# Patient Record
Sex: Male | Born: 1940 | Race: Black or African American | Hispanic: No | Smoking: Never smoker
Health system: Southern US, Community
[De-identification: ages and names within clinical notes are randomized; demographics above are authoritative.]

## PROBLEM LIST (undated history)

## (undated) DIAGNOSIS — I2510 Atherosclerotic heart disease of native coronary artery without angina pectoris: Secondary | ICD-10-CM

## (undated) DIAGNOSIS — E119 Type 2 diabetes mellitus without complications: Secondary | ICD-10-CM

## (undated) DIAGNOSIS — I4510 Unspecified right bundle-branch block: Secondary | ICD-10-CM

## (undated) DIAGNOSIS — I10 Essential (primary) hypertension: Secondary | ICD-10-CM

## (undated) DIAGNOSIS — E785 Hyperlipidemia, unspecified: Secondary | ICD-10-CM

## (undated) DIAGNOSIS — K5790 Diverticulosis of intestine, part unspecified, without perforation or abscess without bleeding: Secondary | ICD-10-CM

## (undated) DIAGNOSIS — I509 Heart failure, unspecified: Secondary | ICD-10-CM

## (undated) DIAGNOSIS — K649 Unspecified hemorrhoids: Secondary | ICD-10-CM

## (undated) DIAGNOSIS — D471 Chronic myeloproliferative disease: Secondary | ICD-10-CM

## (undated) DIAGNOSIS — I739 Peripheral vascular disease, unspecified: Secondary | ICD-10-CM

## (undated) HISTORY — DX: Unspecified hemorrhoids: K64.9

## (undated) HISTORY — PX: VEIN SURGERY: SHX48

## (undated) HISTORY — DX: Heart failure, unspecified: I50.9

## (undated) HISTORY — DX: Peripheral vascular disease, unspecified: I73.9

## (undated) HISTORY — DX: Essential (primary) hypertension: I10

## (undated) HISTORY — DX: Diverticulosis of intestine, part unspecified, without perforation or abscess without bleeding: K57.90

## (undated) HISTORY — DX: Hyperlipidemia, unspecified: E78.5

## (undated) HISTORY — DX: Chronic myeloproliferative disease: D47.1

## (undated) HISTORY — DX: Type 2 diabetes mellitus without complications: E11.9

## (undated) HISTORY — DX: Atherosclerotic heart disease of native coronary artery without angina pectoris: I25.10

---

## 1998-10-08 HISTORY — PX: CORONARY ARTERY BYPASS GRAFT: SHX141

## 2010-10-27 ENCOUNTER — Emergency Department (HOSPITAL_COMMUNITY)
Admission: EM | Admit: 2010-10-27 | Discharge: 2010-10-27 | Payer: Self-pay | Source: Home / Self Care | Admitting: Emergency Medicine

## 2011-10-13 DIAGNOSIS — E1159 Type 2 diabetes mellitus with other circulatory complications: Secondary | ICD-10-CM | POA: Diagnosis not present

## 2011-10-13 DIAGNOSIS — L608 Other nail disorders: Secondary | ICD-10-CM | POA: Diagnosis not present

## 2011-10-13 DIAGNOSIS — B351 Tinea unguium: Secondary | ICD-10-CM | POA: Diagnosis not present

## 2011-10-22 DIAGNOSIS — G894 Chronic pain syndrome: Secondary | ICD-10-CM | POA: Diagnosis not present

## 2011-10-22 DIAGNOSIS — E119 Type 2 diabetes mellitus without complications: Secondary | ICD-10-CM | POA: Diagnosis not present

## 2011-10-22 DIAGNOSIS — E785 Hyperlipidemia, unspecified: Secondary | ICD-10-CM | POA: Diagnosis not present

## 2011-10-22 DIAGNOSIS — H40019 Open angle with borderline findings, low risk, unspecified eye: Secondary | ICD-10-CM | POA: Diagnosis not present

## 2011-10-22 DIAGNOSIS — K219 Gastro-esophageal reflux disease without esophagitis: Secondary | ICD-10-CM | POA: Diagnosis not present

## 2011-11-08 DIAGNOSIS — H1045 Other chronic allergic conjunctivitis: Secondary | ICD-10-CM | POA: Diagnosis not present

## 2011-11-19 DIAGNOSIS — G5790 Unspecified mononeuropathy of unspecified lower limb: Secondary | ICD-10-CM | POA: Diagnosis not present

## 2011-11-19 DIAGNOSIS — G609 Hereditary and idiopathic neuropathy, unspecified: Secondary | ICD-10-CM | POA: Diagnosis not present

## 2011-11-19 DIAGNOSIS — M79609 Pain in unspecified limb: Secondary | ICD-10-CM | POA: Diagnosis not present

## 2011-12-15 DIAGNOSIS — B351 Tinea unguium: Secondary | ICD-10-CM | POA: Diagnosis not present

## 2011-12-15 DIAGNOSIS — E1159 Type 2 diabetes mellitus with other circulatory complications: Secondary | ICD-10-CM | POA: Diagnosis not present

## 2011-12-15 DIAGNOSIS — L608 Other nail disorders: Secondary | ICD-10-CM | POA: Diagnosis not present

## 2011-12-28 DIAGNOSIS — J309 Allergic rhinitis, unspecified: Secondary | ICD-10-CM | POA: Diagnosis not present

## 2011-12-28 DIAGNOSIS — E559 Vitamin D deficiency, unspecified: Secondary | ICD-10-CM | POA: Diagnosis not present

## 2011-12-28 DIAGNOSIS — I10 Essential (primary) hypertension: Secondary | ICD-10-CM | POA: Diagnosis not present

## 2011-12-28 DIAGNOSIS — I2510 Atherosclerotic heart disease of native coronary artery without angina pectoris: Secondary | ICD-10-CM | POA: Diagnosis not present

## 2011-12-28 DIAGNOSIS — E119 Type 2 diabetes mellitus without complications: Secondary | ICD-10-CM | POA: Diagnosis not present

## 2012-02-16 DIAGNOSIS — L608 Other nail disorders: Secondary | ICD-10-CM | POA: Diagnosis not present

## 2012-02-16 DIAGNOSIS — B351 Tinea unguium: Secondary | ICD-10-CM | POA: Diagnosis not present

## 2012-02-16 DIAGNOSIS — E1159 Type 2 diabetes mellitus with other circulatory complications: Secondary | ICD-10-CM | POA: Diagnosis not present

## 2012-03-10 DIAGNOSIS — E119 Type 2 diabetes mellitus without complications: Secondary | ICD-10-CM | POA: Diagnosis not present

## 2012-03-10 DIAGNOSIS — E785 Hyperlipidemia, unspecified: Secondary | ICD-10-CM | POA: Diagnosis not present

## 2012-03-10 DIAGNOSIS — I10 Essential (primary) hypertension: Secondary | ICD-10-CM | POA: Diagnosis not present

## 2012-03-10 DIAGNOSIS — K219 Gastro-esophageal reflux disease without esophagitis: Secondary | ICD-10-CM | POA: Diagnosis not present

## 2012-04-09 DIAGNOSIS — I2510 Atherosclerotic heart disease of native coronary artery without angina pectoris: Secondary | ICD-10-CM | POA: Diagnosis not present

## 2012-04-09 DIAGNOSIS — I10 Essential (primary) hypertension: Secondary | ICD-10-CM | POA: Diagnosis not present

## 2012-04-09 DIAGNOSIS — E119 Type 2 diabetes mellitus without complications: Secondary | ICD-10-CM | POA: Diagnosis not present

## 2012-04-09 DIAGNOSIS — E785 Hyperlipidemia, unspecified: Secondary | ICD-10-CM | POA: Diagnosis not present

## 2012-04-19 DIAGNOSIS — B351 Tinea unguium: Secondary | ICD-10-CM | POA: Diagnosis not present

## 2012-04-19 DIAGNOSIS — E1159 Type 2 diabetes mellitus with other circulatory complications: Secondary | ICD-10-CM | POA: Diagnosis not present

## 2012-04-19 DIAGNOSIS — L608 Other nail disorders: Secondary | ICD-10-CM | POA: Diagnosis not present

## 2012-04-22 DIAGNOSIS — I8310 Varicose veins of unspecified lower extremity with inflammation: Secondary | ICD-10-CM | POA: Diagnosis not present

## 2012-04-22 DIAGNOSIS — I872 Venous insufficiency (chronic) (peripheral): Secondary | ICD-10-CM | POA: Diagnosis not present

## 2012-04-22 DIAGNOSIS — M79609 Pain in unspecified limb: Secondary | ICD-10-CM | POA: Diagnosis not present

## 2012-04-22 DIAGNOSIS — I87309 Chronic venous hypertension (idiopathic) without complications of unspecified lower extremity: Secondary | ICD-10-CM | POA: Diagnosis not present

## 2012-04-22 DIAGNOSIS — M7989 Other specified soft tissue disorders: Secondary | ICD-10-CM | POA: Diagnosis not present

## 2012-06-10 DIAGNOSIS — I10 Essential (primary) hypertension: Secondary | ICD-10-CM | POA: Diagnosis not present

## 2012-06-10 DIAGNOSIS — R109 Unspecified abdominal pain: Secondary | ICD-10-CM | POA: Diagnosis not present

## 2012-06-10 DIAGNOSIS — E119 Type 2 diabetes mellitus without complications: Secondary | ICD-10-CM | POA: Diagnosis not present

## 2012-06-10 DIAGNOSIS — Z79899 Other long term (current) drug therapy: Secondary | ICD-10-CM | POA: Diagnosis not present

## 2012-06-10 DIAGNOSIS — R112 Nausea with vomiting, unspecified: Secondary | ICD-10-CM | POA: Diagnosis not present

## 2012-06-10 DIAGNOSIS — K625 Hemorrhage of anus and rectum: Secondary | ICD-10-CM | POA: Diagnosis not present

## 2012-06-10 DIAGNOSIS — D45 Polycythemia vera: Secondary | ICD-10-CM | POA: Diagnosis not present

## 2012-06-10 DIAGNOSIS — Z8711 Personal history of peptic ulcer disease: Secondary | ICD-10-CM | POA: Diagnosis not present

## 2012-06-10 DIAGNOSIS — Z951 Presence of aortocoronary bypass graft: Secondary | ICD-10-CM | POA: Diagnosis not present

## 2012-06-10 DIAGNOSIS — K922 Gastrointestinal hemorrhage, unspecified: Secondary | ICD-10-CM | POA: Diagnosis not present

## 2012-06-13 DIAGNOSIS — E119 Type 2 diabetes mellitus without complications: Secondary | ICD-10-CM | POA: Diagnosis not present

## 2012-06-13 DIAGNOSIS — E785 Hyperlipidemia, unspecified: Secondary | ICD-10-CM | POA: Diagnosis not present

## 2012-06-13 DIAGNOSIS — D538 Other specified nutritional anemias: Secondary | ICD-10-CM | POA: Diagnosis not present

## 2012-06-13 DIAGNOSIS — I2510 Atherosclerotic heart disease of native coronary artery without angina pectoris: Secondary | ICD-10-CM | POA: Diagnosis not present

## 2012-06-17 DIAGNOSIS — D509 Iron deficiency anemia, unspecified: Secondary | ICD-10-CM | POA: Diagnosis not present

## 2012-06-17 DIAGNOSIS — K922 Gastrointestinal hemorrhage, unspecified: Secondary | ICD-10-CM | POA: Diagnosis not present

## 2012-06-17 DIAGNOSIS — Z1211 Encounter for screening for malignant neoplasm of colon: Secondary | ICD-10-CM | POA: Diagnosis not present

## 2012-06-17 DIAGNOSIS — D500 Iron deficiency anemia secondary to blood loss (chronic): Secondary | ICD-10-CM | POA: Diagnosis not present

## 2012-06-19 DIAGNOSIS — Z951 Presence of aortocoronary bypass graft: Secondary | ICD-10-CM | POA: Diagnosis not present

## 2012-06-19 DIAGNOSIS — K259 Gastric ulcer, unspecified as acute or chronic, without hemorrhage or perforation: Secondary | ICD-10-CM | POA: Diagnosis not present

## 2012-06-19 DIAGNOSIS — I10 Essential (primary) hypertension: Secondary | ICD-10-CM | POA: Diagnosis not present

## 2012-06-19 DIAGNOSIS — D649 Anemia, unspecified: Secondary | ICD-10-CM | POA: Diagnosis not present

## 2012-06-19 DIAGNOSIS — K649 Unspecified hemorrhoids: Secondary | ICD-10-CM | POA: Diagnosis not present

## 2012-06-19 DIAGNOSIS — I739 Peripheral vascular disease, unspecified: Secondary | ICD-10-CM | POA: Diagnosis not present

## 2012-06-19 DIAGNOSIS — A048 Other specified bacterial intestinal infections: Secondary | ICD-10-CM | POA: Diagnosis not present

## 2012-06-19 DIAGNOSIS — K921 Melena: Secondary | ICD-10-CM | POA: Diagnosis not present

## 2012-06-19 DIAGNOSIS — K5730 Diverticulosis of large intestine without perforation or abscess without bleeding: Secondary | ICD-10-CM | POA: Diagnosis not present

## 2012-06-19 DIAGNOSIS — Z794 Long term (current) use of insulin: Secondary | ICD-10-CM | POA: Diagnosis not present

## 2012-06-19 DIAGNOSIS — K260 Acute duodenal ulcer with hemorrhage: Secondary | ICD-10-CM | POA: Diagnosis not present

## 2012-06-19 DIAGNOSIS — K2940 Chronic atrophic gastritis without bleeding: Secondary | ICD-10-CM | POA: Diagnosis not present

## 2012-06-19 DIAGNOSIS — I2510 Atherosclerotic heart disease of native coronary artery without angina pectoris: Secondary | ICD-10-CM | POA: Diagnosis not present

## 2012-06-19 DIAGNOSIS — E119 Type 2 diabetes mellitus without complications: Secondary | ICD-10-CM | POA: Diagnosis not present

## 2012-06-21 DIAGNOSIS — E1159 Type 2 diabetes mellitus with other circulatory complications: Secondary | ICD-10-CM | POA: Diagnosis not present

## 2012-06-21 DIAGNOSIS — L608 Other nail disorders: Secondary | ICD-10-CM | POA: Diagnosis not present

## 2012-06-21 DIAGNOSIS — B351 Tinea unguium: Secondary | ICD-10-CM | POA: Diagnosis not present

## 2012-06-24 DIAGNOSIS — K922 Gastrointestinal hemorrhage, unspecified: Secondary | ICD-10-CM | POA: Diagnosis not present

## 2012-06-24 DIAGNOSIS — D500 Iron deficiency anemia secondary to blood loss (chronic): Secondary | ICD-10-CM | POA: Diagnosis not present

## 2012-06-24 DIAGNOSIS — Z1211 Encounter for screening for malignant neoplasm of colon: Secondary | ICD-10-CM | POA: Diagnosis not present

## 2012-07-10 DIAGNOSIS — I517 Cardiomegaly: Secondary | ICD-10-CM | POA: Diagnosis not present

## 2012-07-10 DIAGNOSIS — E119 Type 2 diabetes mellitus without complications: Secondary | ICD-10-CM | POA: Diagnosis not present

## 2012-07-10 DIAGNOSIS — E785 Hyperlipidemia, unspecified: Secondary | ICD-10-CM | POA: Diagnosis not present

## 2012-07-10 DIAGNOSIS — I10 Essential (primary) hypertension: Secondary | ICD-10-CM | POA: Diagnosis not present

## 2012-07-22 DIAGNOSIS — I872 Venous insufficiency (chronic) (peripheral): Secondary | ICD-10-CM | POA: Diagnosis not present

## 2012-07-22 DIAGNOSIS — I8310 Varicose veins of unspecified lower extremity with inflammation: Secondary | ICD-10-CM | POA: Diagnosis not present

## 2012-07-31 DIAGNOSIS — Z951 Presence of aortocoronary bypass graft: Secondary | ICD-10-CM | POA: Diagnosis not present

## 2012-07-31 DIAGNOSIS — I10 Essential (primary) hypertension: Secondary | ICD-10-CM | POA: Diagnosis not present

## 2012-07-31 DIAGNOSIS — K279 Peptic ulcer, site unspecified, unspecified as acute or chronic, without hemorrhage or perforation: Secondary | ICD-10-CM | POA: Diagnosis not present

## 2012-07-31 DIAGNOSIS — A048 Other specified bacterial intestinal infections: Secondary | ICD-10-CM | POA: Diagnosis not present

## 2012-07-31 DIAGNOSIS — E119 Type 2 diabetes mellitus without complications: Secondary | ICD-10-CM | POA: Diagnosis not present

## 2012-07-31 DIAGNOSIS — K2990 Gastroduodenitis, unspecified, without bleeding: Secondary | ICD-10-CM | POA: Diagnosis not present

## 2012-07-31 DIAGNOSIS — K277 Chronic peptic ulcer, site unspecified, without hemorrhage or perforation: Secondary | ICD-10-CM | POA: Diagnosis not present

## 2012-07-31 DIAGNOSIS — K2970 Gastritis, unspecified, without bleeding: Secondary | ICD-10-CM | POA: Diagnosis not present

## 2012-07-31 DIAGNOSIS — K2940 Chronic atrophic gastritis without bleeding: Secondary | ICD-10-CM | POA: Diagnosis not present

## 2012-08-07 DIAGNOSIS — E119 Type 2 diabetes mellitus without complications: Secondary | ICD-10-CM | POA: Diagnosis not present

## 2012-08-07 DIAGNOSIS — I739 Peripheral vascular disease, unspecified: Secondary | ICD-10-CM | POA: Diagnosis not present

## 2012-08-07 DIAGNOSIS — I10 Essential (primary) hypertension: Secondary | ICD-10-CM | POA: Diagnosis not present

## 2012-08-07 DIAGNOSIS — E785 Hyperlipidemia, unspecified: Secondary | ICD-10-CM | POA: Diagnosis not present

## 2012-08-23 DIAGNOSIS — E1159 Type 2 diabetes mellitus with other circulatory complications: Secondary | ICD-10-CM | POA: Diagnosis not present

## 2012-08-23 DIAGNOSIS — L608 Other nail disorders: Secondary | ICD-10-CM | POA: Diagnosis not present

## 2012-08-23 DIAGNOSIS — B351 Tinea unguium: Secondary | ICD-10-CM | POA: Diagnosis not present

## 2012-09-09 DIAGNOSIS — I83893 Varicose veins of bilateral lower extremities with other complications: Secondary | ICD-10-CM | POA: Diagnosis not present

## 2012-09-11 DIAGNOSIS — I83893 Varicose veins of bilateral lower extremities with other complications: Secondary | ICD-10-CM | POA: Diagnosis not present

## 2012-09-11 DIAGNOSIS — I872 Venous insufficiency (chronic) (peripheral): Secondary | ICD-10-CM | POA: Diagnosis not present

## 2012-09-11 DIAGNOSIS — I87309 Chronic venous hypertension (idiopathic) without complications of unspecified lower extremity: Secondary | ICD-10-CM | POA: Diagnosis not present

## 2012-09-16 DIAGNOSIS — I87309 Chronic venous hypertension (idiopathic) without complications of unspecified lower extremity: Secondary | ICD-10-CM | POA: Diagnosis not present

## 2012-09-16 DIAGNOSIS — I83893 Varicose veins of bilateral lower extremities with other complications: Secondary | ICD-10-CM | POA: Diagnosis not present

## 2012-09-23 DIAGNOSIS — R5381 Other malaise: Secondary | ICD-10-CM | POA: Diagnosis not present

## 2012-09-23 DIAGNOSIS — I10 Essential (primary) hypertension: Secondary | ICD-10-CM | POA: Diagnosis not present

## 2012-09-23 DIAGNOSIS — E785 Hyperlipidemia, unspecified: Secondary | ICD-10-CM | POA: Diagnosis not present

## 2012-09-23 DIAGNOSIS — E119 Type 2 diabetes mellitus without complications: Secondary | ICD-10-CM | POA: Diagnosis not present

## 2012-09-25 DIAGNOSIS — G4730 Sleep apnea, unspecified: Secondary | ICD-10-CM | POA: Diagnosis not present

## 2012-09-25 DIAGNOSIS — K2980 Duodenitis without bleeding: Secondary | ICD-10-CM | POA: Diagnosis not present

## 2012-09-25 DIAGNOSIS — I10 Essential (primary) hypertension: Secondary | ICD-10-CM | POA: Diagnosis not present

## 2012-09-25 DIAGNOSIS — K277 Chronic peptic ulcer, site unspecified, without hemorrhage or perforation: Secondary | ICD-10-CM | POA: Diagnosis not present

## 2012-09-25 DIAGNOSIS — I252 Old myocardial infarction: Secondary | ICD-10-CM | POA: Diagnosis not present

## 2012-09-25 DIAGNOSIS — A048 Other specified bacterial intestinal infections: Secondary | ICD-10-CM | POA: Diagnosis not present

## 2012-09-25 DIAGNOSIS — Z5181 Encounter for therapeutic drug level monitoring: Secondary | ICD-10-CM | POA: Diagnosis not present

## 2012-09-25 DIAGNOSIS — K279 Peptic ulcer, site unspecified, unspecified as acute or chronic, without hemorrhage or perforation: Secondary | ICD-10-CM | POA: Diagnosis not present

## 2012-09-25 DIAGNOSIS — K2940 Chronic atrophic gastritis without bleeding: Secondary | ICD-10-CM | POA: Diagnosis not present

## 2012-09-25 DIAGNOSIS — I872 Venous insufficiency (chronic) (peripheral): Secondary | ICD-10-CM | POA: Diagnosis not present

## 2012-09-25 DIAGNOSIS — I8310 Varicose veins of unspecified lower extremity with inflammation: Secondary | ICD-10-CM | POA: Diagnosis not present

## 2012-09-30 DIAGNOSIS — I87309 Chronic venous hypertension (idiopathic) without complications of unspecified lower extremity: Secondary | ICD-10-CM | POA: Diagnosis not present

## 2012-09-30 DIAGNOSIS — K277 Chronic peptic ulcer, site unspecified, without hemorrhage or perforation: Secondary | ICD-10-CM | POA: Diagnosis not present

## 2012-09-30 DIAGNOSIS — I83893 Varicose veins of bilateral lower extremities with other complications: Secondary | ICD-10-CM | POA: Diagnosis not present

## 2012-10-02 DIAGNOSIS — M79609 Pain in unspecified limb: Secondary | ICD-10-CM | POA: Diagnosis not present

## 2012-10-25 DIAGNOSIS — E1159 Type 2 diabetes mellitus with other circulatory complications: Secondary | ICD-10-CM | POA: Diagnosis not present

## 2012-10-25 DIAGNOSIS — B351 Tinea unguium: Secondary | ICD-10-CM | POA: Diagnosis not present

## 2012-10-25 DIAGNOSIS — L608 Other nail disorders: Secondary | ICD-10-CM | POA: Diagnosis not present

## 2012-10-26 DIAGNOSIS — E1149 Type 2 diabetes mellitus with other diabetic neurological complication: Secondary | ICD-10-CM | POA: Diagnosis not present

## 2012-10-26 DIAGNOSIS — G609 Hereditary and idiopathic neuropathy, unspecified: Secondary | ICD-10-CM | POA: Diagnosis not present

## 2012-10-26 DIAGNOSIS — Z951 Presence of aortocoronary bypass graft: Secondary | ICD-10-CM | POA: Diagnosis not present

## 2012-10-26 DIAGNOSIS — E119 Type 2 diabetes mellitus without complications: Secondary | ICD-10-CM | POA: Diagnosis not present

## 2012-10-26 DIAGNOSIS — E1142 Type 2 diabetes mellitus with diabetic polyneuropathy: Secondary | ICD-10-CM | POA: Diagnosis not present

## 2012-10-26 DIAGNOSIS — I10 Essential (primary) hypertension: Secondary | ICD-10-CM | POA: Diagnosis not present

## 2012-10-31 DIAGNOSIS — I739 Peripheral vascular disease, unspecified: Secondary | ICD-10-CM | POA: Diagnosis not present

## 2012-10-31 DIAGNOSIS — E119 Type 2 diabetes mellitus without complications: Secondary | ICD-10-CM | POA: Diagnosis not present

## 2012-10-31 DIAGNOSIS — E785 Hyperlipidemia, unspecified: Secondary | ICD-10-CM | POA: Diagnosis not present

## 2012-10-31 DIAGNOSIS — I10 Essential (primary) hypertension: Secondary | ICD-10-CM | POA: Diagnosis not present

## 2012-12-02 DIAGNOSIS — D649 Anemia, unspecified: Secondary | ICD-10-CM | POA: Diagnosis not present

## 2012-12-02 DIAGNOSIS — K277 Chronic peptic ulcer, site unspecified, without hemorrhage or perforation: Secondary | ICD-10-CM | POA: Diagnosis not present

## 2012-12-02 DIAGNOSIS — I872 Venous insufficiency (chronic) (peripheral): Secondary | ICD-10-CM | POA: Diagnosis not present

## 2012-12-02 DIAGNOSIS — M79609 Pain in unspecified limb: Secondary | ICD-10-CM | POA: Diagnosis not present

## 2012-12-02 DIAGNOSIS — I87309 Chronic venous hypertension (idiopathic) without complications of unspecified lower extremity: Secondary | ICD-10-CM | POA: Diagnosis not present

## 2012-12-02 DIAGNOSIS — I8310 Varicose veins of unspecified lower extremity with inflammation: Secondary | ICD-10-CM | POA: Diagnosis not present

## 2012-12-15 DIAGNOSIS — E119 Type 2 diabetes mellitus without complications: Secondary | ICD-10-CM | POA: Diagnosis not present

## 2012-12-15 DIAGNOSIS — I10 Essential (primary) hypertension: Secondary | ICD-10-CM | POA: Diagnosis not present

## 2012-12-15 DIAGNOSIS — E785 Hyperlipidemia, unspecified: Secondary | ICD-10-CM | POA: Diagnosis not present

## 2012-12-15 DIAGNOSIS — I509 Heart failure, unspecified: Secondary | ICD-10-CM | POA: Diagnosis not present

## 2012-12-18 DIAGNOSIS — K219 Gastro-esophageal reflux disease without esophagitis: Secondary | ICD-10-CM | POA: Diagnosis not present

## 2012-12-18 DIAGNOSIS — H04129 Dry eye syndrome of unspecified lacrimal gland: Secondary | ICD-10-CM | POA: Diagnosis not present

## 2012-12-18 DIAGNOSIS — K2970 Gastritis, unspecified, without bleeding: Secondary | ICD-10-CM | POA: Diagnosis not present

## 2012-12-18 DIAGNOSIS — Z951 Presence of aortocoronary bypass graft: Secondary | ICD-10-CM | POA: Diagnosis not present

## 2012-12-18 DIAGNOSIS — H40019 Open angle with borderline findings, low risk, unspecified eye: Secondary | ICD-10-CM | POA: Diagnosis not present

## 2012-12-18 DIAGNOSIS — I83893 Varicose veins of bilateral lower extremities with other complications: Secondary | ICD-10-CM | POA: Diagnosis not present

## 2012-12-18 DIAGNOSIS — I10 Essential (primary) hypertension: Secondary | ICD-10-CM | POA: Diagnosis not present

## 2012-12-18 DIAGNOSIS — I872 Venous insufficiency (chronic) (peripheral): Secondary | ICD-10-CM | POA: Diagnosis not present

## 2012-12-18 DIAGNOSIS — I252 Old myocardial infarction: Secondary | ICD-10-CM | POA: Diagnosis not present

## 2012-12-18 DIAGNOSIS — K277 Chronic peptic ulcer, site unspecified, without hemorrhage or perforation: Secondary | ICD-10-CM | POA: Diagnosis not present

## 2012-12-18 DIAGNOSIS — A048 Other specified bacterial intestinal infections: Secondary | ICD-10-CM | POA: Diagnosis not present

## 2012-12-18 DIAGNOSIS — Z5181 Encounter for therapeutic drug level monitoring: Secondary | ICD-10-CM | POA: Diagnosis not present

## 2012-12-18 DIAGNOSIS — K2900 Acute gastritis without bleeding: Secondary | ICD-10-CM | POA: Diagnosis not present

## 2012-12-18 DIAGNOSIS — G4730 Sleep apnea, unspecified: Secondary | ICD-10-CM | POA: Diagnosis not present

## 2012-12-18 DIAGNOSIS — E119 Type 2 diabetes mellitus without complications: Secondary | ICD-10-CM | POA: Diagnosis not present

## 2012-12-18 DIAGNOSIS — K2940 Chronic atrophic gastritis without bleeding: Secondary | ICD-10-CM | POA: Diagnosis not present

## 2012-12-18 DIAGNOSIS — Z794 Long term (current) use of insulin: Secondary | ICD-10-CM | POA: Diagnosis not present

## 2012-12-23 DIAGNOSIS — D649 Anemia, unspecified: Secondary | ICD-10-CM | POA: Diagnosis not present

## 2012-12-23 DIAGNOSIS — K277 Chronic peptic ulcer, site unspecified, without hemorrhage or perforation: Secondary | ICD-10-CM | POA: Diagnosis not present

## 2012-12-25 DIAGNOSIS — I87309 Chronic venous hypertension (idiopathic) without complications of unspecified lower extremity: Secondary | ICD-10-CM | POA: Diagnosis not present

## 2012-12-26 DIAGNOSIS — I10 Essential (primary) hypertension: Secondary | ICD-10-CM | POA: Diagnosis not present

## 2012-12-26 DIAGNOSIS — D750 Familial erythrocytosis: Secondary | ICD-10-CM | POA: Diagnosis not present

## 2012-12-26 DIAGNOSIS — D473 Essential (hemorrhagic) thrombocythemia: Secondary | ICD-10-CM | POA: Diagnosis not present

## 2012-12-26 DIAGNOSIS — D759 Disease of blood and blood-forming organs, unspecified: Secondary | ICD-10-CM | POA: Diagnosis not present

## 2012-12-26 DIAGNOSIS — D509 Iron deficiency anemia, unspecified: Secondary | ICD-10-CM | POA: Diagnosis not present

## 2012-12-26 DIAGNOSIS — D7581 Myelofibrosis: Secondary | ICD-10-CM | POA: Diagnosis not present

## 2012-12-26 DIAGNOSIS — D474 Osteomyelofibrosis: Secondary | ICD-10-CM | POA: Diagnosis not present

## 2012-12-26 DIAGNOSIS — D72829 Elevated white blood cell count, unspecified: Secondary | ICD-10-CM | POA: Diagnosis not present

## 2012-12-27 DIAGNOSIS — L608 Other nail disorders: Secondary | ICD-10-CM | POA: Diagnosis not present

## 2012-12-27 DIAGNOSIS — B351 Tinea unguium: Secondary | ICD-10-CM | POA: Diagnosis not present

## 2012-12-27 DIAGNOSIS — E1159 Type 2 diabetes mellitus with other circulatory complications: Secondary | ICD-10-CM | POA: Diagnosis not present

## 2013-01-02 DIAGNOSIS — I10 Essential (primary) hypertension: Secondary | ICD-10-CM | POA: Diagnosis not present

## 2013-01-02 DIAGNOSIS — D473 Essential (hemorrhagic) thrombocythemia: Secondary | ICD-10-CM | POA: Diagnosis not present

## 2013-01-02 DIAGNOSIS — D72829 Elevated white blood cell count, unspecified: Secondary | ICD-10-CM | POA: Diagnosis not present

## 2013-01-02 DIAGNOSIS — D750 Familial erythrocytosis: Secondary | ICD-10-CM | POA: Diagnosis not present

## 2013-01-02 DIAGNOSIS — R748 Abnormal levels of other serum enzymes: Secondary | ICD-10-CM | POA: Diagnosis not present

## 2013-02-02 DIAGNOSIS — D72829 Elevated white blood cell count, unspecified: Secondary | ICD-10-CM | POA: Diagnosis not present

## 2013-02-02 DIAGNOSIS — D473 Essential (hemorrhagic) thrombocythemia: Secondary | ICD-10-CM | POA: Diagnosis not present

## 2013-02-02 DIAGNOSIS — D750 Familial erythrocytosis: Secondary | ICD-10-CM | POA: Diagnosis not present

## 2013-02-03 DIAGNOSIS — D649 Anemia, unspecified: Secondary | ICD-10-CM | POA: Diagnosis not present

## 2013-02-05 HISTORY — PX: CARDIAC CATHETERIZATION: SHX172

## 2013-02-23 DIAGNOSIS — D750 Familial erythrocytosis: Secondary | ICD-10-CM | POA: Diagnosis not present

## 2013-02-23 DIAGNOSIS — D72829 Elevated white blood cell count, unspecified: Secondary | ICD-10-CM | POA: Diagnosis not present

## 2013-02-23 DIAGNOSIS — D473 Essential (hemorrhagic) thrombocythemia: Secondary | ICD-10-CM | POA: Diagnosis not present

## 2013-02-26 DIAGNOSIS — E119 Type 2 diabetes mellitus without complications: Secondary | ICD-10-CM | POA: Diagnosis not present

## 2013-02-26 DIAGNOSIS — I10 Essential (primary) hypertension: Secondary | ICD-10-CM | POA: Diagnosis not present

## 2013-02-26 DIAGNOSIS — I2510 Atherosclerotic heart disease of native coronary artery without angina pectoris: Secondary | ICD-10-CM | POA: Diagnosis not present

## 2013-02-26 DIAGNOSIS — K219 Gastro-esophageal reflux disease without esophagitis: Secondary | ICD-10-CM | POA: Diagnosis not present

## 2013-02-26 DIAGNOSIS — E785 Hyperlipidemia, unspecified: Secondary | ICD-10-CM | POA: Diagnosis not present

## 2013-03-01 DIAGNOSIS — R079 Chest pain, unspecified: Secondary | ICD-10-CM | POA: Diagnosis not present

## 2013-03-01 DIAGNOSIS — K2100 Gastro-esophageal reflux disease with esophagitis, without bleeding: Secondary | ICD-10-CM | POA: Diagnosis not present

## 2013-03-01 DIAGNOSIS — I10 Essential (primary) hypertension: Secondary | ICD-10-CM | POA: Diagnosis not present

## 2013-03-01 DIAGNOSIS — I509 Heart failure, unspecified: Secondary | ICD-10-CM | POA: Diagnosis not present

## 2013-03-01 DIAGNOSIS — E119 Type 2 diabetes mellitus without complications: Secondary | ICD-10-CM | POA: Diagnosis not present

## 2013-03-01 DIAGNOSIS — I428 Other cardiomyopathies: Secondary | ICD-10-CM | POA: Diagnosis not present

## 2013-03-01 DIAGNOSIS — I25810 Atherosclerosis of coronary artery bypass graft(s) without angina pectoris: Secondary | ICD-10-CM | POA: Diagnosis not present

## 2013-03-01 DIAGNOSIS — D696 Thrombocytopenia, unspecified: Secondary | ICD-10-CM | POA: Diagnosis not present

## 2013-03-01 DIAGNOSIS — I2582 Chronic total occlusion of coronary artery: Secondary | ICD-10-CM | POA: Diagnosis not present

## 2013-03-01 DIAGNOSIS — I2510 Atherosclerotic heart disease of native coronary artery without angina pectoris: Secondary | ICD-10-CM | POA: Diagnosis not present

## 2013-03-01 DIAGNOSIS — IMO0001 Reserved for inherently not codable concepts without codable children: Secondary | ICD-10-CM | POA: Diagnosis not present

## 2013-03-02 DIAGNOSIS — K2100 Gastro-esophageal reflux disease with esophagitis, without bleeding: Secondary | ICD-10-CM | POA: Diagnosis not present

## 2013-03-02 DIAGNOSIS — I509 Heart failure, unspecified: Secondary | ICD-10-CM | POA: Diagnosis not present

## 2013-03-02 DIAGNOSIS — I10 Essential (primary) hypertension: Secondary | ICD-10-CM | POA: Diagnosis not present

## 2013-03-02 DIAGNOSIS — E119 Type 2 diabetes mellitus without complications: Secondary | ICD-10-CM | POA: Diagnosis not present

## 2013-03-03 DIAGNOSIS — I10 Essential (primary) hypertension: Secondary | ICD-10-CM | POA: Diagnosis not present

## 2013-03-03 DIAGNOSIS — I119 Hypertensive heart disease without heart failure: Secondary | ICD-10-CM | POA: Diagnosis present

## 2013-03-03 DIAGNOSIS — I25810 Atherosclerosis of coronary artery bypass graft(s) without angina pectoris: Secondary | ICD-10-CM | POA: Diagnosis not present

## 2013-03-03 DIAGNOSIS — I200 Unstable angina: Secondary | ICD-10-CM | POA: Diagnosis not present

## 2013-03-03 DIAGNOSIS — D696 Thrombocytopenia, unspecified: Secondary | ICD-10-CM | POA: Diagnosis not present

## 2013-03-03 DIAGNOSIS — Z794 Long term (current) use of insulin: Secondary | ICD-10-CM | POA: Diagnosis not present

## 2013-03-03 DIAGNOSIS — E785 Hyperlipidemia, unspecified: Secondary | ICD-10-CM | POA: Diagnosis not present

## 2013-03-03 DIAGNOSIS — R079 Chest pain, unspecified: Secondary | ICD-10-CM | POA: Diagnosis not present

## 2013-03-03 DIAGNOSIS — I252 Old myocardial infarction: Secondary | ICD-10-CM | POA: Diagnosis not present

## 2013-03-03 DIAGNOSIS — I2582 Chronic total occlusion of coronary artery: Secondary | ICD-10-CM | POA: Diagnosis not present

## 2013-03-03 DIAGNOSIS — IMO0001 Reserved for inherently not codable concepts without codable children: Secondary | ICD-10-CM | POA: Diagnosis not present

## 2013-03-03 DIAGNOSIS — I2510 Atherosclerotic heart disease of native coronary artery without angina pectoris: Secondary | ICD-10-CM | POA: Diagnosis not present

## 2013-03-03 DIAGNOSIS — E119 Type 2 diabetes mellitus without complications: Secondary | ICD-10-CM | POA: Diagnosis not present

## 2013-03-03 DIAGNOSIS — I4510 Unspecified right bundle-branch block: Secondary | ICD-10-CM | POA: Diagnosis present

## 2013-03-03 DIAGNOSIS — K921 Melena: Secondary | ICD-10-CM | POA: Diagnosis not present

## 2013-03-03 DIAGNOSIS — R072 Precordial pain: Secondary | ICD-10-CM | POA: Diagnosis not present

## 2013-03-03 DIAGNOSIS — K2100 Gastro-esophageal reflux disease with esophagitis, without bleeding: Secondary | ICD-10-CM | POA: Diagnosis not present

## 2013-03-03 DIAGNOSIS — I509 Heart failure, unspecified: Secondary | ICD-10-CM | POA: Diagnosis not present

## 2013-03-07 DIAGNOSIS — L608 Other nail disorders: Secondary | ICD-10-CM | POA: Diagnosis not present

## 2013-03-07 DIAGNOSIS — E1159 Type 2 diabetes mellitus with other circulatory complications: Secondary | ICD-10-CM | POA: Diagnosis not present

## 2013-03-07 DIAGNOSIS — B351 Tinea unguium: Secondary | ICD-10-CM | POA: Diagnosis not present

## 2013-03-17 DIAGNOSIS — R0602 Shortness of breath: Secondary | ICD-10-CM | POA: Diagnosis not present

## 2013-03-19 DIAGNOSIS — D473 Essential (hemorrhagic) thrombocythemia: Secondary | ICD-10-CM | POA: Diagnosis not present

## 2013-03-19 DIAGNOSIS — D72829 Elevated white blood cell count, unspecified: Secondary | ICD-10-CM | POA: Diagnosis not present

## 2013-03-19 DIAGNOSIS — D750 Familial erythrocytosis: Secondary | ICD-10-CM | POA: Diagnosis not present

## 2013-03-20 DIAGNOSIS — I2510 Atherosclerotic heart disease of native coronary artery without angina pectoris: Secondary | ICD-10-CM | POA: Diagnosis not present

## 2013-03-20 DIAGNOSIS — E782 Mixed hyperlipidemia: Secondary | ICD-10-CM | POA: Diagnosis not present

## 2013-03-20 DIAGNOSIS — Z9861 Coronary angioplasty status: Secondary | ICD-10-CM | POA: Diagnosis not present

## 2013-03-20 DIAGNOSIS — I119 Hypertensive heart disease without heart failure: Secondary | ICD-10-CM | POA: Diagnosis not present

## 2013-04-09 DIAGNOSIS — I798 Other disorders of arteries, arterioles and capillaries in diseases classified elsewhere: Secondary | ICD-10-CM | POA: Diagnosis not present

## 2013-04-09 DIAGNOSIS — M79609 Pain in unspecified limb: Secondary | ICD-10-CM | POA: Diagnosis not present

## 2013-04-09 DIAGNOSIS — E1159 Type 2 diabetes mellitus with other circulatory complications: Secondary | ICD-10-CM | POA: Diagnosis not present

## 2013-04-09 DIAGNOSIS — Z01810 Encounter for preprocedural cardiovascular examination: Secondary | ICD-10-CM | POA: Diagnosis not present

## 2013-04-09 DIAGNOSIS — I739 Peripheral vascular disease, unspecified: Secondary | ICD-10-CM | POA: Diagnosis not present

## 2013-04-09 DIAGNOSIS — I2510 Atherosclerotic heart disease of native coronary artery without angina pectoris: Secondary | ICD-10-CM | POA: Diagnosis not present

## 2013-04-09 DIAGNOSIS — I70219 Atherosclerosis of native arteries of extremities with intermittent claudication, unspecified extremity: Secondary | ICD-10-CM | POA: Diagnosis not present

## 2013-04-14 DIAGNOSIS — D750 Familial erythrocytosis: Secondary | ICD-10-CM | POA: Diagnosis not present

## 2013-04-14 DIAGNOSIS — D473 Essential (hemorrhagic) thrombocythemia: Secondary | ICD-10-CM | POA: Diagnosis not present

## 2013-04-14 DIAGNOSIS — D72829 Elevated white blood cell count, unspecified: Secondary | ICD-10-CM | POA: Diagnosis not present

## 2013-04-15 DIAGNOSIS — I119 Hypertensive heart disease without heart failure: Secondary | ICD-10-CM | POA: Diagnosis not present

## 2013-04-15 DIAGNOSIS — R0989 Other specified symptoms and signs involving the circulatory and respiratory systems: Secondary | ICD-10-CM | POA: Diagnosis not present

## 2013-04-15 DIAGNOSIS — I2510 Atherosclerotic heart disease of native coronary artery without angina pectoris: Secondary | ICD-10-CM | POA: Diagnosis not present

## 2013-04-15 DIAGNOSIS — R0609 Other forms of dyspnea: Secondary | ICD-10-CM | POA: Diagnosis not present

## 2013-04-15 DIAGNOSIS — R072 Precordial pain: Secondary | ICD-10-CM | POA: Diagnosis not present

## 2013-04-15 DIAGNOSIS — Z9861 Coronary angioplasty status: Secondary | ICD-10-CM | POA: Diagnosis not present

## 2013-04-21 DIAGNOSIS — I209 Angina pectoris, unspecified: Secondary | ICD-10-CM | POA: Diagnosis not present

## 2013-04-22 DIAGNOSIS — Z01810 Encounter for preprocedural cardiovascular examination: Secondary | ICD-10-CM | POA: Diagnosis not present

## 2013-04-27 HISTORY — PX: PERCUTANEOUS CORONARY STENT INTERVENTION (PCI-S): SHX6016

## 2013-04-30 DIAGNOSIS — E119 Type 2 diabetes mellitus without complications: Secondary | ICD-10-CM | POA: Diagnosis not present

## 2013-04-30 DIAGNOSIS — I4510 Unspecified right bundle-branch block: Secondary | ICD-10-CM | POA: Diagnosis not present

## 2013-04-30 DIAGNOSIS — E669 Obesity, unspecified: Secondary | ICD-10-CM | POA: Diagnosis not present

## 2013-04-30 DIAGNOSIS — I2510 Atherosclerotic heart disease of native coronary artery without angina pectoris: Secondary | ICD-10-CM | POA: Diagnosis not present

## 2013-04-30 DIAGNOSIS — I25810 Atherosclerosis of coronary artery bypass graft(s) without angina pectoris: Secondary | ICD-10-CM | POA: Diagnosis not present

## 2013-04-30 DIAGNOSIS — I10 Essential (primary) hypertension: Secondary | ICD-10-CM | POA: Diagnosis not present

## 2013-05-01 DIAGNOSIS — I4510 Unspecified right bundle-branch block: Secondary | ICD-10-CM | POA: Diagnosis not present

## 2013-05-01 DIAGNOSIS — I25810 Atherosclerosis of coronary artery bypass graft(s) without angina pectoris: Secondary | ICD-10-CM | POA: Diagnosis not present

## 2013-05-01 DIAGNOSIS — E669 Obesity, unspecified: Secondary | ICD-10-CM | POA: Diagnosis not present

## 2013-05-01 DIAGNOSIS — E119 Type 2 diabetes mellitus without complications: Secondary | ICD-10-CM | POA: Diagnosis not present

## 2013-05-01 DIAGNOSIS — I10 Essential (primary) hypertension: Secondary | ICD-10-CM | POA: Diagnosis not present

## 2013-05-05 DIAGNOSIS — D473 Essential (hemorrhagic) thrombocythemia: Secondary | ICD-10-CM | POA: Diagnosis not present

## 2013-05-05 DIAGNOSIS — D72829 Elevated white blood cell count, unspecified: Secondary | ICD-10-CM | POA: Diagnosis not present

## 2013-05-05 DIAGNOSIS — D750 Familial erythrocytosis: Secondary | ICD-10-CM | POA: Diagnosis not present

## 2013-05-06 DIAGNOSIS — I6529 Occlusion and stenosis of unspecified carotid artery: Secondary | ICD-10-CM | POA: Diagnosis not present

## 2013-05-06 DIAGNOSIS — M79609 Pain in unspecified limb: Secondary | ICD-10-CM | POA: Diagnosis not present

## 2013-05-06 DIAGNOSIS — I70219 Atherosclerosis of native arteries of extremities with intermittent claudication, unspecified extremity: Secondary | ICD-10-CM | POA: Diagnosis not present

## 2013-05-06 DIAGNOSIS — I739 Peripheral vascular disease, unspecified: Secondary | ICD-10-CM | POA: Diagnosis not present

## 2013-05-08 DIAGNOSIS — I2510 Atherosclerotic heart disease of native coronary artery without angina pectoris: Secondary | ICD-10-CM | POA: Diagnosis not present

## 2013-05-08 DIAGNOSIS — Z9861 Coronary angioplasty status: Secondary | ICD-10-CM | POA: Diagnosis not present

## 2013-05-08 DIAGNOSIS — E782 Mixed hyperlipidemia: Secondary | ICD-10-CM | POA: Diagnosis not present

## 2013-05-09 DIAGNOSIS — L608 Other nail disorders: Secondary | ICD-10-CM | POA: Diagnosis not present

## 2013-05-09 DIAGNOSIS — B351 Tinea unguium: Secondary | ICD-10-CM | POA: Diagnosis not present

## 2013-05-09 DIAGNOSIS — E1159 Type 2 diabetes mellitus with other circulatory complications: Secondary | ICD-10-CM | POA: Diagnosis not present

## 2013-05-27 DIAGNOSIS — D72829 Elevated white blood cell count, unspecified: Secondary | ICD-10-CM | POA: Diagnosis not present

## 2013-05-27 DIAGNOSIS — D750 Familial erythrocytosis: Secondary | ICD-10-CM | POA: Diagnosis not present

## 2013-05-27 DIAGNOSIS — D759 Disease of blood and blood-forming organs, unspecified: Secondary | ICD-10-CM | POA: Diagnosis not present

## 2013-05-27 DIAGNOSIS — D473 Essential (hemorrhagic) thrombocythemia: Secondary | ICD-10-CM | POA: Diagnosis not present

## 2013-05-28 DIAGNOSIS — E119 Type 2 diabetes mellitus without complications: Secondary | ICD-10-CM | POA: Diagnosis not present

## 2013-05-28 DIAGNOSIS — I200 Unstable angina: Secondary | ICD-10-CM | POA: Diagnosis not present

## 2013-05-28 DIAGNOSIS — T82897A Other specified complication of cardiac prosthetic devices, implants and grafts, initial encounter: Secondary | ICD-10-CM | POA: Diagnosis not present

## 2013-05-28 DIAGNOSIS — I2510 Atherosclerotic heart disease of native coronary artery without angina pectoris: Secondary | ICD-10-CM | POA: Diagnosis not present

## 2013-05-28 DIAGNOSIS — I2582 Chronic total occlusion of coronary artery: Secondary | ICD-10-CM | POA: Diagnosis not present

## 2013-05-28 DIAGNOSIS — I119 Hypertensive heart disease without heart failure: Secondary | ICD-10-CM | POA: Diagnosis not present

## 2013-05-28 DIAGNOSIS — G909 Disorder of the autonomic nervous system, unspecified: Secondary | ICD-10-CM | POA: Diagnosis not present

## 2013-05-28 DIAGNOSIS — R0789 Other chest pain: Secondary | ICD-10-CM | POA: Diagnosis not present

## 2013-05-28 DIAGNOSIS — R079 Chest pain, unspecified: Secondary | ICD-10-CM | POA: Diagnosis not present

## 2013-05-28 DIAGNOSIS — I10 Essential (primary) hypertension: Secondary | ICD-10-CM | POA: Diagnosis not present

## 2013-05-29 DIAGNOSIS — I200 Unstable angina: Secondary | ICD-10-CM | POA: Diagnosis not present

## 2013-05-29 DIAGNOSIS — I739 Peripheral vascular disease, unspecified: Secondary | ICD-10-CM | POA: Diagnosis present

## 2013-05-29 DIAGNOSIS — I2582 Chronic total occlusion of coronary artery: Secondary | ICD-10-CM | POA: Diagnosis not present

## 2013-05-29 DIAGNOSIS — I2510 Atherosclerotic heart disease of native coronary artery without angina pectoris: Secondary | ICD-10-CM | POA: Diagnosis not present

## 2013-05-29 DIAGNOSIS — R079 Chest pain, unspecified: Secondary | ICD-10-CM | POA: Diagnosis not present

## 2013-05-29 DIAGNOSIS — Z9861 Coronary angioplasty status: Secondary | ICD-10-CM | POA: Diagnosis not present

## 2013-05-29 DIAGNOSIS — Z951 Presence of aortocoronary bypass graft: Secondary | ICD-10-CM | POA: Diagnosis not present

## 2013-05-29 DIAGNOSIS — T82897A Other specified complication of cardiac prosthetic devices, implants and grafts, initial encounter: Secondary | ICD-10-CM | POA: Diagnosis not present

## 2013-05-29 DIAGNOSIS — Z794 Long term (current) use of insulin: Secondary | ICD-10-CM | POA: Diagnosis not present

## 2013-05-29 DIAGNOSIS — I119 Hypertensive heart disease without heart failure: Secondary | ICD-10-CM | POA: Diagnosis not present

## 2013-05-29 DIAGNOSIS — R0789 Other chest pain: Secondary | ICD-10-CM | POA: Diagnosis not present

## 2013-05-29 DIAGNOSIS — E119 Type 2 diabetes mellitus without complications: Secondary | ICD-10-CM | POA: Diagnosis not present

## 2013-05-29 DIAGNOSIS — I10 Essential (primary) hypertension: Secondary | ICD-10-CM | POA: Diagnosis not present

## 2013-05-29 DIAGNOSIS — D638 Anemia in other chronic diseases classified elsewhere: Secondary | ICD-10-CM | POA: Diagnosis not present

## 2013-05-29 DIAGNOSIS — G909 Disorder of the autonomic nervous system, unspecified: Secondary | ICD-10-CM | POA: Diagnosis not present

## 2013-05-29 DIAGNOSIS — K5730 Diverticulosis of large intestine without perforation or abscess without bleeding: Secondary | ICD-10-CM | POA: Diagnosis present

## 2013-06-10 DIAGNOSIS — K648 Other hemorrhoids: Secondary | ICD-10-CM | POA: Diagnosis not present

## 2013-06-10 DIAGNOSIS — K922 Gastrointestinal hemorrhage, unspecified: Secondary | ICD-10-CM | POA: Diagnosis not present

## 2013-06-10 DIAGNOSIS — K5730 Diverticulosis of large intestine without perforation or abscess without bleeding: Secondary | ICD-10-CM | POA: Diagnosis not present

## 2013-06-10 DIAGNOSIS — K649 Unspecified hemorrhoids: Secondary | ICD-10-CM | POA: Diagnosis not present

## 2013-06-10 DIAGNOSIS — I10 Essential (primary) hypertension: Secondary | ICD-10-CM | POA: Diagnosis not present

## 2013-06-10 DIAGNOSIS — K219 Gastro-esophageal reflux disease without esophagitis: Secondary | ICD-10-CM | POA: Diagnosis not present

## 2013-06-12 DIAGNOSIS — E119 Type 2 diabetes mellitus without complications: Secondary | ICD-10-CM | POA: Diagnosis not present

## 2013-06-12 DIAGNOSIS — IMO0002 Reserved for concepts with insufficient information to code with codable children: Secondary | ICD-10-CM | POA: Diagnosis not present

## 2013-06-12 DIAGNOSIS — E1159 Type 2 diabetes mellitus with other circulatory complications: Secondary | ICD-10-CM | POA: Diagnosis not present

## 2013-06-12 DIAGNOSIS — D509 Iron deficiency anemia, unspecified: Secondary | ICD-10-CM | POA: Diagnosis not present

## 2013-06-12 DIAGNOSIS — E782 Mixed hyperlipidemia: Secondary | ICD-10-CM | POA: Diagnosis not present

## 2013-06-12 DIAGNOSIS — M79609 Pain in unspecified limb: Secondary | ICD-10-CM | POA: Diagnosis not present

## 2013-06-12 DIAGNOSIS — I2510 Atherosclerotic heart disease of native coronary artery without angina pectoris: Secondary | ICD-10-CM | POA: Diagnosis not present

## 2013-06-16 DIAGNOSIS — I70219 Atherosclerosis of native arteries of extremities with intermittent claudication, unspecified extremity: Secondary | ICD-10-CM | POA: Diagnosis not present

## 2013-06-16 DIAGNOSIS — I739 Peripheral vascular disease, unspecified: Secondary | ICD-10-CM | POA: Diagnosis not present

## 2013-06-16 DIAGNOSIS — M79609 Pain in unspecified limb: Secondary | ICD-10-CM | POA: Diagnosis not present

## 2013-06-17 DIAGNOSIS — D473 Essential (hemorrhagic) thrombocythemia: Secondary | ICD-10-CM | POA: Diagnosis not present

## 2013-06-17 DIAGNOSIS — D72829 Elevated white blood cell count, unspecified: Secondary | ICD-10-CM | POA: Diagnosis not present

## 2013-06-17 DIAGNOSIS — D750 Familial erythrocytosis: Secondary | ICD-10-CM | POA: Diagnosis not present

## 2013-07-01 DIAGNOSIS — Z01810 Encounter for preprocedural cardiovascular examination: Secondary | ICD-10-CM | POA: Diagnosis not present

## 2013-07-02 DIAGNOSIS — D72829 Elevated white blood cell count, unspecified: Secondary | ICD-10-CM | POA: Diagnosis not present

## 2013-07-02 DIAGNOSIS — D473 Essential (hemorrhagic) thrombocythemia: Secondary | ICD-10-CM | POA: Diagnosis not present

## 2013-07-02 DIAGNOSIS — D750 Familial erythrocytosis: Secondary | ICD-10-CM | POA: Diagnosis not present

## 2013-07-02 DIAGNOSIS — Z23 Encounter for immunization: Secondary | ICD-10-CM | POA: Diagnosis not present

## 2013-07-16 DIAGNOSIS — I2510 Atherosclerotic heart disease of native coronary artery without angina pectoris: Secondary | ICD-10-CM | POA: Diagnosis not present

## 2013-07-16 DIAGNOSIS — Z5181 Encounter for therapeutic drug level monitoring: Secondary | ICD-10-CM | POA: Diagnosis not present

## 2013-07-16 DIAGNOSIS — I70209 Unspecified atherosclerosis of native arteries of extremities, unspecified extremity: Secondary | ICD-10-CM | POA: Diagnosis not present

## 2013-07-16 DIAGNOSIS — I739 Peripheral vascular disease, unspecified: Secondary | ICD-10-CM | POA: Diagnosis not present

## 2013-07-16 DIAGNOSIS — E119 Type 2 diabetes mellitus without complications: Secondary | ICD-10-CM | POA: Diagnosis not present

## 2013-07-17 DIAGNOSIS — L608 Other nail disorders: Secondary | ICD-10-CM | POA: Diagnosis not present

## 2013-07-17 DIAGNOSIS — B351 Tinea unguium: Secondary | ICD-10-CM | POA: Diagnosis not present

## 2013-07-17 DIAGNOSIS — E1159 Type 2 diabetes mellitus with other circulatory complications: Secondary | ICD-10-CM | POA: Diagnosis not present

## 2013-07-20 DIAGNOSIS — D72829 Elevated white blood cell count, unspecified: Secondary | ICD-10-CM | POA: Diagnosis not present

## 2013-07-20 DIAGNOSIS — D473 Essential (hemorrhagic) thrombocythemia: Secondary | ICD-10-CM | POA: Diagnosis not present

## 2013-07-20 DIAGNOSIS — D750 Familial erythrocytosis: Secondary | ICD-10-CM | POA: Diagnosis not present

## 2013-07-21 DIAGNOSIS — I739 Peripheral vascular disease, unspecified: Secondary | ICD-10-CM | POA: Diagnosis not present

## 2013-07-21 DIAGNOSIS — I10 Essential (primary) hypertension: Secondary | ICD-10-CM | POA: Diagnosis not present

## 2013-07-21 DIAGNOSIS — E7800 Pure hypercholesterolemia, unspecified: Secondary | ICD-10-CM | POA: Diagnosis not present

## 2013-07-21 DIAGNOSIS — M79609 Pain in unspecified limb: Secondary | ICD-10-CM | POA: Diagnosis not present

## 2013-07-21 DIAGNOSIS — I70219 Atherosclerosis of native arteries of extremities with intermittent claudication, unspecified extremity: Secondary | ICD-10-CM | POA: Diagnosis not present

## 2013-07-29 DIAGNOSIS — I70219 Atherosclerosis of native arteries of extremities with intermittent claudication, unspecified extremity: Secondary | ICD-10-CM | POA: Diagnosis not present

## 2013-07-29 DIAGNOSIS — D509 Iron deficiency anemia, unspecified: Secondary | ICD-10-CM | POA: Diagnosis not present

## 2013-07-29 DIAGNOSIS — Z9861 Coronary angioplasty status: Secondary | ICD-10-CM | POA: Diagnosis not present

## 2013-07-29 DIAGNOSIS — I2510 Atherosclerotic heart disease of native coronary artery without angina pectoris: Secondary | ICD-10-CM | POA: Diagnosis not present

## 2013-08-12 DIAGNOSIS — D750 Familial erythrocytosis: Secondary | ICD-10-CM | POA: Diagnosis not present

## 2013-08-12 DIAGNOSIS — D473 Essential (hemorrhagic) thrombocythemia: Secondary | ICD-10-CM | POA: Diagnosis not present

## 2013-08-12 DIAGNOSIS — D72829 Elevated white blood cell count, unspecified: Secondary | ICD-10-CM | POA: Diagnosis not present

## 2013-08-26 DIAGNOSIS — I739 Peripheral vascular disease, unspecified: Secondary | ICD-10-CM | POA: Diagnosis not present

## 2013-08-26 DIAGNOSIS — I70219 Atherosclerosis of native arteries of extremities with intermittent claudication, unspecified extremity: Secondary | ICD-10-CM | POA: Diagnosis not present

## 2013-08-26 DIAGNOSIS — M79609 Pain in unspecified limb: Secondary | ICD-10-CM | POA: Diagnosis not present

## 2013-08-26 DIAGNOSIS — I6529 Occlusion and stenosis of unspecified carotid artery: Secondary | ICD-10-CM | POA: Diagnosis not present

## 2013-08-26 DIAGNOSIS — E7800 Pure hypercholesterolemia, unspecified: Secondary | ICD-10-CM | POA: Diagnosis not present

## 2013-09-14 DIAGNOSIS — D750 Familial erythrocytosis: Secondary | ICD-10-CM | POA: Diagnosis not present

## 2013-09-14 DIAGNOSIS — D473 Essential (hemorrhagic) thrombocythemia: Secondary | ICD-10-CM | POA: Diagnosis not present

## 2013-09-14 DIAGNOSIS — D72829 Elevated white blood cell count, unspecified: Secondary | ICD-10-CM | POA: Diagnosis not present

## 2013-09-18 DIAGNOSIS — L608 Other nail disorders: Secondary | ICD-10-CM | POA: Diagnosis not present

## 2013-09-18 DIAGNOSIS — B351 Tinea unguium: Secondary | ICD-10-CM | POA: Diagnosis not present

## 2013-09-18 DIAGNOSIS — E1159 Type 2 diabetes mellitus with other circulatory complications: Secondary | ICD-10-CM | POA: Diagnosis not present

## 2013-10-05 DIAGNOSIS — D750 Familial erythrocytosis: Secondary | ICD-10-CM | POA: Diagnosis not present

## 2013-10-05 DIAGNOSIS — D473 Essential (hemorrhagic) thrombocythemia: Secondary | ICD-10-CM | POA: Diagnosis not present

## 2013-10-05 DIAGNOSIS — D72829 Elevated white blood cell count, unspecified: Secondary | ICD-10-CM | POA: Diagnosis not present

## 2013-10-06 DIAGNOSIS — I2510 Atherosclerotic heart disease of native coronary artery without angina pectoris: Secondary | ICD-10-CM | POA: Diagnosis not present

## 2013-10-06 DIAGNOSIS — I119 Hypertensive heart disease without heart failure: Secondary | ICD-10-CM | POA: Diagnosis not present

## 2013-10-06 DIAGNOSIS — R072 Precordial pain: Secondary | ICD-10-CM | POA: Diagnosis not present

## 2013-10-06 DIAGNOSIS — E782 Mixed hyperlipidemia: Secondary | ICD-10-CM | POA: Diagnosis not present

## 2013-10-07 DIAGNOSIS — I798 Other disorders of arteries, arterioles and capillaries in diseases classified elsewhere: Secondary | ICD-10-CM | POA: Diagnosis not present

## 2013-10-07 DIAGNOSIS — M79609 Pain in unspecified limb: Secondary | ICD-10-CM | POA: Diagnosis not present

## 2013-10-07 DIAGNOSIS — I739 Peripheral vascular disease, unspecified: Secondary | ICD-10-CM | POA: Diagnosis not present

## 2013-10-07 DIAGNOSIS — E1159 Type 2 diabetes mellitus with other circulatory complications: Secondary | ICD-10-CM | POA: Diagnosis not present

## 2013-10-07 DIAGNOSIS — I70219 Atherosclerosis of native arteries of extremities with intermittent claudication, unspecified extremity: Secondary | ICD-10-CM | POA: Diagnosis not present

## 2013-10-27 DIAGNOSIS — D473 Essential (hemorrhagic) thrombocythemia: Secondary | ICD-10-CM | POA: Diagnosis not present

## 2013-10-27 DIAGNOSIS — D72829 Elevated white blood cell count, unspecified: Secondary | ICD-10-CM | POA: Diagnosis not present

## 2013-10-27 DIAGNOSIS — D750 Familial erythrocytosis: Secondary | ICD-10-CM | POA: Diagnosis not present

## 2013-10-28 DIAGNOSIS — I739 Peripheral vascular disease, unspecified: Secondary | ICD-10-CM | POA: Diagnosis not present

## 2013-11-11 ENCOUNTER — Ambulatory Visit: Payer: Self-pay | Admitting: Internal Medicine

## 2013-11-11 DIAGNOSIS — M79609 Pain in unspecified limb: Secondary | ICD-10-CM | POA: Diagnosis not present

## 2013-11-11 DIAGNOSIS — I798 Other disorders of arteries, arterioles and capillaries in diseases classified elsewhere: Secondary | ICD-10-CM | POA: Diagnosis not present

## 2013-11-11 DIAGNOSIS — I70219 Atherosclerosis of native arteries of extremities with intermittent claudication, unspecified extremity: Secondary | ICD-10-CM | POA: Diagnosis not present

## 2013-11-11 DIAGNOSIS — E1159 Type 2 diabetes mellitus with other circulatory complications: Secondary | ICD-10-CM | POA: Diagnosis not present

## 2013-11-12 ENCOUNTER — Telehealth: Payer: Self-pay | Admitting: Internal Medicine

## 2013-11-12 NOTE — Telephone Encounter (Signed)
NEW PATIENT SCHEDULED FOR 02/16 @ 1:30 W/DR. CHISM.  REFERRING [facility] DX- JAK2 POSITIVE MYELOFIBROSIS W/THROMBOSIS WELCOME PACKET MAILED.

## 2013-11-12 NOTE — Telephone Encounter (Signed)
C/D 11/12/13 for appt. 11/23/13

## 2013-11-21 DIAGNOSIS — B351 Tinea unguium: Secondary | ICD-10-CM | POA: Diagnosis not present

## 2013-11-21 DIAGNOSIS — E1159 Type 2 diabetes mellitus with other circulatory complications: Secondary | ICD-10-CM | POA: Diagnosis not present

## 2013-11-21 DIAGNOSIS — L608 Other nail disorders: Secondary | ICD-10-CM | POA: Diagnosis not present

## 2013-11-22 ENCOUNTER — Other Ambulatory Visit: Payer: Self-pay | Admitting: Internal Medicine

## 2013-11-22 ENCOUNTER — Encounter: Payer: Self-pay | Admitting: Internal Medicine

## 2013-11-22 DIAGNOSIS — R161 Splenomegaly, not elsewhere classified: Secondary | ICD-10-CM | POA: Insufficient documentation

## 2013-11-22 DIAGNOSIS — I739 Peripheral vascular disease, unspecified: Secondary | ICD-10-CM | POA: Insufficient documentation

## 2013-11-22 DIAGNOSIS — D7581 Myelofibrosis: Secondary | ICD-10-CM

## 2013-11-22 DIAGNOSIS — K5790 Diverticulosis of intestine, part unspecified, without perforation or abscess without bleeding: Secondary | ICD-10-CM | POA: Insufficient documentation

## 2013-11-22 DIAGNOSIS — E119 Type 2 diabetes mellitus without complications: Secondary | ICD-10-CM | POA: Insufficient documentation

## 2013-11-22 DIAGNOSIS — Z951 Presence of aortocoronary bypass graft: Secondary | ICD-10-CM | POA: Insufficient documentation

## 2013-11-22 DIAGNOSIS — K649 Unspecified hemorrhoids: Secondary | ICD-10-CM | POA: Insufficient documentation

## 2013-11-22 DIAGNOSIS — N281 Cyst of kidney, acquired: Secondary | ICD-10-CM | POA: Insufficient documentation

## 2013-11-22 DIAGNOSIS — I2510 Atherosclerotic heart disease of native coronary artery without angina pectoris: Secondary | ICD-10-CM | POA: Insufficient documentation

## 2013-11-23 ENCOUNTER — Encounter: Payer: Self-pay | Admitting: Internal Medicine

## 2013-11-23 ENCOUNTER — Other Ambulatory Visit (HOSPITAL_BASED_OUTPATIENT_CLINIC_OR_DEPARTMENT_OTHER): Payer: Medicare Other

## 2013-11-23 ENCOUNTER — Telehealth: Payer: Self-pay | Admitting: Internal Medicine

## 2013-11-23 ENCOUNTER — Ambulatory Visit: Payer: Self-pay

## 2013-11-23 ENCOUNTER — Ambulatory Visit (HOSPITAL_BASED_OUTPATIENT_CLINIC_OR_DEPARTMENT_OTHER): Payer: Medicare Other | Admitting: Internal Medicine

## 2013-11-23 ENCOUNTER — Other Ambulatory Visit: Payer: Self-pay

## 2013-11-23 ENCOUNTER — Ambulatory Visit (HOSPITAL_BASED_OUTPATIENT_CLINIC_OR_DEPARTMENT_OTHER): Payer: Medicare Other

## 2013-11-23 ENCOUNTER — Ambulatory Visit (INDEPENDENT_AMBULATORY_CARE_PROVIDER_SITE_OTHER): Payer: Medicare Other | Admitting: Internal Medicine

## 2013-11-23 VITALS — BP 120/76 | Ht 67.0 in | Wt 197.4 lb

## 2013-11-23 VITALS — BP 131/58 | HR 65 | Temp 96.7°F | Resp 18 | Ht 67.0 in | Wt 197.3 lb

## 2013-11-23 DIAGNOSIS — E785 Hyperlipidemia, unspecified: Secondary | ICD-10-CM | POA: Insufficient documentation

## 2013-11-23 DIAGNOSIS — R161 Splenomegaly, not elsewhere classified: Secondary | ICD-10-CM

## 2013-11-23 DIAGNOSIS — I10 Essential (primary) hypertension: Secondary | ICD-10-CM

## 2013-11-23 DIAGNOSIS — E119 Type 2 diabetes mellitus without complications: Secondary | ICD-10-CM

## 2013-11-23 DIAGNOSIS — D7581 Myelofibrosis: Secondary | ICD-10-CM

## 2013-11-23 DIAGNOSIS — D474 Osteomyelofibrosis: Secondary | ICD-10-CM

## 2013-11-23 DIAGNOSIS — I2510 Atherosclerotic heart disease of native coronary artery without angina pectoris: Secondary | ICD-10-CM

## 2013-11-23 DIAGNOSIS — Z951 Presence of aortocoronary bypass graft: Secondary | ICD-10-CM

## 2013-11-23 DIAGNOSIS — I739 Peripheral vascular disease, unspecified: Secondary | ICD-10-CM

## 2013-11-23 DIAGNOSIS — I25810 Atherosclerosis of coronary artery bypass graft(s) without angina pectoris: Secondary | ICD-10-CM

## 2013-11-23 LAB — CBC WITH DIFFERENTIAL/PLATELET
BASO%: 1.2 % (ref 0.0–2.0)
Basophils Absolute: 0.2 10*3/uL — ABNORMAL HIGH (ref 0.0–0.1)
EOS%: 4.4 % (ref 0.0–7.0)
Eosinophils Absolute: 0.6 10*3/uL — ABNORMAL HIGH (ref 0.0–0.5)
HCT: 48.3 % (ref 38.4–49.9)
HGB: 15.3 g/dL (ref 13.0–17.1)
LYMPH%: 9.6 % — ABNORMAL LOW (ref 14.0–49.0)
MCH: 27 pg — ABNORMAL LOW (ref 27.2–33.4)
MCHC: 31.7 g/dL — ABNORMAL LOW (ref 32.0–36.0)
MCV: 85.1 fL (ref 79.3–98.0)
MONO#: 0.5 10*3/uL (ref 0.1–0.9)
MONO%: 3.7 % (ref 0.0–14.0)
NEUT#: 11.6 10*3/uL — ABNORMAL HIGH (ref 1.5–6.5)
NEUT%: 81.1 % — ABNORMAL HIGH (ref 39.0–75.0)
Platelets: 477 10*3/uL — ABNORMAL HIGH (ref 140–400)
RBC: 5.67 10*6/uL (ref 4.20–5.82)
RDW: 23.9 % — ABNORMAL HIGH (ref 11.0–14.6)
WBC: 14.3 10*3/uL — ABNORMAL HIGH (ref 4.0–10.3)
lymph#: 1.4 10*3/uL (ref 0.9–3.3)

## 2013-11-23 LAB — COMPREHENSIVE METABOLIC PANEL (CC13)
ALT: 16 U/L (ref 0–55)
AST: 17 U/L (ref 5–34)
Albumin: 4.1 g/dL (ref 3.5–5.0)
Alkaline Phosphatase: 104 U/L (ref 40–150)
Anion Gap: 8 mEq/L (ref 3–11)
BUN: 11.6 mg/dL (ref 7.0–26.0)
CO2: 30 mEq/L — ABNORMAL HIGH (ref 22–29)
Calcium: 10.1 mg/dL (ref 8.4–10.4)
Chloride: 104 mEq/L (ref 98–109)
Creatinine: 0.9 mg/dL (ref 0.7–1.3)
Glucose: 123 mg/dl (ref 70–140)
Potassium: 4.5 mEq/L (ref 3.5–5.1)
Sodium: 142 mEq/L (ref 136–145)
Total Bilirubin: 0.67 mg/dL (ref 0.20–1.20)
Total Protein: 6.9 g/dL (ref 6.4–8.3)

## 2013-11-23 LAB — LACTATE DEHYDROGENASE (CC13): LDH: 420 U/L — ABNORMAL HIGH (ref 125–245)

## 2013-11-23 NOTE — Progress Notes (Signed)
[place] Telephone:([phone]) [phone]   Fax:(336) [phone]  NEW PATIENT EVALUATION   Name: Tyler Whitehead Date: 11/23/2013 MRN: [id number] DOB: Jul 08, 1941  PCP: [name], MD   REFERRING PHYSICIAN: Aura Dials, MD  REASON FOR REFERRAL: Primary Myelofibrosis (JAK2-positive) with thrombocytosis.   HISTORY OF PRESENT ILLNESS:Tyler Whitehead is a 73 y.o. male who is being referred for further evaluation by Dr. Sheryn Bison for his primary myelofibrosis.  Today, he reports feeling well.  Of note, he has peripheral vascular disease of his right extremity and he reports recently starting Imdur about 3 weeks ago with notable improvement in his chronic intermittent right lower extremity pain.  He ambulates with a cane as needed.  He follow w Dr. Caralee Ates of [facility]. In fact, he will be seeing him tomorrow. He follows him when he is in the [place] area.  He endorses early satiety and mild fatigue.  He denies any hematochezia or melana. His activities of daily living are performed without difficulty.   His history is as detailed below.    Myelofibrosis   12/26/2012 Bone Marrow Biopsy Showed hypercellularity with reticulum fibrosis, JAK-2 mutation positive, BCR/ABL negative, 21 XY in 20 metaphase, and Intermediate-1 ,DIPSS of 2. (WBC < 25; Hbg > 10;    01/02/2013 Imaging US abdomen complete revealed Mild splenogmealy (13.3 cm) and a 2.2 x 1.7 x 1.8 cm left renal cyst   01/15/2013 Pathology Integrated hematopathology report.  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    03/17/2013 Imaging CT Chest: Crowded peribronchial markings noted, associated with linear densities involving the left base, probably representing areas of platelike atelectasis/scarring.  No mass or infiltrate seen.    03/19/2013 - 07/20/2013 Chemotherapy Started  Jakafi 20 mg bid. Dose reduced due to anemia.  Stopped due to increasing fatigue while on jakafi.    04/28/2013 - 05/01/2013 Hospital Admission Admitted to [facility] by Dr. Francia Greaves, his cardiologist, due to the fact that he had midsternal chest pain.  He had a coronary stent placed this admission.    05/28/2013 - 05/29/2013 Hospital Admission Admitted to [facility] and received 3 units of blood (Dr. Lendon Colonel note).    06/10/2013 Surgery Cololonoscopy due to GIB.  C/w diverticular disease and hemorrhoids but no active bleeding.    10/05/2013 -  Chemotherapy Started Hydrea 500 mg bid.    PAST MEDICAL HISTORY:  has a past medical history of Primary myelofibrosis; Diverticular disease; Hemorrhoid; CAD (coronary artery disease); and Peripheral vascular disease.     PAST SURGICAL HISTORY: Past Surgical History  Procedure Laterality Date  . Coronary artery bypass graft    . Cardiac catheterization  May 2014  . Percutaneous coronary stent intervention (pci-s)  April 27 2013     CURRENT MEDICATIONS: has a current medication list which includes the following prescription(s): aspirin, carvedilol, clopidogrel, hydroxyurea, insulin glargine, isosorbide mononitrate, linagliptin-metformin hcl, lisinopril, nitrostat, pramipexole, and atorvastatin.   ALLERGIES: Review of patient's allergies indicates no known allergies.   SOCIAL HISTORY:  reports that he has never smoked. He has never used smokeless tobacco. He reports that he does not drink alcohol.   FAMILY HISTORY: family history includes Hyperlipidemia in his maternal grandmother.   LABORATORY DATA:  Results for orders placed in visit on 11/23/13 (from the past 48 hour(s))  CBC WITH DIFFERENTIAL     Status: Abnormal   Collection Time  11/23/13 10:29 AM      Result Value Ref Range   WBC 14.3 (*) 4.0 - 10.3 10e3/uL   NEUT# 11.6 (*) 1.5 - 6.5 10e3/uL   HGB 15.3  13.0 - 17.1 g/dL   HCT 48.3  38.4 - 49.9 %    Platelets 477 (*) 140 - 400 10e3/uL   MCV 85.1  79.3 - 98.0 fL   MCH 27.0 (*) 27.2 - 33.4 pg   MCHC 31.7 (*) 32.0 - 36.0 g/dL   RBC 5.67  4.20 - 5.82 10e6/uL   RDW 23.9 (*) 11.0 - 14.6 %   lymph# 1.4  0.9 - 3.3 10e3/uL   MONO# 0.5  0.1 - 0.9 10e3/uL   Eosinophils Absolute 0.6 (*) 0.0 - 0.5 10e3/uL   Basophils Absolute 0.2 (*) 0.0 - 0.1 10e3/uL   NEUT% 81.1 (*) 39.0 - 75.0 %   LYMPH% 9.6 (*) 14.0 - 49.0 %   MONO% 3.7  0.0 - 14.0 %   EOS% 4.4  0.0 - 7.0 %   BASO% 1.2  0.0 - 2.0 %  COMPREHENSIVE METABOLIC PANEL (HD62)     Status: Abnormal   Collection Time    11/23/13 10:30 AM      Result Value Ref Range   Sodium 142  136 - 145 mEq/L   Potassium 4.5  3.5 - 5.1 mEq/L   Chloride 104  98 - 109 mEq/L   CO2 30 (*) 22 - 29 mEq/L   Glucose 123  70 - 140 mg/dl   BUN 11.6  7.0 - 26.0 mg/dL   Creatinine 0.9  0.7 - 1.3 mg/dL   Total Bilirubin 0.67  0.20 - 1.20 mg/dL   Alkaline Phosphatase 104  40 - 150 U/L   AST 17  5 - 34 U/L   ALT 16  0 - 55 U/L   Total Protein 6.9  6.4 - 8.3 g/dL   Albumin 4.1  3.5 - 5.0 g/dL   Calcium 10.1  8.4 - 10.4 mg/dL   Anion Gap 8  3 - 11 mEq/L       RADIOGRAPHY: No results found.     REVIEW OF SYSTEMS:  Constitutional: Denies fevers, chills or abnormal weight loss Eyes: Denies blurriness of vision Ears, nose, mouth, throat, and face: Denies mucositis or sore throat Respiratory: Denies cough, dyspnea or wheezes Cardiovascular: Denies palpitation, chest discomfort or lower extremity swelling Gastrointestinal:  Denies nausea, heartburn or change in bowel habits Skin: Denies abnormal skin rashes Lymphatics: Denies new lymphadenopathy or easy bruising Neurological:Denies numbness, tingling or new weaknesses Behavioral/Psych: Mood is stable, no new changes  All other systems were reviewed with the patient and are negative.  PHYSICAL EXAM:  height is '5\' 7"'  (1.702 m) and weight is 197 lb 4.8 oz (89.495 kg). His oral temperature is 96.7 F (35.9 C). His  blood pressure is 131/58 and his pulse is 65. His respiration is 18.    GENERAL:alert, no distress and comfortable; elderly male who appears his stated age.  SKIN: skin color, texture, turgor are normal, no rashes or significant lesions; Surgical scar.  EYES: normal, Conjunctiva are pink and non-injected, sclera clear OROPHARYNX:no exudate, no erythema and lips, buccal mucosa, and tongue normal  NECK: supple, thyroid normal size, non-tender, without nodularity LYMPH:  no palpable lymphadenopathy in the cervical, axillary or inguinal LUNGS: clear to auscultation and percussion with normal breathing effort HEART: regular rate & rhythm and no murmurs and no lower extremity edema ABDOMEN:abdomen soft, non-tender and normal  bowel sounds; No organomegaly appreciated.  Musculoskeletal:no cyanosis of digits and no clubbing  NEURO: alert & oriented x 3 with fluent speech, no focal motor/sensory deficits   IMPRESSION: Tyler Whitehead is a 73 y.o. male with a history of    PLAN:  1.  Primary Myeloproliferative neoplasm (Myelofibrosis).  --Clinically, he is doing well on hydrea 500 mg bid. He was JAK2 positive but did not tolerate Jakafi due to worsening fatigue and anemia.  He was recently started on hydrea 500 mg bid.  His plts today are 477 down from 576 on 10/27/2013.  His hemoglobin is 15.3.  His  WBCs are 14.3 slightly increased from 10.9 on 10/27/2013.   He reports increased energy.  His creatinine is within normal limits.    --We reviewed his imaging and bone marrow biopsy consistent with primary myelofibrosis intermediate-I, DIPSS of  (Age greater than 80, WBC less than 25, hbg greater than 10). Indications to treat was to lower plts less than 400 to reduce chances of clots including CVA or DVT or MIs.  Hydroxyurea may result in a reduction in spleen size, control of thrombocytosis and leukocytosis, and/or control of constitutional symptoms. People with positive JAK2 respond more favorable to  hydrea. These benefits must be balanced with the risks associated with hydrea including ulcers,myelosuppression, etc.  Understanding these risks, he choose to continue with his hydrea 500 mg bid.  He will have his complete blood count monitored closely while on therapy for titration.   2. CADz s/p CABG x 3, hypertension --Continue lisinopril, coreg, aspirin  3. PvDz.  --Continue Imdur as it appearing to have improved some of his claudication symptoms.   4.  Dyslipidemia. --Continue statin therapy.   5. Follow-up.  --Patient will have labs drawn in 4 weeks and return for symptom follow visit in 2 months with chemistries and cbc.  All questions were answered. The patient knows to call the clinic with any problems, questions or concerns. We can certainly see the patient much sooner if necessary.  I spent 25 minutes counseling the patient face to face. The total time spent in the appointment was 45 minutes. Time was extensive his records provided by [facility].     Codi Folkerts, MD 11/23/2013 1:57 PM

## 2013-11-23 NOTE — Patient Instructions (Signed)
Anemia, Nonspecific Anemia is a condition in which the concentration of red blood cells or hemoglobin in the blood is below normal. Hemoglobin is a substance in red blood cells that carries oxygen to the tissues of the body. Anemia results in not enough oxygen reaching these tissues.  CAUSES  Common causes of anemia include:   Excessive bleeding. Bleeding may be internal or external. This includes excessive bleeding from periods (in women) or from the intestine.   Poor nutrition.   Chronic kidney, thyroid, and liver disease.  Bone marrow disorders that decrease red blood cell production.  Cancer and treatments for cancer.  HIV, AIDS, and their treatments.  Spleen problems that increase red blood cell destruction.  Blood disorders.  Excess destruction of red blood cells due to infection, medicines, and autoimmune disorders. SIGNS AND SYMPTOMS   Minor weakness.   Dizziness.   Headache.  Palpitations.   Shortness of breath, especially with exercise.   Paleness.  Cold sensitivity.  Indigestion.  Nausea.  Difficulty sleeping.  Difficulty concentrating. Symptoms may occur suddenly or they may develop slowly.  DIAGNOSIS  Additional blood tests are often needed. These help your health care provider determine the best treatment. Your health care provider will check your stool for blood and look for other causes of blood loss.  TREATMENT  Treatment varies depending on the cause of the anemia. Treatment can include:   Supplements of iron, vitamin 123456, or folic acid.   Hormone medicines.   A blood transfusion. This may be needed if blood loss is severe.   Hospitalization. This may be needed if there is significant continual blood loss.   Dietary changes.  Spleen removal. HOME CARE INSTRUCTIONS Keep all follow-up appointments. It often takes many weeks to correct anemia, and having your health care provider check on your condition and your response to  treatment is very important. SEEK IMMEDIATE MEDICAL CARE IF:   You develop extreme weakness, shortness of breath, or chest pain.   You become dizzy or have trouble concentrating.  You develop heavy vaginal bleeding.   You develop a rash.   You have bloody or black, tarry stools.   You faint.   You vomit up blood.   You vomit repeatedly.   You have abdominal pain.  You have a fever or persistent symptoms for more than 2 3 days.   You have a fever and your symptoms suddenly get worse.   You are dehydrated.  MAKE SURE YOU:  Understand these instructions.  Will watch your condition.  Will get help right away if you are not doing well or get worse. Document Released: 11/01/2004 Document Revised: 05/27/2013 Document Reviewed: 03/20/2013 [facility] Patient Information 2014 [place]. Hydroxyurea capsules What is this medicine? HYDROXYUREA (hye drox ee yoor EE a) is a chemotherapy drug. It slows the growth of cancer cells. This medicine is used to treat certain leukemias, skin cancer, head and neck cancer, and advanced ovarian cancer. It is also used to control the painful crises of sickle cell anemia. This medicine may be used for other purposes; ask your health care provider or pharmacist if you have questions. COMMON BRAND NAME(S): Droxia, Hydrea What should I tell my health care provider before I take this medicine? They need to know if you have any of these conditions: -immune system problems -infection (especially a virus infection such as chickenpox, cold sores, or herpes) -kidney disease -low blood counts, like low white cell, platelet, or red cell counts -previous or ongoing radiation therapy -  an unusual or allergic reaction to hydroxyurea, other chemotherapy, other medicines, foods, dyes, or preservatives -pregnant or trying to get pregnant -breast-feeding How should I use this medicine? Take this medicine by mouth with a glass of water. Follow the  directions on the prescription label. Take your medicine at regular intervals. Do not take it more often than directed. Do not stop taking except on your doctor's advice. People who are not taking this medicine should not be exposed to it. Wash your hands before and after handling your bottle or medicine. Caregivers should wear disposable gloves if they must touch the bottle or medicine. Clean up any medicine powder that spills with a damp disposable towel and throw the towel away in a closed container, such as a plastic bag. Talk to your pediatrician regarding the use of this medicine in children. Special care may be needed. Patients over 55 years old may have a stronger reaction and need a smaller dose. Overdosage: If you think you have taken too much of this medicine contact a poison control center or emergency room at once. NOTE: This medicine is only for you. Do not share this medicine with others. What if I miss a dose? If you miss a dose, take it as soon as you can. If it is almost time for your next dose, take only that dose. Do not take double or extra doses. What may interact with this medicine? -didanosine -other chemotherapy agents -stavudine -tenofovir -vaccines This list may not describe all possible interactions. Give your health care provider a list of all the medicines, herbs, non-prescription drugs, or dietary supplements you use. Also tell them if you smoke, drink alcohol, or use illegal drugs. Some items may interact with your medicine. What should I watch for while using this medicine? This drug may make you feel generally unwell. This is not uncommon, as chemotherapy can affect healthy cells as well as cancer cells. Report any side effects. Continue your course of treatment even though you feel ill unless your doctor tells you to stop. You will receive regular blood tests during your treatment. Call your doctor or health care professional for advice if you get a fever, chills  or sore throat, or other symptoms of a cold or flu. Do not treat yourself. This drug decreases your body's ability to fight infections. Try to avoid being around people who are sick. This medicine may increase your risk to bruise or bleed. Call your doctor or health care professional if you notice any unusual bleeding. Be careful brushing and flossing your teeth or using a toothpick because you may get an infection or bleed more easily. If you have any dental work done, tell your dentist you are receiving this medicine. Avoid taking products that contain aspirin, acetaminophen, ibuprofen, naproxen, or ketoprofen unless instructed by your doctor. These medicines may hide a fever. Do not become pregnant while taking this medicine. Women should inform their doctor if they wish to become pregnant or think they might be pregnant. There is a potential for serious side effects to an unborn child. Men should inform their doctors if they wish to father a child. This medicine may lower sperm counts. Talk to your health care professional or pharmacist for more information. Do not breast-feed an infant while taking this medicine. What side effects may I notice from receiving this medicine? Side effects that you should report to your doctor or health care professional as soon as possible: -allergic reactions like skin rash, itching or hives, swelling  of the face, lips, or tongue -low blood counts - this medicine may decrease the number of white blood cells, red blood cells and platelets. You may be at increased risk for infections and bleeding. -signs of infection - fever or chills, cough, sore throat, pain or difficulty passing urine -signs of decreased platelets or bleeding - bruising, pinpoint red spots on the skin, black, tarry stools, blood in the urine -signs of decreased red blood cells - unusually weak or tired, fainting spells, lightheadedness -breathing problems -burning, redness or pain at the site of any  radiation therapy -changes in skin color -confusion -mouth sores -pain, tingling, numbness in the hands or feet -seizures -skin ulcers -trouble passing urine or change in the amount of urine -vomiting Side effects that usually do not require medical attention (report to your doctor or health care professional if they continue or are bothersome): -headache -loss of appetite -red color to the face This list may not describe all possible side effects. Call your doctor for medical advice about side effects. You may report side effects to FDA at 1-800-FDA-1088. Where should I keep my medicine? Keep out of the reach of children. Store at room temperature between 15 and 30 degrees C (59 and 86 degrees F). Keep tightly closed. Throw away any unused medicine after the expiration date. NOTE: This sheet is a summary. It may not cover all possible information. If you have questions about this medicine, talk to your doctor, pharmacist, or health care provider.  2014, [employer]vier/Gold Standard. (2008-02-06 15:03:29)

## 2013-11-23 NOTE — Progress Notes (Signed)
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 After his retirement he is moved to [place] for most of the time but continues to go back to the [place] area.  Unfortunately in 2000 he developed chest pain symptoms and ultimately required three-vessel bypass. Although I do not have the operative report, a diagram from his cardiologist's office indicates a 3 vessel CABG with LIMA to LAD, left radial bypass graft to the PDA, and saphenous vein graft to the OM1.  He did well with his bypass for many years up until 2014, when he developed recurrent chest pain. He ultimately had a stent put in the bypass graft to the OM1 branch.  There was return of pain several weeks after this and was found to have obstruction distal to the LAD anastomosis of the LIMA and received a resolute drug-eluting stent to that area.  He has been maintained on Plavix and aspirin since that time. His ejection fraction is apparently normal. He was followed by Dr. Dalbert Batman, his cardiologist in [place]. His CABG was at [facility] in [facility], [place]. he is also noted to have PAD, reportedly with a blockage in his right thigh that cannot be bypassed.  He is being followed by vascular surgeon in [place] for this.  Currently is asymptomatic, denying any chest pain or worsening shortness of breath.  He does report a history of diverticular bleeding in the past but has not had any recently and has done well on aspirin and Plavix for DAPT.  PMHx:  Past Medical History  Diagnosis Date  . Primary myelofibrosis   . Diverticular disease   . Hemorrhoid   . CAD (coronary artery disease)   . Peripheral vascular disease     Past Surgical History  Procedure Laterality Date  . Coronary artery bypass graft    . Cardiac catheterization  May 2014  . Percutaneous coronary  stent intervention (pci-s)  April 27 2013    FAMHx:  Family History  Problem Relation Age of Onset  . Hyperlipidemia Maternal Grandmother     SOCHx:   reports that he has never smoked. He has never used smokeless tobacco. He reports that he does not drink alcohol. His drug history is not on file.  ALLERGIES:  No Known Allergies  ROS: A comprehensive review of systems was negative.  HOME MEDS: Current Outpatient Prescriptions  Medication Sig Dispense Refill  . aspirin 81 MG tablet Take 81 mg by mouth daily.      Marland Kitchen atorvastatin (LIPITOR) 40 MG tablet Take 40 mg by mouth daily.      . carvedilol (COREG) 3.125 MG tablet Take 3.125 mg by mouth 2 (two) times daily with a meal.      . clopidogrel (PLAVIX) 75 MG tablet Take 75 mg by mouth daily with breakfast.      . hydroxyurea (HYDREA) 500 MG capsule Take 500 mg by mouth 2 (two) times daily. 2 capsules      . Insulin Glargine (LANTUS SOLOSTAR) 100 UNIT/ML Solostar Pen Inject 10 Units into the skin 2 (two) times daily.      . isosorbide mononitrate (IMDUR) 30 MG 24 hr tablet Take 30 mg by mouth daily.      . Linagliptin-Metformin HCl 2.5-500 MG TABS Take 1 tablet by mouth 2 (two) times daily.      Marland Kitchen  lisinopril (PRINIVIL,ZESTRIL) 20 MG tablet Take 20 mg by mouth daily.      Marland Kitchen NITROSTAT 0.4 MG SL tablet       . pramipexole (MIRAPEX) 0.125 MG tablet Take 0.125 mg by mouth at bedtime.       No current facility-administered medications for this visit.    LABS/IMAGING: No results found for this or any previous visit (from the past 48 hour(s)). No results found.  VITALS: BP 120/76  Ht 5\' 7"  (1.702 m)  Wt 197 lb 6.4 oz (89.54 kg)  BMI 30.91 kg/m2  EXAM: General appearance: alert and no distress Neck: no carotid bruit and no JVD Lungs: clear to auscultation bilaterally Heart: regular rate and rhythm, S1, S2 normal, no murmur, click, rub or gallop Abdomen: soft, non-tender; bowel sounds normal; no masses,  no  organomegaly Extremities: extremities normal, atraumatic, no cyanosis or edema Pulses: 2+ and symmetric Skin: Skin color, texture, turgor normal. No rashes or lesions Neurologic: Grossly normal PSych: Mood, affect normal  EKG: I reviewed an EKG recently from his cardiologist in [place], which shows normal sinus rhythm and right bundle branch block at 68  ASSESSMENT: 1. CAD status post three-vessel CABG in 2000 (LIMA to LAD, left radial to PDA, SVG to OM1) 2. PCI to the SVG to [place] in 2014, additional PCI with a drug-eluting stent to the distal native LAD after the LIMA anastomosis in 2014 3. Dyslipidemia 4. Hypertension 5. PAD 6. Insulin dependent diabetes  PLAN: 1.   Tyler Whitehead has an extensive heart history with good records. I will go ahead and obtain additional records about his bypass surgery and recent office notes. He is on appropriate medications although for some reason is taking both Lipitor 20 and 40 mg tablets. It is unclear whether he is supposed to be on 60 mg or whether his prescription was changed and he continues to take an old prescription. Hopefully can clarify that from the office notes of his prior cardiologist. Otherwise I would continue his current medications. With respect to his PAD, he should continue to follow with a vascular surgeon. I would recommend staying on his aspirin and Plavix long term, perhaps lifetime if he did not have recurrent bleeding problems. Technically, B1 year. After drug-eluting stent placement would and in May of 2015, but I would be hesitant to discontinue dual antiplatelet therapy given his peripheral arterial disease and prior bypass.  Thank you for allowing me to participate in his care.  Pixie Casino, MD, [facility] Attending Cardiologist [facility]  HILTY,Kenneth C 11/23/2013, 1:43 PM

## 2013-11-23 NOTE — Progress Notes (Signed)
Checked in new patient with no financial issues. He has appt card. °

## 2013-11-23 NOTE — Telephone Encounter (Signed)
gv and printed appt sched and avs for pt for March and April °

## 2013-11-23 NOTE — Patient Instructions (Signed)
Your physician wants you to follow-up in: 6 months with Dr. Hilty. You will receive a reminder letter in the mail two months in advance. If you don't receive a letter, please call our office to schedule the follow-up appointment.    

## 2013-12-02 ENCOUNTER — Encounter: Payer: Self-pay | Admitting: Internal Medicine

## 2013-12-03 DIAGNOSIS — J019 Acute sinusitis, unspecified: Secondary | ICD-10-CM | POA: Diagnosis not present

## 2013-12-03 DIAGNOSIS — J209 Acute bronchitis, unspecified: Secondary | ICD-10-CM | POA: Diagnosis not present

## 2013-12-21 ENCOUNTER — Other Ambulatory Visit (HOSPITAL_BASED_OUTPATIENT_CLINIC_OR_DEPARTMENT_OTHER): Payer: Medicare Other

## 2013-12-21 DIAGNOSIS — D7581 Myelofibrosis: Secondary | ICD-10-CM

## 2013-12-21 LAB — CBC WITH DIFFERENTIAL/PLATELET
BASO%: 1 % (ref 0.0–2.0)
Basophils Absolute: 0.2 10*3/uL — ABNORMAL HIGH (ref 0.0–0.1)
EOS%: 3.2 % (ref 0.0–7.0)
Eosinophils Absolute: 0.6 10*3/uL — ABNORMAL HIGH (ref 0.0–0.5)
HCT: 47.1 % (ref 38.4–49.9)
HGB: 14.9 g/dL (ref 13.0–17.1)
LYMPH%: 9.1 % — ABNORMAL LOW (ref 14.0–49.0)
MCH: 27.2 pg (ref 27.2–33.4)
MCHC: 31.6 g/dL — ABNORMAL LOW (ref 32.0–36.0)
MCV: 86 fL (ref 79.3–98.0)
MONO#: 1.1 10*3/uL — ABNORMAL HIGH (ref 0.1–0.9)
MONO%: 6.4 % (ref 0.0–14.0)
NEUT#: 14.3 10*3/uL — ABNORMAL HIGH (ref 1.5–6.5)
NEUT%: 80.3 % — ABNORMAL HIGH (ref 39.0–75.0)
Platelets: 451 10*3/uL — ABNORMAL HIGH (ref 140–400)
RBC: 5.47 10*6/uL (ref 4.20–5.82)
RDW: 24.3 % — ABNORMAL HIGH (ref 11.0–14.6)
WBC: 17.8 10*3/uL — ABNORMAL HIGH (ref 4.0–10.3)
lymph#: 1.6 10*3/uL (ref 0.9–3.3)

## 2013-12-30 ENCOUNTER — Encounter: Payer: Self-pay | Admitting: *Deleted

## 2014-01-18 ENCOUNTER — Telehealth: Payer: Self-pay | Admitting: Internal Medicine

## 2014-01-18 ENCOUNTER — Ambulatory Visit (HOSPITAL_BASED_OUTPATIENT_CLINIC_OR_DEPARTMENT_OTHER): Payer: Medicare Other | Admitting: Internal Medicine

## 2014-01-18 ENCOUNTER — Other Ambulatory Visit (HOSPITAL_BASED_OUTPATIENT_CLINIC_OR_DEPARTMENT_OTHER): Payer: Medicare Other

## 2014-01-18 VITALS — BP 117/61 | HR 96 | Temp 97.9°F | Resp 18 | Ht 67.0 in | Wt 191.2 lb

## 2014-01-18 DIAGNOSIS — Z951 Presence of aortocoronary bypass graft: Secondary | ICD-10-CM

## 2014-01-18 DIAGNOSIS — R161 Splenomegaly, not elsewhere classified: Secondary | ICD-10-CM

## 2014-01-18 DIAGNOSIS — D7581 Myelofibrosis: Secondary | ICD-10-CM

## 2014-01-18 LAB — CBC WITH DIFFERENTIAL/PLATELET
BASO%: 0.7 % (ref 0.0–2.0)
Basophils Absolute: 0.2 10*3/uL — ABNORMAL HIGH (ref 0.0–0.1)
EOS%: 2.9 % (ref 0.0–7.0)
Eosinophils Absolute: 0.6 10*3/uL — ABNORMAL HIGH (ref 0.0–0.5)
HCT: 51.1 % — ABNORMAL HIGH (ref 38.4–49.9)
HGB: 16.1 g/dL (ref 13.0–17.1)
LYMPH%: 7 % — ABNORMAL LOW (ref 14.0–49.0)
MCH: 27.3 pg (ref 27.2–33.4)
MCHC: 31.5 g/dL — ABNORMAL LOW (ref 32.0–36.0)
MCV: 86.7 fL (ref 79.3–98.0)
MONO#: 1 10*3/uL — ABNORMAL HIGH (ref 0.1–0.9)
MONO%: 4.6 % (ref 0.0–14.0)
NEUT#: 19 10*3/uL — ABNORMAL HIGH (ref 1.5–6.5)
NEUT%: 84.8 % — ABNORMAL HIGH (ref 39.0–75.0)
Platelets: 553 10*3/uL — ABNORMAL HIGH (ref 140–400)
RBC: 5.9 10*6/uL — ABNORMAL HIGH (ref 4.20–5.82)
RDW: 22.4 % — ABNORMAL HIGH (ref 11.0–14.6)
WBC: 22.4 10*3/uL — ABNORMAL HIGH (ref 4.0–10.3)
lymph#: 1.6 10*3/uL (ref 0.9–3.3)

## 2014-01-18 LAB — BASIC METABOLIC PANEL (CC13)
Anion Gap: 9 mEq/L (ref 3–11)
BUN: 16.4 mg/dL (ref 7.0–26.0)
CO2: 27 mEq/L (ref 22–29)
Calcium: 9.6 mg/dL (ref 8.4–10.4)
Chloride: 105 mEq/L (ref 98–109)
Creatinine: 1 mg/dL (ref 0.7–1.3)
Glucose: 108 mg/dl (ref 70–140)
Potassium: 4.3 mEq/L (ref 3.5–5.1)
Sodium: 142 mEq/L (ref 136–145)

## 2014-01-18 NOTE — Progress Notes (Signed)
[facility] OFFICE PROGRESS NOTE  [name], MD [phone] [address] [place] [address]  DIAGNOSIS: Myelofibrosis  S/P CABG x 3  Splenomegaly  Chief Complaint  Patient presents with  . Myelofibrosis    CURRENT TREATMENT:  Hydrea 500 mg once daily.     Myelofibrosis   12/26/2012 Bone Marrow Biopsy Showed hypercellularity with reticulum fibrosis, JAK-2 mutation positive, BCR/ABL negative, 4 XY in 20 metaphase, and Intermediate-1 ,DIPSS of 2. (WBC < 25; Hbg > 10;    01/02/2013 Imaging US abdomen complete revealed Mild splenogmealy (13.3 cm) and a 2.2 x 1.7 x 1.8 cm left renal cyst   01/15/2013 Pathology Integrated hematopathology report.  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    03/17/2013 Imaging CT Chest: Crowded peribronchial markings noted, associated with linear densities involving the left base, probably representing areas of platelike atelectasis/scarring.  No mass or infiltrate seen.    03/19/2013 - 07/20/2013 Chemotherapy Started Jakafi 20 mg bid. Dose reduced due to anemia.  Stopped due to increasing fatigue while on jakafi.    04/28/2013 - 05/01/2013 Hospital Admission Admitted to [facility] by Dr. Francia Greaves, his cardiologist, due to the fact that he had midsternal chest pain.  He had a coronary stent placed this admission.    05/28/2013 - 05/29/2013 Hospital Admission Admitted to [facility] and received 3 units of blood (Dr. Lendon Colonel note).    06/10/2013 Surgery Cololonoscopy due to GIB.  C/w diverticular disease and hemorrhoids but no active bleeding.    10/05/2013 -  Chemotherapy Started Hydrea 500 mg daily.     INTERVAL HISTORY: Tyler Whitehead 73 y.o. male who was referred for further evaluation by Dr. Sheryn Bison for his primary myelofibrosis and was seen on 11/24/2011. He saw Dr.  Debara Pickett without changes in his medications.  Today, he reports feeling increased fatigue.  He ambulates with a cane as needed.He endorses early satiety and mild fatigue. He denies any hematochezia or melana. His activities of daily living are performed without difficulty. He denies any kidney problems or chest pain or discomfort.   MEDICAL HISTORY: Past Medical History  Diagnosis Date  . Primary myelofibrosis   . Diverticular disease   . Hemorrhoid   . CAD (coronary artery disease)   . Peripheral vascular disease     INTERIM HISTORY: has Myelofibrosis; Diverticular disease; Hemorrhoids; S/P CABG x 3; CAD (coronary artery disease); Peripheral vascular disease; DM2 (diabetes mellitus, type 2); Renal cyst, left; Splenomegaly; HTN (hypertension); and Dyslipidemia on his problem list.    ALLERGIES:  has No Known Allergies.  MEDICATIONS: has a current medication list which includes the following prescription(s): aspirin, atorvastatin, carvedilol, clopidogrel, hydroxyurea, insulin glargine, isosorbide mononitrate, linagliptin-metformin hcl, lisinopril, nitrostat, and pramipexole.  SURGICAL HISTORY:  Past Surgical History  Procedure Laterality Date  . Coronary artery bypass graft    . Cardiac catheterization  May 2014  . Percutaneous coronary stent intervention (pci-s)  April 27 2013    REVIEW OF SYSTEMS:   Constitutional: Denies fevers, chills or abnormal weight loss Eyes: Denies blurriness of vision Ears, nose, mouth, throat, and face: Denies mucositis or sore throat Respiratory: Denies cough, dyspnea or wheezes Cardiovascular: Denies palpitation, chest discomfort or lower extremity swelling Gastrointestinal:  Denies nausea, heartburn or change in bowel habits Skin: Denies abnormal skin rashes Lymphatics: Denies new lymphadenopathy or easy bruising Neurological:Denies numbness, tingling or new weaknesses Behavioral/Psych: Mood is stable,  no new changes  All other systems were reviewed  with the patient and are negative.  PHYSICAL EXAMINATION: ECOG PERFORMANCE STATUS: 1 - Symptomatic but completely ambulatory  Blood pressure 117/61, pulse 96, temperature 97.9 F (36.6 C), temperature source Oral, resp. rate 18, height _0  (1.702 m), weight 191 lb 3.2 oz (86.728 kg), SpO2 100.00%.  GENERAL:alert, no distress and comfortable; elderly male who appears his stated age.  SKIN: skin color, texture, turgor are normal, no rashes or significant lesions EYES: normal, Conjunctiva are pink and non-injected, sclera clear OROPHARYNX:no exudate, no erythema and lips, buccal mucosa, and tongue normal  NECK: supple, thyroid normal size, non-tender, without nodularity LYMPH:  no palpable lymphadenopathy in the cervical, axillary or supraclavicular LUNGS: clear to auscultation with normal breathing effort, no wheezes or rhonchi HEART: regular rate & rhythm and no murmurs and no lower extremity edema ABDOMEN:abdomen soft, non-tender and normal bowel sounds Musculoskeletal:no cyanosis of digits and no clubbing  NEURO: alert & oriented x 3 with fluent speech, no focal motor/sensory deficits  Labs:  Lab Results  Component Value Date   WBC 22.4* 01/18/2014   HGB 16.1 01/18/2014   HCT 51.1* 01/18/2014   MCV 86.7 01/18/2014   PLT 553* 01/18/2014   NEUTROABS 19.0* 01/18/2014      Chemistry      Component Value Date/Time   NA 142 01/18/2014 1447   K 4.3 01/18/2014 1447   CO2 27 01/18/2014 1447   BUN 16.4 01/18/2014 1447   CREATININE 1.0 01/18/2014 1447      Component Value Date/Time   CALCIUM 9.6 01/18/2014 1447   ALKPHOS 104 11/23/2013 1030   AST 17 11/23/2013 1030   ALT 16 11/23/2013 1030   BILITOT 0.67 11/23/2013 1030       Basic Metabolic Panel:  Recent Labs Lab 01/18/14 1447  NA 142  K 4.3  CO2 27  GLUCOSE 108  BUN 16.4  CREATININE 1.0  CALCIUM 9.6   CBC:  Recent Labs Lab 01/18/14 1447  WBC 22.4*  NEUTROABS 19.0*  HGB 16.1  HCT 51.1*  MCV 86.7  PLT 553*     Studies:  No results found.   RADIOGRAPHIC STUDIES: No results found.  ASSESSMENT: Tyler Whitehead 74 y.o. male with a history of Myelofibrosis  S/P CABG x 3  Splenomegaly   PLAN:   1. Primary Myeloproliferative neoplasm (Myelofibrosis).  --Clinically, he is doing ok but he reports taking hydrea once daily.  We have instructed him to increase this to  hydrea 500 mg bid. He was JAK2 positive but did not tolerate Jakafi due to worsening fatigue and anemia.  His plts today are 553 up from 477 down from 576 on 10/27/2013. His hemoglobin is 16.1 up from 15.3. His WBCs are 22.4 up from 14.3 slightly increased from 10.9 on 10/27/2013. He reports increased energy. His creatinine is within normal limits.  --We reviewed his imaging and bone marrow biopsy consistent with primary myelofibrosis intermediate-I, DIPSS of (Age greater than 67, WBC less than 25, hbg greater than 10). Indications to treat was to lower plts less than 400 to reduce chances of clots including CVA or DVT or MIs. Hydroxyurea may result in a reduction in spleen size, control of thrombocytosis and leukocytosis, and/or control of constitutional symptoms. People with positive JAK2 respond more favorable to hydrea. These benefits must be balanced with the risks associated with hydrea including ulcers,myelosuppression, etc. Understanding these risks, he choose to continue with his hydrea 500 mg bid. He will  have his complete blood count monitored closely while on therapy for titration.   2. CADz s/p CABG x 3, hypertension  --Continue lisinopril, coreg, aspirin   3. PvDz.  --Continue Imdur as it appearing to have improved some of his claudication symptoms.   4. Dyslipidemia.  --Continue statin therapy.   5. Follow-up.  --Patient will have labs drawn in 4 weeks and return for symptom follow visit in 2 months with chemistries and cbc.   All questions were answered. The patient knows to call the clinic with any problems,  questions or concerns. We can certainly see the patient much sooner if necessary.  I spent 15 minutes counseling the patient face to face. The total time spent in the appointment was 25 minutes.    Concha Norway, MD 01/18/2014 3:36 PM

## 2014-01-18 NOTE — Telephone Encounter (Signed)
gv pt appt schedule for may/june °

## 2014-01-19 ENCOUNTER — Ambulatory Visit (INDEPENDENT_AMBULATORY_CARE_PROVIDER_SITE_OTHER): Payer: Medicare Other | Admitting: Cardiology

## 2014-01-19 ENCOUNTER — Encounter: Payer: Self-pay | Admitting: Cardiology

## 2014-01-19 VITALS — BP 122/72 | HR 100 | Ht 67.0 in | Wt 190.0 lb

## 2014-01-19 DIAGNOSIS — I739 Peripheral vascular disease, unspecified: Secondary | ICD-10-CM

## 2014-01-19 DIAGNOSIS — I2510 Atherosclerotic heart disease of native coronary artery without angina pectoris: Secondary | ICD-10-CM

## 2014-01-19 NOTE — Patient Instructions (Signed)
The current medical regimen is effective;  continue present plan and medications.  Follow up with Dr Percival Spanish in 1 month.

## 2014-01-19 NOTE — Progress Notes (Signed)
HPI The patient presents as a new patient for evaluation of PVD.  He has a complicated past history of CAD with treatment in [place].  He has CABG and PVD.  He reports coronary stenting last year.  The patient denies any new symptoms such as chest discomfort, neck or arm discomfort. There has been no new shortness of breath, PND or orthopnea. There have been no reported palpitations, presyncope or syncope.  However, his biggest limitation is right calf pain with walking.  This happens after walking a few yards on level ground.  He does not report resting leg pain.    No Known Allergies  Current Outpatient Prescriptions  Medication Sig Dispense Refill  . aspirin 81 MG tablet Take 81 mg by mouth daily.      Marland Kitchen atorvastatin (LIPITOR) 40 MG tablet Take 40 mg by mouth daily.      . carvedilol (COREG) 3.125 MG tablet Take 3.125 mg by mouth 2 (two) times daily with a meal.      . clopidogrel (PLAVIX) 75 MG tablet Take 75 mg by mouth daily with breakfast.      . hydroxyurea (HYDREA) 500 MG capsule Take 500 mg by mouth 2 (two) times daily. 2 capsules      . Insulin Glargine (LANTUS SOLOSTAR) 100 UNIT/ML Solostar Pen Inject 10 Units into the skin 2 (two) times daily.      . isosorbide mononitrate (IMDUR) 30 MG 24 hr tablet Take 30 mg by mouth daily.      . Linagliptin-Metformin HCl 2.5-500 MG TABS Take 1 tablet by mouth 2 (two) times daily.      Marland Kitchen lisinopril (PRINIVIL,ZESTRIL) 20 MG tablet Take 20 mg by mouth daily.      Marland Kitchen NITROSTAT 0.4 MG SL tablet       . pramipexole (MIRAPEX) 0.125 MG tablet Take 0.125 mg by mouth at bedtime.      Marland Kitchen PROAIR HFA 108 (90 BASE) MCG/ACT inhaler        No current facility-administered medications for this visit.    Past Medical History  Diagnosis Date  . Primary myelofibrosis   . Diverticular disease   . Hemorrhoid   . CAD (coronary artery disease)   . Peripheral vascular disease     Past Surgical History  Procedure Laterality Date  . Coronary artery bypass graft     . Cardiac catheterization  May 2014  . Percutaneous coronary stent intervention (pci-s)  April 27 2013    Family History  Problem Relation Age of Onset  . Hyperlipidemia Maternal Grandmother     History   Social History  . Marital Status: Married    Spouse Name: N/A    Number of Children: N/A  . Years of Education: N/A   Occupational History  . Limo driver    Social History Main Topics  . Smoking status: Never Smoker   . Smokeless tobacco: Never Used  . Alcohol Use: No  . Drug Use: Not on file  . Sexual Activity: Not on file   Other Topics Concern  . Not on file   Social History Narrative  . No narrative on file    ROS:  As stated in the HPI and negative for all other systems.  PHYSICAL EXAM BP 122/72  Pulse 100  Ht 5\' 7"  (1.702 m)  Wt 190 lb (86.183 kg)  BMI 29.75 kg/m2 GENERAL:  Well appearing HEENT:  Pupils equal round and reactive, fundi not visualized, oral mucosa unremarkable NECK:  No  jugular venous distention, waveform within normal limits, carotid upstroke brisk and symmetric, no bruits, no thyromegaly LYMPHATICS:  No cervical, inguinal adenopathy LUNGS:  Clear to auscultation bilaterally BACK:  No CVA tenderness CHEST:  Well healed sternotomy scar. HEART:  PMI not displaced or sustained,S1 and S2 within normal limits, no S3, no S4, no clicks, no rubs, no murmurs ABD:  Flat, positive bowel sounds normal in frequency in pitch, no bruits, no rebound, no guarding, no midline pulsatile mass, no hepatomegaly, no splenomegaly EXT:  2 plus pulses upper, absent left radial, absent femoral, DP/PT, no edema, no cyanosis no clubbing SKIN:  No rashes no nodules NEURO:  Cranial nerves II through XII grossly intact, motor grossly intact throughout PSYCH:  Cognitively intact, oriented to person place and time  EKG:  Normal sinus rhythm, rate 90, premature atrial contractions, right bundle branch block, no acute ST-T wave changes. 01/19/2014   ASSESSMENT AND  PLAN  CAD:  He has no active chest pain.  I need to have him bring his previous records.  For now he will need aggressive risk reduction.  HTN:  The blood pressure is at target. No change in medications is indicated. We will continue with therapeutic lifestyle changes (TLC).  DM:   Per [name], MD  PVD:  This is the primary complaint.  He reports an angiogram in [place] last year. I will try to get report of this prior to considering any further imaging.

## 2014-01-27 ENCOUNTER — Encounter: Payer: Self-pay | Admitting: Cardiology

## 2014-01-29 ENCOUNTER — Telehealth: Payer: Self-pay | Admitting: Internal Medicine

## 2014-01-29 NOTE — Telephone Encounter (Signed)
returned pt call and lvm for pt to call back to rs °

## 2014-01-29 NOTE — Telephone Encounter (Signed)
no answer no vm..returned pt call

## 2014-02-17 ENCOUNTER — Other Ambulatory Visit (HOSPITAL_BASED_OUTPATIENT_CLINIC_OR_DEPARTMENT_OTHER): Payer: Medicare Other

## 2014-02-17 DIAGNOSIS — D7581 Myelofibrosis: Secondary | ICD-10-CM

## 2014-02-17 DIAGNOSIS — R161 Splenomegaly, not elsewhere classified: Secondary | ICD-10-CM

## 2014-02-17 LAB — CBC WITH DIFFERENTIAL/PLATELET
BASO%: 0.8 % (ref 0.0–2.0)
Basophils Absolute: 0.1 10*3/uL (ref 0.0–0.1)
EOS%: 3.3 % (ref 0.0–7.0)
Eosinophils Absolute: 0.5 10*3/uL (ref 0.0–0.5)
HCT: 49.1 % (ref 38.4–49.9)
HGB: 15.6 g/dL (ref 13.0–17.1)
LYMPH%: 8.9 % — ABNORMAL LOW (ref 14.0–49.0)
MCH: 27.6 pg (ref 27.2–33.4)
MCHC: 31.7 g/dL — ABNORMAL LOW (ref 32.0–36.0)
MCV: 86.9 fL (ref 79.3–98.0)
MONO#: 0.6 10*3/uL (ref 0.1–0.9)
MONO%: 4.4 % (ref 0.0–14.0)
NEUT#: 11.7 10*3/uL — ABNORMAL HIGH (ref 1.5–6.5)
NEUT%: 82.6 % — ABNORMAL HIGH (ref 39.0–75.0)
Platelets: 473 10*3/uL — ABNORMAL HIGH (ref 140–400)
RBC: 5.65 10*6/uL (ref 4.20–5.82)
RDW: 22 % — ABNORMAL HIGH (ref 11.0–14.6)
WBC: 14.2 10*3/uL — ABNORMAL HIGH (ref 4.0–10.3)
lymph#: 1.3 10*3/uL (ref 0.9–3.3)

## 2014-02-24 ENCOUNTER — Ambulatory Visit: Payer: Medicare Other | Admitting: Cardiology

## 2014-03-18 ENCOUNTER — Encounter: Payer: Self-pay | Admitting: Podiatry

## 2014-03-18 ENCOUNTER — Encounter: Payer: Self-pay | Admitting: Cardiology

## 2014-03-18 ENCOUNTER — Ambulatory Visit (INDEPENDENT_AMBULATORY_CARE_PROVIDER_SITE_OTHER): Payer: Medicare Other | Admitting: Podiatry

## 2014-03-18 ENCOUNTER — Ambulatory Visit (INDEPENDENT_AMBULATORY_CARE_PROVIDER_SITE_OTHER): Payer: Medicare Other | Admitting: Cardiology

## 2014-03-18 VITALS — BP 140/79 | HR 73 | Resp 16 | Ht 67.0 in | Wt 196.0 lb

## 2014-03-18 VITALS — BP 124/80 | HR 75 | Ht 67.0 in | Wt 196.3 lb

## 2014-03-18 DIAGNOSIS — I739 Peripheral vascular disease, unspecified: Secondary | ICD-10-CM

## 2014-03-18 DIAGNOSIS — E1149 Type 2 diabetes mellitus with other diabetic neurological complication: Secondary | ICD-10-CM

## 2014-03-18 DIAGNOSIS — I2510 Atherosclerotic heart disease of native coronary artery without angina pectoris: Secondary | ICD-10-CM

## 2014-03-18 DIAGNOSIS — B351 Tinea unguium: Secondary | ICD-10-CM

## 2014-03-18 DIAGNOSIS — M79609 Pain in unspecified limb: Secondary | ICD-10-CM

## 2014-03-18 MED ORDER — ATORVASTATIN CALCIUM 40 MG PO TABS: 60.0000 mg | ORAL_TABLET | Freq: Every day | ORAL | Status: DC

## 2014-03-18 NOTE — Patient Instructions (Signed)
Please increase your Atorvastatin to 60 mg a day (1 and 1/2 tablets). Continue all other medications as listed.  You have been referred to Dr Quay Burow for leg pain.

## 2014-03-18 NOTE — Patient Instructions (Signed)
Diabetes and Foot Care Diabetes may cause you to have problems because of poor blood supply (circulation) to your feet and legs. This may cause the skin on your feet to become thinner, break easier, and heal more slowly. Your skin may become dry, and the skin may peel and crack. You may also have nerve damage in your legs and feet causing decreased feeling in them. You may not notice minor injuries to your feet that could lead to infections or more serious problems. Taking care of your feet is one of the most important things you can do for yourself.  HOME CARE INSTRUCTIONS  Wear shoes at all times, even in the house. Do not go barefoot. Bare feet are easily injured.  Check your feet daily for blisters, cuts, and redness. If you cannot see the bottom of your feet, use a mirror or ask someone for help.  Wash your feet with warm water (do not use hot water) and mild soap. Then pat your feet and the areas between your toes until they are completely dry. Do not soak your feet as this can dry your skin.  Apply a moisturizing lotion or petroleum jelly (that does not contain alcohol and is unscented) to the skin on your feet and to dry, brittle toenails. Do not apply lotion between your toes.  Trim your toenails straight across. Do not dig under them or around the cuticle. File the edges of your nails with an emery board or nail file.  Do not cut corns or calluses or try to remove them with medicine.  Wear clean socks or stockings every day. Make sure they are not too tight. Do not wear knee-high stockings since they may decrease blood flow to your legs.  Wear shoes that fit properly and have enough cushioning. To break in new shoes, wear them for just a few hours a day. This prevents you from injuring your feet. Always look in your shoes before you put them on to be sure there are no objects inside.  Do not cross your legs. This may decrease the blood flow to your feet.  If you find a minor scrape,  cut, or break in the skin on your feet, keep it and the skin around it clean and dry. These areas may be cleansed with mild soap and water. Do not cleanse the area with peroxide, alcohol, or iodine.  When you remove an adhesive bandage, be sure not to damage the skin around it.  If you have a wound, look at it several times a day to make sure it is healing.  Do not use heating pads or hot water bottles. They may burn your skin. If you have lost feeling in your feet or legs, you may not know it is happening until it is too late.  Make sure your health care provider performs a complete foot exam at least annually or more often if you have foot problems. Report any cuts, sores, or bruises to your health care provider immediately. SEEK MEDICAL CARE IF:   You have an injury that is not healing.  You have cuts or breaks in the skin.  You have an ingrown nail.  You notice redness on your legs or feet.  You feel burning or tingling in your legs or feet.  You have pain or cramps in your legs and feet.  Your legs or feet are numb.  Your feet always feel cold. SEEK IMMEDIATE MEDICAL CARE IF:   There is increasing redness,   swelling, or pain in or around a wound.  There is a red line that goes up your leg.  Pus is coming from a wound.  You develop a fever or as directed by your health care provider.  You notice a bad smell coming from an ulcer or wound. Document Released: 09/21/2000 Document Revised: 05/27/2013 Document Reviewed: 03/03/2013 [employer] Patient Information 2014 [employer].  

## 2014-03-18 NOTE — Addendum Note (Signed)
Addended byLauralee Evener. on: 03/18/2014 10:12 AM   Modules accepted: Orders

## 2014-03-18 NOTE — Progress Notes (Signed)
   Subjective:    Patient ID: Tyler Whitehead, male    DOB: 11/24/1940, 73 y.o.   MRN: [id number]  HPI Comments: "This toe is feeling funny and hurts"  Patient c/o tingling and discomfort in the 1st toe right for several months. The toenail has a dark line through the nail. He says the skin looks real dark sometimes. He also needs the toenails trimmed.  Toe Pain       Review of Systems  Constitutional: Positive for appetite change, fatigue and unexpected weight change.  HENT: Positive for ear pain, sinus pressure, sneezing, sore throat and tinnitus.   Eyes: Positive for pain, redness and itching.  Respiratory: Positive for apnea, chest tightness, shortness of breath and wheezing.   Cardiovascular: Positive for leg swelling.       Calf pain with walking  Gastrointestinal: Positive for vomiting, constipation and abdominal distention.  Genitourinary: Positive for frequency and decreased urine volume.  Musculoskeletal: Positive for arthralgias, back pain, gait problem and myalgias.  Skin:       change in nail  Allergic/Immunologic: Positive for environmental allergies.  Neurological: Positive for weakness and light-headedness.  Hematological: Bruises/bleeds easily.  Psychiatric/Behavioral: The patient is nervous/anxious.   All other systems reviewed and are negative.      Objective:   Physical Exam        Assessment & Plan:

## 2014-03-18 NOTE — Progress Notes (Signed)
HPI The patient presents as a new patient for evaluation of PVD.  He has a complicated past history of CAD with treatment in [place].  He has CABG and PVD.   At the first visit I did not have his records.  I have received and reviewed these.  These are updated below.  He did have coronary stenting of the vein graft and also PCI of his native LAD most recently last year. The "in the catheterization note from the cardiologist was "medical therapy should be enhanced. If the patient has ongoing exertional chest discomfort despite mass normal medical management and if he has evidence of inferior ischemia then repeat PCI of the native right coronary artery could be considered. However, given the patient's track record the right coronary artery is not likely to remain open even at PCI could be accomplished."  His biggest complaint continues to be right lower extremity pain. He brings records today on his peripheral vascular disease. I did review these. The right tibial peroneal trunk has severe diffuse disease. Right anterior tibial is totally occluded. There is an 80% posterior tibial lesion. There was more distal disease in the foot. There is one-vessel runoff of the right foot in the infrapopliteal region. This was thought to be difficult to approach percutaneously. The patient was sent to a surgeon. This was all in October of last year. He thought that bypass grafting would be increased risk for failure. He recommended observation. Unfortunately the patient continues to have leg pain with minimal ambulation from his hip down to his knee. He also has some resting pain. There are no nonhealing wounds. He is not having any chest discomfort though he does have some dyspnea with exertion.   No Known Allergies  Current Outpatient Prescriptions  Medication Sig Dispense Refill  . aspirin 81 MG tablet Take 81 mg by mouth daily.      Marland Kitchen atorvastatin (LIPITOR) 40 MG tablet Take 40 mg by mouth daily.      . carvedilol  (COREG) 3.125 MG tablet Take 3.125 mg by mouth 2 (two) times daily with a meal.      . clopidogrel (PLAVIX) 75 MG tablet Take 75 mg by mouth daily with breakfast.      . hydroxyurea (HYDREA) 500 MG capsule Take 500 mg by mouth 2 (two) times daily.       . Insulin Glargine (LANTUS SOLOSTAR) 100 UNIT/ML Solostar Pen Inject 10 Units into the skin 2 (two) times daily.      . isosorbide mononitrate (IMDUR) 30 MG 24 hr tablet Take 30 mg by mouth daily.      . Linagliptin-Metformin HCl 2.5-500 MG TABS Take 1 tablet by mouth 2 (two) times daily.      Marland Kitchen lisinopril (PRINIVIL,ZESTRIL) 20 MG tablet Take 20 mg by mouth daily.      Marland Kitchen NITROSTAT 0.4 MG SL tablet       . PROAIR HFA 108 (90 BASE) MCG/ACT inhaler        No current facility-administered medications for this visit.    Past Medical History  Diagnosis Date  . Primary myelofibrosis   . Diverticular disease   . Hemorrhoid   . CAD (coronary artery disease)     Cath [place], right coronary artery occluded, but may occluded, patent radial artery to the PDA, patent LIMA to the LAD. The LAD had 75-85% stenosis which was treated with stenting. There was residual 75% apical disease. Vein graft to marginal branch patent with a stent.  Marland Kitchen  Peripheral vascular disease   . DM (diabetes mellitus)   . Hyperlipidemia   . HTN (hypertension)     Past Surgical History  Procedure Laterality Date  . Coronary artery bypass graft  2000    LM occluded, LIMA to LAD patent but distal native LAD disease, saphenous vein bypass graft to OM with 95% stenosis in the midportion, right coronary artery occluded, radial artery graft to right coronary artery patent but was diffusely small. Marginal graft stented. 7 2014. Of note he had multiple previous stents in the right coronary artery. The distal LAD was treated with angioplasty. I  . Cardiac catheterization  May 2014  . Percutaneous coronary stent intervention (pci-s)  April 27 2013  . Vein surgery      ROS:  As  stated in the HPI and negative for all other systems.  PHYSICAL EXAM BP 124/80  Pulse 75  Ht 5\' 7"  (1.702 m)  Wt 196 lb 4.8 oz (89.041 kg)  BMI 30.74 kg/m2 GENERAL:  Well appearing HEENT:  Pupils equal round and reactive, fundi not visualized, oral mucosa unremarkable NECK:  No jugular venous distention, waveform within normal limits, carotid upstroke brisk and symmetric, no bruits, no thyromegaly LYMPHATICS:  No cervical, inguinal adenopathy LUNGS:  Clear to auscultation bilaterally BACK:  No CVA tenderness CHEST:  Well healed sternotomy scar. HEART:  PMI not displaced or sustained,S1 and S2 within normal limits, no S3, no S4, no clicks, no rubs, no murmurs ABD:  Flat, positive bowel sounds normal in frequency in pitch, no bruits, no rebound, no guarding, no midline pulsatile mass, no hepatomegaly, no splenomegaly EXT:  2 plus pulses upper, absent left radial, absent femoral, DP/PT, no edema, no cyanosis no clubbing SKIN:  No rashes no nodules   EKG:  Normal sinus rhythm, rate 90, premature atrial contractions, right bundle branch block, no acute ST-T wave changes. 03/18/2014   ASSESSMENT AND PLAN  CAD:  He has no active chest pain.  He will continue with risk reduction  HTN:  The blood pressure is at target. No change in medications is indicated. We will continue with therapeutic lifestyle changes (TLC).  DM:   Per [name], MD  .  A1C was 8.1  PVD:  He will be referred to Dr. [name]  HYPERLIPIDEMIA:  His LDL was 86.  I will increase Lipitor to 60 mg.

## 2014-03-19 ENCOUNTER — Other Ambulatory Visit (HOSPITAL_BASED_OUTPATIENT_CLINIC_OR_DEPARTMENT_OTHER): Payer: Medicare Other

## 2014-03-19 ENCOUNTER — Telehealth: Payer: Self-pay | Admitting: Internal Medicine

## 2014-03-19 ENCOUNTER — Encounter: Payer: Self-pay | Admitting: Internal Medicine

## 2014-03-19 ENCOUNTER — Ambulatory Visit (HOSPITAL_BASED_OUTPATIENT_CLINIC_OR_DEPARTMENT_OTHER): Payer: Medicare Other | Admitting: Internal Medicine

## 2014-03-19 VITALS — BP 141/61 | HR 67 | Temp 97.2°F | Resp 18 | Ht 67.0 in | Wt 195.2 lb

## 2014-03-19 DIAGNOSIS — E785 Hyperlipidemia, unspecified: Secondary | ICD-10-CM

## 2014-03-19 DIAGNOSIS — I10 Essential (primary) hypertension: Secondary | ICD-10-CM

## 2014-03-19 DIAGNOSIS — D7581 Myelofibrosis: Secondary | ICD-10-CM

## 2014-03-19 DIAGNOSIS — I739 Peripheral vascular disease, unspecified: Secondary | ICD-10-CM

## 2014-03-19 DIAGNOSIS — I2510 Atherosclerotic heart disease of native coronary artery without angina pectoris: Secondary | ICD-10-CM

## 2014-03-19 LAB — CBC WITH DIFFERENTIAL/PLATELET
BASO%: 0.9 % (ref 0.0–2.0)
Basophils Absolute: 0.2 10*3/uL — ABNORMAL HIGH (ref 0.0–0.1)
EOS%: 3 % (ref 0.0–7.0)
Eosinophils Absolute: 0.5 10*3/uL (ref 0.0–0.5)
HCT: 54 % — ABNORMAL HIGH (ref 38.4–49.9)
HGB: 17.1 g/dL (ref 13.0–17.1)
LYMPH%: 7.2 % — ABNORMAL LOW (ref 14.0–49.0)
MCH: 27.5 pg (ref 27.2–33.4)
MCHC: 31.6 g/dL — ABNORMAL LOW (ref 32.0–36.0)
MCV: 87.2 fL (ref 79.3–98.0)
MONO#: 0.8 10*3/uL (ref 0.1–0.9)
MONO%: 4.5 % (ref 0.0–14.0)
NEUT#: 14.6 10*3/uL — ABNORMAL HIGH (ref 1.5–6.5)
NEUT%: 84.4 % — ABNORMAL HIGH (ref 39.0–75.0)
Platelets: 437 10*3/uL — ABNORMAL HIGH (ref 140–400)
RBC: 6.2 10*6/uL — ABNORMAL HIGH (ref 4.20–5.82)
RDW: 21.3 % — ABNORMAL HIGH (ref 11.0–14.6)
WBC: 17.3 10*3/uL — ABNORMAL HIGH (ref 4.0–10.3)
lymph#: 1.2 10*3/uL (ref 0.9–3.3)

## 2014-03-19 LAB — BASIC METABOLIC PANEL (CC13)
Anion Gap: 9 mEq/L (ref 3–11)
BUN: 15.1 mg/dL (ref 7.0–26.0)
CO2: 28 mEq/L (ref 22–29)
Calcium: 9.3 mg/dL (ref 8.4–10.4)
Chloride: 104 mEq/L (ref 98–109)
Creatinine: 0.9 mg/dL (ref 0.7–1.3)
Glucose: 105 mg/dl (ref 70–140)
Potassium: 4.5 mEq/L (ref 3.5–5.1)
Sodium: 141 mEq/L (ref 136–145)

## 2014-03-19 LAB — LACTATE DEHYDROGENASE (CC13): LDH: 349 U/L — ABNORMAL HIGH (ref 125–245)

## 2014-03-19 MED ORDER — HYDROXYUREA 500 MG PO CAPS: 500.0000 mg | ORAL_CAPSULE | Freq: Two times a day (BID) | ORAL | Status: DC

## 2014-03-19 NOTE — Progress Notes (Signed)
[facility] OFFICE PROGRESS NOTE  THACKER,ROBERT KELLER, MD [address] [place] [address]  DIAGNOSIS: Myelofibrosis  Chief Complaint  Patient presents with  . Myelofibrosis    CURRENT TREATMENT:  Hydrea 500 mg twice daily.     Myelofibrosis   12/26/2012 Bone Marrow Biopsy Showed hypercellularity with reticulum fibrosis, JAK-2 mutation positive, BCR/ABL negative, 46 XY in 20 metaphase, and Intermediate-1 ,DIPSS of 2. (WBC < 25; Hbg > 10;    01/02/2013 Imaging US abdomen complete revealed Mild splenogmealy (13.3 cm) and a 2.2 x 1.7 x 1.8 cm left renal cyst   01/15/2013 Pathology Integrated hematopathology report.  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    03/17/2013 Imaging CT Chest: Crowded peribronchial markings noted, associated with linear densities involving the left base, probably representing areas of platelike atelectasis/scarring.  No mass or infiltrate seen.    03/19/2013 - 07/20/2013 Chemotherapy Started Jakafi 20 mg bid. Dose reduced due to anemia.  Stopped due to increasing fatigue while on jakafi.    04/28/2013 - 05/01/2013 Hospital Admission Admitted to [facility] by Dr. Jamali, his cardiologist, due to the fact that he had midsternal chest pain.  He had a coronary stent placed this admission.    05/28/2013 - 05/29/2013 Hospital Admission Admitted to [facility] and received 3 units of blood (Dr. Mendoza's note).    06/10/2013 Surgery Cololonoscopy due to GIB.  C/w diverticular disease and hemorrhoids but no active bleeding.    10/05/2013 -  Chemotherapy Started Hydrea 500 mg daily.     INTERVAL HISTORY: Tyler Whitehead 73 y.o. male who was referred for further evaluation by Dr. Thacker for his primary myelofibrosis and was  seen on 11/24/2011. He was last seen by me on 01/18/2014. Today,  he reports feeling increased energy.  He ambulates with a cane as needed.He endorses early satiety and mild fatigue. He denies any hematochezia or melana. His activities of daily living are performed without difficulty. He denies any kidney problems or chest pain or discomfort. He complains of right leg pain following increased ambulation.    MEDICAL HISTORY: Past Medical History  Diagnosis Date  . Primary myelofibrosis   . Diverticular disease   . Hemorrhoid   . CAD (coronary artery disease)     [facility] 2014, right coronary artery occluded, but may occluded, patent radial artery to the PDA, patent LIMA to the LAD. The LAD had 75-85% stenosis which was treated with stenting. There was residual 75% apical disease. Vein graft to marginal branch patent with a stent.  . Peripheral vascular disease   . DM (diabetes mellitus)   . Hyperlipidemia   . HTN (hypertension)     INTERIM HISTORY: has Myelofibrosis; Diverticular disease; Hemorrhoids; S/P CABG x 3; CAD (coronary artery disease); Peripheral vascular disease; DM2 (diabetes mellitus, type 2); Renal cyst, left; Splenomegaly; HTN (hypertension); and Dyslipidemia on his problem list.    ALLERGIES:  has No Known Allergies.  MEDICATIONS: has a current medication list which includes the following prescription(s): aspirin, atorvastatin, carvedilol, clopidogrel, hydroxyurea, insulin glargine, isosorbide mononitrate, linagliptin-metformin hcl, lisinopril, nitrostat, and proair hfa.  SURGICAL HISTORY:  Past Surgical History  Procedure Laterality Date  . Coronary artery bypass graft  2000    LM occluded, LIMA to LAD patent but distal native LAD disease, saphenous vein bypass graft to OM with 95% stenosis in the midportion, right coronary artery occluded,   radial artery graft to right coronary artery patent but was diffusely small. Marginal graft stented. 7 2014. Of note he had multiple previous stents in the right coronary artery. The distal LAD was  treated with angioplasty. I  . Cardiac catheterization  May 2014  . Percutaneous coronary stent intervention (pci-s)  April 27 2013  . Vein surgery      REVIEW OF SYSTEMS:   Constitutional: Denies fevers, chills or abnormal weight loss Eyes: Denies blurriness of vision Ears, nose, mouth, throat, and face: Denies mucositis or sore throat Respiratory: Denies cough, dyspnea or wheezes Cardiovascular: Denies palpitation, chest discomfort or lower extremity swelling Gastrointestinal:  Denies nausea, heartburn or change in bowel habits Skin: Denies abnormal skin rashes Lymphatics: Denies new lymphadenopathy or easy bruising Neurological:Denies numbness, tingling or new weaknesses Behavioral/Psych: Mood is stable, no new changes  All other systems were reviewed with the patient and are negative.  PHYSICAL EXAMINATION: ECOG PERFORMANCE STATUS: 1 - Symptomatic but completely ambulatory  Blood pressure 141/61, pulse 67, temperature 97.2 F (36.2 C), temperature source Oral, resp. rate 18, height 5' 7" (1.702 m), weight 195 lb 3.2 oz (88.542 kg).  GENERAL:alert, no distress and comfortable; elderly male who appears his stated age.  SKIN: skin color, texture, turgor are normal, no rashes or significant lesions EYES: normal, Conjunctiva are pink and non-injected, sclera clear OROPHARYNX:no exudate, no erythema and lips, buccal mucosa, and tongue normal  NECK: supple, thyroid normal size, non-tender, without nodularity LYMPH:  no palpable lymphadenopathy in the cervical, axillary or supraclavicular LUNGS: clear to auscultation with normal breathing effort, no wheezes or rhonchi HEART: regular rate & rhythm and no murmurs and no lower extremity edema ABDOMEN:abdomen soft, non-tender and normal bowel sounds Musculoskeletal:no cyanosis of digits and no clubbing  NEURO: alert & oriented x 3 with fluent speech, no focal motor/sensory deficits  Labs:  Lab Results  Component Value Date   WBC 17.3*  03/19/2014   HGB 17.1 03/19/2014   HCT 54.0* 03/19/2014   MCV 87.2 03/19/2014   PLT 437* 03/19/2014   NEUTROABS 14.6* 03/19/2014      Chemistry      Component Value Date/Time   NA 141 03/19/2014 0821   K 4.5 03/19/2014 0821   CO2 28 03/19/2014 0821   BUN 15.1 03/19/2014 0821   CREATININE 0.9 03/19/2014 0821      Component Value Date/Time   CALCIUM 9.3 03/19/2014 0821   ALKPHOS 104 11/23/2013 1030   AST 17 11/23/2013 1030   ALT 16 11/23/2013 1030   BILITOT 0.67 11/23/2013 1030       Basic Metabolic Panel:  Recent Labs Lab 03/19/14 0821  NA 141  K 4.5  CO2 28  GLUCOSE 105  BUN 15.1  CREATININE 0.9  CALCIUM 9.3   CBC:  Recent Labs Lab 03/19/14 0821  WBC 17.3*  NEUTROABS 14.6*  HGB 17.1  HCT 54.0*  MCV 87.2  PLT 437*    Studies:  No results found.   RADIOGRAPHIC STUDIES: No results found.  ASSESSMENT: Tyler Whitehead 73 y.o. male with a history of Myelofibrosis   PLAN:   1. Primary Myeloproliferative neoplasm (Myelofibrosis).  --Clinically, he is doing ok but he reports taking hydrea twice daily.  We have instructed him to increase this to  hydrea 500 mg bid. He was JAK2 positive but did not tolerate Jakafi due to worsening fatigue and anemia.  His plts today are 437 down from 473 and 553 on 01/18/14. His hemoglobin is 17.1. His WBCs   are 17.3 down 22.4 . He reports increased energy. His creatinine is within normal limits.  --We reviewed his imaging and bone marrow biopsy consistent with primary myelofibrosis intermediate-I, DIPSS of (Age greater than 65, WBC less than 25, hbg greater than 10). Indications to treat was to lower plts less than 400 to reduce chances of clots including CVA or DVT or MIs. Hydroxyurea may result in a reduction in spleen size, control of thrombocytosis and leukocytosis, and/or control of constitutional symptoms. People with positive JAK2 respond more favorable to hydrea. These benefits must be balanced with the risks associated with hydrea  including ulcers,myelosuppression, etc. Understanding these risks, he choose to continue with his hydrea 500 mg bid. He will have his complete blood count monitored closely while on therapy for titration.   2. CADz s/p CABG x 3, hypertension  --Continue lisinopril, coreg, aspirin   3. PvDz.  --Continue Imdur as it appearing to have improved some of his claudication symptoms.   4. Dyslipidemia.  --Continue statin therapy.   5. Follow-up.  --Patient will have labs drawn in 2 monts and return for symptom follow visit in 4 months with chemistries and cbc.   All questions were answered. The patient knows to call the clinic with any problems, questions or concerns. We can certainly see the patient much sooner if necessary.  I spent 10 minutes counseling the patient face to face. The total time spent in the appointment was 15 minutes.    , , MD 03/19/2014 9:14 AM  

## 2014-03-19 NOTE — Telephone Encounter (Signed)
Gave pt appt for lab and MD fotr August and October 2015

## 2014-03-22 ENCOUNTER — Telehealth: Payer: Self-pay | Admitting: Cardiovascular Disease

## 2014-03-22 ENCOUNTER — Telehealth (HOSPITAL_COMMUNITY): Payer: Self-pay | Admitting: *Deleted

## 2014-03-22 NOTE — Progress Notes (Signed)
Subjective:     Patient ID: Tyler Whitehead, male   DOB: 1941-05-22, 73 y.o.   MRN: [id number]  Toe Pain    long-term diabetic who presents with thick nailbeds that he cannot cut and concerns about numbness in the big toe of the right foot with possibility for diabetic implications.   Review of Systems  All other systems reviewed and are negative.      Objective:   Physical Exam  Nursing note and vitals reviewed. Musculoskeletal: Normal range of motion.  Neurological: He is alert.  Skin: Skin is dry.   vascular status found to be moderately diminished both PT and DP pulses with hair growth noted and mild clonus to the distal toes but within normal limits. Patient's found have diminishment of DTR reflexes vibratory and sharp dull sensation. 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 Will reappoint in 3 months or earlier if any issues should occur

## 2014-03-23 NOTE — Telephone Encounter (Signed)
Closed encounter °

## 2014-03-30 ENCOUNTER — Telehealth: Payer: Self-pay | Admitting: *Deleted

## 2014-03-30 NOTE — Telephone Encounter (Signed)
PA to [facility] for atorvastatin

## 2014-04-01 ENCOUNTER — Telehealth: Payer: Self-pay | Admitting: Cardiovascular Disease

## 2014-04-01 NOTE — Telephone Encounter (Signed)
Pharmacy notified-[facility]

## 2014-04-01 NOTE — Telephone Encounter (Signed)
Atorvastatin 1.5 tablets per day approved through 10/07/2014 by [facility]

## 2014-04-07 NOTE — Telephone Encounter (Signed)
Closed encounter °

## 2014-04-22 ENCOUNTER — Ambulatory Visit (HOSPITAL_COMMUNITY)
Admission: RE | Admit: 2014-04-22 | Discharge: 2014-04-22 | Disposition: A | Discharge status: Home / Self Care | Payer: Medicare Other | Source: Ambulatory Visit | Attending: Cardiology | Admitting: Cardiology

## 2014-04-22 DIAGNOSIS — I739 Peripheral vascular disease, unspecified: Secondary | ICD-10-CM | POA: Insufficient documentation

## 2014-04-22 NOTE — Progress Notes (Signed)
Carotid Duplex Completed. °Brianna L Mazza,RVT °

## 2014-04-23 ENCOUNTER — Ambulatory Visit (INDEPENDENT_AMBULATORY_CARE_PROVIDER_SITE_OTHER): Payer: Medicare Other | Admitting: Cardiovascular Disease

## 2014-04-23 ENCOUNTER — Encounter: Payer: Self-pay | Admitting: Cardiovascular Disease

## 2014-04-23 VITALS — BP 122/70 | HR 66 | Ht 67.0 in | Wt 197.4 lb

## 2014-04-23 DIAGNOSIS — R5381 Other malaise: Secondary | ICD-10-CM

## 2014-04-23 DIAGNOSIS — Z79899 Other long term (current) drug therapy: Secondary | ICD-10-CM

## 2014-04-23 DIAGNOSIS — D689 Coagulation defect, unspecified: Secondary | ICD-10-CM

## 2014-04-23 DIAGNOSIS — I739 Peripheral vascular disease, unspecified: Secondary | ICD-10-CM

## 2014-04-23 DIAGNOSIS — R5383 Other fatigue: Secondary | ICD-10-CM

## 2014-04-23 NOTE — Assessment & Plan Note (Signed)
Patient has a history of right lower extremity PAD with lifestyle limiting claudication. He did have a right femoropopliteal bypass graft 3 years ago in [place] which is subsequently occluded and then get lower Tyler Whitehead Doppler studies and arrange for him to undergo angiography to evaluate his potential options for revascularization.

## 2014-04-23 NOTE — Patient Instructions (Signed)
Dr. [name] has ordered a peripheral angiogram to be done at [facility].  This procedure is going to look at the bloodflow in your lower extremities.  If Dr. [name] is able to open up the arteries, you will have to spend one night in the hospital.  If he is not able to open the arteries, you will be able to go home that same day.    After the procedure, you will not be allowed to drive for 3 days or push, pull, or lift anything greater than 10 lbs for one week.    You will be required to have the following tests prior to the procedure:  1. Blood work-the blood work can be done no more than 7 days prior to the procedure.  It can be done at any [facility] lab.  There is one downstairs on the first floor of this building and one in the [place] ([address])  2. Chest Xray-the chest xray order has already been placed at the [facility].     Left groin  lower extremity arterial doppler (to be done prior to the angiogram)- During this test, ultrasound is used to evaluate arterial blood flow in the legs. Allow approximately one hour for this exam.

## 2014-04-23 NOTE — Progress Notes (Signed)
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 After his retirement he is moved to [place] for most of the time but continues to go back to the [place] area. Unfortunately in 2000 he developed chest pain symptoms and ultimately required three-vessel bypass. Although I do not have the operative report, a diagram from his cardiologist's office indicates a 3 vessel CABG with LIMA to LAD, left radial bypass graft to the PDA, and saphenous vein graft to the OM1. He did well with his bypass for many years up until 2014, when he developed recurrent chest pain. He ultimately had a stent put in the bypass graft to the OM1 branch. There was return of pain several weeks after this and was found to have obstruction distal to the LAD anastomosis of the LIMA and received a resolute drug-eluting stent to that area. He has been maintained on Plavix and aspirin since that time. His ejection fraction is apparently normal. He was followed by Dr. Dalbert Batman, his cardiologist in [place]. His CABG was at [facility] in [facility], [place]. he is also noted to have PAD, reportedly with a blockage in his right thigh . He has had right femoropopliteal bypass grafting 2 years ago however this has been shown to be occluded. He has right greater than left extremity claudication.   Current Outpatient Prescriptions  Medication Sig Dispense Refill  . aspirin 81 MG tablet Take 81 mg by mouth daily.      Marland Kitchen atorvastatin (LIPITOR) 40 MG tablet Take 1.5 tablets (60 mg total) by mouth daily.  135 tablet  3  . carvedilol (COREG) 3.125 MG tablet Take 3.125 mg by mouth 2 (two) times daily with a meal.      . clopidogrel (PLAVIX) 75 MG tablet Take 75 mg by mouth daily with breakfast.      . hydroxyurea (HYDREA) 500 MG  capsule Take 1 capsule (500 mg total) by mouth 2 (two) times daily.  60 capsule  3  . Insulin Glargine (LANTUS SOLOSTAR) 100 UNIT/ML Solostar Pen Inject 10 Units into the skin 2 (two) times daily.      . isosorbide mononitrate (IMDUR) 30 MG 24 hr tablet Take 30 mg by mouth daily.      . Linagliptin-Metformin HCl 2.5-500 MG TABS Take 1 tablet by mouth 2 (two) times daily.      Marland Kitchen lisinopril (PRINIVIL,ZESTRIL) 20 MG tablet Take 20 mg by mouth daily.      Marland Kitchen NITROSTAT 0.4 MG SL tablet Place 0.4 mg under the tongue as needed.       Marland Kitchen PROAIR HFA 108 (90 BASE) MCG/ACT inhaler Inhale 1 puff into the lungs every 4 (four) hours as needed.        No current facility-administered medications for this visit.    No Known Allergies  History   Social History  . Marital Status: Married    Spouse Name: N/A    Number of Children: N/A  . Years of Education: N/A   Occupational History  . Limo driver    Social History Main Topics  . Smoking status: Never Smoker   . Smokeless tobacco: Never Used  . Alcohol Use: No  . Drug Use: Not on file  . Sexual Activity: Not on file   Other Topics  Concern  . Not on file   Social History Narrative   Lives with wife.      Review of Systems: General: negative for chills, fever, night sweats or weight changes.  Cardiovascular: negative for chest pain, dyspnea on exertion, edema, orthopnea, palpitations, paroxysmal nocturnal dyspnea or shortness of breath Dermatological: negative for rash Respiratory: negative for cough or wheezing Urologic: negative for hematuria Abdominal: negative for nausea, vomiting, diarrhea, bright red blood per rectum, melena, or hematemesis Neurologic: negative for visual changes, syncope, or dizziness All other systems reviewed and are otherwise negative except as noted above.    Blood pressure 122/70, pulse 66, height 5\' 7"  (1.702 m), weight 197 lb 6.4 oz (89.54 kg).  General appearance: alert and no distress Neck: no  adenopathy, no carotid bruit, no JVD, supple, symmetrical, trachea midline and thyroid not enlarged, symmetric, no tenderness/mass/nodules Lungs: clear to auscultation bilaterally Heart: regular rate and rhythm, S1, S2 normal, no murmur, click, rub or gallop Extremities: extremities normal, atraumatic, no cyanosis or edema and absent right pedal pulse, 2+ left pedal pulse  EKG not performed today  ASSESSMENT AND PLAN:   Peripheral vascular disease Patient has a history of right lower extremity PAD with lifestyle limiting claudication. He did have a right femoropopliteal bypass graft 3 years ago in [place] which is subsequently occluded and then get lower [place] Doppler studies and arrange for him to undergo angiography to evaluate his potential options for revascularization.      Lorretta Harp MD FACP,FACC,FAHA, [facility] 04/23/2014 4:11 PM

## 2014-04-26 ENCOUNTER — Encounter: Payer: Self-pay | Admitting: Cardiovascular Disease

## 2014-05-03 ENCOUNTER — Other Ambulatory Visit: Payer: Self-pay | Admitting: Family

## 2014-05-03 DIAGNOSIS — R42 Dizziness and giddiness: Secondary | ICD-10-CM

## 2014-05-19 ENCOUNTER — Other Ambulatory Visit: Payer: Medicare Other

## 2014-05-19 ENCOUNTER — Encounter (HOSPITAL_COMMUNITY): Payer: Medicare Other

## 2014-05-19 ENCOUNTER — Telehealth: Payer: Self-pay | Admitting: Internal Medicine

## 2014-05-19 ENCOUNTER — Ambulatory Visit (INDEPENDENT_AMBULATORY_CARE_PROVIDER_SITE_OTHER): Payer: Medicare Other | Admitting: General Surgery

## 2014-05-19 NOTE — Telephone Encounter (Signed)
Pt cld to r/s labs, looking for better ins...Marland KitchenMarland KitchenKJ

## 2014-05-20 ENCOUNTER — Other Ambulatory Visit: Payer: Medicare Other

## 2014-06-08 ENCOUNTER — Other Ambulatory Visit (HOSPITAL_BASED_OUTPATIENT_CLINIC_OR_DEPARTMENT_OTHER): Payer: Medicare Other

## 2014-06-08 DIAGNOSIS — D7581 Myelofibrosis: Secondary | ICD-10-CM

## 2014-06-08 LAB — CBC WITH DIFFERENTIAL/PLATELET
BASO%: 1 % (ref 0.0–2.0)
Basophils Absolute: 0.2 10*3/uL — ABNORMAL HIGH (ref 0.0–0.1)
EOS%: 3.3 % (ref 0.0–7.0)
Eosinophils Absolute: 0.6 10*3/uL — ABNORMAL HIGH (ref 0.0–0.5)
HCT: 58.9 % — ABNORMAL HIGH (ref 38.4–49.9)
HGB: 18.7 g/dL — ABNORMAL HIGH (ref 13.0–17.1)
LYMPH%: 8.4 % — ABNORMAL LOW (ref 14.0–49.0)
MCH: 26.3 pg — ABNORMAL LOW (ref 27.2–33.4)
MCHC: 31.7 g/dL — ABNORMAL LOW (ref 32.0–36.0)
MCV: 82.8 fL (ref 79.3–98.0)
MONO#: 1 10*3/uL — ABNORMAL HIGH (ref 0.1–0.9)
MONO%: 5.5 % (ref 0.0–14.0)
NEUT#: 15.4 10*3/uL — ABNORMAL HIGH (ref 1.5–6.5)
NEUT%: 81.8 % — ABNORMAL HIGH (ref 39.0–75.0)
Platelets: 456 10*3/uL — ABNORMAL HIGH (ref 140–400)
RBC: 7.11 10*6/uL — ABNORMAL HIGH (ref 4.20–5.82)
RDW: 18.5 % — ABNORMAL HIGH (ref 11.0–14.6)
WBC: 18.8 10*3/uL — ABNORMAL HIGH (ref 4.0–10.3)
lymph#: 1.6 10*3/uL (ref 0.9–3.3)
nRBC: 1 % — ABNORMAL HIGH (ref 0–0)

## 2014-06-24 ENCOUNTER — Other Ambulatory Visit: Payer: Medicare Other

## 2014-06-28 ENCOUNTER — Telehealth: Payer: Self-pay | Admitting: Internal Medicine

## 2014-06-28 NOTE — Telephone Encounter (Signed)
pt called to sched sooner appt...done.Marland KitchenMarland Kitchen

## 2014-06-29 ENCOUNTER — Telehealth: Payer: Self-pay | Admitting: Hematology

## 2014-06-29 ENCOUNTER — Ambulatory Visit (HOSPITAL_BASED_OUTPATIENT_CLINIC_OR_DEPARTMENT_OTHER): Payer: Medicare Other | Admitting: Hematology

## 2014-06-29 ENCOUNTER — Ambulatory Visit (HOSPITAL_BASED_OUTPATIENT_CLINIC_OR_DEPARTMENT_OTHER): Payer: Medicare Other

## 2014-06-29 ENCOUNTER — Telehealth: Payer: Self-pay | Admitting: Cardiovascular Disease

## 2014-06-29 ENCOUNTER — Encounter: Payer: Self-pay | Admitting: Hematology

## 2014-06-29 VITALS — BP 137/71 | HR 72 | Temp 97.7°F | Resp 18 | Ht 67.0 in | Wt 191.3 lb

## 2014-06-29 DIAGNOSIS — D45 Polycythemia vera: Secondary | ICD-10-CM

## 2014-06-29 DIAGNOSIS — E785 Hyperlipidemia, unspecified: Secondary | ICD-10-CM

## 2014-06-29 DIAGNOSIS — I2510 Atherosclerotic heart disease of native coronary artery without angina pectoris: Secondary | ICD-10-CM

## 2014-06-29 DIAGNOSIS — D47Z9 Other specified neoplasms of uncertain behavior of lymphoid, hematopoietic and related tissue: Secondary | ICD-10-CM | POA: Diagnosis not present

## 2014-06-29 DIAGNOSIS — Z7982 Long term (current) use of aspirin: Secondary | ICD-10-CM

## 2014-06-29 NOTE — Progress Notes (Signed)
Tyler Whitehead 06/29/2014  Tyler Copa, MD [phone] [address]. [address] [place] [address]  DIAGNOSIS: Myelofibrosis/Myeloproliferative disorder (Overlap). Today he is symptomatic with Polycythemia. JAK2+.  Chief Complaint  Patient presents with  . Follow-up    CURRENT TREATMENT:    1.Hydrea 500 mg twice daily. 2. Aspirin daily. 3. Therapeutic Phlebotomy weekly  x 4 starting today.     Myelofibrosis   12/26/2012 Bone Marrow Biopsy Showed hypercellularity with reticulum fibrosis, JAK-2 mutation positive, BCR/ABL negative, 22 XY in 20 metaphase, and Intermediate-1 ,DIPSS of 2. (WBC < 25; Hbg > 10;    01/02/2013 Imaging US abdomen complete revealed Mild splenogmealy (13.3 cm) and a 2.2 x 1.7 x 1.8 cm left renal cyst   01/15/2013 Pathology Results Integrated hematopathology report.  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    03/17/2013 Imaging CT Chest: Crowded peribronchial markings noted, associated with linear densities involving the left base, probably representing areas of platelike atelectasis/scarring.  No mass or infiltrate seen.    03/19/2013 - 07/20/2013 Chemotherapy Started Jakafi 20 mg bid. Dose reduced due to anemia.  Stopped due to increasing fatigue while on jakafi.    04/28/2013 - 05/01/2013 Hospital Admission Admitted to [facility] by Tyler Whitehead, his cardiologist, due to the fact that he had midsternal chest pain.  He had a coronary stent placed this admission.    05/28/2013 - 05/29/2013 Hospital Admission Admitted to [facility] and received 3 units of blood (Tyler Whitehead Whitehead).    06/10/2013 Surgery Cololonoscopy due to GIB.  C/w diverticular disease and hemorrhoids but no active bleeding.    10/05/2013 -  Chemotherapy Started Hydrea 500 mg daily.   03/19/2014 -   Chemotherapy Hydrea increased to 500 mg bid wbc 17, hb 17, hct 54, plt 437   06/29/2014 Procedure cbc shows wbc 18.8 hb 18.7 hct 58.9 plt 456. ?compliance w hydrea as MCV not elevated. symptomatic start TP weekly x 4     INTERVAL HISTORY: Tyler Whitehead 73 y.o. male who was referred for further evaluation by Tyler Whitehead for his primary myelofibrosis and was  seen on 11/24/2011 and again on 03/19/2014 by Tyler Whitehead. Today, he reports feeling tired.  He ambulates with a cane as needed.He endorses early satiety and mild fatigue. He denies any hematochezia or melana. His activities of daily living are performed without difficulty. He denies any kidney problems or chest pain or discomfort. He complains of right leg pain following increased ambulation.  He is having some blurring of vision and floaters in eyes, eye exam was normal like because of thick blood. Blood sugars also run 200-300 range. He complains of decreased appetite. CBC shows the following:     MEDICAL HISTORY: Past Medical History  Diagnosis Date  . Primary myelofibrosis   . Diverticular disease   . Hemorrhoid   . CAD (coronary artery disease)     Cath [place], right coronary artery occluded, but may occluded, patent radial artery to the PDA, patent LIMA to the LAD. The LAD had 75-85% stenosis which was treated with stenting. There was residual 75% apical disease. Vein graft to marginal branch patent with a stent.  . Peripheral vascular disease   . DM (diabetes mellitus)   . Hyperlipidemia   . HTN (hypertension)     INTERIM HISTORY: has Myelofibrosis; Diverticular disease; Hemorrhoids; S/P CABG x 3; CAD (coronary  artery disease); Peripheral vascular disease; DM2 (diabetes mellitus, type 2); Renal cyst, left; Splenomegaly; HTN (hypertension); and Dyslipidemia on his problem list.    ALLERGIES:  has No Known Allergies.  MEDICATIONS: has a current medication list which includes the following prescription(s): aspirin,  atorvastatin, carvedilol, clopidogrel, hydroxyurea, insulin glargine, isosorbide mononitrate, linagliptin-metformin hcl, lisinopril, nitrostat, and proair hfa.  SURGICAL HISTORY:  Past Surgical History  Procedure Laterality Date  . Coronary artery bypass graft  2000    LM occluded, LIMA to LAD patent but distal native LAD disease, saphenous vein bypass graft to OM with 95% stenosis in the midportion, right coronary artery occluded, radial artery graft to right coronary artery patent but was diffusely small. Marginal graft stented. 7 2014. Of Whitehead he had multiple previous stents in the right coronary artery. The distal LAD was treated with angioplasty. I  . Cardiac catheterization  May 2014  . Percutaneous coronary stent intervention (pci-s)  April 27 2013  . Vein surgery      REVIEW OF SYSTEMS:   Constitutional: Denies fevers, chills or abnormal weight loss Eyes: Admits to blurriness of vision and floaters Ears, nose, mouth, throat, and face: Denies mucositis or sore throat Respiratory: Denies cough, dyspnea or wheezes Cardiovascular: Denies palpitation, chest discomfort or lower extremity swelling Gastrointestinal:  Denies nausea, heartburn or change in bowel habits Skin: Denies abnormal skin rashes Lymphatics: Denies new lymphadenopathy or easy bruising Neurological:Denies numbness, tingling or new weaknesses Behavioral/Psych: Mood is stable, no new changes  All other systems were reviewed with the patient and are negative.  PHYSICAL EXAMINATION: ECOG PERFORMANCE STATUS: 0  Blood pressure 137/71, pulse 72, temperature 97.7 F (36.5 C), temperature source Oral, resp. rate 18, height '5\' 7"'  (1.702 m), weight 191 lb 4.8 oz (86.773 kg).  GENERAL:alert, no distress and comfortable; elderly male who appears his stated age.  SKIN: skin color, texture, turgor are normal, no rashes or significant lesions EYES: normal, Conjunctiva are pink and non-injected, sclera clear OROPHARYNX:no exudate,  no erythema and lips, buccal mucosa, and tongue normal  NECK: supple, thyroid normal size, non-tender, without nodularity LYMPH:  no palpable lymphadenopathy in the cervical, axillary or supraclavicular LUNGS: clear to auscultation with normal breathing effort, no wheezes or rhonchi HEART: regular rate & rhythm and no murmurs and no lower extremity edema ABDOMEN:abdomen soft, non-tender and normal bowel sounds Musculoskeletal:no cyanosis of digits and no clubbing  NEURO: alert & oriented x 3 with fluent speech, no focal motor/sensory deficits  Labs:  Lab Results  Component Value Date   WBC 18.8* 06/08/2014   HGB 18.7* 06/08/2014   HCT 58.9* 06/08/2014   MCV 82.8 06/08/2014   PLT 456* 06/08/2014   NEUTROABS 15.4* 06/08/2014      Chemistry      Component Value Date/Time   NA 141 03/19/2014 0821   K 4.5 03/19/2014 0821   CO2 28 03/19/2014 0821   BUN 15.1 03/19/2014 0821   CREATININE 0.9 03/19/2014 0821      Component Value Date/Time   CALCIUM 9.3 03/19/2014 0821   ALKPHOS 104 11/23/2013 1030   AST 17 11/23/2013 1030   ALT 16 11/23/2013 1030   BILITOT 0.67 11/23/2013 1030       RADIOGRAPHIC STUDIES: No results found.  ASSESSMENT: [name] 73 y.o. male with a history of Myelofibrosis, jak 2 mutation and symptomatic polycythemia. Wbc and platelets are high as well. He told nursing staff that not taking medications and want to cut down more medications, MCV normal a sign  not taking hydrea and when i confronted him he said instead of taking twice a day he takes all at one time. I suspect Non-compliance of hydrea.  PLAN:   1. Primary Myeloproliferative neoplasm (Myelofibrosis).  --Clinically, he is doing ok but he reports taking hydrea twice daily.  He has been  instructed him to increase this to  hydrea 500 mg bid. He was JAK2 positive but did not tolerate Jakafi due to worsening fatigue and anemia.  His plts today are 456 down from 473 and 553 on 01/18/14. His hemoglobin is 18.7. His WBCs are  18.8 down 22.4 . He reports increased energy. His creatinine is within normal limits.  --We reviewed his imaging and bone marrow biopsy consistent with primary myelofibrosis intermediate-I, DIPSS of (Age greater than 81, WBC less than 25, hbg greater than 10). Indications to treat was to lower plts less than 400 to reduce chances of clots including CVA or DVT or MIs. Hydroxyurea may result in a reduction in spleen size, control of thrombocytosis and leukocytosis, and/or control of constitutional symptoms. People with positive JAK2 respond more favorable to hydrea. These benefits must be balanced with the risks associated with hydrea including ulcers,myelosuppression, etc. Understanding these risks, he choose to continue with his hydrea 500 mg bid. He will have his complete blood count monitored closely while on therapy for titration.  --As he is symptomatic with polycythemia, I will institute weekly TP X 4 WEEKS and repeat his CBC and do a clinic visit in 4 weeks. He starts TP today. EMPHASIZED TO HIM ABOUT TAKING HYDREA AND ASPIRIN AS OTHERWISE HE PUTS HIMSELF TO RISK OF CARDIOVASCULAR EVENTS LIKE DVT, TIA,CVA, MI etc  2. CADz s/p CABG x 3, hypertension  --Continue lisinopril, coreg, aspirin   3. PvDz.  --Continue Imdur as it appearing to have improved some of his claudication symptoms.   4. Dyslipidemia.  --Continue statin therapy.   5. Follow-up.  --Patient will have labs drawn in 1 MONTH and return for symptom follow visit in 1 month with chemistries and cbc.   All questions were answered. The patient knows to call the clinic with any problems, questions or concerns. We can certainly see the patient much sooner if necessary.  I spent 30 minutes counseling the patient face to face. The total time spent in the appointment was 30 minutes.    Tyler Bell, MD Medical Hematologist/Oncologist [place] Pager: [phone] Office No: [phone]

## 2014-06-29 NOTE — Telephone Encounter (Signed)
New message           Anderson Malta PA would like pt to be seen by Dr [name] this week for: Severe cp for 2 days associated with dyspnea / history of CAD / Please give their office a call

## 2014-06-29 NOTE — Patient Instructions (Addendum)
Therapeutic Phlebotomy, Care After Refer to this sheet in the next few weeks. These instructions provide you with information on caring for yourself after your procedure. Your caregiver may also give you more specific instructions. Your treatment has been planned according to current medical practices, but problems sometimes occur. Call your caregiver if you have any problems or questions after your procedure. HOME CARE INSTRUCTIONS Most people can go back to their normal activities right away. Before you leave, be sure to ask if there is anything you should or should not do. In general, it would be wise to:  Keep the bandage dry. You can remove the bandage after about 5 hours.  Eat well-balanced meals for the next 24 hours.  Drink enough fluids to keep your urine clear or pale yellow.  Avoid drinking alcohol minimally until after eating.  Avoid smoking for at least 30 minutes after the procedure.  Avoid strenuous physical activity or heavy lifting or pulling for about 5 hours after the procedure.  Athletes should avoid strenuous exercise for 12 hours or more.  Change positions slowly for the remainder of the day to prevent light-headedness or fainting.  If you feel light-headed, lie down until the feeling subsides.  If you have bleeding from the needle insertion site, elevate your arm and press firmly on the site until the bleeding stops.  If bruising or bleeding appears under the skin, apply ice to the area for 15 to 20 minutes, 3 to 4 times per day. Put the ice in a plastic bag and place a towel between the bag of ice and your skin. Do this while you are awake for the first 24 hours. The ice packs can be stopped before 24 hours if the swelling goes away. If swelling persists after 24 hours, a warm, moist washcloth can be applied to the area for 15 to 20 minutes, 3 to 4 times per day. The warm, moist treatments can be stopped when the swelling goes away.  It is important to continue  further therapeutic phlebotomy as directed by your caregiver. SEEK MEDICAL CARE IF:  There is bleeding or fluid leaking from the needle insertion site.  The needle insertion site becomes swollen, red, or sore.  You feel light-headed, dizzy or nauseated, and the feeling does not go away.  You notice new bruising at the needle insertion site.  You feel more weak or tired than normal.  You develop a fever. SEEK IMMEDIATE MEDICAL CARE IF:   There is increased bleeding, pain, or swelling from the needle insertion site.  You have severe nausea or vomiting.  You have chest pain.  You have trouble breathing. MAKE SURE YOU:  Understand these instructions.  Will watch your condition.  Will get help right away if you are not doing well or get worse. Document Released: 02/26/2011 Document Revised: 02/08/2014 Document Reviewed: 02/26/2011 ExitCare Patient Information 2015 [employer]. This information is not intended to replace advice given to you by your health care provider. Make sure you discuss any questions you have with your health care provider.   Therapeutic Phlebotomy Therapeutic phlebotomy is the controlled removal of blood from your body for the purpose of treating a medical condition. It is similar to donating blood. Usually, about a pint (470 mL) of blood is removed. The average adult has 9 to 12 pints (4.3 to 5.7 L) of blood. Therapeutic phlebotomy may be used to treat the following medical conditions:  Hemochromatosis. This is a condition in which there is too   much iron in the blood.  Polycythemia vera. This is a condition in which there are too many red cells in the blood.  Porphyria cutanea tarda. This is a disease usually passed from one generation to the next (inherited). It is a condition in which an important part of hemoglobin is not made properly. This results in the build up of abnormal amounts of porphyrins in the body.  Sickle cell disease. This is an  inherited disease. It is a condition in which the red blood cells form an abnormal crescent shape rather than a round shape. LET YOUR CAREGIVER KNOW ABOUT:  Allergies.  Medicines taken including herbs, eyedrops, over-the-counter medicines, and creams.  Use of steroids (by mouth or creams).  Previous problems with anesthetics or numbing medicine.  History of blood clots.  History of bleeding or blood problems.  Previous surgery.  Possibility of pregnancy, if this applies. RISKS AND COMPLICATIONS This is a simple and safe procedure. Problems are unlikely. However, problems can occur and may include:  Nausea or lightheadedness.  Low blood pressure.  Soreness, bleeding, swelling, or bruising at the needle insertion site.  Infection. BEFORE THE PROCEDURE  This is a procedure that can be done as an outpatient. Confirm the time that you need to arrive for your procedure. Confirm whether there is a need to fast or withhold any medications. It is helpful to wear clothing with sleeves that can be raised above the elbow. A blood sample may be done to determine the amount of red blood cells or iron in your blood. Plan ahead of time to have someone drive you home after the procedure. PROCEDURE The entire procedure from preparation through recovery takes about 1 hour. The actual collection takes about 10 to 15 minutes.  A needle will be inserted into your vein.  Tubing and a collection bag will be attached to that needle.  Blood will flow through the needle and tubing into the collection bag.  You may be asked to open and close your hand slowly and continuously during the entire collection.  Once the specified amount of blood has been removed from your body, the collection bag and tubing will be clamped.  The needle will be removed.  Pressure will be held on the site of the needle insertion to stop the bleeding. Then a bandage will be placed over the needle insertion site. AFTER THE  PROCEDURE  Your recovery will be assessed and monitored. If there are no problems, as an outpatient, you should be able to go home shortly after the procedure.  Document Released: 02/26/2011 Document Revised: 12/17/2011 Document Reviewed: 02/26/2011 ExitCare Patient Information 2015 [employer]. This information is not intended to replace advice given to you by your health care provider. Make sure you discuss any questions you have with your health care provider.  

## 2014-06-29 NOTE — Patient Instructions (Signed)
INSTRUCTED HIM TO BE COMPLIANT WITH HYDREA AND OTHER MEDICATIONS

## 2014-06-29 NOTE — Telephone Encounter (Signed)
Lft msg for pt per 09/22 POF labs/ov added and sent msg to add TP, mailed sch to pt.......KJ

## 2014-06-29 NOTE — Telephone Encounter (Signed)
FORWARD TO DR Collie Siad RN

## 2014-06-30 ENCOUNTER — Ambulatory Visit (INDEPENDENT_AMBULATORY_CARE_PROVIDER_SITE_OTHER): Payer: Medicare Other | Admitting: Cardiology

## 2014-06-30 ENCOUNTER — Encounter (HOSPITAL_COMMUNITY): Payer: Self-pay | Admitting: Emergency Medicine

## 2014-06-30 ENCOUNTER — Telehealth: Payer: Self-pay | Admitting: *Deleted

## 2014-06-30 ENCOUNTER — Emergency Department (HOSPITAL_COMMUNITY)
Admission: EM | Admit: 2014-06-30 | Discharge: 2014-06-30 | Disposition: A | Discharge status: Home / Self Care | Payer: Medicare Other | Attending: Emergency Medicine | Admitting: Emergency Medicine

## 2014-06-30 ENCOUNTER — Emergency Department (HOSPITAL_COMMUNITY): Payer: Medicare Other

## 2014-06-30 ENCOUNTER — Encounter: Payer: Self-pay | Admitting: Cardiology

## 2014-06-30 VITALS — BP 132/60 | HR 81 | Ht 67.0 in | Wt 191.3 lb

## 2014-06-30 DIAGNOSIS — I739 Peripheral vascular disease, unspecified: Secondary | ICD-10-CM | POA: Diagnosis not present

## 2014-06-30 DIAGNOSIS — E785 Hyperlipidemia, unspecified: Secondary | ICD-10-CM | POA: Insufficient documentation

## 2014-06-30 DIAGNOSIS — Z8719 Personal history of other diseases of the digestive system: Secondary | ICD-10-CM | POA: Insufficient documentation

## 2014-06-30 DIAGNOSIS — I10 Essential (primary) hypertension: Secondary | ICD-10-CM | POA: Diagnosis not present

## 2014-06-30 DIAGNOSIS — Z7902 Long term (current) use of antithrombotics/antiplatelets: Secondary | ICD-10-CM | POA: Insufficient documentation

## 2014-06-30 DIAGNOSIS — Z794 Long term (current) use of insulin: Secondary | ICD-10-CM | POA: Insufficient documentation

## 2014-06-30 DIAGNOSIS — Z79899 Other long term (current) drug therapy: Secondary | ICD-10-CM | POA: Insufficient documentation

## 2014-06-30 DIAGNOSIS — I2510 Atherosclerotic heart disease of native coronary artery without angina pectoris: Secondary | ICD-10-CM | POA: Insufficient documentation

## 2014-06-30 DIAGNOSIS — E119 Type 2 diabetes mellitus without complications: Secondary | ICD-10-CM | POA: Insufficient documentation

## 2014-06-30 DIAGNOSIS — R51 Headache: Secondary | ICD-10-CM

## 2014-06-30 DIAGNOSIS — R519 Headache, unspecified: Secondary | ICD-10-CM | POA: Insufficient documentation

## 2014-06-30 DIAGNOSIS — Z862 Personal history of diseases of the blood and blood-forming organs and certain disorders involving the immune mechanism: Secondary | ICD-10-CM | POA: Insufficient documentation

## 2014-06-30 DIAGNOSIS — H538 Other visual disturbances: Secondary | ICD-10-CM | POA: Insufficient documentation

## 2014-06-30 DIAGNOSIS — Z7982 Long term (current) use of aspirin: Secondary | ICD-10-CM | POA: Insufficient documentation

## 2014-06-30 DIAGNOSIS — Z9889 Other specified postprocedural states: Secondary | ICD-10-CM | POA: Insufficient documentation

## 2014-06-30 DIAGNOSIS — Z951 Presence of aortocoronary bypass graft: Secondary | ICD-10-CM | POA: Insufficient documentation

## 2014-06-30 LAB — CBC WITH DIFFERENTIAL/PLATELET
Basophils Absolute: 0.1 10*3/uL (ref 0.0–0.1)
Basophils Relative: 1 % (ref 0–1)
Eosinophils Absolute: 0.3 10*3/uL (ref 0.0–0.7)
Eosinophils Relative: 2 % (ref 0–5)
HCT: 55.3 % — ABNORMAL HIGH (ref 39.0–52.0)
Hemoglobin: 18.1 g/dL — ABNORMAL HIGH (ref 13.0–17.0)
Lymphocytes Relative: 6 % — ABNORMAL LOW (ref 12–46)
Lymphs Abs: 1 10*3/uL (ref 0.7–4.0)
MCH: 25.9 pg — ABNORMAL LOW (ref 26.0–34.0)
MCHC: 32.7 g/dL (ref 30.0–36.0)
MCV: 79.1 fL (ref 78.0–100.0)
Monocytes Absolute: 0.8 10*3/uL (ref 0.1–1.0)
Monocytes Relative: 5 % (ref 3–12)
Neutro Abs: 14.5 10*3/uL — ABNORMAL HIGH (ref 1.7–7.7)
Neutrophils Relative %: 87 % — ABNORMAL HIGH (ref 43–77)
Platelets: 615 10*3/uL — ABNORMAL HIGH (ref 150–400)
RBC: 6.99 MIL/uL — ABNORMAL HIGH (ref 4.22–5.81)
RDW: 18.8 % — ABNORMAL HIGH (ref 11.5–15.5)
WBC: 16.6 10*3/uL — ABNORMAL HIGH (ref 4.0–10.5)

## 2014-06-30 LAB — BASIC METABOLIC PANEL
Anion gap: 10 (ref 5–15)
BUN: 13 mg/dL (ref 6–23)
CO2: 26 mEq/L (ref 19–32)
Calcium: 9.7 mg/dL (ref 8.4–10.5)
Chloride: 99 mEq/L (ref 96–112)
Creatinine, Ser: 0.94 mg/dL (ref 0.50–1.35)
GFR calc Af Amer: >90 mL/min (ref >90)
GFR calc non Af Amer: 82 mL/min — ABNORMAL LOW (ref >90)
Glucose, Bld: 430 mg/dL — ABNORMAL HIGH (ref 70–99)
Potassium: 4.9 mEq/L (ref 3.7–5.3)
Sodium: 135 mEq/L — ABNORMAL LOW (ref 137–147)

## 2014-06-30 MED ORDER — ONDANSETRON 4 MG PO TBDP
8.0000 mg | ORAL_TABLET | Freq: Once | ORAL | Status: AC
  Administered 2014-06-30: 8 mg via ORAL
  Filled 2014-06-30: qty 2

## 2014-06-30 MED ORDER — MORPHINE SULFATE 4 MG/ML IJ SOLN
4.0000 mg | Freq: Once | INTRAMUSCULAR | Status: AC
  Administered 2014-06-30: 4 mg via INTRAVENOUS
  Filled 2014-06-30: qty 1

## 2014-06-30 MED ORDER — HYDROCODONE-ACETAMINOPHEN 5-325 MG PO TABS: 1.0000 | ORAL_TABLET | Freq: Four times a day (QID) | ORAL | Status: DC | PRN

## 2014-06-30 NOTE — Patient Instructions (Signed)
Your physician recommends that you schedule a follow-up appointment  You should already have a follow up appt.

## 2014-06-30 NOTE — ED Provider Notes (Signed)
CSN: [id number]     Arrival date & time 06/30/14  1305 History   First MD Initiated Contact with Patient 06/30/14 1605     Chief Complaint  Patient presents with  . Headache     (Consider location/radiation/quality/duration/timing/severity/associated sxs/prior Treatment) Patient is a 73 y.o. male presenting with headaches. The history is provided by the patient.  Headache Pain location:  Frontal (Left) Quality:  Dull Radiates to:  Does not radiate Severity currently:  6/10 Severity at highest:  9/10 Onset quality:  Gradual Duration:  4 weeks Timing:  Intermittent Progression:  Worsening Chronicity:  New Similar to prior headaches: no   Context: not intercourse, not loud noise and not straining   Relieved by:  Nothing Worsened by:  Nothing tried Ineffective treatments:  None tried Associated symptoms: no cough and no fever     Past Medical History  Diagnosis Date  . Primary myelofibrosis   . Diverticular disease   . Hemorrhoid   . CAD (coronary artery disease)     Cath [place], right coronary artery occluded, but may occluded, patent radial artery to the PDA, patent LIMA to the LAD. The LAD had 75-85% stenosis which was treated with stenting. There was residual 75% apical disease. Vein graft to marginal branch patent with a stent.  . Peripheral vascular disease   . DM (diabetes mellitus)   . Hyperlipidemia   . HTN (hypertension)    Past Surgical History  Procedure Laterality Date  . Coronary artery bypass graft  2000    LM occluded, LIMA to LAD patent but distal native LAD disease, saphenous vein bypass graft to OM with 95% stenosis in the midportion, right coronary artery occluded, radial artery graft to right coronary artery patent but was diffusely small. Marginal graft stented. 7 2014. Of note he had multiple previous stents in the right coronary artery. The distal LAD was treated with angioplasty. I  . Cardiac catheterization  May 2014  . Percutaneous coronary  stent intervention (pci-s)  April 27 2013  . Vein surgery     Family History  Problem Relation Age of Onset  . Hyperlipidemia Maternal Grandmother   . Asthma Father   . CAD      Multiple siblings   History  Substance Use Topics  . Smoking status: Never Smoker   . Smokeless tobacco: Never Used  . Alcohol Use: No    Review of Systems  Constitutional: Negative for fever.  Eyes: Positive for visual disturbance (flashing lights).  Respiratory: Negative for cough and shortness of breath.   Neurological: Positive for headaches.  All other systems reviewed and are negative.     Allergies  Review of patient's allergies indicates no known allergies.  Home Medications   Prior to Admission medications   Medication Sig Start Date End Date Taking? Authorizing Provider  aspirin 81 MG tablet Take 81 mg by mouth daily.   Yes Historical Provider, MD  atorvastatin (LIPITOR) 40 MG tablet Take 1.5 tablets (60 mg total) by mouth daily. 03/18/14  Yes Minus Breeding, MD  carvedilol (COREG) 6.25 MG tablet Take 6.25 mg by mouth 2 (two) times daily with a meal.   Yes Historical Provider, MD  cholecalciferol (VITAMIN D) 1000 UNITS tablet Take 1,000 Units by mouth daily.   Yes Historical Provider, MD  clopidogrel (PLAVIX) 75 MG tablet Take 75 mg by mouth daily with breakfast.   Yes Historical Provider, MD  hydroxyurea (HYDREA) 500 MG capsule Take 1 capsule (500 mg total) by mouth 2 (  two) times daily. 03/19/14  Yes Concha Norway, MD  ibuprofen (ADVIL,MOTRIN) 200 MG tablet Take 400 mg by mouth every 6 (six) hours as needed for headache.   Yes Historical Provider, MD  Insulin Glargine (LANTUS SOLOSTAR) 100 UNIT/ML Solostar Pen Inject 10 Units into the skin 2 (two) times daily.   Yes Historical Provider, MD  isosorbide mononitrate (IMDUR) 30 MG 24 hr tablet Take 30 mg by mouth daily.   Yes Historical Provider, MD  Linagliptin-Metformin HCl 2.5-500 MG TABS Take 1 tablet by mouth 2 (two) times daily.   Yes  Historical Provider, MD  lisinopril (PRINIVIL,ZESTRIL) 20 MG tablet Take 20 mg by mouth daily.   Yes Historical Provider, MD  NITROSTAT 0.4 MG SL tablet Place 0.4 mg under the tongue every 5 (five) minutes as needed for chest pain.  10/09/13  Yes Historical Provider, MD  PROAIR HFA 108 (90 BASE) MCG/ACT inhaler Inhale 1 puff into the lungs every 4 (four) hours as needed.  12/03/13  Yes Historical Provider, MD   BP 149/68  Pulse 75  Temp(Src) 97.7 F (36.5 C) (Oral)  Resp 16  SpO2 99% Physical Exam  Nursing note and vitals reviewed. Constitutional: He is oriented to person, place, and time. He appears well-developed and well-nourished. No distress.  HENT:  Head: Normocephalic and atraumatic.  Mouth/Throat: Oropharynx is clear and moist. No oropharyngeal exudate.  Eyes: EOM are normal. Pupils are equal, round, and reactive to light.  Neck: Normal range of motion. Neck supple.  Cardiovascular: Normal rate and regular rhythm.  Exam reveals no friction rub.   No murmur heard. Pulmonary/Chest: Effort normal and breath sounds normal. No respiratory distress. He has no wheezes. He has no rales.  Abdominal: Soft. He exhibits no distension. There is no tenderness. There is no rebound.  Musculoskeletal: Normal range of motion. He exhibits no edema.  Neurological: He is alert and oriented to person, place, and time. No cranial nerve deficit. He exhibits normal muscle tone. Coordination normal.  Skin: No rash noted. He is not diaphoretic.    ED Course  Procedures (including critical care time) Labs Review Labs Reviewed  CBC WITH DIFFERENTIAL - Abnormal; Notable for the following:    WBC 16.6 (*)    RBC 6.99 (*)    Hemoglobin 18.1 (*)    HCT 55.3 (*)    MCH 25.9 (*)    RDW 18.8 (*)    Platelets 615 (*)    Neutrophils Relative % 87 (*)    Neutro Abs 14.5 (*)    Lymphocytes Relative 6 (*)    All other components within normal limits  BASIC METABOLIC PANEL - Abnormal; Notable for the  following:    Sodium 135 (*)    Glucose, Bld 430 (*)    GFR calc non Af Amer 82 (*)    All other components within normal limits    Imaging Review Ct Head Wo Contrast  06/30/2014   CLINICAL DATA:  Headache for several days  EXAM: CT HEAD WITHOUT CONTRAST  TECHNIQUE: Contiguous axial images were obtained from the base of the skull through the vertex without intravenous contrast.  COMPARISON:  None.  FINDINGS: No acute intracranial hemorrhage. No focal mass lesion. No CT evidence of acute infarction. No midline shift or mass effect. No hydrocephalus. Basilar cisterns are patent. Mild periventricular white matter hypodensities. Paranasal sinuses and mastoid air cells are clear.  IMPRESSION: No acute intracranial findings. Mild white matter microvascular disease.   Electronically Signed   By: Nicole Kindred  Leonia Reeves M.D.   On: 06/30/2014 18:13     EKG Interpretation None      MDM   Final diagnoses:  Acute nonintractable headache, unspecified headache type    55M here with headaches. Present for past 3-4 weeks, worsening. His oncologist believes he is symptomatic from his PCV. Seen by the oncologist yesterday, had 1 pint of blood phlebotomized for this. Sent here by Cardiology after being seen in Cards clinic and telling them he had headaches. AFVSS here. Nonfocal neuro exam. Will CT his head. Labs c/w polycythemia. Has flashing lights in his periphery, has seen Ophtho previously for this. I spoke with Dr. Lindi Adie with Oncology, states will need phlebotomy again sooner. We cannot phlebotomize in the ED. Feeling better with narcotics, CT of Head normal. Discussed admission vs discharge for phlebotomy, patient would rather go home. Dr. Lindi Adie with Onc is amenable to this, will help arrange f/u in the next few days.  Evelina Bucy, MD 06/30/14 [phone]

## 2014-06-30 NOTE — Progress Notes (Signed)
HPI The patient presents for evaluation of chest pain. Tyler Whitehead  He has a complicated past history of CAD with treatment in [place].  He has CABG and PVD.   I have reveiwed the outside records previously.  His last cath in [place] suggested that "Medical therapy should be enhanced. If the patient has ongoing exertional chest discomfort despite maximal medical management and if he has evidence of inferior ischemia on a perfusion study then repeat PCI of the native right coronary artery could be considered. However, given the patient's track record the right coronary artery is not likely to remain open even if PCI could be accomplished."  I did send him to see Dr. [name] for evaluation of ongoing leg pain.  He was to be scheduled for an angiogram.  However, he has not had this because of financial issues.  He was added to my schedule today because of chest pain.   However, he is big complaint is 10 out of 10 headache.  He says this has been coming and going for days. Apparently related to his myelofibrosis for which he has been getting TP.  However, he says is not getting any better. Is having some visual disturbances. He's not had any motor or speech disturbances. His dry take Tylenol for relief. Of note he did have chest pain last Friday but he was unlike any of his previous angina. He drank some cold water and he did go away. He's actually not had any new chest pressure, neck or arm discomfort. He's not had any new palpitations, recently or syncope.    No Known Allergies  Current Outpatient Prescriptions  Medication Sig Dispense Refill  . aspirin 81 MG tablet Take 81 mg by mouth daily.      Tyler Whitehead atorvastatin (LIPITOR) 40 MG tablet Take 1.5 tablets (60 mg total) by mouth daily.  135 tablet  3  . carvedilol (COREG) 6.25 MG tablet Take 6.25 mg by mouth 2 (two) times daily with a meal.      . clopidogrel (PLAVIX) 75 MG tablet Take 75 mg by mouth daily with breakfast.      . hydroxyurea (HYDREA) 500 MG capsule Take 1  capsule (500 mg total) by mouth 2 (two) times daily.  60 capsule  3  . Insulin Glargine (LANTUS SOLOSTAR) 100 UNIT/ML Solostar Pen Inject 10 Units into the skin 2 (two) times daily.      . isosorbide mononitrate (IMDUR) 30 MG 24 hr tablet Take 30 mg by mouth daily.      . Linagliptin-Metformin HCl 2.5-500 MG TABS Take 1 tablet by mouth 2 (two) times daily.      Tyler Whitehead lisinopril (PRINIVIL,ZESTRIL) 20 MG tablet Take 20 mg by mouth daily.      Tyler Whitehead NITROSTAT 0.4 MG SL tablet Place 0.4 mg under the tongue every 5 (five) minutes as needed for chest pain.       Tyler Whitehead PROAIR HFA 108 (90 BASE) MCG/ACT inhaler Inhale 1 puff into the lungs every 4 (four) hours as needed.        No current facility-administered medications for this visit.    Past Medical History  Diagnosis Date  . Primary myelofibrosis   . Diverticular disease   . Hemorrhoid   . CAD (coronary artery disease)     Cath [place], right coronary artery occluded, but may occluded, patent radial artery to the PDA, patent LIMA to the LAD. The LAD had 75-85% stenosis which was treated with stenting. There was residual 75%  apical disease. Vein graft to marginal branch patent with a stent.  . Peripheral vascular disease   . DM (diabetes mellitus)   . Hyperlipidemia   . HTN (hypertension)     Past Surgical History  Procedure Laterality Date  . Coronary artery bypass graft  2000    LM occluded, LIMA to LAD patent but distal native LAD disease, saphenous vein bypass graft to OM with 95% stenosis in the midportion, right coronary artery occluded, radial artery graft to right coronary artery patent but was diffusely small. Marginal graft stented. 7 2014. Of note he had multiple previous stents in the right coronary artery. The distal LAD was treated with angioplasty. I  . Cardiac catheterization  May 2014  . Percutaneous coronary stent intervention (pci-s)  April 27 2013  . Vein surgery      ROS:  As stated in the HPI and negative for all other  systems.  PHYSICAL EXAM BP 132/60  Pulse 81  Ht 5\' 7"  (1.702 m)  Wt 191 lb 4.8 oz (86.773 kg)  BMI 29.95 kg/m2 GENERAL:  Well appearing HEENT:  Pupils equal round and reactive, fundi not visualized, oral mucosa unremarkable NECK:  No jugular venous distention, waveform within normal limits, carotid upstroke brisk and symmetric, no bruits, no thyromegaly LYMPHATICS:  No cervical, inguinal adenopathy LUNGS:  Clear to auscultation bilaterally BACK:  No CVA tenderness CHEST:  Well healed sternotomy scar. HEART:  PMI not displaced or sustained,S1 and S2 within normal limits, no S3, no S4, no clicks, no rubs, no murmurs ABD:  Flat, positive bowel sounds normal in frequency in pitch, no bruits, no rebound, no guarding, no midline pulsatile mass, no hepatomegaly, no splenomegaly EXT:  2 plus pulses upper, absent left radial, absent femoral, DP/PT, no edema, no cyanosis no clubbing SKIN:  No rashes no nodules   EKG:  Normal sinus rhythm, rate 81, right bundle branch block, no acute ST-T wave changes. 06/30/2014   ASSESSMENT AND PLAN  CAD:  He has no active chest pain.  He will continue with risk reduction.  At this point he can 10 used any medical management but no other cardiac imaging.  HTN:  The blood pressure is at target. No change in medications is indicated. We will continue with therapeutic lifestyle changes (TLC).  DM:   Per [name], MD  .  A1C was 8.1  PVD:  He is encouraged to eventually get the angiogram it was suggested.  However he wants to put this on hold for now.  HYPERLIPIDEMIA:  His LDL was 86.  I increased Lipitor to 60 mg previously.  I will check a lipid at his follow up with me.    HEADACHE:  This is obviously severe.  He has a nonfocal neuro exam.  However, I have suggested that he go to the ED for imaging and pain management and he will proceed there now.

## 2014-06-30 NOTE — Telephone Encounter (Signed)
Per staff message and POF I have scheduled appts. Advised scheduler of appts. JMW  

## 2014-06-30 NOTE — Discharge Instructions (Signed)

## 2014-06-30 NOTE — ED Notes (Addendum)
Pt states that she has had a headache that has been ongoing for 4-5 days. No neuro deficits.  Pt states that pain and pressure is increasing. Pt was seen by his cancer and heart doctor. Pt states that he had a pint of blood taken out yesterday due to his RBC being increased. Reports nausea as well.

## 2014-06-30 NOTE — ED Notes (Signed)
Pt monitored by pulse ox, bp cuff, and 5-lead. 

## 2014-06-30 NOTE — Telephone Encounter (Signed)
I called [employer] and spoke with Stacy.  She wanted to make sure that Mr Henricks can be seen this week.  I reassured her that we will work him in tomorrow with Dr [name].    I called and left a message for Mr Iyengar concerning the appt date and time.

## 2014-07-01 ENCOUNTER — Telehealth: Payer: Self-pay | Admitting: *Deleted

## 2014-07-01 NOTE — Telephone Encounter (Signed)
Received call from patient stating,"I went to the ER yesterday because I had a headache and I'm still seeing lights. Do I need another phlebotomy before next week?" Per Dr. Lona Kettle, schedule a phlebotomy for tomorrow. Patient is aware of date and time of appointment for tomorrow, 07/02/14 at 10:45 am.

## 2014-07-01 NOTE — Telephone Encounter (Signed)
Patient was seen by Dr Percival Spanish yesterday.

## 2014-07-02 ENCOUNTER — Ambulatory Visit (HOSPITAL_BASED_OUTPATIENT_CLINIC_OR_DEPARTMENT_OTHER): Payer: Medicare Other

## 2014-07-02 VITALS — BP 173/83 | HR 83 | Temp 98.4°F | Resp 18

## 2014-07-02 DIAGNOSIS — D45 Polycythemia vera: Secondary | ICD-10-CM

## 2014-07-02 MED ORDER — SODIUM CHLORIDE 0.9 % IV SOLN
500.0000 mL | Freq: Once | INTRAVENOUS | Status: AC
  Administered 2014-07-02: 500 mL via INTRAVENOUS

## 2014-07-02 MED ORDER — ONDANSETRON 8 MG/NS 50 ML IVPB
INTRAVENOUS | Status: AC
  Filled 2014-07-02: qty 8

## 2014-07-02 MED ORDER — LORAZEPAM 2 MG/ML IJ SOLN: 0.5000 mg | Freq: Once | INTRAMUSCULAR | Status: DC

## 2014-07-02 MED ORDER — ONDANSETRON 8 MG/50ML IVPB (CHCC)
8.0000 mg | Freq: Once | INTRAVENOUS | Status: AC
  Administered 2014-07-02: 8 mg via INTRAVENOUS

## 2014-07-02 NOTE — Progress Notes (Signed)
Phlebotomy procedure performed on patient. 522 grams of blood removed from patient. Patient tolerated well.

## 2014-07-02 NOTE — Patient Instructions (Signed)

## 2014-07-05 ENCOUNTER — Encounter (HOSPITAL_COMMUNITY): Payer: Self-pay | Admitting: Emergency Medicine

## 2014-07-05 ENCOUNTER — Emergency Department (HOSPITAL_COMMUNITY)
Admission: EM | Admit: 2014-07-05 | Discharge: 2014-07-05 | Disposition: A | Discharge status: Home / Self Care | Payer: Medicare Other | Attending: Emergency Medicine | Admitting: Emergency Medicine

## 2014-07-05 ENCOUNTER — Other Ambulatory Visit: Payer: Medicare Other

## 2014-07-05 DIAGNOSIS — M542 Cervicalgia: Secondary | ICD-10-CM | POA: Diagnosis not present

## 2014-07-05 DIAGNOSIS — Z8719 Personal history of other diseases of the digestive system: Secondary | ICD-10-CM | POA: Insufficient documentation

## 2014-07-05 DIAGNOSIS — Z79899 Other long term (current) drug therapy: Secondary | ICD-10-CM | POA: Diagnosis not present

## 2014-07-05 DIAGNOSIS — D45 Polycythemia vera: Secondary | ICD-10-CM | POA: Diagnosis not present

## 2014-07-05 DIAGNOSIS — R12 Heartburn: Secondary | ICD-10-CM | POA: Diagnosis not present

## 2014-07-05 DIAGNOSIS — Z9889 Other specified postprocedural states: Secondary | ICD-10-CM | POA: Insufficient documentation

## 2014-07-05 DIAGNOSIS — H53149 Visual discomfort, unspecified: Secondary | ICD-10-CM | POA: Insufficient documentation

## 2014-07-05 DIAGNOSIS — I10 Essential (primary) hypertension: Secondary | ICD-10-CM | POA: Diagnosis not present

## 2014-07-05 DIAGNOSIS — Z951 Presence of aortocoronary bypass graft: Secondary | ICD-10-CM | POA: Diagnosis not present

## 2014-07-05 DIAGNOSIS — R51 Headache: Secondary | ICD-10-CM | POA: Insufficient documentation

## 2014-07-05 DIAGNOSIS — R519 Headache, unspecified: Secondary | ICD-10-CM

## 2014-07-05 DIAGNOSIS — Z7982 Long term (current) use of aspirin: Secondary | ICD-10-CM | POA: Diagnosis not present

## 2014-07-05 DIAGNOSIS — I2510 Atherosclerotic heart disease of native coronary artery without angina pectoris: Secondary | ICD-10-CM | POA: Insufficient documentation

## 2014-07-05 DIAGNOSIS — E119 Type 2 diabetes mellitus without complications: Secondary | ICD-10-CM | POA: Diagnosis not present

## 2014-07-05 DIAGNOSIS — E785 Hyperlipidemia, unspecified: Secondary | ICD-10-CM | POA: Insufficient documentation

## 2014-07-05 LAB — CBC WITH DIFFERENTIAL/PLATELET
Basophils Absolute: 0 10*3/uL (ref 0.0–0.1)
Basophils Relative: 0 % (ref 0–1)
Eosinophils Absolute: 0.2 10*3/uL (ref 0.0–0.7)
Eosinophils Relative: 1 % (ref 0–5)
HCT: 55.5 % — ABNORMAL HIGH (ref 39.0–52.0)
Hemoglobin: 18.1 g/dL — ABNORMAL HIGH (ref 13.0–17.0)
Lymphocytes Relative: 11 % — ABNORMAL LOW (ref 12–46)
Lymphs Abs: 1.3 10*3/uL (ref 0.7–4.0)
MCH: 26.3 pg (ref 26.0–34.0)
MCHC: 32.6 g/dL (ref 30.0–36.0)
MCV: 80.7 fL (ref 78.0–100.0)
Monocytes Absolute: 0.5 10*3/uL (ref 0.1–1.0)
Monocytes Relative: 4 % (ref 3–12)
Neutro Abs: 9.3 10*3/uL — ABNORMAL HIGH (ref 1.7–7.7)
Neutrophils Relative %: 83 % — ABNORMAL HIGH (ref 43–77)
Platelets: 577 10*3/uL — ABNORMAL HIGH (ref 150–400)
RBC: 6.88 MIL/uL — ABNORMAL HIGH (ref 4.22–5.81)
RDW: 19.2 % — ABNORMAL HIGH (ref 11.5–15.5)
WBC: 11.2 10*3/uL — ABNORMAL HIGH (ref 4.0–10.5)

## 2014-07-05 LAB — COMPREHENSIVE METABOLIC PANEL
ALT: 12 U/L (ref 0–53)
AST: 18 U/L (ref 0–37)
Albumin: 3.6 g/dL (ref 3.5–5.2)
Alkaline Phosphatase: 123 U/L — ABNORMAL HIGH (ref 39–117)
Anion gap: 14 (ref 5–15)
BUN: 11 mg/dL (ref 6–23)
CO2: 24 mEq/L (ref 19–32)
Calcium: 9.7 mg/dL (ref 8.4–10.5)
Chloride: 96 mEq/L (ref 96–112)
Creatinine, Ser: 0.87 mg/dL (ref 0.50–1.35)
GFR calc Af Amer: >90 mL/min (ref >90)
GFR calc non Af Amer: 85 mL/min — ABNORMAL LOW (ref >90)
Glucose, Bld: 303 mg/dL — ABNORMAL HIGH (ref 70–99)
Potassium: 4.7 mEq/L (ref 3.7–5.3)
Sodium: 134 mEq/L — ABNORMAL LOW (ref 137–147)
Total Bilirubin: 0.9 mg/dL (ref 0.3–1.2)
Total Protein: 6.4 g/dL (ref 6.0–8.3)

## 2014-07-05 LAB — C-REACTIVE PROTEIN: CRP: <0.5 mg/dL — ABNORMAL LOW (ref <0.60)

## 2014-07-05 LAB — SEDIMENTATION RATE: Sed Rate: 1 mm/hr (ref 0–16)

## 2014-07-05 MED ORDER — METOCLOPRAMIDE HCL 5 MG/ML IJ SOLN
10.0000 mg | Freq: Once | INTRAMUSCULAR | Status: AC
  Administered 2014-07-05: 10 mg via INTRAVENOUS
  Filled 2014-07-05: qty 2

## 2014-07-05 MED ORDER — HYDROMORPHONE HCL 1 MG/ML IJ SOLN
0.5000 mg | Freq: Once | INTRAMUSCULAR | Status: AC
  Administered 2014-07-05: 0.5 mg via INTRAVENOUS
  Filled 2014-07-05: qty 1

## 2014-07-05 MED ORDER — SODIUM CHLORIDE 0.9 % IV BOLUS (SEPSIS)
1000.0000 mL | Freq: Once | INTRAVENOUS | Status: AC
  Administered 2014-07-05: 1000 mL via INTRAVENOUS

## 2014-07-05 MED ORDER — LORAZEPAM 2 MG/ML IJ SOLN
0.5000 mg | Freq: Once | INTRAMUSCULAR | Status: AC
  Administered 2014-07-05: 0.5 mg via INTRAVENOUS
  Filled 2014-07-05: qty 1

## 2014-07-05 MED ORDER — MORPHINE SULFATE 4 MG/ML IJ SOLN
4.0000 mg | Freq: Once | INTRAMUSCULAR | Status: AC
  Administered 2014-07-05: 4 mg via INTRAVENOUS
  Filled 2014-07-05: qty 1

## 2014-07-05 MED ORDER — PREDNISONE 50 MG PO TABS: ORAL_TABLET | ORAL | Status: DC

## 2014-07-05 NOTE — ED Notes (Signed)
Neurology at bedside.

## 2014-07-05 NOTE — ED Notes (Signed)
Lab called stated they do not have enough of blood for sedimentation rate. Reorder lab test.

## 2014-07-05 NOTE — Consult Note (Signed)
NEURO HOSPITALIST CONSULT NOTE    Reason for Consult: 1 week HA  HPI:                                                                                                                                          Tyler Whitehead is an 73 y.o. male presenting to the ED with a HA he has had for one week. The HA started gradually. Initially it was located over his left forehead and temporal region but then gradually involved his left eye and posterior neck region. He had been seen in ED on 06-30-2014 for HA and associated flashing lights in his left eye, at that time CT head was negative for bleed or stroke.  He received Narcotics which relieved his HA. Patietn returns today due to continued HA. He describes his HA as a sensation "of his skin being torn of his scalp", located over the left forehead and temple and involves his left eye only.  He has eye pain when he moves his left eye and states he has flashing lights and different colors only in his left eye".  He denies any pain when moving his neck, scalp tenderness, jaw claudication. HA has been persistent for > 1 week.  Currently he has received Dilauded, Ativan, Reglan and morphine and has no HA.   Past Medical History  Diagnosis Date  . Primary myelofibrosis   . Diverticular disease   . Hemorrhoid   . CAD (coronary artery disease)     Cath [place], right coronary artery occluded, but may occluded, patent radial artery to the PDA, patent LIMA to the LAD. The LAD had 75-85% stenosis which was treated with stenting. There was residual 75% apical disease. Vein graft to marginal branch patent with a stent.  . Peripheral vascular disease   . DM (diabetes mellitus)   . Hyperlipidemia   . HTN (hypertension)     Past Surgical History  Procedure Laterality Date  . Coronary artery bypass graft  2000    LM occluded, LIMA to LAD patent but distal native LAD disease, saphenous vein bypass graft to OM with 95% stenosis in the  midportion, right coronary artery occluded, radial artery graft to right coronary artery patent but was diffusely small. Marginal graft stented. 7 2014. Of note he had multiple previous stents in the right coronary artery. The distal LAD was treated with angioplasty. I  . Cardiac catheterization  May 2014  . Percutaneous coronary stent intervention (pci-s)  April 27 2013  . Vein surgery      Family History  Problem Relation Age of Onset  . Hyperlipidemia Maternal Grandmother   . Asthma Father   . CAD      Multiple siblings     Social History:  reports  that he has never smoked. He has never used smokeless tobacco. He reports that he does not drink alcohol. His drug history is not on file.  No Known Allergies  MEDICATIONS:                                                                                                                     No current facility-administered medications for this encounter.   Current Outpatient Prescriptions  Medication Sig Dispense Refill  . aspirin 81 MG tablet Take 81 mg by mouth daily.      Marland Kitchen atorvastatin (LIPITOR) 40 MG tablet Take 1.5 tablets (60 mg total) by mouth daily.  135 tablet  3  . carvedilol (COREG) 6.25 MG tablet Take 6.25 mg by mouth 2 (two) times daily with a meal.      . cholecalciferol (VITAMIN D) 1000 UNITS tablet Take 1,000 Units by mouth daily.      . clopidogrel (PLAVIX) 75 MG tablet Take 75 mg by mouth daily with breakfast.      . diphenhydramine-acetaminophen (TYLENOL PM) 25-500 MG TABS Take 1 tablet by mouth at bedtime as needed (sleep/pain).      . fluticasone (FLONASE) 50 MCG/ACT nasal spray Place 1 spray into both nostrils daily.      Marland Kitchen HYDROcodone-acetaminophen (NORCO/VICODIN) 5-325 MG per tablet Take 1 tablet by mouth every 6 (six) hours as needed for moderate pain.  20 tablet  0  . hydroxyurea (HYDREA) 500 MG capsule Take 1 capsule (500 mg total) by mouth 2 (two) times daily.  60 capsule  3  . ibuprofen (ADVIL,MOTRIN) 200 MG  tablet Take 400 mg by mouth every 6 (six) hours as needed for headache.      . Insulin Glargine (LANTUS SOLOSTAR) 100 UNIT/ML Solostar Pen Inject 10 Units into the skin 2 (two) times daily.      . isosorbide mononitrate (IMDUR) 30 MG 24 hr tablet Take 30 mg by mouth daily.      . Linagliptin-Metformin HCl 2.5-500 MG TABS Take 1 tablet by mouth 2 (two) times daily.      Marland Kitchen lisinopril (PRINIVIL,ZESTRIL) 20 MG tablet Take 20 mg by mouth daily.      Marland Kitchen PROAIR HFA 108 (90 BASE) MCG/ACT inhaler Inhale 1 puff into the lungs every 4 (four) hours as needed for wheezing or shortness of breath.       Marland Kitchen NITROSTAT 0.4 MG SL tablet Place 0.4 mg under the tongue every 5 (five) minutes as needed for chest pain.       . predniSONE (DELTASONE) 50 MG tablet Take one tab for 4 days.  4 tablet  0      ROS:  History obtained from the patient  General ROS: negative for - chills, fatigue, fever, night sweats, weight gain or weight loss Psychological ROS: negative for - behavioral disorder, hallucinations, memory difficulties, mood swings or suicidal ideation Ophthalmic ROS: negative for - blurry vision, double vision, eye pain or loss of vision ENT ROS: negative for - epistaxis, nasal discharge, oral lesions, sore throat, tinnitus or vertigo Allergy and Immunology ROS: negative for - hives or itchy/watery eyes Hematological and Lymphatic ROS: negative for - bleeding problems, bruising or swollen lymph nodes Endocrine ROS: negative for - galactorrhea, hair pattern changes, polydipsia/polyuria or temperature intolerance Respiratory ROS: negative for - cough, hemoptysis, shortness of breath or wheezing Cardiovascular ROS: negative for - chest pain, dyspnea on exertion, edema or irregular heartbeat Gastrointestinal ROS: negative for - abdominal pain, diarrhea, hematemesis, nausea/vomiting or  stool incontinence Genito-Urinary ROS: negative for - dysuria, hematuria, incontinence or urinary frequency/urgency Musculoskeletal ROS: negative for - joint swelling or muscular weakness Neurological ROS: as noted in HPI Dermatological ROS: negative for rash and skin lesion changes   Blood pressure 153/77, pulse 85, temperature 97.4 F (36.3 C), temperature source Oral, resp. rate 19, height 5\' 7"  (1.702 m), weight 81.647 kg (180 lb), SpO2 97.00%.   Neurologic Examination:                                                                                                      General: Mental Status: Alert, oriented, thought content appropriate.  Speech fluent without evidence of aphasia.  Able to follow 3 step commands without difficulty. Cranial Nerves: II: Discs flat bilaterally; Visual fields grossly normal, pupils equal, round, reactive to light and accommodation III,IV, VI: ptosis not present, extra-ocular motions intact bilaterally V,VII: smile symmetric, facial light touch sensation normal bilaterally VIII: hearing normal bilaterally IX,X: gag reflex present XI: bilateral shoulder shrug XII: midline tongue extension without atrophy or fasciculations  Motor: Right : Upper extremity   5/5    Left:     Upper extremity   5/5  Lower extremity   5/5     Lower extremity   5/5 Tone and bulk:normal tone throughout; no atrophy noted Sensory: Pinprick and light touch intact throughout, bilaterally Deep Tendon Reflexes:  Right: Upper Extremity   Left: Upper extremity   biceps (C-5 to C-6) 2/4   biceps (C-5 to C-6) 2/4 tricep (C7) 2/4    triceps (C7) 2/4 Brachioradialis (C6) 2/4  Brachioradialis (C6) 2/4  Lower Extremity Lower Extremity  quadriceps (L-2 to L-4) 2/4   quadriceps (L-2 to L-4) 2/4 Achilles (S1) 2/4   Achilles (S1) 2/4  Plantars: Right: downgoing   Left: downgoing Cerebellar: normal finger-to-nose,  normal heel-to-shin test Gait: not tested CV: pulses palpable  throughout    Lab Results: Basic Metabolic Panel:  Recent Labs Lab 06/30/14 1346 07/05/14 0857  NA 135* 134*  K 4.9 4.7  CL 99 96  CO2 26 24  GLUCOSE 430* 303*  BUN 13 11  CREATININE 0.94 0.87  CALCIUM 9.7 9.7    Liver Function Tests:  Recent Labs Lab 07/05/14 0857  AST 18  ALT 12  ALKPHOS 123*  BILITOT 0.9  PROT 6.4  ALBUMIN 3.6   No results found for this basename: LIPASE, AMYLASE,  in the last 168 hours No results found for this basename: AMMONIA,  in the last 168 hours  CBC:  Recent Labs Lab 06/30/14 1346 07/05/14 0857  WBC 16.6* 11.2*  NEUTROABS 14.5* 9.3*  HGB 18.1* 18.1*  HCT 55.3* 55.5*  MCV 79.1 80.7  PLT 615* 577*    Cardiac Enzymes: No results found for this basename: CKTOTAL, CKMB, CKMBINDEX, TROPONINI,  in the last 168 hours  Lipid Panel: No results found for this basename: CHOL, TRIG, HDL, CHOLHDL, VLDL, LDLCALC,  in the last 168 hours  CBG: No results found for this basename: GLUCAP,  in the last 168 hours  Microbiology: No results found for this or any previous visit.  Coagulation Studies: No results found for this basename: LABPROT, INR,  in the last 72 hours  Imaging: No results found.   Etta Quill PA-C [facility] [phone]  07/05/2014, 2:10 PM  Patient seen and examined.  Clinical course and management discussed.  Necessary edits performed.  I agree with the above.  Assessment and plan of care developed and discussed below.    Assessment/Plan: 73 YO male presenting with a HA.  Had pervious headache about one week ago that was aborted in the ED.  Headache has returned but again has been aborted along with medications in the ED along with associated neurological symptoms.  Neurological examination is nonfocal at this time.  Head CT from the 06/30/14 was unremarkable.  Patient on Plavix and ASA.  Would start steroids for rebound.    Recommendations: 1.  Agree with continued intermittent phlebotomy.   2.   Solumedrol 60mg  IV now.  Would start on a steroid taper outpatient    Alexis Goodell, MD [facility] [phone]  07/05/2014  2:39 PM

## 2014-07-05 NOTE — ED Notes (Signed)
Pt sts that he has had a HA x 1 week. sts that the HA has gotten worse and now its affecting his vision. sts recently had a procedure done some sort of blood transfusion.

## 2014-07-05 NOTE — ED Notes (Signed)
Pt placed on the cardiac monitor, BP cuff and pulse ox. Resident at the bedside.

## 2014-07-05 NOTE — ED Provider Notes (Signed)
CSN: [id number]     Arrival date & time 07/05/14  0815 History   First MD Initiated Contact with Patient 07/05/14 [phone]     Chief Complaint  Patient presents with  . Headache     (Consider location/radiation/quality/duration/timing/severity/associated sxs/prior Treatment) Patient is a 73 y.o. male presenting with headaches.  Headache Associated symptoms: nausea, neck pain (left side), photophobia and vomiting   Associated symptoms: no fever, no neck stiffness and no numbness    Pt is a 73 y/o male w/ PMHx of HTN, CAD, DM, and myelofibrosis who presents to the ED w/ HA and blurry vision x 1.5 weeks. He was seen in the ED for the same above reasons on 9/23 and d/c'd home w/ hydrocodone which has not helped w/ HA. CT head was negative at this visit. 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 Per chart review patient is suppose to take hydroxyurea 531m BID w/ possible pt non compliance. Pt states that HA has progressively worsened since his ED visit on the 23rd and vision has worsened to the point he can no longer read. He only sees colored spots in his vision. His HA is located on the left side of his head and is 10/10 in severity. Pt endorses nausea and vomited 2 days ago. Denies chest pain, SOB, weakness, and numbness. He was seen by his ophthalmologist last Saturday w/ a negative exam.      Past Medical History  Diagnosis Date  . Primary myelofibrosis   . Diverticular disease   . Hemorrhoid   . CAD (coronary artery disease)     Cath [place] right coronary artery occluded, but may occluded, patent radial artery to the PDA, patent LIMA to the LAD. The LAD had 75-85% stenosis which was treated with stenting. There was residual 75% apical disease. Vein graft to marginal branch patent with a stent.  . Peripheral vascular disease   . DM (diabetes mellitus)   . Hyperlipidemia   . HTN (hypertension)    Past Surgical History  Procedure  Laterality Date  . Coronary artery bypass graft  2000    LM occluded, LIMA to LAD patent but distal native LAD disease, saphenous vein bypass graft to OM with 95% stenosis in the midportion, right coronary artery occluded, radial artery graft to right coronary artery patent but was diffusely small. Marginal graft stented. 7 2014. Of note he had multiple previous stents in the right coronary artery. The distal LAD was treated with angioplasty. I  . Cardiac catheterization  May 2014  . Percutaneous coronary stent intervention (pci-s)  April 27 2013  . Vein surgery     Family History  Problem Relation Age of Onset  . Hyperlipidemia Maternal Grandmother   . Asthma Father   . CAD      Multiple siblings   History  Substance Use Topics  . Smoking status: Never Smoker   . Smokeless tobacco: Never Used  . Alcohol Use: No    Review of Systems  Constitutional: Positive for appetite change (decreased). Negative for fever.  Eyes: Positive for photophobia and visual disturbance.  Respiratory: Negative for shortness of breath.   Cardiovascular: Negative for chest pain.  Gastrointestinal: Positive for nausea and vomiting.  Endocrine: Positive for heat intolerance.  Musculoskeletal: Positive for neck pain (left side). Negative for neck stiffness.  Neurological: Positive for headaches. Negative for weakness and numbness.      Allergies  Review of patient's allergies indicates  no known allergies.  Home Medications   Prior to Admission medications   Medication Sig Start Date End Date Taking? Authorizing Provider  aspirin 81 MG tablet Take 81 mg by mouth daily.   Yes Historical Provider, MD  atorvastatin (LIPITOR) 40 MG tablet Take 1.5 tablets (60 mg total) by mouth daily. 03/18/14  Yes Minus Breeding, MD  carvedilol (COREG) 6.25 MG tablet Take 6.25 mg by mouth 2 (two) times daily with a meal.   Yes Historical Provider, MD  cholecalciferol (VITAMIN D) 1000 UNITS tablet Take 1,000 Units by mouth  daily.   Yes Historical Provider, MD  clopidogrel (PLAVIX) 75 MG tablet Take 75 mg by mouth daily with breakfast.   Yes Historical Provider, MD  diphenhydramine-acetaminophen (TYLENOL PM) 25-500 MG TABS Take 1 tablet by mouth at bedtime as needed (sleep/pain).   Yes Historical Provider, MD  fluticasone (FLONASE) 50 MCG/ACT nasal spray Place 1 spray into both nostrils daily. 06/30/14  Yes Historical Provider, MD  HYDROcodone-acetaminophen (NORCO/VICODIN) 5-325 MG per tablet Take 1 tablet by mouth every 6 (six) hours as needed for moderate pain. 06/30/14  Yes Evelina Bucy, MD  hydroxyurea (HYDREA) 500 MG capsule Take 1 capsule (500 mg total) by mouth 2 (two) times daily. 03/19/14  Yes Concha Norway, MD  ibuprofen (ADVIL,MOTRIN) 200 MG tablet Take 400 mg by mouth every 6 (six) hours as needed for headache.   Yes Historical Provider, MD  Insulin Glargine (LANTUS SOLOSTAR) 100 UNIT/ML Solostar Pen Inject 10 Units into the skin 2 (two) times daily.   Yes Historical Provider, MD  isosorbide mononitrate (IMDUR) 30 MG 24 hr tablet Take 30 mg by mouth daily.   Yes Historical Provider, MD  Linagliptin-Metformin HCl 2.5-500 MG TABS Take 1 tablet by mouth 2 (two) times daily.   Yes Historical Provider, MD  lisinopril (PRINIVIL,ZESTRIL) 20 MG tablet Take 20 mg by mouth daily.   Yes Historical Provider, MD  PROAIR HFA 108 (90 BASE) MCG/ACT inhaler Inhale 1 puff into the lungs every 4 (four) hours as needed for wheezing or shortness of breath.  12/03/13  Yes Historical Provider, MD  NITROSTAT 0.4 MG SL tablet Place 0.4 mg under the tongue every 5 (five) minutes as needed for chest pain.  10/09/13   Historical Provider, MD   BP 153/77  Pulse 85  Temp(Src) 97.4 F (36.3 C) (Oral)  Resp 19  Ht _0  (1.702 m)  Wt 180 lb (81.647 kg)  BMI 28.19 kg/m2  SpO2 97% Physical Exam  Constitutional: He appears well-developed and well-nourished.  HENT:  Mouth/Throat: Oropharynx is clear and moist.  Tenderness on left side of  head near upper temporal area. Able to read his name on his AVS sheet but cannot read anything else on sheet   Eyes: Pupils are equal, round, and reactive to light.  Neck: Neck supple.  Cardiovascular:  Irregular rate and rhythm    Pulmonary/Chest: Effort normal and breath sounds normal. He has no wheezes.  Abdominal: Soft. Bowel sounds are normal. There is no tenderness.  Lymphadenopathy:    He has no cervical adenopathy.    ED Course  Procedures (including critical care time) Labs Review Labs Reviewed  CBC WITH DIFFERENTIAL - Abnormal; Notable for the following:    WBC 11.2 (*)    RBC 6.88 (*)    Hemoglobin 18.1 (*)    HCT 55.5 (*)    RDW 19.2 (*)    Platelets 577 (*)    Neutrophils Relative % 83 (*)  Neutro Abs 9.3 (*)    Lymphocytes Relative 11 (*)    All other components within normal limits  COMPREHENSIVE METABOLIC PANEL - Abnormal; Notable for the following:    Sodium 134 (*)    Glucose, Bld 303 (*)    Alkaline Phosphatase 123 (*)    GFR calc non Af Amer 85 (*)    All other components within normal limits  SEDIMENTATION RATE  C-REACTIVE PROTEIN      EKG Interpretation None      MDM   Final diagnoses:  Headache, temporal  Polycythemia vera    Pt presents to the ED w/ HA and blurry vision x 1.5 weeks. Pt was seen in the ED on 9/23 for the same reason w/ a negative CT of head done at that time. No focal neuro deficits, likely pt suffering from PCV symptoms, he is JAK2+. Will get CBC and give morphine for pain.   Neuro consult pending. ESR 1, thus likely not temporal arteritis. Platelets actually improved from last ED visit. Pt no longer having HA or blurry vision.   Neuro saw patient and recommend steroids to prevent rebound HA. Will give prednisone 37m x 4 days. Pt scheduled for therapeutic phlebotomy tomorrow.   DJulious Oka MD 07/05/14 [phone]

## 2014-07-06 ENCOUNTER — Ambulatory Visit (HOSPITAL_BASED_OUTPATIENT_CLINIC_OR_DEPARTMENT_OTHER): Payer: Medicare Other

## 2014-07-06 VITALS — BP 152/83 | HR 86 | Resp 20

## 2014-07-06 DIAGNOSIS — D45 Polycythemia vera: Secondary | ICD-10-CM | POA: Diagnosis not present

## 2014-07-06 DIAGNOSIS — G4489 Other headache syndrome: Secondary | ICD-10-CM

## 2014-07-06 NOTE — Patient Instructions (Signed)

## 2014-07-06 NOTE — Progress Notes (Signed)
Tyler Whitehead presents today for phlebotomy per MD orders. Phlebotomy procedure started at 1530 and ended at 1540. 533mls removed. Patient observed for 30 minutes after procedure without any incident. Patient tolerated procedure well. IV needle removed intact.

## 2014-07-06 NOTE — ED Provider Notes (Signed)
I saw and evaluated the patient, reviewed the resident's note and I agree with the findings and plan.  Pt c/o left temporal/frontal headache for the past 1-2 weeks.  Constant. Dull. States hx rare headache in past however this different, constant, persistent. Was milder at onset, gradual in onset, without abrupt worsening. +left temporal tenderness. Denies sinus drainage or congestion. No eye pain. No amaurosis. States earlier was setting spots/colorful spots. States saw ophthalmologist for same within past week, was told eye, eye pressures, eye exam, normal. No hx temporal arteritis. Denies jaw claudication. No neck pain or stiffness. No fever or chills.   Full, normal rom c spine, no rigidity.  Motor intact bil. sens intact. VA noted. Perrl, eomi, no orbital or periorb cellulitis. Pupils briskly reactive. No corneal clouding. No sinus pressure or tenderness.  Neurology consulted.  Esr returns normal. Other labs noted. Pain completely resolved w rx.  Neurology rec d/c w outpt f/u, no additional ed testing.     Mirna Mires, MD 07/06/14 [phone]

## 2014-07-07 ENCOUNTER — Emergency Department (HOSPITAL_COMMUNITY)
Admission: EM | Admit: 2014-07-07 | Discharge: 2014-07-07 | Disposition: A | Discharge status: Home / Self Care | Payer: Medicare Other | Attending: Emergency Medicine | Admitting: Emergency Medicine

## 2014-07-07 ENCOUNTER — Encounter (HOSPITAL_COMMUNITY): Payer: Self-pay | Admitting: Emergency Medicine

## 2014-07-07 DIAGNOSIS — R51 Headache: Secondary | ICD-10-CM | POA: Insufficient documentation

## 2014-07-07 DIAGNOSIS — R519 Headache, unspecified: Secondary | ICD-10-CM

## 2014-07-07 DIAGNOSIS — E119 Type 2 diabetes mellitus without complications: Secondary | ICD-10-CM | POA: Insufficient documentation

## 2014-07-07 DIAGNOSIS — Z8719 Personal history of other diseases of the digestive system: Secondary | ICD-10-CM | POA: Insufficient documentation

## 2014-07-07 DIAGNOSIS — Z79899 Other long term (current) drug therapy: Secondary | ICD-10-CM | POA: Insufficient documentation

## 2014-07-07 DIAGNOSIS — E785 Hyperlipidemia, unspecified: Secondary | ICD-10-CM | POA: Insufficient documentation

## 2014-07-07 DIAGNOSIS — Z85830 Personal history of malignant neoplasm of bone: Secondary | ICD-10-CM | POA: Insufficient documentation

## 2014-07-07 DIAGNOSIS — R112 Nausea with vomiting, unspecified: Secondary | ICD-10-CM | POA: Insufficient documentation

## 2014-07-07 DIAGNOSIS — Z7902 Long term (current) use of antithrombotics/antiplatelets: Secondary | ICD-10-CM | POA: Diagnosis not present

## 2014-07-07 DIAGNOSIS — IMO0002 Reserved for concepts with insufficient information to code with codable children: Secondary | ICD-10-CM | POA: Insufficient documentation

## 2014-07-07 DIAGNOSIS — Z7982 Long term (current) use of aspirin: Secondary | ICD-10-CM | POA: Diagnosis not present

## 2014-07-07 DIAGNOSIS — I2510 Atherosclerotic heart disease of native coronary artery without angina pectoris: Secondary | ICD-10-CM | POA: Insufficient documentation

## 2014-07-07 DIAGNOSIS — Z951 Presence of aortocoronary bypass graft: Secondary | ICD-10-CM | POA: Diagnosis not present

## 2014-07-07 DIAGNOSIS — I10 Essential (primary) hypertension: Secondary | ICD-10-CM | POA: Insufficient documentation

## 2014-07-07 LAB — COMPREHENSIVE METABOLIC PANEL
ALT: 14 U/L (ref 0–53)
AST: 15 U/L (ref 0–37)
Albumin: 3.6 g/dL (ref 3.5–5.2)
Alkaline Phosphatase: 106 U/L (ref 39–117)
Anion gap: 15 (ref 5–15)
BUN: 13 mg/dL (ref 6–23)
CO2: 24 mEq/L (ref 19–32)
Calcium: 9.6 mg/dL (ref 8.4–10.5)
Chloride: 93 mEq/L — ABNORMAL LOW (ref 96–112)
Creatinine, Ser: 0.97 mg/dL (ref 0.50–1.35)
GFR calc Af Amer: >90 mL/min (ref >90)
GFR calc non Af Amer: 81 mL/min — ABNORMAL LOW (ref >90)
Glucose, Bld: 453 mg/dL — ABNORMAL HIGH (ref 70–99)
Potassium: 4.3 mEq/L (ref 3.7–5.3)
Sodium: 132 mEq/L — ABNORMAL LOW (ref 137–147)
Total Bilirubin: 1.3 mg/dL — ABNORMAL HIGH (ref 0.3–1.2)
Total Protein: 6.5 g/dL (ref 6.0–8.3)

## 2014-07-07 LAB — CBC WITH DIFFERENTIAL/PLATELET
Basophils Absolute: 0.1 10*3/uL (ref 0.0–0.1)
Basophils Relative: 0 % (ref 0–1)
Eosinophils Absolute: 0.1 10*3/uL (ref 0.0–0.7)
Eosinophils Relative: 1 % (ref 0–5)
HCT: 53.6 % — ABNORMAL HIGH (ref 39.0–52.0)
Hemoglobin: 17.6 g/dL — ABNORMAL HIGH (ref 13.0–17.0)
Lymphocytes Relative: 9 % — ABNORMAL LOW (ref 12–46)
Lymphs Abs: 1.2 10*3/uL (ref 0.7–4.0)
MCH: 26.4 pg (ref 26.0–34.0)
MCHC: 32.8 g/dL (ref 30.0–36.0)
MCV: 80.4 fL (ref 78.0–100.0)
Monocytes Absolute: 0.5 10*3/uL (ref 0.1–1.0)
Monocytes Relative: 4 % (ref 3–12)
Neutro Abs: 10.6 10*3/uL — ABNORMAL HIGH (ref 1.7–7.7)
Neutrophils Relative %: 85 % — ABNORMAL HIGH (ref 43–77)
Platelets: 433 10*3/uL — ABNORMAL HIGH (ref 150–400)
RBC: 6.67 MIL/uL — ABNORMAL HIGH (ref 4.22–5.81)
RDW: 19.2 % — ABNORMAL HIGH (ref 11.5–15.5)
WBC: 12.5 10*3/uL — ABNORMAL HIGH (ref 4.0–10.5)

## 2014-07-07 LAB — I-STAT TROPONIN, ED: Troponin i, poc: 0 ng/mL (ref 0.00–0.08)

## 2014-07-07 MED ORDER — METOCLOPRAMIDE HCL 5 MG/ML IJ SOLN
10.0000 mg | Freq: Once | INTRAMUSCULAR | Status: AC
  Administered 2014-07-07: 10 mg via INTRAVENOUS
  Filled 2014-07-07: qty 2

## 2014-07-07 MED ORDER — ONDANSETRON 4 MG PO TBDP
8.0000 mg | ORAL_TABLET | Freq: Once | ORAL | Status: AC
  Administered 2014-07-07: 8 mg via ORAL
  Filled 2014-07-07: qty 2

## 2014-07-07 MED ORDER — METOCLOPRAMIDE HCL 10 MG PO TABS: 10.0000 mg | ORAL_TABLET | Freq: Three times a day (TID) | ORAL | Status: DC | PRN

## 2014-07-07 MED ORDER — IBUPROFEN 800 MG PO TABS: 800.0000 mg | ORAL_TABLET | Freq: Three times a day (TID) | ORAL | Status: DC

## 2014-07-07 MED ORDER — SODIUM CHLORIDE 0.9 % IV BOLUS (SEPSIS)
1000.0000 mL | Freq: Once | INTRAVENOUS | Status: AC
  Administered 2014-07-07: 1000 mL via INTRAVENOUS

## 2014-07-07 MED ORDER — KETOROLAC TROMETHAMINE 30 MG/ML IJ SOLN
30.0000 mg | Freq: Once | INTRAMUSCULAR | Status: AC
  Administered 2014-07-07: 30 mg via INTRAVENOUS
  Filled 2014-07-07: qty 1

## 2014-07-07 MED ORDER — DIPHENHYDRAMINE HCL 50 MG/ML IJ SOLN
25.0000 mg | Freq: Once | INTRAMUSCULAR | Status: AC
  Administered 2014-07-07: 25 mg via INTRAVENOUS
  Filled 2014-07-07: qty 1

## 2014-07-07 NOTE — ED Notes (Addendum)
Pt c/o HA x 2 weeks.  Located across forehead and down left side of face and down neck.  Pain described as sharp pain and shooting down neck. Pt reports pain has been getting progressively worse.  Pt reports n/v, last emesis this afternoon.  Mild distress noted, respirations equal and unlabored, skin warm and dry.

## 2014-07-07 NOTE — ED Provider Notes (Signed)
CSN: [id number]     Arrival date & time 07/07/14  1514 History   First MD Initiated Contact with Patient 07/07/14 1905     Chief Complaint  Patient presents with  . Headache  . Emesis     (Consider location/radiation/quality/duration/timing/severity/associated sxs/prior Treatment) Patient is a 73 y.o. male presenting with headaches and vomiting.  Headache Pain location:  Frontal and L temporal Quality:  Sharp Radiates to:  Does not radiate Severity currently:  10/10 Severity at highest:  10/10 Onset quality:  Gradual Duration:  10 days Timing:  Intermittent Progression:  Unchanged Chronicity:  New Similar to prior headaches: yes   Context: not exposure to bright light   Relieved by:  Prescription medications Worsened by:  Nothing tried Ineffective treatments:  None tried Associated symptoms: blurred vision, dizziness, nausea and vomiting   Associated symptoms: no abdominal pain, no congestion, no diarrhea, no fever, no focal weakness, no myalgias, no neck pain, no photophobia, no sore throat and no weakness   Risk factors: does not have insomnia   Emesis Associated symptoms: headaches   Associated symptoms: no abdominal pain, no arthralgias, no chills, no diarrhea, no myalgias and no sore throat     Past Medical History  Diagnosis Date  . Primary myelofibrosis   . Diverticular disease   . Hemorrhoid   . CAD (coronary artery disease)     Cath [place], right coronary artery occluded, but may occluded, patent radial artery to the PDA, patent LIMA to the LAD. The LAD had 75-85% stenosis which was treated with stenting. There was residual 75% apical disease. Vein graft to marginal branch patent with a stent.  . Peripheral vascular disease   . DM (diabetes mellitus)   . Hyperlipidemia   . HTN (hypertension)    Past Surgical History  Procedure Laterality Date  . Coronary artery bypass graft  2000    LM occluded, LIMA to LAD patent but distal native LAD disease,  saphenous vein bypass graft to OM with 95% stenosis in the midportion, right coronary artery occluded, radial artery graft to right coronary artery patent but was diffusely small. Marginal graft stented. 7 2014. Of note he had multiple previous stents in the right coronary artery. The distal LAD was treated with angioplasty. I  . Cardiac catheterization  May 2014  . Percutaneous coronary stent intervention (pci-s)  April 27 2013  . Vein surgery     Family History  Problem Relation Age of Onset  . Hyperlipidemia Maternal Grandmother   . Asthma Father   . CAD      Multiple siblings   History  Substance Use Topics  . Smoking status: Never Smoker   . Smokeless tobacco: Never Used  . Alcohol Use: No    Review of Systems  Constitutional: Negative for fever and chills.  HENT: Negative for congestion and sore throat.   Eyes: Positive for blurred vision and visual disturbance. Negative for photophobia and redness.  Respiratory: Negative for shortness of breath and wheezing.   Cardiovascular: Negative for chest pain.  Gastrointestinal: Positive for nausea and vomiting. Negative for abdominal pain, diarrhea and constipation.  Genitourinary: Negative for dysuria and difficulty urinating.  Musculoskeletal: Negative for arthralgias, myalgias and neck pain.  Skin: Negative for wound.  Neurological: Positive for dizziness and headaches. Negative for focal weakness and syncope.  Psychiatric/Behavioral: Negative for behavioral problems.  All other systems reviewed and are negative.     Allergies  Review of patient's allergies indicates no known allergies.  Home  Medications   Prior to Admission medications   Medication Sig Start Date End Date Taking? Authorizing Provider  aspirin 81 MG tablet Take 81 mg by mouth daily.   Yes Historical Provider, MD  atorvastatin (LIPITOR) 40 MG tablet Take 1.5 tablets (60 mg total) by mouth daily. 03/18/14  Yes Minus Breeding, MD  carvedilol (COREG) 6.25 MG  tablet Take 6.25 mg by mouth 2 (two) times daily with a meal.   Yes Historical Provider, MD  cholecalciferol (VITAMIN D) 1000 UNITS tablet Take 1,000 Units by mouth daily.   Yes Historical Provider, MD  clopidogrel (PLAVIX) 75 MG tablet Take 75 mg by mouth daily with breakfast.   Yes Historical Provider, MD  diphenhydramine-acetaminophen (TYLENOL PM) 25-500 MG TABS Take 1 tablet by mouth at bedtime as needed (sleep/pain).   Yes Historical Provider, MD  fluticasone (FLONASE) 50 MCG/ACT nasal spray Place 1 spray into both nostrils daily. 06/30/14  Yes Historical Provider, MD  hydroxyurea (HYDREA) 500 MG capsule Take 1 capsule (500 mg total) by mouth 2 (two) times daily. 03/19/14  Yes Concha Norway, MD  ibuprofen (ADVIL,MOTRIN) 200 MG tablet Take 400 mg by mouth every 6 (six) hours as needed for headache.   Yes Historical Provider, MD  Insulin Glargine (LANTUS SOLOSTAR) 100 UNIT/ML Solostar Pen Inject 10 Units into the skin 2 (two) times daily.   Yes Historical Provider, MD  isosorbide mononitrate (IMDUR) 30 MG 24 hr tablet Take 30 mg by mouth daily.   Yes Historical Provider, MD  Linagliptin-Metformin HCl 2.5-500 MG TABS Take 1 tablet by mouth 2 (two) times daily.   Yes Historical Provider, MD  lisinopril (PRINIVIL,ZESTRIL) 20 MG tablet Take 20 mg by mouth daily.   Yes Historical Provider, MD  predniSONE (DELTASONE) 50 MG tablet Take 50 mg by mouth daily. Take one tab for 4 days. 07/05/14  Yes Julious Oka, MD  PROAIR HFA 108 [phone] BASE) MCG/ACT inhaler Inhale 1 puff into the lungs every 4 (four) hours as needed for wheezing or shortness of breath.  12/03/13  Yes Historical Provider, MD  ibuprofen (ADVIL,MOTRIN) 800 MG tablet Take 1 tablet (800 mg total) by mouth 3 (three) times daily. 07/07/14   Renne Musca, MD  metoCLOPramide (REGLAN) 10 MG tablet Take 1 tablet (10 mg total) by mouth every 8 (eight) hours as needed for nausea. 07/07/14   Renne Musca, MD  NITROSTAT 0.4 MG SL tablet Place 0.4 mg under the tongue  every 5 (five) minutes as needed for chest pain.  10/09/13   Historical Provider, MD   BP 158/84  Pulse 91  Temp(Src) 97.6 F (36.4 C) (Oral)  Resp 20  Ht '5\' 7"'  (1.702 m)  Wt 188 lb (85.276 kg)  BMI 29.44 kg/m2  SpO2 98% Physical Exam  Vitals reviewed. Constitutional: He is oriented to person, place, and time. He appears well-developed and well-nourished.  HENT:  Head: Normocephalic and atraumatic.  Eyes: EOM are normal. Pupils are equal, round, and reactive to light.  Neck: Normal range of motion.  Cardiovascular: Normal rate, regular rhythm and normal heart sounds.   No murmur heard. Pulmonary/Chest: Effort normal and breath sounds normal. No respiratory distress.  Abdominal: Soft. There is no tenderness.  Musculoskeletal: He exhibits no edema.  Neurological: He is alert and oriented to person, place, and time. He has normal strength and normal reflexes. No cranial nerve deficit or sensory deficit. He displays a negative Romberg sign. Gait normal. GCS eye subscore is 4. GCS verbal subscore is 5. GCS motor  subscore is 6. He displays no Babinski's sign on the right side. He displays no Babinski's sign on the left side.  Skin: No rash noted. He is not diaphoretic.    ED Course  Procedures (including critical care time) Labs Review Labs Reviewed  CBC WITH DIFFERENTIAL - Abnormal; Notable for the following:    WBC 12.5 (*)    RBC 6.67 (*)    Hemoglobin 17.6 (*)    HCT 53.6 (*)    RDW 19.2 (*)    Platelets 433 (*)    Neutrophils Relative % 85 (*)    Neutro Abs 10.6 (*)    Lymphocytes Relative 9 (*)    All other components within normal limits  COMPREHENSIVE METABOLIC PANEL - Abnormal; Notable for the following:    Sodium 132 (*)    Chloride 93 (*)    Glucose, Bld 453 (*)    Total Bilirubin 1.3 (*)    GFR calc non Af Amer 81 (*)    All other components within normal limits  I-STAT TROPOININ, ED    Imaging Review No results found.   EKG Interpretation None      MDM    Final diagnoses:  Headache in front of head    Patient is a 73 year old male with history of PCV that presents with recurrence of headache for 10 days. Patient recently had a negative CT head, negative ESR, seen by neurology and discharged 2 days ago with steroid burst. The patient had phlebotomy yesterday however has not had improvement of symptoms. Patient states that he's been here multiple times and received narcotics and has complete resolution of headache and his Norco is not strong enough. Patient states he has no focal neurologic deficits at this time. Patient says she's had intermittent blurred vision however this appears to be chronic and has been seen by ophthalmology for this. Patient has a appointment with oncology on the sixth. On exam patient has no focal neuro deficits, no tenderness to his left temple, normal visual acuity. We'll give patient had a cocktail at this time.  Patient had complete resolution of headache with the IV medications. Patient is to followup with his hem/onc doctor on the 6th.  Patient advised to use NSAIDs for recurrent headache, return precautions given and patient voiced understanding.    Renne Musca, MD 07/08/14 [phone]

## 2014-07-07 NOTE — ED Notes (Signed)
Pt here for continued headache and vomiting. Has been here several times this week for the same thing. Feels dizzy and having trouble with blurry vision. Speech clear.

## 2014-07-07 NOTE — Discharge Instructions (Signed)

## 2014-07-08 NOTE — ED Provider Notes (Signed)
I saw and evaluated the patient, reviewed the resident's note and I agree with the findings and plan.   EKG Interpretation None      This is a 73 year old male with history of polycythemia who presents with headache. Patient reports ongoing and persistent headache that waxes and wanes over the last several weeks. He has been seen in emergency room twice for the same thing. He states that his headache is classic. He has not taken anything at home. He has not followed up with our patient neurology. He did receive phlebotomy today. He denies any weakness, numbness, vision changes, fevers, neck stiffness. Patient was given a headache cocktail which improved his symptoms. Have low suspicion at this time for intracranial abnormality, subarachnoid hemorrhage, meningitis. Patient was encouraged to followup with outpatient neurology and oncology.  After history, exam, and medical workup I feel the patient has been appropriately medically screened and is safe for discharge home. Pertinent diagnoses were discussed with the patient. Patient was given return precautions.    Merryl Hacker, MD 07/08/14 [phone]

## 2014-07-09 ENCOUNTER — Inpatient Hospital Stay (HOSPITAL_COMMUNITY)
Admission: EM | Admit: 2014-07-09 | Discharge: 2014-07-13 | DRG: 064 | Disposition: A | Discharge status: Home Health | Payer: Medicare Other | Attending: Family Medicine | Admitting: Family Medicine

## 2014-07-09 ENCOUNTER — Emergency Department (HOSPITAL_COMMUNITY): Payer: Medicare Other

## 2014-07-09 ENCOUNTER — Encounter (HOSPITAL_COMMUNITY): Payer: Self-pay | Admitting: Emergency Medicine

## 2014-07-09 DIAGNOSIS — D45 Polycythemia vera: Secondary | ICD-10-CM | POA: Diagnosis present

## 2014-07-09 DIAGNOSIS — E1100 Type 2 diabetes mellitus with hyperosmolarity without nonketotic hyperglycemic-hyperosmolar coma (NKHHC): Secondary | ICD-10-CM | POA: Diagnosis present

## 2014-07-09 DIAGNOSIS — N281 Cyst of kidney, acquired: Secondary | ICD-10-CM

## 2014-07-09 DIAGNOSIS — I63332 Cerebral infarction due to thrombosis of left posterior cerebral artery: Principal | ICD-10-CM | POA: Diagnosis present

## 2014-07-09 DIAGNOSIS — Z825 Family history of asthma and other chronic lower respiratory diseases: Secondary | ICD-10-CM

## 2014-07-09 DIAGNOSIS — R161 Splenomegaly, not elsewhere classified: Secondary | ICD-10-CM

## 2014-07-09 DIAGNOSIS — Z951 Presence of aortocoronary bypass graft: Secondary | ICD-10-CM

## 2014-07-09 DIAGNOSIS — Z7902 Long term (current) use of antithrombotics/antiplatelets: Secondary | ICD-10-CM | POA: Diagnosis not present

## 2014-07-09 DIAGNOSIS — Z794 Long term (current) use of insulin: Secondary | ICD-10-CM | POA: Diagnosis not present

## 2014-07-09 DIAGNOSIS — E1165 Type 2 diabetes mellitus with hyperglycemia: Secondary | ICD-10-CM | POA: Diagnosis present

## 2014-07-09 DIAGNOSIS — Z8249 Family history of ischemic heart disease and other diseases of the circulatory system: Secondary | ICD-10-CM

## 2014-07-09 DIAGNOSIS — E876 Hypokalemia: Secondary | ICD-10-CM | POA: Diagnosis present

## 2014-07-09 DIAGNOSIS — Z7982 Long term (current) use of aspirin: Secondary | ICD-10-CM | POA: Diagnosis not present

## 2014-07-09 DIAGNOSIS — I69351 Hemiplegia and hemiparesis following cerebral infarction affecting right dominant side: Secondary | ICD-10-CM | POA: Diagnosis not present

## 2014-07-09 DIAGNOSIS — I672 Cerebral atherosclerosis: Secondary | ICD-10-CM | POA: Diagnosis present

## 2014-07-09 DIAGNOSIS — H5340 Unspecified visual field defects: Secondary | ICD-10-CM | POA: Diagnosis present

## 2014-07-09 DIAGNOSIS — R569 Unspecified convulsions: Secondary | ICD-10-CM | POA: Diagnosis not present

## 2014-07-09 DIAGNOSIS — D471 Chronic myeloproliferative disease: Secondary | ICD-10-CM | POA: Diagnosis present

## 2014-07-09 DIAGNOSIS — I69398 Other sequelae of cerebral infarction: Secondary | ICD-10-CM | POA: Diagnosis not present

## 2014-07-09 DIAGNOSIS — H53461 Homonymous bilateral field defects, right side: Secondary | ICD-10-CM | POA: Diagnosis present

## 2014-07-09 DIAGNOSIS — R739 Hyperglycemia, unspecified: Secondary | ICD-10-CM

## 2014-07-09 DIAGNOSIS — I739 Peripheral vascular disease, unspecified: Secondary | ICD-10-CM | POA: Diagnosis present

## 2014-07-09 DIAGNOSIS — Z79899 Other long term (current) drug therapy: Secondary | ICD-10-CM | POA: Diagnosis not present

## 2014-07-09 DIAGNOSIS — E785 Hyperlipidemia, unspecified: Secondary | ICD-10-CM | POA: Diagnosis present

## 2014-07-09 DIAGNOSIS — I2510 Atherosclerotic heart disease of native coronary artery without angina pectoris: Secondary | ICD-10-CM | POA: Diagnosis present

## 2014-07-09 DIAGNOSIS — D7581 Myelofibrosis: Secondary | ICD-10-CM

## 2014-07-09 DIAGNOSIS — E119 Type 2 diabetes mellitus without complications: Secondary | ICD-10-CM

## 2014-07-09 DIAGNOSIS — E1101 Type 2 diabetes mellitus with hyperosmolarity with coma: Secondary | ICD-10-CM

## 2014-07-09 DIAGNOSIS — I10 Essential (primary) hypertension: Secondary | ICD-10-CM | POA: Diagnosis present

## 2014-07-09 DIAGNOSIS — I6330 Cerebral infarction due to thrombosis of unspecified cerebral artery: Secondary | ICD-10-CM | POA: Insufficient documentation

## 2014-07-09 HISTORY — DX: Unspecified right bundle-branch block: I45.10

## 2014-07-09 LAB — COMPREHENSIVE METABOLIC PANEL
ALT: 12 U/L (ref 0–53)
AST: 14 U/L (ref 0–37)
Albumin: 3.5 g/dL (ref 3.5–5.2)
Alkaline Phosphatase: 121 U/L — ABNORMAL HIGH (ref 39–117)
Anion gap: 15 (ref 5–15)
BUN: 13 mg/dL (ref 6–23)
CO2: 23 mEq/L (ref 19–32)
Calcium: 9.1 mg/dL (ref 8.4–10.5)
Chloride: 95 mEq/L — ABNORMAL LOW (ref 96–112)
Creatinine, Ser: 0.97 mg/dL (ref 0.50–1.35)
GFR calc Af Amer: >90 mL/min (ref >90)
GFR calc non Af Amer: 81 mL/min — ABNORMAL LOW (ref >90)
Glucose, Bld: 604 mg/dL (ref 70–99)
Potassium: 4.7 mEq/L (ref 3.7–5.3)
Sodium: 133 mEq/L — ABNORMAL LOW (ref 137–147)
Total Bilirubin: 1 mg/dL (ref 0.3–1.2)
Total Protein: 6.6 g/dL (ref 6.0–8.3)

## 2014-07-09 LAB — CBC WITH DIFFERENTIAL/PLATELET
Basophils Absolute: 0 10*3/uL (ref 0.0–0.1)
Basophils Relative: 0 % (ref 0–1)
Eosinophils Absolute: 0 10*3/uL (ref 0.0–0.7)
Eosinophils Relative: 0 % (ref 0–5)
HCT: 49.7 % (ref 39.0–52.0)
Hemoglobin: 16.2 g/dL (ref 13.0–17.0)
Lymphocytes Relative: 4 % — ABNORMAL LOW (ref 12–46)
Lymphs Abs: 0.4 10*3/uL — ABNORMAL LOW (ref 0.7–4.0)
MCH: 26.3 pg (ref 26.0–34.0)
MCHC: 32.6 g/dL (ref 30.0–36.0)
MCV: 80.6 fL (ref 78.0–100.0)
Monocytes Absolute: 0.5 10*3/uL (ref 0.1–1.0)
Monocytes Relative: 4 % (ref 3–12)
Neutro Abs: 11.5 10*3/uL — ABNORMAL HIGH (ref 1.7–7.7)
Neutrophils Relative %: 92 % — ABNORMAL HIGH (ref 43–77)
Platelets: 331 10*3/uL (ref 150–400)
RBC: 6.17 MIL/uL — ABNORMAL HIGH (ref 4.22–5.81)
RDW: 19.5 % — ABNORMAL HIGH (ref 11.5–15.5)
WBC: 12.4 10*3/uL — ABNORMAL HIGH (ref 4.0–10.5)

## 2014-07-09 MED ORDER — SODIUM CHLORIDE 0.9 % IV BOLUS (SEPSIS)
1000.0000 mL | Freq: Once | INTRAVENOUS | Status: AC
  Administered 2014-07-09: 1000 mL via INTRAVENOUS

## 2014-07-09 MED ORDER — FENTANYL CITRATE 0.05 MG/ML IJ SOLN
25.0000 ug | Freq: Once | INTRAMUSCULAR | Status: AC
  Administered 2014-07-09: 25 ug via INTRAVENOUS
  Filled 2014-07-09: qty 2

## 2014-07-09 MED ORDER — SODIUM CHLORIDE 0.9 % IV SOLN
INTRAVENOUS | Status: DC
  Administered 2014-07-09: 5.4 [IU]/h via INTRAVENOUS
  Filled 2014-07-09: qty 2.5

## 2014-07-09 NOTE — ED Provider Notes (Cosign Needed)
CSN: [id number]     Arrival date & time 07/09/14  2011 History   First MD Initiated Contact with Patient 07/09/14 2013     Chief Complaint  Patient presents with  . 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  Wife states patient was eating dinner, stated he "couldn't see" (has had flashing lights in his vision recently), got up from the table and walked around the kitchen, his left face then drooped and his right arm started moving, he then passed out and fell to the ground.  His wife describes shaking throughout his body for unknown amount of time, with blood coming from left side of mouth, when EMS arrived he was awake but confused and speaking incoherently (real words but not making sense).  Son notes patient told him that he (pt) was 4 years old.  Son notes patient had an episode of "incoherent babbling yesterday" and has complained of the vision changes recently as well.  The left sided headaches are thought to be caused by polycythemia vera.    Level V caveat for mental status change.   Past Medical History  Diagnosis Date  . Primary myelofibrosis   . Diverticular disease   . Hemorrhoid   . CAD (coronary artery disease)     Cath [place], right coronary artery occluded, but may occluded, patent radial artery to the PDA, patent LIMA to the LAD. The LAD had 75-85% stenosis which was treated with stenting. There was residual 75% apical disease. Vein graft to marginal branch patent with a stent.  . Peripheral vascular disease   . DM (diabetes mellitus)   . Hyperlipidemia   . HTN (hypertension)    Past Surgical History  Procedure Laterality Date  . Coronary artery bypass graft  2000    LM occluded, LIMA to LAD patent but distal native LAD disease, saphenous vein bypass graft to OM with 95% stenosis in the  midportion, right coronary artery occluded, radial artery graft to right coronary artery patent but was diffusely small. Marginal graft stented. 7 2014. Of note he had multiple previous stents in the right coronary artery. The distal LAD was treated with angioplasty. I  . Cardiac catheterization  May 2014  . Percutaneous coronary stent intervention (pci-s)  April 27 2013  . Vein surgery     Family History  Problem Relation Age of Onset  . Hyperlipidemia Maternal Grandmother   . Asthma Father   . CAD      Multiple siblings   History  Substance Use Topics  . Smoking status: Never Smoker   . Smokeless tobacco: Never Used  . Alcohol Use: No    Review of Systems  Unable to perform ROS: Mental status change      Allergies  Review of patient's allergies indicates no known allergies.  Home Medications   Prior to Admission medications   Medication Sig Start Date End Date Taking? Authorizing Provider  aspirin 81 MG tablet Take 81 mg by mouth daily.    Historical Provider, MD  atorvastatin (LIPITOR) 40 MG tablet Take 1.5 tablets (60 mg total) by mouth daily. 03/18/14   Minus Breeding, MD  carvedilol (COREG) 6.25 MG tablet Take 6.25 mg by mouth 2 (two) times daily with a meal.    Historical Provider, MD  cholecalciferol (VITAMIN D) 1000 UNITS tablet Take 1,000 Units by mouth daily.    Historical Provider,  MD  clopidogrel (PLAVIX) 75 MG tablet Take 75 mg by mouth daily with breakfast.    Historical Provider, MD  diphenhydramine-acetaminophen (TYLENOL PM) 25-500 MG TABS Take 1 tablet by mouth at bedtime as needed (sleep/pain).    Historical Provider, MD  fluticasone (FLONASE) 50 MCG/ACT nasal spray Place 1 spray into both nostrils daily. 06/30/14   Historical Provider, MD  hydroxyurea (HYDREA) 500 MG capsule Take 1 capsule (500 mg total) by mouth 2 (two) times daily. 03/19/14   Concha Norway, MD  ibuprofen (ADVIL,MOTRIN) 200 MG tablet Take 400 mg by mouth every 6 (six) hours as needed for  headache.    Historical Provider, MD  ibuprofen (ADVIL,MOTRIN) 800 MG tablet Take 1 tablet (800 mg total) by mouth 3 (three) times daily. 07/07/14   Renne Musca, MD  Insulin Glargine (LANTUS SOLOSTAR) 100 UNIT/ML Solostar Pen Inject 10 Units into the skin 2 (two) times daily.    Historical Provider, MD  isosorbide mononitrate (IMDUR) 30 MG 24 hr tablet Take 30 mg by mouth daily.    Historical Provider, MD  Linagliptin-Metformin HCl 2.5-500 MG TABS Take 1 tablet by mouth 2 (two) times daily.    Historical Provider, MD  lisinopril (PRINIVIL,ZESTRIL) 20 MG tablet Take 20 mg by mouth daily.    Historical Provider, MD  metoCLOPramide (REGLAN) 10 MG tablet Take 1 tablet (10 mg total) by mouth every 8 (eight) hours as needed for nausea. 07/07/14   Renne Musca, MD  NITROSTAT 0.4 MG SL tablet Place 0.4 mg under the tongue every 5 (five) minutes as needed for chest pain.  10/09/13   Historical Provider, MD  predniSONE (DELTASONE) 50 MG tablet Take 50 mg by mouth daily. Take one tab for 4 days. 07/05/14   Julious Oka, MD  PROAIR HFA 108 (90 BASE) MCG/ACT inhaler Inhale 1 puff into the lungs every 4 (four) hours as needed for wheezing or shortness of breath.  12/03/13   Historical Provider, MD   BP 141/120  Pulse 107  Temp(Src) 98.4 F (36.9 C) (Oral)  Resp 22  Ht 5\' 7"  (1.702 m)  Wt 185 lb (83.915 kg)  BMI 28.97 kg/m2  SpO2 95% Physical Exam  Nursing note and vitals reviewed. Constitutional: He appears well-developed and well-nourished. No distress.  HENT:  Head: Normocephalic.    Mouth/Throat:    Neck: Neck supple.  Cardiovascular: Normal rate and regular rhythm.   Pulmonary/Chest: Effort normal and breath sounds normal. No respiratory distress. He has no wheezes. He has no rales.  Abdominal: Soft. He exhibits no distension. There is no tenderness. There is no rebound and no guarding.  Genitourinary:  Incontinent of urine  Musculoskeletal:  Spine nontender, no crepitus, or stepoffs.     Neurological: He is alert. He has normal strength. No cranial nerve deficit or sensory deficit. He exhibits normal muscle tone. GCS eye subscore is 4. GCS verbal subscore is 5. GCS motor subscore is 6.  Oriented to self only.  Knows he's in a hospital.  Does not know the date, the year, the president, or what happened tonight.  Does not remember eating dinner prior to event.   Skin: He is not diaphoretic.    ED Course  Procedures (including critical care time) Labs Review Labs Reviewed  CBC WITH DIFFERENTIAL - Abnormal; Notable for the following:    WBC 12.4 (*)    RBC 6.17 (*)    RDW 19.5 (*)    Neutrophils Relative % 92 (*)    Neutro Abs 11.5 (*)  Lymphocytes Relative 4 (*)    Lymphs Abs 0.4 (*)    All other components within normal limits  COMPREHENSIVE METABOLIC PANEL - Abnormal; Notable for the following:    Sodium 133 (*)    Chloride 95 (*)    Glucose, Bld 604 (*)    Alkaline Phosphatase 121 (*)    GFR calc non Af Amer 81 (*)    All other components within normal limits  URINALYSIS, ROUTINE W REFLEX MICROSCOPIC    Imaging Review Dg Chest 1 View  07/09/2014   CLINICAL DATA:  Seizures, acute syncope, initial encounter.  EXAM: CHEST - 1 VIEW  COMPARISON:  10/27/2010.  FINDINGS: Trachea is midline. Heart size stable. Lungs are somewhat low in volume with linear scarring at the left lung base. No airspace consolidation. Thickening of the minor fissure. No pleural fluid.  IMPRESSION: No acute findings.   Electronically Signed   By: Lorin Picket M.D.   On: 07/09/2014 21:51   Ct Head Wo Contrast  07/09/2014   CLINICAL DATA:  Severe headache; confusion ; history of standing up after date air in the coming week in falling to the floor with seizure like activity ; the patient was incontinent of urine and agitated after seizure activity ; hyperglycemia at the seen  EXAM: CT HEAD WITHOUT CONTRAST  CT CERVICAL SPINE WITHOUT CONTRAST  TECHNIQUE: Multidetector CT imaging of the head and  cervical spine was performed following the standard protocol without intravenous contrast. Multiplanar CT image reconstructions of the cervical spine were also generated.  COMPARISON:  Noncontrast CT scan of the brain dated Dated June 30, 2014  FINDINGS: CT HEAD FINDINGS  There is mild diffuse cerebral and cerebellar atrophy with compensatory ventriculomegaly. There is no intracranial hemorrhage nor intracranial mass effect. There is no acute ischemic change. The cerebellum and brainstem are unremarkable. The observed paranasal sinuses exhibit no air-fluid levels. There is no acute skull fracture.  CT CERVICAL SPINE FINDINGS  The cervical vertebral bodies are preserved in height. The prevertebral soft tissue spaces are normal. The intervertebral disc space heights are well maintained. There is no perched facet nor spinous process fracture. The odontoid is intact. The visualized portions of the pulmonary apices and the soft tissues of the neck are unremarkable. There is mild prominence of the thyroid isthmus.  IMPRESSION: 1. There is no acute intracranial hemorrhage nor other acute intracranial abnormality. There are mild stable age related atrophic changes. 2. There is no acute skull fracture. 3. There is no acute cervical spine fracture nor dislocation.   Electronically Signed   By: David  Martin[place]   On: 07/09/2014 21:40   Ct Cervical Spine Wo Contrast  07/09/2014   CLINICAL DATA:  Severe headache; confusion ; history of standing up after date air in the coming week in falling to the floor with seizure like activity ; the patient was incontinent of urine and agitated after seizure activity ; hyperglycemia at the seen  EXAM: CT HEAD WITHOUT CONTRAST  CT CERVICAL SPINE WITHOUT CONTRAST  TECHNIQUE: Multidetector CT imaging of the head and cervical spine was performed following the standard protocol without intravenous contrast. Multiplanar CT image reconstructions of the cervical spine were also generated.   COMPARISON:  Noncontrast CT scan of the brain dated Dated June 30, 2014  FINDINGS: CT HEAD FINDINGS  There is mild diffuse cerebral and cerebellar atrophy with compensatory ventriculomegaly. There is no intracranial hemorrhage nor intracranial mass effect. There is no acute ischemic change. The cerebellum and brainstem are  unremarkable. The observed paranasal sinuses exhibit no air-fluid levels. There is no acute skull fracture.  CT CERVICAL SPINE FINDINGS  The cervical vertebral bodies are preserved in height. The prevertebral soft tissue spaces are normal. The intervertebral disc space heights are well maintained. There is no perched facet nor spinous process fracture. The odontoid is intact. The visualized portions of the pulmonary apices and the soft tissues of the neck are unremarkable. There is mild prominence of the thyroid isthmus.  IMPRESSION: 1. There is no acute intracranial hemorrhage nor other acute intracranial abnormality. There are mild stable age related atrophic changes. 2. There is no acute skull fracture. 3. There is no acute cervical spine fracture nor dislocation.   Electronically Signed   By: David  [place]   On: 07/09/2014 21:40     EKG Interpretation None      8:46 PM Patient seen and examined.  I have discussed the patient with Dr Stevie Kern and have asked him to also see the patient.     10:54 PM I spoke with Dr Arnoldo Morale who will admit the patient.  Pending neurology call for consult.    12:48 AM I spoke with Dr Nicole Kindred who will see patient in consult.  (Repaged to new number.  Per Network engineer, paging system has been down tonight)  MDM   Final diagnoses:  New onset seizure  Visual field defect  Hyperglycemia  Polycythemia vera    Pt with hx polycythemia vera with ongoing headache x 10 days, intermittent flashes in his vision and new right sided visual field deficit p/w new onset seizure tonight with convulsions that began in right arm and then generalized, with tongue  biting and urinary incontinence, followed by postictal period, which improved while he was in ED.  C/O severe headache on arrival, oriented only to self.  Found to be hyperglycemic, labs otherwise unremarkable.  Head CT negative.  Admitted to medicine ([facility]), neurology to consult.      [place], PA-C 07/10/14 [phone]

## 2014-07-09 NOTE — ED Notes (Signed)
[id number] 

## 2014-07-09 NOTE — ED Notes (Signed)
STOOD UP AFTER EATING DINNER AND BECAME WEAK FELL TO FLOOR AND HAD SEIZURE LIKE ACTIVITY FOR UNKN LENGTH OF TIME NO HX SZ PT WAS INCONT URINE AND AGITATED AFTER SZ ACTIVITY PER MEDICS GLUCOSE WAS 453 AFTER PT ATE

## 2014-07-09 NOTE — ED Provider Notes (Signed)
Medical screening examination/treatment/procedure(s) were conducted as a shared visit with non-physician practitioner(s) and myself.  I personally evaluated the patient during the encounter.   EKG Interpretation None     Several weeks of headache, onset sometime yesterday of some vague right-sided vision change, today although has blindness to his right side visual field affecting both eyes, last known well greater than 8 hours prior to arrival, tonight had a witnessed episode apparent focal seizure with secondary generalization patient started shaking right arm with facial twitching and then entire body started shaking as well with patient having postictal confusion patient now oriented to person and hospital not to time; patient with normal light touch her arms and legs no pronator drift in arms or legs with normal bilateral finger to nose testing and clear speech with ongoing but improving postictal confusion with hemianopsia with absent vision right visual fields  Babette Relic, MD 07/12/14 1819

## 2014-07-09 NOTE — ED Notes (Signed)
Patient still off the unit for testing. Family present in the room

## 2014-07-09 NOTE — ED Notes (Signed)
Pt in ct 

## 2014-07-09 NOTE — H&P (Addendum)
[facility] Admission History and Physical       Tyler Whitehead DOB: 06/04/1941 DOA: 07/09/2014  Referring physician: EDP PCP: [name], MD  Specialists:   Chief Complaint: Seizure  HPI: Tyler Whitehead is a 73 y.o. male with a history of IDDM, HTN, CAD, Hyperlipidemia, and Polycythemia Vera who presents to the ED following seizure like activity that occurred after he ate dinner this evening.    He has had a severe headache for several days and since the AM he has had decreased peripheral vision on the right.  He was last known to be normal 8 hours earlier.   He denies having any fevers chills, chest pain or SOB.  After dinner this evening, his wife reports that he stood up and since he could not see well she helped him up to the sink to wash his hands, and he turned and began to have shaking of his right arm and then he fell back onto the floor and began to shake all over.    He was unconscious and also lost his urine during the episode.  EMS was called and on the scene his glucose was found to be 453.  In the ED, his glucose was 604.   He was placed on the IV Insulin Glucostabilizer Protocol. He was also sent for a CT scan of the Head and Neck which were both negative for acute findings.    Neurology was consulted by the EDP  Review of Systems:  Constitutional: No Weight Loss, No Weight Gain, Night Sweats, Fevers, Chills, Dizziness, Fatigue, or Generalized Weakness HEENT: +Headaches, Difficulty Swallowing,Tooth/Dental Problems,Sore Throat,  No Sneezing, Rhinitis, Ear Ache, Nasal Congestion, or Post Nasal Drip,  Cardio-vascular:  No Chest pain, Orthopnea, PND, Edema in Lower Extremities, Anasarca, Dizziness, Palpitations  Resp: No Dyspnea, No DOE, No Cough, No Hemoptysis, No Wheezing.    GI: No Heartburn, Indigestion, Abdominal Pain, Nausea, Vomiting, Diarrhea, Hematemesis, Hematochezia, Melena, Change in Bowel Habits,  Loss of Appetite  GU: No Dysuria,  Change in Color of Urine, No Urgency or Frequency, No Flank pain.  Musculoskeletal: No Joint Pain or Swelling, No Decreased Range of Motion, No Back Pain.  Neurologic: No Syncope, +Seizures, Muscle Weakness, Paresthesia, +Vision Disturbance or Loss, No Diplopia, No Vertigo, No Difficulty Walking,  Skin: No Rash or Lesions. Psych: No Change in Mood or Affect, No Depression or Anxiety, No Memory loss, No Confusion, or Hallucinations   Past Medical History  Diagnosis Date  . Primary myelofibrosis   . Diverticular disease   . Hemorrhoid   . CAD (coronary artery disease)     Cath [place], right coronary artery occluded, but may occluded, patent radial artery to the PDA, patent LIMA to the LAD. The LAD had 75-85% stenosis which was treated with stenting. There was residual 75% apical disease. Vein graft to marginal branch patent with a stent.  . Peripheral vascular disease   . DM (diabetes mellitus)   . Hyperlipidemia   . HTN (hypertension)       Past Surgical History  Procedure Laterality Date  . Coronary artery bypass graft  2000    LM occluded, LIMA to LAD patent but distal native LAD disease, saphenous vein bypass graft to OM with 95% stenosis in the midportion, right coronary artery occluded, radial artery graft to right coronary artery patent but was diffusely small. Marginal graft stented. 7 2014. Of note he had multiple previous stents in the right coronary artery. The distal LAD was  treated with angioplasty. I  . Cardiac catheterization  May 2014  . Percutaneous coronary stent intervention (pci-s)  April 27 2013  . Vein surgery         Prior to Admission medications   Medication Sig Start Date End Date Taking? Authorizing Provider  aspirin 81 MG tablet Take 81 mg by mouth daily.   Yes Historical Provider, MD  atorvastatin (LIPITOR) 40 MG tablet Take 1.5 tablets (60 mg total) by mouth daily. 03/18/14  Yes Minus Breeding, MD  carvedilol (COREG) 6.25 MG tablet Take 6.25 mg by  mouth 2 (two) times daily with a meal.   Yes Historical Provider, MD  cholecalciferol (VITAMIN D) 1000 UNITS tablet Take 1,000 Units by mouth daily.   Yes Historical Provider, MD  clopidogrel (PLAVIX) 75 MG tablet Take 75 mg by mouth daily with breakfast.   Yes Historical Provider, MD  diphenhydramine-acetaminophen (TYLENOL PM) 25-500 MG TABS Take 1 tablet by mouth at bedtime as needed (sleep/pain).   Yes Historical Provider, MD  fluticasone (FLONASE) 50 MCG/ACT nasal spray Place 1 spray into both nostrils daily. 06/30/14  Yes Historical Provider, MD  hydroxyurea (HYDREA) 500 MG capsule Take 1 capsule (500 mg total) by mouth 2 (two) times daily. 03/19/14  Yes Concha Norway, MD  ibuprofen (ADVIL,MOTRIN) 200 MG tablet Take 400 mg by mouth every 6 (six) hours as needed for headache.   Yes Historical Provider, MD  Insulin Glargine (LANTUS SOLOSTAR) 100 UNIT/ML Solostar Pen Inject 10 Units into the skin 2 (two) times daily.   Yes Historical Provider, MD  isosorbide mononitrate (IMDUR) 30 MG 24 hr tablet Take 30 mg by mouth daily.   Yes Historical Provider, MD  Linagliptin-Metformin HCl 2.5-500 MG TABS Take 1 tablet by mouth 2 (two) times daily.   Yes Historical Provider, MD  lisinopril (PRINIVIL,ZESTRIL) 20 MG tablet Take 20 mg by mouth daily.   Yes Historical Provider, MD  metoCLOPramide (REGLAN) 10 MG tablet Take 1 tablet (10 mg total) by mouth every 8 (eight) hours as needed for nausea. 07/07/14  Yes Renne Musca, MD  NITROSTAT 0.4 MG SL tablet Place 0.4 mg under the tongue every 5 (five) minutes as needed for chest pain.  10/09/13  Yes Historical Provider, MD  PROAIR HFA 108 (90 BASE) MCG/ACT inhaler Inhale 1 puff into the lungs every 4 (four) hours as needed for wheezing or shortness of breath.  12/03/13  Yes Historical Provider, MD      No Known Allergies   Social History:  reports that he has never smoked. He has never used smokeless tobacco. He reports that he does not drink alcohol. His drug history  is not on file.     Family History  Problem Relation Age of Onset  . Hyperlipidemia Maternal Grandmother   . Asthma Father   . CAD      Multiple siblings       Physical Exam:  GEN:  Pleasant Well Nourished and Well Developed Elderly  73 y.o. African American male examined  and in no acute distress; cooperative with exam Filed Vitals:   07/09/14 2215 07/09/14 2230 07/09/14 2233 07/09/14 2245  BP: 170/93 178/91  180/85  Pulse: 98 98  97  Temp:      TempSrc:      Resp: 22 15  21   Height:   5\' 7"  (1.702 m)   Weight:   81.647 kg (180 lb)   SpO2: 97% 96%  99%   Blood pressure 180/85, pulse 97, temperature 98.4  F (36.9 C), temperature source Oral, resp. rate 21, height 5\' 7"  (1.702 m), weight 81.647 kg (180 lb), SpO2 99.00%. PSYCH: He is alert and oriented x4; does not appear anxious does not appear depressed; affect is normal HEENT: Normocephalic and Atraumatic, Mucous membranes pink; PERRLA; EOM intact; Fundi:  Benign;  No scleral icterus, Nares: Patent, Oropharynx: Clear, Fair Dentition,    Neck:  FROM, No Cervical Lymphadenopathy nor Thyromegaly or Carotid Bruit; No JVD; Breasts:: Not examined CHEST WALL: No tenderness CHEST: Normal respiration, clear to auscultation bilaterally HEART: Regular rate and rhythm; no murmurs rubs or gallops BACK: No kyphosis or scoliosis; No CVA tenderness ABDOMEN: Positive Bowel Sounds, Soft Non-Tender; No Masses, No Organomegaly,  Rectal Exam: Not done EXTREMITIES: No  Cyanosis, Clubbing, or Edema; No Ulcerations. Genitalia: not examined PULSES: 2+ and symmetric SKIN: Normal hydration no rash or ulceration  CNS:   Mental Status:  Alert, Oriented, Thought Content Appropriate. Speech Fluent without evidence of Aphasia. Able to follow 3 step commands without difficulty.  In No obvious pain.   Cranial Nerves:  II: Discs flat bilaterally; Visual fields Intact except for Decreased peripheral vision to the right,  Pupils equal and reactive.     III,IV, VI: Extra-ocular motions intact bilaterally    V,VII: smile symmetric, facial light touch sensation normal bilaterally    VIII: hearing intact bilaterally    IX,X: gag reflex present    XI: bilateral shoulder shrug    XII: midline tongue extension   Motor:  Right:  Upper extremity 5/5     Left:  Upper extremity 5/5     Right:  Lower extremity 5/5    Left:  Lower extremity 5/5     Tone and Bulk:  normal tone throughout; no atrophy noted   Sensory:  Pinprick and light touch intact throughout, bilaterally   Deep Tendon Reflexes: 2+ and symmetric throughout   Plantars/ Babinski:  Right: normal Left: normal    Cerebellar:  Finger to nose  without difficulty.   Gait: deferred   Vascular: pulses palpable throughout    Labs on Admission:  Basic Metabolic Panel:  Recent Labs Lab 07/05/14 0857 07/07/14 1539 07/09/14 2000  NA 134* 132* 133*  K 4.7 4.3 4.7  CL 96 93* 95*  CO2 24 24 23   GLUCOSE 303* 453* 604*  BUN 11 13 13   CREATININE 0.87 0.97 0.97  CALCIUM 9.7 9.6 9.1   Liver Function Tests:  Recent Labs Lab 07/05/14 0857 07/07/14 1539 07/09/14 2000  AST 18 15 14   ALT 12 14 12   ALKPHOS 123* 106 121*  BILITOT 0.9 1.3* 1.0  PROT 6.4 6.5 6.6  ALBUMIN 3.6 3.6 3.5   No results found for this basename: LIPASE, AMYLASE,  in the last 168 hours No results found for this basename: AMMONIA,  in the last 168 hours CBC:  Recent Labs Lab 07/05/14 0857 07/07/14 1539 07/09/14 2000  WBC 11.2* 12.5* 12.4*  NEUTROABS 9.3* 10.6* 11.5*  HGB 18.1* 17.6* 16.2  HCT 55.5* 53.6* 49.7  MCV 80.7 80.4 80.6  PLT 577* 433* 331   Cardiac Enzymes: No results found for this basename: CKTOTAL, CKMB, CKMBINDEX, TROPONINI,  in the last 168 hours  BNP (last 3 results) No results found for this basename: PROBNP,  in the last 8760 hours CBG: No results found for this basename: GLUCAP,  in the last 168 hours  Radiological Exams on Admission: Dg Chest 1 View  07/09/2014    CLINICAL DATA:  Seizures, acute syncope,  initial encounter.  EXAM: CHEST - 1 VIEW  COMPARISON:  10/27/2010.  FINDINGS: Trachea is midline. Heart size stable. Lungs are somewhat low in volume with linear scarring at the left lung base. No airspace consolidation. Thickening of the minor fissure. No pleural fluid.  IMPRESSION: No acute findings.   Electronically Signed   By: Lorin Picket M.D.   On: 07/09/2014 21:51   Ct Head Wo Contrast  07/09/2014   CLINICAL DATA:  Severe headache; confusion ; history of standing up after date air in the coming week in falling to the floor with seizure like activity ; the patient was incontinent of urine and agitated after seizure activity ; hyperglycemia at the seen  EXAM: CT HEAD WITHOUT CONTRAST  CT CERVICAL SPINE WITHOUT CONTRAST  TECHNIQUE: Multidetector CT imaging of the head and cervical spine was performed following the standard protocol without intravenous contrast. Multiplanar CT image reconstructions of the cervical spine were also generated.  COMPARISON:  Noncontrast CT scan of the brain dated Dated June 30, 2014  FINDINGS: CT HEAD FINDINGS  There is mild diffuse cerebral and cerebellar atrophy with compensatory ventriculomegaly. There is no intracranial hemorrhage nor intracranial mass effect. There is no acute ischemic change. The cerebellum and brainstem are unremarkable. The observed paranasal sinuses exhibit no air-fluid levels. There is no acute skull fracture.  CT CERVICAL SPINE FINDINGS  The cervical vertebral bodies are preserved in height. The prevertebral soft tissue spaces are normal. The intervertebral disc space heights are well maintained. There is no perched facet nor spinous process fracture. The odontoid is intact. The visualized portions of the pulmonary apices and the soft tissues of the neck are unremarkable. There is mild prominence of the thyroid isthmus.  IMPRESSION: 1. There is no acute intracranial hemorrhage nor other acute  intracranial abnormality. There are mild stable age related atrophic changes. 2. There is no acute skull fracture. 3. There is no acute cervical spine fracture nor dislocation.   Electronically Signed   By: David  Martinique   On: 07/09/2014 21:40   Ct Cervical Spine Wo Contrast  07/09/2014   CLINICAL DATA:  Severe headache; confusion ; history of standing up after date air in the coming week in falling to the floor with seizure like activity ; the patient was incontinent of urine and agitated after seizure activity ; hyperglycemia at the seen  EXAM: CT HEAD WITHOUT CONTRAST  CT CERVICAL SPINE WITHOUT CONTRAST  TECHNIQUE: Multidetector CT imaging of the head and cervical spine was performed following the standard protocol without intravenous contrast. Multiplanar CT image reconstructions of the cervical spine were also generated.  COMPARISON:  Noncontrast CT scan of the brain dated Dated June 30, 2014  FINDINGS: CT HEAD FINDINGS  There is mild diffuse cerebral and cerebellar atrophy with compensatory ventriculomegaly. There is no intracranial hemorrhage nor intracranial mass effect. There is no acute ischemic change. The cerebellum and brainstem are unremarkable. The observed paranasal sinuses exhibit no air-fluid levels. There is no acute skull fracture.  CT CERVICAL SPINE FINDINGS  The cervical vertebral bodies are preserved in height. The prevertebral soft tissue spaces are normal. The intervertebral disc space heights are well maintained. There is no perched facet nor spinous process fracture. The odontoid is intact. The visualized portions of the pulmonary apices and the soft tissues of the neck are unremarkable. There is mild prominence of the thyroid isthmus.  IMPRESSION: 1. There is no acute intracranial hemorrhage nor other acute intracranial abnormality. There are mild stable age related  atrophic changes. 2. There is no acute skull fracture. 3. There is no acute cervical spine fracture nor dislocation.    Electronically Signed   By: David  Martinique   On: 07/09/2014 21:40     EKG: Independently reviewed.    Assessment/Plan:   73 y.o. male with  Principal Problem:   1.   New onset seizure   MRI.MRA of Brain, and EEG in AM   Neurology consulted   Seizure Precautions   IV Keppra Load and the IV q 12 hrs   PRN IV Ativan   Monitor glucose levels   Active Problems:   2.   Diabetic hyperosmolar non-ketotic state   IV Insulin Drip Protocol   Hold Oral Hypoglycemic Rx for now     3.   Uncontrolled hypertension   IV Hydralazine     4.   Visual Field Defect   MRI/MRA in AM   Neurology Consulted      5.   CAD (coronary artery disease)   Stable   Resume Carvedilol, atorvastatin, Lisinopril ASA and Plavix in AM     6.   Peripheral vascular disease   stable     7.   DM2 (diabetes mellitus, type 2)   Hold Lantus, and Lanagliptin/Metformin Rx for Now   Resume once off IV Insulin Drip       8.   Dyslipidemia   On Atorvastin Rx   Resume once off IV Insulin Drip     9.   Polycythemia vera   On Hydroxyurea   Resume in AM   10.   DVT Prophylaxis    Lovenox    Code Status:  FULL CODE  Family Communication:    Wife at Bedside Disposition Plan:    Inpatient     Time spent:   [facility] Pager [phone]   If [place] Please Contact the Day Rounding Team MD for [facility]  If 7PM-7AM, Please Contact Night-Floor Coverage  [email or web address] Password [email or web address] 07/09/2014, 11:27 PM

## 2014-07-10 ENCOUNTER — Inpatient Hospital Stay (HOSPITAL_COMMUNITY): Payer: Medicare Other

## 2014-07-10 ENCOUNTER — Ambulatory Visit (HOSPITAL_COMMUNITY): Payer: Medicare Other

## 2014-07-10 ENCOUNTER — Encounter (HOSPITAL_COMMUNITY): Payer: Self-pay | Admitting: *Deleted

## 2014-07-10 DIAGNOSIS — I6330 Cerebral infarction due to thrombosis of unspecified cerebral artery: Secondary | ICD-10-CM | POA: Insufficient documentation

## 2014-07-10 DIAGNOSIS — E119 Type 2 diabetes mellitus without complications: Secondary | ICD-10-CM

## 2014-07-10 LAB — CBG MONITORING, ED
Glucose-Capillary: 359 mg/dL — ABNORMAL HIGH (ref 70–99)
Glucose-Capillary: 414 mg/dL — ABNORMAL HIGH (ref 70–99)

## 2014-07-10 LAB — BASIC METABOLIC PANEL
Anion gap: 13 (ref 5–15)
BUN: 9 mg/dL (ref 6–23)
CO2: 24 mEq/L (ref 19–32)
Calcium: 8.7 mg/dL (ref 8.4–10.5)
Chloride: 106 mEq/L (ref 96–112)
Creatinine, Ser: 0.79 mg/dL (ref 0.50–1.35)
GFR calc Af Amer: >90 mL/min (ref >90)
GFR calc non Af Amer: 88 mL/min — ABNORMAL LOW (ref >90)
Glucose, Bld: 102 mg/dL — ABNORMAL HIGH (ref 70–99)
Potassium: 3.4 mEq/L — ABNORMAL LOW (ref 3.7–5.3)
Sodium: 143 mEq/L (ref 137–147)

## 2014-07-10 LAB — GLUCOSE, CAPILLARY
Glucose-Capillary: 203 mg/dL — ABNORMAL HIGH (ref 70–99)
Glucose-Capillary: 158 mg/dL — ABNORMAL HIGH (ref 70–99)
Glucose-Capillary: 153 mg/dL — ABNORMAL HIGH (ref 70–99)
Glucose-Capillary: 93 mg/dL (ref 70–99)
Glucose-Capillary: 110 mg/dL — ABNORMAL HIGH (ref 70–99)
Glucose-Capillary: 130 mg/dL — ABNORMAL HIGH (ref 70–99)
Glucose-Capillary: 278 mg/dL — ABNORMAL HIGH (ref 70–99)
Glucose-Capillary: 231 mg/dL — ABNORMAL HIGH (ref 70–99)
Glucose-Capillary: 316 mg/dL — ABNORMAL HIGH (ref 70–99)

## 2014-07-10 LAB — CBC
HCT: 46.2 % (ref 39.0–52.0)
Hemoglobin: 14.6 g/dL (ref 13.0–17.0)
MCH: 25.2 pg — ABNORMAL LOW (ref 26.0–34.0)
MCHC: 31.6 g/dL (ref 30.0–36.0)
MCV: 79.7 fL (ref 78.0–100.0)
Platelets: 326 10*3/uL (ref 150–400)
RBC: 5.8 MIL/uL (ref 4.22–5.81)
RDW: 19.2 % — ABNORMAL HIGH (ref 11.5–15.5)
WBC: 12.5 10*3/uL — ABNORMAL HIGH (ref 4.0–10.5)

## 2014-07-10 MED ORDER — SODIUM CHLORIDE 0.9 % IJ SOLN
3.0000 mL | Freq: Two times a day (BID) | INTRAMUSCULAR | Status: DC
  Administered 2014-07-10 (×2): 3 mL via INTRAVENOUS

## 2014-07-10 MED ORDER — CARVEDILOL 6.25 MG PO TABS
6.2500 mg | ORAL_TABLET | Freq: Two times a day (BID) | ORAL | Status: DC
  Administered 2014-07-10 – 2014-07-13 (×8): 6.25 mg via ORAL
  Filled 2014-07-10 (×10): qty 1

## 2014-07-10 MED ORDER — ONDANSETRON HCL 4 MG/2ML IJ SOLN: 4.0000 mg | Freq: Four times a day (QID) | INTRAMUSCULAR | Status: DC | PRN

## 2014-07-10 MED ORDER — ONDANSETRON HCL 4 MG PO TABS: 4.0000 mg | ORAL_TABLET | Freq: Four times a day (QID) | ORAL | Status: DC | PRN

## 2014-07-10 MED ORDER — ATORVASTATIN CALCIUM 40 MG PO TABS
60.0000 mg | ORAL_TABLET | Freq: Every day | ORAL | Status: DC
  Administered 2014-07-10 – 2014-07-13 (×4): 60 mg via ORAL
  Filled 2014-07-10 (×2): qty 2
  Filled 2014-07-10 (×3): qty 1
  Filled 2014-07-10: qty 2
  Filled 2014-07-10: qty 1

## 2014-07-10 MED ORDER — ENOXAPARIN SODIUM 40 MG/0.4ML ~~LOC~~ SOLN
40.0000 mg | SUBCUTANEOUS | Status: DC
  Administered 2014-07-10 – 2014-07-13 (×4): 40 mg via SUBCUTANEOUS
  Filled 2014-07-10 (×4): qty 0.4

## 2014-07-10 MED ORDER — INSULIN GLARGINE 100 UNIT/ML ~~LOC~~ SOLN: 5.0000 [IU] | Freq: Every day | SUBCUTANEOUS | Status: DC

## 2014-07-10 MED ORDER — ACETAMINOPHEN 650 MG RE SUPP
650.0000 mg | Freq: Four times a day (QID) | RECTAL | Status: DC | PRN
  Administered 2014-07-10: 650 mg via RECTAL
  Filled 2014-07-10: qty 1

## 2014-07-10 MED ORDER — ALUM & MAG HYDROXIDE-SIMETH 200-200-20 MG/5ML PO SUSP: 30.0000 mL | Freq: Four times a day (QID) | ORAL | Status: DC | PRN

## 2014-07-10 MED ORDER — SODIUM CHLORIDE 0.9 % IV SOLN
INTRAVENOUS | Status: DC
  Filled 2014-07-10: qty 2.5

## 2014-07-10 MED ORDER — INSULIN ASPART 100 UNIT/ML ~~LOC~~ SOLN
0.0000 [IU] | Freq: Every day | SUBCUTANEOUS | Status: DC
  Administered 2014-07-10: 4 [IU] via SUBCUTANEOUS
  Administered 2014-07-11: 3 [IU] via SUBCUTANEOUS
  Administered 2014-07-12: 4 [IU] via SUBCUTANEOUS

## 2014-07-10 MED ORDER — ONDANSETRON HCL 4 MG/2ML IJ SOLN: 4.0000 mg | Freq: Three times a day (TID) | INTRAMUSCULAR | Status: DC | PRN

## 2014-07-10 MED ORDER — LEVETIRACETAM IN NACL 1000 MG/100ML IV SOLN
1000.0000 mg | Freq: Once | INTRAVENOUS | Status: AC
  Administered 2014-07-10: 1000 mg via INTRAVENOUS
  Filled 2014-07-10: qty 100

## 2014-07-10 MED ORDER — SODIUM CHLORIDE 0.9 % IJ SOLN
3.0000 mL | Freq: Two times a day (BID) | INTRAMUSCULAR | Status: DC
  Administered 2014-07-10 – 2014-07-11 (×3): 3 mL via INTRAVENOUS

## 2014-07-10 MED ORDER — CETYLPYRIDINIUM CHLORIDE 0.05 % MT LIQD
7.0000 mL | Freq: Two times a day (BID) | OROMUCOSAL | Status: DC
  Administered 2014-07-11 – 2014-07-13 (×4): 7 mL via OROMUCOSAL

## 2014-07-10 MED ORDER — NITROGLYCERIN 0.4 MG SL SUBL: 0.4000 mg | SUBLINGUAL_TABLET | SUBLINGUAL | Status: DC | PRN

## 2014-07-10 MED ORDER — DEXTROSE-NACL 5-0.45 % IV SOLN
INTRAVENOUS | Status: DC
  Administered 2014-07-10: 07:00:00 via INTRAVENOUS

## 2014-07-10 MED ORDER — ACETAMINOPHEN 325 MG PO TABS
650.0000 mg | ORAL_TABLET | Freq: Four times a day (QID) | ORAL | Status: DC | PRN
  Administered 2014-07-12: 650 mg via ORAL
  Filled 2014-07-10: qty 2

## 2014-07-10 MED ORDER — LISINOPRIL 20 MG PO TABS
20.0000 mg | ORAL_TABLET | Freq: Every day | ORAL | Status: DC
  Administered 2014-07-10 – 2014-07-13 (×4): 20 mg via ORAL
  Filled 2014-07-10 (×4): qty 1

## 2014-07-10 MED ORDER — LEVETIRACETAM IN NACL 500 MG/100ML IV SOLN
500.0000 mg | Freq: Two times a day (BID) | INTRAVENOUS | Status: DC
  Administered 2014-07-10 – 2014-07-13 (×7): 500 mg via INTRAVENOUS
  Filled 2014-07-10 (×9): qty 100

## 2014-07-10 MED ORDER — CHLORHEXIDINE GLUCONATE 0.12 % MT SOLN
15.0000 mL | Freq: Two times a day (BID) | OROMUCOSAL | Status: DC
  Administered 2014-07-10 – 2014-07-13 (×5): 15 mL via OROMUCOSAL
  Filled 2014-07-10 (×7): qty 15

## 2014-07-10 MED ORDER — ASPIRIN EC 81 MG PO TBEC
81.0000 mg | DELAYED_RELEASE_TABLET | Freq: Every day | ORAL | Status: DC
  Administered 2014-07-10 – 2014-07-13 (×4): 81 mg via ORAL
  Filled 2014-07-10 (×4): qty 1

## 2014-07-10 MED ORDER — INSULIN GLARGINE 100 UNIT/ML ~~LOC~~ SOLN
5.0000 [IU] | Freq: Every day | SUBCUTANEOUS | Status: DC
  Administered 2014-07-10 – 2014-07-12 (×3): 5 [IU] via SUBCUTANEOUS
  Filled 2014-07-10 (×4): qty 0.05

## 2014-07-10 MED ORDER — SODIUM CHLORIDE 0.9 % IJ SOLN: 3.0000 mL | INTRAMUSCULAR | Status: DC | PRN

## 2014-07-10 MED ORDER — HYDROXYUREA 500 MG PO CAPS
500.0000 mg | ORAL_CAPSULE | Freq: Two times a day (BID) | ORAL | Status: DC
  Administered 2014-07-10 – 2014-07-13 (×7): 500 mg via ORAL
  Filled 2014-07-10 (×9): qty 1

## 2014-07-10 MED ORDER — INSULIN REGULAR BOLUS VIA INFUSION
0.0000 [IU] | Freq: Three times a day (TID) | INTRAVENOUS | Status: DC
  Filled 2014-07-10: qty 10

## 2014-07-10 MED ORDER — HYDROMORPHONE HCL 1 MG/ML IJ SOLN: 1.0000 mg | INTRAMUSCULAR | Status: DC | PRN

## 2014-07-10 MED ORDER — ALBUTEROL SULFATE (2.5 MG/3ML) 0.083% IN NEBU: 3.0000 mL | INHALATION_SOLUTION | RESPIRATORY_TRACT | Status: DC | PRN

## 2014-07-10 MED ORDER — ISOSORBIDE MONONITRATE ER 30 MG PO TB24
30.0000 mg | ORAL_TABLET | Freq: Every day | ORAL | Status: DC
  Administered 2014-07-10 – 2014-07-13 (×4): 30 mg via ORAL
  Filled 2014-07-10 (×4): qty 1

## 2014-07-10 MED ORDER — DEXTROSE 50 % IV SOLN: 25.0000 mL | INTRAVENOUS | Status: DC | PRN

## 2014-07-10 MED ORDER — LORAZEPAM 2 MG/ML IJ SOLN: 1.0000 mg | INTRAMUSCULAR | Status: DC | PRN

## 2014-07-10 MED ORDER — INSULIN ASPART 100 UNIT/ML ~~LOC~~ SOLN
0.0000 [IU] | Freq: Three times a day (TID) | SUBCUTANEOUS | Status: DC
Start: 2014-07-10 — End: 2014-07-13
  Administered 2014-07-10: 8 [IU] via SUBCUTANEOUS
  Administered 2014-07-10 – 2014-07-11 (×3): 5 [IU] via SUBCUTANEOUS
  Administered 2014-07-11: 3 [IU] via SUBCUTANEOUS
  Administered 2014-07-12: 8 [IU] via SUBCUTANEOUS
  Administered 2014-07-12: 5 [IU] via SUBCUTANEOUS
  Administered 2014-07-13: 8 [IU] via SUBCUTANEOUS
  Administered 2014-07-13: 11 [IU] via SUBCUTANEOUS
  Administered 2014-07-13: 5 [IU] via SUBCUTANEOUS

## 2014-07-10 MED ORDER — OXYCODONE HCL 5 MG PO TABS
5.0000 mg | ORAL_TABLET | ORAL | Status: DC | PRN
  Administered 2014-07-10: 5 mg via ORAL
  Filled 2014-07-10: qty 1

## 2014-07-10 MED ORDER — GLUCERNA SHAKE PO LIQD: 237.0000 mL | ORAL | Status: DC | PRN

## 2014-07-10 MED ORDER — SODIUM CHLORIDE 0.9 % IV SOLN: 250.0000 mL | INTRAVENOUS | Status: DC | PRN

## 2014-07-10 MED ORDER — CLOPIDOGREL BISULFATE 75 MG PO TABS
75.0000 mg | ORAL_TABLET | Freq: Every day | ORAL | Status: DC
Start: 2014-07-10 — End: 2014-07-13
  Administered 2014-07-10 – 2014-07-13 (×4): 75 mg via ORAL
  Filled 2014-07-10 (×4): qty 1

## 2014-07-10 MED ORDER — ACETAMINOPHEN-CODEINE #3 300-30 MG PO TABS
1.0000 | ORAL_TABLET | Freq: Four times a day (QID) | ORAL | Status: DC | PRN
  Administered 2014-07-11: 1 via ORAL
  Filled 2014-07-10: qty 1

## 2014-07-10 MED ORDER — VITAMIN D 1000 UNITS PO TABS
1000.0000 [IU] | ORAL_TABLET | Freq: Every day | ORAL | Status: DC
  Administered 2014-07-10 – 2014-07-13 (×4): 1000 [IU] via ORAL
  Filled 2014-07-10 (×4): qty 1

## 2014-07-10 MED ORDER — HYDROMORPHONE HCL 1 MG/ML IJ SOLN
0.5000 mg | INTRAMUSCULAR | Status: DC | PRN
  Administered 2014-07-10: 1 mg via INTRAVENOUS
  Filled 2014-07-10: qty 1

## 2014-07-10 MED ORDER — HYDRALAZINE HCL 20 MG/ML IJ SOLN: 10.0000 mg | Freq: Four times a day (QID) | INTRAMUSCULAR | Status: DC | PRN

## 2014-07-10 MED ORDER — FLUTICASONE PROPIONATE 50 MCG/ACT NA SUSP
1.0000 | Freq: Every day | NASAL | Status: DC
  Administered 2014-07-10 – 2014-07-13 (×4): 1 via NASAL
  Filled 2014-07-10: qty 16

## 2014-07-10 MED ORDER — SODIUM CHLORIDE 0.9 % IV SOLN
INTRAVENOUS | Status: DC
  Administered 2014-07-10: 02:00:00 via INTRAVENOUS

## 2014-07-10 NOTE — Progress Notes (Signed)
Patient seen and evaluated earlier this AM by my associate. Please refer to H and P regarding assessment and plan. Further work up per Neurology recommendations pending.  Glucostabilizer discontinued and pt will be placed on SQ insulin regimen please refer to orders for details.  May start diabetic diet.  Will reassess next am  Emery Dupuy, Linward Foster

## 2014-07-10 NOTE — ED Notes (Signed)
Will send medication upstairs with the pateint.

## 2014-07-10 NOTE — ED Notes (Signed)
Called ED pharmacist to see if insulin novolin is compatible with keppra.

## 2014-07-10 NOTE — Progress Notes (Signed)
1735 - pt arrived to unit per bed with 5W RN, Rachael. No distress noted. Will monitor.   Tyler Whitehead I 07/10/2014 8:02 PM

## 2014-07-10 NOTE — ED Notes (Signed)
Missed attempt to start 2nd IV for keppra, incompatible with insulin drip per pharmacy. Charge RN at the bedside to attempt access.

## 2014-07-10 NOTE — Progress Notes (Signed)
STROKE TEAM PROGRESS NOTE   HISTORY Tyler Whitehead is an 73 y.o. male history diabetes mellitus, hypertension, hyperlipidemia and peripheral vascular disease, as well as coronary artery disease, brought to the emergency room following a witnessed generalized seizure. Patient complained of visual changes prior to losing consciousness. He described flashing lights in the right visual field. Patient was postictal and confused on arrival in the emergency room. Mental status subsequently returned to normal. He was given a loading dose of Keppra 1000 mg IV and started on 500 mg twice a day for maintenance. He also indicates that he lost vision on the right side 1-2 days prior to admission. There's no previous history of stroke or TIA. He has been on aspirin and Plavix daily. CT scan of his head showed no acute intracranial abnormality. NIH stroke score was 2 (dense right homonymous hemianopsia). Last known well is unclear. Patient was not administered TPA secondary to unknown time last known well. He was admitted for further evaluation and treatment.   SUBJECTIVE (INTERVAL HISTORY) No family is at the bedside.  Overall he feels his condition is unchanged. Patient complains of severe headache.   OBJECTIVE Temp:  [98.2 F (36.8 C)-98.9 F (37.2 C)] 98.2 F (36.8 C) (10/03 0354) Pulse Rate:  [85-107] 86 (10/03 0921) Cardiac Rhythm:  [-] Normal sinus rhythm;Bundle branch block (10/03 0816) Resp:  [15-29] 18 (10/03 0354) BP: (141-186)/(76-120) 162/80 mmHg (10/03 1026) SpO2:  [94 %-99 %] 96 % (10/03 0757) Weight:  [81.647 kg (180 lb)-83.915 kg (185 lb)] 81.8 kg (180 lb 5.4 oz) (10/03 0201)   Recent Labs Lab 07/10/14 0544 07/10/14 0642 07/10/14 0742 07/10/14 0840 07/10/14 1205  GLUCAP 153* 93 110* 130* 278*    Recent Labs Lab 07/05/14 0857 07/07/14 1539 07/09/14 2000 07/10/14 0600  NA 134* 132* 133* 143  K 4.7 4.3 4.7 3.4*  CL 96 93* 95* 106  CO2 24 24 23 24   GLUCOSE 303* 453* 604* 102*   BUN 11 13 13 9   CREATININE 0.87 0.97 0.97 0.79  CALCIUM 9.7 9.6 9.1 8.7    Recent Labs Lab 07/05/14 0857 07/07/14 1539 07/09/14 2000  AST 18 15 14   ALT 12 14 12   ALKPHOS 123* 106 121*  BILITOT 0.9 1.3* 1.0  PROT 6.4 6.5 6.6  ALBUMIN 3.6 3.6 3.5    Recent Labs Lab 07/05/14 0857 07/07/14 1539 07/09/14 2000 07/10/14 0600  WBC 11.2* 12.5* 12.4* 12.5*  NEUTROABS 9.3* 10.6* 11.5*  --   HGB 18.1* 17.6* 16.2 14.6  HCT 55.5* 53.6* 49.7 46.2  MCV 80.7 80.4 80.6 79.7  PLT 577* 433* 331 326   No results found for this basename: CKTOTAL, CKMB, CKMBINDEX, TROPONINI,  in the last 168 hours No results found for this basename: LABPROT, INR,  in the last 72 hours No results found for this basename: COLORURINE, APPERANCEUR, LABSPEC, PHURINE, GLUCOSEU, HGBUR, BILIRUBINUR, KETONESUR, PROTEINUR, UROBILINOGEN, NITRITE, LEUKOCYTESUR,  in the last 72 hours  No results found for this basename: chol, trig, hdl, cholhdl, vldl, ldlcalc   No results found for this basename: HGBA1C   No results found for this basename: labopia, cocainscrnur, labbenz, amphetmu, thcu, labbarb    No results found for this basename: ETH,  in the last 168 hours  Dg Chest 1 View 07/09/2014    No acute findings.     Ct Head Wo Contrast 07/09/2014    1. There is no acute intracranial hemorrhage nor other acute intracranial abnormality. There are mild stable age related atrophic  changes. 2. There is no acute skull fracture.   Ct Cervical Spine Wo Contrast 07/09/2014   There is no acute cervical spine fracture nor dislocation.       PHYSICAL EXAM Awake alert. Afebrile. Head is nontraumatic. Neck is supple without bruit. Hearing is normal. Cardiac exam no murmur or gallop. Lungs are clear to auscultation. Distal pulses are well felt. Neurological Exam ;  Awake  Alert oriented x 3. Normal speech and language.Diminished recall 0/3.eye movements full without nystagmus.fundi were not visualized. Vision acuity  appear  normal.Dense right homonymous hemianopsia Hearing is normal. Palatal movements are normal. Face symmetric. Tongue midline. Normal strength, tone, reflexes and coordination. Normal sensation. Gait deferred. ASSESSMENT/PLAN  Tyler Whitehead is a 73 y.o. male with history of diabetes mellitus, hypertension, hyperlipidemia and peripheral vascular disease, and coronary artery disease  presenting with new onset generalized seizure activity and loss of vision on the right side. He did not receive IV t-PA due to delay in arrival. Initial CT imaging unrevealing.    Stroke:  L PCA posterior circulation infarct  MRI  ordered  MRA head  Ordered  MRA neck ordered  2D Echo  ordered   EEG pending   Loaded with keppra, continue 500 mg q12. Prn ativan  aspirin 81 mg orally every day and clopidogrel 75 mg orally every day prior to admission, now on aspirin 81 mg orally every day and clopidogrel 75 mg orally every day  HgbA1c ordered  Lovenox 40 mg sq daily for VTE prophylaxis  Carb Control thin liquids.   Bedrest, will d/c and order Up with assistance  Resultant R HH, short term memory deficits, poor recall, headache  Tylenol for tylenol #3 for headache. No ultram as it lowers seizure threshold   Therapy recommendations:  Have ordered PT, OT and ST, swallow eval  Ongoing aggressive risk factor management  Risk factor education  Patient counseled to be compliant with his antithrombotic medications  Disposition:  pending   Hypertension   Home meds:   Coreg, lisinopril  Permissive hypertension <220/120 for 24-48 hours and then gradually normalize within 5-7 days  BP goal long term normotensive BP 141-186/80-120 past 24h (07/10/2014 @ 1:26 PM)  Hyperlipidemia  On lipitor 40 at home, increased to 60  LDL ordered   Continue statin  Continue statin at discharge  Diabetes  Initially placed on glucose stabilizer, now off  HgbA1c ordered, Goal < 7.0  improving  Other  Stroke Risk Factors Advanced age   Coronary artery disease - stent 2014, CABG PVD  Other Pertinent History  Primary myelofibrosis  Polycythemia vera  Hospital day # 1  SHARON BIBY, MSN, RN, ANVP-BC, ANP-BC, GNP-BC [facility] Pager: [phone] 07/10/2014 2:13 PM   I have personally examined this patient, reviewed notes, independently viewed imaging studies, participated in medical decision making and plan of care. I have made any additions or clarifications directly to the above note. Agree with note above.   Antony Contras, MD Medical Director [facility] Pager: [phone] 07/10/2014 9:34 PM   To contact Stroke Continuity provider, please refer to [email or web address] After hours, contact General Neurology

## 2014-07-10 NOTE — Progress Notes (Signed)
Tyler Whitehead [id number] Admitted to 0U54: 07/10/2014 2:36 AM Attending Provider: Theressa Millard, MD    Tyler Whitehead is a 73 y.o. male patient admitted from ED awake, alert  & orientated  X 3,  Full Code, VSS - Blood pressure 164/79, pulse 97, temperature 98.9 F (37.2 C), temperature source Oral, resp. rate 20, height 5\' 7"  (1.702 m), weight 81.8 kg (180 lb 5.4 oz), SpO2 96.00%.RA, no c/o shortness of breath, no c/o chest pain, no distress noted. Tele #  placed and pt is currently running: BBB  IV site WDL:  with a transparent dsg that's clean dry and intact.  Allergies:  No Known Allergies   Past Medical History  Diagnosis Date  . Primary myelofibrosis   . Diverticular disease   . Hemorrhoid   . CAD (coronary artery disease)     Cath [place], right coronary artery occluded, but may occluded, patent radial artery to the PDA, patent LIMA to the LAD. The LAD had 75-85% stenosis which was treated with stenting. There was residual 75% apical disease. Vein graft to marginal branch patent with a stent.  . Peripheral vascular disease   . DM (diabetes mellitus)   . Hyperlipidemia   . HTN (hypertension)     History:  obtained from patient  Pt orientation to unit, room and routine. Information packet given to patient and safety video refused.  Admission INP armband ID verified with patient, and in place. SR up x 2, fall risk assessment complete with Patient verbalizing understanding of risks associated with falls. Pt verbalizes an understanding of how to use the call bell and to call for help before getting out of bed.  Skin, clean-dry- intact without evidence of bruising, or skin tears.   No evidence of skin break down noted on exam.    Will cont to monitor and assist as needed.  Parthenia Ames, RN 07/10/2014 2:36 AM

## 2014-07-10 NOTE — Consult Note (Signed)
Referring Physician: Rebeca Alert    Chief Complaint: New onset generalized seizure activity and loss of vision on the right side.  HPI: Tyler Whitehead is an 73 y.o. male history diabetes mellitus, hypertension, hyperlipidemia and peripheral vascular disease, as well as coronary artery disease, brought to the emergency room following a witnessed generalized seizure. Patient complained of visual changes prior to losing consciousness. He described flashing lights in the right visual field. Patient was postictal and confused on arrival in the emergency room. Mental status subsequently returned to normal. He was given a loading dose of Keppra 1000 mg IV and started on 500 mg twice a day for maintenance. He also indicates that he lost vision on the right side 1-2 days prior to admission. There's no previous history of stroke or TIA. He has been on aspirin and Plavix daily. CT scan of his head showed no acute intracranial abnormality. NIH stroke score was 2 (dense right homonymous hemianopsia).  LSN: Unclear tPA Given: No: Unclear when last known well mRankin:  Past Medical History  Diagnosis Date  . Primary myelofibrosis   . Diverticular disease   . Hemorrhoid   . CAD (coronary artery disease)     Cath [place], right coronary artery occluded, but may occluded, patent radial artery to the PDA, patent LIMA to the LAD. The LAD had 75-85% stenosis which was treated with stenting. There was residual 75% apical disease. Vein graft to marginal branch patent with a stent.  . Peripheral vascular disease   . DM (diabetes mellitus)   . Hyperlipidemia   . HTN (hypertension)     Family History  Problem Relation Age of Onset  . Hyperlipidemia Maternal Grandmother   . Asthma Father   . CAD      Multiple siblings     Medications: I have reviewed the patient's current medications.  ROS: History obtained from spouse and the patient  General ROS: negative for - chills, fatigue, fever, night sweats,  weight gain or weight loss Psychological ROS: negative for - behavioral disorder, hallucinations, memory difficulties, mood swings or suicidal ideation Ophthalmic ROS: As noted in history of present illness ENT ROS: negative for - epistaxis, nasal discharge, oral lesions, sore throat, tinnitus or vertigo Allergy and Immunology ROS: negative for - hives or itchy/watery eyes Hematological and Lymphatic ROS: negative for - bleeding problems, bruising or swollen lymph nodes Endocrine ROS: negative for - galactorrhea, hair pattern changes, polydipsia/polyuria or temperature intolerance Respiratory ROS: negative for - cough, hemoptysis, shortness of breath or wheezing Cardiovascular ROS: negative for - chest pain, dyspnea on exertion, edema or irregular heartbeat Gastrointestinal ROS: negative for - abdominal pain, diarrhea, hematemesis, nausea/vomiting or stool incontinence Genito-Urinary ROS: negative for - dysuria, hematuria, incontinence or urinary frequency/urgency Musculoskeletal ROS: negative for - joint swelling or muscular weakness Neurological ROS: as noted in HPI Dermatological ROS: negative for rash and skin lesion changes Physical Examination: Blood pressure 154/76, pulse 106, temperature 98.4 F (36.9 C), temperature source Oral, resp. rate 24, height 5\' 7"  (1.702 m), weight 81.647 kg (180 lb), SpO2 98.00%.  Neurologic Examination: Mental Status: Alert, oriented, thought content appropriate.  Speech fluent without evidence of aphasia. Able to follow commands without difficulty. Cranial Nerves: II-dense right homonymous hemianopsia. III/IV/VI-Pupils were equal and reacted. Extraocular movements were full and conjugate.    V/VII-no facial numbness and no facial weakness. VIII-normal. X-normal speech and symmetrical palatal movement. Motor: 5/5 bilaterally with normal tone and bulk Sensory: Normal throughout. Deep Tendon Reflexes: 1+ and symmetric.  Plantars: Flexor  bilaterally Cerebellar: Normal finger-to-nose testing.  Dg Chest 1 View  07/09/2014   CLINICAL DATA:  Seizures, acute syncope, initial encounter.  EXAM: CHEST - 1 VIEW  COMPARISON:  10/27/2010.  FINDINGS: Trachea is midline. Heart size stable. Lungs are somewhat low in volume with linear scarring at the left lung base. No airspace consolidation. Thickening of the minor fissure. No pleural fluid.  IMPRESSION: No acute findings.   Electronically Signed   By: Lorin Picket M.D.   On: 07/09/2014 21:51   Ct Head Wo Contrast  07/09/2014   CLINICAL DATA:  Severe headache; confusion ; history of standing up after date air in the coming week in falling to the floor with seizure like activity ; the patient was incontinent of urine and agitated after seizure activity ; hyperglycemia at the seen  EXAM: CT HEAD WITHOUT CONTRAST  CT CERVICAL SPINE WITHOUT CONTRAST  TECHNIQUE: Multidetector CT imaging of the head and cervical spine was performed following the standard protocol without intravenous contrast. Multiplanar CT image reconstructions of the cervical spine were also generated.  COMPARISON:  Noncontrast CT scan of the brain dated Dated June 30, 2014  FINDINGS: CT HEAD FINDINGS  There is mild diffuse cerebral and cerebellar atrophy with compensatory ventriculomegaly. There is no intracranial hemorrhage nor intracranial mass effect. There is no acute ischemic change. The cerebellum and brainstem are unremarkable. The observed paranasal sinuses exhibit no air-fluid levels. There is no acute skull fracture.  CT CERVICAL SPINE FINDINGS  The cervical vertebral bodies are preserved in height. The prevertebral soft tissue spaces are normal. The intervertebral disc space heights are well maintained. There is no perched facet nor spinous process fracture. The odontoid is intact. The visualized portions of the pulmonary apices and the soft tissues of the neck are unremarkable. There is mild prominence of the thyroid  isthmus.  IMPRESSION: 1. There is no acute intracranial hemorrhage nor other acute intracranial abnormality. There are mild stable age related atrophic changes. 2. There is no acute skull fracture. 3. There is no acute cervical spine fracture nor dislocation.   Electronically Signed   By: David  Martinique   On: 07/09/2014 21:40   Ct Cervical Spine Wo Contrast  07/09/2014   CLINICAL DATA:  Severe headache; confusion ; history of standing up after date air in the coming week in falling to the floor with seizure like activity ; the patient was incontinent of urine and agitated after seizure activity ; hyperglycemia at the seen  EXAM: CT HEAD WITHOUT CONTRAST  CT CERVICAL SPINE WITHOUT CONTRAST  TECHNIQUE: Multidetector CT imaging of the head and cervical spine was performed following the standard protocol without intravenous contrast. Multiplanar CT image reconstructions of the cervical spine were also generated.  COMPARISON:  Noncontrast CT scan of the brain dated Dated June 30, 2014  FINDINGS: CT HEAD FINDINGS  There is mild diffuse cerebral and cerebellar atrophy with compensatory ventriculomegaly. There is no intracranial hemorrhage nor intracranial mass effect. There is no acute ischemic change. The cerebellum and brainstem are unremarkable. The observed paranasal sinuses exhibit no air-fluid levels. There is no acute skull fracture.  CT CERVICAL SPINE FINDINGS  The cervical vertebral bodies are preserved in height. The prevertebral soft tissue spaces are normal. The intervertebral disc space heights are well maintained. There is no perched facet nor spinous process fracture. The odontoid is intact. The visualized portions of the pulmonary apices and the soft tissues of the neck are unremarkable. There is mild prominence of  the thyroid isthmus.  IMPRESSION: 1. There is no acute intracranial hemorrhage nor other acute intracranial abnormality. There are mild stable age related atrophic changes. 2. There is no  acute skull fracture. 3. There is no acute cervical spine fracture nor dislocation.   Electronically Signed   By: David  Martinique   On: 07/09/2014 21:40    Assessment: 73 y.o. 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  Stroke Risk Factors - family history, hyperlipidemia and hypertension  Plan: 1. HgbA1c, fasting lipid panel 2. MRI, MRA  of the brain without contrast 3. PT consult, OT consult, Speech consult 4. Echocardiogram 5. Carotid dopplers 6. Prophylactic therapy-aspirin 81 mg per day and Plavix 75 mg per day 7. Continue Keppra at 500 mg twice a day 8. EEG, routine of the study   C.R. Nicole Kindred, MD [facility] [phone]  07/10/2014, 1:26 AM

## 2014-07-10 NOTE — Progress Notes (Addendum)
Rapid Response notified about current r/u of stoke. SLP eval ordered. Nursing keeping pt NPO until evaluated. VS stable. Complaining of headache 10/10. MD made aware and orders received.   3:03 PM Biby, NP communicated with via telephone about patient's pain and plan. EEG and MRI both plan to see patient at this time. NP called to clarify.  NP would like EEG to be completed today . RN attempting to contact EEG to place patient back on schedule.

## 2014-07-10 NOTE — Progress Notes (Signed)
Report given to Vicente Males, RN for transfer to [facility]

## 2014-07-10 NOTE — Progress Notes (Signed)
MD made aware of patients anion gap of 13, 0642 CBG 93, 0742 CBG 110, and glucostabilizer rate/dose change to 0units/hr. MD communicated to RN that new orders will be placed.

## 2014-07-10 NOTE — Progress Notes (Signed)
Attempted to receive report. 

## 2014-07-10 NOTE — Progress Notes (Signed)
INITIAL NUTRITION ASSESSMENT  DOCUMENTATION CODES Per approved criteria  -Not Applicable   INTERVENTION: Provided education handouts to patient and family. Add Glucerna Shake po daily, each supplement provides 220 kcal and 10 grams of protein, prn. Recommend diabetic coordinator consult. RD to continue to follow nutrition care plan.  NUTRITION DIAGNOSIS: Unintentional wt loss related to unknown etiology (?blood sugar related) as evidenced by 10 lb wt loss x 1 month.   Goal: Intake to meet >90% of estimated nutrition needs.  Monitor:  weight trends, lab trends, I/O's, PO intake, supplement tolerance  Reason for Assessment: Malnutrition Screening Tool  73 y.o. male  Admitting Dx: New onset seizure  ASSESSMENT: PMHx significant for IDDM, HTN, CAD, Hyperlipidemia, and polycythemia vera. Admitted with seizure-like activity s/p dinner. Work-up ongoing.  Per MD note, pt with probable acute R PCA territory ischemic infarction with associated generalized seizure.  Pt confirms that he has had weight loss. Per EPIC records, pt with 10lb wt loss x 1 month. Question if his lack of blood sugar control is contributing to this. Pt with breakfast currently and has eaten some of it. 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 He states that sometimes he forgets to bring his insulin with him too. Pt appears well-nourished - no muscle/fat mass loss on brief physical exam. Pt reports that his appetite is good.  CBG's: 93 - 130 (was 414 on admit) No HgbA1c available  Height: Ht Readings from Last 1 Encounters:  07/10/14 5\' 7"  (1.702 m)    Weight: Wt Readings from Last 1 Encounters:  07/10/14 180 lb 5.4 oz (81.8 kg)    Ideal Body Weight: 148 lb  % Ideal Body Weight: 122%  Wt Readings from Last 10 Encounters:  07/10/14 180 lb 5.4 oz (81.8 kg)  07/07/14 188 lb (85.276 kg)  07/05/14 180 lb (81.647 kg)  06/30/14 191 lb 4.8 oz (86.773 kg)  06/29/14 191 lb 4.8 oz (86.773 kg)   04/23/14 197 lb 6.4 oz (89.54 kg)  03/19/14 195 lb 3.2 oz (88.542 kg)  03/18/14 196 lb (88.905 kg)  03/18/14 196 lb 4.8 oz (89.041 kg)  01/19/14 190 lb (86.183 kg)    Usual Body Weight: 190 lb  % Usual Body Weight: 95%  BMI:  Body mass index is 28.24 kg/(m^2). WNL  Estimated Nutritional Needs: Kcal: 1800 - 2000 Protein: 70 - 85 g Fluid: 1.8 - 2 liters  Skin: L head incision  Diet Order: Carb Control  EDUCATION NEEDS: -Education not appropriate at this time   Intake/Output Summary (Last 24 hours) at 07/10/14 1025 Last data filed at 07/10/14 0808  Gross per 24 hour  Intake   1640 ml  Output    800 ml  Net    840 ml    Last BM: PTA  Labs:   Recent Labs Lab 07/07/14 1539 07/09/14 2000 07/10/14 0600  NA 132* 133* 143  K 4.3 4.7 3.4*  CL 93* 95* 106  CO2 24 23 24   BUN 13 13 9   CREATININE 0.97 0.97 0.79  CALCIUM 9.6 9.1 8.7  GLUCOSE 453* 604* 102*    CBG (last 3)   Recent Labs  07/10/14 0642 07/10/14 0742 07/10/14 0840  GLUCAP 93 110* 130*   No results found for this basename: HGBA1C   Lipid Panel  No results found for this basename: chol, trig, hdl, cholhdl, vldl, ldlcalc, ldldirect     Scheduled Meds: . antiseptic oral rinse  7 mL Mouth Rinse q12n4p  .  aspirin EC  81 mg Oral Daily  . atorvastatin  60 mg Oral Daily  . carvedilol  6.25 mg Oral BID WC  . chlorhexidine  15 mL Mouth Rinse BID  . cholecalciferol  1,000 Units Oral Daily  . clopidogrel  75 mg Oral Daily  . enoxaparin (LOVENOX) injection  40 mg Subcutaneous Q24H  . fluticasone  1 spray Each Nare Daily  . hydroxyurea  500 mg Oral BID  . insulin aspart  0-15 Units Subcutaneous TID WC  . insulin aspart  0-5 Units Subcutaneous QHS  . insulin glargine  5 Units Subcutaneous QHS  . isosorbide mononitrate  30 mg Oral Daily  . levETIRAcetam  500 mg Intravenous Q12H  . lisinopril  20 mg Oral Daily  . sodium chloride  3 mL Intravenous Q12H  . sodium chloride  3 mL Intravenous Q12H     Continuous Infusions:   Past Medical History  Diagnosis Date  . Primary myelofibrosis   . Diverticular disease   . Hemorrhoid   . CAD (coronary artery disease)     Cath [place], right coronary artery occluded, but may occluded, patent radial artery to the PDA, patent LIMA to the LAD. The LAD had 75-85% stenosis which was treated with stenting. There was residual 75% apical disease. Vein graft to marginal branch patent with a stent.  . Peripheral vascular disease   . DM (diabetes mellitus)   . Hyperlipidemia   . HTN (hypertension)     Past Surgical History  Procedure Laterality Date  . Coronary artery bypass graft  2000    LM occluded, LIMA to LAD patent but distal native LAD disease, saphenous vein bypass graft to OM with 95% stenosis in the midportion, right coronary artery occluded, radial artery graft to right coronary artery patent but was diffusely small. Marginal graft stented. 7 2014. Of note he had multiple previous stents in the right coronary artery. The distal LAD was treated with angioplasty. I  . Cardiac catheterization  May 2014  . Percutaneous coronary stent intervention (pci-s)  April 27 2013  . Vein surgery      Inda Coke MS, RD, LDN Inpatient Registered Dietitian After-hours pager: [phone]

## 2014-07-10 NOTE — ED Notes (Signed)
Attempted IV x2, unsuccessful. Dr. Nicole Kindred (neuro) at [facility].

## 2014-07-10 NOTE — Progress Notes (Signed)
1535 - report rec'd from [place], RN in [place]. Will monitor for pt's arrival to unit.   Angeline Slim I 07/10/2014 8:01 PM

## 2014-07-10 NOTE — Evaluation (Signed)
SLP Cancellation Note  Patient Details Name: Tyler Whitehead MRN: [id number] DOB: 1940-11-14   Cancelled treatment:      Reason Eval/Treat Not Completed:  Pt having incredible amount of head pain- pt bending over in pain.  Declined to participate in cognitive linguistic evaluation at this time  Will re-attempt evaluation as schedule allows, RN made aware.    Luanna Salk, [facility] SLP [phone]

## 2014-07-11 DIAGNOSIS — I517 Cardiomegaly: Secondary | ICD-10-CM

## 2014-07-11 LAB — GLUCOSE, CAPILLARY
Glucose-Capillary: 220 mg/dL — ABNORMAL HIGH (ref 70–99)
Glucose-Capillary: 185 mg/dL — ABNORMAL HIGH (ref 70–99)
Glucose-Capillary: 245 mg/dL — ABNORMAL HIGH (ref 70–99)
Glucose-Capillary: 295 mg/dL — ABNORMAL HIGH (ref 70–99)

## 2014-07-11 LAB — HEMOGLOBIN A1C
Hgb A1c MFr Bld: 12.9 % — ABNORMAL HIGH (ref <5.7)
Mean Plasma Glucose: 324 mg/dL — ABNORMAL HIGH (ref <117)

## 2014-07-11 LAB — LIPID PANEL
Cholesterol: 94 mg/dL (ref 0–200)
HDL: 34 mg/dL — ABNORMAL LOW (ref >39)
LDL Cholesterol: 40 mg/dL (ref 0–99)
Total CHOL/HDL Ratio: 2.8 RATIO
Triglycerides: 101 mg/dL (ref <150)
VLDL: 20 mg/dL (ref 0–40)

## 2014-07-11 NOTE — Progress Notes (Signed)
STROKE TEAM PROGRESS NOTE   HISTORY Tyler Whitehead is an 73 y.o. male history diabetes mellitus, hypertension, hyperlipidemia and peripheral vascular disease, as well as coronary artery disease, brought to the emergency room following a witnessed generalized seizure. Patient complained of visual changes prior to losing consciousness. He described flashing lights in the right visual field. Patient was postictal and confused on arrival in the emergency room. Mental status subsequently returned to normal. He was given a loading dose of Keppra 1000 mg IV and started on 500 mg twice a day for maintenance. He also indicates that he lost vision on the right side 1-2 days prior to admission. There's no previous history of stroke or TIA. He has been on aspirin and Plavix daily. CT scan of his head showed no acute intracranial abnormality. NIH stroke score was 2 (dense right homonymous hemianopsia). Last known well is unclear. Patient was not administered TPA secondary to unknown time last known well. He was admitted for further evaluation and treatment.   SUBJECTIVE (INTERVAL HISTORY) No family is at the bedside.  Overall he feels his condition is unchanged. Patient states headache is improved. He acknowledges rt field loss   OBJECTIVE Temp:  [97.7 F (36.5 C)-98.6 F (37 C)] 97.7 F (36.5 C) (10/04 1341) Pulse Rate:  [78-97] 84 (10/04 1341) Cardiac Rhythm:  [-] Normal sinus rhythm (10/04 0800) Resp:  [16-20] 20 (10/04 1341) BP: (117-149)/(66-82) 133/82 mmHg (10/04 1341) SpO2:  [98 %-100 %] 99 % (10/04 1341)   Recent Labs Lab 07/10/14 1730 07/10/14 2119 07/11/14 0626 07/11/14 1142 07/11/14 1620  GLUCAP 231* 316* 220* 185* 245*    Recent Labs Lab 07/05/14 0857 07/07/14 1539 07/09/14 2000 07/10/14 0600  NA 134* 132* 133* 143  K 4.7 4.3 4.7 3.4*  CL 96 93* 95* 106  CO2 24 24 23 24   GLUCOSE 303* 453* 604* 102*  BUN 11 13 13 9   CREATININE 0.87 0.97 0.97 0.79  CALCIUM 9.7 9.6 9.1 8.7     Recent Labs Lab 07/05/14 0857 07/07/14 1539 07/09/14 2000  AST 18 15 14   ALT 12 14 12   ALKPHOS 123* 106 121*  BILITOT 0.9 1.3* 1.0  PROT 6.4 6.5 6.6  ALBUMIN 3.6 3.6 3.5    Recent Labs Lab 07/05/14 0857 07/07/14 1539 07/09/14 2000 07/10/14 0600  WBC 11.2* 12.5* 12.4* 12.5*  NEUTROABS 9.3* 10.6* 11.5*  --   HGB 18.1* 17.6* 16.2 14.6  HCT 55.5* 53.6* 49.7 46.2  MCV 80.7 80.4 80.6 79.7  PLT 577* 433* 331 326   No results found for this basename: CKTOTAL, CKMB, CKMBINDEX, TROPONINI,  in the last 168 hours No results found for this basename: LABPROT, INR,  in the last 72 hours No results found for this basename: COLORURINE, APPERANCEUR, LABSPEC, PHURINE, GLUCOSEU, HGBUR, BILIRUBINUR, KETONESUR, PROTEINUR, UROBILINOGEN, NITRITE, LEUKOCYTESUR,  in the last 72 hours     Component Value Date/Time   CHOL 94 07/11/2014 0430   Lab Results  Component Value Date   HGBA1C 12.9* 07/10/2014   No results found for this basename: labopia,  cocainscrnur,  labbenz,  amphetmu,  thcu,  labbarb    No results found for this basename: ETH,  in the last 168 hours  Dg Chest 1 View 07/09/2014    No acute findings.     Ct Head Wo Contrast 07/09/2014    1. There is no acute intracranial hemorrhage nor other acute intracranial abnormality. There are mild stable age related atrophic changes. 2. There is no  acute skull fracture.   Ct Cervical Spine Wo Contrast 07/09/2014   There is no acute cervical spine fracture nor dislocation.       PHYSICAL EXAM Awake alert. Afebrile. Head is nontraumatic. Neck is supple without bruit. Hearing is normal. Cardiac exam no murmur or gallop. Lungs are clear to auscultation. Distal pulses are well felt. Neurological Exam ;  Awake  Alert oriented x 3. Normal speech and language.Diminished recall 0/3.eye movements full without nystagmus.fundi were not visualized. Vision acuity  appear normal.Dense right homonymous hemianopsia Hearing is normal. Palatal movements  are normal. Face symmetric. Tongue midline. Normal strength, tone, reflexes and coordination. Normal sensation. Gait deferred. ASSESSMENT/PLAN  [name] is a 73 y.o. male with history of diabetes mellitus, hypertension, hyperlipidemia and peripheral vascular disease, and coronary artery disease  presenting with new onset generalized seizure activity and loss of vision on the right side. He did not receive IV t-PA due to delay in arrival. Initial CT imaging unrevealing.    Stroke:  L PCA posterior circulation infarct  MRI  Patchy left occipital and pareital infarcts MRA head Widespread intracranial atherosclerotic disease as described,  without proximal flow reducing lesion of the dominant RIGHT  vertebral, basilar, or either proximal PCA.  MRA neckUnremarkable noncontrast extracranial MRA, without flow reducing  lesion of the carotid or dominant RIGHT vertebral.    2D Echo  Left ventricle: The cavity size was normal. Systolic function was normal. Wall motion was normal; there were no regional wall motion abnormalities. Doppler parameters are consistent with abnormal left ventricular relaxation (grade 1 diastolic dysfunction).     EEG pending   Loaded with keppra, continue 500 mg q12. Prn ativan  aspirin 81 mg orally every day and clopidogrel 75 mg orally every day prior to admission, now on aspirin 81 mg orally every day and clopidogrel 75 mg orally every day  HgbA1c ordered  Lovenox 40 mg sq daily for VTE prophylaxis  Cardiac thin liquids.   Bedrest, will d/c and order Up with assistance  Resultant R HH, short term memory deficits, poor recall, headache  Tylenol for tylenol #3 for headache. No ultram as it lowers seizure threshold   Therapy recommendations:  Have ordered PT, OT and ST, swallow eval  Ongoing aggressive risk factor management  Risk factor education  Patient counseled to be compliant with his antithrombotic medications  Disposition:  pending    Hypertension   Home meds:   Coreg, lisinopril  Permissive hypertension <220/120 for 24-48 hours and then gradually normalize within 5-7 days  BP goal long term normotensive BP 141-186/80-120 past 24h (07/11/2014 @ 6:43 PM)  Hyperlipidemia  On lipitor 40 at home, increased to 60  LDL ordered   Continue statin  Continue statin at discharge  Diabetes  Initially placed on glucose stabilizer, now off  HgbA1c ordered, Goal < 7.0  improving  Other Stroke Risk Factors Advanced age   Coronary artery disease - stent 2014, CABG PVD  Other Pertinent History  Primary myelofibrosis  Polycythemia vera  Hospital day # 2  SHARON BIBY, MSN, RN, ANVP-BC, ANP-BC, GNP-BC [facility] Pager: [phone] 07/11/2014 6:43 PM   I have personally examined this patient, reviewed notes, independently viewed imaging studies, participated in medical decision making and plan of care. I have made any additions or clarifications directly to the above note. Agree with note above.   Antony Contras, MD Medical Director [facility] Pager: [phone] 07/11/2014 6:43 PM   To contact Stroke  Continuity provider, please refer to [email or web address] After hours, contact General Neurology

## 2014-07-11 NOTE — Progress Notes (Signed)
Echo Lab  2D Echocardiogram completed.  Latrisa Hellums L Anju Sereno, RDCS 07/11/2014 10:26 AM

## 2014-07-11 NOTE — Progress Notes (Signed)
[facility] PROGRESS NOTE  NAZAR KUAN KKO:[id number] DOB: 1941-08-16 DOA: 07/09/2014 PCP: [name], MD  Assessment/Plan: Principal Problem:   New onset seizure - Neurology managing - Pt currently on Keppra    Cerebral thrombosis with cerebral infarction - Neurology managing workup. - MRI reports acute left occipital cortical infarct correlating with dense right homonymous hemianopia  Active Problems:   CAD (coronary artery disease) - pt on aspirin and plavix - also on statin - stable no chest pain reported.    Dyslipidemia - stable on statin    Diabetic hyperosmolar non-ketotic state/DM2 - Pt is currently on SQ insulin and blood sugars relatively well controlled - Heart healthy diet.    Uncontrolled hypertension - well controlled currently - continue current regimen as neurology has not recommended discontinuing coreg, lisinopril, and imdur, and allow for permissive hypertension    Visual field defect - secondary to new onset stroke  Code Status: full Family Communication: d/c pt and daughters at bedside Disposition Plan: Pending continued improvement in condition and recommendations by physical therapist and neurology   Consultants:  Neurology  Procedures:  MRI/MRA of head  CT of cervical spine and head  MR angiogram of neck w/o contrast  Antibiotics:  None  HPI/Subjective: No new complaints currently, no reports of seizure like activity  Objective: Filed Vitals:   07/11/14 1013  BP: 117/66  Pulse: 78  Temp: 98.6 F (37 C)  Resp: 16    Intake/Output Summary (Last 24 hours) at 07/11/14 1326 Last data filed at 07/11/14 0834  Gross per 24 hour  Intake    240 ml  Output    250 ml  Net    -10 ml   Filed Weights   07/09/14 2233 07/10/14 0201 07/10/14 1756  Weight: 81.647 kg (180 lb) 81.8 kg (180 lb 5.4 oz) 84.369 kg (186 lb)    Exam:   General:  Pt in nad, alert and awake  Cardiovascular: rrr, no  mrg  Respiratory: cta bl, no wheezes  Abdomen: soft, Nt, nd  Musculoskeletal: no cyanosis or clubbing   Data Reviewed: Basic Metabolic Panel:  Recent Labs Lab 07/05/14 0857 07/07/14 1539 07/09/14 2000 07/10/14 0600  NA 134* 132* 133* 143  K 4.7 4.3 4.7 3.4*  CL 96 93* 95* 106  CO2 24 24 23 24   GLUCOSE 303* 453* 604* 102*  BUN 11 13 13 9   CREATININE 0.87 0.97 0.97 0.79  CALCIUM 9.7 9.6 9.1 8.7   Liver Function Tests:  Recent Labs Lab 07/05/14 0857 07/07/14 1539 07/09/14 2000  AST 18 15 14   ALT 12 14 12   ALKPHOS 123* 106 121*  BILITOT 0.9 1.3* 1.0  PROT 6.4 6.5 6.6  ALBUMIN 3.6 3.6 3.5   No results found for this basename: LIPASE, AMYLASE,  in the last 168 hours No results found for this basename: AMMONIA,  in the last 168 hours CBC:  Recent Labs Lab 07/05/14 0857 07/07/14 1539 07/09/14 2000 07/10/14 0600  WBC 11.2* 12.5* 12.4* 12.5*  NEUTROABS 9.3* 10.6* 11.5*  --   HGB 18.1* 17.6* 16.2 14.6  HCT 55.5* 53.6* 49.7 46.2  MCV 80.7 80.4 80.6 79.7  PLT 577* 433* 331 326   Cardiac Enzymes: No results found for this basename: CKTOTAL, CKMB, CKMBINDEX, TROPONINI,  in the last 168 hours BNP (last 3 results) No results found for this basename: PROBNP,  in the last 8760 hours CBG:  Recent Labs Lab 07/10/14 1205 07/10/14 1730 07/10/14 2119 07/11/14 0626 07/11/14 1142  GLUCAP 278* 231* 316* 220* 185*    No results found for this or any previous visit (from the past 240 hour(s)).   Studies: Dg Chest 1 View  07/09/2014   CLINICAL DATA:  Seizures, acute syncope, initial encounter.  EXAM: CHEST - 1 VIEW  COMPARISON:  10/27/2010.  FINDINGS: Trachea is midline. Heart size stable. Lungs are somewhat low in volume with linear scarring at the left lung base. No airspace consolidation. Thickening of the minor fissure. No pleural fluid.  IMPRESSION: No acute findings.   Electronically Signed   By: Lorin Picket M.D.   On: 07/09/2014 21:51   Ct Head Wo  Contrast  07/09/2014   CLINICAL DATA:  Severe headache; confusion ; history of standing up after date air in the coming week in falling to the floor with seizure like activity ; the patient was incontinent of urine and agitated after seizure activity ; hyperglycemia at the seen  EXAM: CT HEAD WITHOUT CONTRAST  CT CERVICAL SPINE WITHOUT CONTRAST  TECHNIQUE: Multidetector CT imaging of the head and cervical spine was performed following the standard protocol without intravenous contrast. Multiplanar CT image reconstructions of the cervical spine were also generated.  COMPARISON:  Noncontrast CT scan of the brain dated Dated June 30, 2014  FINDINGS: CT HEAD FINDINGS  There is mild diffuse cerebral and cerebellar atrophy with compensatory ventriculomegaly. There is no intracranial hemorrhage nor intracranial mass effect. There is no acute ischemic change. The cerebellum and brainstem are unremarkable. The observed paranasal sinuses exhibit no air-fluid levels. There is no acute skull fracture.  CT CERVICAL SPINE FINDINGS  The cervical vertebral bodies are preserved in height. The prevertebral soft tissue spaces are normal. The intervertebral disc space heights are well maintained. There is no perched facet nor spinous process fracture. The odontoid is intact. The visualized portions of the pulmonary apices and the soft tissues of the neck are unremarkable. There is mild prominence of the thyroid isthmus.  IMPRESSION: 1. There is no acute intracranial hemorrhage nor other acute intracranial abnormality. There are mild stable age related atrophic changes. 2. There is no acute skull fracture. 3. There is no acute cervical spine fracture nor dislocation.   Electronically Signed   By: David  Martinique   On: 07/09/2014 21:40   Ct Cervical Spine Wo Contrast  07/09/2014   CLINICAL DATA:  Severe headache; confusion ; history of standing up after date air in the coming week in falling to the floor with seizure like activity  ; the patient was incontinent of urine and agitated after seizure activity ; hyperglycemia at the seen  EXAM: CT HEAD WITHOUT CONTRAST  CT CERVICAL SPINE WITHOUT CONTRAST  TECHNIQUE: Multidetector CT imaging of the head and cervical spine was performed following the standard protocol without intravenous contrast. Multiplanar CT image reconstructions of the cervical spine were also generated.  COMPARISON:  Noncontrast CT scan of the brain dated Dated June 30, 2014  FINDINGS: CT HEAD FINDINGS  There is mild diffuse cerebral and cerebellar atrophy with compensatory ventriculomegaly. There is no intracranial hemorrhage nor intracranial mass effect. There is no acute ischemic change. The cerebellum and brainstem are unremarkable. The observed paranasal sinuses exhibit no air-fluid levels. There is no acute skull fracture.  CT CERVICAL SPINE FINDINGS  The cervical vertebral bodies are preserved in height. The prevertebral soft tissue spaces are normal. The intervertebral disc space heights are well maintained. There is no perched facet nor spinous process fracture. The odontoid is intact. The visualized portions  of the pulmonary apices and the soft tissues of the neck are unremarkable. There is mild prominence of the thyroid isthmus.  IMPRESSION: 1. There is no acute intracranial hemorrhage nor other acute intracranial abnormality. There are mild stable age related atrophic changes. 2. There is no acute skull fracture. 3. There is no acute cervical spine fracture nor dislocation.   Electronically Signed   By: David  Martinique   On: 07/09/2014 21:40   Mr Jodene Nam Head Wo Contrast  07/10/2014   CLINICAL DATA:  Headache and confusion. Generalized seizure. Dense RIGHT homonymous hemianopia. Stroke risk factors include hypertension, diabetes, and hyperlipidemia. Patient is anticoagulated.  EXAM: MRI HEAD WITHOUT CONTRAST  MRA HEAD WITHOUT CONTRAST  MRA NECK WITHOUT CONTRAST  TECHNIQUE: Multiplanar, multiecho pulse sequences of  the brain and surrounding structures were obtained without intravenous contrast. Angiographic images of the Circle of Willis were obtained using MRA technique without intravenous contrast. Angiographic images of the neck were obtained using MRA technique without intravenous contrast. Carotid stenosis measurements (when applicable) are obtained utilizing NASCET criteria, using the distal internal carotid diameter as the denominator.  COMPARISON:  CT head 07/09/2014.  FINDINGS: MRI HEAD FINDINGS  The patient was unable to remain motionless for the exam. Small or subtle lesions could be overlooked.  There is restricted diffusion primarily involving the LEFT occipital cortex, with minimal underlying white matter involvement, sparing the brainstem, cerebellum, and thalamus consistent with a distal LEFT PCA thrombotic event. No other areas of acute infarction are identified. Generalized atrophy with chronic microvascular ischemic change. No acute or chronic blood products. 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  Dysconjugate gaze. Partial empty sella. No acute sinus or mastoid disease. Compared with recent CT, the infarct is not visible.  MRA HEAD FINDINGS  Dolichoectatic cerebral vasculature. Moderate irregularity of the cavernous and supraclinoid ICA segments suspected flow reducing stenoses of 75% involving the RIGHT supraclinoid ICA and LEFT inferior cavernous ICA. Basilar artery widely patent with LEFT vertebral dominant. Moderately diseased LEFT vertebral ends in PICA.  RIGHT A1 ACA is dominant. Mildly irregular but non stenotic LEFT ACA. 50% proximal stenosis with diffuse irregularity of the RIGHT M1 MCA. Diffuse irregularity with suspected 75% stenosis LEFT distal M1 MCA.  Mildly irregular BILATERAL PCA vessels without proximal stenosis. Distal ramifications of the MCA and PCA vessels are poorly visualized. No definite  cerebellar branch occlusion.  MRA NECK FINDINGS  Noncontrast extracranial MRA demonstrates grossly patent carotid bifurcations bilaterally without obvious flow reducing lesion. No carotid dissection is evident. The RIGHT vertebral is widely patent through the neck and is the dominant vessel. LEFT vertebral is suboptimally evaluated due to its small size.  IMPRESSION: Acute LEFT occipital cortical infarct, correlating with dense RIGHT homonymous hemianopia.  Generalized atrophy with chronic microvascular ischemic change.  Widespread intracranial atherosclerotic disease as described, without proximal flow reducing lesion of the dominant RIGHT vertebral, basilar, or either proximal PCA.  Unremarkable noncontrast extracranial MRA, without flow reducing lesion of the carotid or dominant RIGHT vertebral.   Electronically Signed   By: Rolla Flatten M.D.   On: 07/10/2014 17:58   Mr Angiogram Neck Wo Contrast  07/10/2014   CLINICAL DATA:  Headache and confusion. Generalized seizure. Dense RIGHT homonymous hemianopia. Stroke risk factors include hypertension, diabetes, and hyperlipidemia. Patient is anticoagulated.  EXAM: MRI HEAD WITHOUT CONTRAST  MRA HEAD WITHOUT CONTRAST  MRA NECK WITHOUT CONTRAST  TECHNIQUE: Multiplanar, multiecho pulse sequences of the  brain and surrounding structures were obtained without intravenous contrast. Angiographic images of the Circle of Willis were obtained using MRA technique without intravenous contrast. Angiographic images of the neck were obtained using MRA technique without intravenous contrast. Carotid stenosis measurements (when applicable) are obtained utilizing NASCET criteria, using the distal internal carotid diameter as the denominator.  COMPARISON:  CT head 07/09/2014.  FINDINGS: MRI HEAD FINDINGS  The patient was unable to remain motionless for the exam. Small or subtle lesions could be overlooked.  There is restricted diffusion primarily involving the LEFT occipital cortex,  with minimal underlying white matter involvement, sparing the brainstem, cerebellum, and thalamus consistent with a distal LEFT PCA thrombotic event. No other areas of acute infarction are identified. Generalized atrophy with chronic microvascular ischemic change. No acute or chronic blood products. 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  Dysconjugate gaze. Partial empty sella. No acute sinus or mastoid disease. Compared with recent CT, the infarct is not visible.  MRA HEAD FINDINGS  Dolichoectatic cerebral vasculature. Moderate irregularity of the cavernous and supraclinoid ICA segments suspected flow reducing stenoses of 75% involving the RIGHT supraclinoid ICA and LEFT inferior cavernous ICA. Basilar artery widely patent with LEFT vertebral dominant. Moderately diseased LEFT vertebral ends in PICA.  RIGHT A1 ACA is dominant. Mildly irregular but non stenotic LEFT ACA. 50% proximal stenosis with diffuse irregularity of the RIGHT M1 MCA. Diffuse irregularity with suspected 75% stenosis LEFT distal M1 MCA.  Mildly irregular BILATERAL PCA vessels without proximal stenosis. Distal ramifications of the MCA and PCA vessels are poorly visualized. No definite cerebellar branch occlusion.  MRA NECK FINDINGS  Noncontrast extracranial MRA demonstrates grossly patent carotid bifurcations bilaterally without obvious flow reducing lesion. No carotid dissection is evident. The RIGHT vertebral is widely patent through the neck and is the dominant vessel. LEFT vertebral is suboptimally evaluated due to its small size.  IMPRESSION: Acute LEFT occipital cortical infarct, correlating with dense RIGHT homonymous hemianopia.  Generalized atrophy with chronic microvascular ischemic change.  Widespread intracranial atherosclerotic disease as described, without proximal flow reducing lesion of the dominant RIGHT vertebral, basilar, or either  proximal PCA.  Unremarkable noncontrast extracranial MRA, without flow reducing lesion of the carotid or dominant RIGHT vertebral.   Electronically Signed   By: Rolla Flatten M.D.   On: 07/10/2014 17:58   Mr Brain Wo Contrast  07/10/2014   CLINICAL DATA:  Headache and confusion. Generalized seizure. Dense RIGHT homonymous hemianopia. Stroke risk factors include hypertension, diabetes, and hyperlipidemia. Patient is anticoagulated.  EXAM: MRI HEAD WITHOUT CONTRAST  MRA HEAD WITHOUT CONTRAST  MRA NECK WITHOUT CONTRAST  TECHNIQUE: Multiplanar, multiecho pulse sequences of the brain and surrounding structures were obtained without intravenous contrast. Angiographic images of the Circle of Willis were obtained using MRA technique without intravenous contrast. Angiographic images of the neck were obtained using MRA technique without intravenous contrast. Carotid stenosis measurements (when applicable) are obtained utilizing NASCET criteria, using the distal internal carotid diameter as the denominator.  COMPARISON:  CT head 07/09/2014.  FINDINGS: MRI HEAD FINDINGS  The patient was unable to remain motionless for the exam. Small or subtle lesions could be overlooked.  There is restricted diffusion primarily involving the LEFT occipital cortex, with minimal underlying white matter involvement, sparing the brainstem, cerebellum, and thalamus consistent with a distal LEFT PCA thrombotic event. No other areas of acute infarction are identified. Generalized atrophy with chronic microvascular ischemic change. No acute or  chronic blood products. 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  Dysconjugate gaze. Partial empty sella. No acute sinus or mastoid disease. Compared with recent CT, the infarct is not visible.  MRA HEAD FINDINGS  Dolichoectatic cerebral vasculature. Moderate irregularity of the cavernous and supraclinoid ICA segments  suspected flow reducing stenoses of 75% involving the RIGHT supraclinoid ICA and LEFT inferior cavernous ICA. Basilar artery widely patent with LEFT vertebral dominant. Moderately diseased LEFT vertebral ends in PICA.  RIGHT A1 ACA is dominant. Mildly irregular but non stenotic LEFT ACA. 50% proximal stenosis with diffuse irregularity of the RIGHT M1 MCA. Diffuse irregularity with suspected 75% stenosis LEFT distal M1 MCA.  Mildly irregular BILATERAL PCA vessels without proximal stenosis. Distal ramifications of the MCA and PCA vessels are poorly visualized. No definite cerebellar branch occlusion.  MRA NECK FINDINGS  Noncontrast extracranial MRA demonstrates grossly patent carotid bifurcations bilaterally without obvious flow reducing lesion. No carotid dissection is evident. The RIGHT vertebral is widely patent through the neck and is the dominant vessel. LEFT vertebral is suboptimally evaluated due to its small size.  IMPRESSION: Acute LEFT occipital cortical infarct, correlating with dense RIGHT homonymous hemianopia.  Generalized atrophy with chronic microvascular ischemic change.  Widespread intracranial atherosclerotic disease as described, without proximal flow reducing lesion of the dominant RIGHT vertebral, basilar, or either proximal PCA.  Unremarkable noncontrast extracranial MRA, without flow reducing lesion of the carotid or dominant RIGHT vertebral.   Electronically Signed   By: Rolla Flatten M.D.   On: 07/10/2014 17:58    Scheduled Meds: . antiseptic oral rinse  7 mL Mouth Rinse q12n4p  . aspirin EC  81 mg Oral Daily  . atorvastatin  60 mg Oral Daily  . carvedilol  6.25 mg Oral BID WC  . chlorhexidine  15 mL Mouth Rinse BID  . cholecalciferol  1,000 Units Oral Daily  . clopidogrel  75 mg Oral Daily  . enoxaparin (LOVENOX) injection  40 mg Subcutaneous Q24H  . fluticasone  1 spray Each Nare Daily  . hydroxyurea  500 mg Oral BID  . insulin aspart  0-15 Units Subcutaneous TID WC  . insulin  aspart  0-5 Units Subcutaneous QHS  . insulin glargine  5 Units Subcutaneous QHS  . isosorbide mononitrate  30 mg Oral Daily  . levETIRAcetam  500 mg Intravenous Q12H  . lisinopril  20 mg Oral Daily  . sodium chloride  3 mL Intravenous Q12H  . sodium chloride  3 mL Intravenous Q12H   Continuous Infusions:     Time spent: > 35 minutes    Velvet Bathe  [facility] Pager [phone] If 7PM-7AM, please contact night-coverage at [email or web address], password [facility] 07/11/2014, 1:26 PM  LOS: 2 days

## 2014-07-11 NOTE — Evaluation (Signed)
Physical Therapy Evaluation Patient Details Name: Tyler Whitehead MRN: [id number] DOB: Jan 24, 1941 Today's Date: 07/11/2014   History of Present Illness  Tyler Whitehead is an 73 y.o. male history diabetes mellitus, hypertension, hyperlipidemia and peripheral vascular disease, as well as coronary artery disease, brought to the emergency room following a witnessed generalized seizure. Patient complained of visual changes prior to losing consciousness. MRI reports acute left occipital cortical infarct correlating with dense right homonymous hemianopia  Clinical Impression   Pt admitted with above. Pt currently with functional limitations due to the deficits listed below (see PT Problem List).  Pt will benefit from skilled PT to increase their independence and safety with mobility to allow discharge to the venue listed below.       Follow Up Recommendations Home health PT;Supervision/Assistance - 24 hour    Equipment Recommendations  Kasandra Knudsen (though will assess for need for RW vs cane in next sessions)    Recommendations for Other Services OT consult     Precautions / Restrictions Precautions Precautions: Fall Precaution Comments: tending to lose balance to L while walking      Mobility  Bed Mobility Overal bed mobility: Modified Independent             General bed mobility comments: used bedrails  Transfers Overall transfer level: Needs assistance Equipment used: 1 person hand held assist Transfers: Sit to/from Stand Sit to Stand: Min guard         General transfer comment: Cues for to slow down and for control of descent with stand to sit; stands quite impulsively  Ambulation/Gait Ambulation/Gait assistance: Min guard;Min assist Ambulation Distance (Feet): 520 Feet Assistive device: 1 person hand held assist (and using hallway rail prn) Gait Pattern/deviations: Step-through pattern;Staggering left;Narrow base of support     General Gait Details: erratic step width, at  times scissoring; 3 losses of balance to L requiring min assist to regain balance; much steadier gait when using hallway rail for UE support; Read room numbers on both sides of hallway -- pt with unable to acurately read any number; abel to identify wall closck, unable to read the time  Stairs            Wheelchair Mobility    Modified Rankin (Stroke Patients Only)       Balance Overall balance assessment: Needs assistance         Standing balance support: No upper extremity supported Standing balance-Leahy Scale: Fair                               Pertinent Vitals/Pain Pain Assessment: 0-10 Pain Score: 5  Pain Location: Headache Pain Descriptors / Indicators: Aching Pain Intervention(s): Limited activity within patient's tolerance;Monitored during session    [place] expects to be discharged to:: Private residence Living Arrangements: Spouse/significant other Available Help at Discharge: Family;Available 24 hours/day (Pt reports wife doesn't work, so she may be able to provide 24 hour supervision -- but pt did indicate she enjoys being out a lot) Type of Home: House Home Access: Level entry     Home Layout: One level Home Equipment: Cane - single point (But I believe his cane may be in his car in [place])      Prior Function Level of Independence: Independent         Comments: was still driving     Hand Dominance        Extremity/Trunk Assessment  Upper Extremity Assessment: Defer to OT evaluation;Overall WFL for tasks assessed           Lower Extremity Assessment: Generalized weakness         Communication   Communication: No difficulties  Cognition Arousal/Alertness: Awake/alert Behavior During Therapy: WFL for tasks assessed/performed;Impulsive Overall Cognitive Status: No family/caregiver present to determine baseline cognitive functioning (His joking manner may be masking some cognitive issues)                       General Comments      Exercises        Assessment/Plan    PT Assessment Patient needs continued PT services  PT Diagnosis Difficulty walking   PT Problem List Decreased strength;Decreased activity tolerance;Decreased balance;Decreased mobility;Decreased coordination;Decreased cognition;Decreased knowledge of use of DME;Decreased safety awareness;Pain;Decreased knowledge of precautions  PT Treatment Interventions DME instruction;Gait training;Stair training;Functional mobility training;Therapeutic activities;Therapeutic exercise;Balance training;Neuromuscular re-education;Patient/family education   PT Goals (Current goals can be found in the Care Plan section) Acute Rehab PT Goals Patient Stated Goal: wants to get better PT Goal Formulation: With patient Time For Goal Achievement: 07/25/14 Potential to Achieve Goals: Good    Frequency Min 4X/week   Barriers to discharge Decreased caregiver support Not sure that pt's wife can give 24 hour assist/supervision    Co-evaluation               End of Session Equipment Utilized During Treatment: Gait belt Activity Tolerance: Patient tolerated treatment well Patient left: in chair;with call bell/phone within reach;with chair alarm set Nurse Communication: Mobility status         Time: 2122-4825 PT Time Calculation (min): 25 min   Charges:   PT Evaluation $Initial PT Evaluation Tier I: 1 Procedure PT Treatments $Gait Training: 8-22 mins   PT G Codes:          Quin Hoop 07/11/2014, 5:04 PM  Roney [place], [place] Pager [phone] Office [phone]

## 2014-07-11 NOTE — Progress Notes (Signed)
Occupational Therapy Evaluation Patient Details Name: Tyler Whitehead MRN: [id number] DOB: [birth year] Today's Date: 07/11/2014    History of Present Illness Tyler Whitehead is a 73 y.o. male admitted 07/09/14 following a witnessed generalized seizure. Pt c/o visual changes prior to losing consciousness. 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 PMH of DM, HTN, hyperlipidemia, PVD, and CAD.    Clinical Impression   PTA pt lived at home and was independent with ADLs. Pt reported to OT that his visual has changed for the past 2 weeks and pt was somewhat aware of deficits. Pt is overall min guard for ADLs, however concerned about pt's vision, impulsivity, and cognitive status. Pt will benefit from acute OT to address vision and cognition and to promote independence with ADLs.     Follow Up Recommendations  Outpatient OT;Supervision/Assistance - 24 hour (neuro OP OT)    Equipment Recommendations  None recommended by OT    Recommendations for Other Services       Precautions / Restrictions Precautions Precautions: Fall Precaution Comments: tending to lose balance to L while walking      Mobility Bed Mobility Overal bed mobility: Modified Independent             General bed mobility comments: Pt up in recliner before/after OT session.  Transfers Overall transfer level: Needs assistance Equipment used: None  Transfers: Sit to/from Stand Sit to Stand: Min guard         General transfer comment: Min guard for safety, VC's to slow down. Pt somewhat impulsive with movement.     Balance Overall balance assessment: Needs assistance         Standing balance support: No upper extremity supported Standing balance-Leahy Scale: Fair                              ADL Overall ADL's : Needs assistance/impaired Eating/Feeding: Independent;Sitting   Grooming: Min guard;Standing   Upper Body Bathing: Set  up;Sitting   Lower Body Bathing: Min guard;Sit to/from stand   Upper Body Dressing : Set up;Sitting   Lower Body Dressing: Min guard;Sit to/from stand   Toilet Transfer: Min guard;Ambulation           Functional mobility during ADLs: Min guard General ADL Comments: Pt overall at min guard for ADLs. However, pt had difficulty with clock drawing and reading room numbers. Educated pt on vision impairment, but feel that he is not fully aware of deficits and makes statements such as "it will work itself out."     [employer]: Impaired (comment) (pupils appear asymmetric with Rt pupil lower)   Ocular Range of Motion: Within Functional Limits Tracking/Visual Pursuits: Decreased smoothness of horizontal tracking;Decreased smoothness of vertical tracking;Able to track stimulus in all quads without difficulty         Additional Comments: Pt appears aware of deficits at some times "I only see half your face" and not at others "I can see the full side over here (right side) without problems." Vision to be further assessed in functional context. Pt provided with visual scanning activity and demonstrated no clear visual scanning pattern. He missed 3/28 items on scanning activity (identifying a letter), however he utilized a strategy of picking up the paper and moving it around to help him search rather than keeping the paper flat on the table and scanning with a visual pattern. Educated pt on Left to Right  scanning pattern for environment.    Perception Perception Perception Tested?: No   Praxis Praxis Praxis tested?: Within functional limits    Pertinent Vitals/Pain Pain Assessment: 0-10 Pain Score: 5  Pain Location: Headache Pain Descriptors / Indicators: Aching Pain Intervention(s): Limited activity within patient's tolerance;Monitored during session     Hand Dominance Right   Extremity/Trunk Assessment Upper Extremity Assessment Upper Extremity Assessment: Overall WFL  for tasks assessed   Lower Extremity Assessment Lower Extremity Assessment: Defer to PT evaluation   Cervical / Trunk Assessment Cervical / Trunk Assessment: Normal   Communication Communication Communication: No difficulties   Cognition Arousal/Alertness: Awake/alert Behavior During Therapy: WFL for tasks assessed/performed;Impulsive Overall Cognitive Status: No family/caregiver present to determine baseline cognitive functioning Area of Impairment: Attention;Memory;Following commands;Safety/judgement;Awareness;Problem solving   Current Attention Level: Selective Memory: Decreased short-term memory Following Commands: Follows multi-step commands inconsistently Safety/Judgement: Decreased awareness of deficits;Decreased awareness of safety Awareness: Emergent Problem Solving: Slow processing General Comments: Feel that pt is able to mask some cognitive deficis with jokes. However, he appears to have some awareness of deficits but not full awareness. Pt had difficulty following commands for vision screen.               Home Living Family/patient expects to be discharged to:: Private residence Living Arrangements: Spouse/significant other Available Help at Discharge: Family;Available 24 hours/day Type of Home: House Home Access: Level entry     Home Layout: One level     Bathroom Shower/Tub: Tub/shower unit Shower/tub characteristics: Architectural technologist: Standard     Home Equipment: None   Additional Comments: Pt has a cane but says it is in DC.       Prior Functioning/Environment Level of Independence: Independent        Comments: was still driving    OT Diagnosis: Disturbance of vision;Acute pain   OT Problem List: Impaired balance (sitting and/or standing);Impaired vision/perception;Decreased cognition;Decreased safety awareness   OT Treatment/Interventions: Self-care/ADL training;Therapeutic activities;Cognitive  remediation/compensation;Visual/perceptual remediation/compensation;Patient/family education;Balance training    OT Goals(Current goals can be found in the care plan section) Acute Rehab OT Goals Patient Stated Goal: to get home soon OT Goal Formulation: With patient Time For Goal Achievement: 07/25/14 Potential to Achieve Goals: Good ADL Goals Additional ADL Goal #1: Pt will verbalize visual scanning strategy independently for increased safety with ADLs.  Additional ADL Goal #2: Pt will demonstrate visual scanning during functional mobility to promote safety and identify hazards.   OT Frequency: Min 2X/week    End of Session Equipment Utilized During Treatment: Gait belt  Activity Tolerance: Patient tolerated treatment well Patient left: in chair;with call bell/phone within reach;with chair alarm set   Time: 1740-8144 OT Time Calculation (min): 28 min Charges:  OT General Charges $OT Visit: 1 Procedure OT Evaluation $Initial OT Evaluation Tier I: 1 Procedure OT Treatments $Self Care/Home Management : 8-22 mins G-Codes:    Tyler Whitehead 08-10-2014, 6:46 PM

## 2014-07-12 ENCOUNTER — Inpatient Hospital Stay (HOSPITAL_COMMUNITY): Payer: Medicare Other

## 2014-07-12 ENCOUNTER — Encounter (HOSPITAL_COMMUNITY)
Admission: EM | Disposition: A | Discharge status: Home Health | Payer: Self-pay | Source: Home / Self Care | Attending: Family Medicine

## 2014-07-12 ENCOUNTER — Encounter (HOSPITAL_COMMUNITY): Payer: Self-pay | Admitting: Physician Assistant

## 2014-07-12 ENCOUNTER — Ambulatory Visit (HOSPITAL_COMMUNITY): Admit: 2014-07-12 | Payer: Self-pay | Admitting: Internal Medicine

## 2014-07-12 DIAGNOSIS — I2510 Atherosclerotic heart disease of native coronary artery without angina pectoris: Secondary | ICD-10-CM

## 2014-07-12 DIAGNOSIS — I6350 Cerebral infarction due to unspecified occlusion or stenosis of unspecified cerebral artery: Secondary | ICD-10-CM

## 2014-07-12 DIAGNOSIS — I10 Essential (primary) hypertension: Secondary | ICD-10-CM

## 2014-07-12 DIAGNOSIS — I341 Nonrheumatic mitral (valve) prolapse: Secondary | ICD-10-CM

## 2014-07-12 HISTORY — PX: LOOP RECORDER IMPLANT: SHX5477

## 2014-07-12 HISTORY — PX: TEE WITHOUT CARDIOVERSION: SHX5443

## 2014-07-12 LAB — URINALYSIS, ROUTINE W REFLEX MICROSCOPIC
Bilirubin Urine: NEGATIVE
Glucose, UA: >1000 mg/dL — AB
Hgb urine dipstick: NEGATIVE
Ketones, ur: 15 mg/dL — AB
Leukocytes, UA: NEGATIVE
Nitrite: NEGATIVE
Protein, ur: NEGATIVE mg/dL
Specific Gravity, Urine: 1.015 (ref 1.005–1.030)
Urobilinogen, UA: 0.2 mg/dL (ref 0.0–1.0)
pH: 5 (ref 5.0–8.0)

## 2014-07-12 LAB — GLUCOSE, CAPILLARY
Glucose-Capillary: 276 mg/dL — ABNORMAL HIGH (ref 70–99)
Glucose-Capillary: 248 mg/dL — ABNORMAL HIGH (ref 70–99)
Glucose-Capillary: 339 mg/dL — ABNORMAL HIGH (ref 70–99)

## 2014-07-12 LAB — URINE MICROSCOPIC-ADD ON

## 2014-07-12 SURGERY — ECHOCARDIOGRAM, TRANSESOPHAGEAL
Anesthesia: Moderate Sedation

## 2014-07-12 SURGERY — LOOP RECORDER IMPLANT
Anesthesia: LOCAL

## 2014-07-12 MED ORDER — MIDAZOLAM HCL 5 MG/ML IJ SOLN
INTRAMUSCULAR | Status: AC
  Filled 2014-07-12: qty 2

## 2014-07-12 MED ORDER — SODIUM CHLORIDE 0.9 % IR SOLN
80.0000 mg | Status: DC
  Filled 2014-07-12 (×2): qty 2

## 2014-07-12 MED ORDER — FENTANYL CITRATE 0.05 MG/ML IJ SOLN
INTRAMUSCULAR | Status: DC | PRN
  Administered 2014-07-12 (×2): 25 ug via INTRAVENOUS

## 2014-07-12 MED ORDER — FENTANYL CITRATE 0.05 MG/ML IJ SOLN
INTRAMUSCULAR | Status: AC
  Filled 2014-07-12: qty 2

## 2014-07-12 MED ORDER — DIPHENHYDRAMINE HCL 50 MG/ML IJ SOLN
INTRAMUSCULAR | Status: AC
  Filled 2014-07-12: qty 1

## 2014-07-12 MED ORDER — MIDAZOLAM HCL 10 MG/2ML IJ SOLN
INTRAMUSCULAR | Status: DC | PRN
  Administered 2014-07-12 (×2): 2 mg via INTRAVENOUS

## 2014-07-12 MED ORDER — CEFAZOLIN SODIUM-DEXTROSE 2-3 GM-% IV SOLR
2.0000 g | INTRAVENOUS | Status: AC
  Administered 2014-07-12: 2 g via INTRAVENOUS
  Filled 2014-07-12 (×2): qty 50

## 2014-07-12 MED ORDER — LIDOCAINE HCL (PF) 1 % IJ SOLN
INTRAMUSCULAR | Status: AC
  Filled 2014-07-12: qty 30

## 2014-07-12 MED ORDER — LIDOCAINE VISCOUS 2 % MT SOLN
OROMUCOSAL | Status: AC
  Filled 2014-07-12: qty 15

## 2014-07-12 MED ORDER — POTASSIUM CHLORIDE CRYS ER 20 MEQ PO TBCR
20.0000 meq | EXTENDED_RELEASE_TABLET | Freq: Once | ORAL | Status: AC
  Administered 2014-07-12: 20 meq via ORAL
  Filled 2014-07-12: qty 1

## 2014-07-12 MED ORDER — SODIUM CHLORIDE 0.9 % IV SOLN
INTRAVENOUS | Status: DC
  Administered 2014-07-12: 09:00:00 via INTRAVENOUS

## 2014-07-12 MED ORDER — LIDOCAINE VISCOUS 2 % MT SOLN
OROMUCOSAL | Status: DC | PRN
  Administered 2014-07-12: 10 mL via OROMUCOSAL

## 2014-07-12 NOTE — Procedures (Signed)
ELECTROENCEPHALOGRAM REPORT  Date of Study: 07/12/2014  Patient's Name: Tyler Whitehead MRN: [id number] Date of Birth: 14-Dec-1940  Referring Provider: Dr. Jana Hakim  Clinical History: This is a 73 year old man admitted on 07/10/14 with a witnessed seizure. He had reported visual changes prior to losing consciousness including flashing lights in the right visual field.   Medications: levETIRAcetam (KEPPRA) aspirin EC tablet 81 mg  atorvastatin (LIPITOR) tablet 60 mg  carvedilol (COREG) tablet 6.25 mg  ceFAZolin (ANCEF) IVPB 2 g/50 mL premix  cholecalciferol (VITAMIN D) tablet 1,000 Units  clopidogrel (PLAVIX) tablet 75 mg  enoxaparin (LOVENOX) injection 40 mg  gentamicin (GARAMYCIN) 80 mg in sodium chloride irrigation 0.9 % 500 mL irrigation  hydroxyurea (HYDREA) capsule 500 mg  insulin glargine (LANTUS) injection 5 Units  isosorbide mononitrate (IMDUR) 24 hr tablet 30 mg  lisinopril (PRINIVIL,ZESTRIL) tablet 20 mg  LORazepam (ATIVAN) injection 1 mg   Technical Summary: A multichannel digital EEG recording measured by the international 10-20 system with electrodes applied with paste and impedances below 5000 ohms performed in our laboratory with EKG monitoring in an awake and asleep patient.  Hyperventilation and photic stimulation were not performed.  The digital EEG was referentially recorded, reformatted, and digitally filtered in a variety of bipolar and referential montages for optimal display.    Description: The patient is awake and asleep during the recording.  During maximal wakefulness, there is a symmetric, medium voltage 10.5 Hz posterior dominant rhythm that attenuates with eye opening.  The record is symmetric.  During drowsiness and sleep, there is an increase in theta slowing of the background, with shifting asymmetry over the bilateral temporal regions.  Vertex waves and symmetric sleep spindles were seen.  There were no epileptiform discharges or electrographic  seizures seen.    EKG lead showed irregular rhythm.  Impression: This awake and asleep EEG is normal.    Clinical Correlation: A normal EEG does not exclude a clinical diagnosis of epilepsy. Clinical correlation is advised.   Ellouise Newer, M.D.

## 2014-07-12 NOTE — Progress Notes (Signed)
Patient returned from TEE, patient alert and oriented but a little drowsy. Will continue to monitor.

## 2014-07-12 NOTE — Progress Notes (Signed)
CARE MANAGEMENT NOTE 07/12/2014  Patient:  Tyler Whitehead, Tyler Whitehead   Account Number:  [id number]  Date Initiated:  07/12/2014  Documentation initiated by:  Olga Coaster  Subjective/Objective Assessment:   ADMITTED WITH SEIZURE/ STROKE     Action/Plan:   CM FOLLOWING FOR DCP   Anticipated DC Date:  07/16/2014   Anticipated DC Plan:  [place]  CM consult          Status of service:  In process, will continue to follow Medicare Important Message given?   (If response is "NO", the following Medicare IM given date fields will be blank)  Per UR Regulation:  Reviewed for med. necessity/level of care/duration of stay  Comments:  10/5/2015Mindi Slicker RN,BSN,MHA [phone]

## 2014-07-12 NOTE — Progress Notes (Signed)
Inpatient Diabetes Program Recommendations  AACE/ADA: New Consensus Statement on Inpatient Glycemic Control (2013)  Target Ranges:  Prepandial:   less than 140 mg/dL      Peak postprandial:   less than 180 mg/dL (1-2 hours)      Critically ill patients:  140 - 180 mg/dL   Reason for Assessment: Results for ZARION, OLIFF (MRN [id number]) as of 07/12/2014 12:46  Ref. Range 07/11/2014 06:26 07/11/2014 11:42 07/11/2014 16:20 07/11/2014 21:21 07/12/2014 06:19  Glucose-Capillary Latest Range: 70-99 mg/dL 220 (H) 185 (H) 245 (H) 295 (H) 276 (H)    Diabetes history: Type 2 Diabetes- patient admitted with CBG> 600 mg/dL. Note that A1C=12.9% indicating poor glycemic control. Outpatient Diabetes medications: Lantus 10 units bid Current orders for Inpatient glycemic control:  Lantus 5 units q HS, Novolog moderate tid with meals and HS  Please increase Lantus to 10 units bid- This was home dose. Will likely need adjustment based on elevated A1C.    Thanks, Adah Perl, RN, BC-ADM Inpatient Diabetes Coordinator Pager [phone]

## 2014-07-12 NOTE — Progress Notes (Signed)
    [facility] has been requested to perform a transesophageal echocardiogram and loop recorder implant on Tyler Whitehead for history of recent stroke.  After careful review of history and examination, the risks and benefits of transesophageal echocardiogram and loop recorder implant have been explained including risks of esophageal damage, perforation (1:10,000 risk), bleeding, pharyngeal hematoma as well as other potential complications associated with conscious sedation including aspiration, arrhythmia, respiratory failure and death. All questions were answered. Patient is willing to proceed.   Erlene Quan, NP 07/12/2014 7:54 AM

## 2014-07-12 NOTE — Progress Notes (Signed)
I agree with the following treatment note after reviewing documentation.   Baglio, Brynn OTR/L Pager: [phone] Office: [phone] .   

## 2014-07-12 NOTE — Op Note (Signed)
LA, LA appendage without masses. TV normal AV minimally thickened MV normal PV normal LVEF normal Mild fixed plaque in thoracic aorta No PFO as tested with injection of agitated saline or color doppleer Full report to follow

## 2014-07-12 NOTE — Interval H&P Note (Signed)
History and Physical Interval Note:  07/12/2014 11:17 AM  Tyler Whitehead  has presented today for surgery, with the diagnosis of stroke  The various methods of treatment have been discussed with the patient and family. After consideration of risks, benefits and other options for treatment, the patient has consented to  Procedure(s): TRANSESOPHAGEAL ECHOCARDIOGRAM (TEE) (N/A) as a surgical intervention .  The patient's history has been reviewed, patient examined, no change in status, stable for surgery.  I have reviewed the patient's chart and labs.  Questions were answered to the patient's satisfaction.     [name]

## 2014-07-12 NOTE — H&P (View-Only) (Signed)
STROKE TEAM PROGRESS NOTE   HISTORY Tyler Whitehead is an 73 y.o. male history diabetes mellitus, hypertension, hyperlipidemia and peripheral vascular disease, as well as coronary artery disease, brought to the emergency room following a witnessed generalized seizure. Patient complained of visual changes prior to losing consciousness. He described flashing lights in the right visual field. Patient was postictal and confused on arrival in the emergency room. Mental status subsequently returned to normal. He was given a loading dose of Keppra 1000 mg IV and started on 500 mg twice a day for maintenance. He also indicates that he lost vision on the right side 1-2 days prior to admission. There's no previous history of stroke or TIA. He has been on aspirin and Plavix daily. CT scan of his head showed no acute intracranial abnormality. NIH stroke score was 2 (dense right homonymous hemianopsia). Last known well is unclear. Patient was not administered TPA secondary to unknown time last known well. He was admitted for further evaluation and treatment.   SUBJECTIVE (INTERVAL HISTORY) No family is at the bedside.  Overall he feels his condition is unchanged. Patient states headache is improved. He acknowledges rt field loss   OBJECTIVE Temp:  [97.7 F (36.5 C)-98.6 F (37 C)] 97.7 F (36.5 C) (10/04 1341) Pulse Rate:  [78-97] 84 (10/04 1341) Cardiac Rhythm:  [-] Normal sinus rhythm (10/04 0800) Resp:  [16-20] 20 (10/04 1341) BP: (117-149)/(66-82) 133/82 mmHg (10/04 1341) SpO2:  [98 %-100 %] 99 % (10/04 1341)   Recent Labs Lab 07/10/14 1730 07/10/14 2119 07/11/14 0626 07/11/14 1142 07/11/14 1620  GLUCAP 231* 316* 220* 185* 245*    Recent Labs Lab 07/05/14 0857 07/07/14 1539 07/09/14 2000 07/10/14 0600  NA 134* 132* 133* 143  K 4.7 4.3 4.7 3.4*  CL 96 93* 95* 106  CO2 24 24 23 24   GLUCOSE 303* 453* 604* 102*  BUN 11 13 13 9   CREATININE 0.87 0.97 0.97 0.79  CALCIUM 9.7 9.6 9.1 8.7     Recent Labs Lab 07/05/14 0857 07/07/14 1539 07/09/14 2000  AST 18 15 14   ALT 12 14 12   ALKPHOS 123* 106 121*  BILITOT 0.9 1.3* 1.0  PROT 6.4 6.5 6.6  ALBUMIN 3.6 3.6 3.5    Recent Labs Lab 07/05/14 0857 07/07/14 1539 07/09/14 2000 07/10/14 0600  WBC 11.2* 12.5* 12.4* 12.5*  NEUTROABS 9.3* 10.6* 11.5*  --   HGB 18.1* 17.6* 16.2 14.6  HCT 55.5* 53.6* 49.7 46.2  MCV 80.7 80.4 80.6 79.7  PLT 577* 433* 331 326   No results found for this basename: CKTOTAL, CKMB, CKMBINDEX, TROPONINI,  in the last 168 hours No results found for this basename: LABPROT, INR,  in the last 72 hours No results found for this basename: COLORURINE, APPERANCEUR, LABSPEC, PHURINE, GLUCOSEU, HGBUR, BILIRUBINUR, KETONESUR, PROTEINUR, UROBILINOGEN, NITRITE, LEUKOCYTESUR,  in the last 72 hours     Component Value Date/Time   CHOL 94 07/11/2014 0430   Lab Results  Component Value Date   HGBA1C 12.9* 07/10/2014   No results found for this basename: labopia,  cocainscrnur,  labbenz,  amphetmu,  thcu,  labbarb    No results found for this basename: ETH,  in the last 168 hours  Dg Chest 1 View 07/09/2014    No acute findings.     Ct Head Wo Contrast 07/09/2014    1. There is no acute intracranial hemorrhage nor other acute intracranial abnormality. There are mild stable age related atrophic changes. 2. There is no  acute skull fracture.   Ct Cervical Spine Wo Contrast 07/09/2014   There is no acute cervical spine fracture nor dislocation.       PHYSICAL EXAM Awake alert. Afebrile. Head is nontraumatic. Neck is supple without bruit. Hearing is normal. Cardiac exam no murmur or gallop. Lungs are clear to auscultation. Distal pulses are well felt. Neurological Exam ;  Awake  Alert oriented x 3. Normal speech and language.Diminished recall 0/3.eye movements full without nystagmus.fundi were not visualized. Vision acuity  appear normal.Dense right homonymous hemianopsia Hearing is normal. Palatal movements  are normal. Face symmetric. Tongue midline. Normal strength, tone, reflexes and coordination. Normal sensation. Gait deferred. ASSESSMENT/PLAN  [name] is a 73 y.o. male with history of diabetes mellitus, hypertension, hyperlipidemia and peripheral vascular disease, and coronary artery disease  presenting with new onset generalized seizure activity and loss of vision on the right side. He did not receive IV t-PA due to delay in arrival. Initial CT imaging unrevealing.    Stroke:  L PCA posterior circulation infarct  MRI  Patchy left occipital and pareital infarcts MRA head Widespread intracranial atherosclerotic disease as described,  without proximal flow reducing lesion of the dominant RIGHT  vertebral, basilar, or either proximal PCA.  MRA neckUnremarkable noncontrast extracranial MRA, without flow reducing  lesion of the carotid or dominant RIGHT vertebral.    2D Echo  Left ventricle: The cavity size was normal. Systolic function was normal. Wall motion was normal; there were no regional wall motion abnormalities. Doppler parameters are consistent with abnormal left ventricular relaxation (grade 1 diastolic dysfunction).     EEG pending   Loaded with keppra, continue 500 mg q12. Prn ativan  aspirin 81 mg orally every day and clopidogrel 75 mg orally every day prior to admission, now on aspirin 81 mg orally every day and clopidogrel 75 mg orally every day  HgbA1c ordered  Lovenox 40 mg sq daily for VTE prophylaxis  Cardiac thin liquids.   Bedrest, will d/c and order Up with assistance  Resultant R HH, short term memory deficits, poor recall, headache  Tylenol for tylenol #3 for headache. No ultram as it lowers seizure threshold   Therapy recommendations:  Have ordered PT, OT and ST, swallow eval  Ongoing aggressive risk factor management  Risk factor education  Patient counseled to be compliant with his antithrombotic medications  Disposition:  pending    Hypertension   Home meds:   Coreg, lisinopril  Permissive hypertension <220/120 for 24-48 hours and then gradually normalize within 5-7 days  BP goal long term normotensive BP 141-186/80-120 past 24h (07/11/2014 @ 6:43 PM)  Hyperlipidemia  On lipitor 40 at home, increased to 60  LDL ordered   Continue statin  Continue statin at discharge  Diabetes  Initially placed on glucose stabilizer, now off  HgbA1c ordered, Goal < 7.0  improving  Other Stroke Risk Factors Advanced age   Coronary artery disease - stent 2014, CABG PVD  Other Pertinent History  Primary myelofibrosis  Polycythemia vera  Hospital day # 2  SHARON BIBY, MSN, RN, ANVP-BC, ANP-BC, GNP-BC [facility] Pager: [phone] 07/11/2014 6:43 PM   I have personally examined this patient, reviewed notes, independently viewed imaging studies, participated in medical decision making and plan of care. I have made any additions or clarifications directly to the above note. Agree with note above.   Antony Contras, MD Medical Director [facility] Pager: [phone] 07/11/2014 6:43 PM   To contact Stroke  Continuity provider, please refer to [email or web address] After hours, contact General Neurology

## 2014-07-12 NOTE — Progress Notes (Signed)
Echocardiogram Echocardiogram Transesophageal has been performed.  Tyler Whitehead 07/12/2014, 2:58 PM

## 2014-07-12 NOTE — Progress Notes (Signed)
OT Cancellation Note  Patient Details Name: ZELMA MAZARIEGO MRN: [id number] DOB: [birth year]   Cancelled Treatment:    Reason Eval/Treat Not Completed: Patient at procedure or test/ unavailable  Redmond Baseman 07/12/2014, 1:26 PM

## 2014-07-12 NOTE — Progress Notes (Signed)
Physical Therapy Treatment Patient Details Name: Tyler Whitehead MRN: [id number] DOB: [birth year] Today's Date: 07/12/2014    History of Present Illness Tyler Whitehead is a 73 y.o. male admitted 07/09/14 following a witnessed generalized seizure. Pt c/o visual changes prior to losing consciousness. 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 PMH of DM, HTN, hyperlipidemia, PVD, and CAD.     PT Comments    Pt is progressing well with his mobility, however, he still has an impaired gait pattern and some staggering during dynamic standing balance functional activities making him a fall risk.  I mentioned practicing with a cane or walker and pt was not open to this suggestion, yet he is reaching to hold the hallway railing during gait.  I will continue to encourage use of an AD (likely cane).  DGI next session.  PT will continue to follow acutely.   Follow Up Recommendations  Home health PT;Supervision/Assistance - 24 hour     Equipment Recommendations  None recommended by PT (he reports he has a cane at home)    Recommendations for Other Services   NA     Precautions / Restrictions Precautions Precautions: Fall Precaution Comments: tending to lose balance to L while walking    Mobility  Bed Mobility Overal bed mobility: Modified Independent                Transfers Overall transfer level: Needs assistance Equipment used: None Transfers: Sit to/from Stand Sit to Stand: Min guard         General transfer comment: Min guard assist for safety as pt is generally unsteady on his feet  Ambulation/Gait Ambulation/Gait assistance: Min assist Ambulation Distance (Feet): 510 Feet Assistive device: None (the occational hallway railing) Gait Pattern/deviations: Step-through pattern;Staggering right;Staggering left     General Gait Details: Pt with staggering gait pattern, unaware of his environment and safety issues,  refuses to try cane or RW today, but may try to get him to try at least a cane tomorrow as he is reaching for stability from the hallway railing.  One significant LOB requiring min assist to prevent fall while trying to back up.         Modified Rankin (Stroke Patients Only) Modified Rankin (Stroke Patients Only) Pre-Morbid Rankin Score: No symptoms Modified Rankin: Moderately severe disability     Balance Overall balance assessment: Needs assistance Sitting-balance support: Feet supported;No upper extremity supported Sitting balance-Leahy Scale: Good     Standing balance support: No upper extremity supported;Single extremity supported Standing balance-Leahy Scale: Fair                      Cognition Arousal/Alertness: Awake/alert Behavior During Therapy: WFL for tasks assessed/performed;Impulsive Overall Cognitive Status: Impaired/Different from baseline Area of Impairment: Awareness;Safety/judgement         Safety/Judgement: Decreased awareness of deficits;Decreased awareness of safety Awareness: Emergent   General Comments: Pt is aware he is unbalanced, but does not feel his impairments justify use of an assistive device (which they do).            Pertinent Vitals/Pain Pain Assessment: No/denies pain Pain Score: 0-No pain           PT Goals (current goals can now be found in the care plan section) Acute Rehab PT Goals Patient Stated Goal: to get home soon Progress towards PT goals: Progressing toward goals    Frequency  Min 4X/week    PT Plan Current  plan remains appropriate       End of Session Equipment Utilized During Treatment: Gait belt Activity Tolerance: Patient tolerated treatment well Patient left: in chair;with call bell/phone within reach;with chair alarm set     Time: 1323-1340 PT Time Calculation (min): 17 min  Charges:  $Gait Training: 8-22 mins                      Lorelie Biermann B. Thinh Cuccaro, PT, DPT [phone]    07/12/2014, 4:17 PM

## 2014-07-12 NOTE — Interval H&P Note (Signed)
History and Physical Interval Note:  07/12/2014 9:37 AM  Tyler Whitehead  has presented today for surgery, with the diagnosis of stroke  The various methods of treatment have been discussed with the patient and family. After consideration of risks, benefits and other options for treatment, the patient has consented to  Procedure(s): TRANSESOPHAGEAL ECHOCARDIOGRAM (TEE) (N/A) as a surgical intervention .  The patient's history has been reviewed, patient examined, no change in status, stable for surgery.  I have reviewed the patient's chart and labs.  Questions were answered to the patient's satisfaction.     [name]

## 2014-07-12 NOTE — CV Procedure (Addendum)
Electrophysiology procedure note  Procedure: Insertion of an implantable loop recorder  Preoperative diagnosis: Unexplained stroke  Postoperative diagnosis: Unexplained stroke  Description of the procedure: After informed consent was obtained, the patient was prepped and draped in the usual manner. 20 cc of lidocaine was infiltrated into the left pectoral region. A 1 cm stab incision was carried out. The Medtronic implantable loop recorder, serial number [id number], was inserted subcutaneously into the left pectoral region. The R wave measured 0.5 mV. Benzoin and Steri-Strips for pain on the skin. A bandage was placed. The patient was returned to his room in satisfactory condition. The patient had previously undergone transesophageal echo demonstrating no cause for his stroke.  Complications: No immediate procedure complications  Conclusion: Successful insertion of a Medtronic implantable loop recorder in a patient with cryptogenic stroke.  Cristopher Peru, M.D.

## 2014-07-12 NOTE — Progress Notes (Signed)
STROKE TEAM PROGRESS NOTE   HISTORY Tyler Whitehead is an 73 y.o. male history diabetes mellitus, hypertension, hyperlipidemia and peripheral vascular disease, as well as coronary artery disease, brought to the emergency room following a witnessed generalized seizure. Patient complained of visual changes prior to losing consciousness. He described flashing lights in the right visual field. Patient was postictal and confused on arrival in the emergency room. Mental status subsequently returned to normal. He was given a loading dose of Keppra 1000 mg IV and started on 500 mg twice a day for maintenance. He also indicates that he lost vision on the right side 1-2 days prior to admission. There's no previous history of stroke or TIA. He has been on aspirin and Plavix daily. CT scan of his head showed no acute intracranial abnormality. NIH stroke score was 2 (dense right homonymous hemianopsia). Last known well is unclear. Patient was not administered TPA secondary to unknown time last known well. He was admitted for further evaluation and treatment.   SUBJECTIVE (INTERVAL HISTORY) Patient currently in endo, awaiting procedure.   OBJECTIVE Temp:  [97.7 F (36.5 C)-98.4 F (36.9 C)] 98.4 F (36.9 C) (10/05 1038) Pulse Rate:  [67-88] 70 (10/05 1038) Cardiac Rhythm:  [-] Normal sinus rhythm (10/05 0800) Resp:  [11-20] 11 (10/05 1038) BP: (98-139)/(43-82) 132/70 mmHg (10/05 1038) SpO2:  [97 %-99 %] 99 % (10/05 1038)   Recent Labs Lab 07/11/14 0626 07/11/14 1142 07/11/14 1620 07/11/14 2121 07/12/14 0619  GLUCAP 220* 185* 245* 295* 276*    Recent Labs Lab 07/07/14 1539 07/09/14 2000 07/10/14 0600  NA 132* 133* 143  K 4.3 4.7 3.4*  CL 93* 95* 106  CO2 24 23 24   GLUCOSE 453* 604* 102*  BUN 13 13 9   CREATININE 0.97 0.97 0.79  CALCIUM 9.6 9.1 8.7    Recent Labs Lab 07/07/14 1539 07/09/14 2000  AST 15 14  ALT 14 12  ALKPHOS 106 121*  BILITOT 1.3* 1.0  PROT 6.5 6.6  ALBUMIN 3.6  3.5    Recent Labs Lab 07/07/14 1539 07/09/14 2000 07/10/14 0600  WBC 12.5* 12.4* 12.5*  NEUTROABS 10.6* 11.5*  --   HGB 17.6* 16.2 14.6  HCT 53.6* 49.7 46.2  MCV 80.4 80.6 79.7  PLT 433* 331 326   No results found for this basename: CKTOTAL, CKMB, CKMBINDEX, TROPONINI,  in the last 168 hours No results found for this basename: LABPROT, INR,  in the last 72 hours  Recent Labs  07/12/14 0550  COLORURINE YELLOW  LABSPEC 1.015  PHURINE 5.0  GLUCOSEU >1000*  HGBUR NEGATIVE  BILIRUBINUR NEGATIVE  KETONESUR 15*  PROTEINUR NEGATIVE  UROBILINOGEN 0.2  NITRITE NEGATIVE  LEUKOCYTESUR NEGATIVE    Lab Results  Component Value Date   HGBA1C 12.9* 07/10/2014   No results found for this basename: labopia,  cocainscrnur,  labbenz,  amphetmu,  thcu,  labbarb    No results found for this basename: ETH,  in the last 168 hours  Dg Chest 1 View 07/09/2014    No acute findings.     Ct Head Wo Contrast 07/09/2014    1. There is no acute intracranial hemorrhage nor other acute intracranial abnormality. There are mild stable age related atrophic changes. 2. There is no acute skull fracture.   Ct Cervical Spine Wo Contrast 07/09/2014   There is no acute cervical spine fracture nor dislocation.     MRI  Patchy left occipital and pareital infarcts  MRA head Widespread intracranial atherosclerotic disease  as described, without proximal flow reducing lesion of the dominant RIGHT vertebral, basilar, or either proximal PCA.   MRA neckUnremarkable noncontrast extracranial MRA, without flow reducing lesion of the carotid or dominant RIGHT vertebral.     2D Echo  Left ventricle: The cavity size was normal. Systolic function was normal. Wall motion was normal; there were no regional wall motion abnormalities. Doppler parameters are consistent with abnormal left ventricular relaxation (grade 1 diastolic dysfunction).  EEG normal   PHYSICAL EXAM Awake alert. Afebrile. Head is nontraumatic. Neck  is supple without bruit. Hearing is normal. Cardiac exam no murmur or gallop. Lungs are clear to auscultation. Distal pulses are well felt. Neurological Exam ;  Awake  Alert oriented x 3. Normal speech and language.Diminished recall 0/3.eye movements full without nystagmus.fundi were not visualized. Vision acuity  appear normal.Dense right homonymous hemianopsia Hearing is normal. Palatal movements are normal. Face symmetric. Tongue midline. Normal strength, tone, reflexes and coordination. Normal sensation. Gait deferred. ASSESSMENT/PLAN  [name] is a 73 y.o. male with history of diabetes mellitus, hypertension, hyperlipidemia and peripheral vascular disease, and coronary artery disease  presenting with new onset generalized seizure activity and loss of vision on the right side. He did not receive IV t-PA due to delay in arrival. Initial CT imaging unrevealing.    Stroke:  L PCA posterior circulation infarct  MRI  Patchy left occipital and pareital infarcts  MRA head Widespread intracranial atherosclerotic disease without proximal flow reducing lesion   MRA neck Unremarkable   2D Echo  No source of embolus  EEG normal  Loaded with keppra, continue 500 mg q12. Prn ativan  aspirin 81 mg orally every day and clopidogrel 75 mg orally every day prior to admission, now on aspirin 81 mg orally every day and clopidogrel 75 mg orally every day  Lovenox 40 mg sq daily for VTE prophylaxis  NPO for TEE TEE to look for embolic source. Arranged with [facility] for today.  If positive for PFO (patent foramen ovale), check bilateral lower extremity venous dopplers to rule out DVT as possible source of stroke.  If TEE negative, a [place] electrophysiologist will consult and consider placement of an implantable loop recorder to evaluate for atrial fibrillation as etiology of stroke. This has been explained to patient by Dr. Leonie Man and he are  agreeable.   Up with assistance  Resultant R HH, short term memory deficits, poor recall, headache  Tylenol for tylenol #3 for headache. No ultram as it lowers seizure threshold   Therapy recommendations:  OP PT, OP PT  Ongoing aggressive risk factor management  Disposition:  Home with OP therapies if has transportation, if not, then [facility]  Hypertension   Home meds:   Coreg, lisinopril  Agree with current treatment   BP goal long term normotensive  stable  Hyperlipidemia  On lipitor 40 at home, increased to 60  LDL ordered   Continue statin  Continue statin at discharge  Diabetes  Initially placed on glucose stabilizer, now off  HgbA1c 12.9, Goal < 7.0  uncontrolled  Other Stroke Risk Factors Advanced age   Coronary artery disease - stent 2014, CABG PVD  Other Pertinent History  Primary myelofibrosis  Polycythemia vera  Hospital day # 3  SHARON BIBY, MSN, RN, ANVP-BC, ANP-BC, [facility] Pager: [phone] 07/12/2014 11:33 AM  I have personally examined this patient, reviewed notes, independently viewed imaging studies, participated in medical decision making  and plan of care. I have made any additions or clarifications directly to the above note. Agree with note above.   Antony Contras, MD Medical Director [place] Pager: [phone] 07/12/2014 6:09 PM     To contact Stroke Continuity provider, please refer to [email or web address] After hours, contact General Neurology

## 2014-07-12 NOTE — Progress Notes (Signed)
Tried to give patient meds, but the [facility] is still on hold and would no let meds scan, called pharmacy, but they said to call the MD that they could not fix it. MD Dr Harrington Challenger paged and notified about.

## 2014-07-12 NOTE — Consult Note (Signed)
Electrophysiology Consultation Note  Patient ID: Tyler Whitehead, MRN: [id number], DOB/AGE: [birth year] 73 y.o. Admit date: 07/09/2014   Date of Consult: 07/12/2014 Primary Physician: Tyler Copa, MD Primary Cardiologist: Tyler Whitehead  Chief Complaint: seizure activity and loss of vision Reason for Consult: loop implantation  HPI: Tyler Whitehead is a 74 y/o with history of DM, HTN, HLD, PVD s/p RLE bypass, and CAD s/p CABG 2001 with PCI in 2014 (on ASA/Plavix prior to admission) who presented to [facility] on 07/10/14 with a witnessed seizure. He had c/o visual changes prior to losing consciousness including flashing lights in the right visual field. Post-seizure he was post-ictal and confused in the ED. Glucose was 602 in the ER. He was started on Keppra. CT head was nonacute but MRI showed acute left occipital cortical infarct, chronic microvascular changes, and widespread intracranial atherosclerotic disease, and partial empty sella. Labwork is pertinent for glucosuria >1000, LDL 40, hypokalemia of 3.4, WBC 12.5, A1C 12.9. 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 He denies any CP, SOB, nausea, vomiting or syncope. He does report intermittent palpitations. Tele c/w NSR with PACs and PVCs but no documented AF. He is feeling better today except mild headache.  Past Medical History  Diagnosis Date  . Primary myelofibrosis   . Diverticular disease   . Hemorrhoid   . CAD (coronary artery disease)     a. S/p 3V CABG 2000 with LIMA-LAD, left radial-PDA, SVG-OM1. b. 2014: stent to SVG-OM1; then [facility] to have obstruction distal to LAD anastamosis of LIMA s/p DES.   Marland Kitchen Peripheral vascular disease     a. R femoropopliteal bypass ~2012 in [place], reportedly shown to be occluded. Now followed by Dr. Gwenlyn Whitehead.  . DM (diabetes mellitus)   . Hyperlipidemia   . HTN (hypertension)   . RBBB        Most Recent Cardiac Studies: Cath [place] report entered by Tyler Whitehead: "right coronary artery occluded, but may occluded, patent radial artery to the PDA, patent LIMA to the LAD. The LAD had 75-85% stenosis which was treated with stenting. There was residual 75% apical disease. Vein graft to marginal branch patent with a stent."  2D Echo 07/11/14 - Left ventricle: The cavity size was normal. Systolic function was normal. Wall motion was normal; there were no regional wall motion abnormalities. Doppler parameters are consistent with abnormal left ventricular relaxation (grade 1 diastolic dysfunction). - Ventricular septum: Septal motion showed paradox. These changes are consistent with a post-thoracotomy state. - Left atrium: The atrium was mildly dilated. - Right ventricle: The cavity size was mildly dilated. Wall thickness was normal.    Surgical History:  Past Surgical History  Procedure Laterality Date  . Coronary artery bypass graft  2000    LM occluded, LIMA to LAD patent but distal native LAD disease, saphenous vein bypass graft to OM with 95% stenosis in the midportion, right coronary artery occluded, radial artery graft to right coronary artery patent but was diffusely small. Marginal graft stented. 7 2014. Of note he had multiple previous stents in the right coronary artery. The distal LAD was treated with angioplasty. I  . Cardiac catheterization  May 2014  . Percutaneous coronary stent intervention (pci-s)  April 27 2013  . Vein surgery       Home Meds: Prior to Admission medications   Medication Sig Start Date End Date Taking? Authorizing Provider  aspirin 81 MG tablet Take 81 mg by mouth daily.   Yes Historical Provider, MD  atorvastatin (LIPITOR) 40 MG tablet Take 1.5 tablets (60 mg total) by mouth daily. 03/18/14  Yes Tyler Breeding, MD  carvedilol (COREG) 6.25 MG tablet Take 6.25 mg by mouth 2 (two) times daily with a meal.   Yes Historical Provider, MD    cholecalciferol (VITAMIN D) 1000 UNITS tablet Take 1,000 Units by mouth daily.   Yes Historical Provider, MD  clopidogrel (PLAVIX) 75 MG tablet Take 75 mg by mouth daily with breakfast.   Yes Historical Provider, MD  diphenhydramine-acetaminophen (TYLENOL PM) 25-500 MG TABS Take 1 tablet by mouth at bedtime as needed (sleep/pain).   Yes Historical Provider, MD  fluticasone (FLONASE) 50 MCG/ACT nasal spray Place 1 spray into both nostrils daily. 06/30/14  Yes Historical Provider, MD  hydroxyurea (HYDREA) 500 MG capsule Take 1 capsule (500 mg total) by mouth 2 (two) times daily. 03/19/14  Yes Tyler Norway, MD  ibuprofen (ADVIL,MOTRIN) 200 MG tablet Take 400 mg by mouth every 6 (six) hours as needed for headache.   Yes Historical Provider, MD  Insulin Glargine (LANTUS SOLOSTAR) 100 UNIT/ML Solostar Pen Inject 10 Units into the skin 2 (two) times daily.   Yes Historical Provider, MD  isosorbide mononitrate (IMDUR) 30 MG 24 hr tablet Take 30 mg by mouth daily.   Yes Historical Provider, MD  Linagliptin-Metformin HCl 2.5-500 MG TABS Take 1 tablet by mouth 2 (two) times daily.   Yes Historical Provider, MD  lisinopril (PRINIVIL,ZESTRIL) 20 MG tablet Take 20 mg by mouth daily.   Yes Historical Provider, MD  metoCLOPramide (REGLAN) 10 MG tablet Take 1 tablet (10 mg total) by mouth every 8 (eight) hours as needed for nausea. 07/07/14  Yes Tyler Musca, MD  NITROSTAT 0.4 MG SL tablet Place 0.4 mg under the tongue every 5 (five) minutes as needed for chest pain.  10/09/13  Yes Historical Provider, MD  PROAIR HFA 108 (90 BASE) MCG/ACT inhaler Inhale 1 puff into the lungs every 4 (four) hours as needed for wheezing or shortness of breath.  12/03/13  Yes Historical Provider, MD    Inpatient Medications:  . antiseptic oral rinse  7 mL Mouth Rinse q12n4p  . aspirin EC  81 mg Oral Daily  . atorvastatin  60 mg Oral Daily  . carvedilol  6.25 mg Oral BID WC  .  ceFAZolin (ANCEF) IV  2 g Intravenous On Call  .  chlorhexidine  15 mL Mouth Rinse BID  . cholecalciferol  1,000 Units Oral Daily  . clopidogrel  75 mg Oral Daily  . enoxaparin (LOVENOX) injection  40 mg Subcutaneous Q24H  . fluticasone  1 spray Each Nare Daily  . gentamicin irrigation  80 mg Irrigation On Call  . hydroxyurea  500 mg Oral BID  . insulin aspart  0-15 Units Subcutaneous TID WC  . insulin aspart  0-5 Units Subcutaneous QHS  . insulin glargine  5 Units Subcutaneous QHS  . isosorbide mononitrate  30 mg Oral Daily  . levETIRAcetam  500 mg Intravenous Q12H  . lisinopril  20 mg Oral Daily  . potassium chloride  20 mEq Oral Once  . sodium chloride  3 mL Intravenous Q12H  . sodium chloride  3 mL Intravenous Q12H   . sodium chloride 50 mL/hr at 07/12/14 0840    Allergies: No Known Allergies  History   Social History  . Marital Status: Married    Spouse Name: N/A  Number of Children: N/A  . Years of Education: N/A   Occupational History  . Limo driver    Social History Main Topics  . Smoking status: Never Smoker   . Smokeless tobacco: Never Used  . Alcohol Use: No  . Drug Use: No  . Sexual Activity: Not on file   Other Topics Concern  . Not on file   Social History Narrative   Lives with wife.      Family History  Problem Relation Age of Onset  . Hyperlipidemia Maternal Grandmother   . Asthma Father   . CAD      Multiple siblings     Review of Systems: All other systems reviewed and are otherwise negative except as noted above.  Labs:  POC troponin negative  Lab Results  Component Value Date   WBC 12.5* 07/10/2014   HGB 14.6 07/10/2014   HCT 46.2 07/10/2014   MCV 79.7 07/10/2014   PLT 326 07/10/2014     Recent Labs Lab 07/09/14 2000 07/10/14 0600  NA 133* 143  K 4.7 3.4*  CL 95* 106  CO2 23 24  BUN 13 9  CREATININE 0.97 0.79  CALCIUM 9.1 8.7  PROT 6.6  --   BILITOT 1.0  --   ALKPHOS 121*  --   ALT 12  --   AST 14  --   GLUCOSE 604* 102*   Lab Results  Component Value Date    CHOL 94 07/11/2014   HDL 34* 07/11/2014   LDLCALC 40 07/11/2014   TRIG 101 07/11/2014    Radiology/Studies:  Dg Chest 1 View 07/09/2014   CLINICAL DATA:  Seizures, acute syncope, initial encounter.  EXAM: CHEST - 1 VIEW  COMPARISON:  10/27/2010.  FINDINGS: Trachea is midline. Heart size stable. Lungs are somewhat low in volume with linear scarring at the left lung base. No airspace consolidation. Thickening of the minor fissure. No pleural fluid.  IMPRESSION: No acute findings.   Electronically Signed   By: Lorin Picket M.D.   On: 07/09/2014 21:51   Ct Head Wo Contrast 07/09/2014   CLINICAL DATA:  Severe headache; confusion ; history of standing up after date air in the coming week in falling to the floor with seizure like activity ; the patient was incontinent of urine and agitated after seizure activity ; hyperglycemia at the seen  EXAM: CT HEAD WITHOUT CONTRAST  CT CERVICAL SPINE WITHOUT CONTRAST  TECHNIQUE: Multidetector CT imaging of the head and cervical spine was performed following the standard protocol without intravenous contrast. Multiplanar CT image reconstructions of the cervical spine were also generated.  COMPARISON:  Noncontrast CT scan of the brain dated Dated June 30, 2014  FINDINGS: CT HEAD FINDINGS  There is mild diffuse cerebral and cerebellar atrophy with compensatory ventriculomegaly. There is no intracranial hemorrhage nor intracranial mass effect. There is no acute ischemic change. The cerebellum and brainstem are unremarkable. The observed paranasal sinuses exhibit no air-fluid levels. There is no acute skull fracture.  CT CERVICAL SPINE FINDINGS  The cervical vertebral bodies are preserved in height. The prevertebral soft tissue spaces are normal. The intervertebral disc space heights are well maintained. There is no perched facet nor spinous process fracture. The odontoid is intact. The visualized portions of the pulmonary apices and the soft tissues of the neck are  unremarkable. There is mild prominence of the thyroid isthmus.  IMPRESSION: 1. There is no acute intracranial hemorrhage nor other acute intracranial abnormality. There are mild stable age  related atrophic changes. 2. There is no acute skull fracture. 3. There is no acute cervical spine fracture nor dislocation.   Electronically Signed   By: David  Martinique   On: 07/09/2014 21:40   Ct Head Wo Contrast 06/30/2014   CLINICAL DATA:  Headache for several days  EXAM: CT HEAD WITHOUT CONTRAST  TECHNIQUE: Contiguous axial images were obtained from the base of the skull through the vertex without intravenous contrast.  COMPARISON:  None.  FINDINGS: No acute intracranial hemorrhage. No focal mass lesion. No CT evidence of acute infarction. No midline shift or mass effect. No hydrocephalus. Basilar cisterns are patent. Mild periventricular white matter hypodensities. Paranasal sinuses and mastoid air cells are clear.  IMPRESSION: No acute intracranial findings. Mild white matter microvascular disease.   Electronically Signed   By: Suzy Bouchard M.D.   On: 06/30/2014 18:13   Ct Cervical Spine Wo Contrast 07/09/2014   CLINICAL DATA:  Severe headache; confusion ; history of standing up after date air in the coming week in falling to the floor with seizure like activity ; the patient was incontinent of urine and agitated after seizure activity ; hyperglycemia at the seen  EXAM: CT HEAD WITHOUT CONTRAST  CT CERVICAL SPINE WITHOUT CONTRAST  TECHNIQUE: Multidetector CT imaging of the head and cervical spine was performed following the standard protocol without intravenous contrast. Multiplanar CT image reconstructions of the cervical spine were also generated.  COMPARISON:  Noncontrast CT scan of the brain dated Dated June 30, 2014  FINDINGS: CT HEAD FINDINGS  There is mild diffuse cerebral and cerebellar atrophy with compensatory ventriculomegaly. There is no intracranial hemorrhage nor intracranial mass effect. There is  no acute ischemic change. The cerebellum and brainstem are unremarkable. The observed paranasal sinuses exhibit no air-fluid levels. There is no acute skull fracture.  CT CERVICAL SPINE FINDINGS  The cervical vertebral bodies are preserved in height. The prevertebral soft tissue spaces are normal. The intervertebral disc space heights are well maintained. There is no perched facet nor spinous process fracture. The odontoid is intact. The visualized portions of the pulmonary apices and the soft tissues of the neck are unremarkable. There is mild prominence of the thyroid isthmus.  IMPRESSION: 1. There is no acute intracranial hemorrhage nor other acute intracranial abnormality. There are mild stable age related atrophic changes. 2. There is no acute skull fracture. 3. There is no acute cervical spine fracture nor dislocation.   Electronically Signed   By: David  Martinique   On: 07/09/2014 21:40   Mr Jodene Nam Head Wo Contrast 07/10/2014   CLINICAL DATA:  Headache and confusion. Generalized seizure. Dense RIGHT homonymous hemianopia. Stroke risk factors include hypertension, diabetes, and hyperlipidemia. Patient is anticoagulated.  EXAM: MRI HEAD WITHOUT CONTRAST  MRA HEAD WITHOUT CONTRAST  MRA NECK WITHOUT CONTRAST  TECHNIQUE: Multiplanar, multiecho pulse sequences of the brain and surrounding structures were obtained without intravenous contrast. Angiographic images of the Circle of Willis were obtained using MRA technique without intravenous contrast. Angiographic images of the neck were obtained using MRA technique without intravenous contrast. Carotid stenosis measurements (when applicable) are obtained utilizing NASCET criteria, using the distal internal carotid diameter as the denominator.  COMPARISON:  CT head 07/09/2014.  FINDINGS: MRI HEAD FINDINGS  The patient was unable to remain motionless for the exam. Small or subtle lesions could be overlooked.  There is restricted diffusion primarily involving the LEFT  occipital cortex, with minimal underlying white matter involvement, sparing the brainstem, cerebellum, and thalamus consistent with  a distal LEFT PCA thrombotic event. No other areas of acute infarction are identified. Generalized atrophy with chronic microvascular ischemic change. No acute or chronic blood products. 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  Dysconjugate gaze. Partial empty sella. No acute sinus or mastoid disease. Compared with recent CT, the infarct is not visible.  MRA HEAD FINDINGS  Dolichoectatic cerebral vasculature. Moderate irregularity of the cavernous and supraclinoid ICA segments suspected flow reducing stenoses of 75% involving the RIGHT supraclinoid ICA and LEFT inferior cavernous ICA. Basilar artery widely patent with LEFT vertebral dominant. Moderately diseased LEFT vertebral ends in PICA.  RIGHT A1 ACA is dominant. Mildly irregular but non stenotic LEFT ACA. 50% proximal stenosis with diffuse irregularity of the RIGHT M1 MCA. Diffuse irregularity with suspected 75% stenosis LEFT distal M1 MCA.  Mildly irregular BILATERAL PCA vessels without proximal stenosis. Distal ramifications of the MCA and PCA vessels are poorly visualized. No definite cerebellar branch occlusion.  MRA NECK FINDINGS  Noncontrast extracranial MRA demonstrates grossly patent carotid bifurcations bilaterally without obvious flow reducing lesion. No carotid dissection is evident. The RIGHT vertebral is widely patent through the neck and is the dominant vessel. LEFT vertebral is suboptimally evaluated due to its small size.  IMPRESSION: Acute LEFT occipital cortical infarct, correlating with dense RIGHT homonymous hemianopia.  Generalized atrophy with chronic microvascular ischemic change.  Widespread intracranial atherosclerotic disease as described, without proximal flow reducing lesion of the dominant RIGHT vertebral,  basilar, or either proximal PCA.  Unremarkable noncontrast extracranial MRA, without flow reducing lesion of the carotid or dominant RIGHT vertebral.   Electronically Signed   By: Rolla Flatten M.D.   On: 07/10/2014 17:58   Mr Angiogram Neck Wo Contrast 07/10/2014   CLINICAL DATA:  Headache and confusion. Generalized seizure. Dense RIGHT homonymous hemianopia. Stroke risk factors include hypertension, diabetes, and hyperlipidemia. Patient is anticoagulated.  EXAM: MRI HEAD WITHOUT CONTRAST  MRA HEAD WITHOUT CONTRAST  MRA NECK WITHOUT CONTRAST  TECHNIQUE: Multiplanar, multiecho pulse sequences of the brain and surrounding structures were obtained without intravenous contrast. Angiographic images of the Circle of Willis were obtained using MRA technique without intravenous contrast. Angiographic images of the neck were obtained using MRA technique without intravenous contrast. Carotid stenosis measurements (when applicable) are obtained utilizing NASCET criteria, using the distal internal carotid diameter as the denominator.  COMPARISON:  CT head 07/09/2014.  FINDINGS: MRI HEAD FINDINGS  The patient was unable to remain motionless for the exam. Small or subtle lesions could be overlooked.  There is restricted diffusion primarily involving the LEFT occipital cortex, with minimal underlying white matter involvement, sparing the brainstem, cerebellum, and thalamus consistent with a distal LEFT PCA thrombotic event. No other areas of acute infarction are identified. Generalized atrophy with chronic microvascular ischemic change. No acute or chronic blood products. 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  Dysconjugate gaze. Partial empty sella. No acute sinus or mastoid disease. Compared with recent CT, the infarct is not visible.  MRA HEAD FINDINGS  Dolichoectatic cerebral vasculature. Moderate irregularity of the cavernous and  supraclinoid ICA segments suspected flow reducing stenoses of 75% involving the RIGHT supraclinoid ICA and LEFT inferior cavernous ICA. Basilar artery widely patent with LEFT vertebral dominant. Moderately diseased LEFT vertebral ends in PICA.  RIGHT A1 ACA is dominant. Mildly irregular but non stenotic LEFT ACA. 50% proximal stenosis with diffuse irregularity  of the RIGHT M1 MCA. Diffuse irregularity with suspected 75% stenosis LEFT distal M1 MCA.  Mildly irregular BILATERAL PCA vessels without proximal stenosis. Distal ramifications of the MCA and PCA vessels are poorly visualized. No definite cerebellar branch occlusion.  MRA NECK FINDINGS  Noncontrast extracranial MRA demonstrates grossly patent carotid bifurcations bilaterally without obvious flow reducing lesion. No carotid dissection is evident. The RIGHT vertebral is widely patent through the neck and is the dominant vessel. LEFT vertebral is suboptimally evaluated due to its small size.  IMPRESSION: Acute LEFT occipital cortical infarct, correlating with dense RIGHT homonymous hemianopia.  Generalized atrophy with chronic microvascular ischemic change.  Widespread intracranial atherosclerotic disease as described, without proximal flow reducing lesion of the dominant RIGHT vertebral, basilar, or either proximal PCA.  Unremarkable noncontrast extracranial MRA, without flow reducing lesion of the carotid or dominant RIGHT vertebral.   Electronically Signed   By: Rolla Flatten M.D.   On: 07/10/2014 17:58   Mr Brain Wo Contrast 07/10/2014   CLINICAL DATA:  Headache and confusion. Generalized seizure. Dense RIGHT homonymous hemianopia. Stroke risk factors include hypertension, diabetes, and hyperlipidemia. Patient is anticoagulated.  EXAM: MRI HEAD WITHOUT CONTRAST  MRA HEAD WITHOUT CONTRAST  MRA NECK WITHOUT CONTRAST  TECHNIQUE: Multiplanar, multiecho pulse sequences of the brain and surrounding structures were obtained without intravenous contrast.  Angiographic images of the Circle of Willis were obtained using MRA technique without intravenous contrast. Angiographic images of the neck were obtained using MRA technique without intravenous contrast. Carotid stenosis measurements (when applicable) are obtained utilizing NASCET criteria, using the distal internal carotid diameter as the denominator.  COMPARISON:  CT head 07/09/2014.  FINDINGS: MRI HEAD FINDINGS  The patient was unable to remain motionless for the exam. Small or subtle lesions could be overlooked.  There is restricted diffusion primarily involving the LEFT occipital cortex, with minimal underlying white matter involvement, sparing the brainstem, cerebellum, and thalamus consistent with a distal LEFT PCA thrombotic event. No other areas of acute infarction are identified. Generalized atrophy with chronic microvascular ischemic change. No acute or chronic blood products. 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  Dysconjugate gaze. Partial empty sella. No acute sinus or mastoid disease. Compared with recent CT, the infarct is not visible.  MRA HEAD FINDINGS  Dolichoectatic cerebral vasculature. Moderate irregularity of the cavernous and supraclinoid ICA segments suspected flow reducing stenoses of 75% involving the RIGHT supraclinoid ICA and LEFT inferior cavernous ICA. Basilar artery widely patent with LEFT vertebral dominant. Moderately diseased LEFT vertebral ends in PICA.  RIGHT A1 ACA is dominant. Mildly irregular but non stenotic LEFT ACA. 50% proximal stenosis with diffuse irregularity of the RIGHT M1 MCA. Diffuse irregularity with suspected 75% stenosis LEFT distal M1 MCA.  Mildly irregular BILATERAL PCA vessels without proximal stenosis. Distal ramifications of the MCA and PCA vessels are poorly visualized. No definite cerebellar branch occlusion.  MRA NECK FINDINGS  Noncontrast extracranial MRA  demonstrates grossly patent carotid bifurcations bilaterally without obvious flow reducing lesion. No carotid dissection is evident. The RIGHT vertebral is widely patent through the neck and is the dominant vessel. LEFT vertebral is suboptimally evaluated due to its small size.  IMPRESSION: Acute LEFT occipital cortical infarct, correlating with dense RIGHT homonymous hemianopia.  Generalized atrophy with chronic microvascular ischemic change.  Widespread intracranial atherosclerotic disease as described, without proximal flow reducing lesion of the dominant RIGHT vertebral, basilar, or either proximal PCA.  Unremarkable noncontrast extracranial  MRA, without flow reducing lesion of the carotid or dominant RIGHT vertebral.   Electronically Signed   By: Rolla Flatten M.D.   On: 07/10/2014 17:58   EKG: sinus tachycardia RBBB 111bpm, old inferior infarct, probable LAE, nonspecific St-T changes  Physical Exam: Blood pressure 133/80, pulse 67, temperature 97.7 F (36.5 C), temperature source Oral, resp. rate 20, height 5\' 7"  (1.702 m), weight 186 lb (84.369 kg), SpO2 97.00%. General: Well developed, well nourished AAM in no acute distress. Head: Normocephalic, atraumatic, sclera non-icteric, no xanthomas, nares are without discharge.  Neck: Negative for carotid bruits. JVD not elevated. Lungs: Clear bilaterally to auscultation without wheezes, rales, or rhonchi. Breathing is unlabored. Heart: RRR with S1 S2. No murmurs, rubs, or gallops appreciated. Well healed old sternal scar Abdomen: Soft, non-tender, non-distended with normoactive bowel sounds. No hepatomegaly. No rebound/guarding. No obvious abdominal masses. Msk:  Strength and tone appear normal for age. Extremities: No clubbing or cyanosis. No edema.  Distal pedal pulses are 2+ and equal bilaterally. Neuro: Alert and oriented X 3. No facial asymmetry. Moves all extremities spontaneously and follows commands. Psych:  Responds to questions  appropriately with a normal affect.   Assessment and Plan:   1. Acute stroke 2. CAD s/p CABG 2000, PCI 2014, on ASA/Plavix prior to and during admission 3. Uncontrolled diabetes mellitus with vascular complications 4. HTN 5. Hyperlipidemia, on statin 6. Hypokalemia  We have been consulted for TEE/loop recorder insertion to evaluate for cryptogenic stroke. TEE is scheduled for 10am and orders have already been written. I explained risks and benefits of loop insertion with patient and he is in agreement with plan. If TEE is unrevealing, plan to proceed with ILR today. Will also give 22meq of KCl given K of 3.4 - further management of lytes per primary team.   Signed, Melina Copa PA-C 07/12/2014, 8:41 AM   EP Attending  Patient seen and examined. I concur with the findings as documented above. The patient's TEE demonstrated no explanation for his stroke. We have recommended insertion of an implantable loop recorder. The risk and benefits of the procedure have been discussed with the patient, and he wishes to proceed.  Cristopher Peru, M.D.

## 2014-07-12 NOTE — Progress Notes (Signed)
EEG Completed; Results Pending  

## 2014-07-12 NOTE — Progress Notes (Signed)
[facility] PROGRESS NOTE  Tyler Whitehead OXB:[id number] DOB: November 17, 1940 DOA: 07/09/2014 PCP: [name], MD  Assessment/Plan: Principal Problem:   New onset seizure - Neurology managing - Pt currently on Keppra    Cerebral thrombosis with cerebral infarction - Neurology managing workup. - MRI reports acute left occipital cortical infarct correlating with dense right homonymous hemianopia - pt is s/p loop recorder placement and TEE on 07/12/14  Active Problems:   CAD (coronary artery disease) - pt on aspirin and plavix - also on statin - stable no chest pain reported.    Dyslipidemia - stable on statin    Diabetic hyperosmolar non-ketotic state/DM2 - Pt is currently on SQ insulin and blood sugars relatively well controlled - Heart healthy diet.    Uncontrolled hypertension - well controlled currently - continue current regimen as neurology has not recommended discontinuing coreg, lisinopril, and imdur, and allow for permissive hypertension    Visual field defect - secondary to new onset stroke  Code Status: full Family Communication: d/c pt and daughters at bedside Disposition Plan: Most likely d/c next am 07/13/14.   Consultants:  Neurology  Procedures:  MRI/MRA of head  CT of cervical spine and head  MR angiogram of neck w/o contrast  Antibiotics:  None  HPI/Subjective: No new complaints currently, no reports of seizure like activity. Pt has just arrived from TEE.  Objective: Filed Vitals:   07/12/14 1446  BP: 130/71  Pulse: 68  Temp: 97.7 F (36.5 C)  Resp: 20    Intake/Output Summary (Last 24 hours) at 07/12/14 1602 Last data filed at 07/12/14 1454  Gross per 24 hour  Intake    240 ml  Output      0 ml  Net    240 ml   Filed Weights   07/09/14 2233 07/10/14 0201 07/10/14 1756  Weight: 81.647 kg (180 lb) 81.8 kg (180 lb 5.4 oz) 84.369 kg (186 lb)    Exam:   General:  Pt in nad, alert and awake  Cardiovascular: no  cyanosis, warm extremities  Respiratory: equal chest rise, no wheezes  Abdomen: soft, Nt, nd  Musculoskeletal: no cyanosis or clubbing   Data Reviewed: Basic Metabolic Panel:  Recent Labs Lab 07/07/14 1539 07/09/14 2000 07/10/14 0600  NA 132* 133* 143  K 4.3 4.7 3.4*  CL 93* 95* 106  CO2 24 23 24   GLUCOSE 453* 604* 102*  BUN 13 13 9   CREATININE 0.97 0.97 0.79  CALCIUM 9.6 9.1 8.7   Liver Function Tests:  Recent Labs Lab 07/07/14 1539 07/09/14 2000  AST 15 14  ALT 14 12  ALKPHOS 106 121*  BILITOT 1.3* 1.0  PROT 6.5 6.6  ALBUMIN 3.6 3.5   No results found for this basename: LIPASE, AMYLASE,  in the last 168 hours No results found for this basename: AMMONIA,  in the last 168 hours CBC:  Recent Labs Lab 07/07/14 1539 07/09/14 2000 07/10/14 0600  WBC 12.5* 12.4* 12.5*  NEUTROABS 10.6* 11.5*  --   HGB 17.6* 16.2 14.6  HCT 53.6* 49.7 46.2  MCV 80.4 80.6 79.7  PLT 433* 331 326   Cardiac Enzymes: No results found for this basename: CKTOTAL, CKMB, CKMBINDEX, TROPONINI,  in the last 168 hours BNP (last 3 results) No results found for this basename: PROBNP,  in the last 8760 hours CBG:  Recent Labs Lab 07/11/14 0626 07/11/14 1142 07/11/14 1620 07/11/14 2121 07/12/14 0619  GLUCAP 220* 185* 245* 295* 276*    No results  found for this or any previous visit (from the past 240 hour(s)).   Studies: Mr Virgel Paling Wo Contrast  07/10/2014   CLINICAL DATA:  Headache and confusion. Generalized seizure. Dense RIGHT homonymous hemianopia. Stroke risk factors include hypertension, diabetes, and hyperlipidemia. Patient is anticoagulated.  EXAM: MRI HEAD WITHOUT CONTRAST  MRA HEAD WITHOUT CONTRAST  MRA NECK WITHOUT CONTRAST  TECHNIQUE: Multiplanar, multiecho pulse sequences of the brain and surrounding structures were obtained without intravenous contrast. Angiographic images of the Circle of Willis were obtained using MRA technique without intravenous contrast.  Angiographic images of the neck were obtained using MRA technique without intravenous contrast. Carotid stenosis measurements (when applicable) are obtained utilizing NASCET criteria, using the distal internal carotid diameter as the denominator.  COMPARISON:  CT head 07/09/2014.  FINDINGS: MRI HEAD FINDINGS  The patient was unable to remain motionless for the exam. Small or subtle lesions could be overlooked.  There is restricted diffusion primarily involving the LEFT occipital cortex, with minimal underlying white matter involvement, sparing the brainstem, cerebellum, and thalamus consistent with a distal LEFT PCA thrombotic event. No other areas of acute infarction are identified. Generalized atrophy with chronic microvascular ischemic change. No acute or chronic blood products. 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  Dysconjugate gaze. Partial empty sella. No acute sinus or mastoid disease. Compared with recent CT, the infarct is not visible.  MRA HEAD FINDINGS  Dolichoectatic cerebral vasculature. Moderate irregularity of the cavernous and supraclinoid ICA segments suspected flow reducing stenoses of 75% involving the RIGHT supraclinoid ICA and LEFT inferior cavernous ICA. Basilar artery widely patent with LEFT vertebral dominant. Moderately diseased LEFT vertebral ends in PICA.  RIGHT A1 ACA is dominant. Mildly irregular but non stenotic LEFT ACA. 50% proximal stenosis with diffuse irregularity of the RIGHT M1 MCA. Diffuse irregularity with suspected 75% stenosis LEFT distal M1 MCA.  Mildly irregular BILATERAL PCA vessels without proximal stenosis. Distal ramifications of the MCA and PCA vessels are poorly visualized. No definite cerebellar branch occlusion.  MRA NECK FINDINGS  Noncontrast extracranial MRA demonstrates grossly patent carotid bifurcations bilaterally without obvious flow reducing lesion. No carotid  dissection is evident. The RIGHT vertebral is widely patent through the neck and is the dominant vessel. LEFT vertebral is suboptimally evaluated due to its small size.  IMPRESSION: Acute LEFT occipital cortical infarct, correlating with dense RIGHT homonymous hemianopia.  Generalized atrophy with chronic microvascular ischemic change.  Widespread intracranial atherosclerotic disease as described, without proximal flow reducing lesion of the dominant RIGHT vertebral, basilar, or either proximal PCA.  Unremarkable noncontrast extracranial MRA, without flow reducing lesion of the carotid or dominant RIGHT vertebral.   Electronically Signed   By: Rolla Flatten M.D.   On: 07/10/2014 17:58   Mr Angiogram Neck Wo Contrast  07/10/2014   CLINICAL DATA:  Headache and confusion. Generalized seizure. Dense RIGHT homonymous hemianopia. Stroke risk factors include hypertension, diabetes, and hyperlipidemia. Patient is anticoagulated.  EXAM: MRI HEAD WITHOUT CONTRAST  MRA HEAD WITHOUT CONTRAST  MRA NECK WITHOUT CONTRAST  TECHNIQUE: Multiplanar, multiecho pulse sequences of the brain and surrounding structures were obtained without intravenous contrast. Angiographic images of the Circle of Willis were obtained using MRA technique without intravenous contrast. Angiographic images of the neck were obtained using MRA technique without intravenous contrast. Carotid stenosis measurements (when applicable) are obtained utilizing NASCET criteria, using the distal internal carotid diameter as the denominator.  COMPARISON:  CT head 07/09/2014.  FINDINGS: MRI HEAD FINDINGS  The patient was unable to remain motionless for the exam. Small or subtle lesions could be overlooked.  There is restricted diffusion primarily involving the LEFT occipital cortex, with minimal underlying white matter involvement, sparing the brainstem, cerebellum, and thalamus consistent with a distal LEFT PCA thrombotic event. No other areas of acute infarction are  identified. Generalized atrophy with chronic microvascular ischemic change. No acute or chronic blood products. 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  Dysconjugate gaze. Partial empty sella. No acute sinus or mastoid disease. Compared with recent CT, the infarct is not visible.  MRA HEAD FINDINGS  Dolichoectatic cerebral vasculature. Moderate irregularity of the cavernous and supraclinoid ICA segments suspected flow reducing stenoses of 75% involving the RIGHT supraclinoid ICA and LEFT inferior cavernous ICA. Basilar artery widely patent with LEFT vertebral dominant. Moderately diseased LEFT vertebral ends in PICA.  RIGHT A1 ACA is dominant. Mildly irregular but non stenotic LEFT ACA. 50% proximal stenosis with diffuse irregularity of the RIGHT M1 MCA. Diffuse irregularity with suspected 75% stenosis LEFT distal M1 MCA.  Mildly irregular BILATERAL PCA vessels without proximal stenosis. Distal ramifications of the MCA and PCA vessels are poorly visualized. No definite cerebellar branch occlusion.  MRA NECK FINDINGS  Noncontrast extracranial MRA demonstrates grossly patent carotid bifurcations bilaterally without obvious flow reducing lesion. No carotid dissection is evident. The RIGHT vertebral is widely patent through the neck and is the dominant vessel. LEFT vertebral is suboptimally evaluated due to its small size.  IMPRESSION: Acute LEFT occipital cortical infarct, correlating with dense RIGHT homonymous hemianopia.  Generalized atrophy with chronic microvascular ischemic change.  Widespread intracranial atherosclerotic disease as described, without proximal flow reducing lesion of the dominant RIGHT vertebral, basilar, or either proximal PCA.  Unremarkable noncontrast extracranial MRA, without flow reducing lesion of the carotid or dominant RIGHT vertebral.   Electronically Signed   By: Rolla Flatten M.D.   On:  07/10/2014 17:58   Mr Brain Wo Contrast  07/10/2014   CLINICAL DATA:  Headache and confusion. Generalized seizure. Dense RIGHT homonymous hemianopia. Stroke risk factors include hypertension, diabetes, and hyperlipidemia. Patient is anticoagulated.  EXAM: MRI HEAD WITHOUT CONTRAST  MRA HEAD WITHOUT CONTRAST  MRA NECK WITHOUT CONTRAST  TECHNIQUE: Multiplanar, multiecho pulse sequences of the brain and surrounding structures were obtained without intravenous contrast. Angiographic images of the Circle of Willis were obtained using MRA technique without intravenous contrast. Angiographic images of the neck were obtained using MRA technique without intravenous contrast. Carotid stenosis measurements (when applicable) are obtained utilizing NASCET criteria, using the distal internal carotid diameter as the denominator.  COMPARISON:  CT head 07/09/2014.  FINDINGS: MRI HEAD FINDINGS  The patient was unable to remain motionless for the exam. Small or subtle lesions could be overlooked.  There is restricted diffusion primarily involving the LEFT occipital cortex, with minimal underlying white matter involvement, sparing the brainstem, cerebellum, and thalamus consistent with a distal LEFT PCA thrombotic event. No other areas of acute infarction are identified. Generalized atrophy with chronic microvascular ischemic change. No acute or chronic blood products. 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  Dysconjugate gaze. Partial empty sella. No acute sinus or mastoid disease. Compared with recent CT, the infarct is not visible.  MRA HEAD FINDINGS  Dolichoectatic cerebral vasculature. Moderate irregularity of the cavernous and supraclinoid ICA segments suspected flow reducing  stenoses of 75% involving the RIGHT supraclinoid ICA and LEFT inferior cavernous ICA. Basilar artery widely patent with LEFT vertebral dominant. Moderately  diseased LEFT vertebral ends in PICA.  RIGHT A1 ACA is dominant. Mildly irregular but non stenotic LEFT ACA. 50% proximal stenosis with diffuse irregularity of the RIGHT M1 MCA. Diffuse irregularity with suspected 75% stenosis LEFT distal M1 MCA.  Mildly irregular BILATERAL PCA vessels without proximal stenosis. Distal ramifications of the MCA and PCA vessels are poorly visualized. No definite cerebellar branch occlusion.  MRA NECK FINDINGS  Noncontrast extracranial MRA demonstrates grossly patent carotid bifurcations bilaterally without obvious flow reducing lesion. No carotid dissection is evident. The RIGHT vertebral is widely patent through the neck and is the dominant vessel. LEFT vertebral is suboptimally evaluated due to its small size.  IMPRESSION: Acute LEFT occipital cortical infarct, correlating with dense RIGHT homonymous hemianopia.  Generalized atrophy with chronic microvascular ischemic change.  Widespread intracranial atherosclerotic disease as described, without proximal flow reducing lesion of the dominant RIGHT vertebral, basilar, or either proximal PCA.  Unremarkable noncontrast extracranial MRA, without flow reducing lesion of the carotid or dominant RIGHT vertebral.   Electronically Signed   By: Rolla Flatten M.D.   On: 07/10/2014 17:58    Scheduled Meds: . antiseptic oral rinse  7 mL Mouth Rinse q12n4p  . aspirin EC  81 mg Oral Daily  . atorvastatin  60 mg Oral Daily  . carvedilol  6.25 mg Oral BID WC  . chlorhexidine  15 mL Mouth Rinse BID  . cholecalciferol  1,000 Units Oral Daily  . clopidogrel  75 mg Oral Daily  . enoxaparin (LOVENOX) injection  40 mg Subcutaneous Q24H  . fluticasone  1 spray Each Nare Daily  . hydroxyurea  500 mg Oral BID  . insulin aspart  0-15 Units Subcutaneous TID WC  . insulin aspart  0-5 Units Subcutaneous QHS  . insulin glargine  5 Units Subcutaneous QHS  . isosorbide mononitrate  30 mg Oral Daily  . levETIRAcetam  500 mg Intravenous Q12H  .  lisinopril  20 mg Oral Daily  . sodium chloride  3 mL Intravenous Q12H  . sodium chloride  3 mL Intravenous Q12H   Continuous Infusions:     Time spent: > 35 minutes    Velvet Bathe  [facility] Pager [phone] If 7PM-7AM, please contact night-coverage at [email or web address], password [facility] 07/12/2014, 4:02 PM  LOS: 3 days

## 2014-07-12 NOTE — Evaluation (Signed)
Speech Language Pathology Evaluation Patient Details Name: Tyler Whitehead MRN: [id number] DOB: 1941-06-11 Today's Date: 07/12/2014 Time: 5573-2202 SLP Time Calculation (min): 23 min  Problem List:  Patient Active Problem List   Diagnosis Date Noted  . Cerebral thrombosis with cerebral infarction 07/10/2014  . New onset seizure 07/09/2014  . Diabetic hyperosmolar non-ketotic state 07/09/2014  . Uncontrolled hypertension 07/09/2014  . Polycythemia vera 07/09/2014  . Seizure 07/09/2014  . Visual field defect 07/09/2014  . Headache 06/30/2014  . HTN (hypertension) 11/23/2013  . Dyslipidemia 11/23/2013  . Myelofibrosis 11/22/2013  . Diverticular disease 11/22/2013  . Hemorrhoids 11/22/2013  . 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 CAD (coronary artery disease) 11/22/2013  . Peripheral vascular disease 11/22/2013  . DM2 (diabetes mellitus, type 2) 11/22/2013  . Renal cyst, left 11/22/2013  . Splenomegaly 11/22/2013   Past Medical History:  Past Medical History  Diagnosis Date  . Primary myelofibrosis   . Diverticular disease   . Hemorrhoid   . CAD (coronary artery disease)     a. S/p 3V CABG 2000 with LIMA-LAD, left radial-PDA, SVG-OM1. b. 2014: stent to SVG-OM1; then found to have obstruction distal to LAD anastamosis of LIMA s/p DES.   Marland Kitchen Peripheral vascular disease     a. R femoropopliteal bypass ~2012 in [place], reportedly shown to be occluded. Now followed by Dr. [name].  . DM (diabetes mellitus)   . Hyperlipidemia   . HTN (hypertension)   . RBBB    Past Surgical History:  Past Surgical History  Procedure Laterality Date  . Coronary artery bypass graft  2000    LM occluded, LIMA to LAD patent but distal native LAD disease, saphenous vein bypass graft to OM with 95% stenosis in the midportion, right coronary artery occluded, radial artery graft to right coronary artery patent but was diffusely small. Marginal graft stented. 7 2014. Of note he had multiple previous stents in the right  coronary artery. The distal LAD was treated with angioplasty. I  . Cardiac catheterization  May 2014  . Percutaneous coronary stent intervention (pci-s)  April 27 2013  . Vein surgery     HPI:  Tyler Whitehead is a 73 y.o. male admitted 07/09/14 following a witnessed generalized seizure. Pt c/o visual changes prior to losing consciousness. 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 PMH of DM, HTN, hyperlipidemia, PVD, and CAD.    Assessment / Plan / Recommendation Clinical Impression  Orders received and cognitive-linguistic evaluation completed.  Patient demonstrates intact speech and language abilities as well as cognition.  Patient was able to recall hospital course, current deficits and anticipate changes that will need to be made following discharge indicating appropriate safety awareness.  As a result, no skilled SLP services are warranted at this time.     SLP Assessment  Patient does not need any further Speech Lanaguage Pathology Services    Follow Up Recommendations  Other (comment);None            Pertinent Vitals/Pain Pain Assessment: No/denies pain Pain Score: 0-No pain   SLP Goals  Patient/Family Stated Goal: to see better  SLP Evaluation Prior Functioning  Cognitive/Linguistic Baseline: Within functional limits Type of Home: House  Lives With: Spouse Available Help at Discharge: Family;Available 24 hours/day Vocation: Part time employment   Cognition  Overall Cognitive Status: Within Functional Limits for tasks assessed Arousal/Alertness: Awake/alert Orientation Level: Oriented X4 Attention: Selective Selective Attention: Appears intact Memory: Appears intact Awareness: Appears intact  Problem Solving: Appears intact Safety/Judgment: Appears intact    Comprehension  Auditory Comprehension Overall Auditory Comprehension: Appears within functional limits for tasks assessed Visual  Recognition/Discrimination Discrimination: Exceptions to [facility] Reading Comprehension Reading Status: Not tested    Expression Expression Primary Mode of Expression: Verbal Verbal Expression Overall Verbal Expression: Appears within functional limits for tasks assessed Written Expression Written Expression: Not tested   Oral / Motor Oral Motor/Sensory Function Overall Oral Motor/Sensory Function: Appears within functional limits for tasks assessed Motor Speech Overall Motor Speech: Appears within functional limits for tasks assessed   GO    Carmelia Roller., CCC-SLP [phone]  Lonnie Reth 07/12/2014, 4:34 PM

## 2014-07-13 ENCOUNTER — Encounter (HOSPITAL_COMMUNITY): Payer: Self-pay | Admitting: Internal Medicine

## 2014-07-13 ENCOUNTER — Telehealth: Payer: Self-pay | Admitting: Hematology

## 2014-07-13 ENCOUNTER — Other Ambulatory Visit: Payer: Medicare Other

## 2014-07-13 DIAGNOSIS — I6330 Cerebral infarction due to thrombosis of unspecified cerebral artery: Secondary | ICD-10-CM

## 2014-07-13 LAB — GLUCOSE, CAPILLARY
Glucose-Capillary: 256 mg/dL — ABNORMAL HIGH (ref 70–99)
Glucose-Capillary: 227 mg/dL — ABNORMAL HIGH (ref 70–99)
Glucose-Capillary: 308 mg/dL — ABNORMAL HIGH (ref 70–99)

## 2014-07-13 LAB — BASIC METABOLIC PANEL
Anion gap: 10 (ref 5–15)
BUN: 9 mg/dL (ref 6–23)
CO2: 27 mEq/L (ref 19–32)
Calcium: 8.8 mg/dL (ref 8.4–10.5)
Chloride: 101 mEq/L (ref 96–112)
Creatinine, Ser: 0.96 mg/dL (ref 0.50–1.35)
GFR calc Af Amer: >90 mL/min (ref >90)
GFR calc non Af Amer: 81 mL/min — ABNORMAL LOW (ref >90)
Glucose, Bld: 244 mg/dL — ABNORMAL HIGH (ref 70–99)
Potassium: 4.3 mEq/L (ref 3.7–5.3)
Sodium: 138 mEq/L (ref 137–147)

## 2014-07-13 MED ORDER — LEVETIRACETAM 500 MG PO TABS: 500.0000 mg | ORAL_TABLET | Freq: Two times a day (BID) | ORAL | Status: DC

## 2014-07-13 NOTE — Progress Notes (Signed)
Pt discharged at this time with family alert, verbal with no complaints voiced. Denies pain or discomfort. IV discontinued dry dressing. Discharge instructions and prescription given to pt with verbal understanding. Pt received a copy of follow up appts. Prior to leaving, pt watched video on cardiac monitoring with equipment to use at home. No noted distress.

## 2014-07-13 NOTE — Progress Notes (Signed)
Inpatient Diabetes Program Recommendations  AACE/ADA: New Consensus Statement on Inpatient Glycemic Control (2013)  Target Ranges:  Prepandial:   less than 140 mg/dL      Peak postprandial:   less than 180 mg/dL (1-2 hours)      Critically ill patients:  140 - 180 mg/dL   Diabetes history: Type 2 Diabetes- patient admitted with CBG> 600 mg/dL. Note that A1C=12.9% indicating poor glycemic control.  Outpatient Diabetes medications: Lantus 10 units bid  Current orders for Inpatient glycemic control:  Lantus 5 units q HS, Novolog moderate tid with meals and HS  Please increase Lantus to 10 units bid- This was home dose. Will likely need adjustment based on elevated A1C.   Thank you, Rosita Kea, RN, CNS, Diabetes Coordinator [phone])     Diabetes history: Type 2 Diabetes- patient admitted with CBG> 600 mg/dL. Note that A1C=12.9% indicating poor glycemic control.  Outpatient Diabetes medications: Lantus 10 units bid  Current orders for Inpatient glycemic control:  Lantus 5 units q HS, Novolog moderate tid with meals and HS  Please increase Lantus to 10 units bid- This was home dose. Will likely need adjustment based on elevated A1C.

## 2014-07-13 NOTE — Progress Notes (Signed)
Talked to patient about DCP/ [place] choices, patient requested [facility] for HHPT/ OT; Attending MD at discharge please order HHPT/OT at discharge; [facility] made aware of referralAneta Mins [phone]

## 2014-07-13 NOTE — Telephone Encounter (Signed)
[facility] social worker called to cx pt appt due to being in the hospital

## 2014-07-13 NOTE — Progress Notes (Signed)
Occupational Therapy Treatment Patient Details Name: Tyler Whitehead MRN: [id number] DOB: [birth year] Today's Date: 07/13/2014    History of present illness Tyler Whitehead is a 73 y.o. male admitted 07/09/14 following a witnessed generalized seizure. Pt c/o visual changes prior to losing consciousness. 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 PMH of DM, HTN, hyperlipidemia, PVD, and CAD.    OT comments  Pt admitted for seizure and vision changes resulting in RT homonymous hemianopsia. Pt lives at home and was independent with ADLs. Pt reported to OT that his vision has changed within past 2 weeks and pt was aware of deficits. Pt demonstrated improved mobility and performed well on DGI (score 21). Pt demonstrated midline visual difficulties during letter cancellation and number search tasks, however showed use of compensatory head movements. Pt is overall min guard for mobility, however still concerned about pt's vision and impulsivity. Pt will benefit from acute OT to address vision and to promote independence with ADLs.    Follow Up Recommendations  Outpatient OT;Supervision/Assistance - 24 hour    Equipment Recommendations  None recommended by OT    Recommendations for Other Services      Precautions / Restrictions Precautions Precautions: Fall Restrictions Weight Bearing Restrictions: No       Mobility Bed Mobility               General bed mobility comments: Pt up in recliner before/after OT session.  Transfers Overall transfer level: Independent Equipment used: None Transfers: Sit to/from Stand Sit to Stand: Min guard         General transfer comment: Pt attempted to stand before prompted to    Balance Overall balance assessment: Independent Sitting-balance support: No upper extremity supported;Feet supported       Standing balance support: No upper extremity supported;During functional  activity Standing balance-Leahy Scale: Good                     ADL                                                Vision                 Additional Comments: Pt completed letter cancellation task and number search that had a cognitive component having pt locate a number 3 times and cross them out. Pt had difficulty locating numbers and letters located at midline and compensated by turning head RT and LT to see symbols in middle. Pt stated "today I'm seein' the whole tv, but yesterday I was only seein' half of it." Pt stated that he is nolonger seeing "boxes of differentcolors." Pt states he is still seeing "things shooting across like rain or something" and reports that it originates on RT side and moves toward LT.    Perception     Praxis      Cognition   Behavior During Therapy: WFL for tasks assessed/performed Overall Cognitive Status: Within Functional Limits for tasks assessed Area of Impairment: Following commands   Current Attention Level: Selective    Following Commands: Follows multi-step commands inconsistently   Awareness: Emergent   General Comments: Pt aware of vision deficits    Extremity/Trunk Assessment               Exercises     Shoulder  Instructions       General Comments      Pertinent Vitals/ Pain       Pain Assessment: 0-10 Pain Score: 2  Pain Location: RT leg Pain Descriptors / Indicators: Sore Pain Intervention(s): Monitored during session;Repositioned  Home Living                                          Prior Functioning/Environment              Frequency Min 2X/week     Progress Toward Goals  OT Goals(current goals can now be found in the care plan section)     Acute Rehab OT Goals Patient Stated Goal: to get home soon ADL Goals Additional ADL Goal #1: Pt will verbalize visual scanning strategy independently for increased safety with ADLs.  Additional ADL Goal  #2: Pt will demonstrate visual scanning during functional mobility to promote safety and identify hazards.   Plan      Co-evaluation                 End of Session Equipment Utilized During Treatment: Gait belt   Activity Tolerance Patient tolerated treatment well   Patient Left in chair;with call bell/phone within reach   Nurse Communication          Time: 1359-1426 OT Time Calculation (min): 27 min  Charges:    Redmond Baseman 07/13/2014, 3:08 PM

## 2014-07-13 NOTE — Discharge Summary (Signed)
Physician Discharge Summary  Tyler Whitehead IWP:[id number] DOB: November 12, 1940 DOA: 07/09/2014  PCP: [name], MD  Admit date: 07/09/2014 Discharge date: 07/13/2014  Time spent: > 35 minutes  Recommendations for Outpatient Follow-up:  1. Please see d/c instructions below  Discharge Diagnoses:  Please see list below  Discharge Condition: stable  Diet recommendation: low sodium heart healthy  Filed Weights   07/09/14 2233 07/10/14 0201 07/10/14 1756  Weight: 81.647 kg (180 lb) 81.8 kg (180 lb 5.4 oz) 84.369 kg (186 lb)    History of present illness:  73 y/o y.o. Male with history of diabetes mellitus, hypertension, hyperlipidemia and peripheral vascular disease,  coronary artery disease, who was taken to the emergency room following a witnessed generalized seizure. Patient complained of visual changes prior to losing consciousness. He described flashing lights in the right visual field. Patient was postictal and confused on arrival in the emergency room. Mental status subsequently returned to normal.    Hospital Course:  Principal Problem:  New onset seizure  - Neurology managed while patient in house - Pt placed on Keppra   Cerebral thrombosis with cerebral infarction  - MRI reports acute left occipital cortical infarct correlating with dense right homonymous hemianopia  - pt is s/p loop recorder placement and TEE on 07/12/14  - Neurology in house and recommended the following:  Stroke: L PCA posterior circulation infarct  MRI Patchy left occipital and pareital infarcts MRA head Widespread intracranial atherosclerotic disease without proximal flow reducing lesion  MRA neck Unremarkable  2D Echo No source of embolus  EEG normal  Loaded with keppra, continue 500 mg q12. Prn ativan  aspirin 81 mg orally every day and clopidogrel 75 mg orally every day prior to admission, now on aspirin 81 mg orally every day and clopidogrel 75 mg orally every day  Lovenox 40 mg sq daily  for VTE prophylaxis  Carb Control for TEE Up with assistance  Resultant R HH, short term memory deficits, poor recall, headache  Tylenol for tylenol #3 for headache. No ultram as it lowers seizure threshold  Therapy recommendations: OP PT, OP PT Ongoing aggressive risk factor management Disposition: Home with OP therapies if has transportation, if not, then [facility]   Active Problems:  CAD (coronary artery disease)  - pt on aspirin and plavix  - also on statin  - stable no chest pain reported.   Dyslipidemia  - stable on statin   Diabetic hyperosmolar non-ketotic state/DM2  - Pt to continue home regimen on d/c - Heart healthy diet. /diabetic diet  Uncontrolled hypertension  - Continue home regimen on discharge.  Visual field defect  - secondary to new onset stroke  Procedures: Dg Chest 1 View  07/09/2014 No acute findings.  Ct Head Wo Contrast  07/09/2014 1. There is no acute intracranial hemorrhage nor other acute intracranial abnormality. There are mild stable age related atrophic changes. 2. There is no acute skull fracture.  Ct Cervical Spine Wo Contrast  07/09/2014 There is no acute cervical spine fracture nor dislocation.  MRI Patchy left occipital and pareital infarcts  MRA head Widespread intracranial atherosclerotic disease as described, without proximal flow reducing lesion of the dominant RIGHT vertebral, basilar, or either proximal PCA.  MRA neckUnremarkable noncontrast extracranial MRA, without flow reducing lesion of the carotid or dominant RIGHT vertebral.  2D Echo Left ventricle: The cavity size was normal. Systolic function was normal. Wall motion was normal; there were no regional wall motion abnormalities. Doppler parameters are consistent with abnormal left  ventricular relaxation (grade 1 diastolic dysfunction).  EEG normal  Consultations:  Neurology: Dr. Leonie Man  Discharge Exam: Filed Vitals:   07/13/14 1435  BP: 116/52  Pulse: 79  Temp: 97.9 F (36.6 C)   Resp: 20    General: Pt in nad, alert and awake Cardiovascular: rrr, no mrg Respiratory: cta bl, no wheezes, no increased wob  Discharge Instructions You were cared for by a hospitalist during your hospital stay. If you have any questions about your discharge medications or the care you received while you were in the hospital after you are discharged, you can call the unit and asked to speak with the hospitalist on call if the hospitalist that took care of you is not available. Once you are discharged, your primary care physician will handle any further medical issues. Please note that NO REFILLS for any discharge medications will be authorized once you are discharged, as it is imperative that you return to your primary care physician (or establish a relationship with a primary care physician if you do not have one) for your aftercare needs so that they can reassess your need for medications and monitor your lab values.  Discharge Instructions   Call MD for:  temperature >100.4    Complete by:  As directed      Diet - low sodium heart healthy    Complete by:  As directed      Discharge instructions    Complete by:  As directed   Home with home health. Follow up with neurology in 2-3 weeks after hospital discharge.     Increase activity slowly    Complete by:  As directed           Current Discharge Medication List    START taking these medications   Details  levETIRAcetam (KEPPRA) 500 MG tablet Take 1 tablet (500 mg total) by mouth 2 (two) times daily. Qty: 60 tablet, Refills: 0      CONTINUE these medications which have NOT CHANGED   Details  aspirin 81 MG tablet Take 81 mg by mouth daily.    atorvastatin (LIPITOR) 40 MG tablet Take 1.5 tablets (60 mg total) by mouth daily. Qty: 135 tablet, Refills: 3    carvedilol (COREG) 6.25 MG tablet Take 6.25 mg by mouth 2 (two) times daily with a meal.    cholecalciferol (VITAMIN D) 1000 UNITS tablet Take 1,000 Units by mouth daily.     clopidogrel (PLAVIX) 75 MG tablet Take 75 mg by mouth daily with breakfast.    diphenhydramine-acetaminophen (TYLENOL PM) 25-500 MG TABS Take 1 tablet by mouth at bedtime as needed (sleep/pain).    fluticasone (FLONASE) 50 MCG/ACT nasal spray Place 1 spray into both nostrils daily.    hydroxyurea (HYDREA) 500 MG capsule Take 1 capsule (500 mg total) by mouth 2 (two) times daily. Qty: 60 capsule, Refills: 3   Associated Diagnoses: Myelofibrosis    Insulin Glargine (LANTUS SOLOSTAR) 100 UNIT/ML Solostar Pen Inject 10 Units into the skin 2 (two) times daily.    isosorbide mononitrate (IMDUR) 30 MG 24 hr tablet Take 30 mg by mouth daily.    Linagliptin-Metformin HCl 2.5-500 MG TABS Take 1 tablet by mouth 2 (two) times daily.    lisinopril (PRINIVIL,ZESTRIL) 20 MG tablet Take 20 mg by mouth daily.    metoCLOPramide (REGLAN) 10 MG tablet Take 1 tablet (10 mg total) by mouth every 8 (eight) hours as needed for nausea. Qty: 30 tablet, Refills: 0    NITROSTAT 0.4 MG  SL tablet Place 0.4 mg under the tongue every 5 (five) minutes as needed for chest pain.     PROAIR HFA 108 (90 BASE) MCG/ACT inhaler Inhale 1 puff into the lungs every 4 (four) hours as needed for wheezing or shortness of breath.       STOP taking these medications     ibuprofen (ADVIL,MOTRIN) 200 MG tablet        No Known Allergies    The results of significant diagnostics from this hospitalization (including imaging, microbiology, ancillary and laboratory) are listed below for reference.    Significant Diagnostic Studies: Dg Chest 1 View  07/09/2014   CLINICAL DATA:  Seizures, acute syncope, initial encounter.  EXAM: CHEST - 1 VIEW  COMPARISON:  10/27/2010.  FINDINGS: Trachea is midline. Heart size stable. Lungs are somewhat low in volume with linear scarring at the left lung base. No airspace consolidation. Thickening of the minor fissure. No pleural fluid.  IMPRESSION: No acute findings.   Electronically Signed    By: Lorin Picket M.D.   On: 07/09/2014 21:51   Ct Head Wo Contrast  07/09/2014   CLINICAL DATA:  Severe headache; confusion ; history of standing up after date air in the coming week in falling to the floor with seizure like activity ; the patient was incontinent of urine and agitated after seizure activity ; hyperglycemia at the seen  EXAM: CT HEAD WITHOUT CONTRAST  CT CERVICAL SPINE WITHOUT CONTRAST  TECHNIQUE: Multidetector CT imaging of the head and cervical spine was performed following the standard protocol without intravenous contrast. Multiplanar CT image reconstructions of the cervical spine were also generated.  COMPARISON:  Noncontrast CT scan of the brain dated Dated June 30, 2014  FINDINGS: CT HEAD FINDINGS  There is mild diffuse cerebral and cerebellar atrophy with compensatory ventriculomegaly. There is no intracranial hemorrhage nor intracranial mass effect. There is no acute ischemic change. The cerebellum and brainstem are unremarkable. The observed paranasal sinuses exhibit no air-fluid levels. There is no acute skull fracture.  CT CERVICAL SPINE FINDINGS  The cervical vertebral bodies are preserved in height. The prevertebral soft tissue spaces are normal. The intervertebral disc space heights are well maintained. There is no perched facet nor spinous process fracture. The odontoid is intact. The visualized portions of the pulmonary apices and the soft tissues of the neck are unremarkable. There is mild prominence of the thyroid isthmus.  IMPRESSION: 1. There is no acute intracranial hemorrhage nor other acute intracranial abnormality. There are mild stable age related atrophic changes. 2. There is no acute skull fracture. 3. There is no acute cervical spine fracture nor dislocation.   Electronically Signed   By: David  Martinique   On: 07/09/2014 21:40   Ct Head Wo Contrast  06/30/2014   CLINICAL DATA:  Headache for several days  EXAM: CT HEAD WITHOUT CONTRAST  TECHNIQUE: Contiguous  axial images were obtained from the base of the skull through the vertex without intravenous contrast.  COMPARISON:  None.  FINDINGS: No acute intracranial hemorrhage. No focal mass lesion. No CT evidence of acute infarction. No midline shift or mass effect. No hydrocephalus. Basilar cisterns are patent. Mild periventricular white matter hypodensities. Paranasal sinuses and mastoid air cells are clear.  IMPRESSION: No acute intracranial findings. Mild white matter microvascular disease.   Electronically Signed   By: Suzy Bouchard M.D.   On: 06/30/2014 18:13   Ct Cervical Spine Wo Contrast  07/09/2014   CLINICAL DATA:  Severe headache; confusion ;  history of standing up after date air in the coming week in falling to the floor with seizure like activity ; the patient was incontinent of urine and agitated after seizure activity ; hyperglycemia at the seen  EXAM: CT HEAD WITHOUT CONTRAST  CT CERVICAL SPINE WITHOUT CONTRAST  TECHNIQUE: Multidetector CT imaging of the head and cervical spine was performed following the standard protocol without intravenous contrast. Multiplanar CT image reconstructions of the cervical spine were also generated.  COMPARISON:  Noncontrast CT scan of the brain dated Dated June 30, 2014  FINDINGS: CT HEAD FINDINGS  There is mild diffuse cerebral and cerebellar atrophy with compensatory ventriculomegaly. There is no intracranial hemorrhage nor intracranial mass effect. There is no acute ischemic change. The cerebellum and brainstem are unremarkable. The observed paranasal sinuses exhibit no air-fluid levels. There is no acute skull fracture.  CT CERVICAL SPINE FINDINGS  The cervical vertebral bodies are preserved in height. The prevertebral soft tissue spaces are normal. The intervertebral disc space heights are well maintained. There is no perched facet nor spinous process fracture. The odontoid is intact. The visualized portions of the pulmonary apices and the soft tissues of the  neck are unremarkable. There is mild prominence of the thyroid isthmus.  IMPRESSION: 1. There is no acute intracranial hemorrhage nor other acute intracranial abnormality. There are mild stable age related atrophic changes. 2. There is no acute skull fracture. 3. There is no acute cervical spine fracture nor dislocation.   Electronically Signed   By: David  Martinique   On: 07/09/2014 21:40   Mr Jodene Nam Head Wo Contrast  07/10/2014   CLINICAL DATA:  Headache and confusion. Generalized seizure. Dense RIGHT homonymous hemianopia. Stroke risk factors include hypertension, diabetes, and hyperlipidemia. Patient is anticoagulated.  EXAM: MRI HEAD WITHOUT CONTRAST  MRA HEAD WITHOUT CONTRAST  MRA NECK WITHOUT CONTRAST  TECHNIQUE: Multiplanar, multiecho pulse sequences of the brain and surrounding structures were obtained without intravenous contrast. Angiographic images of the Circle of Willis were obtained using MRA technique without intravenous contrast. Angiographic images of the neck were obtained using MRA technique without intravenous contrast. Carotid stenosis measurements (when applicable) are obtained utilizing NASCET criteria, using the distal internal carotid diameter as the denominator.  COMPARISON:  CT head 07/09/2014.  FINDINGS: MRI HEAD FINDINGS  The patient was unable to remain motionless for the exam. Small or subtle lesions could be overlooked.  There is restricted diffusion primarily involving the LEFT occipital cortex, with minimal underlying white matter involvement, sparing the brainstem, cerebellum, and thalamus consistent with a distal LEFT PCA thrombotic event. No other areas of acute infarction are identified. Generalized atrophy with chronic microvascular ischemic change. No acute or chronic blood products. 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  Dysconjugate gaze. Partial empty sella. No acute sinus or mastoid  disease. Compared with recent CT, the infarct is not visible.  MRA HEAD FINDINGS  Dolichoectatic cerebral vasculature. Moderate irregularity of the cavernous and supraclinoid ICA segments suspected flow reducing stenoses of 75% involving the RIGHT supraclinoid ICA and LEFT inferior cavernous ICA. Basilar artery widely patent with LEFT vertebral dominant. Moderately diseased LEFT vertebral ends in PICA.  RIGHT A1 ACA is dominant. Mildly irregular but non stenotic LEFT ACA. 50% proximal stenosis with diffuse irregularity of the RIGHT M1 MCA. Diffuse irregularity with suspected 75% stenosis LEFT distal M1 MCA.  Mildly irregular BILATERAL PCA vessels without proximal stenosis. Distal ramifications of the MCA and  PCA vessels are poorly visualized. No definite cerebellar branch occlusion.  MRA NECK FINDINGS  Noncontrast extracranial MRA demonstrates grossly patent carotid bifurcations bilaterally without obvious flow reducing lesion. No carotid dissection is evident. The RIGHT vertebral is widely patent through the neck and is the dominant vessel. LEFT vertebral is suboptimally evaluated due to its small size.  IMPRESSION: Acute LEFT occipital cortical infarct, correlating with dense RIGHT homonymous hemianopia.  Generalized atrophy with chronic microvascular ischemic change.  Widespread intracranial atherosclerotic disease as described, without proximal flow reducing lesion of the dominant RIGHT vertebral, basilar, or either proximal PCA.  Unremarkable noncontrast extracranial MRA, without flow reducing lesion of the carotid or dominant RIGHT vertebral.   Electronically Signed   By: Rolla Flatten M.D.   On: 07/10/2014 17:58   Mr Angiogram Neck Wo Contrast  07/10/2014   CLINICAL DATA:  Headache and confusion. Generalized seizure. Dense RIGHT homonymous hemianopia. Stroke risk factors include hypertension, diabetes, and hyperlipidemia. Patient is anticoagulated.  EXAM: MRI HEAD WITHOUT CONTRAST  MRA HEAD WITHOUT CONTRAST   MRA NECK WITHOUT CONTRAST  TECHNIQUE: Multiplanar, multiecho pulse sequences of the brain and surrounding structures were obtained without intravenous contrast. Angiographic images of the Circle of Willis were obtained using MRA technique without intravenous contrast. Angiographic images of the neck were obtained using MRA technique without intravenous contrast. Carotid stenosis measurements (when applicable) are obtained utilizing NASCET criteria, using the distal internal carotid diameter as the denominator.  COMPARISON:  CT head 07/09/2014.  FINDINGS: MRI HEAD FINDINGS  The patient was unable to remain motionless for the exam. Small or subtle lesions could be overlooked.  There is restricted diffusion primarily involving the LEFT occipital cortex, with minimal underlying white matter involvement, sparing the brainstem, cerebellum, and thalamus consistent with a distal LEFT PCA thrombotic event. No other areas of acute infarction are identified. Generalized atrophy with chronic microvascular ischemic change. No acute or chronic blood products. 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  Dysconjugate gaze. Partial empty sella. No acute sinus or mastoid disease. Compared with recent CT, the infarct is not visible.  MRA HEAD FINDINGS  Dolichoectatic cerebral vasculature. Moderate irregularity of the cavernous and supraclinoid ICA segments suspected flow reducing stenoses of 75% involving the RIGHT supraclinoid ICA and LEFT inferior cavernous ICA. Basilar artery widely patent with LEFT vertebral dominant. Moderately diseased LEFT vertebral ends in PICA.  RIGHT A1 ACA is dominant. Mildly irregular but non stenotic LEFT ACA. 50% proximal stenosis with diffuse irregularity of the RIGHT M1 MCA. Diffuse irregularity with suspected 75% stenosis LEFT distal M1 MCA.  Mildly irregular BILATERAL PCA vessels without proximal stenosis.  Distal ramifications of the MCA and PCA vessels are poorly visualized. No definite cerebellar branch occlusion.  MRA NECK FINDINGS  Noncontrast extracranial MRA demonstrates grossly patent carotid bifurcations bilaterally without obvious flow reducing lesion. No carotid dissection is evident. The RIGHT vertebral is widely patent through the neck and is the dominant vessel. LEFT vertebral is suboptimally evaluated due to its small size.  IMPRESSION: Acute LEFT occipital cortical infarct, correlating with dense RIGHT homonymous hemianopia.  Generalized atrophy with chronic microvascular ischemic change.  Widespread intracranial atherosclerotic disease as described, without proximal flow reducing lesion of the dominant RIGHT vertebral, basilar, or either proximal PCA.  Unremarkable noncontrast extracranial MRA, without flow reducing lesion of the carotid or dominant RIGHT vertebral.   Electronically Signed   By: Rolla Flatten M.D.   On: 07/10/2014 17:58  Mr Brain Wo Contrast  07/10/2014   CLINICAL DATA:  Headache and confusion. Generalized seizure. Dense RIGHT homonymous hemianopia. Stroke risk factors include hypertension, diabetes, and hyperlipidemia. Patient is anticoagulated.  EXAM: MRI HEAD WITHOUT CONTRAST  MRA HEAD WITHOUT CONTRAST  MRA NECK WITHOUT CONTRAST  TECHNIQUE: Multiplanar, multiecho pulse sequences of the brain and surrounding structures were obtained without intravenous contrast. Angiographic images of the Circle of Willis were obtained using MRA technique without intravenous contrast. Angiographic images of the neck were obtained using MRA technique without intravenous contrast. Carotid stenosis measurements (when applicable) are obtained utilizing NASCET criteria, using the distal internal carotid diameter as the denominator.  COMPARISON:  CT head 07/09/2014.  FINDINGS: MRI HEAD FINDINGS  The patient was unable to remain motionless for the exam. Small or subtle lesions could be overlooked.  There  is restricted diffusion primarily involving the LEFT occipital cortex, with minimal underlying white matter involvement, sparing the brainstem, cerebellum, and thalamus consistent with a distal LEFT PCA thrombotic event. No other areas of acute infarction are identified. Generalized atrophy with chronic microvascular ischemic change. No acute or chronic blood products. 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  Dysconjugate gaze. Partial empty sella. No acute sinus or mastoid disease. Compared with recent CT, the infarct is not visible.  MRA HEAD FINDINGS  Dolichoectatic cerebral vasculature. Moderate irregularity of the cavernous and supraclinoid ICA segments suspected flow reducing stenoses of 75% involving the RIGHT supraclinoid ICA and LEFT inferior cavernous ICA. Basilar artery widely patent with LEFT vertebral dominant. Moderately diseased LEFT vertebral ends in PICA.  RIGHT A1 ACA is dominant. Mildly irregular but non stenotic LEFT ACA. 50% proximal stenosis with diffuse irregularity of the RIGHT M1 MCA. Diffuse irregularity with suspected 75% stenosis LEFT distal M1 MCA.  Mildly irregular BILATERAL PCA vessels without proximal stenosis. Distal ramifications of the MCA and PCA vessels are poorly visualized. No definite cerebellar branch occlusion.  MRA NECK FINDINGS  Noncontrast extracranial MRA demonstrates grossly patent carotid bifurcations bilaterally without obvious flow reducing lesion. No carotid dissection is evident. The RIGHT vertebral is widely patent through the neck and is the dominant vessel. LEFT vertebral is suboptimally evaluated due to its small size.  IMPRESSION: Acute LEFT occipital cortical infarct, correlating with dense RIGHT homonymous hemianopia.  Generalized atrophy with chronic microvascular ischemic change.  Widespread intracranial atherosclerotic disease as described, without proximal flow  reducing lesion of the dominant RIGHT vertebral, basilar, or either proximal PCA.  Unremarkable noncontrast extracranial MRA, without flow reducing lesion of the carotid or dominant RIGHT vertebral.   Electronically Signed   By: Rolla Flatten M.D.   On: 07/10/2014 17:58    Microbiology: No results found for this or any previous visit (from the past 240 hour(s)).   Labs: Basic Metabolic Panel:  Recent Labs Lab 07/07/14 1539 07/09/14 2000 07/10/14 0600 07/13/14 0640  NA 132* 133* 143 138  K 4.3 4.7 3.4* 4.3  CL 93* 95* 106 101  CO2 24 23 24 27   GLUCOSE 453* 604* 102* 244*  BUN 13 13 9 9   CREATININE 0.97 0.97 0.79 0.96  CALCIUM 9.6 9.1 8.7 8.8   Liver Function Tests:  Recent Labs Lab 07/07/14 1539 07/09/14 2000  AST 15 14  ALT 14 12  ALKPHOS 106 121*  BILITOT 1.3* 1.0  PROT 6.5 6.6  ALBUMIN 3.6 3.5   No results found for this basename: LIPASE, AMYLASE,  in the last 168  hours No results found for this basename: AMMONIA,  in the last 168 hours CBC:  Recent Labs Lab 07/07/14 1539 07/09/14 2000 07/10/14 0600  WBC 12.5* 12.4* 12.5*  NEUTROABS 10.6* 11.5*  --   HGB 17.6* 16.2 14.6  HCT 53.6* 49.7 46.2  MCV 80.4 80.6 79.7  PLT 433* 331 326   Cardiac Enzymes: No results found for this basename: CKTOTAL, CKMB, CKMBINDEX, TROPONINI,  in the last 168 hours BNP: BNP (last 3 results) No results found for this basename: PROBNP,  in the last 8760 hours CBG:  Recent Labs Lab 07/12/14 0619 07/12/14 1619 07/12/14 2200 07/13/14 0649 07/13/14 1214  GLUCAP 276* 248* 339* 256* 227*       Signed:  Velvet Bathe  [facility] 07/13/2014, 3:36 PM

## 2014-07-13 NOTE — Progress Notes (Signed)
STROKE TEAM PROGRESS NOTE   HISTORY Tyler Whitehead is an 73 y.o. male history diabetes mellitus, hypertension, hyperlipidemia and peripheral vascular disease, as well as coronary artery disease, brought to the emergency room following a witnessed generalized seizure. Patient complained of visual changes prior to losing consciousness. He described flashing lights in the right visual field. Patient was postictal and confused on arrival in the emergency room. Mental status subsequently returned to normal. He was given a loading dose of Keppra 1000 mg IV and started on 500 mg twice a day for maintenance. He also indicates that he lost vision on the right side 1-2 days prior to admission. There's no previous history of stroke or TIA. He has been on aspirin and Plavix daily. CT scan of his head showed no acute intracranial abnormality. NIH stroke score was 2 (dense right homonymous hemianopsia). Last known well is unclear. Patient was not administered TPA secondary to unknown time last known well. He was admitted for further evaluation and treatment.   SUBJECTIVE (INTERVAL HISTORY) Patient had TEE yesterday which was unremarkable and had loop recorder placed. No complaints today  OBJECTIVE Temp:  [97.9 F (36.6 C)-98.6 F (37 C)] 97.9 F (36.6 C) (10/06 1435) Pulse Rate:  [70-89] 79 (10/06 1435) Cardiac Rhythm:  [-] Normal sinus rhythm (10/05 2050) Resp:  [18-20] 20 (10/06 1435) BP: (116-139)/(52-81) 116/52 mmHg (10/06 1435) SpO2:  [97 %-100 %] 97 % (10/06 1435)   Recent Labs Lab 07/12/14 0619 07/12/14 1619 07/12/14 2200 07/13/14 0649 07/13/14 1214  GLUCAP 276* 248* 339* 256* 227*    Recent Labs Lab 07/07/14 1539 07/09/14 2000 07/10/14 0600 07/13/14 0640  NA 132* 133* 143 138  K 4.3 4.7 3.4* 4.3  CL 93* 95* 106 101  CO2 24 23 24 27   GLUCOSE 453* 604* 102* 244*  BUN 13 13 9 9   CREATININE 0.97 0.97 0.79 0.96  CALCIUM 9.6 9.1 8.7 8.8    Recent Labs Lab 07/07/14 1539  07/09/14 2000  AST 15 14  ALT 14 12  ALKPHOS 106 121*  BILITOT 1.3* 1.0  PROT 6.5 6.6  ALBUMIN 3.6 3.5    Recent Labs Lab 07/07/14 1539 07/09/14 2000 07/10/14 0600  WBC 12.5* 12.4* 12.5*  NEUTROABS 10.6* 11.5*  --   HGB 17.6* 16.2 14.6  HCT 53.6* 49.7 46.2  MCV 80.4 80.6 79.7  PLT 433* 331 326   No results found for this basename: CKTOTAL, CKMB, CKMBINDEX, TROPONINI,  in the last 168 hours No results found for this basename: LABPROT, INR,  in the last 72 hours  Recent Labs  07/12/14 0550  COLORURINE YELLOW  LABSPEC 1.015  PHURINE 5.0  GLUCOSEU >1000*  HGBUR NEGATIVE  BILIRUBINUR NEGATIVE  KETONESUR 15*  PROTEINUR NEGATIVE  UROBILINOGEN 0.2  NITRITE NEGATIVE  LEUKOCYTESUR NEGATIVE    Lab Results  Component Value Date   HGBA1C 12.9* 07/10/2014   No results found for this basename: labopia,  cocainscrnur,  labbenz,  amphetmu,  thcu,  labbarb    No results found for this basename: ETH,  in the last 168 hours  Dg Chest 1 View 07/09/2014    No acute findings.     Ct Head Wo Contrast 07/09/2014    1. There is no acute intracranial hemorrhage nor other acute intracranial abnormality. There are mild stable age related atrophic changes. 2. There is no acute skull fracture.   Ct Cervical Spine Wo Contrast 07/09/2014   There is no acute cervical spine fracture nor dislocation.  MRI  Patchy left occipital and pareital infarcts  MRA head Widespread intracranial atherosclerotic disease as described, without proximal flow reducing lesion of the dominant RIGHT vertebral, basilar, or either proximal PCA.   MRA neckUnremarkable noncontrast extracranial MRA, without flow reducing lesion of the carotid or dominant RIGHT vertebral.     2D Echo  Left ventricle: The cavity size was normal. Systolic function was normal. Wall motion was normal; there were no regional wall motion abnormalities. Doppler parameters are consistent with abnormal left ventricular relaxation (grade 1  diastolic dysfunction).  EEG normal   PHYSICAL EXAM Awake alert. Afebrile. Head is nontraumatic. Neck is supple without bruit. Hearing is normal. Cardiac exam no murmur or gallop. Lungs are clear to auscultation. Distal pulses are well felt. Neurological Exam ;  Awake  Alert oriented x 3. Normal speech and language.Diminished recall 0/3.eye movements full without nystagmus.fundi were not visualized. Vision acuity  appear normal.Dense right homonymous hemianopsia Hearing is normal. Palatal movements are normal. Face symmetric. Tongue midline. Normal strength, tone, reflexes and coordination. Normal sensation. Gait deferred. ASSESSMENT/PLAN  Tyler Whitehead is a 73 y.o. male with history of diabetes mellitus, hypertension, hyperlipidemia and peripheral vascular disease, and coronary artery disease  presenting with new onset generalized seizure activity and loss of vision on the right side. He did not receive IV t-PA due to delay in arrival. Initial CT imaging unrevealing.    Stroke:  L PCA posterior circulation infarct  MRI  Patchy left occipital and pareital infarcts  MRA head Widespread intracranial atherosclerotic disease without proximal flow reducing lesion   MRA neck Unremarkable   2D Echo  No source of embolus  EEG normal  Loaded with keppra, continue 500 mg q12. Prn ativan  aspirin 81 mg orally every day and clopidogrel 75 mg orally every day prior to admission, now on aspirin 81 mg orally every day and clopidogrel 75 mg orally every day  Lovenox 40 mg sq daily for VTE prophylaxis  Carb Control for TEE   Up with assistance  Resultant R HH, short term memory deficits, poor recall, headache  Tylenol for tylenol #3 for headache. No ultram as it lowers seizure threshold   Therapy recommendations:  OP PT, OP PT  Ongoing aggressive risk factor management  Disposition:  Home with OP therapies if has transportation, if not, then [facility]  Hypertension   Home meds:   Coreg,  lisinopril  Agree with current treatment   BP goal long term normotensive  stable  Hyperlipidemia  On lipitor 40 at home, increased to 60  LDL ordered   Continue statin  Continue statin at discharge  Diabetes  Initially placed on glucose stabilizer, now off  HgbA1c 12.9, Goal < 7.0  uncontrolled  Other Stroke Risk Factors Advanced age   Coronary artery disease - stent 2014, CABG PVD  Other Pertinent History  Primary myelofibrosis  Polycythemia vera  Hospital day # 4  SHARON BIBY, MSN, RN, ANVP-BC, ANP-BC, [facility] Pager: [phone] 07/13/2014 3:08 PM  I have personally examined this patient, reviewed notes, independently viewed imaging studies, participated in medical decision making and plan of care. I have made any additions or clarifications directly to the above note. Agree with note above.  Stroke team will sign off. Call for questions. Antony Contras, MD Medical Director [facility] Pager: [phone] 07/13/2014 3:08 PM     To contact Stroke Continuity provider, please refer to [email or web address] After hours, contact General Neurology

## 2014-07-13 NOTE — Progress Notes (Signed)
Cane/ DME ordered through [facility] as requested to be delivered to his room prior to discharge home/ [place] arranged with [facility] also; B Pennie Rushing [phone]

## 2014-07-13 NOTE — Progress Notes (Signed)
Dynamic Gait index   07/13/14 1400  Dynamic Gait Index  Level Surface 3  Change in Gait Speed 3  Gait with Horizontal Head Turns 2  Gait with Vertical Head Turns 2  Gait and Pivot Turn 3  Step Over Obstacle 3  Step Around Obstacles 3  Steps 2  Total Score 21     Mohanad, Carsten   OTR/L Pager: [phone] Office: [phone] .

## 2014-07-13 NOTE — Progress Notes (Addendum)
Physical Therapy Treatment Patient Details Name: Tyler Whitehead MRN: [id number] DOB: [birth year] Today's Date: 07/13/2014    History of Present Illness Tyler Whitehead is a 73 y.o. male admitted 07/09/14 following a witnessed generalized seizure. Pt c/o visual changes prior to losing consciousness. 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 PMH of DM, HTN, hyperlipidemia, PVD, and CAD.     PT Comments    Pt is progressing well with his mobility. His awareness is improving, but he still needs some assist to anticipate when he may lose his balance.  Gait balance improved with the cane.  He will need one for home use. PT will continue to follow acutely and plan to work on higher level static balance exercises next session.    Follow Up Recommendations  Home health PT;Supervision/Assistance - 24 hour     Equipment Recommendations  Cane    Recommendations for Other Services   NA     Precautions / Restrictions Precautions Precautions: Fall Precaution Comments: tending to lose balance to L while walking Restrictions Weight Bearing Restrictions: No    Mobility                   Transfers Overall transfer level: Modified independent Equipment used: Straight cane Transfers: Sit to/from Stand Sit to Stand: Modified independent (Device/Increase time)         General transfer comment: Pt relying on chair armrests for transitions and cane once standing.   Ambulation/Gait Ambulation/Gait assistance: Supervision Ambulation Distance (Feet): 510 Feet Assistive device: Straight cane Gait Pattern/deviations: Step-through pattern;Staggering left;Staggering right Gait velocity: decreased ("but I walk slow normally") Gait velocity interpretation: Below normal speed for age/gender General Gait Details: pt continues to stagger with turns and head turns.  Dynamically worked on vertical and horizontal head truns, direction  changes, 180 degree trun, walking backwards and picking up objects from the floor.  Pt is generally supervision, but required min assit for first attempt at object pick up from the flooor because he tried to do it too quickly.        Modified Rankin (Stroke Patients Only) Modified Rankin (Stroke Patients Only) Pre-Morbid Rankin Score: No symptoms Modified Rankin: Moderately severe disability     Balance Overall balance assessment: Independent Sitting-balance support: No upper extremity supported;Feet supported       Standing balance support: No upper extremity supported Standing balance-Leahy Scale: Good Standing balance comment: deficits are noticable, but he can stand and do simple dynamic tasks without assist or with supervision                    Cognition Arousal/Alertness: Awake/alert Behavior During Therapy: Impulsive (a bit impilsive, quick to move knowing he has balance defici) Overall Cognitive Status: Impaired/Different from baseline Area of Impairment: Safety/judgement;Problem solving   Current Attention Level: Alternating (increased LOB with attempts at alternating)   Following Commands: Follows multi-step commands inconsistently Safety/Judgement: Decreased awareness of safety Awareness: Emergent (with some moments of anticipatory with repetition)   General Comments: Pt showing more awareness of deficits today.  He is more open to AD use and with repetition of chanllenging dynamic tasks he can anticipate to slow down or modify his movements, but not on the inital trial (i.e. LOB when going to pick something off of the floor- 1st time went too fast, second time went much slower).            Pertinent Vitals/Pain No reports during PT session  PT Goals (current goals can now be found in the care plan section) Acute Rehab PT Goals Patient Stated Goal: to get home soon Progress towards PT goals: Progressing toward goals    Frequency  Min  4X/week    PT Plan Current plan remains appropriate       End of Session Equipment Utilized During Treatment: Gait belt Activity Tolerance: Patient tolerated treatment well Patient left: in chair;with call bell/phone within reach;with chair alarm set     Time: 1439-1450 PT Time Calculation (min): 11 min  Charges:  $Gait Training: 8-22 mins                  Kylyn Sookram B. Rashaun Wichert, PT, DPT [phone]   07/13/2014, 3:09 PM

## 2014-07-13 NOTE — Progress Notes (Signed)
Transitional Care Clinic Care Coordination Note:  Admit date:  07/09/14 Discharge date: 07/13/14 Discharge Disposition: Home/ self care Patient contact information:  [phone]  Patient evaluated for community Transitional Care Management services with TCC Program as a benefit of patient's [employer]. Discussed TCC Care Management services. TCC Services has been accepted by written consent. Wife Xerxes Agrusa [phone]) is patient's authorized contact. Patient has had 3 ED visits and 1 admission in the past 6 months. PMH diabetes mellitus, hypertension, hyperlipidemia and peripheral vascular disease, coronary artery disease, polycythemia vera.  TCC patients will receive a post discharge transition of care call 24- 48 hours, and will be evaluated for close f/u for up to initial 30 days post discharge.  TCC has Scheduled post discharge f/u visit for assessments and disease process education for 07/16/14 at 10:45am.  Explained that TCC Care Management services does not replace or interfere with any services that are arranged by inpatient case management or social work. For additional questions or referrals please contact   [facility] at [phone]    Consented for Chronic Care Management for [facility] by patient.  Patient scheduled for Transitional Care appointment on Friday. 07/16/14 at 10:45.  Clinic information and appointment time provided to patient.   Hospital Course:   Principal Problem:   New onset seizure    Neurology managed while patient in house Pt placed on Keppra  Cerebral thrombosis with cerebral infarction   -MRI reports acute left occipital cortical infarct correlating with dense right homonymous hemianopia   -s/p loop recorder placement and TEE on 07/12/14   -Neurology in house and recommended the following: Stroke: L PCA posterior circulation infarct  MRI Patchy left occipital and pareital infarcts MRA head Widespread intracranial  atherosclerotic disease without proximal flow reducing lesion   MRA neck Unremarkable   2D Echo No source of embolus   EEG normal   Loaded with keppra, continue 500 mg q12. Prn ativan   aspirin 81 mg orally every day and clopidogrel 75 mg orally every day prior to admission, now on aspirin 81 mg orally every day and clopidogrel 75 mg orally every day   Lovenox 40 mg sq daily for VTE prophylaxis   Carb Control for TEE Up with assistance   Resultant R HH, short term memory deficits, poor recall, headache   Tylenol for tylenol #3 for headache. No ultram as it lowers seizure threshold   Therapy recommendations: OP PT, OP PT Assessment:       Home Environment: lives w/ wife (primary care giver)       Support System: daughter       Level of functioning: independent       Home DME: cane       Home care services: none       Transportation: family private vehicle (Patient states he is unable to drive due to vision deficit)        Medications: Patient has [facility] and denies having any difficulty getting his medication       Pharmacy: [facility]       Identified Barriers:                PCP:  Dr. Sheryn Bison (will transition after TCC) Last visit 9/15        [facility] Dr Bernadene Bell,  Hematologist/ Oncologist) next appt 07/20/14 at 3:00p              Arranged services:  Services communicated to Olga Coaster NCM/ [place] on 4N  Patient Education:    Patient verbalizes understanding of [facility] services and aware of appointment time/location. All information provided.

## 2014-07-14 ENCOUNTER — Telehealth: Payer: Self-pay | Admitting: *Deleted

## 2014-07-14 NOTE — Telephone Encounter (Signed)
Transitional Care Clinic Post-discharge Follow-Up Phone Call:   Date of Discharge: 07/13/2014  Principal Discharge Diagnoses: New onset seizure  Cerebral thrombosis with cerebral infarction   Reason for Chronic Case Management: Medical management of co-morbidities CAD (coronary artery disease)  Dyslipidemia  Diabetic hyperosmolar non-ketotic state/DM2  Uncontrolled hypertension  Visual field defect   Post-discharge Communication:   Call Completed: Yes, spoke with patient on 07/14/14 at 1430 for 10 minutes.  Interpreter Needed: No   Please check all that apply:  X  Patient states he is knowledgeable of his condition(s) and treatment.  ? Family and/or caregiver is knowledgeable of patient's condition(s) and/or treatment.  ? Patient is caring for self at home. Patient states he has assistance from wife ? Patient is receiving home health services. If so, name of agency. Patient states that he has signed up for [facility] services but hasn't started yet.  Medication Reconciliation:  X  Medication list reviewed with patient.  X  Patient able to obtain needed medications. Patient states he has all meds. States he needs more test strips to monitor glucose but has a refill at the pharmacy that he can pick up. Patient will call TCC if any difficulty arises in obtaining test strips.  Activities of Daily Living:  ? Independent  X  Needs assist--Patient states that his wife is assisting him with ADLs at this time. ? Total Care (describe)   Community resources in place for patient:  ? None  X  Home Health (Referral to [facility] placed prior to discharge) ? Assisted Living  ? Hospice  ? Support Group   Patient Education: Patient denies need for education at this time  Topics discussed: Patient reminded of his TCC appt on 07/16/14 at 1045 at [place].  Questions/Concerns discussed: Patient denies questions or concerns at this time. Patient took TCC phone number and will call us  if any concerns arise.

## 2014-07-16 ENCOUNTER — Ambulatory Visit: Payer: Medicare Other | Attending: Internal Medicine | Admitting: Internal Medicine

## 2014-07-16 ENCOUNTER — Other Ambulatory Visit: Payer: Self-pay | Admitting: *Deleted

## 2014-07-16 ENCOUNTER — Other Ambulatory Visit: Payer: Self-pay

## 2014-07-16 ENCOUNTER — Ambulatory Visit (HOSPITAL_COMMUNITY)
Admission: RE | Admit: 2014-07-16 | Discharge: 2014-07-16 | Disposition: A | Discharge status: Home / Self Care | Payer: Medicare Other | Source: Ambulatory Visit | Attending: Family Medicine | Admitting: Family Medicine

## 2014-07-16 VITALS — BP 115/74 | HR 75 | Temp 98.9°F | Resp 20 | Ht 67.0 in | Wt 187.4 lb

## 2014-07-16 DIAGNOSIS — I252 Old myocardial infarction: Secondary | ICD-10-CM | POA: Diagnosis not present

## 2014-07-16 DIAGNOSIS — I739 Peripheral vascular disease, unspecified: Secondary | ICD-10-CM | POA: Diagnosis not present

## 2014-07-16 DIAGNOSIS — I4510 Unspecified right bundle-branch block: Secondary | ICD-10-CM | POA: Insufficient documentation

## 2014-07-16 DIAGNOSIS — Z951 Presence of aortocoronary bypass graft: Secondary | ICD-10-CM | POA: Insufficient documentation

## 2014-07-16 DIAGNOSIS — K5790 Diverticulosis of intestine, part unspecified, without perforation or abscess without bleeding: Secondary | ICD-10-CM | POA: Insufficient documentation

## 2014-07-16 DIAGNOSIS — E785 Hyperlipidemia, unspecified: Secondary | ICD-10-CM | POA: Diagnosis not present

## 2014-07-16 DIAGNOSIS — I8290 Acute embolism and thrombosis of unspecified vein: Secondary | ICD-10-CM | POA: Insufficient documentation

## 2014-07-16 DIAGNOSIS — Z7982 Long term (current) use of aspirin: Secondary | ICD-10-CM | POA: Diagnosis not present

## 2014-07-16 DIAGNOSIS — I2510 Atherosclerotic heart disease of native coronary artery without angina pectoris: Secondary | ICD-10-CM | POA: Diagnosis not present

## 2014-07-16 DIAGNOSIS — I498 Other specified cardiac arrhythmias: Secondary | ICD-10-CM | POA: Diagnosis not present

## 2014-07-16 DIAGNOSIS — E119 Type 2 diabetes mellitus without complications: Secondary | ICD-10-CM | POA: Insufficient documentation

## 2014-07-16 DIAGNOSIS — I10 Essential (primary) hypertension: Secondary | ICD-10-CM | POA: Diagnosis not present

## 2014-07-16 DIAGNOSIS — I4940 Unspecified premature depolarization: Secondary | ICD-10-CM | POA: Diagnosis present

## 2014-07-16 DIAGNOSIS — Z79899 Other long term (current) drug therapy: Secondary | ICD-10-CM | POA: Insufficient documentation

## 2014-07-16 DIAGNOSIS — E1165 Type 2 diabetes mellitus with hyperglycemia: Secondary | ICD-10-CM

## 2014-07-16 LAB — GLUCOSE, POCT (MANUAL RESULT ENTRY): POC Glucose: 231 mg/dl — AB (ref 70–99)

## 2014-07-16 MED ORDER — CARVEDILOL 6.25 MG PO TABS: 6.2500 mg | ORAL_TABLET | Freq: Two times a day (BID) | ORAL | Status: DC

## 2014-07-16 MED ORDER — CLOPIDOGREL BISULFATE 75 MG PO TABS: 75.0000 mg | ORAL_TABLET | Freq: Every day | ORAL | Status: DC

## 2014-07-16 MED ORDER — FLUTICASONE PROPIONATE 50 MCG/ACT NA SUSP: 1.0000 | Freq: Every day | NASAL | Status: DC

## 2014-07-16 MED ORDER — FREESTYLE LANCETS MISC: Status: DC

## 2014-07-16 MED ORDER — LEVETIRACETAM 500 MG PO TABS: 500.0000 mg | ORAL_TABLET | Freq: Two times a day (BID) | ORAL | Status: DC

## 2014-07-16 MED ORDER — LISINOPRIL 20 MG PO TABS: 20.0000 mg | ORAL_TABLET | Freq: Every day | ORAL | Status: DC

## 2014-07-16 MED ORDER — METOCLOPRAMIDE HCL 10 MG PO TABS: 10.0000 mg | ORAL_TABLET | Freq: Three times a day (TID) | ORAL | Status: DC | PRN

## 2014-07-16 MED ORDER — INSULIN GLARGINE 100 UNIT/ML SOLOSTAR PEN: 10.0000 [IU] | PEN_INJECTOR | Freq: Two times a day (BID) | SUBCUTANEOUS | Status: DC

## 2014-07-16 NOTE — Progress Notes (Signed)
CC:  HPI:  Tyler Whitehead is an 73 y.o. male history diabetes mellitus, hypertension, hyperlipidemia and peripheral vascular disease, as well as coronary artery disease, brought to the emergency room following a witnessed generalized seizure. Patient complained of visual changes prior to losing consciousness. He described flashing lights in the right visual field. Patient was postictal and confused on arrival in the emergency room. Mental status subsequently returned to normal. He was given a loading dose of Keppra 1000 mg IV and started on 500 mg twice a day for maintenance. He also indicated that he lost vision on the right side 1-2 days prior to admission. There's no previous history of stroke or TIA. He has been on aspirin and Plavix daily. CT scan of his head showed no acute intracranial abnormality. NIH stroke score was 2 (dense right homonymous hemianopsia). Last known well is unclear. Patient was not administered TPA secondary to unknown time last known well. He was admitted for further evaluation and treatment.  Workup revealed a Stroke: L PCA posterior circulation infarct, MRI Patchy left occipital and pareital infarcts,2D Echo  had a No source of embolus EEG normal, had a  implantable loop recorder placed 10/5 ,now on aspirin 81 mg orally  and clopidogrel 75 mg orally every day.  No CP, SOB   Complaining of visual hallucinations and different shapes and variations of objects in front of him Has HHRN , and HHPT   CBG 193 this am , 230 today at clinic, states lancets were very expensive in [facility]     No Known Allergies Past Medical History  Diagnosis Date  . Primary myelofibrosis   . Diverticular disease   . Hemorrhoid   . CAD (coronary artery disease)     a. S/p 3V CABG 2000 with LIMA-LAD, left radial-PDA, SVG-OM1. b. 2014: stent to SVG-OM1; then found to have obstruction distal to LAD anastamosis of LIMA s/p DES.   Marland Kitchen Peripheral vascular disease     a. R femoropopliteal  bypass ~2012 in [place], reportedly shown to be occluded. Now followed by Dr. [name].  . DM (diabetes mellitus)   . Hyperlipidemia   . HTN (hypertension)   . RBBB    Current Outpatient Prescriptions on File Prior to Visit  Medication Sig Dispense Refill  . aspirin 81 MG tablet Take 81 mg by mouth daily.      Marland Kitchen atorvastatin (LIPITOR) 40 MG tablet Take 1.5 tablets (60 mg total) by mouth daily.  135 tablet  3  . cholecalciferol (VITAMIN D) 1000 UNITS tablet Take 1,000 Units by mouth daily.      . diphenhydramine-acetaminophen (TYLENOL PM) 25-500 MG TABS Take 1 tablet by mouth at bedtime as needed (sleep/pain).      . hydroxyurea (HYDREA) 500 MG capsule Take 1 capsule (500 mg total) by mouth 2 (two) times daily.  60 capsule  3  . isosorbide mononitrate (IMDUR) 30 MG 24 hr tablet Take 30 mg by mouth daily.      . Linagliptin-Metformin HCl 2.5-500 MG TABS Take 1 tablet by mouth 2 (two) times daily.      Marland Kitchen NITROSTAT 0.4 MG SL tablet Place 0.4 mg under the tongue every 5 (five) minutes as needed for chest pain.       Marland Kitchen PROAIR HFA 108 (90 BASE) MCG/ACT inhaler Inhale 1 puff into the lungs every 4 (four) hours as needed for wheezing or shortness of breath.        No current facility-administered medications on file prior to visit.  Family History  Problem Relation Age of Onset  . Hyperlipidemia Maternal Grandmother   . Asthma Father   . Heart disease Father   . CAD      Multiple siblings   History   Social History  . Marital Status: Married    Spouse Name: N/A    Number of Children: N/A  . Years of Education: N/A   Occupational History  . Limo driver    Social History Main Topics  . Smoking status: Never Smoker   . Smokeless tobacco: Never Used  . Alcohol Use: No  . Drug Use: No  . Sexual Activity: Not on file   Other Topics Concern  . Not on file   Social History Narrative   Lives with wife.     Review of Systems  Constitutional: Negative for fever, chills, diaphoresis,  activity change, appetite change and fatigue.  HENT: Negative for ear pain, nosebleeds, congestion, facial swelling, rhinorrhea, neck pain, neck stiffness and ear discharge.   Eyes: Negative for pain, discharge, redness, itching and visual disturbance.  Respiratory: Negative for cough, choking, chest tightness, shortness of breath, wheezing and stridor.   Cardiovascular: Negative for chest pain, palpitations and leg swelling.  Gastrointestinal: Negative for abdominal distention.  Genitourinary: Negative for dysuria, urgency, frequency, hematuria, flank pain, decreased urine volume, difficulty urinating and dyspareunia.  Musculoskeletal: Negative for back pain, joint swelling, arthralgias and gait problem.  Neurological: Negative for dizziness, tremors, seizures, syncope, facial asymmetry, speech difficulty, weakness, light-headedness, numbness and headaches. some visual hallucinations  Hematological: Negative for adenopathy. Does not bruise/bleed easily.  Psychiatric/Behavioral: Negative for hallucinations, behavioral problems, confusion, dysphoric mood, decreased concentration and agitation.    Objective:   Filed Vitals:   07/16/14 1104  BP: 115/74  Pulse: 75  Temp: 98.9 F (37.2 C)  Resp: 20    Physical Exam  Constitutional: Appears well-developed and well-nourished. No distress.  HENT: Normocephalic. External right and left ear normal. Oropharynx is clear and moist.  Eyes: Conjunctivae and EOM are normal. PERRLA, no scleral icterus.  Neck: Normal ROM. Neck supple. No JVD. No tracheal deviation. No thyromegaly.  CVS: RRR, S1/S2 +, no murmurs, no gallops, no carotid bruit.  Pulmonary: Effort and breath sounds normal, no stridor, rhonchi, wheezes, rales.  Abdominal: Soft. BS +,  no distension, tenderness, rebound or guarding.  Musculoskeletal: Normal range of motion. No edema and no tenderness.  Lymphadenopathy: No lymphadenopathy noted, cervical, inguinal. Neuro: Alert. Normal  reflexes, muscle tone coordination. No cranial nerve deficit. Skin: Skin is warm and dry. No rash noted. Not diaphoretic. No erythema. No pallor.  Psychiatric: Normal mood and affect. Behavior, judgment, thought content normal.   Lab Results  Component Value Date   WBC 12.5* 07/10/2014   HGB 14.6 07/10/2014   HCT 46.2 07/10/2014   MCV 79.7 07/10/2014   PLT 326 07/10/2014   Lab Results  Component Value Date   CREATININE 0.96 07/13/2014   BUN 9 07/13/2014   NA 138 07/13/2014   K 4.3 07/13/2014   CL 101 07/13/2014   CO2 27 07/13/2014    Lab Results  Component Value Date   HGBA1C 12.9* 07/10/2014   Lipid Panel     Component Value Date/Time   CHOL 94 07/11/2014 0430   TRIG 101 07/11/2014 0430   HDL 34* 07/11/2014 0430   CHOLHDL 2.8 07/11/2014 0430   VLDL 20 07/11/2014 0430   LDLCALC 40 07/11/2014 0430       Assessment and plan:   Patient Active  Problem List   Diagnosis Date Noted  . Cerebral thrombosis with cerebral infarction 07/10/2014  . New onset seizure 07/09/2014  . Diabetic hyperosmolar non-ketotic state 07/09/2014  . Uncontrolled hypertension 07/09/2014  . Polycythemia vera 07/09/2014  . Seizure 07/09/2014  . Visual field defect 07/09/2014  . Headache 06/30/2014  . HTN (hypertension) 11/23/2013  . Dyslipidemia 11/23/2013  . Myelofibrosis 11/22/2013  . Diverticular disease 11/22/2013  . Hemorrhoids 11/22/2013  . 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 CAD (coronary artery disease) 11/22/2013  . Peripheral vascular disease 11/22/2013  . DM2 (diabetes mellitus, type 2) 11/22/2013  . Renal cyst, left 11/22/2013  . Splenomegaly 11/22/2013       New onset seizure  - Neurology managed while patient in house  Does have a F/U with Dr Leonie Man Continue on [place]    Cerebral thrombosis with cerebral infarction  - MRI reports acute left occipital cortical infarct correlating with dense right homonymous hemianopia  - pt is s/p loop recorder placement and TEE on 07/12/14  - Neurology in  house and recommended the following:  Stroke: L PCA posterior circulation infarct  MRI Patchy left occipital and pareital infarcts  MRA head Widespread intracranial atherosclerotic disease without proximal flow reducing lesion  MRA neck Unremarkable  2D Echo No source of embolus  EEG normal  Loaded with keppra, continue 500 mg q12. Prn ativan   now on aspirin 81 mg  and clopidogrel 75 mg orally every day (refills provided ) Resultant R HH, short term memory deficits, poor recall, headache  Tylenol for tylenol #3 for headache. No ultram as it lowers seizure threshold  Therapy recommendations home : OP PT, OP PT     CAD (coronary artery disease)  Coronary artery disease - stent 2014, CABG - pt on aspirin and plavix  - also on statin  - stable no chest pain reported.   Dyslipidemia  - stable on statin   Diabetic hyperosmolar non-ketotic state/DM2  Initially on glucose stabilizer - Heart healthy diet. /diabetic diet  HgbA1c 12.9, Goal < 7.0 Lancets provided  Uncontrolled hypertension  - Continue home regimen on discharge.  Refills provided  Visual field defect  - secondary to new onset stroke opthalmology referral  Follow up in 6 weeks for DM check    The patient was given clear instructions to go to ER or return to medical center if symptoms don't improve, worsen or new problems develop. The patient verbalized understanding. The patient was told to call to get any lab results if not heard anything in the next week.

## 2014-07-16 NOTE — Progress Notes (Signed)
Patient presents with wife for TCC visit HFU for CVA Ambulates with cane C/o right leg pain for 2-3 days; rates 3-4/10 at present

## 2014-07-19 ENCOUNTER — Other Ambulatory Visit: Payer: Medicare Other

## 2014-07-19 ENCOUNTER — Ambulatory Visit: Payer: Medicare Other

## 2014-07-20 ENCOUNTER — Ambulatory Visit
Admit: 2014-07-20 | Discharge: 2014-07-20 | Disposition: A | Discharge status: Home / Self Care | Payer: Medicare Other | Attending: Family | Admitting: Family

## 2014-07-20 ENCOUNTER — Ambulatory Visit (HOSPITAL_BASED_OUTPATIENT_CLINIC_OR_DEPARTMENT_OTHER): Payer: Medicare Other

## 2014-07-20 VITALS — BP 129/58 | HR 69 | Temp 98.1°F | Resp 19

## 2014-07-20 DIAGNOSIS — D471 Chronic myeloproliferative disease: Secondary | ICD-10-CM

## 2014-07-20 DIAGNOSIS — R42 Dizziness and giddiness: Secondary | ICD-10-CM

## 2014-07-20 DIAGNOSIS — D45 Polycythemia vera: Secondary | ICD-10-CM

## 2014-07-20 NOTE — Progress Notes (Signed)
Patient discharged ambulatory in no acute distress after 30 mins observation.

## 2014-07-20 NOTE — Patient Instructions (Signed)

## 2014-07-20 NOTE — Progress Notes (Signed)
Phlebotomy performed from 1455-1505, using 16G phlebotomy set to right [facility] without difficulty. Patient tolerated well, given snacks before procedure and after.

## 2014-07-22 ENCOUNTER — Ambulatory Visit (INDEPENDENT_AMBULATORY_CARE_PROVIDER_SITE_OTHER): Payer: Medicare Other | Admitting: *Deleted

## 2014-07-22 DIAGNOSIS — I739 Peripheral vascular disease, unspecified: Secondary | ICD-10-CM

## 2014-07-23 LAB — MDC_IDC_ENUM_SESS_TYPE_INCLINIC
Date Time Interrogation Session: 20151015150107
Zone Setting Detection Interval: 2000 ms
Zone Setting Detection Interval: 3000 ms
Zone Setting Detection Interval: 380 ms

## 2014-07-27 LAB — HM DIABETES EYE EXAM

## 2014-07-28 ENCOUNTER — Ambulatory Visit (HOSPITAL_BASED_OUTPATIENT_CLINIC_OR_DEPARTMENT_OTHER): Payer: Medicare Other | Admitting: Hematology

## 2014-07-28 ENCOUNTER — Ambulatory Visit (HOSPITAL_BASED_OUTPATIENT_CLINIC_OR_DEPARTMENT_OTHER): Payer: Medicare Other

## 2014-07-28 ENCOUNTER — Other Ambulatory Visit (HOSPITAL_BASED_OUTPATIENT_CLINIC_OR_DEPARTMENT_OTHER): Payer: Medicare Other

## 2014-07-28 ENCOUNTER — Telehealth: Payer: Self-pay | Admitting: Hematology

## 2014-07-28 ENCOUNTER — Other Ambulatory Visit: Payer: Self-pay | Admitting: *Deleted

## 2014-07-28 ENCOUNTER — Telehealth: Payer: Self-pay | Admitting: *Deleted

## 2014-07-28 VITALS — BP 131/56 | HR 71 | Resp 16

## 2014-07-28 VITALS — BP 174/82 | HR 62 | Temp 98.0°F | Resp 18 | Ht 67.0 in | Wt 189.0 lb

## 2014-07-28 DIAGNOSIS — D45 Polycythemia vera: Secondary | ICD-10-CM

## 2014-07-28 DIAGNOSIS — Z8739 Personal history of other diseases of the musculoskeletal system and connective tissue: Secondary | ICD-10-CM

## 2014-07-28 DIAGNOSIS — Z862 Personal history of diseases of the blood and blood-forming organs and certain disorders involving the immune mechanism: Secondary | ICD-10-CM

## 2014-07-28 DIAGNOSIS — D751 Secondary polycythemia: Secondary | ICD-10-CM

## 2014-07-28 DIAGNOSIS — G4700 Insomnia, unspecified: Secondary | ICD-10-CM | POA: Diagnosis not present

## 2014-07-28 DIAGNOSIS — I2510 Atherosclerotic heart disease of native coronary artery without angina pectoris: Secondary | ICD-10-CM | POA: Diagnosis not present

## 2014-07-28 DIAGNOSIS — I10 Essential (primary) hypertension: Secondary | ICD-10-CM

## 2014-07-28 LAB — CBC WITH DIFFERENTIAL/PLATELET
BASO%: 1.3 % (ref 0.0–2.0)
Basophils Absolute: 0.2 10*3/uL — ABNORMAL HIGH (ref 0.0–0.1)
EOS%: 2.4 % (ref 0.0–7.0)
Eosinophils Absolute: 0.3 10*3/uL (ref 0.0–0.5)
HCT: 48.9 % (ref 38.4–49.9)
HGB: 15.1 g/dL (ref 13.0–17.1)
LYMPH%: 11.6 % — ABNORMAL LOW (ref 14.0–49.0)
MCH: 26.5 pg — ABNORMAL LOW (ref 27.2–33.4)
MCHC: 30.9 g/dL — ABNORMAL LOW (ref 32.0–36.0)
MCV: 85.9 fL (ref 79.3–98.0)
MONO#: 0.7 10*3/uL (ref 0.1–0.9)
MONO%: 5.4 % (ref 0.0–14.0)
NEUT#: 10.1 10*3/uL — ABNORMAL HIGH (ref 1.5–6.5)
NEUT%: 79.3 % — ABNORMAL HIGH (ref 39.0–75.0)
Platelets: 440 10*3/uL — ABNORMAL HIGH (ref 140–400)
RBC: 5.69 10*6/uL (ref 4.20–5.82)
RDW: 21.7 % — ABNORMAL HIGH (ref 11.0–14.6)
WBC: 12.7 10*3/uL — ABNORMAL HIGH (ref 4.0–10.3)
lymph#: 1.5 10*3/uL (ref 0.9–3.3)
nRBC: 0 % (ref 0–0)

## 2014-07-28 LAB — BASIC METABOLIC PANEL
BUN: 10 mg/dL (ref 6–23)
CO2: 29 mEq/L (ref 19–32)
Calcium: 9.7 mg/dL (ref 8.4–10.5)
Chloride: 104 mEq/L (ref 96–112)
Creatinine, Ser: 0.93 mg/dL (ref 0.50–1.35)
Glucose, Bld: 135 mg/dL — ABNORMAL HIGH (ref 70–99)
Potassium: 5.8 mEq/L — ABNORMAL HIGH (ref 3.5–5.3)
Sodium: 141 mEq/L (ref 135–145)

## 2014-07-28 MED ORDER — LORAZEPAM 0.5 MG PO TABS: 0.5000 mg | ORAL_TABLET | Freq: Three times a day (TID) | ORAL | Status: DC

## 2014-07-28 MED ORDER — SODIUM POLYSTYRENE SULFONATE PO POWD: ORAL | Status: DC

## 2014-07-28 NOTE — Progress Notes (Signed)
Phlebotomy performed via right AC from 1010 to 1015.  550 cc removed.  Pt tolerated well.  Vitals stable.  Pt given food and drink, observed for 30 minutes post procedure.

## 2014-07-28 NOTE — Patient Instructions (Signed)

## 2014-07-28 NOTE — Telephone Encounter (Signed)
Per staff message and POF I have scheduled appts. Advised scheduler of appts. JMW  

## 2014-08-01 ENCOUNTER — Encounter: Payer: Self-pay | Admitting: Hematology

## 2014-08-01 NOTE — Progress Notes (Signed)
[place] HEMATOLOGY OFFICE PROGRESS NOTE Date of Visit: 07/28/2014  Tyler Copa, MD [phone] [address], [address] [place] [address]  DIAGNOSIS: Myelofibrosis/Myeloproliferative disorder (Overlap). Today he is symptomatic with Polycythemia. JAK2+.  Chief Complaint  Patient presents with  . Follow-up    CURRENT TREATMENT:    1.Hydrea 500 mg twice daily. 2. Aspirin daily. 3. Therapeutic Phlebotomy every 2 weeks starting today if HCT>45.     Myelofibrosis   12/26/2012 Bone Marrow Biopsy Showed hypercellularity with reticulum fibrosis, JAK-2 mutation positive, BCR/ABL negative, 24 XY in 20 metaphase, and Intermediate-1 ,DIPSS of 2. (WBC < 25; Hbg > 10;    01/02/2013 Imaging US abdomen complete revealed Mild splenogmealy (13.3 cm) and a 2.2 x 1.7 x 1.8 cm left renal cyst   01/15/2013 Pathology Results Integrated hematopathology report.  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    03/17/2013 Imaging CT Chest: Crowded peribronchial markings noted, associated with linear densities involving the left base, probably representing areas of platelike atelectasis/scarring.  No mass or infiltrate seen.    03/19/2013 - 07/20/2013 Chemotherapy Started Jakafi 20 mg bid. Dose reduced due to anemia.  Stopped due to increasing fatigue while on jakafi.    04/28/2013 - 05/01/2013 Hospital Admission Admitted to [facility] by Dr. Francia Whitehead, his cardiologist, due to the fact that he had midsternal chest pain.  He had a coronary stent placed this admission.    05/28/2013 - 05/29/2013 Hospital Admission Admitted to [facility] and received 3 units of blood (Dr. Lendon Whitehead note).    06/10/2013 Surgery Cololonoscopy due to GIB.  C/w diverticular disease and hemorrhoids but no active bleeding.    10/05/2013 -  Chemotherapy Started Hydrea 500  mg daily.   03/19/2014 -  Chemotherapy Hydrea increased to 500 mg bid wbc 17, hb 17, hct 54, plt 437   06/29/2014 Procedure cbc shows wbc 18.8 hb 18.7 hct 58.9 plt 456. ?compliance w hydrea as MCV not elevated. symptomatic start TP weekly x 4   07/28/2014 Procedure TP every 2 weeks if hct > 45     INTERVAL HISTORY:  Tyler Whitehead 73 y.o. male who was referred for further evaluation by Dr. Sheryn Whitehead for his primary myelofibrosis and was seen on 11/24/2011 and again on 03/19/2014 by Dr Tyler Whitehead. I saw him on 06/29/2014.  He ambulates with a cane as needed.He endorses early satiety and mild fatigue. He denies any hematochezia or melana. His activities of daily living are performed without difficulty. He denies any kidney problems or chest pain or discomfort. He complains of right leg pain following increased ambulation.  He is having some blurring of vision and floaters in eyes, eye exam was normal like because of thick blood. Blood sugars also run 200-300 range. He complains of decreased appetite. He does c/o Insomnia today and I gave him a Lorazepam script. He has decreased appetite. C/o mild constipation leading to mild discomfort in stomach.  CBC 07/28/14 shows wbc 12.7 hb 15.1 hct 48.9 plat 440  Previous hct   10/3 46 10/2 49 9/30 53  9/28 55 9/23 55 9/1 58  Chemistry  Na 141 k 5.8 cl 104 hco3 29 bun 10 cr 0.9 glu 135  MEDICAL HISTORY: Past Medical History  Diagnosis Date  . Primary myelofibrosis   . Diverticular disease   . Hemorrhoid   . CAD (coronary artery disease)     a. S/p  3V CABG 2000 with LIMA-LAD, left radial-PDA, SVG-OM1. b. 2014: stent to SVG-OM1; then Whitehead to have obstruction distal to LAD anastamosis of LIMA s/p DES.   Marland Kitchen Peripheral vascular disease     a. R femoropopliteal bypass ~2012 in [place], reportedly shown to be occluded. Now followed by Tyler Whitehead.  . DM (diabetes mellitus)   . Hyperlipidemia   . HTN (hypertension)   . RBBB     INTERIM HISTORY: has Myelofibrosis;  Diverticular disease; Hemorrhoids; S/P CABG x 3; CAD (coronary artery disease); Peripheral vascular disease; DM2 (diabetes mellitus, type 2); Renal cyst, left; Splenomegaly; HTN (hypertension); Dyslipidemia; Headache; New onset seizure; Diabetic hyperosmolar non-ketotic state; Uncontrolled hypertension; Polycythemia vera; Seizure; Visual field defect; and Cerebral thrombosis with cerebral infarction on his problem list.    ALLERGIES:  has No Known Allergies.  MEDICATIONS: has a current medication list which includes the following prescription(s): aspirin, atorvastatin, carvedilol, cholecalciferol, clopidogrel, diphenhydramine-acetaminophen, fluticasone, hydroxyurea, insulin glargine, isosorbide mononitrate, freestyle, levetiracetam, linagliptin-metformin hcl, lisinopril, metoclopramide, nitrostat, proair hfa, lorazepam, and sodium polystyrene.  SURGICAL HISTORY:  Past Surgical History  Procedure Laterality Date  . Coronary artery bypass graft  2000    LM occluded, LIMA to LAD patent but distal native LAD disease, saphenous vein bypass graft to OM with 95% stenosis in the midportion, right coronary artery occluded, radial artery graft to right coronary artery patent but was diffusely small. Marginal graft stented. 7 2014. Of note he had multiple previous stents in the right coronary artery. The distal LAD was treated with angioplasty. I  . Cardiac catheterization  May 2014  . Percutaneous coronary stent intervention (pci-s)  April 27 2013  . Vein surgery    . Tee without cardioversion N/A 07/12/2014    Procedure: TRANSESOPHAGEAL ECHOCARDIOGRAM (TEE);  Surgeon: Tyler Records, MD;  Location: [place];  Service: Cardiovascular;  Laterality: N/A;    REVIEW OF SYSTEMS:   Constitutional: Denies fevers, chills or abnormal weight loss Eyes: Admits to blurriness of vision and floaters Ears, nose, mouth, throat, and face: Denies mucositis or sore throat Respiratory: Denies cough, dyspnea or  wheezes Cardiovascular: Denies palpitation, chest discomfort or lower extremity swelling Gastrointestinal:  Denies nausea, heartburn or change in bowel habits Skin: Denies abnormal skin rashes Lymphatics: Denies new lymphadenopathy or easy bruising Neurological:Denies numbness, tingling or new weaknesses Behavioral/Psych: Mood is stable, no new changes  All other systems were reviewed with the patient and are negative.  PHYSICAL EXAMINATION: ECOG PERFORMANCE STATUS: 0  Blood pressure 174/82, pulse 62, temperature 98 F (36.7 C), temperature source Oral, resp. rate 18, height _0  (1.702 m), weight 189 lb (85.73 kg).  GENERAL:alert, no distress and comfortable; elderly male who appears his stated age.  SKIN: skin color, texture, turgor are normal, no rashes or significant lesions EYES: normal, Conjunctiva are pink and non-injected, sclera clear OROPHARYNX:no exudate, no erythema and lips, buccal mucosa, and tongue normal  NECK: supple, thyroid normal size, non-tender, without nodularity LYMPH:  no palpable lymphadenopathy in the cervical, axillary or supraclavicular LUNGS: clear to auscultation with normal breathing effort, no wheezes or rhonchi HEART: regular rate & rhythm and no murmurs and no lower extremity edema ABDOMEN:abdomen soft, non-tender and normal bowel sounds Musculoskeletal:no cyanosis of digits and no clubbing  NEURO: alert & oriented x 3 with fluent speech, no focal motor/sensory deficits  Labs:  Lab Results  Component Value Date   WBC 12.7* 07/28/2014   HGB 15.1 07/28/2014   HCT 48.9 07/28/2014   MCV 85.9 07/28/2014  PLT 440* 07/28/2014   NEUTROABS 10.1* 07/28/2014      Chemistry      Component Value Date/Time   NA 141 07/28/2014 0827   NA 141 03/19/2014 0821   K 5.8* 07/28/2014 0827   K 4.5 03/19/2014 0821   CL 104 07/28/2014 0827   CO2 29 07/28/2014 0827   CO2 28 03/19/2014 0821   BUN 10 07/28/2014 0827   BUN 15.1 03/19/2014 0821   CREATININE 0.93  07/28/2014 0827   CREATININE 0.9 03/19/2014 0821      Component Value Date/Time   CALCIUM 9.7 07/28/2014 0827   CALCIUM 9.3 03/19/2014 0821   ALKPHOS 121* 07/09/2014 2000   ALKPHOS 104 11/23/2013 1030   AST 14 07/09/2014 2000   AST 17 11/23/2013 1030   ALT 12 07/09/2014 2000   ALT 16 11/23/2013 1030   BILITOT 1.0 07/09/2014 2000   BILITOT 0.67 11/23/2013 1030       RADIOGRAPHIC STUDIES: No results Whitehead.  ASSESSMENT: Tyler Whitehead 74 y.o. male with a history of Myelofibrosis, jak 2 mutation and symptomatic polycythemia. Wbc and platelets are high as well. He told nursing staff that not taking medications and want to cut down more medications, MCV normal a sign not taking hydrea and when i confronted him he said instead of taking twice a day he takes all at one time. I suspect Non-compliance of hydrea.  PLAN:   1. Primary Myeloproliferative neoplasm (Myelofibrosis).  --Clinically, he is doing ok but he reports taking hydrea twice daily.  He has been  instructed him to increase this to  hydrea 500 mg bid. He was JAK2 positive but did not tolerate Jakafi due to worsening fatigue and anemia.  His plts today are 440 up from 326 553 on 01/18/14. His hemoglobin is 15.1 doen from 18.7. His WBCs are 12.7 down from 18.8 down 22.4 . He reports increased energy. His creatinine is within normal limits. Serum potassium is high which we treated with Kayexalate 60 gm. --We reviewed his imaging and bone marrow biopsy consistent with primary myelofibrosis intermediate-I, DIPSS of (Age greater than 102, WBC less than 25, hbg greater than 10). Indications to treat was to lower plts less than 400 to reduce chances of clots including CVA or DVT or MIs. Hydroxyurea may result in a reduction in spleen size, control of thrombocytosis and leukocytosis, and/or control of constitutional symptoms. People with positive JAK2 respond more favorable to hydrea. These benefits must be balanced with the risks associated with hydrea  including ulcers,myelosuppression, etc. Understanding these risks, he choose to continue with his hydrea 500 mg bid. He will have his complete blood count monitored closely while on therapy for titration.  --As he was symptomatic with polycythemia, I instituted weekly TP X 4 WEEKS on last visit which has helped his counts to come down and now I will change the frequency of TP to every other week (for hematocrit trigger of 45 or greater) and repeat his CBC and do a clinic visit in 8 weeks. He starts TP today. EMPHASIZED TO HIM ABOUT TAKING HYDREA AND ASPIRIN AS OTHERWISE HE PUTS HIMSELF TO RISK OF CARDIOVASCULAR EVENTS LIKE DVT, TIA,CVA, MI etc  --His normal MCV and medication history suggests to me he does not take hydrea as prescribed.  2. CADz s/p CABG x 3, hypertension  --Continue lisinopril, coreg, aspirin   3. PvDz.  --Continue Imdur as it appearing to have improved some of his claudication symptoms.   4. Dyslipidemia.  --Continue statin  therapy.   5. Insomnia. -- Gave him a lorazepam script.  6. Follow-up.  --Patient will have labs drawn every 2 weeks and return for symptom follow visit in 2 month with chemistries and cbc.   All questions were answered. The patient knows to call the clinic with any problems, questions or concerns. We can certainly see the patient much sooner if necessary.  I spent 15 minutes counseling the patient face to face. The total time spent in the appointment was 20 minutes.    Bernadene Bell, MD Medical Hematologist/Oncologist [place] Pager: [phone] Office No: [phone]

## 2014-08-02 ENCOUNTER — Ambulatory Visit (INDEPENDENT_AMBULATORY_CARE_PROVIDER_SITE_OTHER): Payer: Medicare Other | Admitting: Neurology

## 2014-08-02 ENCOUNTER — Encounter: Payer: Self-pay | Admitting: Neurology

## 2014-08-02 VITALS — BP 148/78 | HR 80 | Resp 16 | Ht 67.0 in | Wt 188.0 lb

## 2014-08-02 DIAGNOSIS — I639 Cerebral infarction, unspecified: Secondary | ICD-10-CM

## 2014-08-02 DIAGNOSIS — G40209 Localization-related (focal) (partial) symptomatic epilepsy and epileptic syndromes with complex partial seizures, not intractable, without status epilepticus: Secondary | ICD-10-CM

## 2014-08-02 NOTE — Patient Instructions (Signed)
1. Continue Keppra 500mg  twice a day 2. Continue daily aspirin and Plavix 3. Keep a calendar of your symptoms 4. If you have a change in symptoms, go to ER immediately 5. Follow-up in 1 month

## 2014-08-02 NOTE — Progress Notes (Signed)
NEUROLOGY CONSULTATION NOTE  BYAN POPLASKI MRN: [id number] DOB: 17-Aug-1941  Referring provider: Eloise Levels, NP Primary care provider: Dr. Aura Dials  Reason for consult:  Recent stroke and seizure, hospital follow-up   Thank you for your kind referral of HEAVEN WANDELL for consultation of the above symptoms. Although his history is well known to you, please allow me to reiterate it for the purpose of our medical record. The patient was accompanied to the clinic by his wife who also provides collateral information. Records and images were personally reviewed where available.  HISTORY OF PRESENT ILLNESS: This is a pleasant 73 year old right-handed man with a history of hypertension, hyperlipidemia, diabetes, CAD s/p CABG and PTCA, peripheral vascular disease s/p fempop bypass, myelofibrosis/myeloproliferative disorder with polycythemia, presenting after recent hospital admission from October 2-6, 2015 for new onset seizure and found to have an acute left occipital stroke. He reports that he started having headaches around a month prior, with dull pain over the left frontal region. He went to [facility] on 06/30/14 after his Cardiology and Oncology visits, head CT normal, headache improved with narcotics. He had reported seeing spots in his vision and was scheduled to see an ophthalmologist. He had a normal exam and returned to the ER on 09/28 again for constant left frontal headaches that progressively worsened. He had some nausea and vomiting a few days prior. He was evaluated by Neurology with normal exam. ESR and CRP negative. Headache resolved with dilaudid, Ativan, Reglan, and morphine, and he was discharged home on steroid taper to prevent rebound headaches. He again returned to the ER on 09/30 with the same headache, exam non-focal. He had intermittent blurred vision but appeared chronic. He was given a cocktail again with resolution of headaches.  On 07/09/14, his wife noticed that  throughout the day, he kept looking to the right side. He reported inability to see on the right side. At dinner time, he recalls sitting at the table eating, he recalls seeing lights in his right visual field and little black spots like ants, then woke up in the hospital. His wife reported that he got up, then his mouth drooped, his right hand started shaking, then he started having a GTC and fell to the floor. The seizure lasted several minutes. He was initially confused on arrival to the ER then subsequently returned to normal, with initial neurological exam showing dense right homonymous hemianopia.  He denied any focal numbness, tingling, or weakness. I personally reviewed MRI brain which showed an acute left occipital infarct. MRA showed intracranial atherosclerosis without flow-reducing lesion, unremarkable extracranial MRA. He had been taking aspirin and Plavix since stent placement a year ago, this was not changed during his hospitalization. He had a TTE and TEE which showed mild fixed plaque in the thoracic aorta.  He had an implantable loop recorder placed. He does report occasional palpitations, no chest pains. Routine EEG normal. He reports that for a week after hospital discharge, he continued to see flashing lights and black spots in his right visual field, this gradually improved, and he currently denies any abnormality in his vision. He denies any further headaches.  He occasionally feels something gets stuck on the left side of his chest when swallowing. He was started on Keppra 539m BID with no side effects except mild drowsiness, no mood changes. He does note visual hallucinations where he would see a video in his head of something that happened 6 months ago. He reports that "stuff  keeps popping back up in my head."  He denies any olfactory/gustatory hallucinations, deja vu, rising epigastric sensation, focal numbness/tingling/weakness, myoclonic jerks.  Epilepsy Risk Factors:  Left occipital  stroke. Otherwise he had a normal birth and early development.  There is no history of febrile convulsions, CNS infections such as meningitis/encephalitis, significant traumatic brain injury, neurosurgical procedures, or family history of seizures.  Laboratory Data:  Lab Results  Component Value Date   WBC 12.7* 07/28/2014   HGB 15.1 07/28/2014   HCT 48.9 07/28/2014   MCV 85.9 07/28/2014   PLT 440* 07/28/2014     Chemistry      Component Value Date/Time   NA 141 07/28/2014 0827   NA 141 03/19/2014 0821   K 5.8* 07/28/2014 0827   K 4.5 03/19/2014 0821   CL 104 07/28/2014 0827   CO2 29 07/28/2014 0827   CO2 28 03/19/2014 0821   BUN 10 07/28/2014 0827   BUN 15.1 03/19/2014 0821   CREATININE 0.93 07/28/2014 0827   CREATININE 0.9 03/19/2014 0821      Component Value Date/Time   CALCIUM 9.7 07/28/2014 0827   CALCIUM 9.3 03/19/2014 0821   ALKPHOS 121* 07/09/2014 2000   ALKPHOS 104 11/23/2013 1030   AST 14 07/09/2014 2000   AST 17 11/23/2013 1030   ALT 12 07/09/2014 2000   ALT 16 11/23/2013 1030   BILITOT 1.0 07/09/2014 2000   BILITOT 0.67 11/23/2013 1030       PAST MEDICAL HISTORY: Past Medical History  Diagnosis Date  . Primary myelofibrosis   . Diverticular disease   . Hemorrhoid   . CAD (coronary artery disease)     a. S/p 3V CABG 2000 with LIMA-LAD, left radial-PDA, SVG-OM1. b. 2014: stent to SVG-OM1; then found to have obstruction distal to LAD anastamosis of LIMA s/p DES.   Marland Kitchen Peripheral vascular disease     a. R femoropopliteal bypass ~2012 in [place], reportedly shown to be occluded. Now followed by Dr. [name].  . DM (diabetes mellitus)   . Hyperlipidemia   . HTN (hypertension)   . RBBB     PAST SURGICAL HISTORY: Past Surgical History  Procedure Laterality Date  . Coronary artery bypass graft  2000    LM occluded, LIMA to LAD patent but distal native LAD disease, saphenous vein bypass graft to OM with 95% stenosis in the midportion, right coronary artery occluded, radial  artery graft to right coronary artery patent but was diffusely small. Marginal graft stented. 7 2014. Of note he had multiple previous stents in the right coronary artery. The distal LAD was treated with angioplasty. I  . Cardiac catheterization  May 2014  . Percutaneous coronary stent intervention (pci-s)  April 27 2013  . Vein surgery    . Tee without cardioversion N/A 07/12/2014    Procedure: TRANSESOPHAGEAL ECHOCARDIOGRAM (TEE);  Surgeon: Fay Records, MD;  Location: [facility] ENDOSCOPY;  Service: Cardiovascular;  Laterality: N/A;    MEDICATIONS: Current Outpatient Prescriptions on File Prior to Visit  Medication Sig Dispense Refill  . aspirin 81 MG tablet Take 81 mg by mouth daily.      Marland Kitchen atorvastatin (LIPITOR) 40 MG tablet Take 1.5 tablets (60 mg total) by mouth daily.  135 tablet  3  . carvedilol (COREG) 6.25 MG tablet Take 1 tablet (6.25 mg total) by mouth 2 (two) times daily with a meal.  60 tablet  5  . cholecalciferol (VITAMIN D) 1000 UNITS tablet Take 1,000 Units by mouth daily.      Marland Kitchen  clopidogrel (PLAVIX) 75 MG tablet Take 1 tablet (75 mg total) by mouth daily with breakfast.  30 tablet  6  . diphenhydramine-acetaminophen (TYLENOL PM) 25-500 MG TABS Take 1 tablet by mouth at bedtime as needed (sleep/pain).      . fluticasone (FLONASE) 50 MCG/ACT nasal spray Place 1 spray into both nostrils daily.  16 g  6  . hydroxyurea (HYDREA) 500 MG capsule Take 1 capsule (500 mg total) by mouth 2 (two) times daily.  60 capsule  3  . Insulin Glargine (LANTUS SOLOSTAR) 100 UNIT/ML Solostar Pen Inject 10 Units into the skin 2 (two) times daily.  15 mL  12  . isosorbide mononitrate (IMDUR) 30 MG 24 hr tablet Take 30 mg by mouth daily.      Marland Kitchen levETIRAcetam (KEPPRA) 500 MG tablet Take 1 tablet (500 mg total) by mouth 2 (two) times daily.  60 tablet  6  . Linagliptin-Metformin HCl 2.5-500 MG TABS Take 1 tablet by mouth 2 (two) times daily.      Marland Kitchen lisinopril (PRINIVIL,ZESTRIL) 20 MG tablet Take 1 tablet (20 mg  total) by mouth daily.  30 tablet  2  . LORazepam (ATIVAN) 0.5 MG tablet Take 1 tablet (0.5 mg total) by mouth every 8 (eight) hours.  30 tablet  5  . metoCLOPramide (REGLAN) 10 MG tablet Take 1 tablet (10 mg total) by mouth every 8 (eight) hours as needed for nausea.  90 tablet  1  . PROAIR HFA 108 (90 BASE) MCG/ACT inhaler Inhale 1 puff into the lungs every 4 (four) hours as needed for wheezing or shortness of breath.       Marland Kitchen NITROSTAT 0.4 MG SL tablet Place 0.4 mg under the tongue every 5 (five) minutes as needed for chest pain.        No current facility-administered medications on file prior to visit.    ALLERGIES: No Known Allergies  FAMILY HISTORY: Family History  Problem Relation Age of Onset  . Hyperlipidemia Maternal Grandmother   . Asthma Father   . Heart disease Father   . CAD      Multiple siblings    SOCIAL HISTORY: History   Social History  . Marital Status: Married    Spouse Name: N/A    Number of Children: N/A  . Years of Education: N/A   Occupational History  . Limo driver    Social History Main Topics  . Smoking status: Never Smoker   . Smokeless tobacco: Never Used  . Alcohol Use: No  . Drug Use: No  . Sexual Activity: Not on file   Other Topics Concern  . Not on file   Social History Narrative   Lives with wife.     REVIEW OF SYSTEMS: Constitutional: No fevers, chills, or sweats, no generalized fatigue, change in appetite Eyes: No visual changes, double vision, eye pain Ear, nose and throat: No hearing loss, ear pain, nasal congestion, sore throat Cardiovascular: No chest pain, palpitations Respiratory:  No shortness of breath at rest or with exertion, wheezes GastrointestinaI: No nausea, vomiting, diarrhea, abdominal pain, fecal incontinence Genitourinary:  No dysuria, urinary retention or frequency Musculoskeletal:  No neck pain, back pain Integumentary: No rash, pruritus, skin lesions Neurological: as above Psychiatric: No depression,  insomnia, anxiety Endocrine: No palpitations, fatigue, diaphoresis, mood swings, change in appetite, change in weight, increased thirst Hematologic/Lymphatic:  No anemia, purpura, petechiae. Allergic/Immunologic: no itchy/runny eyes, nasal congestion, recent allergic reactions, rashes  PHYSICAL EXAM: Filed Vitals:   08/02/14 1057  BP: 148/78  Pulse: 80  Resp: 16   General: No acute distress Head:  Normocephalic/atraumatic Eyes: Fundoscopic exam shows bilateral sharp discs, no vessel changes, exudates, or hemorrhages Neck: supple, no paraspinal tenderness, full range of motion Back: No paraspinal tenderness Heart: irregular rhythm Lungs: Clear to auscultation bilaterally. Vascular: No carotid bruits. Skin/Extremities: No rash, no edema Neurological Exam: Mental status: alert and oriented to person, place, and time, no dysarthria or aphasia, Fund of knowledge is appropriate.  Recent and remote memory are intact.  Attention and concentration are normal.    Able to name objects and repeat phrases. Cranial nerves: CN I: not tested CN II: pupils equal, round and reactive to light, visual fields intact (no homonymous hemianopia on exam today), fundi unremarkable. CN III, IV, VI:  full range of motion, no nystagmus, no ptosis CN V: facial sensation intact CN VII: upper and lower face symmetric CN VIII: hearing intact to finger rub CN IX, X: gag intact, uvula midline CN XI: sternocleidomastoid and trapezius muscles intact CN XII: tongue midline Bulk & Tone: normal, no fasciculations. Motor: 5/5 throughout with no pronator drift. Sensation: decreased pin, cold, and vibration in stocking distribution up to ankles bilaterally, intact to light touch, cold, pin on both UE, intact to joint position sense.  No extinction to double simultaneous stimulation.  Romberg test negative Deep Tendon Reflexes: +2 throughout except for absent ankle jerks, no ankle clonus Plantar responses: downgoing  bilaterally Cerebellar: no incoordination on finger to nose, heel to shin. No dysdiadochokinesia Gait: slow and cautious, unable to tandem walk Tremor: none  IMPRESSION: This is a pleasant 73 year old right-handed man with a vascular risk factors including hypertension, hyperlipidemia, diabetes, CAD s/p CABG and PTCA, peripheral vascular disease s/p fempop bypass, myelofibrosis/myeloproliferative disorder with polycythemia, who presented with new onset seizure secondary to acute left occipital infarct. Stroke workup unremarkable, he currently has a loop recorder. He was discharged home on continued aspirin and Plavix, with follow-up with Cardiology. He may benefit from anticoagulation for stroke prevention if there is evidence of Afib on loop recorder. Continue control of other vascular risk factors. Continue Keppra 587m BID for seizure prophylaxis. If visual symptoms recur, we will plan to increase dose to 7528mBID. [place] laws were discussed with the patient, and he knows to stop driving after a seizure, until 6 months seizure-free. We discussed symptoms to monitor, if stroke-like symptoms recur, he knows to go to the ER immediately. He will follow-up in 1 month.  Thank you for allowing me to participate in the care of this patient. Please do not hesitate to call for any questions or concerns.   KaEllouise NewerM.D.  CC: Dr. ThSheryn Bison

## 2014-08-04 ENCOUNTER — Encounter: Payer: Self-pay | Admitting: Neurology

## 2014-08-04 DIAGNOSIS — I639 Cerebral infarction, unspecified: Secondary | ICD-10-CM | POA: Insufficient documentation

## 2014-08-04 DIAGNOSIS — G40209 Localization-related (focal) (partial) symptomatic epilepsy and epileptic syndromes with complex partial seizures, not intractable, without status epilepticus: Secondary | ICD-10-CM | POA: Insufficient documentation

## 2014-08-05 NOTE — Progress Notes (Signed)
Done

## 2014-08-11 ENCOUNTER — Ambulatory Visit: Payer: Medicare Other

## 2014-08-11 ENCOUNTER — Ambulatory Visit (INDEPENDENT_AMBULATORY_CARE_PROVIDER_SITE_OTHER): Payer: Medicare Other | Admitting: *Deleted

## 2014-08-11 ENCOUNTER — Other Ambulatory Visit: Payer: Medicare Other

## 2014-08-11 ENCOUNTER — Other Ambulatory Visit (HOSPITAL_BASED_OUTPATIENT_CLINIC_OR_DEPARTMENT_OTHER): Payer: Medicare Other

## 2014-08-11 DIAGNOSIS — I6330 Cerebral infarction due to thrombosis of unspecified cerebral artery: Secondary | ICD-10-CM

## 2014-08-11 DIAGNOSIS — D751 Secondary polycythemia: Secondary | ICD-10-CM

## 2014-08-11 DIAGNOSIS — D471 Chronic myeloproliferative disease: Secondary | ICD-10-CM | POA: Diagnosis not present

## 2014-08-11 DIAGNOSIS — D45 Polycythemia vera: Secondary | ICD-10-CM

## 2014-08-11 LAB — CBC WITH DIFFERENTIAL/PLATELET
BASO%: 1.1 % (ref 0.0–2.0)
Basophils Absolute: 0.2 10*3/uL — ABNORMAL HIGH (ref 0.0–0.1)
EOS%: 2.1 % (ref 0.0–7.0)
Eosinophils Absolute: 0.4 10*3/uL (ref 0.0–0.5)
HCT: 46.9 % (ref 38.4–49.9)
HGB: 14.3 g/dL (ref 13.0–17.1)
LYMPH%: 7.8 % — ABNORMAL LOW (ref 14.0–49.0)
MCH: 25.8 pg — ABNORMAL LOW (ref 27.2–33.4)
MCHC: 30.4 g/dL — ABNORMAL LOW (ref 32.0–36.0)
MCV: 84.8 fL (ref 79.3–98.0)
MONO#: 0.7 10*3/uL (ref 0.1–0.9)
MONO%: 4.1 % (ref 0.0–14.0)
NEUT#: 15 10*3/uL — ABNORMAL HIGH (ref 1.5–6.5)
NEUT%: 84.9 % — ABNORMAL HIGH (ref 39.0–75.0)
Platelets: 305 10*3/uL (ref 140–400)
RBC: 5.53 10*6/uL (ref 4.20–5.82)
RDW: 23.1 % — ABNORMAL HIGH (ref 11.0–14.6)
WBC: 17.7 10*3/uL — ABNORMAL HIGH (ref 4.0–10.3)
lymph#: 1.4 10*3/uL (ref 0.9–3.3)

## 2014-08-11 NOTE — Progress Notes (Signed)
Tyler Whitehead presents today for phlebotomy per MD orders. Phlebotomy procedure started at 0850 and ended at 0910. 500 grams removed. Patient observed for 30 minutes after procedure without any incident. Patient tolerated procedure well. IV needle removed intact.

## 2014-08-11 NOTE — Patient Instructions (Signed)

## 2014-08-13 ENCOUNTER — Encounter: Payer: Self-pay | Admitting: Internal Medicine

## 2014-08-13 ENCOUNTER — Encounter: Payer: Self-pay | Admitting: Hematology

## 2014-08-13 NOTE — Progress Notes (Signed)
Called and spoke with patient. He will go and apply for medicaid. He needs secondary to help with prior [employer].

## 2014-08-17 NOTE — Progress Notes (Signed)
Loop recorder 

## 2014-08-22 LAB — MDC_IDC_ENUM_SESS_TYPE_REMOTE
Date Time Interrogation Session: 20151031160013
Zone Setting Detection Interval: 2000 ms
Zone Setting Detection Interval: 3000 ms
Zone Setting Detection Interval: 380 ms

## 2014-08-25 ENCOUNTER — Ambulatory Visit: Payer: Medicare Other

## 2014-08-25 ENCOUNTER — Other Ambulatory Visit (HOSPITAL_BASED_OUTPATIENT_CLINIC_OR_DEPARTMENT_OTHER): Payer: Medicare Other

## 2014-08-25 DIAGNOSIS — D751 Secondary polycythemia: Secondary | ICD-10-CM

## 2014-08-25 DIAGNOSIS — D7581 Myelofibrosis: Secondary | ICD-10-CM

## 2014-08-25 LAB — CBC WITH DIFFERENTIAL/PLATELET
BASO%: 0.3 % (ref 0.0–2.0)
Basophils Absolute: 0.1 10*3/uL (ref 0.0–0.1)
EOS%: 3.1 % (ref 0.0–7.0)
Eosinophils Absolute: 0.6 10*3/uL — ABNORMAL HIGH (ref 0.0–0.5)
HCT: 44.7 % (ref 38.4–49.9)
HGB: 13.7 g/dL (ref 13.0–17.1)
LYMPH%: 7.6 % — ABNORMAL LOW (ref 14.0–49.0)
MCH: 25.7 pg — ABNORMAL LOW (ref 27.2–33.4)
MCHC: 30.5 g/dL — ABNORMAL LOW (ref 32.0–36.0)
MCV: 84.3 fL (ref 79.3–98.0)
MONO#: 1.1 10*3/uL — ABNORMAL HIGH (ref 0.1–0.9)
MONO%: 5.3 % (ref 0.0–14.0)
NEUT#: 17.5 10*3/uL — ABNORMAL HIGH (ref 1.5–6.5)
NEUT%: 83.7 % — ABNORMAL HIGH (ref 39.0–75.0)
Platelets: 391 10*3/uL (ref 140–400)
RBC: 5.31 10*6/uL (ref 4.20–5.82)
RDW: 21.8 % — ABNORMAL HIGH (ref 11.0–14.6)
WBC: 20.9 10*3/uL — ABNORMAL HIGH (ref 4.0–10.3)
lymph#: 1.6 10*3/uL (ref 0.9–3.3)

## 2014-08-25 NOTE — Progress Notes (Signed)
Phlebotomy canceled.

## 2014-08-25 NOTE — Progress Notes (Signed)
Patient has HCT = 44.7 today; goal is HCT 45 or greater for therapeutic phlebotomy; no phlebotomy needed today; informed patient with copy of labs given; patient verbalizes understanding.

## 2014-09-01 ENCOUNTER — Encounter: Payer: Self-pay | Admitting: Neurology

## 2014-09-01 ENCOUNTER — Ambulatory Visit (INDEPENDENT_AMBULATORY_CARE_PROVIDER_SITE_OTHER): Payer: Medicare Other | Admitting: Neurology

## 2014-09-01 ENCOUNTER — Other Ambulatory Visit: Payer: Medicare Other

## 2014-09-01 VITALS — BP 104/66 | HR 88 | Resp 16 | Ht 67.0 in | Wt 188.0 lb

## 2014-09-01 DIAGNOSIS — G40209 Localization-related (focal) (partial) symptomatic epilepsy and epileptic syndromes with complex partial seizures, not intractable, without status epilepticus: Secondary | ICD-10-CM

## 2014-09-01 DIAGNOSIS — I639 Cerebral infarction, unspecified: Secondary | ICD-10-CM

## 2014-09-01 NOTE — Patient Instructions (Signed)
1. Continue Keppra 500mg  twice a day 2. Continue all your current medications 3. Speak to your Cardiologist about the irregular heartbeat  4. Go to ER immediately for any change in symptoms 5. Follow-up in 4-5 months

## 2014-09-05 ENCOUNTER — Encounter: Payer: Self-pay | Admitting: Neurology

## 2014-09-05 NOTE — Progress Notes (Signed)
NEUROLOGY FOLLOW UP OFFICE NOTE  Tyler Whitehead [id number]  HISTORY OF PRESENT ILLNESS: I had the pleasure of seeing Tyler Whitehead in follow-up in the neurology clinic on 09/01/2014.  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 He denies any further seizures or seizure-like symptoms on Keppra 512m BID. He denies any further visual changes on the right visual field since hospitalization. He denies any further headaches. He does feel tired on the Keppra, but also reports muscle soreness after a recent flu shot. He denies any diplopia, dysarthria, dysphagia, focal numbness/tingling/weakness.  HPI: This is a pleasant 73yo RH man with a history of hypertension, hyperlipidemia, diabetes, CAD s/p CABG and PTCA, peripheral vascular disease s/p fempop bypass, myelofibrosis/myeloproliferative disorder with polycythemia, with new onset seizure last 07/09/14. He reports that he started having headaches around a month prior, with dull pain over the left frontal region. [facility] after his Cardiology and Oncology visits, head CT normal, headache improved with narcotics. He had reported seeing spots in his vision and was scheduled to see an ophthalmologist. He had a normal exam and returned to the ER on 09/28 again for constant left frontal headaches that progressively worsened. He had some nausea and vomiting a few days prior. He was evaluated by Neurology with normal exam. ESR and CRP negative. Headache resolved with dilaudid, Ativan, Reglan, and morphine, and he was discharged home on steroid taper to prevent rebound headaches. He again returned to the ER on 09/30 with the same headache, exam non-focal. He had intermittent blurred vision but appeared chronic. He was given a cocktail again with resolution of headaches. On 07/09/14, his wife noticed that throughout the day, he kept looking to the right side. He reported inability to see on  the right side. At dinner time, he recalls sitting at the table eating, he recalls seeing lights in his right visual field and little black spots like ants, then woke up in the hospital. His wife reported that he got up, then his mouth drooped, his right hand started shaking, then he started having a GTC and fell to the floor. The seizure lasted several minutes. He was initially confused on arrival to the ER then subsequently returned to normal, with initial neurological exam showing dense right homonymous hemianopia. He denied any focal numbness, tingling, or weakness.   I personally reviewed MRI brain which showed an acute left occipital infarct. MRA showed intracranial atherosclerosis without flow-reducing lesion, unremarkable extracranial MRA. He had been taking aspirin and Plavix since stent placement a year ago, this was not changed during his hospitalization. He had a TTE and TEE which showed mild fixed plaque in the thoracic aorta. He had an implantable loop recorder placed. He does report occasional palpitations, no chest pains. Routine EEG normal. He reports that for a week after hospital discharge, he continued to see flashing lights and black spots in his right visual field, this gradually improved, and he currently denies any abnormality in his vision. He denies any further headaches. He occasionally feels something gets stuck on the left side of his chest when swallowing. He was started on Keppra 5062mBID with no side effects except mild drowsiness, no mood changes. He does note visual hallucinations where he would see a video in his head of something that happened 6 months ago. He reports that "stuff keeps popping back up in my head." He denies any olfactory/gustatory hallucinations, deja  vu, rising epigastric sensation, focal numbness/tingling/weakness, myoclonic jerks.  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 Otherwise he had a normal birth and early development. There is no history  of febrile convulsions, CNS infections such as meningitis/encephalitis, significant traumatic brain injury, neurosurgical procedures, or family history of seizures.  PAST MEDICAL HISTORY: Past Medical History  Diagnosis Date  . Primary myelofibrosis   . Diverticular disease   . Hemorrhoid   . CAD (coronary artery disease)     a. S/p 3V CABG 2000 with LIMA-LAD, left radial-PDA, SVG-OM1. b. 2014: stent to SVG-OM1; then found to have obstruction distal to LAD anastamosis of LIMA s/p DES.   Marland Kitchen Peripheral vascular disease     a. R femoropopliteal bypass ~2012 in [place], reportedly shown to be occluded. Now followed by Dr. [name].  . DM (diabetes mellitus)   . Hyperlipidemia   . HTN (hypertension)   . RBBB     MEDICATIONS: Current Outpatient Prescriptions on File Prior to Visit  Medication Sig Dispense Refill  . aspirin 81 MG tablet Take 81 mg by mouth daily.    Marland Kitchen atorvastatin (LIPITOR) 40 MG tablet Take 1.5 tablets (60 mg total) by mouth daily. 135 tablet 3  . carvedilol (COREG) 6.25 MG tablet Take 1 tablet (6.25 mg total) by mouth 2 (two) times daily with a meal. 60 tablet 5  . cholecalciferol (VITAMIN D) 1000 UNITS tablet Take 1,000 Units by mouth daily.    . clopidogrel (PLAVIX) 75 MG tablet Take 1 tablet (75 mg total) by mouth daily with breakfast. 30 tablet 6  . diphenhydramine-acetaminophen (TYLENOL PM) 25-500 MG TABS Take 1 tablet by mouth at bedtime as needed (sleep/pain).    . fluticasone (FLONASE) 50 MCG/ACT nasal spray Place 1 spray into both nostrils daily. 16 g 6  . hydroxyurea (HYDREA) 500 MG capsule Take 1 capsule (500 mg total) by mouth 2 (two) times daily. 60 capsule 3  . Insulin Glargine (LANTUS SOLOSTAR) 100 UNIT/ML Solostar Pen Inject 10 Units into the skin 2 (two) times daily. 15 mL 12  . isosorbide mononitrate (IMDUR) 30 MG 24 hr tablet Take 30 mg by mouth daily.    Marland Kitchen levETIRAcetam (KEPPRA) 500 MG tablet Take 1 tablet (500 mg total) by mouth 2 (two) times daily. 60 tablet 6    . Linagliptin-Metformin HCl 2.5-500 MG TABS Take 1 tablet by mouth 2 (two) times daily.    Marland Kitchen lisinopril (PRINIVIL,ZESTRIL) 20 MG tablet Take 1 tablet (20 mg total) by mouth daily. 30 tablet 2  . LORazepam (ATIVAN) 0.5 MG tablet Take 1 tablet (0.5 mg total) by mouth every 8 (eight) hours. 30 tablet 5  . metoCLOPramide (REGLAN) 10 MG tablet Take 1 tablet (10 mg total) by mouth every 8 (eight) hours as needed for nausea. 90 tablet 1  . PROAIR HFA 108 (90 BASE) MCG/ACT inhaler Inhale 1 puff into the lungs every 4 (four) hours as needed for wheezing or shortness of breath.     Marland Kitchen NITROSTAT 0.4 MG SL tablet Place 0.4 mg under the tongue every 5 (five) minutes as needed for chest pain.      No current facility-administered medications on file prior to visit.    ALLERGIES: No Known Allergies  FAMILY HISTORY: Family History  Problem Relation Age of Onset  . Hyperlipidemia Maternal Grandmother   . Asthma Father   . Heart disease Father   . CAD      Multiple siblings    SOCIAL HISTORY: History   Social History  .  Marital Status: Married    Spouse Name: N/A    Number of Children: N/A  . Years of Education: N/A   Occupational History  . Limo driver    Social History Main Topics  . Smoking status: Never Smoker   . Smokeless tobacco: Never Used  . Alcohol Use: No  . Drug Use: No  . Sexual Activity: Not on file   Other Topics Concern  . Not on file   Social History Narrative   Lives with wife.     REVIEW OF SYSTEMS: Constitutional: No fevers, chills, or sweats, no generalized fatigue, change in appetite Eyes: No visual changes, double vision, eye pain Ear, nose and throat: No hearing loss, ear pain, nasal congestion, sore throat Cardiovascular: No chest pain, palpitations Respiratory:  No shortness of breath at rest or with exertion, wheezes GastrointestinaI: No nausea, vomiting, diarrhea, abdominal pain, fecal incontinence Genitourinary:  No dysuria, urinary retention or  frequency Musculoskeletal:  No neck pain, back pain Integumentary: No rash, pruritus, skin lesions Neurological: as above Psychiatric: No depression, insomnia, anxiety Endocrine: No palpitations, fatigue, diaphoresis, mood swings, change in appetite, change in weight, increased thirst Hematologic/Lymphatic:  No anemia, purpura, petechiae. Allergic/Immunologic: no itchy/runny eyes, nasal congestion, recent allergic reactions, rashes  PHYSICAL EXAM: Filed Vitals:   09/01/14 1014  BP: 104/66  Pulse: 88  Resp: 16   General: No acute distress Head:  Normocephalic/atraumatic Neck: supple, no paraspinal tenderness, full range of motion Heart:  irregular rhythm Lungs:  Clear to auscultation bilaterally Back: No paraspinal tenderness Skin/Extremities: No rash, no edema Neurological Exam: alert and oriented to person, place, and time. No aphasia or dysarthria. Fund of knowledge is appropriate.  Recent and remote memory are intact.  Attention and concentration are normal.    Able to name objects and repeat phrases. Cranial nerves: Pupils equal, round, reactive to light.  Fundoscopic exam unremarkable, no papilledema. Extraocular movements intact with no nystagmus. Visual fields full. Facial sensation intact. No facial asymmetry. Tongue, uvula, palate midline.  Motor: Bulk and tone normal, muscle strength 5/5 throughout with no pronator drift.  Sensation to light touch intact.  No extinction to double simultaneous stimulation.  Deep tendon reflexes 2+ throughout, toes downgoing.  Finger to nose testing intact.  Gait narrow-based and steady, able to tandem walk adequately.  Romberg negative.  IMPRESSION: This is a pleasant 73 yo RH man with a vascular risk factors including hypertension, hyperlipidemia, diabetes, CAD s/p CABG and PTCA, peripheral vascular disease s/p fempop bypass, myelofibrosis/myeloproliferative disorder with polycythemia, with new onset seizure secondary to acute left occipital  infarct last 07/09/14. Stroke workup unremarkable, he currently has a loop recorder and has not seen Cardiology yet. He was discharged home on continued aspirin and Plavix, with follow-up with Cardiology. He may benefit from anticoagulation for stroke prevention if there is evidence of Afib on loop recorder, irregular rhythm noted on visit today. Continue control of other vascular risk factors. Continue Keppra 590m BID for seizure prophylaxis. We again discussed that if visual symptoms recur, we will plan to increase dose to 7526mBID. He is aware of [place] driving laws to stop driving after a seizure, until 6 months seizure-free. He knows to go to the ER immediately for any change in symptoms. He will follow-up in 4-5 months and knows to call our office for any problems in the interim.  Thank you for allowing me to participate in his care.  Please do not hesitate to call for any questions or concerns.  The  duration of this appointment visit was 15 minutes of face-to-face time with the patient.  Greater than 50% of this time was spent in counseling, explanation of diagnosis, planning of further management, and coordination of care.   Tyler Whitehead, M.D.   CC: Dr. Aura Dials

## 2014-09-07 NOTE — Progress Notes (Signed)
Done

## 2014-09-08 ENCOUNTER — Other Ambulatory Visit (HOSPITAL_BASED_OUTPATIENT_CLINIC_OR_DEPARTMENT_OTHER): Payer: Medicare Other

## 2014-09-08 ENCOUNTER — Encounter: Payer: Self-pay | Admitting: Internal Medicine

## 2014-09-08 DIAGNOSIS — D751 Secondary polycythemia: Secondary | ICD-10-CM

## 2014-09-08 DIAGNOSIS — D7581 Myelofibrosis: Secondary | ICD-10-CM

## 2014-09-08 LAB — CBC WITH DIFFERENTIAL/PLATELET
BASO%: 0.2 % (ref 0.0–2.0)
Basophils Absolute: 0 10*3/uL (ref 0.0–0.1)
EOS%: 2.7 % (ref 0.0–7.0)
Eosinophils Absolute: 0.6 10*3/uL — ABNORMAL HIGH (ref 0.0–0.5)
HCT: 46.9 % (ref 38.4–49.9)
HGB: 14 g/dL (ref 13.0–17.1)
LYMPH%: 7.1 % — ABNORMAL LOW (ref 14.0–49.0)
MCH: 25.1 pg — ABNORMAL LOW (ref 27.2–33.4)
MCHC: 29.9 g/dL — ABNORMAL LOW (ref 32.0–36.0)
MCV: 83.7 fL (ref 79.3–98.0)
MONO#: 1 10*3/uL — ABNORMAL HIGH (ref 0.1–0.9)
MONO%: 4.6 % (ref 0.0–14.0)
NEUT#: 18.3 10*3/uL — ABNORMAL HIGH (ref 1.5–6.5)
NEUT%: 85.4 % — ABNORMAL HIGH (ref 39.0–75.0)
Platelets: 409 10*3/uL — ABNORMAL HIGH (ref 140–400)
RBC: 5.6 10*6/uL (ref 4.20–5.82)
RDW: 20.5 % — ABNORMAL HIGH (ref 11.0–14.6)
WBC: 21.4 10*3/uL — ABNORMAL HIGH (ref 4.0–10.3)
lymph#: 1.5 10*3/uL (ref 0.9–3.3)

## 2014-09-09 ENCOUNTER — Encounter: Payer: Self-pay | Admitting: Internal Medicine

## 2014-09-10 ENCOUNTER — Ambulatory Visit (INDEPENDENT_AMBULATORY_CARE_PROVIDER_SITE_OTHER): Payer: Medicare Other | Admitting: *Deleted

## 2014-09-10 DIAGNOSIS — I6330 Cerebral infarction due to thrombosis of unspecified cerebral artery: Secondary | ICD-10-CM

## 2014-09-11 LAB — MDC_IDC_ENUM_SESS_TYPE_REMOTE
Date Time Interrogation Session: 20151206041619
Zone Setting Detection Interval: 2000 ms
Zone Setting Detection Interval: 3000 ms
Zone Setting Detection Interval: 380 ms

## 2014-09-13 ENCOUNTER — Telehealth: Payer: Self-pay | Admitting: Hematology

## 2014-09-13 ENCOUNTER — Other Ambulatory Visit: Payer: Self-pay | Admitting: *Deleted

## 2014-09-13 DIAGNOSIS — I739 Peripheral vascular disease, unspecified: Secondary | ICD-10-CM

## 2014-09-13 NOTE — Telephone Encounter (Signed)
moved from [place] 12/16 to [place] 12/29. s/w pt he is aware.

## 2014-09-14 ENCOUNTER — Encounter: Payer: Self-pay | Admitting: Surgery

## 2014-09-15 ENCOUNTER — Ambulatory Visit (HOSPITAL_COMMUNITY)
Admission: RE | Admit: 2014-09-15 | Discharge: 2014-09-15 | Disposition: A | Discharge status: Home / Self Care | Payer: Medicare Other | Source: Ambulatory Visit | Attending: Surgery | Admitting: Surgery

## 2014-09-15 ENCOUNTER — Encounter: Payer: Self-pay | Admitting: Surgery

## 2014-09-15 ENCOUNTER — Other Ambulatory Visit: Payer: Self-pay

## 2014-09-15 ENCOUNTER — Ambulatory Visit (INDEPENDENT_AMBULATORY_CARE_PROVIDER_SITE_OTHER): Payer: Medicare Other | Admitting: Surgery

## 2014-09-15 ENCOUNTER — Other Ambulatory Visit: Payer: Medicare Other

## 2014-09-15 VITALS — BP 159/85 | HR 78 | Temp 98.5°F | Resp 16 | Ht 67.0 in | Wt 188.0 lb

## 2014-09-15 DIAGNOSIS — I739 Peripheral vascular disease, unspecified: Secondary | ICD-10-CM | POA: Diagnosis not present

## 2014-09-15 DIAGNOSIS — I70219 Atherosclerosis of native arteries of extremities with intermittent claudication, unspecified extremity: Secondary | ICD-10-CM

## 2014-09-15 NOTE — Progress Notes (Signed)
Patient name: Tyler Whitehead MRN: [id number] DOB: 06/26/1941 Sex: male   Referred by: Dr. Owens Shark  Reason for referral:  Chief Complaint  Patient presents with  . PAD    Pain in both legs all the time, Right leg is worse. Had BPG in 2000 in [place].   Left leg hurts at night and resting.     HISTORY OF PRESENT ILLNESS: This is a 73 year old gentleman who is referred for evaluation of right leg claudication.  The patient states that he had bypass surgery in the right leg using prosthetic vein and [place] and 2000.  This lasted approximately 1 year.  He has claudication symptoms mostly in his buttocks and thigh with minimal activity.  He occasionally will have pain at night.  He denies nonhealing ulcers.  He has pain in his left leg, but this is primarily in the knee.  The patient is a diabetic.  His most recent hemoglobin A1c was 12.9.  He has significant cardiac disease.  He is status post CABG in 2000 and subsequent stenting.  He has seen Dr. [name] in the past for his claudication.  Angiography was scheduled but not performed secondary to financial constraints.  He does have ongoing chest pain.  The patient's hypercholesterolemia is managed with a statin.  He is on ACE inhibitor for hypertension.  He is a nonsmoker.  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  His stroke workup was unremarkable.    Past Medical History  Diagnosis Date  . Primary myelofibrosis   . Diverticular disease   . Hemorrhoid   . CAD (coronary artery disease)     a. S/p 3V CABG 2000 with LIMA-LAD, left radial-PDA, SVG-OM1. b. 2014: stent to SVG-OM1; then found to have obstruction distal to LAD anastamosis of LIMA s/p DES.   Marland Kitchen Peripheral vascular disease     a. R femoropopliteal bypass ~2012 in [place], reportedly shown to be occluded. Now followed by Dr. [name].  . DM (diabetes mellitus)   . Hyperlipidemia   . HTN (hypertension)   . RBBB     Past  Surgical History  Procedure Laterality Date  . Coronary artery bypass graft  2000    LM occluded, LIMA to LAD patent but distal native LAD disease, saphenous vein bypass graft to OM with 95% stenosis in the midportion, right coronary artery occluded, radial artery graft to right coronary artery patent but was diffusely small. Marginal graft stented. 7 2014. Of note he had multiple previous stents in the right coronary artery. The distal LAD was treated with angioplasty. I  . Cardiac catheterization  May 2014  . Percutaneous coronary stent intervention (pci-s)  April 27 2013  . Vein surgery    . Tee without cardioversion N/A 07/12/2014    Procedure: TRANSESOPHAGEAL ECHOCARDIOGRAM (TEE);  Surgeon: Fay Records, [place];  Location: [facility] ENDOSCOPY;  Service: Cardiovascular;  Laterality: N/A;    History   Social History  . Marital Status: Married    Spouse Name: N/A    Number of Children: N/A  . Years of Education: N/A   Occupational History  . Limo driver    Social History Main Topics  . Smoking status: Never Smoker   . Smokeless tobacco: Never Used  . Alcohol Use: No  . Drug Use: No  . Sexual Activity: Not on file   Other Topics Concern  . Not on file   Social History Narrative  Lives with wife.     Family History  Problem Relation Age of Onset  . Hyperlipidemia Maternal Grandmother   . Asthma Father   . Heart disease Father   . CAD      Multiple siblings    Allergies as of 09/15/2014  . (No Known Allergies)    Current Outpatient Prescriptions on File Prior to Visit  Medication Sig Dispense Refill  . aspirin 81 MG tablet Take 81 mg by mouth daily.    Marland Kitchen atorvastatin (LIPITOR) 40 MG tablet Take 1.5 tablets (60 mg total) by mouth daily. 135 tablet 3  . carvedilol (COREG) 6.25 MG tablet Take 1 tablet (6.25 mg total) by mouth 2 (two) times daily with a meal. 60 tablet 5  . cholecalciferol (VITAMIN D) 1000 UNITS tablet Take 1,000 Units by mouth daily.    . clopidogrel (PLAVIX)  75 MG tablet Take 1 tablet (75 mg total) by mouth daily with breakfast. 30 tablet 6  . diphenhydramine-acetaminophen (TYLENOL PM) 25-500 MG TABS Take 1 tablet by mouth at bedtime as needed (sleep/pain).    . fluticasone (FLONASE) 50 MCG/ACT nasal spray Place 1 spray into both nostrils daily. 16 g 6  . hydroxyurea (HYDREA) 500 MG capsule Take 1 capsule (500 mg total) by mouth 2 (two) times daily. 60 capsule 3  . Insulin Glargine (LANTUS SOLOSTAR) 100 UNIT/ML Solostar Pen Inject 10 Units into the skin 2 (two) times daily. 15 mL 12  . insulin lispro (HUMALOG) 100 UNIT/ML injection Inject 4 Units into the skin. 4 units before each meal    . isosorbide mononitrate (IMDUR) 30 MG 24 hr tablet Take 30 mg by mouth daily.    Marland Kitchen levETIRAcetam (KEPPRA) 500 MG tablet Take 1 tablet (500 mg total) by mouth 2 (two) times daily. 60 tablet 6  . Linagliptin-Metformin HCl 2.5-500 MG TABS Take 1 tablet by mouth 2 (two) times daily.    Marland Kitchen lisinopril (PRINIVIL,ZESTRIL) 20 MG tablet Take 1 tablet (20 mg total) by mouth daily. 30 tablet 2  . LORazepam (ATIVAN) 0.5 MG tablet Take 1 tablet (0.5 mg total) by mouth every 8 (eight) hours. 30 tablet 5  . metoCLOPramide (REGLAN) 10 MG tablet Take 1 tablet (10 mg total) by mouth every 8 (eight) hours as needed for nausea. 90 tablet 1  . NITROSTAT 0.4 MG SL tablet Place 0.4 mg under the tongue every 5 (five) minutes as needed for chest pain.     Marland Kitchen PROAIR HFA 108 (90 BASE) MCG/ACT inhaler Inhale 1 puff into the lungs every 4 (four) hours as needed for wheezing or shortness of breath.     . Vitamin D, Ergocalciferol, (DRISDOL) 50000 UNITS CAPS capsule 50,000 Units. Takes 1 weekly  0   No current facility-administered medications on file prior to visit.     REVIEW OF SYSTEMS: Cardiovascular: Positive for chest pain, chest pressure, shortness of breath with exertion, pain in legs with walking and lying flat, leg swelling and varicose veins  Pulmonary:Positive for  productive cough,   no asthma or wheezing. Neurologic: Positive for weakness, difficulty speaking and temporary loss of vision  Hematologic: No bleeding problems or clotting disorders. Musculoskeletal: No joint pain or joint swelling. Gastrointestinal: No blood in stool or hematemesis Genitourinary: No dysuria or hematuria. Psychiatric:: No history of major depression. Integumentary: No rashes or ulcers. Constitutional: No fever or chills.  PHYSICAL EXAMINATION: General: The patient appears their stated age.  Vital signs are BP 159/85 mmHg  Pulse 78  Temp(Src) 98.5 F (  36.9 C) (Oral)  Resp 16  Ht 5\' 7"  (1.702 m)  Wt 188 lb (85.276 kg)  BMI 29.44 kg/m2  SpO2 99% HEENT:  No gross abnormalities Pulmonary: Respirations are non-labored Abdomen: Soft and non-tender  Musculoskeletal: There are no major deformities.   Neurologic: No focal weakness or paresthesias are detected, Skin: There are no ulcer or rashes noted. Psychiatric: The patient has normal affect. Cardiovascular: There is a regular rate and rhythm without significant murmur appreciated.No carotid bruits.  Palpable femoral pulses.  Diagnostic Studies: ABIs ordered and reviewed.  The right is 0.46.  The left is 0.67    Assessment:  Atherosclerosis with claudication, right leg  Plan: The patient states that his symptoms are not tolerable.  There are significantly affecting his quality of life.  In order to better define his anatomy, he needs angiography.  He has previously seen Dr. [name].  I offered him to do the procedure but the patient would like for me to do this.  It has been scheduled for Tuesday, December 15.  I discussed that I would cannulate the left common femoral artery and image both lower extremities as well as the aorta.  I wouldn't intervene on the right leg if percutaneous intervention as possible.  If not, the patient will be evaluated for bypass surgery.  He would like to get this done before the end of the year if  possible  The patient's diabetes needs to be better controlled given his most recent hemoglobin A1c.  His cholesterol profile looks good and he is on a statin.  He is a nonsmoker.   Eldridge Abrahams, M.D. [facility] Office: [phone] Pager:  [phone]

## 2014-09-15 NOTE — Progress Notes (Signed)
Loop recorder 

## 2014-09-16 ENCOUNTER — Encounter (HOSPITAL_COMMUNITY): Payer: Self-pay | Admitting: Internal Medicine

## 2014-09-20 MED ORDER — SODIUM CHLORIDE 0.9 % IV SOLN: INTRAVENOUS | Status: DC

## 2014-09-21 ENCOUNTER — Encounter (HOSPITAL_COMMUNITY)
Admission: RE | Disposition: A | Discharge status: Home / Self Care | Payer: Self-pay | Source: Ambulatory Visit | Attending: Surgery

## 2014-09-21 ENCOUNTER — Ambulatory Visit (HOSPITAL_COMMUNITY)
Admission: RE | Admit: 2014-09-21 | Discharge: 2014-09-21 | Disposition: A | Discharge status: Home / Self Care | Payer: Medicare Other | Source: Ambulatory Visit | Attending: Surgery | Admitting: Surgery

## 2014-09-21 ENCOUNTER — Other Ambulatory Visit: Payer: Self-pay | Admitting: *Deleted

## 2014-09-21 ENCOUNTER — Encounter (HOSPITAL_COMMUNITY): Payer: Self-pay | Admitting: Vascular Surgery

## 2014-09-21 DIAGNOSIS — I2510 Atherosclerotic heart disease of native coronary artery without angina pectoris: Secondary | ICD-10-CM | POA: Insufficient documentation

## 2014-09-21 DIAGNOSIS — I739 Peripheral vascular disease, unspecified: Secondary | ICD-10-CM | POA: Insufficient documentation

## 2014-09-21 DIAGNOSIS — E780 Pure hypercholesterolemia: Secondary | ICD-10-CM | POA: Diagnosis not present

## 2014-09-21 DIAGNOSIS — I10 Essential (primary) hypertension: Secondary | ICD-10-CM | POA: Diagnosis not present

## 2014-09-21 DIAGNOSIS — Z951 Presence of aortocoronary bypass graft: Secondary | ICD-10-CM | POA: Diagnosis not present

## 2014-09-21 DIAGNOSIS — I70221 Atherosclerosis of native arteries of extremities with rest pain, right leg: Secondary | ICD-10-CM

## 2014-09-21 DIAGNOSIS — Z01818 Encounter for other preprocedural examination: Secondary | ICD-10-CM

## 2014-09-21 HISTORY — PX: LOWER EXTREMITY ANGIOGRAM: SHX5508

## 2014-09-21 HISTORY — PX: ABDOMINAL AORTAGRAM: SHX5454

## 2014-09-21 LAB — POCT I-STAT, CHEM 8
BUN: 15 mg/dL (ref 6–23)
Calcium, Ion: 1.21 mmol/L (ref 1.13–1.30)
Chloride: 102 mEq/L (ref 96–112)
Creatinine, Ser: 1.3 mg/dL (ref 0.50–1.35)
Glucose, Bld: 144 mg/dL — ABNORMAL HIGH (ref 70–99)
HCT: 48 % (ref 39.0–52.0)
Hemoglobin: 16.3 g/dL (ref 13.0–17.0)
Potassium: 4.3 mEq/L (ref 3.7–5.3)
Sodium: 139 mEq/L (ref 137–147)
TCO2: 25 mmol/L (ref 0–100)

## 2014-09-21 LAB — GLUCOSE, CAPILLARY
Glucose-Capillary: 133 mg/dL — ABNORMAL HIGH (ref 70–99)
Glucose-Capillary: 90 mg/dL (ref 70–99)

## 2014-09-21 SURGERY — ABDOMINAL AORTAGRAM
Anesthesia: LOCAL

## 2014-09-21 MED ORDER — LABETALOL HCL 5 MG/ML IV SOLN: 10.0000 mg | INTRAVENOUS | Status: DC | PRN

## 2014-09-21 MED ORDER — HYDRALAZINE HCL 20 MG/ML IJ SOLN: 5.0000 mg | INTRAMUSCULAR | Status: DC | PRN

## 2014-09-21 MED ORDER — MORPHINE SULFATE 10 MG/ML IJ SOLN: 2.0000 mg | INTRAMUSCULAR | Status: DC | PRN

## 2014-09-21 MED ORDER — METOPROLOL TARTRATE 1 MG/ML IV SOLN: 2.0000 mg | INTRAVENOUS | Status: DC | PRN

## 2014-09-21 MED ORDER — ACETAMINOPHEN 325 MG PO TABS: 325.0000 mg | ORAL_TABLET | ORAL | Status: DC | PRN

## 2014-09-21 MED ORDER — ACETAMINOPHEN 325 MG RE SUPP: 325.0000 mg | RECTAL | Status: DC | PRN

## 2014-09-21 MED ORDER — SODIUM CHLORIDE 0.45 % IV SOLN
INTRAVENOUS | Status: DC
  Administered 2014-09-21: 16:00:00 via INTRAVENOUS

## 2014-09-21 MED ORDER — ALUM & MAG HYDROXIDE-SIMETH 200-200-20 MG/5ML PO SUSP: 15.0000 mL | ORAL | Status: DC | PRN

## 2014-09-21 MED ORDER — ONDANSETRON HCL 4 MG/2ML IJ SOLN: 4.0000 mg | Freq: Four times a day (QID) | INTRAMUSCULAR | Status: DC | PRN

## 2014-09-21 MED ORDER — DOCUSATE SODIUM 100 MG PO CAPS: 100.0000 mg | ORAL_CAPSULE | Freq: Every day | ORAL | Status: DC

## 2014-09-21 MED ORDER — PANTOPRAZOLE SODIUM 40 MG PO TBEC: 40.0000 mg | DELAYED_RELEASE_TABLET | Freq: Every day | ORAL | Status: DC

## 2014-09-21 SURGICAL SUPPLY — 53 items
BANDAGE ELASTIC 4 VELCRO ST LF (GAUZE/BANDAGES/DRESSINGS) IMPLANT
BANDAGE ESMARK 6X9 LF (GAUZE/BANDAGES/DRESSINGS) IMPLANT
BNDG ESMARK 6X9 LF (GAUZE/BANDAGES/DRESSINGS)
CANISTER SUCTION 2500CC (MISCELLANEOUS) ×3 IMPLANT
CLIP TI MEDIUM 24 (CLIP) ×3 IMPLANT
CLIP TI WIDE RED SMALL 24 (CLIP) ×3 IMPLANT
COVER SURGICAL LIGHT HANDLE (MISCELLANEOUS) ×3 IMPLANT
CUFF TOURNIQUET SINGLE 24IN (TOURNIQUET CUFF) IMPLANT
CUFF TOURNIQUET SINGLE 34IN LL (TOURNIQUET CUFF) IMPLANT
CUFF TOURNIQUET SINGLE 44IN (TOURNIQUET CUFF) IMPLANT
DERMABOND ADVANCED (GAUZE/BANDAGES/DRESSINGS) ×1
DERMABOND ADVANCED .7 DNX12 (GAUZE/BANDAGES/DRESSINGS) ×2 IMPLANT
DRAIN CHANNEL 15F RND FF W/TCR (WOUND CARE) IMPLANT
DRAPE WARM FLUID 44X44 (DRAPE) ×3 IMPLANT
DRAPE X-RAY CASS 24X20 (DRAPES) IMPLANT
DRSG COVADERM 4X10 (GAUZE/BANDAGES/DRESSINGS) IMPLANT
DRSG COVADERM 4X8 (GAUZE/BANDAGES/DRESSINGS) IMPLANT
ELECT REM PT RETURN 9FT ADLT (ELECTROSURGICAL) ×3
ELECTRODE REM PT RTRN 9FT ADLT (ELECTROSURGICAL) ×2 IMPLANT
EVACUATOR SILICONE 100CC (DRAIN) IMPLANT
GLOVE BIOGEL PI IND STRL 7.5 (GLOVE) ×2 IMPLANT
GLOVE BIOGEL PI INDICATOR 7.5 (GLOVE) ×1
GLOVE SURG SS PI 7.5 STRL IVOR (GLOVE) ×3 IMPLANT
GOWN PREVENTION PLUS XXLARGE (GOWN DISPOSABLE) ×3 IMPLANT
GOWN STRL NON-REIN LRG LVL3 (GOWN DISPOSABLE) ×9 IMPLANT
HEMOSTAT SNOW SURGICEL 2X4 (HEMOSTASIS) IMPLANT
KIT BASIN OR (CUSTOM PROCEDURE TRAY) ×3 IMPLANT
KIT ROOM TURNOVER OR (KITS) ×3 IMPLANT
MARKER GRAFT CORONARY BYPASS (MISCELLANEOUS) IMPLANT
NS IRRIG 1000ML POUR BTL (IV SOLUTION) ×6 IMPLANT
PACK PERIPHERAL VASCULAR (CUSTOM PROCEDURE TRAY) ×3 IMPLANT
PAD ARMBOARD 7.5X6 YLW CONV (MISCELLANEOUS) ×6 IMPLANT
PADDING CAST COTTON 6X4 STRL (CAST SUPPLIES) IMPLANT
SET COLLECT BLD 21X3/4 12 (NEEDLE) IMPLANT
STOPCOCK 4 WAY LG BORE MALE ST (IV SETS) IMPLANT
SUT ETHILON 3 0 PS 1 (SUTURE) IMPLANT
SUT PROLENE 5 0 C 1 24 (SUTURE) ×3 IMPLANT
SUT PROLENE 6 0 BV (SUTURE) ×3 IMPLANT
SUT PROLENE 7 0 BV 1 (SUTURE) IMPLANT
SUT SILK 2 0 SH (SUTURE) ×3 IMPLANT
SUT SILK 3 0 (SUTURE)
SUT SILK 3-0 18XBRD TIE 12 (SUTURE) IMPLANT
SUT VIC AB 2-0 CT1 27 (SUTURE) ×2
SUT VIC AB 2-0 CT1 TAPERPNT 27 (SUTURE) ×4 IMPLANT
SUT VIC AB 3-0 SH 27 (SUTURE) ×2
SUT VIC AB 3-0 SH 27X BRD (SUTURE) ×4 IMPLANT
SUT VICRYL 4-0 PS2 18IN ABS (SUTURE) ×6 IMPLANT
TOWEL OR 17X24 6PK STRL BLUE (TOWEL DISPOSABLE) ×6 IMPLANT
TOWEL OR 17X26 10 PK STRL BLUE (TOWEL DISPOSABLE) ×6 IMPLANT
TRAY FOLEY CATH 16FRSI W/METER (SET/KITS/TRAYS/PACK) ×3 IMPLANT
TUBING EXTENTION W/L.L. (IV SETS) IMPLANT
UNDERPAD 30X30 INCONTINENT (UNDERPADS AND DIAPERS) ×3 IMPLANT
WATER STERILE IRR 1000ML POUR (IV SOLUTION) ×3 IMPLANT

## 2014-09-21 NOTE — Interval H&P Note (Signed)
History and Physical Interval Note:  09/21/2014 1:30 PM  Tyler Whitehead  has presented today for surgery, with the diagnosis of pad  The various methods of treatment have been discussed with the patient and family. After consideration of risks, benefits and other options for treatment, the patient has consented to  Procedure(s): ABDOMINAL AORTAGRAM (N/A) as a surgical intervention .  The patient's history has been reviewed, patient examined, no change in status, stable for surgery.  I have reviewed the patient's chart and labs.  Questions were answered to the patient's satisfaction.     FIELDS,CHARLES E

## 2014-09-21 NOTE — Op Note (Signed)
Procedure: Aortogram with bilateral lower extremity runoff  Preoperative diagnosis:  Bilateral lower extremity claudication with right foot rest pain Postoperative diagnosis: Same  Anesthesia: Local  Operative findings: #1 right leg superficial femoral artery occlusion occlusion above-knee popliteal artery patent below-knee popliteal artery with one-vessel runoff posterior tibial artery #2 left leg superficial femoral artery occlusion occlusion of below-knee popliteal artery one-vessel runoff posterior tibial artery  Operative details: After obtaining informed consent, the patient was brought to the [facility] lab. The patient was placed in supine position on the Angio table. Both groins were prepped and draped in usual sterile fashion. Local anesthesia was infiltrated over the left common femoral artery. Ultrasound was used to identify the left common femoral artery and an introducer needle was used to cannulate the left common femoral artery using ultrasound guidance. An 035 versacore wire was then threaded up the abdominal aorta under fluoroscopic guidance. A 5 French sheath was placed over the guidewire in the left common femoral artery and this was thoroughly flushed with heparinized saline. Intermittent flushes of the sheath were performed throughout the procedure to maintain patency. Next a 5 [place] pig tail catheter was placed over the guidewire into the abdominal aorta and abdominal aortogram was obtained in an AP projection. The Infrarenal abdominal aorta is patent. The left and right renal arteries are patent. The left and right common external and internal iliac arteries are widely patent.  Next the pigtail catheter was pulled down just above the aortic bifurcation and a pelvic angiogram was performed to confirm the above findings. Bilateral lower extremity runoff views were then obtained through the pigtail catheter.  In the Right lower extremity, the right common femoral artery is patent. The right  profunda femoris artery is patent but diseased distally. The right superficial femoral artery occludes just after it's origin. The above-knee popliteal artery is occluded. The below-knee popliteal artery does reconstitute and it appears that there is one-vessel runoff via the posterior tibial artery but this is poorly opacified.  Tibial peroneal trunk on the right side is also diffusely diseased.  In the left lower extremity, the left common femoral artery is patent. The left profunda femoris artery is patent. The left superficial femoral artery is patent proximally but occludes in the mid leg. The above-knee popliteal artery reconstitutes briefly via collaterals. The below-knee popliteal artery is occluded. The posterior tibial artery reconstitutes mid leg via profunda and SFA collaterals.  At this point the pig catheter was removed over a guidewire. A 5 French crossover catheter was then placed over the guidewire and the right common iliac artery selectively catheterize and the guidewire advanced down of the distal right external iliac artery. The crossover catheter was removed and replaced with a 5 French straight catheter. Right lower extremity arteriogram extra views were obtained to further define the tibial anatomy which confirmed the above findings.  In similar fashion extra views of the distal superficial femoral artery popliteal artery and tibial vessels were performed through injection in the left femoral sheath to further clarify the left lower extremity which again confirms the above findings.  The patient tolerated the procedure well and there were no complications. The patient was taken to the holding area in stable condition. The sheath will be removed in the holding area.  Operative management: The patient will be scheduled for an office visit with Dr. Trula Slade in the near future for consideration of redo right femoral to below-knee popliteal or posterior tibial artery bypass.  Ruta Hinds, MD [facility] Office: [phone] Pager: [phone]

## 2014-09-21 NOTE — Progress Notes (Signed)
Site area: Left groin a 5 french arterial sheath was removed  Site Prior to Removal:  Level 0  Pressure Applied For 20 MINUTES    Minutes Beginning at 1530  Manual:   Yes.    Patient Status During Pull:  stable  Post Pull Groin Site:  Level 0  Post Pull Instructions Given:  Yes.    Post Pull Pulses Present:  Yes.    Dressing Applied:  Yes.    Comments:  VS remain stable during sheath pull.  Pt denies  Any discomfort at this time.

## 2014-09-21 NOTE — H&P (View-Only) (Signed)
Patient name: Tyler Whitehead MRN: [id number] DOB: [birth year] Sex: male   Referred by: Dr. Owens Shark  Reason for referral:  Chief Complaint  Patient presents with  . PAD    Pain in both legs all the time, Right leg is worse. Had BPG in 2000 in [place].   Left leg hurts at night and resting.     HISTORY OF PRESENT ILLNESS: This is a 73 year old gentleman who is referred for evaluation of right leg claudication.  The patient states that he had bypass surgery in the right leg using prosthetic vein and [place] and 2000.  This lasted approximately 1 year.  He has claudication symptoms mostly in his buttocks and thigh with minimal activity.  He occasionally will have pain at night.  He denies nonhealing ulcers.  He has pain in his left leg, but this is primarily in the knee.  The patient is a diabetic.  His most recent hemoglobin A1c was 12.9.  He has significant cardiac disease.  He is status post CABG in 2000 and subsequent stenting.  He has seen Dr. [name] in the past for his claudication.  Angiography was scheduled but not performed secondary to financial constraints.  He does have ongoing chest pain.  The patient's hypercholesterolemia is managed with a statin.  He is on ACE inhibitor for hypertension.  He is a nonsmoker.  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  His stroke workup was unremarkable.    Past Medical History  Diagnosis Date  . Primary myelofibrosis   . Diverticular disease   . Hemorrhoid   . CAD (coronary artery disease)     a. S/p 3V CABG 2000 with LIMA-LAD, left radial-PDA, SVG-OM1. b. 2014: stent to SVG-OM1; then found to have obstruction distal to LAD anastamosis of LIMA s/p DES.   Marland Kitchen Peripheral vascular disease     a. R femoropopliteal bypass ~2012 in [place], reportedly shown to be occluded. Now followed by Dr. [name].  . DM (diabetes mellitus)   . Hyperlipidemia   . HTN (hypertension)   . RBBB     Past  Surgical History  Procedure Laterality Date  . Coronary artery bypass graft  2000    LM occluded, LIMA to LAD patent but distal native LAD disease, saphenous vein bypass graft to OM with 95% stenosis in the midportion, right coronary artery occluded, radial artery graft to right coronary artery patent but was diffusely small. Marginal graft stented. 7 2014. Of note he had multiple previous stents in the right coronary artery. The distal LAD was treated with angioplasty. I  . Cardiac catheterization  May 2014  . Percutaneous coronary stent intervention (pci-s)  April 27 2013  . Vein surgery    . Tee without cardioversion N/A 07/12/2014    Procedure: TRANSESOPHAGEAL ECHOCARDIOGRAM (TEE);  Surgeon: Fay Records, MD;  Location: [facility] ENDOSCOPY;  Service: Cardiovascular;  Laterality: N/A;    History   Social History  . Marital Status: Married    Spouse Name: N/A    Number of Children: N/A  . Years of Education: N/A   Occupational History  . Limo driver    Social History Main Topics  . Smoking status: Never Smoker   . Smokeless tobacco: Never Used  . Alcohol Use: No  . Drug Use: No  . Sexual Activity: Not on file   Other Topics Concern  . Not on file   Social History Narrative  Lives with wife.     Family History  Problem Relation Age of Onset  . Hyperlipidemia Maternal Grandmother   . Asthma Father   . Heart disease Father   . CAD      Multiple siblings    Allergies as of 09/15/2014  . (No Known Allergies)    Current Outpatient Prescriptions on File Prior to Visit  Medication Sig Dispense Refill  . aspirin 81 MG tablet Take 81 mg by mouth daily.    Marland Kitchen atorvastatin (LIPITOR) 40 MG tablet Take 1.5 tablets (60 mg total) by mouth daily. 135 tablet 3  . carvedilol (COREG) 6.25 MG tablet Take 1 tablet (6.25 mg total) by mouth 2 (two) times daily with a meal. 60 tablet 5  . cholecalciferol (VITAMIN D) 1000 UNITS tablet Take 1,000 Units by mouth daily.    . clopidogrel (PLAVIX)  75 MG tablet Take 1 tablet (75 mg total) by mouth daily with breakfast. 30 tablet 6  . diphenhydramine-acetaminophen (TYLENOL PM) 25-500 MG TABS Take 1 tablet by mouth at bedtime as needed (sleep/pain).    . fluticasone (FLONASE) 50 MCG/ACT nasal spray Place 1 spray into both nostrils daily. 16 g 6  . hydroxyurea (HYDREA) 500 MG capsule Take 1 capsule (500 mg total) by mouth 2 (two) times daily. 60 capsule 3  . Insulin Glargine (LANTUS SOLOSTAR) 100 UNIT/ML Solostar Pen Inject 10 Units into the skin 2 (two) times daily. 15 mL 12  . insulin lispro (HUMALOG) 100 UNIT/ML injection Inject 4 Units into the skin. 4 units before each meal    . isosorbide mononitrate (IMDUR) 30 MG 24 hr tablet Take 30 mg by mouth daily.    Marland Kitchen levETIRAcetam (KEPPRA) 500 MG tablet Take 1 tablet (500 mg total) by mouth 2 (two) times daily. 60 tablet 6  . Linagliptin-Metformin HCl 2.5-500 MG TABS Take 1 tablet by mouth 2 (two) times daily.    Marland Kitchen lisinopril (PRINIVIL,ZESTRIL) 20 MG tablet Take 1 tablet (20 mg total) by mouth daily. 30 tablet 2  . LORazepam (ATIVAN) 0.5 MG tablet Take 1 tablet (0.5 mg total) by mouth every 8 (eight) hours. 30 tablet 5  . metoCLOPramide (REGLAN) 10 MG tablet Take 1 tablet (10 mg total) by mouth every 8 (eight) hours as needed for nausea. 90 tablet 1  . NITROSTAT 0.4 MG SL tablet Place 0.4 mg under the tongue every 5 (five) minutes as needed for chest pain.     Marland Kitchen PROAIR HFA 108 (90 BASE) MCG/ACT inhaler Inhale 1 puff into the lungs every 4 (four) hours as needed for wheezing or shortness of breath.     . Vitamin D, Ergocalciferol, (DRISDOL) 50000 UNITS CAPS capsule 50,000 Units. Takes 1 weekly  0   No current facility-administered medications on file prior to visit.     REVIEW OF SYSTEMS: Cardiovascular: Positive for chest pain, chest pressure, shortness of breath with exertion, pain in legs with walking and lying flat, leg swelling and varicose veins  Pulmonary:Positive for  productive cough,   no asthma or wheezing. Neurologic: Positive for weakness, difficulty speaking and temporary loss of vision  Hematologic: No bleeding problems or clotting disorders. Musculoskeletal: No joint pain or joint swelling. Gastrointestinal: No blood in stool or hematemesis Genitourinary: No dysuria or hematuria. Psychiatric:: No history of major depression. Integumentary: No rashes or ulcers. Constitutional: No fever or chills.  PHYSICAL EXAMINATION: General: The patient appears their stated age.  Vital signs are BP 159/85 mmHg  Pulse 78  Temp(Src) 98.5 F (  36.9 C) (Oral)  Resp 16  Ht 5\' 7"  (1.702 m)  Wt 188 lb (85.276 kg)  BMI 29.44 kg/m2  SpO2 99% HEENT:  No gross abnormalities Pulmonary: Respirations are non-labored Abdomen: Soft and non-tender  Musculoskeletal: There are no major deformities.   Neurologic: No focal weakness or paresthesias are detected, Skin: There are no ulcer or rashes noted. Psychiatric: The patient has normal affect. Cardiovascular: There is a regular rate and rhythm without significant murmur appreciated.No carotid bruits.  Palpable femoral pulses.  Diagnostic Studies: ABIs ordered and reviewed.  The right is 0.46.  The left is 0.67    Assessment:  Atherosclerosis with claudication, right leg  Plan: The patient states that his symptoms are not tolerable.  There are significantly affecting his quality of life.  In order to better define his anatomy, he needs angiography.  He has previously seen Dr. [name].  I offered him to do the procedure but the patient would like for me to do this.  It has been scheduled for Tuesday, December 15.  I discussed that I would cannulate the left common femoral artery and image both lower extremities as well as the aorta.  I wouldn't intervene on the right leg if percutaneous intervention as possible.  If not, the patient will be evaluated for bypass surgery.  He would like to get this done before the end of the year if  possible  The patient's diabetes needs to be better controlled given his most recent hemoglobin A1c.  His cholesterol profile looks good and he is on a statin.  He is a nonsmoker.   Eldridge Abrahams, M.D. [facility] Office: [phone] Pager:  [phone]

## 2014-09-21 NOTE — Discharge Instructions (Signed)

## 2014-09-22 ENCOUNTER — Ambulatory Visit: Payer: Medicare Other

## 2014-09-22 ENCOUNTER — Other Ambulatory Visit: Payer: Medicare Other

## 2014-09-24 ENCOUNTER — Encounter: Payer: Self-pay | Admitting: Surgery

## 2014-09-27 ENCOUNTER — Ambulatory Visit (INDEPENDENT_AMBULATORY_CARE_PROVIDER_SITE_OTHER): Payer: Medicare Other | Admitting: Surgery

## 2014-09-27 ENCOUNTER — Encounter: Payer: Self-pay | Admitting: Internal Medicine

## 2014-09-27 ENCOUNTER — Ambulatory Visit (HOSPITAL_COMMUNITY)
Admission: RE | Admit: 2014-09-27 | Discharge: 2014-09-27 | Disposition: A | Discharge status: Home / Self Care | Payer: Medicare Other | Source: Ambulatory Visit | Attending: Surgery | Admitting: Surgery

## 2014-09-27 ENCOUNTER — Encounter: Payer: Self-pay | Admitting: Surgery

## 2014-09-27 VITALS — BP 124/66 | HR 69 | Temp 97.5°F | Resp 24 | Ht 67.0 in | Wt 190.8 lb

## 2014-09-27 DIAGNOSIS — I739 Peripheral vascular disease, unspecified: Secondary | ICD-10-CM | POA: Insufficient documentation

## 2014-09-27 DIAGNOSIS — Z01818 Encounter for other preprocedural examination: Secondary | ICD-10-CM | POA: Diagnosis present

## 2014-09-27 DIAGNOSIS — I70219 Atherosclerosis of native arteries of extremities with intermittent claudication, unspecified extremity: Secondary | ICD-10-CM | POA: Insufficient documentation

## 2014-09-27 NOTE — Progress Notes (Signed)
Patient name: Tyler Whitehead MRN: [id number] DOB: Mar 08, 1941 Sex: male     Chief Complaint  Patient presents with  . PVD    recent angiogram; to discuss surgery for leg bypass    HISTORY OF PRESENT ILLNESS: The patient is back today for further discussions regarding his right leg claudication.  He had an above-knee femoral-popliteal bypass graft and [place] which lasted 2-3 years.  He states that he has pain in his hip and right thigh with activity as well as cramping in his right calf.  This is severely debilitating for him.  He recently underwent angiography  Past Medical History  Diagnosis Date  . Primary myelofibrosis   . Diverticular disease   . Hemorrhoid   . CAD (coronary artery disease)     a. S/p 3V CABG 2000 with LIMA-LAD, left radial-PDA, SVG-OM1. b. 2014: stent to SVG-OM1; then found to have obstruction distal to LAD anastamosis of LIMA s/p DES.   Marland Kitchen Peripheral vascular disease     a. R femoropopliteal bypass ~2012 in [place], reportedly shown to be occluded. Now followed by Dr. [name].  . DM (diabetes mellitus)   . Hyperlipidemia   . HTN (hypertension)   . RBBB     Past Surgical History  Procedure Laterality Date  . Coronary artery bypass graft  2000    LM occluded, LIMA to LAD patent but distal native LAD disease, saphenous vein bypass graft to OM with 95% stenosis in the midportion, right coronary artery occluded, radial artery graft to right coronary artery patent but was diffusely small. Marginal graft stented. 7 2014. Of note he had multiple previous stents in the right coronary artery. The distal LAD was treated with angioplasty. I  . Cardiac catheterization  May 2014  . Percutaneous coronary stent intervention (pci-s)  April 27 2013  . Vein surgery    . Tee without cardioversion N/A 07/12/2014    Procedure: TRANSESOPHAGEAL ECHOCARDIOGRAM (TEE);  Surgeon: Fay Records, MD;  Location: [facility] ENDOSCOPY;  Service: Cardiovascular;  Laterality: N/A;  . Loop recorder  implant N/A 07/12/2014    Procedure: LOOP RECORDER IMPLANT;  Surgeon: Evans Lance, MD;  Location: [facility] CATH LAB;  Service: Cardiovascular;  Laterality: N/A;  . Abdominal aortagram N/A 09/21/2014    Procedure: ABDOMINAL Maxcine Ham;  Surgeon: Elam Dutch, MD;  Location: [facility] CATH LAB;  Service: Cardiovascular;  Laterality: N/A;  . Lower extremity angiogram  09/21/2014    Procedure: LOWER EXTREMITY ANGIOGRAM;  Surgeon: Elam Dutch, MD;  Location: [facility] CATH LAB;  Service: Cardiovascular;;    History   Social History  . Marital Status: Married    Spouse Name: N/A    Number of Children: N/A  . Years of Education: N/A   Occupational History  . Limo driver    Social History Main Topics  . Smoking status: Never Smoker   . Smokeless tobacco: Never Used  . Alcohol Use: No  . Drug Use: No  . Sexual Activity: Not on file   Other Topics Concern  . Not on file   Social History Narrative   Lives with wife.     Family History  Problem Relation Age of Onset  . Hyperlipidemia Maternal Grandmother   . Asthma Father   . Heart disease Father   . CAD      Multiple siblings    Allergies as of 09/27/2014  . (No Known Allergies)    Current Outpatient Prescriptions on File Prior to Visit  Medication Sig Dispense Refill  . aspirin EC 81 MG tablet Take 81 mg by mouth daily.    Marland Kitchen atorvastatin (LIPITOR) 40 MG tablet Take 1.5 tablets (60 mg total) by mouth daily. 135 tablet 3  . carvedilol (COREG) 6.25 MG tablet Take 1 tablet (6.25 mg total) by mouth 2 (two) times daily with a meal. 60 tablet 5  . cholecalciferol (VITAMIN D) 1000 UNITS tablet Take 1,000 Units by mouth daily.    . clopidogrel (PLAVIX) 75 MG tablet Take 1 tablet (75 mg total) by mouth daily with breakfast. 30 tablet 6  . diphenhydramine-acetaminophen (TYLENOL PM) 25-500 MG TABS Take 1 tablet by mouth at bedtime as needed (for sleep).     . fluticasone (FLONASE) 50 MCG/ACT nasal spray Place 1 spray into both nostrils daily.  16 g 6  . hydroxyurea (HYDREA) 500 MG capsule Take 1 capsule (500 mg total) by mouth 2 (two) times daily. 60 capsule 3  . Insulin Glargine (LANTUS SOLOSTAR) 100 UNIT/ML Solostar Pen Inject 10 Units into the skin 2 (two) times daily. 15 mL 12  . insulin lispro (HUMALOG) 100 UNIT/ML injection Inject 4 Units into the skin 3 (three) times daily before meals.     . isosorbide mononitrate (IMDUR) 30 MG 24 hr tablet Take 30 mg by mouth daily.    Marland Kitchen levETIRAcetam (KEPPRA) 500 MG tablet Take 1 tablet (500 mg total) by mouth 2 (two) times daily. 60 tablet 6  . Linagliptin-Metformin HCl 2.5-500 MG TABS Take 1 tablet by mouth 2 (two) times daily.    Marland Kitchen lisinopril (PRINIVIL,ZESTRIL) 20 MG tablet Take 1 tablet (20 mg total) by mouth daily. 30 tablet 2  . LORazepam (ATIVAN) 0.5 MG tablet Take 1 tablet (0.5 mg total) by mouth every 8 (eight) hours. 30 tablet 5  . metoCLOPramide (REGLAN) 10 MG tablet Take 1 tablet (10 mg total) by mouth every 8 (eight) hours as needed for nausea. 90 tablet 1  . NITROSTAT 0.4 MG SL tablet Place 0.4 mg under the tongue every 5 (five) minutes as needed for chest pain.     Marland Kitchen PROAIR HFA 108 (90 BASE) MCG/ACT inhaler Inhale 1 puff into the lungs every 4 (four) hours as needed for wheezing or shortness of breath.     . Vitamin D, Ergocalciferol, (DRISDOL) 50000 UNITS CAPS capsule Take 50,000 Units by mouth every 7 (seven) days.   0   No current facility-administered medications on file prior to visit.     REVIEW OF SYSTEMS: No changes  PHYSICAL EXAMINATION:   Vital signs are BP 124/66 mmHg  Pulse 69  Temp(Src) 97.5 F (36.4 C)  Resp 24  Ht 5\' 7"  (1.702 m)  Wt 190 lb 12.8 oz (86.546 kg)  BMI 29.88 kg/m2 General: The patient appears their stated age. HEENT:  No gross abnormalities Pulmonary:  Non labored breathing Abdomen: Soft and non-tender Musculoskeletal: There are no major deformities. Neurologic: No focal weakness or paresthesias are detected, Skin: There are no ulcer  or rashes noted. Psychiatric: The patient has normal affect. Cardiovascular: There is a regular rate and rhythm without significant murmur appreciated.   Diagnostic Studies Vein mapping was ordered and reviewed.  He does not have adequate saphenous vein  Assessment: Atherosclerosis with claudication, right greater than left Plan: The patient's angiogram shows a below knee popliteal artery which is a target for bypass, however he has diffuse tibial disease.  In addition he does not have an adequate vein for conduit.  I told him that  since his above-knee Gore-Tex bypass graft lasted 2-3 years.  I doubt we would get any significant improvement in the longevity of the bypass graft by going to the below-knee popliteal artery.  This will probably be his last options for revascularization.  I discussed delaying surgery until he has a more absolute indication for below-knee bypass.  I would like to keep all options on the table in case he were to develop a limb threatening situation.  In addition, he feels that his hip pain is also contributing to his leg problems.  I don't think this is vascular in nature but likely orthopedic.  Therefore, I'm going to refer him to orthopedics for further evaluation of this.  In addition, I'm going to start him on cilostazol to see if this has any benefit.  He will follow-up with me in 3 months.  Eldridge Abrahams, M.D. [facility] [place] Office: [phone] Pager:  [phone]

## 2014-09-28 ENCOUNTER — Other Ambulatory Visit: Payer: Self-pay

## 2014-09-28 ENCOUNTER — Telehealth: Payer: Self-pay | Admitting: Surgery

## 2014-09-28 DIAGNOSIS — M25569 Pain in unspecified knee: Secondary | ICD-10-CM

## 2014-09-28 DIAGNOSIS — I739 Peripheral vascular disease, unspecified: Secondary | ICD-10-CM

## 2014-09-28 NOTE — Telephone Encounter (Signed)
Spoke with pt. 09/29/14 8am appointment with [facility] Dr. Nelva Bush' [place] [phone] [address]. Pt verbalized understanding.

## 2014-09-29 ENCOUNTER — Other Ambulatory Visit: Payer: Medicare Other

## 2014-09-29 ENCOUNTER — Ambulatory Visit: Payer: Medicare Other

## 2014-10-04 ENCOUNTER — Ambulatory Visit: Payer: Medicare Other | Admitting: *Deleted

## 2014-10-05 ENCOUNTER — Encounter: Payer: Self-pay | Admitting: Hematology

## 2014-10-05 ENCOUNTER — Telehealth: Payer: Self-pay | Admitting: Hematology

## 2014-10-05 ENCOUNTER — Other Ambulatory Visit (HOSPITAL_BASED_OUTPATIENT_CLINIC_OR_DEPARTMENT_OTHER): Payer: Medicare Other

## 2014-10-05 ENCOUNTER — Ambulatory Visit (HOSPITAL_BASED_OUTPATIENT_CLINIC_OR_DEPARTMENT_OTHER): Payer: Medicare Other | Admitting: Hematology

## 2014-10-05 VITALS — BP 131/78 | HR 81 | Temp 97.6°F | Resp 19 | Ht 67.0 in | Wt 188.6 lb

## 2014-10-05 DIAGNOSIS — D7581 Myelofibrosis: Secondary | ICD-10-CM

## 2014-10-05 DIAGNOSIS — D751 Secondary polycythemia: Secondary | ICD-10-CM

## 2014-10-05 LAB — CBC WITH DIFFERENTIAL/PLATELET
BASO%: 0.2 % (ref 0.0–2.0)
Basophils Absolute: 0 10*3/uL (ref 0.0–0.1)
EOS%: 1.7 % (ref 0.0–7.0)
Eosinophils Absolute: 0.2 10*3/uL (ref 0.0–0.5)
HCT: 44.9 % (ref 38.4–49.9)
HGB: 13.5 g/dL (ref 13.0–17.1)
LYMPH%: 9.1 % — ABNORMAL LOW (ref 14.0–49.0)
MCH: 24.9 pg — ABNORMAL LOW (ref 27.2–33.4)
MCHC: 30.1 g/dL — ABNORMAL LOW (ref 32.0–36.0)
MCV: 82.7 fL (ref 79.3–98.0)
MONO#: 0.7 10*3/uL (ref 0.1–0.9)
MONO%: 5.1 % (ref 0.0–14.0)
NEUT#: 12.1 10*3/uL — ABNORMAL HIGH (ref 1.5–6.5)
NEUT%: 83.9 % — ABNORMAL HIGH (ref 39.0–75.0)
Platelets: 300 10*3/uL (ref 140–400)
RBC: 5.43 10*6/uL (ref 4.20–5.82)
RDW: 19.6 % — ABNORMAL HIGH (ref 11.0–14.6)
WBC: 14.4 10*3/uL — ABNORMAL HIGH (ref 4.0–10.3)
lymph#: 1.3 10*3/uL (ref 0.9–3.3)

## 2014-10-05 MED ORDER — HYDROXYUREA 500 MG PO CAPS: 500.0000 mg | ORAL_CAPSULE | Freq: Two times a day (BID) | ORAL | Status: DC

## 2014-10-05 NOTE — Telephone Encounter (Signed)
Pt confirmed labs/ov per 12/29 POF, gave pt AVS.... KJ °

## 2014-10-05 NOTE — Progress Notes (Addendum)
[facility] PROGRESS NOTE Date of Visit: 07/28/2014  [name], MD [phone] [address], [address] [place] [address]  DIAGNOSIS: Myelofibrosis/Myeloproliferative disorder (Overlap). JAK2+.  CHIEF COMPLAINS: follow up   CURRENT TREATMENT:    1.Hydrea 500 mg twice daily. 2. Aspirin daily.    Myelofibrosis   12/26/2012 Bone Marrow Biopsy Showed hypercellularity with reticulum fibrosis, JAK-2 mutation positive, BCR/ABL negative, 62 XY in 20 metaphase, and Intermediate-1 ,DIPSS of 2. (WBC < 25; Hbg > 10;    01/02/2013 Imaging US abdomen complete revealed Mild splenogmealy (13.3 cm) and a 2.2 x 1.7 x 1.8 cm left renal cyst   01/15/2013 Pathology Results Integrated hematopathology report.  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    03/17/2013 Imaging CT Chest: Crowded peribronchial markings noted, associated with linear densities involving the left base, probably representing areas of platelike atelectasis/scarring.  No mass or infiltrate seen.    03/19/2013 - 07/20/2013 Chemotherapy Started Jakafi 20 mg bid. Dose reduced due to anemia.  Stopped due to increasing fatigue while on jakafi.    04/28/2013 - 05/01/2013 Hospital Admission Admitted to [facility] by Dr. Francia Greaves, his cardiologist, due to the fact that he had midsternal chest pain.  He had a coronary stent placed this admission.    05/28/2013 - 05/29/2013 Hospital Admission Admitted to [facility] and received 3 units of blood (Dr. Lendon Colonel note).    06/10/2013 Surgery Cololonoscopy due to GIB.  C/w diverticular disease and hemorrhoids but no active bleeding.    10/05/2013 -  Chemotherapy Started Hydrea 500 mg daily.   03/19/2014 -  Chemotherapy Hydrea increased to 500 mg bid wbc 17, hb 17, hct 54, plt 437   06/29/2014 Procedure cbc shows wbc  18.8 hb 18.7 hct 58.9 plt 456. ?compliance w hydrea as MCV not elevated. symptomatic start TP weekly x 4   07/28/2014 Procedure TP every 2 weeks if hct > 45     INTERVAL HISTORY:  Tyler Whitehead 73 y.o. male with myleofibrosis who returns follow up. He had a mild stroke and seizure months ago, no residual neuro deficits. He is otherwise is doing well overall. He denies any pain, cough, abdominal discomfort, fever or chills, recent weight loss. He has moderate fatigue, for which he contributes to his age and medications. He is able to take care of his daily need and tolerating routine activities well. His wife is quite insist about him going to [place] for exercise routinely, and he would prefer just taking walk at out door.  MEDICAL HISTORY: Past Medical History  Diagnosis Date  . Primary myelofibrosis   . Diverticular disease   . Hemorrhoid   . CAD (coronary artery disease)     a. S/p 3V CABG 2000 with LIMA-LAD, left radial-PDA, SVG-OM1. b. 2014: stent to SVG-OM1; then found to have obstruction distal to LAD anastamosis of LIMA s/p DES.   Marland Kitchen Peripheral vascular disease     a. R femoropopliteal bypass ~2012 in [place], reportedly shown to be occluded. Now followed by Dr. [name].  . DM (diabetes mellitus)   . Hyperlipidemia   . HTN (hypertension)   . RBBB      ALLERGIES:  has No Known Allergies.  MEDICATIONS: has a current medication list which includes the following prescription(s): aspirin ec, atorvastatin, carvedilol, cholecalciferol, clopidogrel, diphenhydramine-acetaminophen, fluticasone, hydroxyurea, insulin glargine, insulin lispro, isosorbide mononitrate, levetiracetam, linagliptin-metformin hcl, lisinopril, lorazepam, metoclopramide,  nitrostat, proair hfa, and vitamin d (ergocalciferol).  SURGICAL HISTORY:  Past Surgical History  Procedure Laterality Date  . Coronary artery bypass graft  2000    LM occluded, LIMA to LAD patent but distal native LAD disease, saphenous vein bypass graft  to OM with 95% stenosis in the midportion, right coronary artery occluded, radial artery graft to right coronary artery patent but was diffusely small. Marginal graft stented. 7 2014. Of note he had multiple previous stents in the right coronary artery. The distal LAD was treated with angioplasty. I  . Cardiac catheterization  May 2014  . Percutaneous coronary stent intervention (pci-s)  April 27 2013  . Vein surgery    . Tee without cardioversion N/A 07/12/2014    Procedure: TRANSESOPHAGEAL ECHOCARDIOGRAM (TEE);  Surgeon: Fay Records, MD;  Location: [facility] ENDOSCOPY;  Service: Cardiovascular;  Laterality: N/A;  . Loop recorder implant N/A 07/12/2014    Procedure: LOOP RECORDER IMPLANT;  Surgeon: Evans Lance, MD;  Location: [facility] CATH LAB;  Service: Cardiovascular;  Laterality: N/A;  . Abdominal aortagram N/A 09/21/2014    Procedure: ABDOMINAL Maxcine Ham;  Surgeon: Elam Dutch, MD;  Location: [facility] CATH LAB;  Service: Cardiovascular;  Laterality: N/A;  . Lower extremity angiogram  09/21/2014    Procedure: LOWER EXTREMITY ANGIOGRAM;  Surgeon: Elam Dutch, MD;  Location: [facility] CATH LAB;  Service: Cardiovascular;;    REVIEW OF SYSTEMS:   Constitutional: Denies fevers, chills or abnormal weight loss, (+) fatigue  Eyes: Admits to blurriness of vision and floaters Ears, nose, mouth, throat, and face: Denies mucositis or sore throat Respiratory: Denies cough, dyspnea or wheezes Cardiovascular: Denies palpitation, chest discomfort or lower extremity swelling Gastrointestinal:  Denies nausea, heartburn or change in bowel habits Skin: Denies abnormal skin rashes Lymphatics: Denies new lymphadenopathy or easy bruising Neurological:Denies numbness, tingling or new weaknesses Behavioral/Psych: Mood is stable, no new changes  All other systems were reviewed with the patient and are negative.  PHYSICAL EXAMINATION: ECOG PERFORMANCE STATUS: 0  Blood pressure 131/78, pulse 81, temperature 97.6 F (36.4  C), temperature source Oral, resp. rate 19, height _0  (1.702 m), weight 188 lb 9.6 oz (85.548 kg), SpO2 99 %.  GENERAL:alert, no distress and comfortable; elderly male who appears his stated age.  SKIN: skin color, texture, turgor are normal, no rashes or significant lesions EYES: normal, Conjunctiva are pink and non-injected, sclera clear OROPHARYNX:no exudate, no erythema and lips, buccal mucosa, and tongue normal  NECK: supple, thyroid normal size, non-tender, without nodularity LYMPH:  no palpable lymphadenopathy in the cervical, axillary or supraclavicular LUNGS: clear to auscultation with normal breathing effort, no wheezes or rhonchi HEART: regular rate & rhythm and no murmurs and no lower extremity edema ABDOMEN:abdomen soft, non-tender and normal bowel sounds Musculoskeletal:no cyanosis of digits and no clubbing  NEURO: alert & oriented x 3 with fluent speech, no focal motor/sensory deficits  Labs:  CBC Latest Ref Rng 10/05/2014 09/21/2014 09/08/2014  WBC 4.0 - 10.3 10e3/uL 14.4(H) - 21.4(H)  Hemoglobin 13.0 - 17.1 g/dL 13.5 16.3 14.0  Hematocrit 38.4 - 49.9 % 44.9 48.0 46.9  Platelets 140 - 400 10e3/uL 300 - 409(H)    CMP Latest Ref Rng 09/21/2014 07/28/2014 07/13/2014  Glucose 70 - 99 mg/dL 144(H) 135(H) 244(H)  BUN 6 - 23 mg/dL _1 Creatinine 0.50 - 1.35 mg/dL 1.30 0.93 0.96  Sodium 137 - 147 mEq/L 139 141 138  Potassium 3.7 - 5.3 mEq/L 4.3 5.8(H) 4.3  Chloride 96 -  112 mEq/L 102 104 101  CO2 19 - 32 mEq/L - 29 27  Calcium 8.4 - 10.5 mg/dL - 9.7 8.8  Total Protein 6.0 - 8.3 g/dL - - -  Total Bilirubin 0.3 - 1.2 mg/dL - - -  Alkaline Phos 39 - 117 U/L - - -  AST 0 - 37 U/L - - -  ALT 0 - 53 U/L - - -     RADIOGRAPHIC STUDIES: No results found.  ASSESSMENT: Tyler Whitehead 73 y.o. male with a history of Myelofibrosis, jak 2 mutation and symptomatic polycythemia. Wbc and platelets are high as well. Questionable compliance issue with Hydrea.  PLAN:   1.  Primary Myeloproliferative neoplasm (Myelofibrosis).  -We discussed that this is not curable disease, but treatable. There is a small percentage patient will develop acute leukemia or a plastic anemia later on. --Clinically, he is doing well. His white count and plate count has come down significantly compared to 2 months ago. He is probably compliant with Hydrea daily in the past few months. -I discussed that he is at high risk for thrombosis secondary to his disease. -We again emphasized the importance of continued Hydrea and normalized his blood counts to prevent further stroke or thrombosis. He agrees to continue Hydrea and compliant with her treatment.  2. CADz s/p CABG x 3, hypertension  --Continue lisinopril, coreg, aspirin   3. PvDz.  --Continue Imdur as it appearing to have improved some of his claudication symptoms.   4. Dyslipidemia.  --Continue statin therapy.   5. Stroke and seizure in 07/2014 -- follow up with his neurologist   6. Follow-up.  --Patient will have labs drawn every month and return for follow up in 2 month.    All questions were answered. The patient knows to call the clinic with any problems, questions or concerns. We can certainly see the patient much sooner if necessary.  I spent 15 minutes counseling the patient face to face. The total time spent in the appointment was 20 minutes.  Truitt Merle 10/05/2014

## 2014-10-11 ENCOUNTER — Ambulatory Visit (INDEPENDENT_AMBULATORY_CARE_PROVIDER_SITE_OTHER): Payer: Medicare HMO | Admitting: *Deleted

## 2014-10-11 DIAGNOSIS — I6330 Cerebral infarction due to thrombosis of unspecified cerebral artery: Secondary | ICD-10-CM

## 2014-10-11 LAB — MDC_IDC_ENUM_SESS_TYPE_REMOTE

## 2014-10-15 NOTE — Progress Notes (Signed)
Loop recorder 

## 2014-10-18 ENCOUNTER — Telehealth: Payer: Self-pay | Admitting: Cardiovascular Disease

## 2014-10-18 MED ORDER — NITROGLYCERIN 0.4 MG SL SUBL: 0.4000 mg | SUBLINGUAL_TABLET | SUBLINGUAL | Status: DC | PRN

## 2014-10-18 NOTE — Telephone Encounter (Signed)
Marzetta Board from [facility] wanted to let us know that patient had an episode of severe left chest pain 3 weeks ago which lasted for about 15 minutes, pain was not associated with additional symptoms such as sweating left arm or jaw pain or shortness of breath. He sates he did not take his nitro because it got washed up in the laundry . Mr. Waynard Reeds has not had any symptoms since that time and would like to know how would Dr. [name] like to follow up. Mr. Earwood does need to have the nitro refilled . Please call    Thanks

## 2014-10-18 NOTE — Telephone Encounter (Signed)
Spoke with pt, aware script for NTG has been sent to the pharm. He has had no other episodes of chest pain but states he just does not feel good. Follow up scheduled with dr hochrein.

## 2014-10-19 ENCOUNTER — Ambulatory Visit (INDEPENDENT_AMBULATORY_CARE_PROVIDER_SITE_OTHER): Payer: Commercial Managed Care - HMO | Admitting: Cardiology

## 2014-10-19 ENCOUNTER — Other Ambulatory Visit: Payer: Self-pay | Admitting: Cardiology

## 2014-10-19 ENCOUNTER — Encounter: Payer: Self-pay | Admitting: Internal Medicine

## 2014-10-19 ENCOUNTER — Encounter: Payer: Self-pay | Admitting: Cardiology

## 2014-10-19 VITALS — BP 120/70 | HR 82 | Ht 67.0 in | Wt 196.6 lb

## 2014-10-19 DIAGNOSIS — I2510 Atherosclerotic heart disease of native coronary artery without angina pectoris: Secondary | ICD-10-CM | POA: Diagnosis not present

## 2014-10-19 MED ORDER — CILOSTAZOL 50 MG PO TABS: 50.0000 mg | ORAL_TABLET | Freq: Two times a day (BID) | ORAL | Status: DC

## 2014-10-19 NOTE — Progress Notes (Signed)
HPI The patient presents for evaluation of chest pain. Marland Kitchen  He has a complicated past history of CAD with treatment in [place].  He has CABG and PVD.   I have reveiwed the outside records previously.  His last cath in [place] suggested that "Medical therapy should be enhanced. If the patient has ongoing exertional chest discomfort despite maximal medical management and if he has evidence of inferior ischemia on a perfusion study then repeat PCI of the native right coronary artery could be considered. However, given the patient's track record the right coronary artery is not likely to remain open even if PCI could be accomplished."   Since I last saw him he did have an lower extremity angiogram and was scheduled to see Dr. Trula Slade to consider redo peripheral vascular surgery. I reviewed this note and it was decided that they would manage him conservatively and sent him to an orthopedist to evaluate some of his lower extremity pain. He was supposed to be started on Pletal. The patient has had no new cardiovascular complaints. He denies any chest pressure, neck or arm discomfort. He's had no palpitations, presyncope or syncope.  No Known Allergies  Current Outpatient Prescriptions  Medication Sig Dispense Refill  . aspirin EC 81 MG tablet Take 81 mg by mouth daily.    Marland Kitchen atorvastatin (LIPITOR) 40 MG tablet Take 1.5 tablets (60 mg total) by mouth daily. 135 tablet 3  . carvedilol (COREG) 6.25 MG tablet Take 1 tablet (6.25 mg total) by mouth 2 (two) times daily with a meal. 60 tablet 5  . cholecalciferol (VITAMIN D) 1000 UNITS tablet Take 1,000 Units by mouth daily.    . clopidogrel (PLAVIX) 75 MG tablet Take 1 tablet (75 mg total) by mouth daily with breakfast. 30 tablet 6  . fluticasone (FLONASE) 50 MCG/ACT nasal spray Place 1 spray into both nostrils daily. 16 g 6  . FLUVIRIN SUSP   0  . hydroxyurea (HYDREA) 500 MG capsule Take 1 capsule (500 mg total) by mouth 2 (two) times daily. 60 capsule 3  . Insulin  Glargine (LANTUS SOLOSTAR) 100 UNIT/ML Solostar Pen Inject 10 Units into the skin 2 (two) times daily. 15 mL 12  . insulin lispro (HUMALOG) 100 UNIT/ML injection Inject 4 Units into the skin 3 (three) times daily before meals.     . isosorbide mononitrate (IMDUR) 30 MG 24 hr tablet Take 30 mg by mouth daily.    Marland Kitchen levETIRAcetam (KEPPRA) 500 MG tablet Take 1 tablet (500 mg total) by mouth 2 (two) times daily. 60 tablet 6  . Linagliptin-Metformin HCl 2.5-500 MG TABS Take 1 tablet by mouth 2 (two) times daily.    Marland Kitchen lisinopril (PRINIVIL,ZESTRIL) 20 MG tablet Take 1 tablet (20 mg total) by mouth daily. 30 tablet 2  . nitroGLYCERIN (NITROSTAT) 0.4 MG SL tablet Place 1 tablet (0.4 mg total) under the tongue every 5 (five) minutes as needed for chest pain. 25 tablet 12  . PROAIR HFA 108 (90 BASE) MCG/ACT inhaler Inhale 1 puff into the lungs every 4 (four) hours as needed for wheezing or shortness of breath.     . Vitamin D, Ergocalciferol, (DRISDOL) 50000 UNITS CAPS capsule Take 50,000 Units by mouth every 7 (seven) days.   0   No current facility-administered medications for this visit.    Past Medical History  Diagnosis Date  . Primary myelofibrosis   . Diverticular disease   . Hemorrhoid   . CAD (coronary artery disease)  a. S/p 3V CABG 2000 with LIMA-LAD, left radial-PDA, SVG-OM1. b. 2014: stent to SVG-OM1; then found to have obstruction distal to LAD anastamosis of LIMA s/p DES.   Marland Kitchen Peripheral vascular disease     a. R femoropopliteal bypass ~2012 in [place], reportedly shown to be occluded. Now followed by Dr. [name].  . DM (diabetes mellitus)   . Hyperlipidemia   . HTN (hypertension)   . RBBB     Past Surgical History  Procedure Laterality Date  . Coronary artery bypass graft  2000    LM occluded, LIMA to LAD patent but distal native LAD disease, saphenous vein bypass graft to OM with 95% stenosis in the midportion, right coronary artery occluded, radial artery graft to right coronary  artery patent but was diffusely small. Marginal graft stented. 7 2014. Of note he had multiple previous stents in the right coronary artery. The distal LAD was treated with angioplasty. I  . Cardiac catheterization  May 2014  . Percutaneous coronary stent intervention (pci-s)  April 27 2013  . Vein surgery    . Tee without cardioversion N/A 07/12/2014    Procedure: TRANSESOPHAGEAL ECHOCARDIOGRAM (TEE);  Surgeon: Fay Records, MD;  Location: [facility] ENDOSCOPY;  Service: Cardiovascular;  Laterality: N/A;  . Loop recorder implant N/A 07/12/2014    Procedure: LOOP RECORDER IMPLANT;  Surgeon: Evans Lance, MD;  Location: [facility] CATH LAB;  Service: Cardiovascular;  Laterality: N/A;  . Abdominal aortagram N/A 09/21/2014    Procedure: ABDOMINAL Maxcine Ham;  Surgeon: Elam Dutch, MD;  Location: [facility] CATH LAB;  Service: Cardiovascular;  Laterality: N/A;  . Lower extremity angiogram  09/21/2014    Procedure: LOWER EXTREMITY ANGIOGRAM;  Surgeon: Elam Dutch, MD;  Location: [facility] CATH LAB;  Service: Cardiovascular;;    ROS:  As stated in the HPI and negative for all other systems.  PHYSICAL EXAM BP 120/70 mmHg  Pulse 82  Ht 5\' 7"  (1.702 m)  Wt 196 lb 9.6 oz (89.177 kg)  BMI 30.78 kg/m2 GENERAL:  Well appearing HEENT:  Pupils equal round and reactive, fundi not visualized, oral mucosa unremarkable NECK:  No jugular venous distention, waveform within normal limits, carotid upstroke brisk and symmetric, no bruits, no thyromegaly LYMPHATICS:  No cervical, inguinal adenopathy LUNGS:  Clear to auscultation bilaterally BACK:  No CVA tenderness CHEST:  Well healed sternotomy scar. HEART:  PMI not displaced or sustained,S1 and S2 within normal limits, no S3, no S4, no clicks, no rubs, no murmurs ABD:  Flat, positive bowel sounds normal in frequency in pitch, no bruits, no rebound, no guarding, no midline pulsatile mass, no hepatomegaly, no splenomegaly EXT:  2 plus pulses upper, absent left radial, absent  femoral, DP/PT, no edema, no cyanosis no clubbing SKIN:  No rashes no nodules   EKG:  Normal sinus rhythm, rate 82, right bundle branch block, no acute ST-T wave changes. 10/19/2014   ASSESSMENT AND PLAN  CAD:  He has no active chest pain.  He will continue with risk reduction.  No change in therapy or further imaging is indicated.   HTN:  The blood pressure is at target. No change in medications is indicated. We will continue with therapeutic lifestyle changes (TLC).  DM:   Per [name], MD    PVD:  Per Dr. Trula Slade.  I will start the Pletal.  HYPERLIPIDEMIA:  His LDL was 40 in October.  He will continue with current meds.

## 2014-10-19 NOTE — Patient Instructions (Signed)
Your physician has recommended making the following medication changes: START Pletal 50 mg - take 1 tablet twice daily. A prescription has been sent to your [facility].  Dr Percival Spanish wants you to follow-up in 6 months. You will receive a reminder letter in the mail one months in advance. If you don't receive a letter, please call our office to schedule the follow-up appointment.

## 2014-10-21 ENCOUNTER — Other Ambulatory Visit (HOSPITAL_COMMUNITY): Payer: Self-pay | Admitting: Family Medicine

## 2014-10-21 ENCOUNTER — Other Ambulatory Visit: Payer: Self-pay | Admitting: Physician Assistant

## 2014-10-21 DIAGNOSIS — R1310 Dysphagia, unspecified: Secondary | ICD-10-CM

## 2014-10-21 DIAGNOSIS — I739 Peripheral vascular disease, unspecified: Secondary | ICD-10-CM

## 2014-10-28 ENCOUNTER — Ambulatory Visit
Admission: RE | Admit: 2014-10-28 | Discharge: 2014-10-28 | Disposition: A | Discharge status: Home / Self Care | Payer: Commercial Managed Care - HMO | Source: Ambulatory Visit | Attending: Physician Assistant | Admitting: Physician Assistant

## 2014-10-28 DIAGNOSIS — I739 Peripheral vascular disease, unspecified: Secondary | ICD-10-CM

## 2014-10-29 ENCOUNTER — Ambulatory Visit (HOSPITAL_COMMUNITY): Payer: Commercial Managed Care - HMO

## 2014-10-29 ENCOUNTER — Other Ambulatory Visit (HOSPITAL_COMMUNITY): Payer: Commercial Managed Care - HMO

## 2014-11-01 ENCOUNTER — Other Ambulatory Visit: Payer: Self-pay | Admitting: *Deleted

## 2014-11-01 DIAGNOSIS — D45 Polycythemia vera: Secondary | ICD-10-CM

## 2014-11-01 DIAGNOSIS — D7581 Myelofibrosis: Secondary | ICD-10-CM

## 2014-11-02 ENCOUNTER — Encounter: Payer: Self-pay | Admitting: Dietician

## 2014-11-02 ENCOUNTER — Other Ambulatory Visit: Payer: Medicare Other

## 2014-11-02 ENCOUNTER — Encounter: Payer: Medicare HMO | Attending: Family | Admitting: Dietician

## 2014-11-02 ENCOUNTER — Ambulatory Visit (HOSPITAL_BASED_OUTPATIENT_CLINIC_OR_DEPARTMENT_OTHER): Payer: Medicare HMO

## 2014-11-02 VITALS — Ht 67.0 in | Wt 190.0 lb

## 2014-11-02 DIAGNOSIS — E559 Vitamin D deficiency, unspecified: Secondary | ICD-10-CM | POA: Insufficient documentation

## 2014-11-02 DIAGNOSIS — I739 Peripheral vascular disease, unspecified: Secondary | ICD-10-CM | POA: Diagnosis not present

## 2014-11-02 DIAGNOSIS — Z79899 Other long term (current) drug therapy: Secondary | ICD-10-CM | POA: Insufficient documentation

## 2014-11-02 DIAGNOSIS — E1140 Type 2 diabetes mellitus with diabetic neuropathy, unspecified: Secondary | ICD-10-CM | POA: Diagnosis not present

## 2014-11-02 DIAGNOSIS — E1165 Type 2 diabetes mellitus with hyperglycemia: Secondary | ICD-10-CM

## 2014-11-02 DIAGNOSIS — D45 Polycythemia vera: Secondary | ICD-10-CM

## 2014-11-02 DIAGNOSIS — D7581 Myelofibrosis: Secondary | ICD-10-CM

## 2014-11-02 DIAGNOSIS — E119 Type 2 diabetes mellitus without complications: Secondary | ICD-10-CM | POA: Diagnosis present

## 2014-11-02 DIAGNOSIS — Z713 Dietary counseling and surveillance: Secondary | ICD-10-CM | POA: Diagnosis not present

## 2014-11-02 DIAGNOSIS — Z794 Long term (current) use of insulin: Secondary | ICD-10-CM | POA: Insufficient documentation

## 2014-11-02 LAB — CBC WITH DIFFERENTIAL/PLATELET
BASO%: 1.1 % (ref 0.0–2.0)
Basophils Absolute: 0.2 10*3/uL — ABNORMAL HIGH (ref 0.0–0.1)
EOS%: 2.5 % (ref 0.0–7.0)
Eosinophils Absolute: 0.5 10*3/uL (ref 0.0–0.5)
HCT: 46.4 % (ref 38.4–49.9)
HGB: 14.1 g/dL (ref 13.0–17.1)
LYMPH%: 7.8 % — ABNORMAL LOW (ref 14.0–49.0)
MCH: 25.5 pg — ABNORMAL LOW (ref 27.2–33.4)
MCHC: 30.4 g/dL — ABNORMAL LOW (ref 32.0–36.0)
MCV: 83.9 fL (ref 79.3–98.0)
MONO#: 1.1 10*3/uL — ABNORMAL HIGH (ref 0.1–0.9)
MONO%: 5.3 % (ref 0.0–14.0)
NEUT#: 17.8 10*3/uL — ABNORMAL HIGH (ref 1.5–6.5)
NEUT%: 83.3 % — ABNORMAL HIGH (ref 39.0–75.0)
Platelets: 396 10*3/uL (ref 140–400)
RBC: 5.53 10*6/uL (ref 4.20–5.82)
RDW: 16.9 % — ABNORMAL HIGH (ref 11.0–14.6)
WBC: 21.4 10*3/uL — ABNORMAL HIGH (ref 4.0–10.3)
lymph#: 1.7 10*3/uL (ref 0.9–3.3)
nRBC: 1 % — ABNORMAL HIGH (ref 0–0)

## 2014-11-02 NOTE — Patient Instructions (Signed)
Plan:  Try to have regularly scheduled meals.  Aim for 3-4 Carb Choices per meal (45-60 grams) +/- 1 either way  Aim for 0-2 Carbs per snack if hungry  Include protein in moderation with your meals and snacks Consider reading food labels for Total Carbohydrate and Fat Grams of foods Consider  increasing your activity level by walking for 30 minutes daily as tolerated Consider checking BG at alternate times per day as directed by MD  Consider taking medication  as directed by MD

## 2014-11-02 NOTE — Progress Notes (Signed)
  Medical Nutrition Therapy:  Appt start time: 0930 end time:  1045.   Assessment:  Primary concerns today: Improvement of blood sugar control.  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  Patient with PVD and vitamin D deficiency, chronic constipation.  Sees oncologist for polycythemia.  Hx of stroke in October. Patient lives with wife.  Wife and patient shop and cook.  Wife smokes.  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  Now with new medical insurance and stressed about the bills.  Several deaths in family within last month.  Travels a lot.  HgbA1C decreased from 12.9% (07/2014) to 8.4% (10/18/2014).  Preferred Learning Style:   No preference indicated   Learning Readiness:   Ready  MEDICATIONS: see list   DIETARY INTAKE:  Usual eating pattern includes 2-3 meals and 1 snacks per day. Avoided foods include chicken.    24-hr recall:  B ( AM): 1 egg, 1/4-1/2 cup fried potatoes, 1/2 slice white toast, 1 slice bacon, 4 oz juice, 8 oz water, coffee with sugar sub Snk (AM):  L ( PM): crackers and cheese and vegetable soup Snk ( PM): applesauce D ( PM): meat, baked potato, vegetables or soup Snk ( PM): occasional nuts Beverages: water, 4 oz juice in am, rare beer, coffee with sugar sub or tea with sugar sub  Usual physical activity: walks 1 mile 1-2 x per day.  Estimated energy needs: 1800 calories 200 g carbohydrates 113 g protein 60 g fat  Progress Towards Goal(s):  In progress.   Nutritional Diagnosis:  NB-1.1 Food and nutrition-related knowledge deficit As related to the balance of carbohydrates, protein, and fat.  As evidenced by diet hx and patient report.    Intervention:  Nutrition counseling and diabetes education initiated. Discussed Carb Counting by food group as method of portion control, reading food labels, and benefits of increased activity. Also discussed basic physiology of Diabetes, target BG ranges pre and post meals, and  A1c.   Plan:  Try to have regularly scheduled meals.  Aim for 3-4 Carb Choices per meal (45-60 grams) +/- 1 either way  Aim for 0-2 Carbs per snack if hungry  Include protein in moderation with your meals and snacks Consider reading food labels for Total Carbohydrate and Fat Grams of foods Consider  increasing your activity level by walking for 30 minutes daily as tolerated Consider checking BG at alternate times per day as directed by MD  Consider taking medication  as directed by MD  Teaching Method Utilized:  Visual Auditory Hands on  Handouts given during visit include: Food Label handouts Meal Plan Card My Plate  F6B sheet  Barriers to learning/adherence to lifestyle change: patient forgets medicine at times  Demonstrated degree of understanding via:  Teach Back   Monitoring/Evaluation:  Dietary intake, exercise, label reading, weight in 3 month(s).

## 2014-11-03 ENCOUNTER — Inpatient Hospital Stay (HOSPITAL_COMMUNITY): Admission: RE | Admit: 2014-11-03 | Payer: Commercial Managed Care - HMO | Source: Ambulatory Visit

## 2014-11-03 ENCOUNTER — Ambulatory Visit (HOSPITAL_COMMUNITY)
Admission: RE | Admit: 2014-11-03 | Discharge: 2014-11-03 | Disposition: A | Discharge status: Home / Self Care | Payer: Commercial Managed Care - HMO | Source: Ambulatory Visit | Attending: Physician Assistant | Admitting: Physician Assistant

## 2014-11-09 ENCOUNTER — Encounter: Payer: Self-pay | Admitting: Internal Medicine

## 2014-11-09 ENCOUNTER — Ambulatory Visit (INDEPENDENT_AMBULATORY_CARE_PROVIDER_SITE_OTHER): Payer: Medicare HMO | Admitting: *Deleted

## 2014-11-09 DIAGNOSIS — I6330 Cerebral infarction due to thrombosis of unspecified cerebral artery: Secondary | ICD-10-CM | POA: Diagnosis not present

## 2014-11-12 DIAGNOSIS — J309 Allergic rhinitis, unspecified: Secondary | ICD-10-CM | POA: Diagnosis not present

## 2014-11-12 NOTE — Progress Notes (Signed)
Loop recorder 

## 2014-11-23 DIAGNOSIS — M25611 Stiffness of right shoulder, not elsewhere classified: Secondary | ICD-10-CM | POA: Diagnosis not present

## 2014-11-23 DIAGNOSIS — M25511 Pain in right shoulder: Secondary | ICD-10-CM | POA: Diagnosis not present

## 2014-11-23 DIAGNOSIS — M25612 Stiffness of left shoulder, not elsewhere classified: Secondary | ICD-10-CM | POA: Diagnosis not present

## 2014-11-23 DIAGNOSIS — M25512 Pain in left shoulder: Secondary | ICD-10-CM | POA: Diagnosis not present

## 2014-11-25 LAB — MDC_IDC_ENUM_SESS_TYPE_REMOTE
Date Time Interrogation Session: 20160122132853
Zone Setting Detection Interval: 2000 ms
Zone Setting Detection Interval: 3000 ms
Zone Setting Detection Interval: 380 ms

## 2014-11-30 ENCOUNTER — Telehealth: Payer: Self-pay | Admitting: Hematology

## 2014-11-30 ENCOUNTER — Other Ambulatory Visit (HOSPITAL_BASED_OUTPATIENT_CLINIC_OR_DEPARTMENT_OTHER): Payer: Medicare HMO

## 2014-11-30 ENCOUNTER — Encounter: Payer: Self-pay | Admitting: Hematology

## 2014-11-30 ENCOUNTER — Ambulatory Visit (HOSPITAL_BASED_OUTPATIENT_CLINIC_OR_DEPARTMENT_OTHER): Payer: Medicare HMO | Admitting: Hematology

## 2014-11-30 VITALS — BP 139/67 | HR 89 | Temp 98.6°F | Resp 18 | Ht 67.0 in | Wt 194.5 lb

## 2014-11-30 DIAGNOSIS — M25512 Pain in left shoulder: Secondary | ICD-10-CM | POA: Diagnosis not present

## 2014-11-30 DIAGNOSIS — D45 Polycythemia vera: Secondary | ICD-10-CM

## 2014-11-30 DIAGNOSIS — D7581 Myelofibrosis: Secondary | ICD-10-CM

## 2014-11-30 DIAGNOSIS — D751 Secondary polycythemia: Secondary | ICD-10-CM | POA: Diagnosis not present

## 2014-11-30 DIAGNOSIS — M25612 Stiffness of left shoulder, not elsewhere classified: Secondary | ICD-10-CM | POA: Diagnosis not present

## 2014-11-30 DIAGNOSIS — M25611 Stiffness of right shoulder, not elsewhere classified: Secondary | ICD-10-CM | POA: Diagnosis not present

## 2014-11-30 DIAGNOSIS — M25511 Pain in right shoulder: Secondary | ICD-10-CM | POA: Diagnosis not present

## 2014-11-30 LAB — CBC WITH DIFFERENTIAL/PLATELET
BASO%: 1.1 % (ref 0.0–2.0)
Basophils Absolute: 0.2 10*3/uL — ABNORMAL HIGH (ref 0.0–0.1)
EOS%: 2.6 % (ref 0.0–7.0)
Eosinophils Absolute: 0.5 10*3/uL (ref 0.0–0.5)
HCT: 47.5 % (ref 38.4–49.9)
HGB: 14.2 g/dL (ref 13.0–17.1)
LYMPH%: 9.3 % — ABNORMAL LOW (ref 14.0–49.0)
MCH: 24.5 pg — ABNORMAL LOW (ref 27.2–33.4)
MCHC: 29.9 g/dL — ABNORMAL LOW (ref 32.0–36.0)
MCV: 82 fL (ref 79.3–98.0)
MONO#: 1.1 10*3/uL — ABNORMAL HIGH (ref 0.1–0.9)
MONO%: 5.5 % (ref 0.0–14.0)
NEUT#: 16.2 10*3/uL — ABNORMAL HIGH (ref 1.5–6.5)
NEUT%: 81.5 % — ABNORMAL HIGH (ref 39.0–75.0)
Platelets: 405 10*3/uL — ABNORMAL HIGH (ref 140–400)
RBC: 5.79 10*6/uL (ref 4.20–5.82)
RDW: 17 % — ABNORMAL HIGH (ref 11.0–14.6)
WBC: 19.9 10*3/uL — ABNORMAL HIGH (ref 4.0–10.3)
lymph#: 1.8 10*3/uL (ref 0.9–3.3)
nRBC: 0 % (ref 0–0)

## 2014-11-30 NOTE — Progress Notes (Signed)
[name] [facility] PROGRESS NOTE Date of Visit: 07/28/2014  Tyler Copa, MD [phone], [address] [place] [address]  DIAGNOSIS: Myelofibrosis/Myeloproliferative disorder (Overlap). JAK2+.  CHIEF COMPLAINS: follow up   CURRENT TREATMENT:    1.Hydrea 500 mg twice daily. 2. Aspirin daily.    Myelofibrosis   12/26/2012 Bone Marrow Biopsy Showed hypercellularity with reticulum fibrosis, JAK-2 mutation positive, BCR/ABL negative, 24 XY in 20 metaphase, and Intermediate-1 ,DIPSS of 2. (WBC < 25; Hbg > 10;    01/02/2013 Imaging US abdomen complete revealed Mild splenogmealy (13.3 cm) and a 2.2 x 1.7 x 1.8 cm left renal cyst   01/15/2013 Pathology Results Integrated hematopathology report.  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    03/17/2013 Imaging CT Chest: Crowded peribronchial markings noted, associated with linear densities involving the left base, probably representing areas of platelike atelectasis/scarring.  No mass or infiltrate seen.    03/19/2013 - 07/20/2013 Chemotherapy Started Jakafi 20 mg bid. Dose reduced due to anemia.  Stopped due to increasing fatigue while on jakafi.    04/28/2013 - 05/01/2013 Hospital Admission Admitted to [facility] by Dr. Francia Greaves, his cardiologist, due to the fact that he had midsternal chest pain.  He had a coronary stent placed this admission.    05/28/2013 - 05/29/2013 Hospital Admission Admitted to [facility] and received 3 units of blood (Dr. Lendon Colonel note).    06/10/2013 Surgery Cololonoscopy due to GIB.  C/w diverticular disease and hemorrhoids but no active bleeding.    10/05/2013 -  Chemotherapy Started Hydrea 500 mg daily.   03/19/2014 -  Chemotherapy Hydrea increased to 500 mg bid wbc 17, hb 17, hct 54, plt 437   06/29/2014 Procedure cbc shows wbc  18.8 hb 18.7 hct 58.9 plt 456. ?compliance w hydrea as MCV not elevated. symptomatic start TP weekly x 4   07/28/2014 Procedure TP every 2 weeks if hct > 45     INTERVAL HISTORY:  Tyler Whitehead 74 y.o. male with myleofibrosis who returns follow up. He complains about dysphagia, food and liquid stuck in the mid chest, constipation, indigestion, mild nausea and vomiting in the past 2-3 weeks. He did have similar issues in th epast 5-6 years, but never this bad. he has regular bowel movement daily, no diarrhea or constipation. No fever or chills, no night sweats, no recent weight loss. He had EGD and colonoscopy 3 years ago in [place], which showed diverticolosis. He was on Jakafi before but did not tolerate well (nausea, cytopenia etc) and switched to Hydrea. He feels he is taking too many pills, and is wondering if we can stop some of them.    MEDICAL HISTORY: Past Medical History  Diagnosis Date  . Primary myelofibrosis   . Diverticular disease   . Hemorrhoid   . CAD (coronary artery disease)     a. S/p 3V CABG 2000 with LIMA-LAD, left radial-PDA, SVG-OM1. b. 2014: stent to SVG-OM1; then found to have obstruction distal to LAD anastamosis of LIMA s/p DES.   Marland Kitchen Peripheral vascular disease     a. R femoropopliteal bypass ~2012 in [place], reportedly shown to be occluded. Now followed by Dr. [name].  . DM (diabetes mellitus)   . Hyperlipidemia   . HTN (hypertension)   . RBBB      ALLERGIES:  has No Known Allergies.  MEDICATIONS: has a current medication list which includes  the following prescription(s): aspirin ec, atorvastatin, carvedilol, cholecalciferol, cilostazol, clopidogrel, fluticasone, fluvirin, hydroxyurea, insulin glargine, insulin lispro, isosorbide mononitrate, levetiracetam, linagliptin-metformin hcl, lisinopril, nitroglycerin, proair hfa, and vitamin d (ergocalciferol).  SURGICAL HISTORY:  Past Surgical History  Procedure Laterality Date  . Coronary artery bypass graft  2000     LM occluded, LIMA to LAD patent but distal native LAD disease, saphenous vein bypass graft to OM with 95% stenosis in the midportion, right coronary artery occluded, radial artery graft to right coronary artery patent but was diffusely small. Marginal graft stented. 7 2014. Of note he had multiple previous stents in the right coronary artery. The distal LAD was treated with angioplasty. I  . Cardiac catheterization  May 2014  . Percutaneous coronary stent intervention (pci-s)  April 27 2013  . Vein surgery    . Tee without cardioversion N/A 07/12/2014    Procedure: TRANSESOPHAGEAL ECHOCARDIOGRAM (TEE);  Surgeon: Fay Records, MD;  Location: [facility] ENDOSCOPY;  Service: Cardiovascular;  Laterality: N/A;  . Loop recorder implant N/A 07/12/2014    Procedure: LOOP RECORDER IMPLANT;  Surgeon: Evans Lance, MD;  Location: [facility] CATH LAB;  Service: Cardiovascular;  Laterality: N/A;  . Abdominal aortagram N/A 09/21/2014    Procedure: ABDOMINAL Maxcine Ham;  Surgeon: Elam Dutch, MD;  Location: [facility] CATH LAB;  Service: Cardiovascular;  Laterality: N/A;  . Lower extremity angiogram  09/21/2014    Procedure: LOWER EXTREMITY ANGIOGRAM;  Surgeon: Elam Dutch, MD;  Location: [facility] CATH LAB;  Service: Cardiovascular;;     Medication List       This list is accurate as of: 11/30/14  9:47 AM.  Always use your most recent med list.               aspirin EC 81 MG tablet  Take 81 mg by mouth daily.     atorvastatin 40 MG tablet  Commonly known as:  LIPITOR  Take 1.5 tablets (60 mg total) by mouth daily.     carvedilol 6.25 MG tablet  Commonly known as:  COREG  Take 1 tablet (6.25 mg total) by mouth 2 (two) times daily with a meal.     cholecalciferol 1000 UNITS tablet  Commonly known as:  VITAMIN D  Take 1,000 Units by mouth daily.     cilostazol 50 MG tablet  Commonly known as:  PLETAL  TAKE 1 TABLET BY MOUTH TWICE DAILY.     clopidogrel 75 MG tablet  Commonly known as:  PLAVIX  Take 1 tablet (75  mg total) by mouth daily with breakfast.     fluticasone 50 MCG/ACT nasal spray  Commonly known as:  FLONASE  Place 1 spray into both nostrils daily.     FLUVIRIN Susp  Generic drug:  Influenza Vac Typ A&B Surf Ant     hydroxyurea 500 MG capsule  Commonly known as:  HYDREA  Take 1 capsule (500 mg total) by mouth 2 (two) times daily.     Insulin Glargine 100 UNIT/ML Solostar Pen  Commonly known as:  LANTUS SOLOSTAR  Inject 10 Units into the skin 2 (two) times daily.     insulin lispro 100 UNIT/ML injection  Commonly known as:  HUMALOG  Inject 4 Units into the skin 3 (three) times daily before meals.     isosorbide mononitrate 30 MG 24 hr tablet  Commonly known as:  IMDUR  Take 30 mg by mouth daily.     levETIRAcetam 500 MG tablet  Commonly known as:  KEPPRA  Take 1 tablet (500 mg total) by mouth 2 (two) times daily.     Linagliptin-Metformin HCl 2.5-500 MG Tabs  Take 1 tablet by mouth 2 (two) times daily.     lisinopril 20 MG tablet  Commonly known as:  PRINIVIL,ZESTRIL  Take 1 tablet (20 mg total) by mouth daily.     nitroGLYCERIN 0.4 MG SL tablet  Commonly known as:  NITROSTAT  Place 1 tablet (0.4 mg total) under the tongue every 5 (five) minutes as needed for chest pain.     PROAIR HFA 108 (90 BASE) MCG/ACT inhaler  Generic drug:  albuterol  Inhale 1 puff into the lungs every 4 (four) hours as needed for wheezing or shortness of breath.        REVIEW OF SYSTEMS:   Constitutional: Denies fevers, chills or abnormal weight loss, (+) fatigue  Eyes: Admits to blurriness of vision and floaters Ears, nose, mouth, throat, and face: Denies mucositis or sore throat Respiratory: Denies cough, dyspnea or wheezes Cardiovascular: Denies palpitation, chest discomfort or lower extremity swelling Gastrointestinal:  Denies nausea, heartburn or change in bowel habits Skin: Denies abnormal skin rashes Lymphatics: Denies new lymphadenopathy or easy bruising Neurological:Denies  numbness, tingling or new weaknesses Behavioral/Psych: Mood is stable, no new changes  All other systems were reviewed with the patient and are negative.  PHYSICAL EXAMINATION: ECOG PERFORMANCE STATUS: 1  Blood pressure 139/67, pulse 89, temperature 98.6 F (37 C), temperature source Oral, resp. rate 18, height 5' 7" (1.702 m), weight 194 lb 8 oz (88.225 kg), SpO2 100 %.  GENERAL:alert, no distress and comfortable; elderly male who appears his stated age.  SKIN: skin color, texture, turgor are normal, no rashes or significant lesions EYES: normal, Conjunctiva are pink and non-injected, sclera clear OROPHARYNX:no exudate, no erythema and lips, buccal mucosa, and tongue normal  NECK: supple, thyroid normal size, non-tender, without nodularity LYMPH:  no palpable lymphadenopathy in the cervical, axillary or supraclavicular LUNGS: clear to auscultation with normal breathing effort, no wheezes or rhonchi HEART: regular rate & rhythm and no murmurs and no lower extremity edema ABDOMEN:abdomen soft, non-tender and normal bowel sounds Musculoskeletal:no cyanosis of digits and no clubbing  NEURO: alert & oriented x 3 with fluent speech, no focal motor/sensory deficits  Labs:  CBC Latest Ref Rng 11/30/2014 11/02/2014 10/05/2014  WBC 4.0 - 10.3 10e3/uL 19.9(H) 21.4(H) 14.4(H)  Hemoglobin 13.0 - 17.1 g/dL 14.2 14.1 13.5  Hematocrit 38.4 - 49.9 % 47.5 46.4 44.9  Platelets 140 - 400 10e3/uL 405(H) 396 300    CMP Latest Ref Rng 09/21/2014 07/28/2014 07/13/2014  Glucose 70 - 99 mg/dL 144(H) 135(H) 244(H)  BUN 6 - 23 mg/dL _0 Creatinine 0.50 - 1.35 mg/dL 1.30 0.93 0.96  Sodium 137 - 147 mEq/L 139 141 138  Potassium 3.7 - 5.3 mEq/L 4.3 5.8(H) 4.3  Chloride 96 - 112 mEq/L 102 104 101  CO2 19 - 32 mEq/L - 29 27  Calcium 8.4 - 10.5 mg/dL - 9.7 8.8  Total Protein 6.0 - 8.3 g/dL - - -  Total Bilirubin 0.3 - 1.2 mg/dL - - -  Alkaline Phos 39 - 117 U/L - - -  AST 0 - 37 U/L - - -  ALT 0 - 53 U/L  - - -     RADIOGRAPHIC STUDIES: No results found.  ASSESSMENT: Tyler Whitehead 74 y.o. male with a history of Myelofibrosis, jak 2 mutation and symptomatic polycythemia. Wbc and platelets are high as well.  PLAN:   1. Primary Myeloproliferative neoplasm (Myelofibrosis).  -We discussed that this is not curable disease, but treatable. There is a small percentage patient will develop acute leukemia or aplastic anemia later on. --His recent symptoms of indigestion, nausea, dysphagia and anorexia could be related to his primary myelofibrosis.  If his workup was negative, I would consider switching him back to check a Jakafi for symptom control. He still has some check 3 at home, we'll bring to the clinic on next visit -I discussed that he is at high risk for thrombosis secondary to his disease. -We again emphasized the importance of continued Hydrea and normalized his blood counts to prevent further stroke or thrombosis. He agrees to continue Hydrea and compliant with her treatment.  2. Indigestion, nausea, dysphagia and anorexia  -His above GI symptoms have gotten much worse daily, I would obtain a CT abdomen and pelvis for further evaluation. -He had an EGD and colonoscopy done 3 years ago in [place], he will let me know his gastroenterologist's name and address, I'll try to get a copy of those tests. If CT scan negative, I may consider sending him to GI  here.  3. CADz s/p CABG x 3, hypertension  --Continue lisinopril, coreg, aspirin   4. PvDz.  --Continue Imdur as it appearing to have improved some of his claudication symptoms.   5. Dyslipidemia.  --Continue statin therapy.   6. Stroke and seizure in 07/2014 -- follow up with his neurologist   Plan; -CT of abdomen and pelvis with contrast in the next few weeks -Return to clinic in 1 months with labs -Please call us with your gastroenterologist's name in [place], will try to get your endoscopy results from them -Bring your Jakafi to  the next appointment -Call us if you need nausea medication.    All questions were answered. The patient knows to call the clinic with any problems, questions or concerns. We can certainly see the patient much sooner if necessary.  I spent 20 minutes counseling the patient face to face. The total time spent in the appointment was 25 minutes.  Truitt Merle 11/30/2014

## 2014-11-30 NOTE — Telephone Encounter (Signed)
Pt confirmed labs/ov per 02/23 POF, gave pt AVS... KJ, gave pt barium °

## 2014-12-01 DIAGNOSIS — M25612 Stiffness of left shoulder, not elsewhere classified: Secondary | ICD-10-CM | POA: Diagnosis not present

## 2014-12-01 DIAGNOSIS — M25511 Pain in right shoulder: Secondary | ICD-10-CM | POA: Diagnosis not present

## 2014-12-01 DIAGNOSIS — M25611 Stiffness of right shoulder, not elsewhere classified: Secondary | ICD-10-CM | POA: Diagnosis not present

## 2014-12-01 DIAGNOSIS — M25512 Pain in left shoulder: Secondary | ICD-10-CM | POA: Diagnosis not present

## 2014-12-06 ENCOUNTER — Telehealth: Payer: Self-pay | Admitting: Cardiology

## 2014-12-06 ENCOUNTER — Telehealth: Payer: Self-pay | Admitting: Internal Medicine

## 2014-12-06 DIAGNOSIS — M25512 Pain in left shoulder: Secondary | ICD-10-CM | POA: Diagnosis not present

## 2014-12-06 DIAGNOSIS — M25612 Stiffness of left shoulder, not elsewhere classified: Secondary | ICD-10-CM | POA: Diagnosis not present

## 2014-12-06 DIAGNOSIS — M25611 Stiffness of right shoulder, not elsewhere classified: Secondary | ICD-10-CM | POA: Diagnosis not present

## 2014-12-06 DIAGNOSIS — M25511 Pain in right shoulder: Secondary | ICD-10-CM | POA: Diagnosis not present

## 2014-12-06 MED ORDER — CLOPIDOGREL BISULFATE 75 MG PO TABS
75.0000 mg | ORAL_TABLET | Freq: Every day | ORAL | Status: DC
Start: 2014-12-06 — End: 2014-12-13

## 2014-12-06 NOTE — Telephone Encounter (Signed)
New message      Pt want to know if we monitor his device 24 hrs a day--every day?

## 2014-12-06 NOTE — Telephone Encounter (Signed)
Spoke w/pt to let know LINQ is being monitored each night and if there are any problems someone will call pt.

## 2014-12-06 NOTE — Telephone Encounter (Signed)
Rx(s) sent to pharmacy electronically. Patient aware 

## 2014-12-06 NOTE — Telephone Encounter (Signed)
°  1. Which medications need to be refilled? clopedigrel  2. Which pharmacy is medication to be sent to?[facility] [address]  3. Do they need a 30 day or 90 day supply? Did not specify  4. Would they like a call back once the medication has been sent to the pharmacy? yes

## 2014-12-07 DIAGNOSIS — Z0000 Encounter for general adult medical examination without abnormal findings: Secondary | ICD-10-CM | POA: Diagnosis not present

## 2014-12-07 DIAGNOSIS — Z23 Encounter for immunization: Secondary | ICD-10-CM | POA: Diagnosis not present

## 2014-12-07 DIAGNOSIS — F329 Major depressive disorder, single episode, unspecified: Secondary | ICD-10-CM | POA: Diagnosis not present

## 2014-12-07 DIAGNOSIS — E1140 Type 2 diabetes mellitus with diabetic neuropathy, unspecified: Secondary | ICD-10-CM | POA: Diagnosis not present

## 2014-12-07 DIAGNOSIS — Z125 Encounter for screening for malignant neoplasm of prostate: Secondary | ICD-10-CM | POA: Diagnosis not present

## 2014-12-09 ENCOUNTER — Ambulatory Visit (INDEPENDENT_AMBULATORY_CARE_PROVIDER_SITE_OTHER): Payer: Medicare Other | Admitting: *Deleted

## 2014-12-09 DIAGNOSIS — I6330 Cerebral infarction due to thrombosis of unspecified cerebral artery: Secondary | ICD-10-CM | POA: Diagnosis not present

## 2014-12-09 LAB — MDC_IDC_ENUM_SESS_TYPE_REMOTE
Date Time Interrogation Session: 20160310115423
Zone Setting Detection Interval: 2000 ms
Zone Setting Detection Interval: 3000 ms
Zone Setting Detection Interval: 380 ms

## 2014-12-10 ENCOUNTER — Encounter: Payer: Self-pay | Admitting: Cardiology

## 2014-12-10 ENCOUNTER — Other Ambulatory Visit: Payer: Self-pay | Admitting: *Deleted

## 2014-12-10 ENCOUNTER — Ambulatory Visit (INDEPENDENT_AMBULATORY_CARE_PROVIDER_SITE_OTHER): Payer: Medicare Other | Admitting: Cardiology

## 2014-12-10 VITALS — BP 120/70 | HR 82 | Ht 67.0 in | Wt 196.0 lb

## 2014-12-10 DIAGNOSIS — I2510 Atherosclerotic heart disease of native coronary artery without angina pectoris: Secondary | ICD-10-CM | POA: Diagnosis not present

## 2014-12-10 DIAGNOSIS — I10 Essential (primary) hypertension: Secondary | ICD-10-CM

## 2014-12-10 DIAGNOSIS — R0789 Other chest pain: Secondary | ICD-10-CM | POA: Diagnosis not present

## 2014-12-10 MED ORDER — NITROGLYCERIN 0.4 MG SL SUBL: 0.4000 mg | SUBLINGUAL_TABLET | SUBLINGUAL | Status: DC | PRN

## 2014-12-10 NOTE — Progress Notes (Signed)
HPI The patient presents for evaluation of chest pain. Marland Kitchen  He has a complicated past history of CAD with treatment in [place].  He has CABG and PVD.   I have reveiwed the outside records previously.  His last cath in [place] suggested that "Medical therapy should be enhanced if the patient has ongoing exertional chest discomfort despite maximal medical management and if he has evidence of inferior ischemia on a perfusion study then repeat PCI of the native right coronary artery could be considered. However, given the patient's track record the right coronary artery is not likely to remain open even if PCI could be accomplished."  He is also managed for lower extremity disease as seen by Dr. Trula Slade.  Since I last saw him he has had one episode of pain.  This was substernal. It apparently was severe but relieved with 1 nitroglycerin. He's not had any recurrence of this. I did start him on Pletal at the last visit and he says he is having some improvement in his leg pain. He denies any recurrent chest pressure, neck or arm discomfort. He denies any palpitations, presyncope or syncope. He's doing a little more walking twice a day.  No Known Allergies  Current Outpatient Prescriptions  Medication Sig Dispense Refill  . aspirin EC 81 MG tablet Take 81 mg by mouth daily.    Marland Kitchen atorvastatin (LIPITOR) 40 MG tablet Take 1.5 tablets (60 mg total) by mouth daily. 135 tablet 3  . carvedilol (COREG) 6.25 MG tablet Take 1 tablet (6.25 mg total) by mouth 2 (two) times daily with a meal. 60 tablet 5  . cholecalciferol (VITAMIN D) 1000 UNITS tablet Take 1,000 Units by mouth daily.    . cilostazol (PLETAL) 50 MG tablet TAKE 1 TABLET BY MOUTH TWICE DAILY. 180 tablet 3  . clopidogrel (PLAVIX) 75 MG tablet Take 1 tablet (75 mg total) by mouth daily with breakfast. 30 tablet 11  . fluticasone (FLONASE) 50 MCG/ACT nasal spray Place 1 spray into both nostrils daily. 16 g 6  . FLUVIRIN SUSP   0  . hydroxyurea (HYDREA) 500 MG  capsule Take 1 capsule (500 mg total) by mouth 2 (two) times daily. 60 capsule 3  . Insulin Glargine (LANTUS SOLOSTAR) 100 UNIT/ML Solostar Pen Inject 10 Units into the skin 2 (two) times daily. 15 mL 12  . insulin lispro (HUMALOG) 100 UNIT/ML injection Inject 4 Units into the skin 3 (three) times daily before meals.     . isosorbide mononitrate (IMDUR) 30 MG 24 hr tablet Take 30 mg by mouth daily.    Marland Kitchen levETIRAcetam (KEPPRA) 500 MG tablet Take 1 tablet (500 mg total) by mouth 2 (two) times daily. 60 tablet 6  . Linagliptin-Metformin HCl 2.5-500 MG TABS Take 1 tablet by mouth 2 (two) times daily.    Marland Kitchen lisinopril (PRINIVIL,ZESTRIL) 20 MG tablet Take 1 tablet (20 mg total) by mouth daily. 30 tablet 2  . nitroGLYCERIN (NITROSTAT) 0.4 MG SL tablet Place 1 tablet (0.4 mg total) under the tongue every 5 (five) minutes as needed for chest pain. 25 tablet 12  . PROAIR HFA 108 (90 BASE) MCG/ACT inhaler Inhale 1 puff into the lungs every 4 (four) hours as needed for wheezing or shortness of breath.      No current facility-administered medications for this visit.    Past Medical History  Diagnosis Date  . Primary myelofibrosis   . Diverticular disease   . Hemorrhoid   . CAD (coronary artery disease)  a. S/p 3V CABG 2000 with LIMA-LAD, left radial-PDA, SVG-OM1. b. 2014: stent to SVG-OM1; then found to have obstruction distal to LAD anastamosis of LIMA s/p DES.   Marland Kitchen Peripheral vascular disease     a. R femoropopliteal bypass ~2012 in [place], reportedly shown to be occluded. Now followed by Dr. [name].  . DM (diabetes mellitus)   . Hyperlipidemia   . HTN (hypertension)   . RBBB     Past Surgical History  Procedure Laterality Date  . Coronary artery bypass graft  2000    LM occluded, LIMA to LAD patent but distal native LAD disease, saphenous vein bypass graft to OM with 95% stenosis in the midportion, right coronary artery occluded, radial artery graft to right coronary artery patent but was diffusely  small. Marginal graft stented. 7 2014. Of note he had multiple previous stents in the right coronary artery. The distal LAD was treated with angioplasty. I  . Cardiac catheterization  May 2014  . Percutaneous coronary stent intervention (pci-s)  April 27 2013  . Vein surgery    . Tee without cardioversion N/A 07/12/2014    Procedure: TRANSESOPHAGEAL ECHOCARDIOGRAM (TEE);  Surgeon: Fay Records, MD;  Location: [facility] ENDOSCOPY;  Service: Cardiovascular;  Laterality: N/A;  . Loop recorder implant N/A 07/12/2014    Procedure: LOOP RECORDER IMPLANT;  Surgeon: Evans Lance, MD;  Location: [facility] CATH LAB;  Service: Cardiovascular;  Laterality: N/A;  . Abdominal aortagram N/A 09/21/2014    Procedure: ABDOMINAL Maxcine Ham;  Surgeon: Elam Dutch, MD;  Location: [facility] CATH LAB;  Service: Cardiovascular;  Laterality: N/A;  . Lower extremity angiogram  09/21/2014    Procedure: LOWER EXTREMITY ANGIOGRAM;  Surgeon: Elam Dutch, MD;  Location: [facility] CATH LAB;  Service: Cardiovascular;;    ROS:  As stated in the HPI and negative for all other systems.  PHYSICAL EXAM Ht 5\' 7"  (1.702 m)  Wt 196 lb (88.905 kg)  BMI 30.69 kg/m2 GENERAL:  Well appearing NECK:  No jugular venous distention, waveform within normal limits, carotid upstroke brisk and symmetric, no bruits, no thyromegaly LUNGS:  Clear to auscultation bilaterally BACK:  No CVA tenderness CHEST:  Well healed sternotomy scar. HEART:  PMI not displaced or sustained,S1 and S2 within normal limits, no S3, no S4, no clicks, no rubs, no murmurs ABD:  Flat, positive bowel sounds normal in frequency in pitch, no bruits, no rebound, no guarding, no midline pulsatile mass, no hepatomegaly, no splenomegaly EXT:  2 plus pulses upper, absent left radial, absent femoral, DP/PT, no edema, no cyanosis no clubbing SKIN:  No rashes no nodules   EKG:  Normal sinus rhythm, rate 82, right bundle branch block, no acute ST-T wave changes. PACs 12/10/2014   ASSESSMENT AND  PLAN  CAD:  He has no active chest pain.  He will continue with risk reduction.  Given his pain I will increase his Imdur to 60 mg daily.    HTN:  The blood pressure is at target. No change in medications is indicated. We will continue with therapeutic lifestyle changes (TLC).  DM:   Per [name], MD    PVD:  Per Dr. Trula Slade.  I started the Pletal.  HYPERLIPIDEMIA:  His LDL was 40 in October.  He will continue with current meds.

## 2014-12-10 NOTE — Patient Instructions (Signed)
Your physician recommends that you schedule a follow-up appointment in: 4 months with Dr. Percival Spanish  In crease your imdur to 60 mg daily

## 2014-12-13 ENCOUNTER — Encounter: Payer: Self-pay | Admitting: Internal Medicine

## 2014-12-13 DIAGNOSIS — M25511 Pain in right shoulder: Secondary | ICD-10-CM | POA: Diagnosis not present

## 2014-12-13 DIAGNOSIS — M25612 Stiffness of left shoulder, not elsewhere classified: Secondary | ICD-10-CM | POA: Diagnosis not present

## 2014-12-13 DIAGNOSIS — M25611 Stiffness of right shoulder, not elsewhere classified: Secondary | ICD-10-CM | POA: Diagnosis not present

## 2014-12-13 DIAGNOSIS — M25512 Pain in left shoulder: Secondary | ICD-10-CM | POA: Diagnosis not present

## 2014-12-13 MED ORDER — CLOPIDOGREL BISULFATE 75 MG PO TABS: 75.0000 mg | ORAL_TABLET | Freq: Every day | ORAL | Status: DC

## 2014-12-13 NOTE — Telephone Encounter (Signed)
Pt says he checked with the pharmacist,they still do not have his generic Plavix. Please call this in today to [facility][phone] please.

## 2014-12-13 NOTE — Telephone Encounter (Signed)
Rx(s) sent to pharmacy electronically.  

## 2014-12-14 ENCOUNTER — Encounter (HOSPITAL_COMMUNITY): Payer: Self-pay

## 2014-12-14 ENCOUNTER — Ambulatory Visit (HOSPITAL_COMMUNITY)
Admission: RE | Admit: 2014-12-14 | Discharge: 2014-12-14 | Disposition: A | Discharge status: Home / Self Care | Payer: Medicare Other | Source: Ambulatory Visit | Attending: Hematology | Admitting: Hematology

## 2014-12-14 DIAGNOSIS — I700 Atherosclerosis of aorta: Secondary | ICD-10-CM | POA: Diagnosis not present

## 2014-12-14 DIAGNOSIS — D7581 Myelofibrosis: Secondary | ICD-10-CM

## 2014-12-14 DIAGNOSIS — R634 Abnormal weight loss: Secondary | ICD-10-CM | POA: Insufficient documentation

## 2014-12-14 DIAGNOSIS — D45 Polycythemia vera: Secondary | ICD-10-CM

## 2014-12-14 DIAGNOSIS — R109 Unspecified abdominal pain: Secondary | ICD-10-CM | POA: Diagnosis not present

## 2014-12-14 DIAGNOSIS — N281 Cyst of kidney, acquired: Secondary | ICD-10-CM | POA: Insufficient documentation

## 2014-12-14 LAB — POCT I-STAT CREATININE: Creatinine, Ser: 1.2 mg/dL (ref 0.50–1.35)

## 2014-12-14 NOTE — Progress Notes (Signed)
Loop recorder 

## 2014-12-15 DIAGNOSIS — M25611 Stiffness of right shoulder, not elsewhere classified: Secondary | ICD-10-CM | POA: Diagnosis not present

## 2014-12-15 DIAGNOSIS — M25512 Pain in left shoulder: Secondary | ICD-10-CM | POA: Diagnosis not present

## 2014-12-15 DIAGNOSIS — M25612 Stiffness of left shoulder, not elsewhere classified: Secondary | ICD-10-CM | POA: Diagnosis not present

## 2014-12-15 DIAGNOSIS — M25511 Pain in right shoulder: Secondary | ICD-10-CM | POA: Diagnosis not present

## 2014-12-16 ENCOUNTER — Telehealth: Payer: Self-pay | Admitting: *Deleted

## 2014-12-18 NOTE — Telephone Encounter (Signed)
OK to stop Pletal.

## 2014-12-20 DIAGNOSIS — M25611 Stiffness of right shoulder, not elsewhere classified: Secondary | ICD-10-CM | POA: Diagnosis not present

## 2014-12-20 DIAGNOSIS — M25512 Pain in left shoulder: Secondary | ICD-10-CM | POA: Diagnosis not present

## 2014-12-20 DIAGNOSIS — M25612 Stiffness of left shoulder, not elsewhere classified: Secondary | ICD-10-CM | POA: Diagnosis not present

## 2014-12-20 DIAGNOSIS — M25511 Pain in right shoulder: Secondary | ICD-10-CM | POA: Diagnosis not present

## 2014-12-20 NOTE — Telephone Encounter (Signed)
Pt. Informed it was ok to stop the Pletal

## 2014-12-21 ENCOUNTER — Other Ambulatory Visit: Payer: Self-pay

## 2014-12-21 MED ORDER — CARVEDILOL 6.25 MG PO TABS
6.2500 mg | ORAL_TABLET | Freq: Two times a day (BID) | ORAL | Status: DC
Start: 2014-12-21 — End: 2014-12-24

## 2014-12-22 ENCOUNTER — Other Ambulatory Visit (HOSPITAL_BASED_OUTPATIENT_CLINIC_OR_DEPARTMENT_OTHER): Payer: Medicare Other

## 2014-12-22 DIAGNOSIS — D45 Polycythemia vera: Secondary | ICD-10-CM | POA: Diagnosis not present

## 2014-12-22 DIAGNOSIS — M25511 Pain in right shoulder: Secondary | ICD-10-CM | POA: Diagnosis not present

## 2014-12-22 DIAGNOSIS — D7581 Myelofibrosis: Secondary | ICD-10-CM | POA: Diagnosis not present

## 2014-12-22 DIAGNOSIS — M25612 Stiffness of left shoulder, not elsewhere classified: Secondary | ICD-10-CM | POA: Diagnosis not present

## 2014-12-22 DIAGNOSIS — M25512 Pain in left shoulder: Secondary | ICD-10-CM | POA: Diagnosis not present

## 2014-12-22 DIAGNOSIS — M25611 Stiffness of right shoulder, not elsewhere classified: Secondary | ICD-10-CM | POA: Diagnosis not present

## 2014-12-22 LAB — COMPREHENSIVE METABOLIC PANEL (CC13)
ALT: 21 U/L (ref 0–55)
AST: 15 U/L (ref 5–34)
Albumin: 3.5 g/dL (ref 3.5–5.0)
Alkaline Phosphatase: 86 U/L (ref 40–150)
Anion Gap: 9 mEq/L (ref 3–11)
BUN: 12.2 mg/dL (ref 7.0–26.0)
CO2: 28 mEq/L (ref 22–29)
Calcium: 9.4 mg/dL (ref 8.4–10.4)
Chloride: 107 mEq/L (ref 98–109)
Creatinine: 1 mg/dL (ref 0.7–1.3)
EGFR: 89 mL/min/{1.73_m2} — ABNORMAL LOW (ref >90)
Glucose: 137 mg/dl (ref 70–140)
Potassium: 4.4 mEq/L (ref 3.5–5.1)
Sodium: 144 mEq/L (ref 136–145)
Total Bilirubin: 0.65 mg/dL (ref 0.20–1.20)
Total Protein: 6.2 g/dL — ABNORMAL LOW (ref 6.4–8.3)

## 2014-12-22 LAB — CBC & DIFF AND RETIC
BASO%: 2 % (ref 0.0–2.0)
Basophils Absolute: 0.3 10*3/uL — ABNORMAL HIGH (ref 0.0–0.1)
EOS%: 3.5 % (ref 0.0–7.0)
Eosinophils Absolute: 0.5 10*3/uL (ref 0.0–0.5)
HCT: 46.9 % (ref 38.4–49.9)
HGB: 13.8 g/dL (ref 13.0–17.1)
Immature Retic Fract: 18.5 % — ABNORMAL HIGH (ref 3.00–10.60)
LYMPH%: 10.2 % — ABNORMAL LOW (ref 14.0–49.0)
MCH: 24.2 pg — ABNORMAL LOW (ref 27.2–33.4)
MCHC: 29.4 g/dL — ABNORMAL LOW (ref 32.0–36.0)
MCV: 82.3 fL (ref 79.3–98.0)
MONO#: 0.9 10*3/uL (ref 0.1–0.9)
MONO%: 5.9 % (ref 0.0–14.0)
NEUT#: 11.7 10*3/uL — ABNORMAL HIGH (ref 1.5–6.5)
NEUT%: 78.4 % — ABNORMAL HIGH (ref 39.0–75.0)
Platelets: 442 10*3/uL — ABNORMAL HIGH (ref 140–400)
RBC: 5.7 10*6/uL (ref 4.20–5.82)
RDW: 17.1 % — ABNORMAL HIGH (ref 11.0–14.6)
Retic %: 1.95 % — ABNORMAL HIGH (ref 0.80–1.80)
Retic Ct Abs: 111.15 10*3/uL — ABNORMAL HIGH (ref 34.80–93.90)
WBC: 14.9 10*3/uL — ABNORMAL HIGH (ref 4.0–10.3)
lymph#: 1.5 10*3/uL (ref 0.9–3.3)
nRBC: 0 % (ref 0–0)

## 2014-12-24 ENCOUNTER — Other Ambulatory Visit: Payer: Self-pay | Admitting: *Deleted

## 2014-12-24 ENCOUNTER — Encounter: Payer: Self-pay | Admitting: Surgery

## 2014-12-24 MED ORDER — CARVEDILOL 6.25 MG PO TABS: 6.2500 mg | ORAL_TABLET | Freq: Two times a day (BID) | ORAL | Status: DC

## 2014-12-27 ENCOUNTER — Encounter: Payer: Self-pay | Admitting: Surgery

## 2014-12-27 ENCOUNTER — Other Ambulatory Visit: Payer: Self-pay

## 2014-12-27 ENCOUNTER — Ambulatory Visit (INDEPENDENT_AMBULATORY_CARE_PROVIDER_SITE_OTHER): Payer: Medicare Other | Admitting: Surgery

## 2014-12-27 VITALS — BP 141/66 | HR 76 | Ht 67.0 in | Wt 201.0 lb

## 2014-12-27 DIAGNOSIS — I70219 Atherosclerosis of native arteries of extremities with intermittent claudication, unspecified extremity: Secondary | ICD-10-CM

## 2014-12-27 DIAGNOSIS — I6330 Cerebral infarction due to thrombosis of unspecified cerebral artery: Secondary | ICD-10-CM | POA: Diagnosis not present

## 2014-12-27 NOTE — Progress Notes (Signed)
Patient name: Tyler Whitehead MRN: [id number] DOB: 02/21/1941 Sex: male     Chief Complaint  Patient presents with  . Re-evaluation    3 month f/u     HISTORY OF PRESENT ILLNESS: The patient is back today for further discussions regarding his right leg claudication. He had an above-knee femoral-popliteal bypass graft and [place] which lasted 2-3 years. He states that he has pain in his hip and right thigh with activity as well as cramping in his right calf.  His calf claudication is debilitating and less than 50 feet.  The last time I saw him I elected not to treat him surgically, as he has a history of a femoral above-knee popliteal bypass graft and Leda Gauze which lasted approximately 2.5 years.  He did not have an adequate response to cilostazol.  He does not have open wounds.  His lisinopril has recently been discontinued.  He continues to take Plavix.  He is medically managed for diabetes.  He has a history of coronary artery disease status post three-vessel CABG.  He denies chest pain.  Past Medical History  Diagnosis Date  . Primary myelofibrosis   . Diverticular disease   . Hemorrhoid   . CAD (coronary artery disease)     a. S/p 3V CABG 2000 with LIMA-LAD, left radial-PDA, SVG-OM1. b. 2014: stent to SVG-OM1; then found to have obstruction distal to LAD anastamosis of LIMA s/p DES.   Marland Kitchen Peripheral vascular disease     a. R femoropopliteal bypass ~2012 in [place], reportedly shown to be occluded. Now followed by Dr. [name].  . DM (diabetes mellitus)   . Hyperlipidemia   . HTN (hypertension)   . RBBB     Past Surgical History  Procedure Laterality Date  . Coronary artery bypass graft  2000    LM occluded, LIMA to LAD patent but distal native LAD disease, saphenous vein bypass graft to OM with 95% stenosis in the midportion, right coronary artery occluded, radial artery graft to right coronary artery patent but was diffusely small. Marginal graft stented. 7 2014. Of note he had  multiple previous stents in the right coronary artery. The distal LAD was treated with angioplasty. I  . Cardiac catheterization  May 2014  . Percutaneous coronary stent intervention (pci-s)  April 27 2013  . Vein surgery    . Tee without cardioversion N/A 07/12/2014    Procedure: TRANSESOPHAGEAL ECHOCARDIOGRAM (TEE);  Surgeon: Fay Records, MD;  Location: [facility] ENDOSCOPY;  Service: Cardiovascular;  Laterality: N/A;  . Loop recorder implant N/A 07/12/2014    Procedure: LOOP RECORDER IMPLANT;  Surgeon: Evans Lance, MD;  Location: [facility] CATH LAB;  Service: Cardiovascular;  Laterality: N/A;  . Abdominal aortagram N/A 09/21/2014    Procedure: ABDOMINAL Maxcine Ham;  Surgeon: Elam Dutch, MD;  Location: [facility] CATH LAB;  Service: Cardiovascular;  Laterality: N/A;  . Lower extremity angiogram  09/21/2014    Procedure: LOWER EXTREMITY ANGIOGRAM;  Surgeon: Elam Dutch, MD;  Location: [facility] CATH LAB;  Service: Cardiovascular;;    History   Social History  . Marital Status: Married    Spouse Name: N/A  . Number of Children: N/A  . Years of Education: N/A   Occupational History  . Limo driver    Social History Main Topics  . Smoking status: Never Smoker   . Smokeless tobacco: Never Used  . Alcohol Use: No  . Drug Use: No  . Sexual Activity: Not on file   Other  Topics Concern  . Not on file   Social History Narrative   Lives with wife.     Family History  Problem Relation Age of Onset  . Hyperlipidemia Maternal Grandmother   . Asthma Father   . Heart disease Father   . CAD      Multiple siblings    Allergies as of 12/27/2014  . (No Known Allergies)    Current Outpatient Prescriptions on File Prior to Visit  Medication Sig Dispense Refill  . aspirin EC 81 MG tablet Take 81 mg by mouth daily.    Marland Kitchen atorvastatin (LIPITOR) 40 MG tablet Take 1.5 tablets (60 mg total) by mouth daily. 135 tablet 3  . carvedilol (COREG) 6.25 MG tablet Take 1 tablet (6.25 mg total) by mouth 2 (two) times  daily with a meal. 180 tablet 3  . cholecalciferol (VITAMIN D) 1000 UNITS tablet Take 1,000 Units by mouth daily.    . cilostazol (PLETAL) 50 MG tablet TAKE 1 TABLET BY MOUTH TWICE DAILY. 180 tablet 3  . clopidogrel (PLAVIX) 75 MG tablet Take 1 tablet (75 mg total) by mouth daily with breakfast. 30 tablet 11  . fluticasone (FLONASE) 50 MCG/ACT nasal spray Place 1 spray into both nostrils daily. 16 g 6  . FLUVIRIN SUSP   0  . hydroxyurea (HYDREA) 500 MG capsule Take 1 capsule (500 mg total) by mouth 2 (two) times daily. 60 capsule 3  . Insulin Glargine (LANTUS SOLOSTAR) 100 UNIT/ML Solostar Pen Inject 10 Units into the skin 2 (two) times daily. 15 mL 12  . insulin lispro (HUMALOG) 100 UNIT/ML injection Inject 4 Units into the skin 3 (three) times daily before meals.     . isosorbide mononitrate (IMDUR) 60 MG 24 hr tablet Take 60 mg by mouth daily.    Marland Kitchen levETIRAcetam (KEPPRA) 500 MG tablet Take 1 tablet (500 mg total) by mouth 2 (two) times daily. 60 tablet 6  . Linagliptin-Metformin HCl 2.5-500 MG TABS Take 1 tablet by mouth 2 (two) times daily.    Marland Kitchen lisinopril (PRINIVIL,ZESTRIL) 20 MG tablet Take 1 tablet (20 mg total) by mouth daily. 30 tablet 2  . nitroGLYCERIN (NITROSTAT) 0.4 MG SL tablet Place 1 tablet (0.4 mg total) under the tongue every 5 (five) minutes as needed for chest pain. 25 tablet 12  . PROAIR HFA 108 (90 BASE) MCG/ACT inhaler Inhale 1 puff into the lungs every 4 (four) hours as needed for wheezing or shortness of breath.      No current facility-administered medications on file prior to visit.     REVIEW OF SYSTEMS: Cardiovascular: No chest pain, chest pressure, palpitations, orthopnea, or dyspnea on exertion.  See history of present illness Pulmonary: No productive cough, asthma or wheezing. Neurologic: No weakness, paresthesias, aphasia, or amaurosis. No dizziness. Hematologic: No bleeding problems or clotting disorders. Musculoskeletal: No joint pain or joint  swelling. Gastrointestinal: No blood in stool or hematemesis Genitourinary: No dysuria or hematuria. Psychiatric:: No history of major depression. Integumentary: No rashes or ulcers. Constitutional: No fever or chills.  PHYSICAL EXAMINATION:   Vital signs are  Filed Vitals:   12/27/14 1026 12/27/14 1113  BP: 143/81 141/66  Pulse: 77 76  Height: 5\' 7"  (1.702 m)   Weight: 201 lb (91.173 kg)   SpO2: 98%    Body mass index is 31.47 kg/(m^2). General: The patient appears their stated age. HEENT:  No gross abnormalities Pulmonary:  Non labored breathing Abdomen: Soft and non-tender Musculoskeletal: There are no major deformities.  Neurologic: No focal weakness or paresthesias are detected, Skin: There are no ulcer or rashes noted. Psychiatric: The patient has normal affect. Cardiovascular: There is a regular rate and rhythm without significant murmur appreciated.   Diagnostic Studies I have reviewed his and gram which shows reconstitution of the above-knee popliteal artery at the level of the patella. He does not have an adequate vein.  Assessment: Right leg claudication Plan: The patient cannot tolerate his level of discomfort.  I am reluctant to proceed with a below-knee popliteal bypass graft with Gore-Tex given his history of early occlusion from an above-knee bypass.  In addition he has relatively poor runoff.  He states that he cannot tolerate his level of discomfort and has to have something done.  I propose proceeding with an attempt at recanalization and stenting of his occluded right superficial femoral and above-knee popliteal artery.  This is been scheduled for Tuesday, April 12  The patient will follow-up with his primary care doctor regarding his hypertension which was repeated.  His ACE inhibitor was recently discontinued.  He will also follow-up regarding his elevated BMI  V. Leia Alf, M.D. [facility] Office:  [phone] Pager:  [phone]

## 2014-12-28 ENCOUNTER — Telehealth: Payer: Self-pay | Admitting: *Deleted

## 2014-12-28 ENCOUNTER — Ambulatory Visit (HOSPITAL_BASED_OUTPATIENT_CLINIC_OR_DEPARTMENT_OTHER): Payer: Medicare Other | Admitting: Hematology

## 2014-12-28 ENCOUNTER — Ambulatory Visit (HOSPITAL_BASED_OUTPATIENT_CLINIC_OR_DEPARTMENT_OTHER): Payer: Medicare Other

## 2014-12-28 VITALS — BP 136/64 | HR 75 | Temp 98.4°F | Resp 18 | Ht 67.0 in | Wt 197.5 lb

## 2014-12-28 DIAGNOSIS — D45 Polycythemia vera: Secondary | ICD-10-CM

## 2014-12-28 DIAGNOSIS — D7581 Myelofibrosis: Secondary | ICD-10-CM | POA: Diagnosis not present

## 2014-12-28 DIAGNOSIS — D751 Secondary polycythemia: Secondary | ICD-10-CM

## 2014-12-28 NOTE — Patient Instructions (Signed)

## 2014-12-28 NOTE — Progress Notes (Signed)
Pt has not eaten at all today.  Pt ate [place] sandwich, bag of potato chips and cup of coffee. 1157-Phlebotomy complete to right ac, 550 grams removed without difficulty.  Completed at 1205.  Pressure dressing applied.    1235-VS stable.  Pt without complaints at this time.  Pressure dressing remains intact to right ac.  Phlebotomy discharge instructions reviewed with pt.  Teach back done.  Pt has no questions at this time.

## 2014-12-28 NOTE — Telephone Encounter (Signed)
Cover my meds priro authorization started for patient on clopidogrel 75 mg once daily, currently pending.

## 2014-12-28 NOTE — Telephone Encounter (Signed)
Patient is currently prescribed atorvastatin 60 mg once daily. According to [employer] atorvastatin 20 or 40 or 80 mg is covered. There is no alternative for atorvastatin 60 mg. Will make dr hochrein aware.

## 2014-12-28 NOTE — Progress Notes (Signed)
[place] OFFICE PROGRESS NOTE  [name], MD [phone], [address] [place] [address]  DIAGNOSIS: Myelofibrosis/Myeloproliferative disorder (Overlap). JAK2+.  CHIEF COMPLAINS: follow up   CURRENT TREATMENT:    1.Hydrea 500 mg twice daily. 2. Aspirin daily.    Myelofibrosis   12/26/2012 Bone Marrow Biopsy Showed hypercellularity with reticulum fibrosis, JAK-2 mutation positive, BCR/ABL negative, 30 XY in 20 metaphase, and Intermediate-1 ,DIPSS of 2. (WBC < 25; Hbg > 10;    01/02/2013 Imaging US abdomen complete revealed Mild splenogmealy (13.3 cm) and a 2.2 x 1.7 x 1.8 cm left renal cyst   01/15/2013 Pathology Results Integrated hematopathology report.  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    03/17/2013 Imaging CT Chest: Crowded peribronchial markings noted, associated with linear densities involving the left base, probably representing areas of platelike atelectasis/scarring.  No mass or infiltrate seen.    03/19/2013 - 07/20/2013 Chemotherapy Started Jakafi 20 mg bid. Dose reduced due to anemia.  Stopped due to increasing fatigue while on jakafi.    04/28/2013 - 05/01/2013 Hospital Admission Admitted to [facility] by Dr. Francia Greaves, his cardiologist, due to the fact that he had midsternal chest pain.  He had a coronary stent placed this admission.    05/28/2013 - 05/29/2013 Hospital Admission Admitted to [facility] and received 3 units of blood (Dr. Lendon Colonel note).    06/10/2013 Surgery Cololonoscopy due to GIB.  C/w diverticular disease and hemorrhoids but no active bleeding.    10/05/2013 -  Chemotherapy Started Hydrea 500 mg daily.   03/19/2014 -  Chemotherapy Hydrea increased to 500 mg bid wbc 17, hb 17, hct 54, plt 437   06/29/2014 Procedure cbc shows wbc 18.8 hb 18.7 hct 58.9 plt  456. ?compliance w hydrea as MCV not elevated. symptomatic start TP weekly x 4   07/28/2014 Procedure TP every 2 weeks if hct > 45     INTERVAL HISTORY:  Tyler Whitehead 74 y.o. male with myleofibrosis who returns follow up. He states his dysphagia has improved lately, he had barrium swollowibg and was negative, he has leg pain and claudication, he is going to have stent placement on April 12. He otherwise feels well, no other new complaints.   MEDICAL HISTORY: Past Medical History  Diagnosis Date  . Primary myelofibrosis   . Diverticular disease   . Hemorrhoid   . CAD (coronary artery disease)     a. S/p 3V CABG 2000 with LIMA-LAD, left radial-PDA, SVG-OM1. b. 2014: stent to SVG-OM1; then found to have obstruction distal to LAD anastamosis of LIMA s/p DES.   Marland Kitchen Peripheral vascular disease     a. R femoropopliteal bypass ~2012 in [place], reportedly shown to be occluded. Now followed by Dr. [name].  . DM (diabetes mellitus)   . Hyperlipidemia   . HTN (hypertension)   . RBBB      ALLERGIES:  has No Known Allergies.  MEDICATIONS: has a current medication list which includes the following prescription(s): aspirin ec, carvedilol, cholecalciferol, cilostazol, clopidogrel, fluticasone, hydroxyurea, insulin glargine, insulin lispro, isosorbide mononitrate, levetiracetam, linagliptin-metformin hcl, proair hfa, atorvastatin, atorvastatin, and nitroglycerin.  SURGICAL HISTORY:  Past Surgical History  Procedure Laterality Date  . Coronary artery bypass graft  2000    LM occluded, LIMA to LAD patent but distal native LAD disease, saphenous vein bypass graft to OM with 95% stenosis in the midportion, right coronary  artery occluded, radial artery graft to right coronary artery patent but was diffusely small. Marginal graft stented. 7 2014. Of note he had multiple previous stents in the right coronary artery. The distal LAD was treated with angioplasty. I  . Cardiac catheterization  May 2014  .  Percutaneous coronary stent intervention (pci-s)  April 27 2013  . Vein surgery    . Tee without cardioversion N/A 07/12/2014    Procedure: TRANSESOPHAGEAL ECHOCARDIOGRAM (TEE);  Surgeon: Fay Records, MD;  Location: [facility] ENDOSCOPY;  Service: Cardiovascular;  Laterality: N/A;  . Loop recorder implant N/A 07/12/2014    Procedure: LOOP RECORDER IMPLANT;  Surgeon: Evans Lance, MD;  Location: [facility] CATH LAB;  Service: Cardiovascular;  Laterality: N/A;  . Abdominal aortagram N/A 09/21/2014    Procedure: ABDOMINAL Maxcine Ham;  Surgeon: Elam Dutch, MD;  Location: [facility] CATH LAB;  Service: Cardiovascular;  Laterality: N/A;  . Lower extremity angiogram  09/21/2014    Procedure: LOWER EXTREMITY ANGIOGRAM;  Surgeon: Elam Dutch, MD;  Location: [facility] CATH LAB;  Service: Cardiovascular;;     Medication List       This list is accurate as of: 12/28/14 11:59 PM.  Always use your most recent med list.               aspirin EC 81 MG tablet  Take 81 mg by mouth daily.     atorvastatin 40 MG tablet  Commonly known as:  LIPITOR  Take 1 tablet (40 mg total) by mouth daily.     atorvastatin 20 MG tablet  Commonly known as:  LIPITOR  Take 1 tablet (20 mg total) by mouth daily.     carvedilol 6.25 MG tablet  Commonly known as:  COREG  Take 1 tablet (6.25 mg total) by mouth 2 (two) times daily with a meal.     cholecalciferol 1000 UNITS tablet  Commonly known as:  VITAMIN D  Take 1,000 Units by mouth daily.     cilostazol 50 MG tablet  Commonly known as:  PLETAL  TAKE 1 TABLET BY MOUTH TWICE DAILY.     clopidogrel 75 MG tablet  Commonly known as:  PLAVIX  Take 1 tablet (75 mg total) by mouth daily with breakfast.     fluticasone 50 MCG/ACT nasal spray  Commonly known as:  FLONASE  Place 1 spray into both nostrils daily.     hydroxyurea 500 MG capsule  Commonly known as:  HYDREA  Take 1 capsule (500 mg total) by mouth 2 (two) times daily.     Insulin Glargine 100 UNIT/ML Solostar Pen   Commonly known as:  LANTUS SOLOSTAR  Inject 10 Units into the skin 2 (two) times daily.     insulin lispro 100 UNIT/ML injection  Commonly known as:  HUMALOG  Inject 4 Units into the skin 3 (three) times daily before meals.     isosorbide mononitrate 60 MG 24 hr tablet  Commonly known as:  IMDUR  Take 60 mg by mouth daily.     levETIRAcetam 500 MG tablet  Commonly known as:  KEPPRA  Take 1 tablet (500 mg total) by mouth 2 (two) times daily.     Linagliptin-Metformin HCl 2.5-500 MG Tabs  Take 1 tablet by mouth 2 (two) times daily.     nitroGLYCERIN 0.4 MG SL tablet  Commonly known as:  NITROSTAT  Place 1 tablet (0.4 mg total) under the tongue every 5 (five) minutes as needed for chest pain.  PROAIR HFA 108 (90 BASE) MCG/ACT inhaler  Generic drug:  albuterol  Inhale 1 puff into the lungs every 4 (four) hours as needed for wheezing or shortness of breath.        REVIEW OF SYSTEMS:   Constitutional: Denies fevers, chills or abnormal weight loss, (+) fatigue  Eyes: Admits to blurriness of vision and floaters Ears, nose, mouth, throat, and face: Denies mucositis or sore throat Respiratory: Denies cough, dyspnea or wheezes Cardiovascular: Denies palpitation, chest discomfort or lower extremity swelling Gastrointestinal:  Denies nausea, heartburn or change in bowel habits Skin: Denies abnormal skin rashes Lymphatics: Denies new lymphadenopathy or easy bruising Neurological:Denies numbness, tingling or new weaknesses Behavioral/Psych: Mood is stable, no new changes  All other systems were reviewed with the patient and are negative.  PHYSICAL EXAMINATION: ECOG PERFORMANCE STATUS: 1  Blood pressure 136/64, pulse 75, temperature 98.4 F (36.9 C), temperature source Oral, resp. rate 18, height _0  (1.702 m), weight 197 lb 8 oz (89.585 kg), SpO2 99 %.  GENERAL:alert, no distress and comfortable; elderly male who appears his stated age.  SKIN: skin color, texture, turgor are  normal, no rashes or significant lesions EYES: normal, Conjunctiva are pink and non-injected, sclera clear OROPHARYNX:no exudate, no erythema and lips, buccal mucosa, and tongue normal  NECK: supple, thyroid normal size, non-tender, without nodularity LYMPH:  no palpable lymphadenopathy in the cervical, axillary or supraclavicular LUNGS: clear to auscultation with normal breathing effort, no wheezes or rhonchi HEART: regular rate & rhythm and no murmurs and no lower extremity edema ABDOMEN:abdomen soft, non-tender and normal bowel sounds Musculoskeletal:no cyanosis of digits and no clubbing  NEURO: alert & oriented x 3 with fluent speech, no focal motor/sensory deficits  Labs:  CBC Latest Ref Rng 12/22/2014 11/30/2014 11/02/2014  WBC 4.0 - 10.3 10e3/uL 14.9(H) 19.9(H) 21.4(H)  Hemoglobin 13.0 - 17.1 g/dL 13.8 14.2 14.1  Hematocrit 38.4 - 49.9 % 46.9 47.5 46.4  Platelets 140 - 400 10e3/uL 442(H) 405(H) 396    CMP Latest Ref Rng 12/22/2014 12/14/2014 09/21/2014  Glucose 70 - 140 mg/dl 137 - 144(H)  BUN 7.0 - 26.0 mg/dL 12.2 - 15  Creatinine 0.7 - 1.3 mg/dL 1.0 1.20 1.30  Sodium 136 - 145 mEq/L 144 - 139  Potassium 3.5 - 5.1 mEq/L 4.4 - 4.3  Chloride 96 - 112 mEq/L - - 102  CO2 22 - 29 mEq/L 28 - -  Calcium 8.4 - 10.4 mg/dL 9.4 - -  Total Protein 6.4 - 8.3 g/dL 6.2(L) - -  Total Bilirubin 0.20 - 1.20 mg/dL 0.65 - -  Alkaline Phos 40 - 150 U/L 86 - -  AST 5 - 34 U/L 15 - -  ALT 0 - 55 U/L 21 - -     RADIOGRAPHIC STUDIES: CT abdomen and pelvis with contrast on 12/14/2014  IMPRESSION: 1. No acute findings within the abdomen or pelvis. 2. Atherosclerotic disease. 3. Kidney cysts.  ASSESSMENT: Tyler Whitehead 74 y.o. male with a history of Myelofibrosis, jak 2 mutation and symptomatic polycythemia. Wbc and platelets are high as well.   PLAN:   1. Primary Myeloproliferative neoplasm (Myelofibrosis).  -We discussed that this is not curable disease, but treatable. There is a small  percentage patient will develop acute leukemia or aplastic anemia later on. -I reviewed his CT of abdomen, which showedspleen medically or adenopathy. -I discussed that he is at high risk for thrombosis secondary to his disease. -We again emphasized the importance of continued Hydrea and normalized his  blood counts to prevent further stroke or thrombosis. He agrees to continue Hydrea and compliant with her treatment. -His hematocrit is 46.9 today, He agrees to have phlebotomy today -He is clinically stable, dysphagia and fatigue has improved.   2. Indigestion, nausea, dysphagia and anorexia  -Improved. He recently had a barium swallow evaluation -Follow up with primary care physician  3. CADz s/p CABG x 3, hypertension  --Continue lisinopril, coreg, aspirin   4. PvDz.  --Continue Imdur as it appearing to have improved some of his claudication symptoms.   5. Dyslipidemia.  --Continue statin therapy.   6. Stroke and seizure in 07/2014 -- follow up with his neurologist   Plan; -Phlebotomy today continue Hydrea  - continue Hydrea at same dose  -Return to clinic in 1 months.    All questions were answered. The patient knows to call the clinic with any problems, questions or concerns. We can certainly see the patient much sooner if necessary.  I spent 20 minutes counseling the patient face to face. The total time spent in the appointment was 25 minutes.  Truitt Merle 12/28/2014

## 2014-12-30 ENCOUNTER — Telehealth: Payer: Self-pay | Admitting: Cardiology

## 2014-12-30 MED ORDER — ATORVASTATIN CALCIUM 20 MG PO TABS: 20.0000 mg | ORAL_TABLET | Freq: Every day | ORAL | Status: DC

## 2014-12-30 MED ORDER — ATORVASTATIN CALCIUM 40 MG PO TABS: 40.0000 mg | ORAL_TABLET | Freq: Every day | ORAL | Status: DC

## 2014-12-30 NOTE — Telephone Encounter (Signed)
Fax returned from [place], plavix does not require prior authorization. Fax returned to [facility].

## 2014-12-30 NOTE — Telephone Encounter (Signed)
Will they not cover him to take one 20 mg tablet and one 40 mg tablet?  If not will they cover 1 and 1/2 of the 5s?

## 2014-12-30 NOTE — Telephone Encounter (Signed)
New script sent to the pharmacy for lipitor 40 and 20 mg tablets to see if will be covered.

## 2014-12-30 NOTE — Telephone Encounter (Signed)
Threasa Beards called in stating that the Dr. Percival Spanish sent in a prescription for both Lipitor 20mg  and 40mg . She would like to clarify which one needs to be filled. Please call back  Thanks

## 2014-12-30 NOTE — Telephone Encounter (Signed)
Spoke with melanie, aware the patients dosage of lipitor needs to be 60 mg daily. His insurance will not approve 40 1 and 1/2 so we are trying two pills. She reports the insurance approved.

## 2014-12-31 ENCOUNTER — Encounter: Payer: Self-pay | Admitting: Hematology

## 2014-12-31 ENCOUNTER — Ambulatory Visit: Payer: Medicare Other | Admitting: Neurology

## 2015-01-03 ENCOUNTER — Encounter: Payer: Self-pay | Admitting: Neurology

## 2015-01-03 ENCOUNTER — Ambulatory Visit (INDEPENDENT_AMBULATORY_CARE_PROVIDER_SITE_OTHER): Payer: Medicare Other | Admitting: Neurology

## 2015-01-03 VITALS — BP 110/84 | HR 84 | Resp 16 | Ht 67.0 in | Wt 197.0 lb

## 2015-01-03 DIAGNOSIS — I639 Cerebral infarction, unspecified: Secondary | ICD-10-CM

## 2015-01-03 DIAGNOSIS — G40209 Localization-related (focal) (partial) symptomatic epilepsy and epileptic syndromes with complex partial seizures, not intractable, without status epilepticus: Secondary | ICD-10-CM | POA: Diagnosis not present

## 2015-01-03 DIAGNOSIS — I6330 Cerebral infarction due to thrombosis of unspecified cerebral artery: Secondary | ICD-10-CM

## 2015-01-03 MED ORDER — LEVETIRACETAM 500 MG PO TABS: 500.0000 mg | ORAL_TABLET | Freq: Two times a day (BID) | ORAL | Status: DC

## 2015-01-03 NOTE — Patient Instructions (Signed)
1. Continue Keppra 500mg  twice a day 2. Follow-up in 6 months 3. As per [place] driving laws, no driving until 6 months seizure-free  Seizure Precautions: 1. If medication has been prescribed for you to prevent seizures, take it exactly as directed.  Do not stop taking the medicine without talking to your doctor first, even if you have not had a seizure in a long time.   2. Avoid activities in which a seizure would cause danger to yourself or to others.  Don't operate dangerous machinery, swim alone, or climb in high or dangerous places, such as on ladders, roofs, or girders.  Do not drive unless your doctor says you may.  3. If you have any warning that you may have a seizure, lay down in a safe place where you can't hurt yourself.    4.  No driving for 6 months from last seizure, as per [facility].   Please refer to the following link on the [place] website for more information: [email or web address]/[email or web address]/[email or web address]   5.  Maintain good sleep hygiene.  6.  Contact your doctor if you have any problems that may be related to the medicine you are taking.  7.  Call 911 and bring the patient back to the ED if:        A.  The seizure lasts longer than 5 minutes.       B.  The patient doesn't awaken shortly after the seizure  C.  The patient has new problems such as difficulty seeing, speaking or moving  D.  The patient was injured during the seizure  E.  The patient has a temperature over 102 F (39C)  F.  The patient vomited and now is having trouble breathing

## 2015-01-03 NOTE — Progress Notes (Signed)
NEUROLOGY FOLLOW UP OFFICE NOTE  Tyler Whitehead [id number]  HISTORY OF PRESENT ILLNESS: I had the pleasure of seeing Tyler Whitehead in follow-up in the neurology clinic on 01/03/2015.  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 He denies any further seizures or seizure-like symptoms since October 2015 on Keppra 552m BID. No side effects on Keppra. He denies any right visual field changes. No further headaches. He denies any diplopia, dysarthria, dysphagia, focal numbness/tingling/weakness.  HPI: This is a pleasant 74yo RH man with a history of hypertension, hyperlipidemia, diabetes, CAD s/p CABG and PTCA, peripheral vascular disease s/p fempop bypass, myelofibrosis/myeloproliferative disorder with polycythemia, with new onset seizure last 07/09/14. He reports that he started having headaches around a month prior, with dull pain over the left frontal region. [facility] after his Cardiology and Oncology visits, head CT normal, headache improved with narcotics. He had reported seeing spots in his vision and was scheduled to see an ophthalmologist. He had a normal exam and returned to the ER on 09/28 again for constant left frontal headaches that progressively worsened. He had some nausea and vomiting a few days prior. He was evaluated by Neurology with normal exam. ESR and CRP negative. Headache resolved with dilaudid, Ativan, Reglan, and morphine, and he was discharged home on steroid taper to prevent rebound headaches. He again returned to the ER on 09/30 with the same headache, exam non-focal. He had intermittent blurred vision but appeared chronic. He was given a cocktail again with resolution of headaches. On 07/09/14, his wife noticed that throughout the day, he kept looking to the right side. He reported inability to see on the right side. At dinner time, he recalls sitting at the table eating, he recalls seeing lights in his right  visual field and little black spots like ants, then woke up in the hospital. His wife reported that he got up, then his mouth drooped, his right hand started shaking, then he started having a GTC and fell to the floor. The seizure lasted several minutes. He was initially confused on arrival to the ER then subsequently returned to normal, with initial neurological exam showing dense right homonymous hemianopia. He denied any focal numbness, tingling, or weakness.   I personally reviewed MRI brain which showed an acute left occipital infarct. MRA showed intracranial atherosclerosis without flow-reducing lesion, unremarkable extracranial MRA. He had been taking aspirin and Plavix since stent placement a year ago, this was not changed during his hospitalization. He had a TTE and TEE which showed mild fixed plaque in the thoracic aorta. He had an implantable loop recorder placed. He does report occasional palpitations, no chest pains. Routine EEG normal. He reports that for a week after hospital discharge, he continued to see flashing lights and black spots in his right visual field, this gradually improved, and he currently denies any abnormality in his vision. He denies any further headaches.   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 Otherwise he had a normal birth and early development. There is no history of febrile convulsions, CNS infections such as meningitis/encephalitis, significant traumatic brain injury, neurosurgical procedures, or family history of seizures  PAST MEDICAL HISTORY: Past Medical History  Diagnosis Date  . Primary myelofibrosis   . Diverticular disease   . Hemorrhoid   . CAD (coronary artery disease)     a. S/p 3V CABG 2000 with LIMA-LAD, left radial-PDA, SVG-OM1. b. 2014: stent to SVG-OM1;  then found to have obstruction distal to LAD anastamosis of LIMA s/p DES.   Marland Kitchen Peripheral vascular disease     a. R femoropopliteal bypass ~2012 in [place], reportedly shown to be  occluded. Now followed by Dr. [name].  . DM (diabetes mellitus)   . Hyperlipidemia   . HTN (hypertension)   . RBBB     MEDICATIONS: Current Outpatient Prescriptions on File Prior to Visit  Medication Sig Dispense Refill  . aspirin EC 81 MG tablet Take 81 mg by mouth daily.    Marland Kitchen atorvastatin (LIPITOR) 40 MG tablet Take 1 tablet (40 mg total) by mouth daily. (Patient taking differently: Take 40 mg by mouth. Take 1 & 1/2 tablet daily) 90 tablet 3  . carvedilol (COREG) 6.25 MG tablet Take 1 tablet (6.25 mg total) by mouth 2 (two) times daily with a meal. 180 tablet 3  . cholecalciferol (VITAMIN D) 1000 UNITS tablet Take 1,000 Units by mouth daily.    . cilostazol (PLETAL) 50 MG tablet TAKE 1 TABLET BY MOUTH TWICE DAILY. 180 tablet 3  . clopidogrel (PLAVIX) 75 MG tablet Take 1 tablet (75 mg total) by mouth daily with breakfast. 30 tablet 11  . fluticasone (FLONASE) 50 MCG/ACT nasal spray Place 1 spray into both nostrils daily. 16 g 6  . hydroxyurea (HYDREA) 500 MG capsule Take 1 capsule (500 mg total) by mouth 2 (two) times daily. (Patient taking differently: 500 mg. Take 2 capsulses every morning & 1 capsules every evening) 60 capsule 3  . Insulin Glargine (LANTUS SOLOSTAR) 100 UNIT/ML Solostar Pen Inject 10 Units into the skin 2 (two) times daily. 15 mL 12  . insulin lispro (HUMALOG) 100 UNIT/ML injection Inject 4 Units into the skin 3 (three) times daily before meals.     . isosorbide mononitrate (IMDUR) 60 MG 24 hr tablet Take 60 mg by mouth daily.    Marland Kitchen levETIRAcetam (KEPPRA) 500 MG tablet Take 1 tablet (500 mg total) by mouth 2 (two) times daily. 60 tablet 6  . Linagliptin-Metformin HCl 2.5-500 MG TABS Take 1 tablet by mouth 2 (two) times daily.    Marland Kitchen PROAIR HFA 108 (90 BASE) MCG/ACT inhaler Inhale 1 puff into the lungs every 4 (four) hours as needed for wheezing or shortness of breath.     . nitroGLYCERIN (NITROSTAT) 0.4 MG SL tablet Place 1 tablet (0.4 mg total) under the tongue every 5  (five) minutes as needed for chest pain. (Patient not taking: Reported on 12/28/2014) 25 tablet 12   No current facility-administered medications on file prior to visit.    ALLERGIES: No Known Allergies  FAMILY HISTORY: Family History  Problem Relation Age of Onset  . Hyperlipidemia Maternal Grandmother   . Asthma Father   . Heart disease Father   . CAD      Multiple siblings    SOCIAL HISTORY: History   Social History  . Marital Status: Married    Spouse Name: N/A  . Number of Children: N/A  . Years of Education: N/A   Occupational History  . Limo driver    Social History Main Topics  . Smoking status: Never Smoker   . Smokeless tobacco: Never Used  . Alcohol Use: No  . Drug Use: No  . Sexual Activity: Not on file   Other Topics Concern  . Not on file   Social History Narrative   Lives with wife.     REVIEW OF SYSTEMS: Constitutional: No fevers, chills, or sweats, no generalized fatigue, change  in appetite Eyes: No visual changes, double vision, eye pain Ear, nose and throat: No hearing loss, ear pain, nasal congestion, sore throat Cardiovascular: No chest pain, palpitations Respiratory:  No shortness of breath at rest or with exertion, wheezes GastrointestinaI: No nausea, vomiting, diarrhea, abdominal pain, fecal incontinence Genitourinary:  No dysuria, urinary retention or frequency Musculoskeletal:  No neck pain, back pain Integumentary: No rash, pruritus, skin lesions Neurological: as above Psychiatric: No depression, insomnia, anxiety Endocrine: No palpitations, fatigue, diaphoresis, mood swings, change in appetite, change in weight, increased thirst Hematologic/Lymphatic:  No anemia, purpura, petechiae. Allergic/Immunologic: no itchy/runny eyes, nasal congestion, recent allergic reactions, rashes  PHYSICAL EXAM: Filed Vitals:   01/03/15 1446  BP: 110/84  Pulse: 84  Resp: 16   General: No acute distress Head:  Normocephalic/atraumatic Neck:  supple, no paraspinal tenderness, full range of motion Heart:  Regular rate and rhythm Lungs:  Clear to auscultation bilaterally Back: No paraspinal tenderness Skin/Extremities: No rash, no edema Neurological Exam: alert and oriented to person, place, and time. No aphasia or dysarthria. Fund of knowledge is appropriate.  Recent and remote memory are intact.  Attention and concentration are normal.    Able to name objects and repeat phrases. Cranial nerves: Pupils equal, round, reactive to light.  Fundoscopic exam unremarkable, no papilledema. Extraocular movements intact with no nystagmus. Visual fields full. Facial sensation intact. No facial asymmetry. Tongue, uvula, palate midline.  Motor: Bulk and tone normal, muscle strength 5/5 throughout with no pronator drift.  Sensation to light touch intact.  No extinction to double simultaneous stimulation.  Deep tendon reflexes 2+ throughout, toes downgoing.  Finger to nose testing intact.  Gait narrow-based and steady, able to tandem walk adequately.  Romberg negative.  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 Neurological exam today normal. Stroke workup unremarkable. He continues on aspirin and Plavix. Continue control of other vascular risk factors. Continue Keppra 511m BID for seizure prophylaxis for now. We again discussed that if visual symptoms recur, we will plan to increase dose to 7526mBID. He is aware of [place] driving laws to stop driving after a seizure, until 6 months seizure-free. He knows to go to the ER immediately for any change in symptoms. He will follow-up in 6 months and knows to call our office for any problems in the interim.  Thank you for allowing  me to participate in his care.  Please do not hesitate to call for any questions or concerns.  The duration of this appointment visit was 15 minutes of face-to-face time with the patient.  Greater than 50% of this time was spent in counseling, explanation of diagnosis, planning of further management, and coordination of care.   KaEllouise NewerM.D.   CC: Dr. ThSheryn Bison

## 2015-01-07 ENCOUNTER — Ambulatory Visit (INDEPENDENT_AMBULATORY_CARE_PROVIDER_SITE_OTHER): Payer: Medicare Other | Admitting: *Deleted

## 2015-01-07 ENCOUNTER — Encounter: Payer: Self-pay | Admitting: Neurology

## 2015-01-07 DIAGNOSIS — I6330 Cerebral infarction due to thrombosis of unspecified cerebral artery: Secondary | ICD-10-CM | POA: Diagnosis not present

## 2015-01-08 LAB — MDC_IDC_ENUM_SESS_TYPE_REMOTE
Date Time Interrogation Session: 20160401021127
Zone Setting Detection Interval: 2000 ms
Zone Setting Detection Interval: 3000 ms
Zone Setting Detection Interval: 380 ms

## 2015-01-11 NOTE — Progress Notes (Signed)
Loop recorder 

## 2015-01-12 DIAGNOSIS — F331 Major depressive disorder, recurrent, moderate: Secondary | ICD-10-CM | POA: Diagnosis not present

## 2015-01-14 DIAGNOSIS — F331 Major depressive disorder, recurrent, moderate: Secondary | ICD-10-CM | POA: Diagnosis not present

## 2015-01-17 MED ORDER — SODIUM CHLORIDE 0.9 % IV SOLN
INTRAVENOUS | Status: DC
  Administered 2015-01-18: 07:00:00 via INTRAVENOUS

## 2015-01-18 ENCOUNTER — Encounter (HOSPITAL_COMMUNITY)
Admission: RE | Disposition: A | Discharge status: Home / Self Care | Payer: Self-pay | Source: Ambulatory Visit | Attending: Surgery

## 2015-01-18 ENCOUNTER — Ambulatory Visit (HOSPITAL_COMMUNITY)
Admission: RE | Admit: 2015-01-18 | Discharge: 2015-01-18 | Disposition: A | Discharge status: Home / Self Care | Payer: Medicare Other | Source: Ambulatory Visit | Attending: Surgery | Admitting: Surgery

## 2015-01-18 ENCOUNTER — Encounter (HOSPITAL_COMMUNITY): Payer: Self-pay | Admitting: Surgery

## 2015-01-18 DIAGNOSIS — I2510 Atherosclerotic heart disease of native coronary artery without angina pectoris: Secondary | ICD-10-CM | POA: Insufficient documentation

## 2015-01-18 DIAGNOSIS — I4510 Unspecified right bundle-branch block: Secondary | ICD-10-CM | POA: Insufficient documentation

## 2015-01-18 DIAGNOSIS — E119 Type 2 diabetes mellitus without complications: Secondary | ICD-10-CM | POA: Diagnosis not present

## 2015-01-18 DIAGNOSIS — I70211 Atherosclerosis of native arteries of extremities with intermittent claudication, right leg: Secondary | ICD-10-CM | POA: Diagnosis not present

## 2015-01-18 DIAGNOSIS — Z79899 Other long term (current) drug therapy: Secondary | ICD-10-CM | POA: Diagnosis not present

## 2015-01-18 DIAGNOSIS — I7092 Chronic total occlusion of artery of the extremities: Secondary | ICD-10-CM | POA: Diagnosis not present

## 2015-01-18 DIAGNOSIS — E785 Hyperlipidemia, unspecified: Secondary | ICD-10-CM | POA: Diagnosis not present

## 2015-01-18 DIAGNOSIS — Z951 Presence of aortocoronary bypass graft: Secondary | ICD-10-CM | POA: Diagnosis not present

## 2015-01-18 DIAGNOSIS — I739 Peripheral vascular disease, unspecified: Secondary | ICD-10-CM | POA: Diagnosis present

## 2015-01-18 DIAGNOSIS — D471 Chronic myeloproliferative disease: Secondary | ICD-10-CM | POA: Insufficient documentation

## 2015-01-18 DIAGNOSIS — Z955 Presence of coronary angioplasty implant and graft: Secondary | ICD-10-CM | POA: Diagnosis not present

## 2015-01-18 DIAGNOSIS — I70311 Atherosclerosis of unspecified type of bypass graft(s) of the extremities with intermittent claudication, right leg: Secondary | ICD-10-CM | POA: Insufficient documentation

## 2015-01-18 DIAGNOSIS — K5790 Diverticulosis of intestine, part unspecified, without perforation or abscess without bleeding: Secondary | ICD-10-CM | POA: Insufficient documentation

## 2015-01-18 DIAGNOSIS — Z7902 Long term (current) use of antithrombotics/antiplatelets: Secondary | ICD-10-CM | POA: Insufficient documentation

## 2015-01-18 DIAGNOSIS — Z7982 Long term (current) use of aspirin: Secondary | ICD-10-CM | POA: Diagnosis not present

## 2015-01-18 HISTORY — PX: ABDOMINAL ANGIOGRAM: SHX5499

## 2015-01-18 LAB — POCT ACTIVATED CLOTTING TIME
Activated Clotting Time: 233 seconds
Activated Clotting Time: 263 seconds
Activated Clotting Time: 190 seconds
Activated Clotting Time: 171 seconds

## 2015-01-18 LAB — GLUCOSE, CAPILLARY
Glucose-Capillary: 134 mg/dL — ABNORMAL HIGH (ref 70–99)
Glucose-Capillary: 109 mg/dL — ABNORMAL HIGH (ref 70–99)

## 2015-01-18 LAB — POCT I-STAT, CHEM 8
BUN: 13 mg/dL (ref 6–23)
Calcium, Ion: 1.21 mmol/L (ref 1.13–1.30)
Chloride: 102 mmol/L (ref 96–112)
Creatinine, Ser: 1.1 mg/dL (ref 0.50–1.35)
Glucose, Bld: 148 mg/dL — ABNORMAL HIGH (ref 70–99)
HCT: 42 % (ref 39.0–52.0)
Hemoglobin: 14.3 g/dL (ref 13.0–17.0)
Potassium: 4 mmol/L (ref 3.5–5.1)
Sodium: 142 mmol/L (ref 135–145)
TCO2: 27 mmol/L (ref 0–100)

## 2015-01-18 SURGERY — ABDOMINAL ANGIOGRAM
Anesthesia: LOCAL | Laterality: Right

## 2015-01-18 MED ORDER — ALUM & MAG HYDROXIDE-SIMETH 200-200-20 MG/5ML PO SUSP
15.0000 mL | ORAL | Status: DC | PRN
  Filled 2015-01-18: qty 30

## 2015-01-18 MED ORDER — ACETAMINOPHEN 325 MG PO TABS
325.0000 mg | ORAL_TABLET | ORAL | Status: DC | PRN
Start: 2015-01-18 — End: 2015-01-18

## 2015-01-18 MED ORDER — DOCUSATE SODIUM 100 MG PO CAPS: 100.0000 mg | ORAL_CAPSULE | Freq: Every day | ORAL | Status: DC

## 2015-01-18 MED ORDER — MIDAZOLAM HCL 2 MG/2ML IJ SOLN
INTRAMUSCULAR | Status: AC
  Filled 2015-01-18: qty 2

## 2015-01-18 MED ORDER — GUAIFENESIN-DM 100-10 MG/5ML PO SYRP
15.0000 mL | ORAL_SOLUTION | ORAL | Status: DC | PRN
  Filled 2015-01-18: qty 15

## 2015-01-18 MED ORDER — LIDOCAINE HCL (PF) 1 % IJ SOLN
INTRAMUSCULAR | Status: AC
  Filled 2015-01-18: qty 30

## 2015-01-18 MED ORDER — OXYCODONE HCL 5 MG PO TABS: 5.0000 mg | ORAL_TABLET | ORAL | Status: DC | PRN

## 2015-01-18 MED ORDER — PHENOL 1.4 % MT LIQD
1.0000 | OROMUCOSAL | Status: DC | PRN
  Filled 2015-01-18: qty 177

## 2015-01-18 MED ORDER — HYDRALAZINE HCL 20 MG/ML IJ SOLN: 5.0000 mg | INTRAMUSCULAR | Status: DC | PRN

## 2015-01-18 MED ORDER — LABETALOL HCL 5 MG/ML IV SOLN: 10.0000 mg | INTRAVENOUS | Status: DC | PRN

## 2015-01-18 MED ORDER — LIDOCAINE HCL (CARDIAC) 20 MG/ML IV SOLN
INTRAVENOUS | Status: AC
  Filled 2015-01-18: qty 5

## 2015-01-18 MED ORDER — FENTANYL CITRATE 0.05 MG/ML IJ SOLN
INTRAMUSCULAR | Status: AC
  Filled 2015-01-18: qty 2

## 2015-01-18 MED ORDER — ACETAMINOPHEN 325 MG RE SUPP
325.0000 mg | RECTAL | Status: DC | PRN
  Filled 2015-01-18: qty 2

## 2015-01-18 MED ORDER — HEPARIN SODIUM (PORCINE) 1000 UNIT/ML IJ SOLN
INTRAMUSCULAR | Status: AC
  Filled 2015-01-18: qty 1

## 2015-01-18 MED ORDER — ONDANSETRON HCL 4 MG/2ML IJ SOLN: 4.0000 mg | Freq: Four times a day (QID) | INTRAMUSCULAR | Status: DC | PRN

## 2015-01-18 MED ORDER — SODIUM CHLORIDE 0.9 % IV SOLN: 1.0000 mL/kg/h | INTRAVENOUS | Status: DC

## 2015-01-18 MED ORDER — METOPROLOL TARTRATE 1 MG/ML IV SOLN: 2.0000 mg | INTRAVENOUS | Status: DC | PRN

## 2015-01-18 MED ORDER — MORPHINE SULFATE 10 MG/ML IJ SOLN: 2.0000 mg | INTRAMUSCULAR | Status: DC | PRN

## 2015-01-18 MED ORDER — HEPARIN (PORCINE) IN NACL 2-0.9 UNIT/ML-% IJ SOLN
INTRAMUSCULAR | Status: AC
Start: 2015-01-18 — End: 2015-01-18
  Filled 2015-01-18: qty 500

## 2015-01-18 NOTE — Discharge Instructions (Signed)
Angiogram, Care After ° °Refer to this sheet in the next few weeks. These instructions provide you with information on caring for yourself after your procedure. Your health care provider may also give you more specific instructions. Your treatment has been planned according to current medical practices, but problems sometimes occur. Call your health care provider if you have any problems or questions after your procedure.  °WHAT TO EXPECT AFTER THE PROCEDURE °After your procedure, it is typical to have the following sensations: °· Minor discomfort or tenderness and a small bump at the catheter insertion site. The bump should usually decrease in size and tenderness within 1 to 2 weeks. °· Any bruising will usually fade within 2 to 4 weeks. °HOME CARE INSTRUCTIONS  °· You may need to keep taking blood thinners if they were prescribed for you. Take medicines only as directed by your health care provider. °· Do not apply powder or lotion to the site. °· Do not take baths, swim, or use a hot tub until your health care provider approves. °· You may shower 24 hours after the procedure. Remove the bandage (dressing) and gently wash the site with plain soap and water. Gently pat the site dry. °· Inspect the site at least twice daily. °· Limit your activity for the first 24 hours. Do not bend, squat, or lift anything over 10 lb (9 kg) or as directed by your health care provider. °· Plan to have someone take you home after the procedure. Follow instructions about when you can drive or return to work. °SEEK MEDICAL CARE IF: °· You get light-headed when standing up. °· You have drainage (other than a small amount of blood on the dressing). °· You have chills. °· You have a fever. °· You have redness, warmth, swelling, or pain at the insertion site. °SEEK IMMEDIATE MEDICAL CARE IF:  °· You develop chest pain or shortness of breath, feel faint, or pass out. °· You have bleeding, swelling larger than a walnut, or drainage from the  catheter insertion site. °· You develop pain, discoloration, coldness, or severe bruising in the leg or arm that held the catheter. °· You have heavy bleeding from the site. If this happens, hold pressure on the site. °MAKE SURE YOU: °· Understand these instructions. °· Will watch your condition. °· Will get help right away if you are not doing well or get worse. °Document Released: 04/12/2005 Document Revised: 02/08/2014 Document Reviewed: 02/16/2013 °ExitCare® Patient Information ©2015 [employer]. This information is not intended to replace advice given to you by your health care provider. Make sure you discuss any questions you have with your health care provider. ° °

## 2015-01-18 NOTE — Op Note (Signed)
    Patient name: Tyler Whitehead MRN: [id number] DOB: 02-Apr-1941 Sex: male  01/18/2015 Pre-operative Diagnosis: claudication Post-operative diagnosis:  Same Surgeon:  Eldridge Abrahams Procedure Performed:  1.  U/s guided left femoral artery access  2.  Abdominal aortogram  3.  Right leg runoff  4.  3rd order cath  5.  Failed right SFA PTA    Indications:  The patient has a history of a right leg bypass graft at an outside institution which was done to the above-knee popliteal artery with Gore-Tex because he did not have adequate vein.  This graft lasted approximately 2 years.  He has lifestyle limiting claudication.  I have been reluctant to proceed with a below knee bypass with Gore-Tex, and therefore he is here today for possible recanalization and stenting  Procedure:  The patient was identified in the holding area and taken to room 8.  The patient was then placed supine on the table and prepped and draped in the usual sterile fashion.  A time out was called.  Ultrasound was used to evaluate the left common femoral artery.  It was patent .  A digital ultrasound image was acquired.  A micropuncture needle was used to access the left common femoral artery under ultrasound guidance.  An 018 wire was advanced without resistance and a micropuncture sheath was placed.  The 018 wire was removed and a benson wire was placed.  The micropuncture sheath was exchanged for a 5 french sheath.  An omniflush catheter was advanced over the wire to the level of L-1.  An abdominal angiogram was obtained.  Next, using the omniflush catheter and a benson wire, the aortic bifurcation was crossed and the catheter was placed into theright external iliac artery and right runoff was obtained.    Findings:   Aortogram:  No significant renal artery stenosis.  The infrarenal abdominal aorta is widely patent.  No significant external common iliac stenosis bilaterally  Right Lower Extremity:  The right common femoral  artery is patent.  The profunda femoral artery is patent proximally.  There is occlusion of the superficial femoral artery at its origin with reconstitution at the level of the patella.  The below knee popliteal artery appears patent without significant stenosis.  The anterior tibial artery is occluded there is diffuse disease throughout the tibioperoneal trunk.  The dominant runoff vessels of the posterior tibial artery with a diminutive peroneal artery  Left Lower Extremity:  Not evaluated  Intervention:  After the above images were acquired, the decision was made to proceed with intervention.  A 7 French sheath was advanced into the right external iliac artery.  The patient was fully heparinized.  I tried to recanalize the right superficial femoral artery, however I could not get the catheter and wire to go into the appropriate plane.  I tried a quick cross catheter and a Kumpe catheter but could not direct the wire appropriately and then aborted the procedure.  The sheath was withdrawn to the left external iliac artery and the patient be taken the holding area for sheath pull.  Impression:  #1  unsuccessful attempt at right superficial femoral artery recanalization  #2  the patient could potentially be a candidate for redo femoral to above-knee popliteal artery bypass   V. [name], M.D. [facility] Office: [phone] Pager:  [phone]

## 2015-01-18 NOTE — H&P (Signed)
Patient name: Tyler Whitehead: [id number] DOB: [birth year]Sex: male    Chief Complaint  Patient presents with  . Re-evaluation    3 month f/u     HISTORY OF PRESENT ILLNESS: The patient is back today for further discussions regarding his right leg claudication. He had an above-knee femoral-popliteal bypass graft and [place] which lasted 2-3 years. He states that he has pain in his hip and right thigh with activity as well as cramping in his right calf. His calf claudication is debilitating and less than 50 feet. The last time I saw him I elected not to treat him surgically, as he has a history of a femoral above-knee popliteal bypass graft and Leda Gauze which lasted approximately 2.5 years. He did not have an adequate response to cilostazol. He does not have open wounds.  His lisinopril has recently been discontinued. He continues to take Plavix. He is medically managed for diabetes. He has a history of coronary artery disease status post three-vessel CABG. He denies chest pain.  Past Medical History  Diagnosis Date  . Primary myelofibrosis   . Diverticular disease   . Hemorrhoid   . CAD (coronary artery disease)     a. S/p 3V CABG 2000 with LIMA-LAD, left radial-PDA, SVG-OM1. b. 2014: stent to SVG-OM1; then found to have obstruction distal to LAD anastamosis of LIMA s/p DES.   Marland Kitchen Peripheral vascular disease     a. R femoropopliteal bypass ~2012 in [place], reportedly shown to be occluded. Now followed by Dr. [name].  . DM (diabetes mellitus)   . Hyperlipidemia   . HTN (hypertension)   . RBBB     Past Surgical History  Procedure Laterality Date  . Coronary artery bypass graft  2000    LM occluded, LIMA to LAD patent but distal native LAD disease, saphenous vein bypass graft to OM with 95% stenosis in the midportion, right coronary artery occluded, radial artery graft to right coronary artery patent  but was diffusely small. Marginal graft stented. 7 2014. Of note he had multiple previous stents in the right coronary artery. The distal LAD was treated with angioplasty. I  . Cardiac catheterization  May 2014  . Percutaneous coronary stent intervention (pci-s)  April 27 2013  . Vein surgery    . Tee without cardioversion N/A 07/12/2014    Procedure: TRANSESOPHAGEAL ECHOCARDIOGRAM (TEE); Surgeon: Fay Records, MD; Location: [facility] ENDOSCOPY; Service: Cardiovascular; Laterality: N/A;  . Loop recorder implant N/A 07/12/2014    Procedure: LOOP RECORDER IMPLANT; Surgeon: Evans Lance, MD; Location: [facility] CATH LAB; Service: Cardiovascular; Laterality: N/A;  . Abdominal aortagram N/A 09/21/2014    Procedure: ABDOMINAL Maxcine Ham; Surgeon: Elam Dutch, MD; Location: [facility] CATH LAB; Service: Cardiovascular; Laterality: N/A;  . Lower extremity angiogram  09/21/2014    Procedure: LOWER EXTREMITY ANGIOGRAM; Surgeon: Elam Dutch, MD; Location: [facility] CATH LAB; Service: Cardiovascular;;    History   Social History  . Marital Status: Married    Spouse Name: N/A  . Number of Children: N/A  . Years of Education: N/A   Occupational History  . Limo driver    Social History Main Topics  . Smoking status: Never Smoker   . Smokeless tobacco: Never Used  . Alcohol Use: No  . Drug Use: No  . Sexual Activity: Not on file   Other Topics Concern  . Not on file   Social History Narrative   Lives with wife.     Family History  Problem Relation Age  of Onset  . Hyperlipidemia Maternal Grandmother   . Asthma Father   . Heart disease Father   . CAD      Multiple siblings    Allergies as of 12/27/2014  . (No Known Allergies)    Current Outpatient Prescriptions on File Prior to Visit  Medication Sig Dispense Refill  . aspirin EC 81 MG tablet Take 81 mg by  mouth daily.    Marland Kitchen atorvastatin (LIPITOR) 40 MG tablet Take 1.5 tablets (60 mg total) by mouth daily. 135 tablet 3  . carvedilol (COREG) 6.25 MG tablet Take 1 tablet (6.25 mg total) by mouth 2 (two) times daily with a meal. 180 tablet 3  . cholecalciferol (VITAMIN D) 1000 UNITS tablet Take 1,000 Units by mouth daily.    . cilostazol (PLETAL) 50 MG tablet TAKE 1 TABLET BY MOUTH TWICE DAILY. 180 tablet 3  . clopidogrel (PLAVIX) 75 MG tablet Take 1 tablet (75 mg total) by mouth daily with breakfast. 30 tablet 11  . fluticasone (FLONASE) 50 MCG/ACT nasal spray Place 1 spray into both nostrils daily. 16 g 6  . FLUVIRIN SUSP   0  . hydroxyurea (HYDREA) 500 MG capsule Take 1 capsule (500 mg total) by mouth 2 (two) times daily. 60 capsule 3  . Insulin Glargine (LANTUS SOLOSTAR) 100 UNIT/ML Solostar Pen Inject 10 Units into the skin 2 (two) times daily. 15 mL 12  . insulin lispro (HUMALOG) 100 UNIT/ML injection Inject 4 Units into the skin 3 (three) times daily before meals.     . isosorbide mononitrate (IMDUR) 60 MG 24 hr tablet Take 60 mg by mouth daily.    Marland Kitchen levETIRAcetam (KEPPRA) 500 MG tablet Take 1 tablet (500 mg total) by mouth 2 (two) times daily. 60 tablet 6  . Linagliptin-Metformin HCl 2.5-500 MG TABS Take 1 tablet by mouth 2 (two) times daily.    Marland Kitchen lisinopril (PRINIVIL,ZESTRIL) 20 MG tablet Take 1 tablet (20 mg total) by mouth daily. 30 tablet 2  . nitroGLYCERIN (NITROSTAT) 0.4 MG SL tablet Place 1 tablet (0.4 mg total) under the tongue every 5 (five) minutes as needed for chest pain. 25 tablet 12  . PROAIR HFA 108 (90 BASE) MCG/ACT inhaler Inhale 1 puff into the lungs every 4 (four) hours as needed for wheezing or shortness of breath.      No current facility-administered medications on file prior to visit.     REVIEW OF SYSTEMS: Cardiovascular: No chest pain, chest pressure, palpitations, orthopnea, or  dyspnea on exertion. See history of present illness Pulmonary: No productive cough, asthma or wheezing. Neurologic: No weakness, paresthesias, aphasia, or amaurosis. No dizziness. Hematologic: No bleeding problems or clotting disorders. Musculoskeletal: No joint pain or joint swelling. Gastrointestinal: No blood in stool or hematemesis Genitourinary: No dysuria or hematuria. Psychiatric:: No history of major depression. Integumentary: No rashes or ulcers. Constitutional: No fever or chills.  PHYSICAL EXAMINATION:  Vital signs are  Filed Vitals:   12/27/14 1026 12/27/14 1113  BP: 143/81 141/66  Pulse: 77 76  Height: 5\' 7"  (1.702 m)   Weight: 201 lb (91.173 kg)   SpO2: 98%    Body mass index is 31.47 kg/(m^2). General: The patient appears their stated age. HEENT: No gross abnormalities Pulmonary: Non labored breathing Abdomen: Soft and non-tender Musculoskeletal: There are no major deformities. Neurologic: No focal weakness or paresthesias are detected, Skin: There are no ulcer or rashes noted. Psychiatric: The patient has normal affect. Cardiovascular: There is a regular rate and rhythm without  significant murmur appreciated.   Diagnostic Studies I have reviewed his and gram which shows reconstitution of the above-knee popliteal artery at the level of the patella. He does not have an adequate vein.  Assessment: Right leg claudication Plan: The patient cannot tolerate his level of discomfort. I am reluctant to proceed with a below-knee popliteal bypass graft with Gore-Tex given his history of early occlusion from an above-knee bypass. In addition he has relatively poor runoff. He states that he cannot tolerate his level of discomfort and has to have something done. I propose proceeding with an attempt at recanalization and stenting of his occluded right superficial femoral and above-knee popliteal artery. This is been scheduled for Tuesday, April  12  The patient will follow-up with his primary care doctor regarding his hypertension which was repeated. His ACE inhibitor was recently discontinued. He will also follow-up regarding his elevated BMI  V. Leia Alf, M.D. [facility] Office: [phone] Pager: [phone]

## 2015-01-18 NOTE — Progress Notes (Signed)
Site area: LFA Site Prior to Removal:  Level o Pressure Applied For:4min Manual:  yes  Patient Status During Pull:  stable Post Pull Site:  Level 0 Post Pull Instructions Given:  yes Post Pull Pulses Present: doppler Dressing Applied:  clear Bedrest begins @ 1120 Comments:

## 2015-01-19 ENCOUNTER — Telehealth: Payer: Self-pay | Admitting: Surgery

## 2015-01-19 NOTE — Telephone Encounter (Signed)
-----   Message from Denman George, RN sent at 01/18/2015  1:38 PM EDT ----- Regarding: [name]; also needs 2-3 wk. f/u with [facility]/ discuss surgery   ----- Message -----    From: Serafina Mitchell, MD    Sent: 01/18/2015   9:26 AM      To: [facility] Charge Pool  01/18/2015:   Surgeon:  Eldridge Abrahams Procedure Performed:  1.  U/s guided left femoral artery access  2.  Abdominal aortogram  3.  Right leg runoff  4.  3rd order cath  5.  Failed right SFA PTA  Please bring the patient back to my office within the next 2-3 weeks for discussion of femoral-popliteal bypass

## 2015-01-19 NOTE — Telephone Encounter (Signed)
Spoke with pt, dpm °

## 2015-01-20 DIAGNOSIS — Z125 Encounter for screening for malignant neoplasm of prostate: Secondary | ICD-10-CM | POA: Diagnosis not present

## 2015-01-20 DIAGNOSIS — E1140 Type 2 diabetes mellitus with diabetic neuropathy, unspecified: Secondary | ICD-10-CM | POA: Diagnosis not present

## 2015-01-21 ENCOUNTER — Telehealth: Payer: Self-pay | Admitting: Hematology

## 2015-01-21 NOTE — Telephone Encounter (Signed)
Pt called requesting isosorbide refill, instructed pt to call his PCP for this refill.pt voiced understanding.

## 2015-01-24 ENCOUNTER — Telehealth: Payer: Self-pay | Admitting: *Deleted

## 2015-01-24 ENCOUNTER — Other Ambulatory Visit: Payer: Self-pay | Admitting: *Deleted

## 2015-01-24 ENCOUNTER — Encounter: Payer: Self-pay | Admitting: Internal Medicine

## 2015-01-24 DIAGNOSIS — D7581 Myelofibrosis: Secondary | ICD-10-CM

## 2015-01-24 MED ORDER — HYDROXYUREA 500 MG PO CAPS: 500.0000 mg | ORAL_CAPSULE | Freq: Two times a day (BID) | ORAL | Status: DC

## 2015-01-24 NOTE — Telephone Encounter (Signed)
Pt called stating that he was out of his hydrea for a couple of days & needs a refill called to [facility].  He reports taking tid although chart states bid.  Will clarify with Dr Burr Medico.   She suggest pt stay on bid until labs checked & will reevaluate.   Script sent to pharmacy.

## 2015-01-25 DIAGNOSIS — R5381 Other malaise: Secondary | ICD-10-CM | POA: Diagnosis not present

## 2015-01-25 DIAGNOSIS — E1140 Type 2 diabetes mellitus with diabetic neuropathy, unspecified: Secondary | ICD-10-CM | POA: Diagnosis not present

## 2015-01-25 DIAGNOSIS — F329 Major depressive disorder, single episode, unspecified: Secondary | ICD-10-CM | POA: Diagnosis not present

## 2015-01-25 DIAGNOSIS — R112 Nausea with vomiting, unspecified: Secondary | ICD-10-CM | POA: Diagnosis not present

## 2015-01-25 DIAGNOSIS — R61 Generalized hyperhidrosis: Secondary | ICD-10-CM | POA: Diagnosis not present

## 2015-01-26 ENCOUNTER — Ambulatory Visit (HOSPITAL_BASED_OUTPATIENT_CLINIC_OR_DEPARTMENT_OTHER): Payer: Medicare Other | Admitting: Hematology

## 2015-01-26 ENCOUNTER — Telehealth: Payer: Self-pay | Admitting: *Deleted

## 2015-01-26 ENCOUNTER — Other Ambulatory Visit: Payer: Medicare Other

## 2015-01-26 ENCOUNTER — Encounter: Payer: Self-pay | Admitting: Hematology

## 2015-01-26 ENCOUNTER — Telehealth: Payer: Self-pay | Admitting: Hematology

## 2015-01-26 VITALS — BP 149/68 | HR 75 | Temp 98.5°F | Resp 16 | Ht 67.0 in | Wt 197.7 lb

## 2015-01-26 DIAGNOSIS — D45 Polycythemia vera: Secondary | ICD-10-CM

## 2015-01-26 DIAGNOSIS — D7581 Myelofibrosis: Secondary | ICD-10-CM

## 2015-01-26 DIAGNOSIS — R630 Anorexia: Secondary | ICD-10-CM | POA: Diagnosis not present

## 2015-01-26 DIAGNOSIS — R110 Nausea: Secondary | ICD-10-CM

## 2015-01-26 DIAGNOSIS — K30 Functional dyspepsia: Secondary | ICD-10-CM | POA: Diagnosis not present

## 2015-01-26 DIAGNOSIS — I739 Peripheral vascular disease, unspecified: Secondary | ICD-10-CM | POA: Diagnosis not present

## 2015-01-26 DIAGNOSIS — Z8673 Personal history of transient ischemic attack (TIA), and cerebral infarction without residual deficits: Secondary | ICD-10-CM | POA: Diagnosis not present

## 2015-01-26 DIAGNOSIS — E785 Hyperlipidemia, unspecified: Secondary | ICD-10-CM | POA: Diagnosis not present

## 2015-01-26 DIAGNOSIS — R1310 Dysphagia, unspecified: Secondary | ICD-10-CM

## 2015-01-26 NOTE — Telephone Encounter (Signed)
Pt confirmed labs/ov per 04/20 POF, gave pt AVS and Calendar.... KJ, sent msg to add Phlebotomy...Marland KitchenMarland Kitchen

## 2015-01-26 NOTE — Telephone Encounter (Signed)
Called Dr March Rummage office for lab results done yest & they will fax results to [place].

## 2015-01-26 NOTE — Progress Notes (Signed)
[facility] OFFICE PROGRESS NOTE  [name], MD [phone] [address] [place] [address]  DIAGNOSIS: Myelofibrosis/Myeloproliferative disorder (Overlap). JAK2+.  CHIEF COMPLAINS: follow up   CURRENT TREATMENT:    1.Hydrea 500 mg twice daily. 2. Aspirin daily.    Myelofibrosis   12/26/2012 Bone Marrow Biopsy Showed hypercellularity with reticulum fibrosis, JAK-2 mutation positive, BCR/ABL negative, 62 XY in 20 metaphase, and Intermediate-1 ,DIPSS of 2. (WBC < 25; Hbg > 10;    01/02/2013 Imaging US abdomen complete revealed Mild splenogmealy (13.3 cm) and a 2.2 x 1.7 x 1.8 cm left renal cyst   01/15/2013 Pathology Results Integrated hematopathology report.  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    03/17/2013 Imaging CT Chest: Crowded peribronchial markings noted, associated with linear densities involving the left base, probably representing areas of platelike atelectasis/scarring.  No mass or infiltrate seen.    03/19/2013 - 07/20/2013 Chemotherapy Started Jakafi 20 mg bid. Dose reduced due to anemia.  Stopped due to increasing fatigue while on jakafi.    04/28/2013 - 05/01/2013 Hospital Admission Admitted to [facility] by Dr. Francia Greaves, his cardiologist, due to the fact that he had midsternal chest pain.  He had a coronary stent placed this admission.    05/28/2013 - 05/29/2013 Hospital Admission Admitted to [facility] ([place] and received 3 units of blood (Dr. Lendon Colonel note).    06/10/2013 Surgery Cololonoscopy due to GIB.  C/w diverticular disease and hemorrhoids but no active bleeding.    10/05/2013 -  Chemotherapy Started Hydrea 500 mg daily.   03/19/2014 -  Chemotherapy Hydrea increased to 500 mg bid wbc 17, hb 17, hct 54, plt 437   06/29/2014 Procedure cbc shows wbc 18.8 hb 18.7 hct 58.9 plt  456. ?compliance w hydrea as MCV not elevated. symptomatic start TP weekly x 4   07/28/2014 Procedure TP every 2 weeks if hct > 45     INTERVAL HISTORY:  Tyler Whitehead 74 y.o. male with myleofibrosis who returns follow up. He states his dysphagia has been stable. He underwent LE angiography for his his progressive disease, but the stent was not able to put in due to the occlusion. He is likely going to have the procedure repeated again in the near future. He otherwise feels well, no other new complaints.  He saw his PCP yesterday and had lab test. He had an episode of sweating and lightheadedness, and was found to have sugar 99. It resolved on its own.    MEDICAL HISTORY: Past Medical History  Diagnosis Date  . Primary myelofibrosis   . Diverticular disease   . Hemorrhoid   . CAD (coronary artery disease)     a. S/p 3V CABG 2000 with LIMA-LAD, left radial-PDA, SVG-OM1. b. 2014: stent to SVG-OM1; then found to have obstruction distal to LAD anastamosis of LIMA s/p DES.   Marland Kitchen Peripheral vascular disease     a. R femoropopliteal bypass ~2012 in [place], reportedly shown to be occluded. Now followed by Dr. [name].  . DM (diabetes mellitus)   . Hyperlipidemia   . HTN (hypertension)   . RBBB      ALLERGIES:  has No Known Allergies.  MEDICATIONS: has a current medication list which includes the following prescription(s): aspirin ec, atorvastatin, atorvastatin, carvedilol, cholecalciferol, cilostazol, clopidogrel, fluticasone, hydroxyurea, insulin glargine, insulin lispro, isosorbide mononitrate, levetiracetam, linagliptin-metformin hcl, nitroglycerin, proair hfa, and temazepam.  SURGICAL  HISTORY:  Past Surgical History  Procedure Laterality Date  . Coronary artery bypass graft  2000    LM occluded, LIMA to LAD patent but distal native LAD disease, saphenous vein bypass graft to OM with 95% stenosis in the midportion, right coronary artery occluded, radial artery graft to right coronary artery  patent but was diffusely small. Marginal graft stented. 7 2014. Of note he had multiple previous stents in the right coronary artery. The distal LAD was treated with angioplasty. I  . Cardiac catheterization  May 2014  . Percutaneous coronary stent intervention (pci-s)  April 27 2013  . Vein surgery    . Tee without cardioversion N/A 07/12/2014    Procedure: TRANSESOPHAGEAL ECHOCARDIOGRAM (TEE);  Surgeon: Fay Records, MD;  Location: [facility] ENDOSCOPY;  Service: Cardiovascular;  Laterality: N/A;  . Loop recorder implant N/A 07/12/2014    Procedure: LOOP RECORDER IMPLANT;  Surgeon: Evans Lance, MD;  Location: [facility] CATH LAB;  Service: Cardiovascular;  Laterality: N/A;  . Abdominal aortagram N/A 09/21/2014    Procedure: ABDOMINAL Maxcine Ham;  Surgeon: Elam Dutch, MD;  Location: [facility] CATH LAB;  Service: Cardiovascular;  Laterality: N/A;  . Lower extremity angiogram  09/21/2014    Procedure: LOWER EXTREMITY ANGIOGRAM;  Surgeon: Elam Dutch, MD;  Location: [facility] CATH LAB;  Service: Cardiovascular;;  . Abdominal angiogram N/A 01/18/2015    Procedure: ABDOMINAL ANGIOGRAM;  Surgeon: Serafina Mitchell, MD;  Location: [facility] CATH LAB;  Service: Cardiovascular;  Laterality: N/A;     Medication List       This list is accurate as of: 01/26/15 11:20 AM.  Always use your most recent med list.               aspirin EC 81 MG tablet  Take 81 mg by mouth daily.     atorvastatin 20 MG tablet  Commonly known as:  LIPITOR  Take 20 mg by mouth daily.     atorvastatin 40 MG tablet  Commonly known as:  LIPITOR  Take 1 tablet (40 mg total) by mouth daily.     carvedilol 6.25 MG tablet  Commonly known as:  COREG  Take 1 tablet (6.25 mg total) by mouth 2 (two) times daily with a meal.     cholecalciferol 1000 UNITS tablet  Commonly known as:  VITAMIN D  Take 1,000 Units by mouth daily.     cilostazol 50 MG tablet  Commonly known as:  PLETAL  TAKE 1 TABLET BY MOUTH TWICE DAILY.     clopidogrel 75 MG tablet   Commonly known as:  PLAVIX  Take 1 tablet (75 mg total) by mouth daily with breakfast.     fluticasone 50 MCG/ACT nasal spray  Commonly known as:  FLONASE  Place 1 spray into both nostrils daily.     hydroxyurea 500 MG capsule  Commonly known as:  HYDREA  Take 1 capsule (500 mg total) by mouth 2 (two) times daily.     Insulin Glargine 100 UNIT/ML Solostar Pen  Commonly known as:  LANTUS SOLOSTAR  Inject 10 Units into the skin 2 (two) times daily.     insulin lispro 100 UNIT/ML injection  Commonly known as:  HUMALOG  Inject 4 Units into the skin 3 (three) times daily before meals.     isosorbide mononitrate 60 MG 24 hr tablet  Commonly known as:  IMDUR  Take 60-120 mg by mouth 2 (two) times daily. 2 in the morning and 1 in the evening  levETIRAcetam 500 MG tablet  Commonly known as:  KEPPRA  Take 1 tablet (500 mg total) by mouth 2 (two) times daily.     Linagliptin-Metformin HCl 2.5-500 MG Tabs  Take 1 tablet by mouth 2 (two) times daily.     nitroGLYCERIN 0.4 MG SL tablet  Commonly known as:  NITROSTAT  Place 1 tablet (0.4 mg total) under the tongue every 5 (five) minutes as needed for chest pain.     PROAIR HFA 108 (90 BASE) MCG/ACT inhaler  Generic drug:  albuterol  Inhale 1 puff into the lungs every 4 (four) hours as needed for wheezing or shortness of breath.     temazepam 15 MG capsule  Commonly known as:  RESTORIL  Take 15 mg by mouth at bedtime as needed.        REVIEW OF SYSTEMS:   Constitutional: Denies fevers, chills or abnormal weight loss, (+) fatigue  Eyes: Admits to blurriness of vision and floaters Ears, nose, mouth, throat, and face: Denies mucositis or sore throat Respiratory: Denies cough, dyspnea or wheezes Cardiovascular: Denies palpitation, chest discomfort or lower extremity swelling Gastrointestinal:  Denies nausea, heartburn or change in bowel habits Skin: Denies abnormal skin rashes Lymphatics: Denies new lymphadenopathy or easy  bruising Neurological:Denies numbness, tingling or new weaknesses Behavioral/Psych: Mood is stable, no new changes  All other systems were reviewed with the patient and are negative.  PHYSICAL EXAMINATION: ECOG PERFORMANCE STATUS: 1  Blood pressure 149/68, pulse 75, temperature 98.5 F (36.9 C), temperature source Oral, resp. rate 16, height '5\' 7"'  (1.702 m), weight 197 lb 11.2 oz (89.676 kg), SpO2 100 %.  GENERAL:alert, no distress and comfortable; elderly male who appears his stated age.  SKIN: skin color, texture, turgor are normal, no rashes or significant lesions EYES: normal, Conjunctiva are pink and non-injected, sclera clear OROPHARYNX:no exudate, no erythema and lips, buccal mucosa, and tongue normal  NECK: supple, thyroid normal size, non-tender, without nodularity LYMPH:  no palpable lymphadenopathy in the cervical, axillary or supraclavicular LUNGS: clear to auscultation with normal breathing effort, no wheezes or rhonchi HEART: regular rate & rhythm and no murmurs and no lower extremity edema ABDOMEN:abdomen soft, non-tender and normal bowel sounds Musculoskeletal:no cyanosis of digits and no clubbing  NEURO: alert & oriented x 3 with fluent speech, no focal motor/sensory deficits  Labs:  CBC Latest Ref Rng 01/18/2015 12/22/2014 11/30/2014  WBC 4.0 - 10.3 10e3/uL - 14.9(H) 19.9(H)  Hemoglobin 13.0 - 17.0 g/dL 14.3 13.8 14.2  Hematocrit 39.0 - 52.0 % 42.0 46.9 47.5  Platelets 140 - 400 10e3/uL - 442(H) 405(H)    CMP Latest Ref Rng 01/18/2015 12/22/2014 12/14/2014  Glucose 70 - 99 mg/dL 148(H) 137 -  BUN 6 - 23 mg/dL 13 12.2 -  Creatinine 0.50 - 1.35 mg/dL 1.10 1.0 1.20  Sodium 135 - 145 mmol/L 142 144 -  Potassium 3.5 - 5.1 mmol/L 4.0 4.4 -  Chloride 96 - 112 mmol/L 102 - -  CO2 22 - 29 mEq/L - 28 -  Calcium 8.4 - 10.4 mg/dL - 9.4 -  Total Protein 6.4 - 8.3 g/dL - 6.2(L) -  Total Bilirubin 0.20 - 1.20 mg/dL - 0.65 -  Alkaline Phos 40 - 150 U/L - 86 -  AST 5 - 34 U/L -  15 -  ALT 0 - 55 U/L - 21 -     RADIOGRAPHIC STUDIES: CT abdomen and pelvis with contrast on 12/14/2014  IMPRESSION: 1. No acute findings within the abdomen or pelvis.  2. Atherosclerotic disease. 3. Kidney cysts.  ASSESSMENT: Tyler Whitehead 74 y.o. male with a history of Myelofibrosis, jak 2 mutation and symptomatic polycythemia. Wbc and platelets are high as well.   PLAN:   1. Primary Myeloproliferative neoplasm (Myelofibrosis).  -We discussed that this is not curable disease, but treatable. There is a small percentage patient will develop acute leukemia or aplastic anemia later on. -I reviewed his CT of abdomen, which showed normal liver and spleen. -I discussed that he is at high risk for thrombosis secondary to his disease. -We again emphasized the importance of continued Hydrea and normalized his blood counts to prevent further stroke or thrombosis. He agrees to continue Hydrea and compliant with her treatment. -His hematocrit was 42% last week, no phlebotomy today -He is clinically stable, dysphagia and fatigue has improved.  -check CBC monthly and phlebotomy if HCT>=45%  2. Indigestion, nausea, dysphagia and anorexia  -Improved. He recently had a barium swallow evaluation  -Follow up with primary care physician  3. CADz s/p CABG x 3, hypertension  --Continue lisinopril, coreg, aspirin   4. PvDz.  --Continue Imdur as it appearing to have improved some of his claudication symptoms.   5. Dyslipidemia.  --Continue statin therapy.   6. Stroke and seizure in 07/2014 -- follow up with his neurologist   Plan; - continue Hydrea at same dose  -CBC monthly and phlebotomy if HCT>=45% -Return to clinic in 3 months.    All questions were answered. The patient knows to call the clinic with any problems, questions or concerns. We can certainly see the patient much sooner if necessary.  I spent 20 minutes counseling the patient face to face. The total time spent in the  appointment was 25 minutes.  Truitt Merle 01/26/2015

## 2015-01-27 DIAGNOSIS — F331 Major depressive disorder, recurrent, moderate: Secondary | ICD-10-CM | POA: Diagnosis not present

## 2015-01-28 DIAGNOSIS — M2042 Other hammer toe(s) (acquired), left foot: Secondary | ICD-10-CM | POA: Diagnosis not present

## 2015-01-28 DIAGNOSIS — E119 Type 2 diabetes mellitus without complications: Secondary | ICD-10-CM | POA: Diagnosis not present

## 2015-01-28 DIAGNOSIS — L602 Onychogryphosis: Secondary | ICD-10-CM | POA: Diagnosis not present

## 2015-01-31 ENCOUNTER — Ambulatory Visit: Payer: Commercial Managed Care - HMO | Admitting: Dietician

## 2015-02-01 ENCOUNTER — Encounter: Payer: Self-pay | Admitting: Surgery

## 2015-02-02 ENCOUNTER — Telehealth: Payer: Self-pay | Admitting: Family Medicine

## 2015-02-02 ENCOUNTER — Encounter: Payer: Medicare Other | Admitting: Surgery

## 2015-02-02 DIAGNOSIS — M25511 Pain in right shoulder: Secondary | ICD-10-CM | POA: Diagnosis not present

## 2015-02-02 DIAGNOSIS — E1140 Type 2 diabetes mellitus with diabetic neuropathy, unspecified: Secondary | ICD-10-CM | POA: Diagnosis not present

## 2015-02-02 DIAGNOSIS — Z8673 Personal history of transient ischemic attack (TIA), and cerebral infarction without residual deficits: Secondary | ICD-10-CM | POA: Diagnosis not present

## 2015-02-02 DIAGNOSIS — M25512 Pain in left shoulder: Secondary | ICD-10-CM | POA: Diagnosis not present

## 2015-02-02 NOTE — Telephone Encounter (Signed)
Left msg on vm to return my call.    Need to speak with patient about phone call we received from his pcp's office. Erline Levine, CMA for Eloise Levels, FNP called stated that they wanted patient to be worked in with Dr. Delice Lesch urgently. They saw patient this morning for his f/u and he told them that he has been having the same sensation of what felt like blood running down his face underneath his skin. He is concerned about this because he was having that same sensation back in October before he had a stroke. Dr. Delice Lesch is aware of this. I am awaiting patient to return my call. Per Dr. Delice Lesch we can work patient in on her schedule for Friday if this has been going on for a while, if it has just started he needs to go to the ER.

## 2015-02-03 NOTE — Telephone Encounter (Signed)
I called patient again this morning & I was able to reach him. I asked him if he was still having the sxs listed below. He states that he's not having them this morning, but he did have them yesterday & some last night. I did offer him an appt to see Dr. Delice Lesch tomorrow @ 11:30. He did take that appt & I put him on the schedule. I will call his pcp's office and let Erline Levine know that we will be seeing the patient tomorrow.

## 2015-02-04 ENCOUNTER — Telehealth: Payer: Self-pay | Admitting: Family Medicine

## 2015-02-04 ENCOUNTER — Ambulatory Visit (INDEPENDENT_AMBULATORY_CARE_PROVIDER_SITE_OTHER): Payer: Medicare Other | Admitting: Neurology

## 2015-02-04 ENCOUNTER — Encounter: Payer: Self-pay | Admitting: Neurology

## 2015-02-04 VITALS — BP 136/76 | HR 91 | Ht 67.0 in | Wt 198.0 lb

## 2015-02-04 DIAGNOSIS — I639 Cerebral infarction, unspecified: Secondary | ICD-10-CM | POA: Diagnosis not present

## 2015-02-04 DIAGNOSIS — G40209 Localization-related (focal) (partial) symptomatic epilepsy and epileptic syndromes with complex partial seizures, not intractable, without status epilepticus: Secondary | ICD-10-CM | POA: Diagnosis not present

## 2015-02-04 DIAGNOSIS — R200 Anesthesia of skin: Secondary | ICD-10-CM

## 2015-02-04 NOTE — Telephone Encounter (Signed)
-----   Message from Cameron Sprang, MD sent at 02/04/2015  4:28 PM EDT ----- Regarding: ct head results Pls let him know the head CT did not show any evidence of bleed, tumor, or large stroke. It showed old changes from prior stroke. If symptoms worsen, go to ER immediately. Thanks

## 2015-02-04 NOTE — Progress Notes (Signed)
NEUROLOGY FOLLOW UP OFFICE NOTE  Tyler Whitehead [id number]  HISTORY OF PRESENT ILLNESS: I had the pleasure of seeing Tyler Whitehead in follow-up in the neurology clinic on 02/04/2015.  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 He presents today as an urgent visit after he started having a change in symptoms 3-4 days ago with a sensation that something is crawling under his skin in the forehead all the time, running behind both ears. He denies any headaches, no recent head injuries, no nasal congestion. He does have seasonal allergies. He denies any vision changes. No focal numbness/tingling/weakness in his extremities. He has a little nausea after eating. He continues to take Keppra 560m BID with no side effects, no further right visual field changes.   HPI: This is a pleasant 74yo RH man with a history of hypertension, hyperlipidemia, diabetes, CAD s/p CABG and PTCA, peripheral vascular disease s/p fempop bypass, myelofibrosis/myeloproliferative disorder with polycythemia, with new onset seizure last 07/09/14. He reports that he started having headaches around a month prior, with dull pain over the left frontal region. [facility] after his Cardiology and Oncology visits, head CT normal, headache improved with narcotics. He had reported seeing spots in his vision and was scheduled to see an ophthalmologist. He had a normal exam and returned to the ER on 09/28 again for constant left frontal headaches that progressively worsened. He had some nausea and vomiting a few days prior. He was evaluated by Neurology with normal exam. ESR and CRP negative. Headache resolved with dilaudid, Ativan, Reglan, and morphine, and he was discharged home on steroid taper to prevent rebound headaches. He again returned to the ER on 09/30 with the same headache, exam non-focal. He had intermittent blurred vision but appeared chronic. He was given a  cocktail again with resolution of headaches. On 07/09/14, his wife noticed that throughout the day, he kept looking to the right side. He reported inability to see on the right side. At dinner time, he recalls sitting at the table eating, he recalls seeing lights in his right visual field and little black spots like ants, then woke up in the hospital. His wife reported that he got up, then his mouth drooped, his right hand started shaking, then he started having a GTC and fell to the floor. The seizure lasted several minutes. He was initially confused on arrival to the ER then subsequently returned to normal, with initial neurological exam showing dense right homonymous hemianopia. He denied any focal numbness, tingling, or weakness.   I personally reviewed MRI brain which showed an acute left occipital infarct. MRA showed intracranial atherosclerosis without flow-reducing lesion, unremarkable extracranial MRA. He had been taking aspirin and Plavix since stent placement a year ago, this was not changed during his hospitalization. He had a TTE and TEE which showed mild fixed plaque in the thoracic aorta. He had an implantable loop recorder placed. He does report occasional palpitations, no chest pains. Routine EEG normal. He reports that for a week after hospital discharge, he continued to see flashing lights and black spots in his right visual field, this gradually improved, and he currently denies any abnormality in his vision. He denies any further headaches.   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 Otherwise he had a normal birth and early development. There is no history of febrile convulsions, CNS infections such as meningitis/encephalitis, significant traumatic brain injury, neurosurgical procedures,  or family history of seizures  PAST MEDICAL HISTORY: Past Medical History  Diagnosis Date  . Primary myelofibrosis   . Diverticular disease   . Hemorrhoid   . CAD (coronary artery disease)      a. S/p 3V CABG 2000 with LIMA-LAD, left radial-PDA, SVG-OM1. b. 2014: stent to SVG-OM1; then found to have obstruction distal to LAD anastamosis of LIMA s/p DES.   Marland Kitchen Peripheral vascular disease     a. R femoropopliteal bypass ~2012 in [place], reportedly shown to be occluded. Now followed by Dr. [name].  . DM (diabetes mellitus)   . Hyperlipidemia   . HTN (hypertension)   . RBBB     MEDICATIONS: Current Outpatient Prescriptions on File Prior to Visit  Medication Sig Dispense Refill  . aspirin EC 81 MG tablet Take 81 mg by mouth daily.    Marland Kitchen atorvastatin (LIPITOR) 40 MG tablet Take 1 tablet (40 mg total) by mouth daily. 90 tablet 3  . carvedilol (COREG) 6.25 MG tablet Take 1 tablet (6.25 mg total) by mouth 2 (two) times daily with a meal. 180 tablet 3  . cholecalciferol (VITAMIN D) 1000 UNITS tablet Take 1,000 Units by mouth daily.    . cilostazol (PLETAL) 50 MG tablet TAKE 1 TABLET BY MOUTH TWICE DAILY. 180 tablet 3  . clopidogrel (PLAVIX) 75 MG tablet Take 1 tablet (75 mg total) by mouth daily with breakfast. 30 tablet 11  . fluticasone (FLONASE) 50 MCG/ACT nasal spray Place 1 spray into both nostrils daily. 16 g 6  . hydroxyurea (HYDREA) 500 MG capsule Take 1 capsule (500 mg total) by mouth 2 (two) times daily. 60 capsule 0  . Insulin Glargine (LANTUS SOLOSTAR) 100 UNIT/ML Solostar Pen Inject 10 Units into the skin 2 (two) times daily. (Patient taking differently: Inject 10-12 Units into the skin 2 (two) times daily. 12 units in the morning and 10 units in the evening) 15 mL 12  . insulin lispro (HUMALOG) 100 UNIT/ML injection Inject 4 Units into the skin 3 (three) times daily before meals.     . isosorbide mononitrate (IMDUR) 60 MG 24 hr tablet Take 60-120 mg by mouth 2 (two) times daily. 2 in the morning and 1 in the evening    . levETIRAcetam (KEPPRA) 500 MG tablet Take 1 tablet (500 mg total) by mouth 2 (two) times daily. 60 tablet 11  . Linagliptin-Metformin HCl 2.5-500 MG TABS Take 1  tablet by mouth 2 (two) times daily.    . nitroGLYCERIN (NITROSTAT) 0.4 MG SL tablet Place 1 tablet (0.4 mg total) under the tongue every 5 (five) minutes as needed for chest pain. 25 tablet 12  . PROAIR HFA 108 (90 BASE) MCG/ACT inhaler Inhale 1 puff into the lungs every 4 (four) hours as needed for wheezing or shortness of breath.     . temazepam (RESTORIL) 15 MG capsule Take 15 mg by mouth at bedtime as needed.     No current facility-administered medications on file prior to visit.    ALLERGIES: No Known Allergies  FAMILY HISTORY: Family History  Problem Relation Age of Onset  . Hyperlipidemia Maternal Grandmother   . Asthma Father   . Heart disease Father   . CAD      Multiple siblings    SOCIAL HISTORY: History   Social History  . Marital Status: Married    Spouse Name: N/A  . Number of Children: N/A  . Years of Education: N/A   Occupational History  . Limo driver  Social History Main Topics  . Smoking status: Never Smoker   . Smokeless tobacco: Never Used  . Alcohol Use: No  . Drug Use: No  . Sexual Activity: Not on file   Other Topics Concern  . Not on file   Social History Narrative   Lives with wife.     REVIEW OF SYSTEMS: Constitutional: No fevers, chills, or sweats, no generalized fatigue, change in appetite Eyes: No visual changes, double vision, eye pain Ear, nose and throat: No hearing loss, ear pain, nasal congestion, sore throat Cardiovascular: No chest pain, palpitations Respiratory:  No shortness of breath at rest or with exertion, wheezes GastrointestinaI: No nausea, vomiting, diarrhea, abdominal pain, fecal incontinence Genitourinary:  No dysuria, urinary retention or frequency Musculoskeletal:  No neck pain, back pain Integumentary: No rash, pruritus, skin lesions Neurological: as above Psychiatric: No depression, insomnia, anxiety Endocrine: No palpitations, fatigue, diaphoresis, mood swings, change in appetite, change in weight,  increased thirst Hematologic/Lymphatic:  No anemia, purpura, petechiae. Allergic/Immunologic: no itchy/runny eyes, nasal congestion, recent allergic reactions, rashes  PHYSICAL EXAM: Filed Vitals:   02/04/15 1137  BP: 136/76  Pulse: 91   General: No acute distress Head:  Normocephalic/atraumatic Neck: supple, no paraspinal tenderness, full range of motion Heart:  Regular rate and rhythm Lungs:  Clear to auscultation bilaterally Back: No paraspinal tenderness Skin/Extremities: No rash, no edema Neurological Exam: alert and oriented to person, place, and time. No aphasia or dysarthria. Fund of knowledge is appropriate.  Recent and remote memory are intact.  Attention and concentration are normal.    Able to name objects and repeat phrases. Cranial nerves: Pupils equal, round, reactive to light.  Fundoscopic exam unremarkable, no papilledema. Extraocular movements intact with no nystagmus. Visual fields full. Decreased pin, cold, light touch on the right V1-3 distribution. No facial asymmetry. Tongue, uvula, palate midline.  Motor: Bulk and tone normal, muscle strength 5/5 throughout with no pronator drift.  Sensation decreased to light touch, temperature and pin on right hand and LE.Deep tendon reflexes 2+ throughout, toes downgoing.  Finger to nose testing intact.  Gait narrow-based and steady, able to tandem walk adequately.  Romberg negative.  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 He presents as an urgent visit today due to paresthesias in the forehead region radiating down both ears. Etiology of symptoms unclear, his neurological exam shows decreased sensation on the right side, no motor deficits. He is  unable to have an MRI due to cardiac monitor, a head CT will be ordered to assess for acute abnormality. Continue current dose of Keppra 555m BID. Continue aspirin and Plavix, control of other vascular risk factors, for secondary stroke prevention. He is aware of [place] driving laws to stop driving after a seizure, until 6 months seizure-free. He knows to go to the ER immediately for any change in symptoms. He will follow-up in 1 month and knows to call our office for any problems in the interim.  Thank you for allowing me to participate in his care.  Please do not hesitate to call for any questions or concerns.  The duration of this appointment visit was 25 minutes of face-to-face time with the patient.  Greater than 50% of this time was spent in counseling, explanation of diagnosis, planning of further management,  and coordination of care.   Ellouise Newer, M.D.   CC: Dr. Sheryn Bison

## 2015-02-04 NOTE — Patient Instructions (Addendum)
1. Schedule MRI brain without contrast 2. Continue all your medications 3. If symptoms worsen or progress, go to ER immediately 4. Follow-up in 1 month

## 2015-02-04 NOTE — Telephone Encounter (Signed)
Patient was notified of results & advisement. 

## 2015-02-07 ENCOUNTER — Encounter: Payer: Medicare Other | Attending: Family Medicine | Admitting: Dietician

## 2015-02-07 ENCOUNTER — Ambulatory Visit (INDEPENDENT_AMBULATORY_CARE_PROVIDER_SITE_OTHER): Payer: Medicare Other | Admitting: *Deleted

## 2015-02-07 DIAGNOSIS — I6330 Cerebral infarction due to thrombosis of unspecified cerebral artery: Secondary | ICD-10-CM | POA: Diagnosis not present

## 2015-02-07 DIAGNOSIS — Z794 Long term (current) use of insulin: Secondary | ICD-10-CM | POA: Insufficient documentation

## 2015-02-07 DIAGNOSIS — Z713 Dietary counseling and surveillance: Secondary | ICD-10-CM | POA: Diagnosis not present

## 2015-02-07 DIAGNOSIS — E1165 Type 2 diabetes mellitus with hyperglycemia: Secondary | ICD-10-CM | POA: Insufficient documentation

## 2015-02-07 NOTE — Progress Notes (Signed)
  Medical Nutrition Therapy:  Appt start time: 0930 end time:  1045.   Assessment:  11/02/14 Primary concerns today: Improvement of blood sugar control.  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  Patient with PVD and vitamin D deficiency, chronic constipation.  Sees oncologist for polycythemia.  Hx of stroke in October. Patient lives with wife.  Wife and patient shop and cook.  Wife smokes.  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  Now with new medical insurance and stressed about the bills.  Several deaths in family within last month.  Travels a lot.  HgbA1C decreased from 12.9% (07/2014) to 8.4% (10/18/2014).   02/07/15: Patient is here for alone for follow up.  States that he has had some increased stress related to above and has received counseling.  He has been walking a little more with the better weather.  Enjoys to travel and enjoys being near water.  Has considered the [employer] program but not yet initiated.  Per diet hx, intake is lower fat.  No new HgbA1C known.  CBG's in the am are usually 110-135.  Patient complains of indigestion at times.  Preferred Learning Style:   No preference indicated   Learning Readiness:   Ready  MEDICATIONS: see list   DIETARY INTAKE: Usual eating pattern includes 2-3  meals and 1 snacks per day.  24-hr recall:  B ( AM): frosted mini wheats, whole milk, coffee with sugar sub,  juice Snk (AM):  L ( PM): crackers and cheese and vegetable soup Snk ( PM): applesauce D ( PM): meat, baked potato, vegetables or soup Snk ( PM): occasional nuts Beverages: water, 4 oz juice in am, rare beer, coffee with sugar sub or tea with sugar sub  Usual physical activity: walks 1 mile 1-2 x per day.  Estimated energy needs: 1800 calories 200 g carbohydrates 113 g protein 60 g fat  Progress Towards Goal(s):  In progress.   Nutritional Diagnosis:  NB-1.1 Food and nutrition-related knowledge deficit As related to  the balance of carbohydrates, protein, and fat.  As evidenced by diet hx and patient report.    Intervention:  Nutrition counseling.  Continued education on decreasing fat and sugar content as well as including a protein with meals.  Keep up the exercise!  Good job with this! Exercise helps decrease your blood sugar. Consider the [employer] program at [facility]. Have protein in moderation with meals and snacks. Consider choosing unsweetened shredded wheat with half banana and low fat milk and boilded egg for breakfast. Consider reducing the fat content of milk.    Teaching Method Utilized:  Visual Auditory Hands on  Handouts given during visit include:  My Plate  Aff[place] parks.  Barriers to learning/adherence to lifestyle change: patient forgets medicine at times- on this visit, states that he is remembering meds at this time.  Demonstrated degree of understanding via:  Teach Back   Monitoring/Evaluation:  Dietary intake, exercise, label reading, weight in 3 month(s).

## 2015-02-07 NOTE — Patient Instructions (Signed)
Keep up the exercise!  Good job with this! Exercise helps decrease your blood sugar. Consider the [employer] program at [facility]. Have protein in moderation with meals and snacks. Consider choosing unsweetened shredded wheat with half banana and low fat milk and boilded egg for breakfast. Consider reducing the fat content of milk.

## 2015-02-08 ENCOUNTER — Other Ambulatory Visit (HOSPITAL_COMMUNITY): Payer: Self-pay | Admitting: Physician Assistant

## 2015-02-08 DIAGNOSIS — M25512 Pain in left shoulder: Secondary | ICD-10-CM | POA: Diagnosis not present

## 2015-02-08 DIAGNOSIS — I63429 Cerebral infarction due to embolism of unspecified anterior cerebral artery: Secondary | ICD-10-CM

## 2015-02-08 DIAGNOSIS — R1314 Dysphagia, pharyngoesophageal phase: Secondary | ICD-10-CM

## 2015-02-08 DIAGNOSIS — M25511 Pain in right shoulder: Secondary | ICD-10-CM | POA: Diagnosis not present

## 2015-02-09 NOTE — Progress Notes (Signed)
Loop recorder 

## 2015-02-11 ENCOUNTER — Encounter: Payer: Self-pay | Admitting: Neurology

## 2015-02-11 DIAGNOSIS — M25511 Pain in right shoulder: Secondary | ICD-10-CM | POA: Diagnosis not present

## 2015-02-11 DIAGNOSIS — M25512 Pain in left shoulder: Secondary | ICD-10-CM | POA: Diagnosis not present

## 2015-02-14 ENCOUNTER — Ambulatory Visit (HOSPITAL_COMMUNITY)
Admission: RE | Admit: 2015-02-14 | Discharge: 2015-02-14 | Disposition: A | Discharge status: Home / Self Care | Payer: Medicare Other | Source: Ambulatory Visit | Attending: Physician Assistant | Admitting: Physician Assistant

## 2015-02-14 DIAGNOSIS — Z8673 Personal history of transient ischemic attack (TIA), and cerebral infarction without residual deficits: Secondary | ICD-10-CM | POA: Insufficient documentation

## 2015-02-14 DIAGNOSIS — R1314 Dysphagia, pharyngoesophageal phase: Secondary | ICD-10-CM | POA: Insufficient documentation

## 2015-02-14 DIAGNOSIS — I63429 Cerebral infarction due to embolism of unspecified anterior cerebral artery: Secondary | ICD-10-CM

## 2015-02-15 NOTE — Progress Notes (Signed)
   02/14/15 1000  SLP G-Codes **NOT FOR INPATIENT CLASS**  Functional Assessment Tool Used (clinical judgement)  Functional Limitations Swallowing  Swallow Current Status (Z9728) CI  Swallow Goal Status (A0601) CI  Swallow Discharge Status (V6153) CI  SLP Evaluations  $ SLP Speech Visit 1 Procedure  SLP Evaluations  $Swallowing Treatment 1 Procedure  $MBS Swallow Outpatient 1 Procedure

## 2015-02-16 ENCOUNTER — Encounter (HOSPITAL_COMMUNITY): Payer: Self-pay | Admitting: Emergency Medicine

## 2015-02-16 ENCOUNTER — Emergency Department (HOSPITAL_COMMUNITY): Payer: Medicare Other

## 2015-02-16 ENCOUNTER — Emergency Department (HOSPITAL_COMMUNITY)
Admission: EM | Admit: 2015-02-16 | Discharge: 2015-02-16 | Disposition: A | Discharge status: Home / Self Care | Payer: Medicare Other | Attending: Emergency Medicine | Admitting: Emergency Medicine

## 2015-02-16 DIAGNOSIS — Z7902 Long term (current) use of antithrombotics/antiplatelets: Secondary | ICD-10-CM | POA: Insufficient documentation

## 2015-02-16 DIAGNOSIS — R109 Unspecified abdominal pain: Secondary | ICD-10-CM | POA: Diagnosis not present

## 2015-02-16 DIAGNOSIS — M25559 Pain in unspecified hip: Secondary | ICD-10-CM | POA: Diagnosis not present

## 2015-02-16 DIAGNOSIS — Z794 Long term (current) use of insulin: Secondary | ICD-10-CM | POA: Diagnosis not present

## 2015-02-16 DIAGNOSIS — I10 Essential (primary) hypertension: Secondary | ICD-10-CM | POA: Diagnosis not present

## 2015-02-16 DIAGNOSIS — Z7982 Long term (current) use of aspirin: Secondary | ICD-10-CM | POA: Insufficient documentation

## 2015-02-16 DIAGNOSIS — Z7951 Long term (current) use of inhaled steroids: Secondary | ICD-10-CM | POA: Diagnosis not present

## 2015-02-16 DIAGNOSIS — Z86018 Personal history of other benign neoplasm: Secondary | ICD-10-CM | POA: Diagnosis not present

## 2015-02-16 DIAGNOSIS — E785 Hyperlipidemia, unspecified: Secondary | ICD-10-CM | POA: Diagnosis not present

## 2015-02-16 DIAGNOSIS — R110 Nausea: Secondary | ICD-10-CM | POA: Insufficient documentation

## 2015-02-16 DIAGNOSIS — Z79899 Other long term (current) drug therapy: Secondary | ICD-10-CM | POA: Insufficient documentation

## 2015-02-16 DIAGNOSIS — E119 Type 2 diabetes mellitus without complications: Secondary | ICD-10-CM | POA: Insufficient documentation

## 2015-02-16 DIAGNOSIS — R1032 Left lower quadrant pain: Secondary | ICD-10-CM | POA: Diagnosis not present

## 2015-02-16 DIAGNOSIS — Z8719 Personal history of other diseases of the digestive system: Secondary | ICD-10-CM | POA: Insufficient documentation

## 2015-02-16 DIAGNOSIS — Z951 Presence of aortocoronary bypass graft: Secondary | ICD-10-CM | POA: Insufficient documentation

## 2015-02-16 DIAGNOSIS — I2510 Atherosclerotic heart disease of native coronary artery without angina pectoris: Secondary | ICD-10-CM | POA: Insufficient documentation

## 2015-02-16 DIAGNOSIS — I739 Peripheral vascular disease, unspecified: Secondary | ICD-10-CM | POA: Insufficient documentation

## 2015-02-16 DIAGNOSIS — K5730 Diverticulosis of large intestine without perforation or abscess without bleeding: Secondary | ICD-10-CM | POA: Diagnosis not present

## 2015-02-16 DIAGNOSIS — R161 Splenomegaly, not elsewhere classified: Secondary | ICD-10-CM | POA: Diagnosis not present

## 2015-02-16 LAB — URINALYSIS, ROUTINE W REFLEX MICROSCOPIC
Bilirubin Urine: NEGATIVE
Glucose, UA: NEGATIVE mg/dL
Hgb urine dipstick: NEGATIVE
Ketones, ur: NEGATIVE mg/dL
Leukocytes, UA: NEGATIVE
Nitrite: NEGATIVE
Protein, ur: NEGATIVE mg/dL
Specific Gravity, Urine: 1.016 (ref 1.005–1.030)
Urobilinogen, UA: 0.2 mg/dL (ref 0.0–1.0)
pH: 5 (ref 5.0–8.0)

## 2015-02-16 LAB — BASIC METABOLIC PANEL
Anion gap: 7 (ref 5–15)
BUN: 9 mg/dL (ref 6–20)
CO2: 28 mmol/L (ref 22–32)
Calcium: 9.1 mg/dL (ref 8.9–10.3)
Chloride: 105 mmol/L (ref 101–111)
Creatinine, Ser: 0.93 mg/dL (ref 0.61–1.24)
GFR calc Af Amer: >60 mL/min (ref >60)
GFR calc non Af Amer: >60 mL/min (ref >60)
Glucose, Bld: 159 mg/dL — ABNORMAL HIGH (ref 70–99)
Potassium: 4.1 mmol/L (ref 3.5–5.1)
Sodium: 140 mmol/L (ref 135–145)

## 2015-02-16 LAB — CBC WITH DIFFERENTIAL/PLATELET
Basophils Absolute: 0.2 10*3/uL — ABNORMAL HIGH (ref 0.0–0.1)
Basophils Relative: 2 % — ABNORMAL HIGH (ref 0–1)
Eosinophils Absolute: 0.3 10*3/uL (ref 0.0–0.7)
Eosinophils Relative: 3 % (ref 0–5)
HCT: 42.2 % (ref 39.0–52.0)
Hemoglobin: 12.7 g/dL — ABNORMAL LOW (ref 13.0–17.0)
Lymphocytes Relative: 16 % (ref 12–46)
Lymphs Abs: 1.6 10*3/uL (ref 0.7–4.0)
MCH: 24.8 pg — ABNORMAL LOW (ref 26.0–34.0)
MCHC: 30.1 g/dL (ref 30.0–36.0)
MCV: 82.3 fL (ref 78.0–100.0)
Monocytes Absolute: 0.8 10*3/uL (ref 0.1–1.0)
Monocytes Relative: 8 % (ref 3–12)
Neutro Abs: 7.1 10*3/uL (ref 1.7–7.7)
Neutrophils Relative %: 71 % (ref 43–77)
Platelets: 282 10*3/uL (ref 150–400)
RBC: 5.13 MIL/uL (ref 4.22–5.81)
RDW: 18.6 % — ABNORMAL HIGH (ref 11.5–15.5)
WBC: 9.9 10*3/uL (ref 4.0–10.5)

## 2015-02-16 MED ORDER — IOHEXOL 300 MG/ML  SOLN
25.0000 mL | INTRAMUSCULAR | Status: AC
  Administered 2015-02-16: 25 mL via ORAL

## 2015-02-16 MED ORDER — IOHEXOL 300 MG/ML  SOLN
100.0000 mL | Freq: Once | INTRAMUSCULAR | Status: AC | PRN
  Administered 2015-02-16: 100 mL via INTRAVENOUS

## 2015-02-16 MED ORDER — FENTANYL CITRATE (PF) 100 MCG/2ML IJ SOLN
50.0000 ug | Freq: Once | INTRAMUSCULAR | Status: AC
  Administered 2015-02-16: 50 ug via INTRAVENOUS
  Filled 2015-02-16: qty 2

## 2015-02-16 MED ORDER — DOCUSATE SODIUM 100 MG PO CAPS: 100.0000 mg | ORAL_CAPSULE | Freq: Two times a day (BID) | ORAL | Status: DC

## 2015-02-16 MED ORDER — HYDROCODONE-ACETAMINOPHEN 5-325 MG PO TABS: 1.0000 | ORAL_TABLET | Freq: Four times a day (QID) | ORAL | Status: DC | PRN

## 2015-02-16 MED ORDER — ONDANSETRON HCL 4 MG/2ML IJ SOLN
4.0000 mg | Freq: Once | INTRAMUSCULAR | Status: AC
  Administered 2015-02-16: 4 mg via INTRAVENOUS
  Filled 2015-02-16: qty 2

## 2015-02-16 NOTE — ED Notes (Signed)
Pt. reports intermittent  left flank pain onset 3 days ago , denies hematuria or dysuria , no injury or fall.

## 2015-02-16 NOTE — ED Notes (Signed)
Pt states that pain increases when he moves

## 2015-02-16 NOTE — ED Notes (Signed)
MD at bedside. 

## 2015-02-16 NOTE — ED Notes (Signed)
Pt states that he has had diverticulosis before and the pain that he is feeling now is similar to what he felt before

## 2015-02-16 NOTE — Discharge Instructions (Signed)
Flank Pain Flank pain refers to pain that is located on the side of the body between the upper abdomen and the back. The pain may occur over a short period of time (acute) or may be long-term or reoccurring (chronic). It may be mild or severe. Flank pain can be caused by many things. CAUSES  Some of the more common causes of flank pain include:  Muscle strains.   Muscle spasms.   A disease of your spine (vertebral disk disease).   A lung infection (pneumonia).   Fluid around your lungs (pulmonary edema).   A kidney infection.   Kidney stones.   A very painful skin rash caused by the chickenpox virus (shingles).   Gallbladder disease.  [place] care will depend on the cause of your pain. In general,  Rest as directed by your caregiver.  Drink enough fluids to keep your urine clear or pale yellow.  Only take over-the-counter or prescription medicines as directed by your caregiver. Some medicines may help relieve the pain.  Tell your caregiver about any changes in your pain.  Follow up with your caregiver as directed. SEEK IMMEDIATE MEDICAL CARE IF:   Your pain is not controlled with medicine.   You have new or worsening symptoms.  Your pain increases.   You have abdominal pain.   You have shortness of breath.   You have persistent nausea or vomiting.   You have swelling in your abdomen.   You feel faint or pass out.   You have blood in your urine.  You have a fever or persistent symptoms for more than 2-3 days.  You have a fever and your symptoms suddenly get worse. MAKE SURE YOU:   Understand these instructions.  Will watch your condition.  Will get help right away if you are not doing well or get worse. Document Released: 11/15/2005 Document Revised: 06/18/2012 Document Reviewed: 05/08/2012 [facility] Patient Information 2015 [place], [place]. This information is not intended to replace advice given to you by your  health care provider. Make sure you discuss any questions you have with your health care provider.   Muscle Strain A muscle strain is an injury that occurs when a muscle is stretched beyond its normal length. Usually a small number of muscle fibers are torn when this happens. Muscle strain is rated in degrees. First-degree strains have the least amount of muscle fiber tearing and pain. Second-degree and third-degree strains have increasingly more tearing and pain.  Usually, recovery from muscle strain takes 1-2 weeks. Complete healing takes 5-6 weeks.  CAUSES  Muscle strain happens when a sudden, violent force placed on a muscle stretches it too far. This may occur with lifting, sports, or a fall.  RISK FACTORS Muscle strain is especially common in athletes.  SIGNS AND SYMPTOMS At the site of the muscle strain, there may be:  Pain.  Bruising.  Swelling.  Difficulty using the muscle due to pain or lack of normal function. DIAGNOSIS  Your health care provider will perform a physical exam and ask about your medical history. TREATMENT  Often, the best treatment for a muscle strain is resting, icing, and applying cold compresses to the injured area.  HOME CARE INSTRUCTIONS   Use the PRICE method of treatment to promote muscle healing during the first 2-3 days after your injury. The PRICE method involves:  Protecting the muscle from being injured again.  Restricting your activity and resting the injured body part.  Icing your injury. To  do this, put ice in a plastic bag. Place a towel between your skin and the bag. Then, apply the ice and leave it on from 15-20 minutes each hour. After the third day, switch to moist heat packs.  Apply compression to the injured area with a splint or elastic bandage. Be careful not to wrap it too tightly. This may interfere with blood circulation or increase swelling.  Elevate the injured body part above the level of your heart as often as you  can.  Only take over-the-counter or prescription medicines for pain, discomfort, or fever as directed by your health care provider.  Warming up prior to exercise helps to prevent future muscle strains. SEEK MEDICAL CARE IF:   You have increasing pain or swelling in the injured area.  You have numbness, tingling, or a significant loss of strength in the injured area. MAKE SURE YOU:   Understand these instructions.  Will watch your condition.  Will get help right away if you are not doing well or get worse. Document Released: 09/24/2005 Document Revised: 07/15/2013 Document Reviewed: 04/23/2013 [facility] Patient Information 2015 [place], [place]. This information is not intended to replace advice given to you by your health care provider. Make sure you discuss any questions you have with your health care provider.

## 2015-02-16 NOTE — ED Notes (Signed)
Notified CT that pt is done with contrast  

## 2015-02-16 NOTE — ED Provider Notes (Signed)
TIME SEEN: 7:20 AM  CHIEF COMPLAINT: Left flank and abdominal pain  HPI: Pt is a 74 y.o. male with history of coronary artery disease status post CABG, diabetes, hypertension, hyperlipidemia, peripheral vascular disease, diverticulosis who presents emergency department with left flank and abdominal pain that started last week and has progressively worsened. States this feels similar to his diverticulitis but he has not had diarrhea, bloody stool, melena. No fever or vomiting. Has felt nauseous. He states however the pain is also worse whenever he moves, twists. It is better when he is laying still. No radiation of pain anywhere other than the abdomen. Described as moderate in nature. Denies injury to his back and increased physical exertion. No numbness, tingling or focal weakness. No bowel or bladder incontinence.  ROS: See HPI Constitutional: no fever  Eyes: no drainage  ENT: no runny nose   Cardiovascular:  no chest pain  Resp: no SOB  GI: no vomiting GU: no dysuria Integumentary: no rash  Allergy: no hives  Musculoskeletal: no leg swelling  Neurological: no slurred speech ROS otherwise negative  PAST MEDICAL HISTORY/PAST SURGICAL HISTORY:  Past Medical History  Diagnosis Date  . Primary myelofibrosis   . Diverticular disease   . Hemorrhoid   . CAD (coronary artery disease)     a. S/p 3V CABG 2000 with LIMA-LAD, left radial-PDA, SVG-OM1. b. 2014: stent to SVG-OM1; then found to have obstruction distal to LAD anastamosis of LIMA s/p DES.   Marland Kitchen Peripheral vascular disease     a. R femoropopliteal bypass ~2012 in [place], reportedly shown to be occluded. Now followed by Dr. [name].  . DM (diabetes mellitus)   . Hyperlipidemia   . HTN (hypertension)   . RBBB     MEDICATIONS:  Prior to Admission medications   Medication Sig Start Date End Date Taking? Authorizing Provider  aspirin EC 81 MG tablet Take 81 mg by mouth daily.   Yes Historical Provider, MD  atorvastatin (LIPITOR) 40 MG  tablet Take 1 tablet (40 mg total) by mouth daily. 12/30/14  Yes Minus Breeding, MD  carvedilol (COREG) 6.25 MG tablet Take 1 tablet (6.25 mg total) by mouth 2 (two) times daily with a meal. 12/24/14  Yes Minus Breeding, MD  cholecalciferol (VITAMIN D) 1000 UNITS tablet Take 1,000 Units by mouth daily.   Yes Historical Provider, MD  cilostazol (PLETAL) 50 MG tablet TAKE 1 TABLET BY MOUTH TWICE DAILY. 10/19/14  Yes Minus Breeding, MD  clopidogrel (PLAVIX) 75 MG tablet Take 1 tablet (75 mg total) by mouth daily with breakfast. 12/13/14  Yes Minus Breeding, MD  fluticasone (FLONASE) 50 MCG/ACT nasal spray Place 1 spray into both nostrils daily. 07/16/14  Yes Reyne Dumas, MD  hydroxyurea (HYDREA) 500 MG capsule Take 1 capsule (500 mg total) by mouth 2 (two) times daily. 01/24/15  Yes Truitt Merle, MD  Insulin Glargine (LANTUS SOLOSTAR) 100 UNIT/ML Solostar Pen Inject 10 Units into the skin 2 (two) times daily. Patient taking differently: Inject 10-12 Units into the skin 2 (two) times daily. 12 units in the morning and 10 units in the evening 07/16/14  Yes Reyne Dumas, MD  insulin lispro (HUMALOG) 100 UNIT/ML injection Inject 4 Units into the skin 3 (three) times daily before meals.    Yes Historical Provider, MD  isosorbide mononitrate (IMDUR) 60 MG 24 hr tablet Take 60-120 mg by mouth 2 (two) times daily. 2 in the morning and 1 in the evening   Yes Historical Provider, MD  levETIRAcetam (KEPPRA) 500  MG tablet Take 1 tablet (500 mg total) by mouth 2 (two) times daily. 01/03/15  Yes Cameron Sprang, MD  Linagliptin-Metformin HCl 2.5-500 MG TABS Take 1 tablet by mouth 2 (two) times daily.   Yes Historical Provider, MD  nitroGLYCERIN (NITROSTAT) 0.4 MG SL tablet Place 1 tablet (0.4 mg total) under the tongue every 5 (five) minutes as needed for chest pain. 12/10/14  Yes Minus Breeding, MD  PROAIR HFA 108 (90 BASE) MCG/ACT inhaler Inhale 1 puff into the lungs every 4 (four) hours as needed for wheezing or shortness of  breath.  12/03/13  Yes Historical Provider, MD  temazepam (RESTORIL) 15 MG capsule Take 15 mg by mouth at bedtime as needed. 01/15/15  Yes Historical Provider, MD    ALLERGIES:  No Known Allergies  SOCIAL HISTORY:  History  Substance Use Topics  . Smoking status: Never Smoker   . Smokeless tobacco: Never Used  . Alcohol Use: No    FAMILY HISTORY: Family History  Problem Relation Age of Onset  . Hyperlipidemia Maternal Grandmother   . Asthma Father   . Heart disease Father   . CAD      Multiple siblings    EXAM: BP 130/62 mmHg  Pulse 72  Temp(Src) 98.2 F (36.8 C) (Oral)  Resp 18  Ht 5\' 7"  (1.702 m)  Wt 197 lb (89.359 kg)  BMI 30.85 kg/m2  SpO2 98% CONSTITUTIONAL: Alert and oriented and responds appropriately to questions. Well-appearing; well-nourished, elderly, nontoxic, smiling HEAD: Normocephalic EYES: Conjunctivae clear, PERRL ENT: normal nose; no rhinorrhea; moist mucous membranes; pharynx without lesions noted NECK: Supple, no meningismus, no LAD  CARD: RRR; S1 and S2 appreciated; no murmurs, no clicks, no rubs, no gallops RESP: Normal chest excursion without splinting or tachypnea; breath sounds clear and equal bilaterally; no wheezes, no rhonchi, no rales, no hypoxia or respiratory distress, speaking full sentences ABD/GI: Normal bowel sounds; non-distended; soft, nontender to palpation in the left lower quadrant, no rebound, no guarding, no peritoneal signs BACK:  The back appears normal and is non-tender to palpation, there is no CVA tenderness, no midline spinal tenderness or step-off or deformity EXT: Normal ROM in all joints; non-tender to palpation; no edema; normal capillary refill; no cyanosis, no calf tenderness or swelling, equal pulses in all 4 extremities    SKIN: Normal color for age and race; warm NEURO: Moves all extremities equally, sensation to light touch intact diffusely, cranial nerves II through XII intact PSYCH: The patient's mood and manner  are appropriate. Grooming and personal hygiene are appropriate.  MEDICAL DECISION MAKING: Patient here with left flank and left abdominal pain. It may be muscular skeletal nature versus diverticulitis, kidney stone, UTI. Will give pain and nausea medicine, obtain labs, urine and CT of his abdomen and pelvis. Lower suspicion for dissection or AAA given how well-appearing patient is with normal blood pressure.   ED PROGRESS: Patient reports his pain is improved after IV fentanyl. CT scan unremarkable. No diverticulitis, stones, AAA. Blood work, urine unremarkable. I feel he is safe to be discharged home. I suspect this is muscular skeletal. We'll discharge with pain medication. He reports his nausea medicine at home. Discussed with patient if pain worsens or he develops new symptoms to return to the ED. Have advised him to follow-up with his PCP if pain does not improve in one week. He verbalizes understanding and is comfortable with plan.     [place], DO 02/16/15 1013

## 2015-02-18 ENCOUNTER — Encounter: Payer: Self-pay | Admitting: Surgery

## 2015-02-18 DIAGNOSIS — M25511 Pain in right shoulder: Secondary | ICD-10-CM | POA: Diagnosis not present

## 2015-02-18 DIAGNOSIS — M25512 Pain in left shoulder: Secondary | ICD-10-CM | POA: Diagnosis not present

## 2015-02-21 ENCOUNTER — Encounter: Payer: Self-pay | Admitting: Surgery

## 2015-02-21 ENCOUNTER — Other Ambulatory Visit: Payer: Self-pay | Admitting: *Deleted

## 2015-02-21 ENCOUNTER — Ambulatory Visit: Payer: Medicare Other

## 2015-02-21 ENCOUNTER — Ambulatory Visit (INDEPENDENT_AMBULATORY_CARE_PROVIDER_SITE_OTHER): Payer: Medicare Other | Admitting: Surgery

## 2015-02-21 ENCOUNTER — Other Ambulatory Visit (HOSPITAL_BASED_OUTPATIENT_CLINIC_OR_DEPARTMENT_OTHER): Payer: Medicare Other

## 2015-02-21 VITALS — BP 124/73 | HR 77 | Ht 67.0 in | Wt 199.0 lb

## 2015-02-21 DIAGNOSIS — D7581 Myelofibrosis: Secondary | ICD-10-CM

## 2015-02-21 DIAGNOSIS — M25511 Pain in right shoulder: Secondary | ICD-10-CM | POA: Diagnosis not present

## 2015-02-21 DIAGNOSIS — M25512 Pain in left shoulder: Secondary | ICD-10-CM | POA: Diagnosis not present

## 2015-02-21 DIAGNOSIS — I6330 Cerebral infarction due to thrombosis of unspecified cerebral artery: Secondary | ICD-10-CM | POA: Diagnosis not present

## 2015-02-21 DIAGNOSIS — I70219 Atherosclerosis of native arteries of extremities with intermittent claudication, unspecified extremity: Secondary | ICD-10-CM

## 2015-02-21 DIAGNOSIS — I739 Peripheral vascular disease, unspecified: Secondary | ICD-10-CM

## 2015-02-21 DIAGNOSIS — D45 Polycythemia vera: Secondary | ICD-10-CM

## 2015-02-21 LAB — CBC & DIFF AND RETIC
BASO%: 1.1 % (ref 0.0–2.0)
Basophils Absolute: 0.2 10*3/uL — ABNORMAL HIGH (ref 0.0–0.1)
EOS%: 4.1 % (ref 0.0–7.0)
Eosinophils Absolute: 0.6 10*3/uL — ABNORMAL HIGH (ref 0.0–0.5)
HCT: 44.5 % (ref 38.4–49.9)
HGB: 13.3 g/dL (ref 13.0–17.1)
Immature Retic Fract: 17.4 % — ABNORMAL HIGH (ref 3.00–10.60)
LYMPH%: 11.4 % — ABNORMAL LOW (ref 14.0–49.0)
MCH: 25 pg — ABNORMAL LOW (ref 27.2–33.4)
MCHC: 29.9 g/dL — ABNORMAL LOW (ref 32.0–36.0)
MCV: 83.6 fL (ref 79.3–98.0)
MONO#: 1 10*3/uL — ABNORMAL HIGH (ref 0.1–0.9)
MONO%: 6.5 % (ref 0.0–14.0)
NEUT#: 11.3 10*3/uL — ABNORMAL HIGH (ref 1.5–6.5)
NEUT%: 76.9 % — ABNORMAL HIGH (ref 39.0–75.0)
Platelets: 370 10*3/uL (ref 140–400)
RBC: 5.32 10*6/uL (ref 4.20–5.82)
RDW: 18.4 % — ABNORMAL HIGH (ref 11.0–14.6)
Retic %: 1.56 % (ref 0.80–1.80)
Retic Ct Abs: 82.99 10*3/uL (ref 34.80–93.90)
WBC: 14.7 10*3/uL — ABNORMAL HIGH (ref 4.0–10.3)
lymph#: 1.7 10*3/uL (ref 0.9–3.3)

## 2015-02-21 NOTE — Progress Notes (Signed)
Patient name: Tyler Whitehead MRN: [id number] DOB: [birth year] Sex: male     Chief Complaint  Patient presents with  . Re-evaluation    2-3 wk f/u abdominal aortogram, failed Rt SFA PTA    HISTORY OF PRESENT ILLNESS: The patient is back today for further discussions regarding his right leg claudication. He had an above-knee femoral-popliteal bypass graft and [place] which lasted 2-3 years. He states that he has pain in his hip and right thigh with activity as well as cramping in his right calf. His calf claudication is debilitating and less than 50 feet. The last time I saw him I elected not to treat him surgically, as he has a history of a femoral above-knee popliteal bypass graft and Leda Gauze which lasted approximately 2.5 years. He did not have an adequate response to cilostazol. He does not have open wounds.  He recently underwent angiographywhich confirmed occlusion of his bypass graft.  There was reconstitution of the popliteal artery at the level of the patella.  The below knee popliteal artery was widely patent.  The tibioperoneal trunk was diffusely disease.  The anterior tibial artery was occluded.  The dominant runoff was through the peroneal and posterior tibial artery.  Today, the patient states that his symptoms can be tolerated.  He does get pain with walking but denies rest pain or open wounds.  He feels that he can live with his symptoms  Past Medical History  Diagnosis Date  . Primary myelofibrosis   . Diverticular disease   . Hemorrhoid   . CAD (coronary artery disease)     a. S/p 3V CABG 2000 with LIMA-LAD, left radial-PDA, SVG-OM1. b. 2014: stent to SVG-OM1; then found to have obstruction distal to LAD anastamosis of LIMA s/p DES.   Marland Kitchen Peripheral vascular disease     a. R femoropopliteal bypass ~2012 in [place], reportedly shown to be occluded. Now followed by Dr. [name].  . DM (diabetes mellitus)   . Hyperlipidemia   . HTN (hypertension)   . RBBB     Past  Surgical History  Procedure Laterality Date  . Coronary artery bypass graft  2000    LM occluded, LIMA to LAD patent but distal native LAD disease, saphenous vein bypass graft to OM with 95% stenosis in the midportion, right coronary artery occluded, radial artery graft to right coronary artery patent but was diffusely small. Marginal graft stented. 7 2014. Of note he had multiple previous stents in the right coronary artery. The distal LAD was treated with angioplasty. I  . Cardiac catheterization  May 2014  . Percutaneous coronary stent intervention (pci-s)  April 27 2013  . Vein surgery    . Tee without cardioversion N/A 07/12/2014    Procedure: TRANSESOPHAGEAL ECHOCARDIOGRAM (TEE);  Surgeon: Fay Records, MD;  Location: [facility] ENDOSCOPY;  Service: Cardiovascular;  Laterality: N/A;  . Loop recorder implant N/A 07/12/2014    Procedure: LOOP RECORDER IMPLANT;  Surgeon: Evans Lance, MD;  Location: [facility] CATH LAB;  Service: Cardiovascular;  Laterality: N/A;  . Abdominal aortagram N/A 09/21/2014    Procedure: ABDOMINAL Maxcine Ham;  Surgeon: Elam Dutch, MD;  Location[facility] CATH LAB;  Service: Cardiovascular;  Laterality: N/A;  . Lower extremity angiogram  09/21/2014    Procedure: LOWER EXTREMITY ANGIOGRAM;  Surgeon: Elam Dutch, MD;  Location: [facility] CATH LAB;  Service: Cardiovascular;;  . Abdominal angiogram N/A 01/18/2015    Procedure: ABDOMINAL ANGIOGRAM;  Surgeon: Serafina Mitchell, MD;  Location: [facility]  CATH LAB;  Service: Cardiovascular;  Laterality: N/A;    History   Social History  . Marital Status: Married    Spouse Name: N/A  . Number of Children: N/A  . Years of Education: N/A   Occupational History  . Limo driver    Social History Main Topics  . Smoking status: Never Smoker   . Smokeless tobacco: Never Used  . Alcohol Use: No  . Drug Use: No  . Sexual Activity: Not on file   Other Topics Concern  . Not on file   Social History Narrative   Lives with wife.     Family History   Problem Relation Age of Onset  . Hyperlipidemia Maternal Grandmother   . Asthma Father   . Heart disease Father   . CAD      Multiple siblings    Allergies as of 02/21/2015  . (No Known Allergies)    Current Outpatient Prescriptions on File Prior to Visit  Medication Sig Dispense Refill  . aspirin EC 81 MG tablet Take 81 mg by mouth daily.    Marland Kitchen atorvastatin (LIPITOR) 40 MG tablet Take 1 tablet (40 mg total) by mouth daily. 90 tablet 3  . carvedilol (COREG) 6.25 MG tablet Take 1 tablet (6.25 mg total) by mouth 2 (two) times daily with a meal. 180 tablet 3  . cholecalciferol (VITAMIN D) 1000 UNITS tablet Take 1,000 Units by mouth daily.    . cilostazol (PLETAL) 50 MG tablet TAKE 1 TABLET BY MOUTH TWICE DAILY. 180 tablet 3  . clopidogrel (PLAVIX) 75 MG tablet Take 1 tablet (75 mg total) by mouth daily with breakfast. 30 tablet 11  . docusate sodium (COLACE) 100 MG capsule Take 1 capsule (100 mg total) by mouth every 12 (twelve) hours. 60 capsule 0  . fluticasone (FLONASE) 50 MCG/ACT nasal spray Place 1 spray into both nostrils daily. 16 g 6  . HYDROcodone-acetaminophen (NORCO/VICODIN) 5-325 MG per tablet Take 1 tablet by mouth every 6 (six) hours as needed. 15 tablet 0  . hydroxyurea (HYDREA) 500 MG capsule Take 1 capsule (500 mg total) by mouth 2 (two) times daily. 60 capsule 0  . Insulin Glargine (LANTUS SOLOSTAR) 100 UNIT/ML Solostar Pen Inject 10 Units into the skin 2 (two) times daily. (Patient taking differently: Inject 10-12 Units into the skin 2 (two) times daily. 12 units in the morning and 10 units in the evening) 15 mL 12  . insulin lispro (HUMALOG) 100 UNIT/ML injection Inject 4 Units into the skin 3 (three) times daily before meals.     . isosorbide mononitrate (IMDUR) 60 MG 24 hr tablet Take 60-120 mg by mouth 2 (two) times daily. 2 in the morning and 1 in the evening    . levETIRAcetam (KEPPRA) 500 MG tablet Take 1 tablet (500 mg total) by mouth 2 (two) times daily. 60 tablet  11  . Linagliptin-Metformin HCl 2.5-500 MG TABS Take 1 tablet by mouth 2 (two) times daily.    . nitroGLYCERIN (NITROSTAT) 0.4 MG SL tablet Place 1 tablet (0.4 mg total) under the tongue every 5 (five) minutes as needed for chest pain. 25 tablet 12  . PROAIR HFA 108 (90 BASE) MCG/ACT inhaler Inhale 1 puff into the lungs every 4 (four) hours as needed for wheezing or shortness of breath.     . temazepam (RESTORIL) 15 MG capsule Take 15 mg by mouth at bedtime as needed.     No current facility-administered medications on file prior to visit.  REVIEW OF SYSTEMS: See history of present illness for pertinent positives and negatives otherwise all systems are negative  PHYSICAL EXAMINATION:   Vital signs are  Filed Vitals:   02/21/15 1113  BP: 124/73  Pulse: 77  Height: 5\' 7"  (1.702 m)  Weight: 199 lb (90.266 kg)  SpO2: 98%   Body mass index is 31.16 kg/(m^2). General: The patient appears their stated age. HEENT:  No gross abnormalities Pulmonary:  Non labored breathing Musculoskeletal: There are no major deformities. Neurologic: No focal weakness or paresthesias are detected, Skin: There are no ulcer or rashes noted. Psychiatric: The patient has normal affect. Cardiovascular: Nonpalpable right pedal pulse  Diagnostic Studies none  Assessment: Peripheral vascular disease with right leg claudication Plan: I proposed a right femoral to above-knee popliteal artery bypass graft with Gore-Tex, given that his saphenous vein has been harvested for his CABG.  He has an adequate below knee popliteal artery as a target site, however the tibioperoneal trunk is diffusely diseased.  I would not want to go to his below knee popliteal artery at all possible with the tibioperoneal trunk the way it is.  Since this is for claudication proposed redoing his right above-knee bypass.  After a lengthy discussion we have decided that his symptoms are not severe enough to warrant revascularization.  He  feels he can tolerate his level of disability.  I have encouraged him to continue walking.  He will contact me if his symptoms get worse, otherwise I will see him back in a year  V. Leia Alf, M.D. [facility] Office: [phone] Pager:  [phone]  .

## 2015-02-21 NOTE — Progress Notes (Signed)
Pt's HCT today is 44.5. Does not require Phlebotomy today. Results discussed with patient. Pt instructed to keep up coming appointments and to call center for concerns. [place] given to patient. Pt verbalized understanding of instructions.

## 2015-02-22 DIAGNOSIS — H1013 Acute atopic conjunctivitis, bilateral: Secondary | ICD-10-CM | POA: Diagnosis not present

## 2015-02-23 ENCOUNTER — Encounter: Payer: Self-pay | Admitting: Neurology

## 2015-02-24 LAB — CUP PACEART REMOTE DEVICE CHECK: Date Time Interrogation Session: 20160519115639

## 2015-02-25 ENCOUNTER — Encounter: Payer: Self-pay | Admitting: Internal Medicine

## 2015-02-25 DIAGNOSIS — M25512 Pain in left shoulder: Secondary | ICD-10-CM | POA: Diagnosis not present

## 2015-02-25 DIAGNOSIS — M25511 Pain in right shoulder: Secondary | ICD-10-CM | POA: Diagnosis not present

## 2015-03-02 DIAGNOSIS — Z8673 Personal history of transient ischemic attack (TIA), and cerebral infarction without residual deficits: Secondary | ICD-10-CM | POA: Diagnosis not present

## 2015-03-02 DIAGNOSIS — D471 Chronic myeloproliferative disease: Secondary | ICD-10-CM | POA: Diagnosis not present

## 2015-03-02 DIAGNOSIS — I739 Peripheral vascular disease, unspecified: Secondary | ICD-10-CM | POA: Diagnosis not present

## 2015-03-02 DIAGNOSIS — R112 Nausea with vomiting, unspecified: Secondary | ICD-10-CM | POA: Diagnosis not present

## 2015-03-02 DIAGNOSIS — K5900 Constipation, unspecified: Secondary | ICD-10-CM | POA: Diagnosis not present

## 2015-03-02 DIAGNOSIS — Z79899 Other long term (current) drug therapy: Secondary | ICD-10-CM | POA: Diagnosis not present

## 2015-03-02 DIAGNOSIS — E1140 Type 2 diabetes mellitus with diabetic neuropathy, unspecified: Secondary | ICD-10-CM | POA: Diagnosis not present

## 2015-03-03 DIAGNOSIS — M25512 Pain in left shoulder: Secondary | ICD-10-CM | POA: Diagnosis not present

## 2015-03-03 DIAGNOSIS — M25511 Pain in right shoulder: Secondary | ICD-10-CM | POA: Diagnosis not present

## 2015-03-08 DIAGNOSIS — M25512 Pain in left shoulder: Secondary | ICD-10-CM | POA: Diagnosis not present

## 2015-03-08 DIAGNOSIS — M25511 Pain in right shoulder: Secondary | ICD-10-CM | POA: Diagnosis not present

## 2015-03-09 ENCOUNTER — Ambulatory Visit (INDEPENDENT_AMBULATORY_CARE_PROVIDER_SITE_OTHER): Payer: Medicare Other | Admitting: *Deleted

## 2015-03-09 DIAGNOSIS — I6330 Cerebral infarction due to thrombosis of unspecified cerebral artery: Secondary | ICD-10-CM

## 2015-03-11 ENCOUNTER — Encounter: Payer: Self-pay | Admitting: Neurology

## 2015-03-11 ENCOUNTER — Ambulatory Visit (INDEPENDENT_AMBULATORY_CARE_PROVIDER_SITE_OTHER): Payer: Medicare Other | Admitting: Neurology

## 2015-03-11 VITALS — BP 114/78 | HR 85 | Resp 16 | Ht 67.0 in | Wt 196.0 lb

## 2015-03-11 DIAGNOSIS — I639 Cerebral infarction, unspecified: Secondary | ICD-10-CM | POA: Diagnosis not present

## 2015-03-11 DIAGNOSIS — G40209 Localization-related (focal) (partial) symptomatic epilepsy and epileptic syndromes with complex partial seizures, not intractable, without status epilepticus: Secondary | ICD-10-CM

## 2015-03-11 DIAGNOSIS — M25512 Pain in left shoulder: Secondary | ICD-10-CM | POA: Diagnosis not present

## 2015-03-11 DIAGNOSIS — I6330 Cerebral infarction due to thrombosis of unspecified cerebral artery: Secondary | ICD-10-CM | POA: Diagnosis not present

## 2015-03-11 DIAGNOSIS — M25511 Pain in right shoulder: Secondary | ICD-10-CM | POA: Diagnosis not present

## 2015-03-11 NOTE — Progress Notes (Signed)
NEUROLOGY FOLLOW UP OFFICE NOTE  [name] [id number]  HISTORY OF PRESENT ILLNESS: I had the pleasure of seeing Tyler Whitehead in follow-up in the neurology clinic on 03/11/2015.  The patient was last seen a month ago for new symptoms of a sensation of crawling under his skin on the face. Head CT done did not show any acute changes. He is unable to do an MRI due to loop recorder. Since his last visit, he reports doing well, the crawling sensation self-resolved after 3 weeks. He denies any headaches, dizziness, diplopia, focal numbness/tingling/weakness. He denies any seizures with right visual field changes since starting Keppra. He is tolerating Keppra 535m BID without side effects.   HPI: This is a pleasant 74yo RH man with a history of hypertension, hyperlipidemia, diabetes, CAD s/p CABG and PTCA, peripheral vascular disease s/p fempop bypass, myelofibrosis/myeloproliferative disorder with polycythemia, with new onset seizure last 07/09/14. He reports that he started having headaches around a month prior, with dull pain over the left frontal region. [facility] after his Cardiology and Oncology visits, head CT normal, headache improved with narcotics. He had reported seeing spots in his vision and was scheduled to see an ophthalmologist. He had a normal exam and returned to the ER on 09/28 again for constant left frontal headaches that progressively worsened. He had some nausea and vomiting a few days prior. He was evaluated by Neurology with normal exam. ESR and CRP negative. Headache resolved with dilaudid, Ativan, Reglan, and morphine, and he was discharged home on steroid taper to prevent rebound headaches. He again returned to the ER on 09/30 with the same headache, exam non-focal. He had intermittent blurred vision but appeared chronic. He was given a cocktail again with resolution of headaches. On 07/09/14, his wife noticed that throughout the day, he kept looking to the  right side. He reported inability to see on the right side. At dinner time, he recalls sitting at the table eating, he recalls seeing lights in his right visual field and little black spots like ants, then woke up in the hospital. His wife reported that he got up, then his mouth drooped, his right hand started shaking, then he started having a GTC and fell to the floor. The seizure lasted several minutes. He was initially confused on arrival to the ER then subsequently returned to normal, with initial neurological exam showing dense right homonymous hemianopia. He denied any focal numbness, tingling, or weakness.   I personally reviewed MRI brain which showed an acute left occipital infarct. MRA showed intracranial atherosclerosis without flow-reducing lesion, unremarkable extracranial MRA. He had been taking aspirin and Plavix since stent placement a year ago, this was not changed during his hospitalization. He had a TTE and TEE which showed mild fixed plaque in the thoracic aorta. He had an implantable loop recorder placed. He does report occasional palpitations, no chest pains. Routine EEG normal. He reports that for a week after hospital discharge, he continued to see flashing lights and black spots in his right visual field, this gradually improved, and he currently denies any abnormality in his vision. He denies any further headaches.   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 Otherwise he had a normal birth and early development. There is no history of febrile convulsions, CNS infections such as meningitis/encephalitis, significant traumatic brain injury, neurosurgical procedures, or family history of seizures  PAST MEDICAL HISTORY: Past Medical History  Diagnosis Date  . Primary myelofibrosis   .  Diverticular disease   . Hemorrhoid   . CAD (coronary artery disease)     a. S/p 3V CABG 2000 with LIMA-LAD, left radial-PDA, SVG-OM1. b. 2014: stent to SVG-OM1; then found to have obstruction  distal to LAD anastamosis of LIMA s/p DES.   Marland Kitchen Peripheral vascular disease     a. R femoropopliteal bypass ~2012 in [place], reportedly shown to be occluded. Now followed by Dr. [name].  . DM (diabetes mellitus)   . Hyperlipidemia   . HTN (hypertension)   . RBBB     MEDICATIONS: Current Outpatient Prescriptions on File Prior to Visit  Medication Sig Dispense Refill  . aspirin EC 81 MG tablet Take 81 mg by mouth daily.    Marland Kitchen atorvastatin (LIPITOR) 40 MG tablet Take 1 tablet (40 mg total) by mouth daily. 90 tablet 3  . carvedilol (COREG) 6.25 MG tablet Take 1 tablet (6.25 mg total) by mouth 2 (two) times daily with a meal. 180 tablet 3  . cholecalciferol (VITAMIN D) 1000 UNITS tablet Take 1,000 Units by mouth daily.    . cilostazol (PLETAL) 50 MG tablet TAKE 1 TABLET BY MOUTH TWICE DAILY. 180 tablet 3  . clopidogrel (PLAVIX) 75 MG tablet Take 1 tablet (75 mg total) by mouth daily with breakfast. 30 tablet 11  . docusate sodium (COLACE) 100 MG capsule Take 1 capsule (100 mg total) by mouth every 12 (twelve) hours. 60 capsule 0  . fluticasone (FLONASE) 50 MCG/ACT nasal spray Place 1 spray into both nostrils daily. 16 g 6  . HYDROcodone-acetaminophen (NORCO/VICODIN) 5-325 MG per tablet Take 1 tablet by mouth every 6 (six) hours as needed. 15 tablet 0  . hydroxyurea (HYDREA) 500 MG capsule Take 1 capsule (500 mg total) by mouth 2 (two) times daily. 60 capsule 0  . Insulin Glargine (LANTUS SOLOSTAR) 100 UNIT/ML Solostar Pen Inject 10 Units into the skin 2 (two) times daily. (Patient taking differently: Inject 10-12 Units into the skin 2 (two) times daily. 12 units in the morning and 10 units in the evening) 15 mL 12  . insulin lispro (HUMALOG) 100 UNIT/ML injection Inject 4 Units into the skin 3 (three) times daily before meals.     . isosorbide mononitrate (IMDUR) 60 MG 24 hr tablet Take 60-120 mg by mouth 2 (two) times daily. 2 in the morning and 1 in the evening    . levETIRAcetam (KEPPRA) 500 MG  tablet Take 1 tablet (500 mg total) by mouth 2 (two) times daily. 60 tablet 11  . Linagliptin-Metformin HCl 2.5-500 MG TABS Take 1 tablet by mouth 2 (two) times daily.    Marland Kitchen PROAIR HFA 108 (90 BASE) MCG/ACT inhaler Inhale 1 puff into the lungs every 4 (four) hours as needed for wheezing or shortness of breath.     . temazepam (RESTORIL) 15 MG capsule Take 15 mg by mouth at bedtime as needed.    . nitroGLYCERIN (NITROSTAT) 0.4 MG SL tablet Place 1 tablet (0.4 mg total) under the tongue every 5 (five) minutes as needed for chest pain. (Patient not taking: Reported on 03/11/2015) 25 tablet 12   No current facility-administered medications on file prior to visit.    ALLERGIES: No Known Allergies  FAMILY HISTORY: Family History  Problem Relation Age of Onset  . Hyperlipidemia Maternal Grandmother   . Asthma Father   . Heart disease Father   . CAD      Multiple siblings    SOCIAL HISTORY: History   Social History  . Marital  Status: Married    Spouse Name: N/A  . Number of Children: N/A  . Years of Education: N/A   Occupational History  . Limo driver    Social History Main Topics  . Smoking status: Never Smoker   . Smokeless tobacco: Never Used  . Alcohol Use: No  . Drug Use: No  . Sexual Activity: Not on file   Other Topics Concern  . Not on file   Social History Narrative   Lives with wife.     REVIEW OF SYSTEMS: Constitutional: No fevers, chills, or sweats, no generalized fatigue, change in appetite Eyes: No visual changes, double vision, eye pain Ear, nose and throat: No hearing loss, ear pain, nasal congestion, sore throat Cardiovascular: No chest pain, palpitations Respiratory:  No shortness of breath at rest or with exertion, wheezes GastrointestinaI: No nausea, vomiting, diarrhea, abdominal pain, fecal incontinence Genitourinary:  No dysuria, urinary retention or frequency Musculoskeletal:  No neck pain, back pain. Right knee pain radiating up to right  hip Integumentary: No rash, pruritus, skin lesions Neurological: as above Psychiatric: No depression, insomnia, anxiety Endocrine: No palpitations, fatigue, diaphoresis, mood swings, change in appetite, change in weight, increased thirst Hematologic/Lymphatic:  No anemia, purpura, petechiae. Allergic/Immunologic: no itchy/runny eyes, nasal congestion, recent allergic reactions, rashes  PHYSICAL EXAM: Filed Vitals:   03/11/15 1127  BP: 114/78  Pulse: 85  Resp: 16   General: No acute distress Head:  Normocephalic/atraumatic Neck: supple, no paraspinal tenderness, full range of motion Heart:  Regular rate and rhythm Lungs:  Clear to auscultation bilaterally Back: No paraspinal tenderness Skin/Extremities: No rash, no edema Neurological Exam: alert and oriented to person, place, and time. No aphasia or dysarthria. Fund of knowledge is appropriate.  Recent and remote memory are intact.  Attention and concentration are normal.    Able to name objects and repeat phrases. Cranial nerves: Pupils equal, round, reactive to light.  Fundoscopic exam unremarkable, no papilledema. Extraocular movements intact with no nystagmus. Visual fields full. Facial sensation intact to light touch, cold, pin. No facial asymmetry. Tongue, uvula, palate midline.  Motor: Bulk and tone normal, muscle strength 5/5 throughout with no pronator drift.  Sensation to light touch intact.  No extinction to double simultaneous stimulation.  Deep tendon reflexes 2+ throughout, toes downgoing.  Finger to nose testing intact.  Gait slow and cautious due to knee pain, favors right knee. Unable to tandem walk due to pain. Romberg negative.  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 No further seizures or seizure-like symptoms since then, currently tolerating Keppra 531m BID without side effects. He had presented recently with crawling sensation on his face, head CT negative, symptoms have since resolved, neurological exam today normal. Continue aspirin and Plavix, control of other vascular risk factors, for secondary stroke prevention. He is aware of [place] driving laws to stop driving after a seizure, until 6 months seizure-free. He knows to go to the ER immediately for any change in symptoms. He will follow-up in 3 months and knows to call our office for any problems in the interim.  Thank you for allowing me to participate in his care.  Please do not hesitate to call for any questions or concerns.  The duration of this appointment visit was 15 minutes of face-to-face  time with the patient.  Greater than 50% of this time was spent in counseling, explanation of diagnosis, planning of further management, and coordination of care.   Ellouise Newer, M.D.   CC: Dr. Sheryn Bison

## 2015-03-11 NOTE — Progress Notes (Signed)
Loop recorder 

## 2015-03-11 NOTE — Patient Instructions (Signed)
1. Continue Keppra 500mg  twice a day 2. Follow-up as scheduled, call for any changes  Seizure Precautions: 1. If medication has been prescribed for you to prevent seizures, take it exactly as directed.  Do not stop taking the medicine without talking to your doctor first, even if you have not had a seizure in a long time.   2. Avoid activities in which a seizure would cause danger to yourself or to others.  Don't operate dangerous machinery, swim alone, or climb in high or dangerous places, such as on ladders, roofs, or girders.  Do not drive unless your doctor says you may.  3. If you have any warning that you may have a seizure, lay down in a safe place where you can't hurt yourself.    4.  No driving for 6 months from last seizure, as per [facility].   Please refer to the following link on the [facility] website for more information: [email or web address]   5.  Maintain good sleep hygiene.  6.  Contact your doctor if you have any problems that may be related to the medicine you are taking.  7.  Call 911 and bring the patient back to the ED if:        A.  The seizure lasts longer than 5 minutes.       B.  The patient doesn't awaken shortly after the seizure  C.  The patient has new problems such as difficulty seeing, speaking or moving  D.  The patient was injured during the seizure  E.  The patient has a temperature over 102 F (39C)  F.  The patient vomited and now is having trouble breathing

## 2015-03-15 DIAGNOSIS — E119 Type 2 diabetes mellitus without complications: Secondary | ICD-10-CM | POA: Diagnosis not present

## 2015-03-16 DIAGNOSIS — M25512 Pain in left shoulder: Secondary | ICD-10-CM | POA: Diagnosis not present

## 2015-03-16 DIAGNOSIS — M25511 Pain in right shoulder: Secondary | ICD-10-CM | POA: Diagnosis not present

## 2015-03-18 DIAGNOSIS — M25512 Pain in left shoulder: Secondary | ICD-10-CM | POA: Diagnosis not present

## 2015-03-18 DIAGNOSIS — M25511 Pain in right shoulder: Secondary | ICD-10-CM | POA: Diagnosis not present

## 2015-03-21 ENCOUNTER — Other Ambulatory Visit (HOSPITAL_BASED_OUTPATIENT_CLINIC_OR_DEPARTMENT_OTHER): Payer: Medicare Other

## 2015-03-21 ENCOUNTER — Ambulatory Visit (HOSPITAL_BASED_OUTPATIENT_CLINIC_OR_DEPARTMENT_OTHER): Payer: Medicare Other

## 2015-03-21 VITALS — BP 119/63 | HR 75 | Temp 98.4°F | Resp 18

## 2015-03-21 DIAGNOSIS — D751 Secondary polycythemia: Secondary | ICD-10-CM

## 2015-03-21 DIAGNOSIS — D7581 Myelofibrosis: Secondary | ICD-10-CM

## 2015-03-21 DIAGNOSIS — D45 Polycythemia vera: Secondary | ICD-10-CM

## 2015-03-21 LAB — CBC & DIFF AND RETIC
BASO%: 1 % (ref 0.0–2.0)
Basophils Absolute: 0.2 10*3/uL — ABNORMAL HIGH (ref 0.0–0.1)
EOS%: 3.1 % (ref 0.0–7.0)
Eosinophils Absolute: 0.7 10*3/uL — ABNORMAL HIGH (ref 0.0–0.5)
HCT: 45.9 % (ref 38.4–49.9)
HGB: 13.8 g/dL (ref 13.0–17.1)
Immature Retic Fract: 30.7 % — ABNORMAL HIGH (ref 3.00–10.60)
LYMPH%: 9.7 % — ABNORMAL LOW (ref 14.0–49.0)
MCH: 24 pg — ABNORMAL LOW (ref 27.2–33.4)
MCHC: 30.1 g/dL — ABNORMAL LOW (ref 32.0–36.0)
MCV: 80 fL (ref 79.3–98.0)
MONO#: 1.2 10*3/uL — ABNORMAL HIGH (ref 0.1–0.9)
MONO%: 5.3 % (ref 0.0–14.0)
NEUT#: 17.6 10*3/uL — ABNORMAL HIGH (ref 1.5–6.5)
NEUT%: 80.9 % — ABNORMAL HIGH (ref 39.0–75.0)
Platelets: 468 10*3/uL — ABNORMAL HIGH (ref 140–400)
RBC: 5.74 10*6/uL (ref 4.20–5.82)
RDW: 17.5 % — ABNORMAL HIGH (ref 11.0–14.6)
Retic %: 2.1 % — ABNORMAL HIGH (ref 0.80–1.80)
Retic Ct Abs: 120.54 10*3/uL — ABNORMAL HIGH (ref 34.80–93.90)
WBC: 21.8 10*3/uL — ABNORMAL HIGH (ref 4.0–10.3)
lymph#: 2.1 10*3/uL (ref 0.9–3.3)
nRBC: 0 % (ref 0–0)

## 2015-03-21 NOTE — Progress Notes (Signed)
0815: Pt has not eaten this morning, provided with soup and crackers prior to starting phlebotomy procedure.   0836: Therapeutic phlebotomy performed per order via Right AC using phlebotomy set, over approx 8 minutes. Patient tolerated procedure well.  Snacks provided. Will monitor patient 30 minutes prior to discharge.  0915: Vital signs stable. Pt discharged in no apparent distress. Son driving patient home. Ambulatory without complaints.

## 2015-03-21 NOTE — Patient Instructions (Signed)

## 2015-03-22 DIAGNOSIS — M25511 Pain in right shoulder: Secondary | ICD-10-CM | POA: Diagnosis not present

## 2015-03-22 DIAGNOSIS — M25512 Pain in left shoulder: Secondary | ICD-10-CM | POA: Diagnosis not present

## 2015-03-23 DIAGNOSIS — L02611 Cutaneous abscess of right foot: Secondary | ICD-10-CM | POA: Diagnosis not present

## 2015-03-23 DIAGNOSIS — L03031 Cellulitis of right toe: Secondary | ICD-10-CM | POA: Diagnosis not present

## 2015-03-23 LAB — CUP PACEART REMOTE DEVICE CHECK
Date Time Interrogation Session: 20160526130026
Zone Setting Detection Interval: 2000 ms
Zone Setting Detection Interval: 3000 ms
Zone Setting Detection Interval: 380 ms

## 2015-03-25 DIAGNOSIS — M25511 Pain in right shoulder: Secondary | ICD-10-CM | POA: Diagnosis not present

## 2015-03-25 DIAGNOSIS — M25512 Pain in left shoulder: Secondary | ICD-10-CM | POA: Diagnosis not present

## 2015-03-27 ENCOUNTER — Other Ambulatory Visit: Payer: Self-pay | Admitting: Hematology

## 2015-03-27 DIAGNOSIS — D7581 Myelofibrosis: Secondary | ICD-10-CM

## 2015-03-29 ENCOUNTER — Encounter: Payer: Self-pay | Admitting: Internal Medicine

## 2015-03-29 DIAGNOSIS — M25512 Pain in left shoulder: Secondary | ICD-10-CM | POA: Diagnosis not present

## 2015-03-29 DIAGNOSIS — M25511 Pain in right shoulder: Secondary | ICD-10-CM | POA: Diagnosis not present

## 2015-03-30 DIAGNOSIS — L602 Onychogryphosis: Secondary | ICD-10-CM | POA: Diagnosis not present

## 2015-03-30 DIAGNOSIS — L03119 Cellulitis of unspecified part of limb: Secondary | ICD-10-CM | POA: Diagnosis not present

## 2015-04-01 ENCOUNTER — Encounter: Payer: Self-pay | Admitting: Internal Medicine

## 2015-04-01 DIAGNOSIS — L602 Onychogryphosis: Secondary | ICD-10-CM | POA: Diagnosis not present

## 2015-04-01 DIAGNOSIS — E1151 Type 2 diabetes mellitus with diabetic peripheral angiopathy without gangrene: Secondary | ICD-10-CM | POA: Diagnosis not present

## 2015-04-01 DIAGNOSIS — M25511 Pain in right shoulder: Secondary | ICD-10-CM | POA: Diagnosis not present

## 2015-04-01 DIAGNOSIS — M25512 Pain in left shoulder: Secondary | ICD-10-CM | POA: Diagnosis not present

## 2015-04-04 DIAGNOSIS — M25511 Pain in right shoulder: Secondary | ICD-10-CM | POA: Diagnosis not present

## 2015-04-04 DIAGNOSIS — M25512 Pain in left shoulder: Secondary | ICD-10-CM | POA: Diagnosis not present

## 2015-04-08 ENCOUNTER — Ambulatory Visit (INDEPENDENT_AMBULATORY_CARE_PROVIDER_SITE_OTHER): Payer: Medicare Other | Admitting: *Deleted

## 2015-04-08 DIAGNOSIS — M25512 Pain in left shoulder: Secondary | ICD-10-CM | POA: Diagnosis not present

## 2015-04-08 DIAGNOSIS — I6330 Cerebral infarction due to thrombosis of unspecified cerebral artery: Secondary | ICD-10-CM

## 2015-04-08 DIAGNOSIS — M25511 Pain in right shoulder: Secondary | ICD-10-CM | POA: Diagnosis not present

## 2015-04-12 DIAGNOSIS — M25512 Pain in left shoulder: Secondary | ICD-10-CM | POA: Diagnosis not present

## 2015-04-12 DIAGNOSIS — M25511 Pain in right shoulder: Secondary | ICD-10-CM | POA: Diagnosis not present

## 2015-04-13 LAB — CUP PACEART REMOTE DEVICE CHECK: Date Time Interrogation Session: 20160706170331

## 2015-04-13 NOTE — Progress Notes (Signed)
Loop recorder 

## 2015-04-18 ENCOUNTER — Telehealth: Payer: Self-pay | Admitting: *Deleted

## 2015-04-18 ENCOUNTER — Other Ambulatory Visit: Payer: Medicare Other

## 2015-04-18 ENCOUNTER — Encounter: Payer: Medicare Other | Admitting: Hematology

## 2015-04-18 NOTE — Progress Notes (Signed)
No show  This encounter was created in error - please disregard.

## 2015-04-18 NOTE — Telephone Encounter (Signed)
Called pt to investigate missed appt for today but unable to leave message on ph.  Message states he was not available.

## 2015-04-19 ENCOUNTER — Other Ambulatory Visit: Payer: Self-pay | Admitting: *Deleted

## 2015-04-19 ENCOUNTER — Telehealth: Payer: Self-pay | Admitting: *Deleted

## 2015-04-19 NOTE — Telephone Encounter (Signed)
PT. MISSED HIS 04/18/15 APPOINTMENTS FOR LAB, VISIT WITH DR.FENG, AND PHLEBOTOMY. HE WOULD LIKE TO RESCHEDULE.

## 2015-04-19 NOTE — Telephone Encounter (Signed)
POF sent to scheduler for rescheduled appt 04/27/15.

## 2015-04-20 ENCOUNTER — Telehealth: Payer: Self-pay | Admitting: Hematology

## 2015-04-20 ENCOUNTER — Encounter: Payer: Self-pay | Admitting: Hematology

## 2015-04-20 NOTE — Telephone Encounter (Signed)
s.w. pt and advised on July appt.Marland KitchenMarland KitchenMarland KitchenMarland Kitchenpt ok and aware of appt

## 2015-04-25 ENCOUNTER — Telehealth: Payer: Self-pay | Admitting: *Deleted

## 2015-04-25 NOTE — Telephone Encounter (Signed)
Patient called to confirm 04-27-2015 appointments.  Asked that I notify Dr. Burr Medico he will be here.

## 2015-04-27 ENCOUNTER — Telehealth: Payer: Self-pay | Admitting: Hematology

## 2015-04-27 ENCOUNTER — Encounter: Payer: Self-pay | Admitting: Hematology

## 2015-04-27 ENCOUNTER — Ambulatory Visit (HOSPITAL_BASED_OUTPATIENT_CLINIC_OR_DEPARTMENT_OTHER): Payer: Medicare Other | Admitting: Hematology

## 2015-04-27 ENCOUNTER — Other Ambulatory Visit (HOSPITAL_BASED_OUTPATIENT_CLINIC_OR_DEPARTMENT_OTHER): Payer: Medicare Other

## 2015-04-27 VITALS — BP 151/65 | HR 72 | Temp 98.1°F | Resp 18 | Ht 67.0 in | Wt 199.4 lb

## 2015-04-27 DIAGNOSIS — I10 Essential (primary) hypertension: Secondary | ICD-10-CM

## 2015-04-27 DIAGNOSIS — R110 Nausea: Secondary | ICD-10-CM

## 2015-04-27 DIAGNOSIS — D7581 Myelofibrosis: Secondary | ICD-10-CM

## 2015-04-27 DIAGNOSIS — D45 Polycythemia vera: Secondary | ICD-10-CM

## 2015-04-27 LAB — CBC & DIFF AND RETIC
BASO%: 1.6 % (ref 0.0–2.0)
Basophils Absolute: 0.2 10*3/uL — ABNORMAL HIGH (ref 0.0–0.1)
EOS%: 3.1 % (ref 0.0–7.0)
Eosinophils Absolute: 0.5 10*3/uL (ref 0.0–0.5)
HCT: 43.1 % (ref 38.4–49.9)
HGB: 12.8 g/dL — ABNORMAL LOW (ref 13.0–17.1)
Immature Retic Fract: 22.8 % — ABNORMAL HIGH (ref 3.00–10.60)
LYMPH%: 11.4 % — ABNORMAL LOW (ref 14.0–49.0)
MCH: 23.4 pg — ABNORMAL LOW (ref 27.2–33.4)
MCHC: 29.7 g/dL — ABNORMAL LOW (ref 32.0–36.0)
MCV: 78.6 fL — ABNORMAL LOW (ref 79.3–98.0)
MONO#: 0.9 10*3/uL (ref 0.1–0.9)
MONO%: 5.9 % (ref 0.0–14.0)
NEUT#: 11.5 10*3/uL — ABNORMAL HIGH (ref 1.5–6.5)
NEUT%: 78 % — ABNORMAL HIGH (ref 39.0–75.0)
Platelets: 288 10*3/uL (ref 140–400)
RBC: 5.48 10*6/uL (ref 4.20–5.82)
RDW: 17.3 % — ABNORMAL HIGH (ref 11.0–14.6)
Retic %: 1.47 % (ref 0.80–1.80)
Retic Ct Abs: 80.56 10*3/uL (ref 34.80–93.90)
WBC: 14.7 10*3/uL — ABNORMAL HIGH (ref 4.0–10.3)
lymph#: 1.7 10*3/uL (ref 0.9–3.3)

## 2015-04-27 NOTE — Progress Notes (Signed)
[facility] HEMATOLOGY OFFICE PROGRESS NOTE  Tyler Copas, MD [phone], [address] [place] [address]  DIAGNOSIS: Myelofibrosis/Myeloproliferative disorder (Overlap). JAK2+.  CHIEF COMPLAINS: follow up   CURRENT TREATMENT:    1.Hydrea 500 mg twice daily. 2. Aspirin daily. 3. Therapeutic phlebotomy if hematocrit more than 45%    Myelofibrosis   12/26/2012 Bone Marrow Biopsy Showed hypercellularity with reticulum fibrosis, JAK-2 mutation positive, BCR/ABL negative, 46 XY in 20 metaphase, and Intermediate-1 ,DIPSS of 2. (WBC < 25; Hbg > 10;    01/02/2013 Imaging US abdomen complete revealed Mild splenogmealy (13.3 cm) and a 2.2 x 1.7 x 1.8 cm left renal cyst   01/15/2013 Pathology Results Integrated hematopathology report.  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    03/17/2013 Imaging CT Chest: Crowded peribronchial markings noted, associated with linear densities involving the left base, probably representing areas of platelike atelectasis/scarring.  No mass or infiltrate seen.    03/19/2013 - 07/20/2013 Chemotherapy Started Jakafi 20 mg bid. Dose reduced due to anemia.  Stopped due to increasing fatigue while on jakafi.    04/28/2013 - 05/01/2013 Hospital Admission Admitted to [facility] by Dr. Narda Amber, his cardiologist, due to the fact that he had midsternal chest pain.  He had a coronary stent placed this admission.    05/28/2013 - 05/29/2013 Hospital Admission Admitted to [facility] and received 3 units of blood (Dr. Oren Bracket note).    06/10/2013 Surgery Cololonoscopy due to GIB.  C/w diverticular disease and hemorrhoids but no active bleeding.    10/05/2013 -  Chemotherapy Started Hydrea 500 mg daily.   03/19/2014 -  Chemotherapy Hydrea increased to 500 mg bid wbc 17, hb 17, hct 54, plt 437    06/29/2014 Procedure cbc shows wbc 18.8 hb 18.7 hct 58.9 plt 456. ?compliance w hydrea as MCV not elevated. symptomatic start TP weekly x 4   07/28/2014 Procedure TP every 2 weeks if hct > 45, changed to monthly in April 2016     INTERVAL HISTORY:  Tyler Whitehead 74 y.o. male with myleofibrosis who returns follow up. He states his dysphagia has been stable and his swallow evaluation was normal. He has been coming for her lab every months, did get phlebotomy last months. He is doing well overall. He denies any fever, night sweats, abdominal distention or discomfort. He is taking Hydrea daily.  MEDICAL HISTORY: Past Medical History  Diagnosis Date  . Primary myelofibrosis   . Diverticular disease   . Hemorrhoid   . CAD (coronary artery disease)     a. S/p 3V CABG 2000 with LIMA-LAD, left radial-PDA, SVG-OM1. b. 2014: stent to SVG-OM1; then found to have obstruction distal to LAD anastamosis of LIMA s/p DES.   Marland Kitchen Peripheral vascular disease     a. R femoropopliteal bypass ~2012 in [place], reportedly shown to be occluded. Now followed by Dr. Allyson Sabal.  . DM (diabetes mellitus)   . Hyperlipidemia   . HTN (hypertension)   . RBBB      ALLERGIES:  has No Known Allergies.  MEDICATIONS: has a current medication list which includes the following prescription(s): aspirin ec, atorvastatin, atorvastatin, carvedilol, cholecalciferol, cilostazol, clopidogrel, docusate sodium, fluticasone, hydrocodone-acetaminophen, hydroxyurea, insulin glargine, insulin lispro, isosorbide mononitrate, levetiracetam, linagliptin-metformin hcl, nitroglycerin, proair hfa, and temazepam.  SURGICAL HISTORY:  Past Surgical History  Procedure Laterality Date  . Coronary artery bypass graft  2000  LM occluded, LIMA to LAD patent but distal native LAD disease, saphenous vein bypass graft to OM with 95% stenosis in the midportion, right coronary artery occluded, radial artery graft to right coronary artery patent but was diffusely  small. Marginal graft stented. 7 2014. Of note he had multiple previous stents in the right coronary artery. The distal LAD was treated with angioplasty. I  . Cardiac catheterization  May 2014  . Percutaneous coronary stent intervention (pci-s)  April 27 2013  . Vein surgery    . Tee without cardioversion N/A 07/12/2014    Procedure: TRANSESOPHAGEAL ECHOCARDIOGRAM (TEE);  Surgeon: Fay Records, MD;  Location: [facility] ENDOSCOPY;  Service: Cardiovascular;  Laterality: N/A;  . Loop recorder implant N/A 07/12/2014    Procedure: LOOP RECORDER IMPLANT;  Surgeon: Evans Lance, MD;  Location: [facility] CATH LAB;  Service: Cardiovascular;  Laterality: N/A;  . Abdominal aortagram N/A 09/21/2014    Procedure: ABDOMINAL Maxcine Ham;  Surgeon: Elam Dutch, MD;  Location: [facility] CATH LAB;  Service: Cardiovascular;  Laterality: N/A;  . Lower extremity angiogram  09/21/2014    Procedure: LOWER EXTREMITY ANGIOGRAM;  Surgeon: Elam Dutch, MD;  Location: [facility] CATH LAB;  Service: Cardiovascular;;  . Abdominal angiogram N/A 01/18/2015    Procedure: ABDOMINAL ANGIOGRAM;  Surgeon: Serafina Mitchell, MD;  Location: [facility] CATH LAB;  Service: Cardiovascular;  Laterality: N/A;     Medication List       This list is accurate as of: 04/27/15  9:58 AM.  Always use your most recent med list.               aspirin EC 81 MG tablet  Take 81 mg by mouth daily.     atorvastatin 20 MG tablet  Commonly known as:  LIPITOR  Take 20 mg by mouth daily. Takes along with a 40 mg tablet daily     atorvastatin 40 MG tablet  Commonly known as:  LIPITOR  Take 1 tablet (40 mg total) by mouth daily.     carvedilol 6.25 MG tablet  Commonly known as:  COREG  Take 1 tablet (6.25 mg total) by mouth 2 (two) times daily with a meal.     cholecalciferol 1000 UNITS tablet  Commonly known as:  VITAMIN D  Take 1,000 Units by mouth daily.     cilostazol 50 MG tablet  Commonly known as:  PLETAL  TAKE 1 TABLET BY MOUTH TWICE DAILY.     clopidogrel  75 MG tablet  Commonly known as:  PLAVIX  Take 1 tablet (75 mg total) by mouth daily with breakfast.     docusate sodium 100 MG capsule  Commonly known as:  COLACE  Take 1 capsule (100 mg total) by mouth every 12 (twelve) hours.     fluticasone 50 MCG/ACT nasal spray  Commonly known as:  FLONASE  Place 1 spray into both nostrils daily.     HYDROcodone-acetaminophen 5-325 MG per tablet  Commonly known as:  NORCO/VICODIN  Take 1 tablet by mouth every 6 (six) hours as needed.     hydroxyurea 500 MG capsule  Commonly known as:  HYDREA  TAKE ONE CAPSULE BY MOUTH TWICE DAILY     Insulin Glargine 100 UNIT/ML Solostar Pen  Commonly known as:  LANTUS SOLOSTAR  Inject 10 Units into the skin 2 (two) times daily.     insulin lispro 100 UNIT/ML injection  Commonly known as:  HUMALOG  Inject 4 Units into the skin 3 (three) times daily  before meals.     isosorbide mononitrate 60 MG 24 hr tablet  Commonly known as:  IMDUR  Take 60-120 mg by mouth 2 (two) times daily. 2 in the morning and 1 in the evening     levETIRAcetam 500 MG tablet  Commonly known as:  KEPPRA  Take 1 tablet (500 mg total) by mouth 2 (two) times daily.     Linagliptin-Metformin HCl 2.5-500 MG Tabs  Take 1 tablet by mouth 2 (two) times daily.     nitroGLYCERIN 0.4 MG SL tablet  Commonly known as:  NITROSTAT  Place 1 tablet (0.4 mg total) under the tongue every 5 (five) minutes as needed for chest pain.     PROAIR HFA 108 (90 BASE) MCG/ACT inhaler  Generic drug:  albuterol  Inhale 1 puff into the lungs every 4 (four) hours as needed for wheezing or shortness of breath.     temazepam 15 MG capsule  Commonly known as:  RESTORIL  Take 15 mg by mouth at bedtime as needed.        REVIEW OF SYSTEMS:   Constitutional: Denies fevers, chills or abnormal weight loss, (+) fatigue  Eyes: Admits to blurriness of vision and floaters Ears, nose, mouth, throat, and face: Denies mucositis or sore throat Respiratory: Denies  cough, dyspnea or wheezes Cardiovascular: Denies palpitation, chest discomfort or lower extremity swelling Gastrointestinal:  Denies nausea, heartburn or change in bowel habits Skin: Denies abnormal skin rashes Lymphatics: Denies new lymphadenopathy or easy bruising Neurological:Denies numbness, tingling or new weaknesses Behavioral/Psych: Mood is stable, no new changes  All other systems were reviewed with the patient and are negative.  PHYSICAL EXAMINATION: ECOG PERFORMANCE STATUS: 1  Blood pressure 151/65, pulse 72, temperature 98.1 F (36.7 C), temperature source Oral, resp. rate 18, height _0  (1.702 m), weight 199 lb 6.4 oz (90.447 kg), SpO2 100 %.  GENERAL:alert, no distress and comfortable; elderly male who appears his stated age.  SKIN: skin color, texture, turgor are normal, no rashes or significant lesions EYES: normal, Conjunctiva are pink and non-injected, sclera clear OROPHARYNX:no exudate, no erythema and lips, buccal mucosa, and tongue normal  NECK: supple, thyroid normal size, non-tender, without nodularity LYMPH:  no palpable lymphadenopathy in the cervical, axillary or supraclavicular LUNGS: clear to auscultation with normal breathing effort, no wheezes or rhonchi HEART: regular rate & rhythm and no murmurs and no lower extremity edema ABDOMEN:abdomen soft, non-tender and normal bowel sounds Musculoskeletal:no cyanosis of digits and no clubbing  NEURO: alert & oriented x 3 with fluent speech, no focal motor/sensory deficits  Labs:  CBC Latest Ref Rng 04/27/2015 03/21/2015 02/21/2015  WBC 4.0 - 10.3 10e3/uL 14.7(H) 21.8(H) 14.7(H)  Hemoglobin 13.0 - 17.1 g/dL 12.8(L) 13.8 13.3  Hematocrit 38.4 - 49.9 % 43.1 45.9 44.5  Platelets 140 - 400 10e3/uL 288 468(H) 370    CMP Latest Ref Rng 02/16/2015 01/18/2015 12/22/2014  Glucose 70 - 99 mg/dL 159(H) 148(H) 137  BUN 6 - 20 mg/dL 9 13 12.2  Creatinine 0.61 - 1.24 mg/dL 0.93 1.10 1.0  Sodium 135 - 145 mmol/L 140 142 144   Potassium 3.5 - 5.1 mmol/L 4.1 4.0 4.4  Chloride 101 - 111 mmol/L 105 102 -  CO2 22 - 32 mmol/L 28 - 28  Calcium 8.9 - 10.3 mg/dL 9.1 - 9.4  Total Protein 6.4 - 8.3 g/dL - - 6.2(L)  Total Bilirubin 0.20 - 1.20 mg/dL - - 0.65  Alkaline Phos 40 - 150 U/L - - 86  AST 5 - 34 U/L - - 15  ALT 0 - 55 U/L - - 21     RADIOGRAPHIC STUDIES: CT abdomen and pelvis with contrast on 12/14/2014  IMPRESSION: 1. No acute findings within the abdomen or pelvis. 2. Atherosclerotic disease. 3. Kidney cysts.  ASSESSMENT: Tyler Whitehead 74 y.o. male with a history of Myelofibrosis, jak 2 mutation and symptomatic polycythemia. Wbc and platelets are high as well.   PLAN:   1. Primary Myeloproliferative neoplasm (Myelofibrosis).  -We discussed that this is not curable disease, but treatable. Some patient will develop acute leukemia or aplastic anemia later on. -he is not a good candidate for stem cell transplant due to his advanced age and multiple medical comorbidities. -I reviewed his CT of abdomen, which showed normal liver and spleen. -I discussed that he is at high risk for thrombosis secondary to his disease. -We again emphasized the importance of continued Hydrea and normalized his blood counts to prevent further stroke or thrombosis. He agrees to continue Hydrea and compliant with her treatment. -His hematocrit was 43% today, no phlebotomy today -He is clinically stable, dysphagia and fatigue has improved.  -check CBC every 6 weeks and phlebotomy if HCT>=45%  2. Indigestion, nausea, dysphagia and anorexia  -Improved. He recently had a barium swallow evaluation which was normal  -Follow up with primary care physician  3. CADz s/p CABG x 3, hypertension  --Continue lisinopril, coreg, aspirin   4. PvDz.  --Continue Imdur as it appearing to have improved some of his claudication symptoms.   5. Dyslipidemia.  --Continue statin therapy.   6. Stroke and seizure in 07/2014 -- follow up with his  neurologist   Plan; - continue Hydrea at same dose  -CBC every 6 weeks and phlebotomy if HCT>=45% -Return to clinic in 3 months.    All questions were answered. The patient knows to call the clinic with any problems, questions or concerns. We can certainly see the patient much sooner if necessary.  I spent 20 minutes counseling the patient face to face. The total time spent in the appointment was 25 minutes.  Truitt Merle 04/27/2015

## 2015-04-27 NOTE — Telephone Encounter (Signed)
per pof top sch pt appt-gave pt copy of avs-sent MW emailt o sch phlebotomy

## 2015-04-28 ENCOUNTER — Telehealth: Payer: Self-pay | Admitting: *Deleted

## 2015-04-28 NOTE — Telephone Encounter (Signed)
Per staff message and POF I have scheduled appts. Advised scheduler of appts. JMW  

## 2015-04-29 ENCOUNTER — Ambulatory Visit (INDEPENDENT_AMBULATORY_CARE_PROVIDER_SITE_OTHER): Payer: Medicare Other | Admitting: Cardiology

## 2015-04-29 ENCOUNTER — Encounter: Payer: Self-pay | Admitting: Cardiology

## 2015-04-29 VITALS — BP 118/70 | HR 61 | Ht 67.0 in | Wt 198.8 lb

## 2015-04-29 DIAGNOSIS — I2510 Atherosclerotic heart disease of native coronary artery without angina pectoris: Secondary | ICD-10-CM | POA: Diagnosis not present

## 2015-04-29 DIAGNOSIS — I10 Essential (primary) hypertension: Secondary | ICD-10-CM

## 2015-04-29 DIAGNOSIS — I6330 Cerebral infarction due to thrombosis of unspecified cerebral artery: Secondary | ICD-10-CM

## 2015-04-29 NOTE — Patient Instructions (Signed)
Your physician wants you to follow-up in: 1 Year. You will receive a reminder letter in the mail two months in advance. If you don't receive a letter, please call our office to schedule the follow-up appointment.  

## 2015-04-29 NOTE — Progress Notes (Signed)
HPI The patient presents for evaluation of CAD he has had CABG and PVD. His last cath in [place], [place] suggested that "Medical therapy should be enhanced if the patient has ongoing exertional chest discomfort despite maximal medical management and if he has evidence of inferior ischemia on a perfusion study then repeat PCI of the native right coronary artery could be considered. However, given the patient's track record the right coronary artery is not likely to remain open even if PCI could be accomplished."  He is also managed for lower extremity disease as seen by Dr. Trula Slade.  He has had a CVA of unclear etiology had an implantable loop by Dr. Lovena Le.  Since I last saw him he's had no new cardiovascular complaints. He's not require any nitroglycerin. He continues to have leg pain is his predominant complaint. He wasn't having any relief with Pletal so we stopped this. He somewhat limited in his activities because of this but he denies any chest pressure, neck or arm discomfort. He's had no shortness of breath, PND or orthopnea. He's had no weight gain or edema.  No Known Allergies  Current Outpatient Prescriptions  Medication Sig Dispense Refill  . aspirin EC 81 MG tablet Take 81 mg by mouth daily.    Marland Kitchen atorvastatin (LIPITOR) 20 MG tablet Take 20 mg by mouth daily. Takes along with a 40 mg tablet daily    . atorvastatin (LIPITOR) 40 MG tablet Take 1 tablet (40 mg total) by mouth daily. 90 tablet 3  . carvedilol (COREG) 6.25 MG tablet Take 1 tablet (6.25 mg total) by mouth 2 (two) times daily with a meal. 180 tablet 3  . cholecalciferol (VITAMIN D) 1000 UNITS tablet Take 1,000 Units by mouth daily.    . cilostazol (PLETAL) 50 MG tablet TAKE 1 TABLET BY MOUTH TWICE DAILY. 180 tablet 3  . clopidogrel (PLAVIX) 75 MG tablet Take 1 tablet (75 mg total) by mouth daily with breakfast. 30 tablet 11  . fluticasone (FLONASE) 50 MCG/ACT nasal spray Place 1 spray into both nostrils daily. 16 g 6  .  HYDROcodone-acetaminophen (NORCO/VICODIN) 5-325 MG per tablet Take 1 tablet by mouth every 6 (six) hours as needed. 15 tablet 0  . hydroxyurea (HYDREA) 500 MG capsule TAKE ONE CAPSULE BY MOUTH TWICE DAILY 60 capsule 1  . Insulin Glargine (LANTUS SOLOSTAR) 100 UNIT/ML Solostar Pen Inject 10 Units into the skin 2 (two) times daily. (Patient taking differently: Inject 10-12 Units into the skin 2 (two) times daily. 12 units in the morning and 10 units in the evening) 15 mL 12  . insulin lispro (HUMALOG) 100 UNIT/ML injection Inject 4 Units into the skin 3 (three) times daily before meals.     . isosorbide mononitrate (IMDUR) 60 MG 24 hr tablet Take 60-120 mg by mouth 2 (two) times daily. 2 in the morning and 1 in the evening    . levETIRAcetam (KEPPRA) 500 MG tablet Take 1 tablet (500 mg total) by mouth 2 (two) times daily. 60 tablet 11  . Linagliptin-Metformin HCl 2.5-500 MG TABS Take 1 tablet by mouth 2 (two) times daily.    . nitroGLYCERIN (NITROSTAT) 0.4 MG SL tablet Place 1 tablet (0.4 mg total) under the tongue every 5 (five) minutes as needed for chest pain. 25 tablet 12  . PROAIR HFA 108 (90 BASE) MCG/ACT inhaler Inhale 1 puff into the lungs every 4 (four) hours as needed for wheezing or shortness of breath.      No current facility-administered  medications for this visit.    Past Medical History  Diagnosis Date  . Primary myelofibrosis   . Diverticular disease   . Hemorrhoid   . CAD (coronary artery disease)     a. S/p 3V CABG 2000 with LIMA-LAD, left radial-PDA, SVG-OM1. b. 2014: stent to SVG-OM1; then found to have obstruction distal to LAD anastamosis of LIMA s/p DES.   Marland Kitchen Peripheral vascular disease     a. R femoropopliteal bypass ~2012 in [place], reportedly shown to be occluded. Now followed by Dr. [name].  . DM (diabetes mellitus)   . Hyperlipidemia   . HTN (hypertension)   . RBBB     Past Surgical History  Procedure Laterality Date  . Coronary artery bypass graft  2000    LM  occluded, LIMA to LAD patent but distal native LAD disease, saphenous vein bypass graft to OM with 95% stenosis in the midportion, right coronary artery occluded, radial artery graft to right coronary artery patent but was diffusely small. Marginal graft stented. 7 2014. Of note he had multiple previous stents in the right coronary artery. The distal LAD was treated with angioplasty. I  . Cardiac catheterization  May 2014  . Percutaneous coronary stent intervention (pci-s)  April 27 2013  . Vein surgery    . Tee without cardioversion N/A 07/12/2014    Procedure: TRANSESOPHAGEAL ECHOCARDIOGRAM (TEE);  Surgeon: Fay Records, MD;  Location: [facility] ENDOSCOPY;  Service: Cardiovascular;  Laterality: N/A;  . Loop recorder implant N/A 07/12/2014    Procedure: LOOP RECORDER IMPLANT;  Surgeon: Evans Lance, MD;  Location[facility] CATH LAB;  Service: Cardiovascular;  Laterality: N/A;  . Abdominal aortagram N/A 09/21/2014    Procedure: ABDOMINAL Maxcine Ham;  Surgeon: Elam Dutch, MD;  Location: [facility] CATH LAB;  Service: Cardiovascular;  Laterality: N/A;  . Lower extremity angiogram  09/21/2014    Procedure: LOWER EXTREMITY ANGIOGRAM;  Surgeon: Elam Dutch, MD;  Location: [facility] CATH LAB;  Service: Cardiovascular;;  . Abdominal angiogram N/A 01/18/2015    Procedure: ABDOMINAL ANGIOGRAM;  Surgeon: Serafina Mitchell, MD;  Location: [facility] CATH LAB;  Service: Cardiovascular;  Laterality: N/A;    ROS:  As stated in the HPI and negative for all other systems.  PHYSICAL EXAM BP 118/70 mmHg  Pulse 61  Ht 5\' 7"  (1.702 m)  Wt 198 lb 12.8 oz (90.175 kg)  BMI 31.13 kg/m2 GENERAL:  Well appearing NECK:  No jugular venous distention, waveform within normal limits, carotid upstroke brisk and symmetric, no bruits, no thyromegaly LUNGS:  Clear to auscultation bilaterally BACK:  No CVA tenderness CHEST:  Well healed sternotomy scar. HEART:  PMI not displaced or sustained,S1 and S2 within normal limits, no S3, no S4, no clicks, no  rubs, no murmurs ABD:  Flat, positive bowel sounds normal in frequency in pitch, no bruits, no rebound, no guarding, no midline pulsatile mass, no hepatomegaly, no splenomegaly EXT:  2 plus pulses upper, absent left radial, absent femoral, DP/PT, no edema, no cyanosis no clubbing SKIN:  No rashes no nodules    ASSESSMENT AND PLAN  CAD:  He has no active chest pain.  He will continue with risk reduction. No further imaging is indicated.  HTN:  The blood pressure is at target. No change in medications is indicated. We will continue with therapeutic lifestyle changes (TLC).  DM:   Per [name], MD    PVD:  Per Dr. Trula Slade.    HYPERLIPIDEMIA:  His LDL was 40 in October.  He will continue with current meds.     CVA:  I did review his event implantable monitor. He's had no dysrhythmias. He has follow-up in the office planned for October.

## 2015-05-09 ENCOUNTER — Ambulatory Visit (INDEPENDENT_AMBULATORY_CARE_PROVIDER_SITE_OTHER): Payer: Medicare Other | Admitting: *Deleted

## 2015-05-09 DIAGNOSIS — I6330 Cerebral infarction due to thrombosis of unspecified cerebral artery: Secondary | ICD-10-CM | POA: Diagnosis not present

## 2015-05-10 ENCOUNTER — Encounter: Payer: Medicare Other | Attending: Family Medicine | Admitting: Dietician

## 2015-05-10 VITALS — Wt 195.0 lb

## 2015-05-10 DIAGNOSIS — E1165 Type 2 diabetes mellitus with hyperglycemia: Secondary | ICD-10-CM | POA: Diagnosis not present

## 2015-05-10 DIAGNOSIS — Z794 Long term (current) use of insulin: Secondary | ICD-10-CM | POA: Diagnosis not present

## 2015-05-10 DIAGNOSIS — Z713 Dietary counseling and surveillance: Secondary | ICD-10-CM | POA: Insufficient documentation

## 2015-05-10 NOTE — Progress Notes (Signed)
Medical Nutrition Therapy:  Appt start time: 1200 end time:  1215.   Assessment:  11/02/14 Primary concerns today: Improvement of blood sugar control.  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  Patient with PVD and vitamin D deficiency, chronic constipation.  Sees oncologist for polycythemia.  Hx of stroke in October. Patient lives with wife.  Wife and patient shop and cook.  Wife smokes.  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  Now with new medical insurance and stressed about the bills.  Several deaths in family within last month.  Travels a lot.  HgbA1C decreased from 12.9% (07/2014) to 8.4% (10/18/2014).   02/07/15: Patient is here for alone for follow up.  States that he has had some increased stress related to above and has received counseling.  He has been walking a little more with the better weather.  Enjoys to travel and enjoys being near water.  Has considered the [employer] program but not yet initiated.  Per diet hx, intake is lower fat.  No new HgbA1C known.  CBG's in the am are usually 110-135.  Patient complains of indigestion at times.  05/10/15: Patient reports that he just got his HgbA1C results but does not know what it is and states that he needs to get this rechecked so unsure the last date.  His weight is down 3 lbs in the past 3 months.  Patient states that he would like to continue to lose weight.  He has been able to continue to exercise but has not yet initiated the [employer] program.  His blood sugar was 180 this am (at dinner at 11 pm last night).  Usually it is 145-150.  He reports that he got his eyes checked and they have no problems.  He has no questions at this time.  Preferred Learning Style:   No preference indicated   Learning Readiness:   Ready  MEDICATIONS: see list   DIETARY INTAKE: Usual eating pattern includes 2-3  meals and 1 snacks per day.  24-hr recall:  B ( AM): frosted mini wheats, whole milk,  coffee with sugar sub,  Juice OR eggs and toast or applesauce on toast Snk (AM):  L ( PM): tuna sandwich or peanut butter and jelly sandwich Snk ( PM): applesauce, crackers and cheese D ( PM): meat, baked potato, vegetables or soup Snk ( PM): occasional nuts Beverages: water, 4 oz juice in am, rare beer, coffee with sugar sub or tea with sugar sub  Usual physical activity: walks 1 mile 1-2 x per day.  Estimated energy needs: 1800 calories 200 g carbohydrates 113 g protein 60 g fat  Progress Towards Goal(s):  In progress.   Nutritional Diagnosis:  NB-1.1 Food and nutrition-related knowledge deficit As related to the balance of carbohydrates, protein, and fat.  As evidenced by diet hx and patient report.    Intervention:  Nutrition counseling.  Continued education on decreasing fat and sugar content as well as including a protein with meals.  Keep up the exercise!  Consider the [employer] program at the [facility]. Have protein in moderation with all meals and snacks. Note what your HgbA1C is.  Is it less than 8.4%? Teaching Method Utilized:  Visual Auditory Hands on  Handouts given during visit include:  My Plate  [place].  Barriers to learning/adherence to lifestyle change: patient forgets medicine at times- on this visit, states that he  is remembering meds at this time.- he states that he has his meds on the dining room table.    Demonstrated degree of understanding via:  Teach Back   Monitoring/Evaluation:  Dietary intake, exercise, label reading, weight in one year per patient preference.

## 2015-05-10 NOTE — Patient Instructions (Signed)
Keep up the exercise!  Consider the [employer] program at the [facility]. Have protein in moderation with all meals and snacks. Note what your HgbA1C is.  Is it less than 8.4%?

## 2015-05-11 NOTE — Progress Notes (Signed)
Loop recorder 

## 2015-05-12 ENCOUNTER — Encounter: Payer: Self-pay | Admitting: Internal Medicine

## 2015-05-23 LAB — CUP PACEART REMOTE DEVICE CHECK: Date Time Interrogation Session: 20160815083311

## 2015-05-31 DIAGNOSIS — Z79899 Other long term (current) drug therapy: Secondary | ICD-10-CM | POA: Diagnosis not present

## 2015-05-31 DIAGNOSIS — E1140 Type 2 diabetes mellitus with diabetic neuropathy, unspecified: Secondary | ICD-10-CM | POA: Diagnosis not present

## 2015-05-31 DIAGNOSIS — Z794 Long term (current) use of insulin: Secondary | ICD-10-CM | POA: Diagnosis not present

## 2015-06-01 DIAGNOSIS — R109 Unspecified abdominal pain: Secondary | ICD-10-CM | POA: Diagnosis not present

## 2015-06-01 DIAGNOSIS — F329 Major depressive disorder, single episode, unspecified: Secondary | ICD-10-CM | POA: Diagnosis not present

## 2015-06-01 DIAGNOSIS — Z794 Long term (current) use of insulin: Secondary | ICD-10-CM | POA: Diagnosis not present

## 2015-06-01 DIAGNOSIS — E1140 Type 2 diabetes mellitus with diabetic neuropathy, unspecified: Secondary | ICD-10-CM | POA: Diagnosis not present

## 2015-06-07 ENCOUNTER — Ambulatory Visit (INDEPENDENT_AMBULATORY_CARE_PROVIDER_SITE_OTHER): Payer: Medicare Other | Admitting: *Deleted

## 2015-06-07 DIAGNOSIS — I6330 Cerebral infarction due to thrombosis of unspecified cerebral artery: Secondary | ICD-10-CM

## 2015-06-07 LAB — CUP PACEART REMOTE DEVICE CHECK: Date Time Interrogation Session: 20160909132635

## 2015-06-08 ENCOUNTER — Ambulatory Visit (HOSPITAL_BASED_OUTPATIENT_CLINIC_OR_DEPARTMENT_OTHER): Payer: Medicare Other

## 2015-06-08 ENCOUNTER — Encounter: Payer: Self-pay | Admitting: Internal Medicine

## 2015-06-08 ENCOUNTER — Other Ambulatory Visit (HOSPITAL_BASED_OUTPATIENT_CLINIC_OR_DEPARTMENT_OTHER): Payer: Medicare Other

## 2015-06-08 VITALS — BP 121/71 | HR 80 | Temp 99.2°F | Resp 20

## 2015-06-08 DIAGNOSIS — D751 Secondary polycythemia: Secondary | ICD-10-CM | POA: Diagnosis present

## 2015-06-08 DIAGNOSIS — D45 Polycythemia vera: Secondary | ICD-10-CM

## 2015-06-08 DIAGNOSIS — D7581 Myelofibrosis: Secondary | ICD-10-CM

## 2015-06-08 LAB — CBC & DIFF AND RETIC
BASO%: 1.5 % (ref 0.0–2.0)
Basophils Absolute: 0.2 10*3/uL — ABNORMAL HIGH (ref 0.0–0.1)
EOS%: 4 % (ref 0.0–7.0)
Eosinophils Absolute: 0.7 10*3/uL — ABNORMAL HIGH (ref 0.0–0.5)
HCT: 46.3 % (ref 38.4–49.9)
HGB: 13.7 g/dL (ref 13.0–17.1)
Immature Retic Fract: 27.1 % — ABNORMAL HIGH (ref 3.00–10.60)
LYMPH%: 12.6 % — ABNORMAL LOW (ref 14.0–49.0)
MCH: 22.5 pg — ABNORMAL LOW (ref 27.2–33.4)
MCHC: 29.6 g/dL — ABNORMAL LOW (ref 32.0–36.0)
MCV: 76 fL — ABNORMAL LOW (ref 79.3–98.0)
MONO#: 0.8 10*3/uL (ref 0.1–0.9)
MONO%: 5.2 % (ref 0.0–14.0)
NEUT#: 12.4 10*3/uL — ABNORMAL HIGH (ref 1.5–6.5)
NEUT%: 76.7 % — ABNORMAL HIGH (ref 39.0–75.0)
Platelets: 586 10*3/uL — ABNORMAL HIGH (ref 140–400)
RBC: 6.09 10*6/uL — ABNORMAL HIGH (ref 4.20–5.82)
RDW: 17.1 % — ABNORMAL HIGH (ref 11.0–14.6)
Retic %: 1.72 % (ref 0.80–1.80)
Retic Ct Abs: 104.75 10*3/uL — ABNORMAL HIGH (ref 34.80–93.90)
WBC: 16.2 10*3/uL — ABNORMAL HIGH (ref 4.0–10.3)
lymph#: 2 10*3/uL (ref 0.9–3.3)

## 2015-06-08 NOTE — Progress Notes (Signed)
Loop recorder 

## 2015-06-08 NOTE — Patient Instructions (Signed)

## 2015-06-08 NOTE — Progress Notes (Signed)
Phlebotomy. 500 ml of blood obtained from patient with 18g PIV to Left AC. Patient given snack and encouraged fluids. Observed for 30 minutes afterwards. Vital Signs stable.

## 2015-06-11 MED ORDER — PROCHLORPERAZINE EDISYLATE 5 MG/ML IJ SOLN
INTRAMUSCULAR | Status: AC
  Filled 2015-06-11: qty 2

## 2015-06-11 MED ORDER — PROCHLORPERAZINE MALEATE 10 MG PO TABS
ORAL_TABLET | ORAL | Status: AC
  Filled 2015-06-11: qty 1

## 2015-06-17 NOTE — Progress Notes (Signed)
Carelink summary report received. Battery status OK. Normal device function. No new symptom episodes, tachy episodes, brady, or pause episodes. No new AF episodes. Monthly summary reports and ROV with GT on 07/20/2015 at 11:30am.

## 2015-06-21 DIAGNOSIS — L602 Onychogryphosis: Secondary | ICD-10-CM | POA: Diagnosis not present

## 2015-06-21 DIAGNOSIS — E1151 Type 2 diabetes mellitus with diabetic peripheral angiopathy without gangrene: Secondary | ICD-10-CM | POA: Diagnosis not present

## 2015-07-06 ENCOUNTER — Encounter: Payer: Self-pay | Admitting: Neurology

## 2015-07-06 ENCOUNTER — Ambulatory Visit (INDEPENDENT_AMBULATORY_CARE_PROVIDER_SITE_OTHER): Payer: Medicare Other | Admitting: Neurology

## 2015-07-06 VITALS — BP 110/82 | HR 85 | Resp 16 | Ht 67.0 in | Wt 197.0 lb

## 2015-07-06 DIAGNOSIS — G40209 Localization-related (focal) (partial) symptomatic epilepsy and epileptic syndromes with complex partial seizures, not intractable, without status epilepticus: Secondary | ICD-10-CM | POA: Diagnosis not present

## 2015-07-06 DIAGNOSIS — H539 Unspecified visual disturbance: Secondary | ICD-10-CM

## 2015-07-06 DIAGNOSIS — Z8673 Personal history of transient ischemic attack (TIA), and cerebral infarction without residual deficits: Secondary | ICD-10-CM | POA: Diagnosis not present

## 2015-07-06 MED ORDER — LEVETIRACETAM 500 MG PO TABS: ORAL_TABLET | ORAL | Status: DC

## 2015-07-06 NOTE — Progress Notes (Signed)
NEUROLOGY FOLLOW UP OFFICE NOTE  MYRAN ARCIA [id number]  HISTORY OF PRESENT ILLNESS: I had the pleasure of seeing Lorne Winkels in follow-up in the neurology clinic on 07/06/2015.  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 In April, he was reporting a sensation of something crawling under his forehead, head CT unremarkable, unable to do MRI due to loop recorder. These feelings self-resolved after 3 weeks. He presents today reporting that for the past 2 weeks, he has been having intermittent visual changes over the right visual field where he sees a black spot like a butterfly or bat, seen even when covering each eye. At times it got to the center of his vision, but is mostly on the right hemi-field and when he looks to the right. He has also noticed that at night, he sees a reflection of a light even when it is dark. He is having the right visual symptoms in the office today, denies any headaches, confusion, speech difficulties, weakness. He is taking Keppra 524m BID with no side effects.   HPI: This is a pleasant 74yo RH man with a history of hypertension, hyperlipidemia, diabetes, CAD s/p CABG and PTCA, peripheral vascular disease s/p fempop bypass, myelofibrosis/myeloproliferative disorder with polycythemia, with new onset seizure last 07/09/14. He reports that he started having headaches around a month prior, with dull pain over the left frontal region. [facility] after his Cardiology and Oncology visits, head CT normal, headache improved with narcotics. He had reported seeing spots in his vision and was scheduled to see an ophthalmologist. He had a normal exam and returned to the ER on 09/28 again for constant left frontal headaches that progressively worsened. He had some nausea and vomiting a few days prior. He  was evaluated by Neurology with normal exam. ESR and CRP negative. Headache resolved with dilaudid, Ativan, Reglan, and morphine, and he was discharged home on steroid taper to prevent rebound headaches. He again returned to the ER on 09/30 with the same headache, exam non-focal. He had intermittent blurred vision but appeared chronic. He was given a cocktail again with resolution of headaches. On 07/09/14, his wife noticed that throughout the day, he kept looking to the right side. He reported inability to see on the right side. At dinner time, he recalls sitting at the table eating, he recalls seeing lights in his right visual field and little black spots like ants, then woke up in the hospital. His wife reported that he got up, then his mouth drooped, his right hand started shaking, then he started having a GTC and fell to the floor. The seizure lasted several minutes. He was initially confused on arrival to the ER then subsequently returned to normal, with initial neurological exam showing dense right homonymous hemianopia. He denied any focal numbness, tingling, or weakness.   I personally reviewed MRI brain which showed an acute left occipital infarct. MRA showed intracranial atherosclerosis without flow-reducing lesion, unremarkable extracranial MRA. He had been taking aspirin and Plavix since stent placement a year ago, this was not changed during his hospitalization. He had a TTE and TEE which showed mild fixed plaque in the thoracic aorta. He had an implantable loop recorder placed. He does report occasional palpitations, no chest pains. Routine EEG normal. He reports that for a week  after hospital discharge, he continued to see flashing lights and black spots in his right visual field, this gradually improved, and he currently denies any abnormality in his vision. He denies any further headaches.   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 Otherwise he had a normal birth and early  development. There is no history of febrile convulsions, CNS infections such as meningitis/encephalitis, significant traumatic brain injury, neurosurgical procedures, or family history of seizures  PAST MEDICAL HISTORY: Past Medical History  Diagnosis Date  . Primary myelofibrosis   . Diverticular disease   . Hemorrhoid   . CAD (coronary artery disease)     a. S/p 3V CABG 2000 with LIMA-LAD, left radial-PDA, SVG-OM1. b. 2014: stent to SVG-OM1; then found to have obstruction distal to LAD anastamosis of LIMA s/p DES.   Marland Kitchen Peripheral vascular disease     a. R femoropopliteal bypass ~2012 in [place], reportedly shown to be occluded. Now followed by Dr. [name].  . DM (diabetes mellitus)   . Hyperlipidemia   . HTN (hypertension)   . RBBB     MEDICATIONS: Current Outpatient Prescriptions on File Prior to Visit  Medication Sig Dispense Refill  . aspirin EC 81 MG tablet Take 81 mg by mouth daily.    Marland Kitchen atorvastatin (LIPITOR) 20 MG tablet Take 20 mg by mouth daily. Takes along with a 40 mg tablet daily    . atorvastatin (LIPITOR) 40 MG tablet Take 1 tablet (40 mg total) by mouth daily. 90 tablet 3  . carvedilol (COREG) 6.25 MG tablet Take 1 tablet (6.25 mg total) by mouth 2 (two) times daily with a meal. 180 tablet 3  . cholecalciferol (VITAMIN D) 1000 UNITS tablet Take 1,000 Units by mouth daily.    . clopidogrel (PLAVIX) 75 MG tablet Take 1 tablet (75 mg total) by mouth daily with breakfast. 30 tablet 11  . fluticasone (FLONASE) 50 MCG/ACT nasal spray Place 1 spray into both nostrils daily. 16 g 6  . HYDROcodone-acetaminophen (NORCO/VICODIN) 5-325 MG per tablet Take 1 tablet by mouth every 6 (six) hours as needed. 15 tablet 0  . hydroxyurea (HYDREA) 500 MG capsule TAKE ONE CAPSULE BY MOUTH TWICE DAILY 60 capsule 1  . Insulin Glargine (LANTUS SOLOSTAR) 100 UNIT/ML Solostar Pen Inject 10 Units into the skin 2 (two) times daily. (Patient taking differently: Inject 10-12 Units into the skin 2 (two) times  daily. 12 units in the morning and 10 units in the evening) 15 mL 12  . insulin lispro (HUMALOG) 100 UNIT/ML injection Inject 4 Units into the skin 3 (three) times daily before meals.     . isosorbide mononitrate (IMDUR) 60 MG 24 hr tablet Take 60-120 mg by mouth 2 (two) times daily. 2 in the morning and 1 in the evening    . levETIRAcetam (KEPPRA) 500 MG tablet Take 1 tablet (500 mg total) by mouth 2 (two) times daily. 60 tablet 11  . Linagliptin-Metformin HCl 2.5-500 MG TABS Take 1 tablet by mouth 2 (two) times daily.    Marland Kitchen PROAIR HFA 108 (90 BASE) MCG/ACT inhaler Inhale 1 puff into the lungs every 4 (four) hours as needed for wheezing or shortness of breath.     . nitroGLYCERIN (NITROSTAT) 0.4 MG SL tablet Place 1 tablet (0.4 mg total) under the tongue every 5 (five) minutes as needed for chest pain. (Patient not taking: Reported on 07/06/2015) 25 tablet 12   No current facility-administered medications on file prior to visit.    ALLERGIES: No Known Allergies  FAMILY HISTORY: Family History  Problem Relation Age of Onset  . Hyperlipidemia Maternal Grandmother   . Asthma Father   . Heart disease Father   . CAD      Multiple siblings    SOCIAL HISTORY: Social History   Social History  . Marital Status: Married    Spouse Name: N/A  . Number of Children: N/A  . Years of Education: N/A   Occupational History  . Limo driver    Social History Main Topics  . Smoking status: Never Smoker   . Smokeless tobacco: Never Used  . Alcohol Use: No  . Drug Use: No  . Sexual Activity: Not on file   Other Topics Concern  . Not on file   Social History Narrative   Lives with wife.     REVIEW OF SYSTEMS: Constitutional: No fevers, chills, or sweats, no generalized fatigue, change in appetite Eyes: No visual changes, double vision, eye pain Ear, nose and throat: No hearing loss, ear pain, nasal congestion, sore throat Cardiovascular: No chest pain, palpitations Respiratory:  No  shortness of breath at rest or with exertion, wheezes GastrointestinaI: No nausea, vomiting, diarrhea, abdominal pain, fecal incontinence Genitourinary:  No dysuria, urinary retention or frequency Musculoskeletal:  No neck pain, back pain; +right leg and elbow pain Integumentary: No rash, pruritus, skin lesions Neurological: as above Psychiatric: No depression, insomnia, anxiety Endocrine: No palpitations, fatigue, diaphoresis, mood swings, change in appetite, change in weight, increased thirst Hematologic/Lymphatic:  No anemia, purpura, petechiae. Allergic/Immunologic: no itchy/runny eyes, nasal congestion, recent allergic reactions, rashes  PHYSICAL EXAM: Filed Vitals:   07/06/15 1100  BP: 110/82  Pulse: 85  Resp: 16   General: No acute distress, became tearful at the end of the visit about his medical conditions Head:  Normocephalic/atraumatic Neck: supple, no paraspinal tenderness, full range of motion Heart:  Regular rate and rhythm Lungs:  Clear to auscultation bilaterally Back: No paraspinal tenderness Skin/Extremities: No rash, no edema Neurological Exam: alert and oriented to person, place, and time. No aphasia or dysarthria. Fund of knowledge is appropriate.  Recent and remote memory are intact.  Attention and concentration are normal.    Able to name objects and repeat phrases. Cranial nerves: Pupils equal, round, reactive to light.  Fundoscopic exam unremarkable, no papilledema. Extraocular movements intact with no nystagmus. Visual fields full but reports black spot on the right peripheral field with individual eye testing. Facial sensation intact. No facial asymmetry. Tongue, uvula, palate midline.  Motor: Bulk and tone normal, muscle strength 5/5 throughout with no pronator drift.  Sensation to light touch intact.  No extinction to double simultaneous stimulation.  Deep tendon reflexes 2+ throughout, toes downgoing.  Finger to nose testing intact.  Gait narrow-based and  steady, mild difficulty with tandem walk but able. Romberg negative.  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 No further seizures since October, however today he is reporting a 2-week history of a black spot in his right visual field. This is concerning for similar symptoms prior to his seizure. He was instructed to increase Keppra to $RemoveB'750mg'[id number]$  BID. A repeat head CT will be ordered to rule out new stroke. He cannot obtain an MRI due to loop recorder. Continue aspirin and Plavix, as well as control of vascular risk  factors. He was instructed to call his eye doctor as well due to the haloes he reports at night, which can also be seen with cataracts and glaucoma. I have advised him to hold off on driving for now, he is aware of [place] driving laws to stop driving after a seizure, until 6 months seizure-free. He knows to go to the ER immediately for any change in symptoms. He became tearful about his medical and social problems, saying he is not sure he can take it anymore, I reassured him and encouraged he see a counselor, he will call our office and think about it. He will follow-up in 3 months and knows to call our office for any problems in the interim.  Thank you for allowing me to participate in his care.  Please do not hesitate to call for any questions or concerns.  The duration of this appointment visit was 25 minutes of face-to-face time with the patient.  Greater than 50% of this time was spent in counseling, explanation of diagnosis, planning of further management, and coordination of care.   Ellouise Newer, M.D.   CC: Dr. Sheryn Bison

## 2015-07-06 NOTE — Patient Instructions (Signed)
1. Schedule head CT without contrast 2. Increase Keppra 500mg : take 1 & 1/2 tablets twice a day 3. Call your eye doctor and let them know of the new symptoms 4. Hold off on driving for now 5. Call our office for worsening of symptoms 6. Follow-up in 3 months

## 2015-07-07 ENCOUNTER — Ambulatory Visit (INDEPENDENT_AMBULATORY_CARE_PROVIDER_SITE_OTHER): Payer: Medicare Other | Admitting: *Deleted

## 2015-07-07 DIAGNOSIS — I6330 Cerebral infarction due to thrombosis of unspecified cerebral artery: Secondary | ICD-10-CM

## 2015-07-08 ENCOUNTER — Ambulatory Visit
Admission: RE | Admit: 2015-07-08 | Discharge: 2015-07-08 | Disposition: A | Discharge status: Home / Self Care | Payer: 59 | Source: Ambulatory Visit | Attending: Neurology | Admitting: Neurology

## 2015-07-08 ENCOUNTER — Telehealth: Payer: Self-pay | Admitting: Neurology

## 2015-07-08 DIAGNOSIS — R51 Headache: Secondary | ICD-10-CM | POA: Diagnosis not present

## 2015-07-08 DIAGNOSIS — R42 Dizziness and giddiness: Secondary | ICD-10-CM | POA: Diagnosis not present

## 2015-07-08 NOTE — Telephone Encounter (Signed)
Unable to leave VM on phone, head CT reviewed, no evidence of new stroke.

## 2015-07-08 NOTE — Progress Notes (Signed)
Loop recorder 

## 2015-07-11 ENCOUNTER — Telehealth: Payer: Self-pay | Admitting: Neurology

## 2015-07-11 NOTE — Telephone Encounter (Signed)
I called patient and notified him of results. He states that he is doing ok today.

## 2015-07-11 NOTE — Telephone Encounter (Signed)
PT called and wanted to know if its ok for him to drive/Dawn CB# [phone]

## 2015-07-11 NOTE — Telephone Encounter (Signed)
Tiff, can you pls let him know head CT is unremarkable, no new stroke. Pls follow-up on how he is doing, thanks

## 2015-07-11 NOTE — Telephone Encounter (Signed)
If he is not having any more symptoms, ok to drive. He should stop driving if he has any confusion or loss of consciousness. Thanks

## 2015-07-11 NOTE — Telephone Encounter (Signed)
Please advise 

## 2015-07-12 NOTE — Telephone Encounter (Signed)
Tried calling patient several times with no answer. Outgoing message keeps saying person is unavailable.

## 2015-07-13 ENCOUNTER — Encounter: Payer: Self-pay | Admitting: Internal Medicine

## 2015-07-13 LAB — CUP PACEART REMOTE DEVICE CHECK: Date Time Interrogation Session: 20160929190655

## 2015-07-13 NOTE — Progress Notes (Signed)
Carelink summary report received. Battery status OK. Normal device function. No new symptom episodes, tachy episodes, brady, or pause episodes. No new AF episodes. Monthly summary reports. Recall created to see GT.

## 2015-07-20 ENCOUNTER — Ambulatory Visit (INDEPENDENT_AMBULATORY_CARE_PROVIDER_SITE_OTHER): Payer: Medicare Other | Admitting: Internal Medicine

## 2015-07-20 ENCOUNTER — Encounter: Payer: Self-pay | Admitting: Internal Medicine

## 2015-07-20 VITALS — BP 138/60 | HR 82 | Ht 67.0 in | Wt 195.0 lb

## 2015-07-20 DIAGNOSIS — I639 Cerebral infarction, unspecified: Secondary | ICD-10-CM

## 2015-07-20 DIAGNOSIS — I10 Essential (primary) hypertension: Secondary | ICD-10-CM | POA: Diagnosis not present

## 2015-07-20 DIAGNOSIS — I6330 Cerebral infarction due to thrombosis of unspecified cerebral artery: Secondary | ICD-10-CM

## 2015-07-20 LAB — CUP PACEART INCLINIC DEVICE CHECK: Date Time Interrogation Session: 20161012180522

## 2015-07-20 NOTE — Progress Notes (Signed)
HPI Tyler Whitehead returns today for followup of cryptogenic stroke. He is a pleasant 74 yo man with HTN who sustained a stroke over a year ago. He has had no atrial arrhythmias in the interim. He denies syncope. He notes trouble sleeping and feels fatigued and tired during the day. He admits to sleeping during the day. No Known Allergies   Current Outpatient Prescriptions  Medication Sig Dispense Refill  . aspirin EC 81 MG tablet Take 81 mg by mouth daily.    Marland Kitchen atorvastatin (LIPITOR) 20 MG tablet Take 20 mg by mouth daily. Takes along with a 40 mg tablet daily    . atorvastatin (LIPITOR) 40 MG tablet Take 1 tablet (40 mg total) by mouth daily. 90 tablet 3  . carvedilol (COREG) 6.25 MG tablet Take 1 tablet (6.25 mg total) by mouth 2 (two) times daily with a meal. 180 tablet 3  . cholecalciferol (VITAMIN D) 1000 UNITS tablet Take 1,000 Units by mouth daily.    . clopidogrel (PLAVIX) 75 MG tablet Take 1 tablet (75 mg total) by mouth daily with breakfast. 30 tablet 11  . fluticasone (FLONASE) 50 MCG/ACT nasal spray Place 1 spray into both nostrils daily. 16 g 6  . HYDROcodone-acetaminophen (NORCO/VICODIN) 5-325 MG per tablet Take 1 tablet by mouth every 6 (six) hours as needed. 15 tablet 0  . hydroxyurea (HYDREA) 500 MG capsule TAKE ONE CAPSULE BY MOUTH TWICE DAILY 60 capsule 1  . Insulin Glargine (LANTUS SOLOSTAR) 100 UNIT/ML Solostar Pen Inject 10 Units into the skin 2 (two) times daily. (Patient taking differently: Inject 22 Units into the skin 2 (two) times daily. ) 15 mL 12  . insulin lispro (HUMALOG) 100 UNIT/ML injection Inject 4 Units into the skin 3 (three) times daily before meals.     . isosorbide mononitrate (IMDUR) 60 MG 24 hr tablet Take 60-120 mg by mouth 2 (two) times daily. 2 in the morning and 1 in the evening    . levETIRAcetam (KEPPRA) 500 MG tablet Take 1 and 1/2 tablets by mouth twice daily    . Linagliptin-Metformin HCl 2.5-500 MG TABS Take 1 tablet by mouth 2 (two) times  daily.    Marland Kitchen PROAIR HFA 108 (90 BASE) MCG/ACT inhaler Inhale 1 puff into the lungs every 4 (four) hours as needed for wheezing or shortness of breath.     . nitroGLYCERIN (NITROSTAT) 0.4 MG SL tablet Place 1 tablet (0.4 mg total) under the tongue every 5 (five) minutes as needed for chest pain. (Patient not taking: Reported on 07/06/2015) 25 tablet 12   No current facility-administered medications for this visit.     Past Medical History  Diagnosis Date  . 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[place])   . Diverticular disease   . Hemorrhoid   . CAD (coronary artery disease)     a. S/p 3V CABG 2000 with LIMA-LAD, left radial-PDA, SVG-OM1. b. 2014: stent to SVG-OM1; then found to have obstruction distal to LAD anastamosis of LIMA s/p DES.   Marland Kitchen Peripheral vascular disease ([place])     a. R femoropopliteal bypass ~2012 in [place], reportedly shown to be occluded. Now followed by Dr. [name].  . DM (diabetes mellitus) ([place])   . Hyperlipidemia   . HTN (hypertension)   . RBBB     ROS:   All systems reviewed and negative except as noted in the HPI.   Past Surgical History  Procedure Laterality Date  . Coronary artery bypass graft  2000  LM occluded, LIMA to LAD patent but distal native LAD disease, saphenous vein bypass graft to OM with 95% stenosis in the midportion, right coronary artery occluded, radial artery graft to right coronary artery patent but was diffusely small. Marginal graft stented. 7 2014. Of note he had multiple previous stents in the right coronary artery. The distal LAD was treated with angioplasty. I  . Cardiac catheterization  May 2014  . Percutaneous coronary stent intervention (pci-s)  April 27 2013  . Vein surgery    . Tee without cardioversion N/A 07/12/2014    Procedure: TRANSESOPHAGEAL ECHOCARDIOGRAM (TEE);  Surgeon: Fay Records, MD;  Location: [facility] ENDOSCOPY;  Service: Cardiovascular;  Laterality: N/A;  . Loop recorder implant N/A 07/12/2014    Procedure: LOOP RECORDER IMPLANT;  Surgeon:  Evans Lance, MD;  Location: [facility] CATH LAB;  Service: Cardiovascular;  Laterality: N/A;  . Abdominal aortagram N/A 09/21/2014    Procedure: ABDOMINAL Maxcine Ham;  Surgeon: Elam Dutch, MD;  Location: [facility] CATH LAB;  Service: Cardiovascular;  Laterality: N/A;  . Lower extremity angiogram  09/21/2014    Procedure: LOWER EXTREMITY ANGIOGRAM;  Surgeon: Elam Dutch, MD;  Location: [facility] CATH LAB;  Service: Cardiovascular;;  . Abdominal angiogram N/A 01/18/2015    Procedure: ABDOMINAL ANGIOGRAM;  Surgeon: Serafina Mitchell, MD;  Location: [facility] CATH LAB;  Service: Cardiovascular;  Laterality: N/A;     Family History  Problem Relation Age of Onset  . Hyperlipidemia Maternal Grandmother   . Asthma Father   . Heart disease Father   . CAD      Multiple siblings     Social History   Social History  . Marital Status: Married    Spouse Name: N/A  . Number of Children: N/A  . Years of Education: N/A   Occupational History  . Limo driver    Social History Main Topics  . Smoking status: Never Smoker   . Smokeless tobacco: Never Used  . Alcohol Use: No  . Drug Use: No  . Sexual Activity: Not on file   Other Topics Concern  . Not on file   Social History Narrative   Lives with wife.      BP 138/60 mmHg  Pulse 82  Ht 5\' 7"  (1.702 m)  Wt 195 lb (88.451 kg)  BMI 30.53 kg/m2  Physical Exam:  Well appearing 74 yo man, NAD HEENT: Unremarkable Neck:  6 cm JVD, no thyromegally Lymphatics:  No adenopathy Back:  No CVA tenderness Lungs:  Clear with no wheezes HEART:  Regular rate rhythm, no murmurs, no rubs, no clicks Abd:  soft, positive bowel sounds, no organomegally, no rebound, no guarding Ext:  2 plus pulses, no edema, no cyanosis, no clubbing Skin:  No rashes no nodules Neuro:  CN II through XII intact, motor grossly intact   DEVICE  Normal device function.  See PaceArt for details.   Assess/Plan:

## 2015-07-20 NOTE — Assessment & Plan Note (Signed)
His blood pressure is well controlled. He will continue his current meds. 

## 2015-07-20 NOTE — Patient Instructions (Signed)

## 2015-07-20 NOTE — Assessment & Plan Note (Signed)
Still no obviousl cause of his stroke which occurred over a year ago. He has recovered with minimal deficit. His ILR has not shown any atrial fib.

## 2015-07-27 ENCOUNTER — Encounter: Payer: Self-pay | Admitting: Internal Medicine

## 2015-07-28 ENCOUNTER — Telehealth: Payer: Self-pay | Admitting: *Deleted

## 2015-07-28 ENCOUNTER — Ambulatory Visit (HOSPITAL_BASED_OUTPATIENT_CLINIC_OR_DEPARTMENT_OTHER): Payer: Medicare Other | Admitting: Hematology

## 2015-07-28 ENCOUNTER — Encounter: Payer: Self-pay | Admitting: Hematology

## 2015-07-28 ENCOUNTER — Other Ambulatory Visit (HOSPITAL_BASED_OUTPATIENT_CLINIC_OR_DEPARTMENT_OTHER): Payer: Medicare Other

## 2015-07-28 ENCOUNTER — Telehealth: Payer: Self-pay | Admitting: Hematology

## 2015-07-28 VITALS — BP 138/73 | HR 18 | Temp 97.7°F | Resp 18 | Ht 67.0 in | Wt 200.7 lb

## 2015-07-28 DIAGNOSIS — Z8673 Personal history of transient ischemic attack (TIA), and cerebral infarction without residual deficits: Secondary | ICD-10-CM | POA: Diagnosis not present

## 2015-07-28 DIAGNOSIS — M545 Low back pain: Secondary | ICD-10-CM

## 2015-07-28 DIAGNOSIS — I739 Peripheral vascular disease, unspecified: Secondary | ICD-10-CM | POA: Diagnosis not present

## 2015-07-28 DIAGNOSIS — I10 Essential (primary) hypertension: Secondary | ICD-10-CM

## 2015-07-28 DIAGNOSIS — I25810 Atherosclerosis of coronary artery bypass graft(s) without angina pectoris: Secondary | ICD-10-CM

## 2015-07-28 DIAGNOSIS — E785 Hyperlipidemia, unspecified: Secondary | ICD-10-CM

## 2015-07-28 DIAGNOSIS — D45 Polycythemia vera: Secondary | ICD-10-CM

## 2015-07-28 DIAGNOSIS — D7581 Myelofibrosis: Secondary | ICD-10-CM

## 2015-07-28 LAB — CBC & DIFF AND RETIC
BASO%: 1.1 % (ref 0.0–2.0)
Basophils Absolute: 0.2 10*3/uL — ABNORMAL HIGH (ref 0.0–0.1)
EOS%: 3.6 % (ref 0.0–7.0)
Eosinophils Absolute: 0.7 10*3/uL — ABNORMAL HIGH (ref 0.0–0.5)
HCT: 42.1 % (ref 38.4–49.9)
HGB: 12.2 g/dL — ABNORMAL LOW (ref 13.0–17.1)
Immature Retic Fract: 17.8 % — ABNORMAL HIGH (ref 3.00–10.60)
LYMPH%: 8.9 % — ABNORMAL LOW (ref 14.0–49.0)
MCH: 21.7 pg — ABNORMAL LOW (ref 27.2–33.4)
MCHC: 29 g/dL — ABNORMAL LOW (ref 32.0–36.0)
MCV: 74.9 fL — ABNORMAL LOW (ref 79.3–98.0)
MONO#: 1 10*3/uL — ABNORMAL HIGH (ref 0.1–0.9)
MONO%: 4.9 % (ref 0.0–14.0)
NEUT#: 15.7 10*3/uL — ABNORMAL HIGH (ref 1.5–6.5)
NEUT%: 81.5 % — ABNORMAL HIGH (ref 39.0–75.0)
Platelets: 442 10*3/uL — ABNORMAL HIGH (ref 140–400)
RBC: 5.62 10*6/uL (ref 4.20–5.82)
RDW: 17.7 % — ABNORMAL HIGH (ref 11.0–14.6)
Retic %: 1.47 % (ref 0.80–1.80)
Retic Ct Abs: 82.61 10*3/uL (ref 34.80–93.90)
WBC: 19.3 10*3/uL — ABNORMAL HIGH (ref 4.0–10.3)
lymph#: 1.7 10*3/uL (ref 0.9–3.3)
nRBC: 0 % (ref 0–0)

## 2015-07-28 LAB — COMPREHENSIVE METABOLIC PANEL (CC13)
ALT: <9 U/L (ref 0–55)
AST: 12 U/L (ref 5–34)
Albumin: 3.4 g/dL — ABNORMAL LOW (ref 3.5–5.0)
Alkaline Phosphatase: 101 U/L (ref 40–150)
Anion Gap: 6 mEq/L (ref 3–11)
BUN: 10.3 mg/dL (ref 7.0–26.0)
CO2: 27 mEq/L (ref 22–29)
Calcium: 9.1 mg/dL (ref 8.4–10.4)
Chloride: 110 mEq/L — ABNORMAL HIGH (ref 98–109)
Creatinine: 1 mg/dL (ref 0.7–1.3)
EGFR: 87 mL/min/{1.73_m2} — ABNORMAL LOW (ref >90)
Glucose: 128 mg/dl (ref 70–140)
Potassium: 3.9 mEq/L (ref 3.5–5.1)
Sodium: 143 mEq/L (ref 136–145)
Total Bilirubin: 0.44 mg/dL (ref 0.20–1.20)
Total Protein: 6 g/dL — ABNORMAL LOW (ref 6.4–8.3)

## 2015-07-28 NOTE — Progress Notes (Signed)
[facility] OFFICE PROGRESS NOTE  [name], MD [phone] [address]. [address], [address] [place] [address]  DIAGNOSIS: Myelofibrosis/Myeloproliferative disorder (Overlap). JAK2+.  CHIEF COMPLAINS: follow up   CURRENT TREATMENT:    1.Hydrea 500 mg twice daily. 2. Aspirin daily. 3. Therapeutic phlebotomy if hematocrit more than 45%    Myelofibrosis ([place])   12/26/2012 Bone Marrow Biopsy Showed hypercellularity with reticulum fibrosis, JAK-2 mutation positive, BCR/ABL negative, 23 XY in 20 metaphase, and Intermediate-1 ,DIPSS of 2. (WBC < 25; Hbg > 10;    01/02/2013 Imaging US abdomen complete revealed Mild splenogmealy (13.3 cm) and a 2.2 x 1.7 x 1.8 cm left renal cyst   01/15/2013 Pathology Results Integrated hematopathology report.  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    03/17/2013 Imaging CT Chest: Crowded peribronchial markings noted, associated with linear densities involving the left base, probably representing areas of platelike atelectasis/scarring.  No mass or infiltrate seen.    03/19/2013 - 07/20/2013 Chemotherapy Started Jakafi 20 mg bid. Dose reduced due to anemia.  Stopped due to increasing fatigue while on jakafi.    04/28/2013 - 05/01/2013 Hospital Admission Admitted to [facility] by Dr. Francia Greaves, his cardiologist, due to the fact that he had midsternal chest pain.  He had a coronary stent placed this admission.    05/28/2013 - 05/29/2013 Hospital Admission Admitted to [facility] and received 3 units of blood (Dr. Lendon Colonel note).    06/10/2013 Surgery Cololonoscopy due to GIB.  C/w diverticular disease and hemorrhoids but no active bleeding.    10/05/2013 -  Chemotherapy Started Hydrea 500 mg daily.   03/19/2014 -  Chemotherapy Hydrea increased to 500 mg bid wbc 17, hb 17, hct 54, plt 437    06/29/2014 Procedure cbc shows wbc 18.8 hb 18.7 hct 58.9 plt 456. ?compliance w hydrea as MCV not elevated. symptomatic start TP weekly x 4   07/28/2014 Procedure TP every 2 weeks if hct > 45, changed to monthly in April 2016     INTERVAL HISTORY:  Tyler Whitehead 74 y.o. male with myleofibrosis who returns follow up. He complains of low back pain. He started a few days ago at left paraspinal area, and he removed 2 right paraspinal area yesterday. It's intermittent, related to position change, and he has not taking any pain medication or using a heating pad. He denies any shooting pain to his legs, no leg weakness or other neurological symptoms. He denies any injury. He otherwise feels well overall, his dysphasia or near resolved, his energy level and appetite are fair. Weight is stable. No night sweats or abdominal discomfort.  MEDICAL HISTORY: Past Medical History  Diagnosis Date  . 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[place])   . Diverticular disease   . Hemorrhoid   . CAD (coronary artery disease)     a. S/p 3V CABG 2000 with LIMA-LAD, left radial-PDA, SVG-OM1. b. 2014: stent to SVG-OM1; then found to have obstruction distal to LAD anastamosis of LIMA s/p DES.   Marland Kitchen Peripheral vascular disease ([place])     a. R femoropopliteal bypass ~2012 in [place], reportedly shown to be occluded. Now followed by Dr. [name].  . DM (diabetes mellitus) ([place])   . Hyperlipidemia   . HTN (hypertension)   . RBBB      ALLERGIES:  has No Known Allergies.  MEDICATIONS: has a current medication list which includes the following prescription(s): aspirin ec, atorvastatin,  atorvastatin, carvedilol, cholecalciferol, clopidogrel, fluticasone, hydrocodone-acetaminophen, hydroxyurea, insulin glargine, insulin lispro, isosorbide mononitrate, levetiracetam, linagliptin-metformin hcl, nitroglycerin, and proair hfa.  SURGICAL HISTORY:  Past Surgical History  Procedure Laterality Date  . Coronary artery bypass graft  2000    LM occluded, LIMA  to LAD patent but distal native LAD disease, saphenous vein bypass graft to OM with 95% stenosis in the midportion, right coronary artery occluded, radial artery graft to right coronary artery patent but was diffusely small. Marginal graft stented. 7 2014. Of note he had multiple previous stents in the right coronary artery. The distal LAD was treated with angioplasty. I  . Cardiac catheterization  May 2014  . Percutaneous coronary stent intervention (pci-s)  April 27 2013  . Vein surgery    . Tee without cardioversion N/A 07/12/2014    Procedure: TRANSESOPHAGEAL ECHOCARDIOGRAM (TEE);  Surgeon: Fay Records, MD;  Location: [facility] ENDOSCOPY;  Service: Cardiovascular;  Laterality: N/A;  . Loop recorder implant N/A 07/12/2014    Procedure: LOOP RECORDER IMPLANT;  Surgeon: Evans Lance, MD;  [facility] C[facility] LAB;  Service: Cardiovascular;  Laterality: N/A;  . Abdominal aortagram N/A 09/21/2014    Procedure: ABDOMINAL Maxcine Ham;  Surgeon: Elam Dutch, MD;  Location: [facility] C[facility] LAB;  Service: Cardiovascular;  Laterality: N/A;  . Lower extremity angiogram  09/21/2014    Procedure: LOWER EXTREMITY ANGIOGRAM;  Surgeon: Elam Dutch, MD;  Location: [facility] CATH LAB;  Service: Cardiovascular;;  . Abdominal angiogram N/A 01/18/2015    Procedure: ABDOMINAL ANGIOGRAM;  Surgeon: Serafina Mitchell, MD;  Location: [facility] CATH LAB;  Service: Cardiovascular;  Laterality: N/A;     Medication List       This list is accurate as of: 07/28/15  8:55 AM.  Always use your most recent med list.               aspirin EC 81 MG tablet  Take 81 mg by mouth daily.     atorvastatin 20 MG tablet  Commonly known as:  LIPITOR  Take 20 mg by mouth daily. Takes along with a 40 mg tablet daily     atorvastatin 40 MG tablet  Commonly known as:  LIPITOR  Take 1 tablet (40 mg total) by mouth daily.     carvedilol 6.25 MG tablet  Commonly known as:  COREG  Take 1 tablet (6.25 mg total) by mouth 2 (two) times daily with a meal.      cholecalciferol 1000 UNITS tablet  Commonly known as:  VITAMIN D  Take 1,000 Units by mouth daily.     clopidogrel 75 MG tablet  Commonly known as:  PLAVIX  Take 1 tablet (75 mg total) by mouth daily with breakfast.     fluticasone 50 MCG/ACT nasal spray  Commonly known as:  FLONASE  Place 1 spray into both nostrils daily.     HYDROcodone-acetaminophen 5-325 MG tablet  Commonly known as:  NORCO/VICODIN  Take 1 tablet by mouth every 6 (six) hours as needed.     hydroxyurea 500 MG capsule  Commonly known as:  HYDREA  TAKE ONE CAPSULE BY MOUTH TWICE DAILY     Insulin Glargine 100 UNIT/ML Solostar Pen  Commonly known as:  LANTUS SOLOSTAR  Inject 10 Units into the skin 2 (two) times daily.     insulin lispro 100 UNIT/ML injection  Commonly known as:  HUMALOG  Inject 4 Units into the skin 3 (three) times daily before meals.     isosorbide mononitrate 60  MG 24 hr tablet  Commonly known as:  IMDUR  Take 60-120 mg by mouth 2 (two) times daily. 2 in the morning and 1 in the evening     levETIRAcetam 500 MG tablet  Commonly known as:  KEPPRA  Take 1 and 1/2 tablets by mouth twice daily     Linagliptin-Metformin HCl 2.5-500 MG Tabs  Take 1 tablet by mouth 2 (two) times daily.     nitroGLYCERIN 0.4 MG SL tablet  Commonly known as:  NITROSTAT  Place 1 tablet (0.4 mg total) under the tongue every 5 (five) minutes as needed for chest pain.     PROAIR HFA 108 (90 BASE) MCG/ACT inhaler  Generic drug:  albuterol  Inhale 1 puff into the lungs every 4 (four) hours as needed for wheezing or shortness of breath.        REVIEW OF SYSTEMS:   Constitutional: Denies fevers, chills or abnormal weight loss, (+) fatigue  Eyes: Admits to blurriness of vision and floaters Ears, nose, mouth, throat, and face: Denies mucositis or sore throat Respiratory: Denies cough, dyspnea or wheezes Cardiovascular: Denies palpitation, chest discomfort or lower extremity swelling Gastrointestinal:  Denies  nausea, heartburn or change in bowel habits Skin: Denies abnormal skin rashes Lymphatics: Denies new lymphadenopathy or easy bruising Neurological:Denies numbness, tingling or new weaknesses Behavioral/Psych: Mood is stable, no new changes  All other systems were reviewed with the patient and are negative.  PHYSICAL EXAMINATION: ECOG PERFORMANCE STATUS: 1  Blood pressure 138/73, pulse 18, temperature 97.7 F (36.5 C), temperature source Oral, resp. rate 18, height '5\' 7"'  (1.702 m), weight 200 lb 11.2 oz (91.037 kg), SpO2 100 %.  GENERAL:alert, no distress and comfortable; elderly male who appears his stated age.  SKIN: skin color, texture, turgor are normal, no rashes or significant lesions EYES: normal, Conjunctiva are pink and non-injected, sclera clear OROPHARYNX:no exudate, no erythema and lips, buccal mucosa, and tongue normal  NECK: supple, thyroid normal size, non-tender, without nodularity LYMPH:  no palpable lymphadenopathy in the cervical, axillary or supraclavicular LUNGS: clear to auscultation with normal breathing effort, no wheezes or rhonchi HEART: regular rate & rhythm and no murmurs and no lower extremity edema ABDOMEN:abdomen soft, non-tender and normal bowel sounds Musculoskeletal:no cyanosis of digits and no clubbing, no significant tenderness at the spine or paraspinal area. NEURO: alert & oriented x 3 with fluent speech, no focal motor/sensory deficits  Labs:  CBC Latest Ref Rng 07/28/2015 06/08/2015 04/27/2015  WBC 4.0 - 10.3 10e3/uL 19.3(H) 16.2(H) 14.7(H)  Hemoglobin 13.0 - 17.1 g/dL 12.2(L) 13.7 12.8(L)  Hematocrit 38.4 - 49.9 % 42.1 46.3 43.1  Platelets 140 - 400 10e3/uL 442(H) 586(H) 288    CMP Latest Ref Rng 02/16/2015 01/18/2015 12/22/2014  Glucose 70 - 99 mg/dL 159(H) 148(H) 137  BUN 6 - 20 mg/dL 9 13 12.2  Creatinine 0.61 - 1.24 mg/dL 0.93 1.10 1.0  Sodium 135 - 145 mmol/L 140 142 144  Potassium 3.5 - 5.1 mmol/L 4.1 4.0 4.4  Chloride 101 - 111 mmol/L  105 102 -  CO2 22 - 32 mmol/L 28 - 28  Calcium 8.9 - 10.3 mg/dL 9.1 - 9.4  Total Protein 6.4 - 8.3 g/dL - - 6.2(L)  Total Bilirubin 0.20 - 1.20 mg/dL - - 0.65  Alkaline Phos 40 - 150 U/L - - 86  AST 5 - 34 U/L - - 15  ALT 0 - 55 U/L - - 21     RADIOGRAPHIC STUDIES: CT abdomen and pelvis with  contrast on 12/14/2014  IMPRESSION: 1. No acute findings within the abdomen or pelvis. 2. Atherosclerotic disease. 3. Kidney cysts.  ASSESSMENT: Tyler Whitehead 74 y.o. male with a history of Myelofibrosis, jak 2 mutation and symptomatic polycythemia. Wbc and platelets are high as well.   PLAN:   1. Primary Myeloproliferative neoplasm (Myelofibrosis).  -We discussed that this is not curable disease, but treatable. Some patient will develop acute leukemia or aplastic anemia later on. -he is not a good candidate for stem cell transplant due to his advanced age and multiple medical comorbidities. -I reviewed his CT of abdomen, which showed normal liver and spleen. -I discussed that he is at high risk for thrombosis secondary to his disease, he will continue aspirin. -We again emphasized the importance of continued Hydrea and normalized his blood counts to prevent further stroke or thrombosis. He agrees to continue Hydrea and compliant with treatment. -He has been requiring phlebotomy every a few months, his white count is still high, I recommend him to increase his Hydrea dose, but he prefers to stay on the current dose and do phlebotomy. -His hematocrit was 42% today, no phlebotomy today -He is clinically stable, dysphagia and fatigue has improved.  -check CBC monthly and phlebotomy if HCT>=45%  2. Low back pain - likely muscular pain.  -I recommend him to use heating pad, and ibuprofen as needed  -If his pain does not improve, he will call his primary care physician for follow up.  3. CADz s/p CABG x 3, hypertension  --Continue lisinopril, coreg, aspirin   4. PvDz.  --Continue Imdur as it  appearing to have improved some of his claudication symptoms.   5. Dyslipidemia.  --Continue statin therapy.   6. Stroke and seizure in 07/2014 -- follow up with his neurologist   Plan; - continue Hydrea at same dose  -CBC every 4 weeks and phlebotomy if HCT>=45% -Return to clinic in 3 months.    All questions were answered. The patient knows to call the clinic with any problems, questions or concerns. We can certainly see the patient much sooner if necessary.  I spent 20 minutes counseling the patient face to face. The total time spent in the appointment was 25 minutes.  Truitt Merle 07/28/2015

## 2015-07-28 NOTE — Telephone Encounter (Signed)
Per staff message and POF I have scheduled appts. Advised scheduler of appts. JMW  

## 2015-07-28 NOTE — Telephone Encounter (Signed)
per pof to sch pt appt-sent MW email to sch phlebotomy-willcall pt after reply

## 2015-08-01 ENCOUNTER — Encounter: Payer: Self-pay | Admitting: Internal Medicine

## 2015-08-08 ENCOUNTER — Ambulatory Visit (INDEPENDENT_AMBULATORY_CARE_PROVIDER_SITE_OTHER): Payer: Medicare Other | Admitting: *Deleted

## 2015-08-08 DIAGNOSIS — I639 Cerebral infarction, unspecified: Secondary | ICD-10-CM

## 2015-08-11 NOTE — Progress Notes (Signed)
Loop recorder 

## 2015-08-24 ENCOUNTER — Other Ambulatory Visit: Payer: Self-pay | Admitting: *Deleted

## 2015-08-24 DIAGNOSIS — E1351 Other specified diabetes mellitus with diabetic peripheral angiopathy without gangrene: Secondary | ICD-10-CM | POA: Diagnosis not present

## 2015-08-24 DIAGNOSIS — I70293 Other atherosclerosis of native arteries of extremities, bilateral legs: Secondary | ICD-10-CM | POA: Diagnosis not present

## 2015-08-24 DIAGNOSIS — D45 Polycythemia vera: Secondary | ICD-10-CM

## 2015-08-24 DIAGNOSIS — L602 Onychogryphosis: Secondary | ICD-10-CM | POA: Diagnosis not present

## 2015-08-24 NOTE — Progress Notes (Signed)
Stat CBC and POF entered for lab appt prior to infusion appt 08/25/15 sent to scheduler. marked urgent.

## 2015-08-25 ENCOUNTER — Ambulatory Visit (HOSPITAL_BASED_OUTPATIENT_CLINIC_OR_DEPARTMENT_OTHER): Payer: Medicare Other

## 2015-08-25 ENCOUNTER — Ambulatory Visit: Payer: 59

## 2015-08-25 DIAGNOSIS — D45 Polycythemia vera: Secondary | ICD-10-CM

## 2015-08-25 DIAGNOSIS — D7581 Myelofibrosis: Secondary | ICD-10-CM

## 2015-08-25 DIAGNOSIS — H43811 Vitreous degeneration, right eye: Secondary | ICD-10-CM | POA: Diagnosis not present

## 2015-08-25 DIAGNOSIS — E119 Type 2 diabetes mellitus without complications: Secondary | ICD-10-CM | POA: Diagnosis not present

## 2015-08-25 LAB — CBC & DIFF AND RETIC
BASO%: 1.3 % (ref 0.0–2.0)
Basophils Absolute: 0.3 10*3/uL — ABNORMAL HIGH (ref 0.0–0.1)
EOS%: 3.5 % (ref 0.0–7.0)
Eosinophils Absolute: 0.7 10*3/uL — ABNORMAL HIGH (ref 0.0–0.5)
HCT: 43.3 % (ref 38.4–49.9)
HGB: 12.3 g/dL — ABNORMAL LOW (ref 13.0–17.1)
Immature Retic Fract: 19.5 % — ABNORMAL HIGH (ref 3.00–10.60)
LYMPH%: 7.7 % — ABNORMAL LOW (ref 14.0–49.0)
MCH: 21 pg — ABNORMAL LOW (ref 27.2–33.4)
MCHC: 28.4 g/dL — ABNORMAL LOW (ref 32.0–36.0)
MCV: 74 fL — ABNORMAL LOW (ref 79.3–98.0)
MONO#: 1.1 10*3/uL — ABNORMAL HIGH (ref 0.1–0.9)
MONO%: 5.5 % (ref 0.0–14.0)
NEUT#: 16.3 10*3/uL — ABNORMAL HIGH (ref 1.5–6.5)
NEUT%: 82 % — ABNORMAL HIGH (ref 39.0–75.0)
Platelets: 498 10*3/uL — ABNORMAL HIGH (ref 140–400)
RBC: 5.85 10*6/uL — ABNORMAL HIGH (ref 4.20–5.82)
RDW: 17.3 % — ABNORMAL HIGH (ref 11.0–14.6)
Retic %: 1.83 % — ABNORMAL HIGH (ref 0.80–1.80)
Retic Ct Abs: 107.06 10*3/uL — ABNORMAL HIGH (ref 34.80–93.90)
WBC: 19.8 10*3/uL — ABNORMAL HIGH (ref 4.0–10.3)
lymph#: 1.5 10*3/uL (ref 0.9–3.3)
nRBC: 0 % (ref 0–0)

## 2015-08-25 NOTE — Progress Notes (Signed)
Hct: 43. No phlebotomy today per treatment criteria. Patient notified and verbalized understanding.

## 2015-09-03 LAB — CUP PACEART REMOTE DEVICE CHECK: Date Time Interrogation Session: 20161029193742

## 2015-09-03 NOTE — Progress Notes (Signed)
Carelink summary report received. Battery status OK. Normal device function. No new symptom episodes, tachy episodes, brady, or pause episodes. No new AF episodes. Monthly summary reports and ROV with GT in 10/2015. 

## 2015-09-05 ENCOUNTER — Ambulatory Visit (INDEPENDENT_AMBULATORY_CARE_PROVIDER_SITE_OTHER): Payer: Medicare Other | Admitting: *Deleted

## 2015-09-05 DIAGNOSIS — I639 Cerebral infarction, unspecified: Secondary | ICD-10-CM

## 2015-09-06 NOTE — Progress Notes (Signed)
Carelink Summary Report / Loop Recorder 

## 2015-09-22 ENCOUNTER — Ambulatory Visit (HOSPITAL_BASED_OUTPATIENT_CLINIC_OR_DEPARTMENT_OTHER): Payer: Medicare Other

## 2015-09-22 ENCOUNTER — Other Ambulatory Visit (HOSPITAL_BASED_OUTPATIENT_CLINIC_OR_DEPARTMENT_OTHER): Payer: Medicare Other

## 2015-09-22 VITALS — BP 119/69 | HR 76 | Temp 98.1°F | Resp 18

## 2015-09-22 DIAGNOSIS — D45 Polycythemia vera: Secondary | ICD-10-CM

## 2015-09-22 DIAGNOSIS — D7581 Myelofibrosis: Secondary | ICD-10-CM | POA: Diagnosis present

## 2015-09-22 LAB — CBC & DIFF AND RETIC
BASO%: 1.3 % (ref 0.0–2.0)
Basophils Absolute: 0.2 10*3/uL — ABNORMAL HIGH (ref 0.0–0.1)
EOS%: 3.3 % (ref 0.0–7.0)
Eosinophils Absolute: 0.6 10*3/uL — ABNORMAL HIGH (ref 0.0–0.5)
HCT: 46.6 % (ref 38.4–49.9)
HGB: 13.5 g/dL (ref 13.0–17.1)
Immature Retic Fract: 23.1 % — ABNORMAL HIGH (ref 3.00–10.60)
LYMPH%: 10.7 % — ABNORMAL LOW (ref 14.0–49.0)
MCH: 21.1 pg — ABNORMAL LOW (ref 27.2–33.4)
MCHC: 29 g/dL — ABNORMAL LOW (ref 32.0–36.0)
MCV: 72.9 fL — ABNORMAL LOW (ref 79.3–98.0)
MONO#: 1 10*3/uL — ABNORMAL HIGH (ref 0.1–0.9)
MONO%: 6 % (ref 0.0–14.0)
NEUT#: 13.2 10*3/uL — ABNORMAL HIGH (ref 1.5–6.5)
NEUT%: 78.7 % — ABNORMAL HIGH (ref 39.0–75.0)
Platelets: 333 10*3/uL (ref 140–400)
RBC: 6.39 10*6/uL — ABNORMAL HIGH (ref 4.20–5.82)
RDW: 18.1 % — ABNORMAL HIGH (ref 11.0–14.6)
Retic %: 1.78 % (ref 0.80–1.80)
Retic Ct Abs: 113.74 10*3/uL — ABNORMAL HIGH (ref 34.80–93.90)
WBC: 16.7 10*3/uL — ABNORMAL HIGH (ref 4.0–10.3)
lymph#: 1.8 10*3/uL (ref 0.9–3.3)
nRBC: 0 % (ref 0–0)

## 2015-09-22 NOTE — Progress Notes (Signed)
From 0945 to 1000, one unit 500 mls phlebotomy completed in Rt AC site via 16 gauge needle. Pt tolerated procedure well. Ate snack of soup and crackers.

## 2015-09-22 NOTE — Patient Instructions (Signed)

## 2015-09-28 ENCOUNTER — Telehealth: Payer: Self-pay | Admitting: *Deleted

## 2015-09-28 DIAGNOSIS — D7581 Myelofibrosis: Secondary | ICD-10-CM

## 2015-09-28 MED ORDER — HYDROXYUREA 500 MG PO CAPS: 500.0000 mg | ORAL_CAPSULE | Freq: Two times a day (BID) | ORAL | Status: DC

## 2015-09-28 NOTE — Telephone Encounter (Signed)
"  My pharmacy said they've requested Hydrea refill and not received a response.  Could I have Hydrea refill sent to [facility] at [facility]."

## 2015-10-05 ENCOUNTER — Ambulatory Visit (INDEPENDENT_AMBULATORY_CARE_PROVIDER_SITE_OTHER): Payer: Medicare Other | Admitting: *Deleted

## 2015-10-05 DIAGNOSIS — I639 Cerebral infarction, unspecified: Secondary | ICD-10-CM | POA: Diagnosis not present

## 2015-10-05 NOTE — Progress Notes (Signed)
Carelink Summary Report / Loop Recorder 

## 2015-10-07 ENCOUNTER — Ambulatory Visit (INDEPENDENT_AMBULATORY_CARE_PROVIDER_SITE_OTHER): Payer: Medicare Other | Admitting: Neurology

## 2015-10-07 ENCOUNTER — Encounter: Payer: Self-pay | Admitting: Neurology

## 2015-10-07 ENCOUNTER — Encounter: Payer: Self-pay | Admitting: Internal Medicine

## 2015-10-07 VITALS — BP 132/80 | HR 85 | Ht 67.0 in | Wt 199.0 lb

## 2015-10-07 DIAGNOSIS — Z8673 Personal history of transient ischemic attack (TIA), and cerebral infarction without residual deficits: Secondary | ICD-10-CM

## 2015-10-07 DIAGNOSIS — G40209 Localization-related (focal) (partial) symptomatic epilepsy and epileptic syndromes with complex partial seizures, not intractable, without status epilepticus: Secondary | ICD-10-CM

## 2015-10-07 MED ORDER — LEVETIRACETAM 500 MG PO TABS: ORAL_TABLET | ORAL | Status: DC

## 2015-10-07 NOTE — Patient Instructions (Signed)
1. Continue Keppra 500mg : Take 1-1/2 tablets twice a day 2. Follow-up in 6 months, call for any changes 3. Have a happy new year!!  Seizure Precautions: 1. If medication has been prescribed for you to prevent seizures, take it exactly as directed.  Do not stop taking the medicine without talking to your doctor first, even if you have not had a seizure in a long time.   2. Avoid activities in which a seizure would cause danger to yourself or to others.  Don't operate dangerous machinery, swim alone, or climb in high or dangerous places, such as on ladders, roofs, or girders.  Do not drive unless your doctor says you may.  3. If you have any warning that you may have a seizure, lay down in a safe place where you can't hurt yourself.    4.  No driving for 6 months from last seizure, as per [facility].   Please refer to the following link on the [place] website for more information: http://[email or web address].org/[email or web address]/driving/drivingu.cfm   5.  Maintain good sleep hygiene. Avoid alcohol.  6.  Contact your doctor if you have any problems that may be related to the medicine you are taking.  7.  Call 911 and bring the patient back to the ED if:        A.  The seizure lasts longer than 5 minutes.       B.  The patient doesn't awaken shortly after the seizure  C.  The patient has new problems such as difficulty seeing, speaking or moving  D.  The patient was injured during the seizure  E.  The patient has a temperature over 102 F (39C)  F.  The patient vomited and now is having trouble breathing

## 2015-10-07 NOTE — Progress Notes (Signed)
NEUROLOGY FOLLOW UP OFFICE NOTE  Tyler Whitehead [id number]  HISTORY OF PRESENT ILLNESS: I had the pleasure of seeing Tyler Whitehead in follow-up in the neurology clinic on 10/07/2015.  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 In April 2016, he was reporting a sensation of something crawling under his forehead, head CT unremarkable, unable to do MRI due to loop recorder. These feelings self-resolved after 3 weeks. Last visit, he reported intermittent visual changes over the right visual field where he sees a black spot like a butterfly or bat, seen even when covering each eye. At times it got to the center of his vision, but is mostly on the right hemi-field and when he looks to the right. He also noticed that at night, he sees a reflection of a light even when it is dark. Repeat head CT no acute changes. Keppra dose increased to 768m BID.   He denies any seizures since October 2015. He is tolerating Keppra 7534mBID without side effects. He has seen his eye doctor and has been told the visual symptoms are due to floaters. He is reporting bilateral shoulder and right hip pain, no falls. He reports occasional tremors, none in the office today. He denies any headaches, dizziness, diplopia, focal numbness/tingling.   HPI: This is a pleasant [age]o RH man with a history of hypertension, hyperlipidemia, diabetes, CAD s/p CABG and PTCA, peripheral vascular disease s/p fempop bypass, myelofibrosis/myeloproliferative disorder with polycythemia, with new onset seizure last 07/09/14. He reports that he started having headaches around a month prior, with dull pain over the left frontal region. [facility] after his Cardiology and Oncology visits, head CT normal, headache improved with narcotics. He had reported seeing spots in his  vision and was scheduled to see an ophthalmologist. He had a normal exam and returned to the ER on 09/28 again for constant left frontal headaches that progressively worsened. He had some nausea and vomiting a few days prior. He was evaluated by Neurology with normal exam. ESR and CRP negative. Headache resolved with dilaudid, Ativan, Reglan, and morphine, and he was discharged home on steroid taper to prevent rebound headaches. He again returned to the ER on 09/30 with the same headache, exam non-focal. He had intermittent blurred vision but appeared chronic. He was given a cocktail again with resolution of headaches. On 07/09/14, his wife noticed that throughout the day, he kept looking to the right side. He reported inability to see on the right side. At dinner time, he recalls sitting at the table eating, he recalls seeing lights in his right visual field and little black spots like ants, then woke up in the hospital. His wife reported that he got up, then his mouth drooped, his right hand started shaking, then he started having a GTC and fell to the floor. The seizure lasted several minutes. He was initially confused on arrival to the ER then subsequently returned to normal, with initial neurological exam showing dense right homonymous hemianopia. He denied any focal numbness, tingling, or weakness.   I personally reviewed MRI brain which showed an acute left occipital infarct. MRA showed intracranial atherosclerosis without flow-reducing lesion, unremarkable extracranial MRA. He had been taking aspirin and Plavix since stent placement a year ago, this was not changed during his hospitalization. He had  a TTE and TEE which showed mild fixed plaque in the thoracic aorta. He had an implantable loop recorder placed. He does report occasional palpitations, no chest pains. Routine EEG normal. He reports that for a week after hospital discharge, he continued to see flashing lights and black spots in his right  visual field, this gradually improved, and he currently denies any abnormality in his vision. He denies any further headaches.   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 Otherwise he had a normal birth and early development. There is no history of febrile convulsions, CNS infections such as meningitis/encephalitis, significant traumatic brain injury, neurosurgical procedures, or family history of seizures  PAST MEDICAL HISTORY: Past Medical History  Diagnosis Date  . 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[place])   . Diverticular disease   . Hemorrhoid   . CAD (coronary artery disease)     a. S/p 3V CABG 2000 with LIMA-LAD, left radial-PDA, SVG-OM1. b. 2014: stent to SVG-OM1; then found to have obstruction distal to LAD anastamosis of LIMA s/p DES.   Marland Kitchen Peripheral vascular disease ([place])     a. R femoropopliteal bypass ~2012 in [place], reportedly shown to be occluded. Now followed by Dr. [name].  . DM (diabetes mellitus) ([place])   . Hyperlipidemia   . HTN (hypertension)   . RBBB     MEDICATIONS: Current Outpatient Prescriptions on File Prior to Visit  Medication Sig Dispense Refill  . aspirin EC 81 MG tablet Take 81 mg by mouth daily.    Marland Kitchen atorvastatin (LIPITOR) 20 MG tablet Take 20 mg by mouth daily. Takes along with a 40 mg tablet daily    . atorvastatin (LIPITOR) 40 MG tablet Take 1 tablet (40 mg total) by mouth daily. 90 tablet 3  . carvedilol (COREG) 6.25 MG tablet Take 1 tablet (6.25 mg total) by mouth 2 (two) times daily with a meal. 180 tablet 3  . cholecalciferol (VITAMIN D) 1000 UNITS tablet Take 1,000 Units by mouth daily.    . clopidogrel (PLAVIX) 75 MG tablet Take 1 tablet (75 mg total) by mouth daily with breakfast. 30 tablet 11  . fluticasone (FLONASE) 50 MCG/ACT nasal spray Place 1 spray into both nostrils daily. 16 g 6  . HYDROcodone-acetaminophen (NORCO/VICODIN) 5-325 MG per tablet Take 1 tablet by mouth every 6 (six) hours as needed. 15 tablet 0  . hydroxyurea (HYDREA) 500 MG capsule  Take 1 capsule (500 mg total) by mouth 2 (two) times daily. May take with food to minimize GI side effects. 60 capsule 1  . Insulin Glargine (LANTUS SOLOSTAR) 100 UNIT/ML Solostar Pen Inject 10 Units into the skin 2 (two) times daily. (Patient taking differently: Inject 22 Units into the skin 2 (two) times daily. ) 15 mL 12  . insulin lispro (HUMALOG) 100 UNIT/ML injection Inject 4 Units into the skin 3 (three) times daily before meals.     . isosorbide mononitrate (IMDUR) 60 MG 24 hr tablet Take 60-120 mg by mouth 2 (two) times daily. 2 in the morning and 1 in the evening    . levETIRAcetam (KEPPRA) 500 MG tablet Take 1 and 1/2 tablets by mouth twice daily    . Linagliptin-Metformin HCl 2.5-500 MG TABS Take 1 tablet by mouth 2 (two) times daily.    Marland Kitchen PROAIR HFA 108 (90 BASE) MCG/ACT inhaler Inhale 1 puff into the lungs every 4 (four) hours as needed for wheezing or shortness of breath.     . nitroGLYCERIN (NITROSTAT) 0.4 MG SL tablet Place 1 tablet (0.4 mg  total) under the tongue every 5 (five) minutes as needed for chest pain. (Patient not taking: Reported on 10/07/2015) 25 tablet 12   No current facility-administered medications on file prior to visit.    ALLERGIES: No Known Allergies  FAMILY HISTORY: Family History  Problem Relation Age of Onset  . Hyperlipidemia Maternal Grandmother   . Asthma Father   . Heart disease Father   . CAD      Multiple siblings    SOCIAL HISTORY: Social History   Social History  . Marital Status: Married    Spouse Name: N/A  . Number of Children: N/A  . Years of Education: N/A   Occupational History  . Limo driver    Social History Main Topics  . Smoking status: Never Smoker   . Smokeless tobacco: Never Used  . Alcohol Use: No  . Drug Use: No  . Sexual Activity: Not on file   Other Topics Concern  . Not on file   Social History Narrative   Lives with wife.     REVIEW OF SYSTEMS: Constitutional: No fevers, chills, or sweats, no  generalized fatigue, change in appetite Eyes: No visual changes, double vision, eye pain Ear, nose and throat: No hearing loss, ear pain, nasal congestion, sore throat Cardiovascular: No chest pain, palpitations Respiratory:  No shortness of breath at rest or with exertion, wheezes GastrointestinaI: No nausea, vomiting, diarrhea, abdominal pain, fecal incontinence Genitourinary:  No dysuria, urinary retention or frequency Musculoskeletal:  No neck pain, back pain; +right hip pain Integumentary: No rash, pruritus, skin lesions Neurological: as above Psychiatric: No depression, insomnia, anxiety Endocrine: No palpitations, fatigue, diaphoresis, mood swings, change in appetite, change in weight, increased thirst Hematologic/Lymphatic:  No anemia, purpura, petechiae. Allergic/Immunologic: no itchy/runny eyes, nasal congestion, recent allergic reactions, rashes  PHYSICAL EXAM: Filed Vitals:   10/07/15 0807  BP: 132/80  Pulse: 85   General: No acute distress Head:  Normocephalic/atraumatic Neck: supple, no paraspinal tenderness, full range of motion Heart:  Regular rate and rhythm Lungs:  Clear to auscultation bilaterally Back: No paraspinal tenderness Skin/Extremities: No rash, no edema Neurological Exam: alert and oriented to person, place, and time. No aphasia or dysarthria. Fund of knowledge is appropriate.  Recent and remote memory are intact.  Attention and concentration are normal.    Able to name objects and repeat phrases. Cranial nerves: Pupils equal, round, reactive to light.  Extraocular movements intact with no nystagmus. Visual fields full. Facial sensation intact. No facial asymmetry. Tongue, uvula, palate midline.  Motor: Bulk and tone normal, muscle strength 5/5 throughout with no pronator drift.  Sensation to light touch intact.  No extinction to double simultaneous stimulation.  Deep tendon reflexes 2+ throughout, toes downgoing.  Finger to nose testing intact.  Gait slow and  cautious reporting right hip pain, unable to tandem walk. Romberg negative.  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 No further seizures since October 2015. He is tolerating Keppra 721m BID without side effects. Continue aspirin and Plavix, as well as control of vascular risk factors for secondary stroke prevention. He is aware of [place] driving laws to stop driving after a seizure, until 6 months seizure-free. He knows to go to the ER immediately for any change in symptoms. He will discuss shoulder and  hip pain with PCP. He will follow-up in 6 months and knows to call our office for any problems in the interim.  Thank you for allowing me to participate in his care.  Please do not hesitate to call for any questions or concerns.  The duration of this appointment visit was 24 minutes of face-to-face time with the patient.  Greater than 50% of this time was spent in counseling, explanation of diagnosis, planning of further management, and coordination of care.   Tyler Whitehead, M.D.   CC: Dr. Sheryn Bison

## 2015-10-09 HISTORY — PX: OTHER SURGICAL HISTORY: SHX169

## 2015-10-15 LAB — CUP PACEART REMOTE DEVICE CHECK: Date Time Interrogation Session: 20161128194016

## 2015-10-20 ENCOUNTER — Ambulatory Visit (HOSPITAL_BASED_OUTPATIENT_CLINIC_OR_DEPARTMENT_OTHER): Payer: Medicare Other

## 2015-10-20 ENCOUNTER — Ambulatory Visit: Payer: Medicare Other

## 2015-10-20 DIAGNOSIS — D7581 Myelofibrosis: Secondary | ICD-10-CM

## 2015-10-20 DIAGNOSIS — D45 Polycythemia vera: Secondary | ICD-10-CM

## 2015-10-20 LAB — CBC & DIFF AND RETIC
BASO%: 1 % (ref 0.0–2.0)
Basophils Absolute: 0.2 10*3/uL — ABNORMAL HIGH (ref 0.0–0.1)
EOS%: 2.9 % (ref 0.0–7.0)
Eosinophils Absolute: 0.6 10*3/uL — ABNORMAL HIGH (ref 0.0–0.5)
HCT: 43.2 % (ref 38.4–49.9)
HGB: 12.3 g/dL — ABNORMAL LOW (ref 13.0–17.1)
Immature Retic Fract: 15.5 % — ABNORMAL HIGH (ref 3.00–10.60)
LYMPH%: 10.3 % — ABNORMAL LOW (ref 14.0–49.0)
MCH: 20.6 pg — ABNORMAL LOW (ref 27.2–33.4)
MCHC: 28.5 g/dL — ABNORMAL LOW (ref 32.0–36.0)
MCV: 72.2 fL — ABNORMAL LOW (ref 79.3–98.0)
MONO#: 0.8 10*3/uL (ref 0.1–0.9)
MONO%: 3.9 % (ref 0.0–14.0)
NEUT#: 17.2 10*3/uL — ABNORMAL HIGH (ref 1.5–6.5)
NEUT%: 81.9 % — ABNORMAL HIGH (ref 39.0–75.0)
Platelets: 469 10*3/uL — ABNORMAL HIGH (ref 140–400)
RBC: 5.98 10*6/uL — ABNORMAL HIGH (ref 4.20–5.82)
RDW: 17.7 % — ABNORMAL HIGH (ref 11.0–14.6)
Retic %: 1.67 % (ref 0.80–1.80)
Retic Ct Abs: 99.87 10*3/uL — ABNORMAL HIGH (ref 34.80–93.90)
WBC: 20.9 10*3/uL — ABNORMAL HIGH (ref 4.0–10.3)
lymph#: 2.2 10*3/uL (ref 0.9–3.3)
nRBC: 0 % (ref 0–0)

## 2015-10-20 LAB — TECHNOLOGIST REVIEW

## 2015-10-20 NOTE — Progress Notes (Signed)
Pt HCT 43.2. Notified Dr. Burr Medico and pt will not need any phlebotomy today.Printed pt calendar appt. Pt verbalized understanding and aware of upcoming appts.

## 2015-10-26 DIAGNOSIS — I70293 Other atherosclerosis of native arteries of extremities, bilateral legs: Secondary | ICD-10-CM | POA: Diagnosis not present

## 2015-10-26 DIAGNOSIS — E1351 Other specified diabetes mellitus with diabetic peripheral angiopathy without gangrene: Secondary | ICD-10-CM | POA: Diagnosis not present

## 2015-10-26 DIAGNOSIS — L602 Onychogryphosis: Secondary | ICD-10-CM | POA: Diagnosis not present

## 2015-10-28 ENCOUNTER — Other Ambulatory Visit (HOSPITAL_BASED_OUTPATIENT_CLINIC_OR_DEPARTMENT_OTHER): Payer: Medicare Other

## 2015-10-28 ENCOUNTER — Ambulatory Visit (HOSPITAL_BASED_OUTPATIENT_CLINIC_OR_DEPARTMENT_OTHER): Payer: Medicare Other | Admitting: Hematology

## 2015-10-28 ENCOUNTER — Telehealth: Payer: Self-pay | Admitting: Hematology

## 2015-10-28 ENCOUNTER — Encounter: Payer: Self-pay | Admitting: Hematology

## 2015-10-28 VITALS — BP 146/54 | HR 93 | Temp 98.2°F | Resp 18 | Ht 67.0 in | Wt 203.9 lb

## 2015-10-28 DIAGNOSIS — D7581 Myelofibrosis: Secondary | ICD-10-CM

## 2015-10-28 DIAGNOSIS — I10 Essential (primary) hypertension: Secondary | ICD-10-CM

## 2015-10-28 DIAGNOSIS — R079 Chest pain, unspecified: Secondary | ICD-10-CM | POA: Diagnosis not present

## 2015-10-28 DIAGNOSIS — I25810 Atherosclerosis of coronary artery bypass graft(s) without angina pectoris: Secondary | ICD-10-CM

## 2015-10-28 DIAGNOSIS — D45 Polycythemia vera: Secondary | ICD-10-CM

## 2015-10-28 DIAGNOSIS — I739 Peripheral vascular disease, unspecified: Secondary | ICD-10-CM | POA: Diagnosis not present

## 2015-10-28 DIAGNOSIS — Z8673 Personal history of transient ischemic attack (TIA), and cerebral infarction without residual deficits: Secondary | ICD-10-CM | POA: Diagnosis not present

## 2015-10-28 DIAGNOSIS — I2510 Atherosclerotic heart disease of native coronary artery without angina pectoris: Secondary | ICD-10-CM

## 2015-10-28 LAB — CBC & DIFF AND RETIC
BASO%: 1.1 % (ref 0.0–2.0)
Basophils Absolute: 0.2 10*3/uL — ABNORMAL HIGH (ref 0.0–0.1)
EOS%: 3.8 % (ref 0.0–7.0)
Eosinophils Absolute: 0.8 10*3/uL — ABNORMAL HIGH (ref 0.0–0.5)
HCT: 42.3 % (ref 38.4–49.9)
HGB: 12.2 g/dL — ABNORMAL LOW (ref 13.0–17.1)
Immature Retic Fract: 25.5 % — ABNORMAL HIGH (ref 3.00–10.60)
LYMPH%: 8.8 % — ABNORMAL LOW (ref 14.0–49.0)
MCH: 20.6 pg — ABNORMAL LOW (ref 27.2–33.4)
MCHC: 28.8 g/dL — ABNORMAL LOW (ref 32.0–36.0)
MCV: 71.6 fL — ABNORMAL LOW (ref 79.3–98.0)
MONO#: 1.1 10*3/uL — ABNORMAL HIGH (ref 0.1–0.9)
MONO%: 5.5 % (ref 0.0–14.0)
NEUT#: 16.6 10*3/uL — ABNORMAL HIGH (ref 1.5–6.5)
NEUT%: 80.8 % — ABNORMAL HIGH (ref 39.0–75.0)
Platelets: 519 10*3/uL — ABNORMAL HIGH (ref 140–400)
RBC: 5.91 10*6/uL — ABNORMAL HIGH (ref 4.20–5.82)
RDW: 17.8 % — ABNORMAL HIGH (ref 11.0–14.6)
Retic %: 1.72 % (ref 0.80–1.80)
Retic Ct Abs: 101.65 10*3/uL — ABNORMAL HIGH (ref 34.80–93.90)
WBC: 20.6 10*3/uL — ABNORMAL HIGH (ref 4.0–10.3)
lymph#: 1.8 10*3/uL (ref 0.9–3.3)
nRBC: 0 % (ref 0–0)

## 2015-10-28 LAB — COMPREHENSIVE METABOLIC PANEL
ALT: 18 U/L (ref 0–55)
AST: 18 U/L (ref 5–34)
Albumin: 3.7 g/dL (ref 3.5–5.0)
Alkaline Phosphatase: 110 U/L (ref 40–150)
Anion Gap: 7 mEq/L (ref 3–11)
BUN: 12.9 mg/dL (ref 7.0–26.0)
CO2: 29 mEq/L (ref 22–29)
Calcium: 9.2 mg/dL (ref 8.4–10.4)
Chloride: 105 mEq/L (ref 98–109)
Creatinine: 1.1 mg/dL (ref 0.7–1.3)
EGFR: 76 mL/min/{1.73_m2} — ABNORMAL LOW (ref >90)
Glucose: 196 mg/dl — ABNORMAL HIGH (ref 70–140)
Potassium: 4.1 mEq/L (ref 3.5–5.1)
Sodium: 142 mEq/L (ref 136–145)
Total Bilirubin: 0.46 mg/dL (ref 0.20–1.20)
Total Protein: 6.6 g/dL (ref 6.4–8.3)

## 2015-10-28 NOTE — Telephone Encounter (Signed)
Gave patient avs report and appointments for February.  °

## 2015-10-28 NOTE — Progress Notes (Signed)
[place] OFFICE PROGRESS NOTE  [name], MD [phone] [address], [address] [place] [address]  DIAGNOSIS: Myelofibrosis/Myeloproliferative disorder (Overlap). JAK2+.  CHIEF COMPLAINS: follow up   CURRENT TREATMENT:    1.Hydrea 750 mg twice daily. 2. Aspirin daily. 3. Therapeutic phlebotomy if hematocrit more than 45%    Myelofibrosis ([place])   12/26/2012 Bone Marrow Biopsy Showed hypercellularity with reticulum fibrosis, JAK-2 mutation positive, BCR/ABL negative, 74 XY in 20 metaphase, and Intermediate-1 ,DIPSS of 2. (WBC < 25; Hbg > 10;    01/02/2013 Imaging US abdomen complete revealed Mild splenogmealy (13.3 cm) and a 2.2 x 1.7 x 1.8 cm left renal cyst   01/15/2013 Pathology Results Integrated hematopathology report.  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    03/17/2013 Imaging CT Chest: Crowded peribronchial markings noted, associated with linear densities involving the left base, probably representing areas of platelike atelectasis/scarring.  No mass or infiltrate seen.    03/19/2013 - 07/20/2013 Chemotherapy Started Jakafi 20 mg bid. Dose reduced due to anemia.  Stopped due to increasing fatigue while on jakafi.    04/28/2013 - 05/01/2013 Hospital Admission Admitted to [facility] by Dr. Francia Greaves, his cardiologist, due to the fact that he had midsternal chest pain.  He had a coronary stent placed this admission.    05/28/2013 - 05/29/2013 Hospital Admission Admitted to [facility] and received 3 units of blood (Dr. Lendon Colonel note).    06/10/2013 Surgery Cololonoscopy due to GIB.  C/w diverticular disease and hemorrhoids but no active bleeding.    10/05/2013 -  Chemotherapy Started Hydrea 500 mg daily.   03/19/2014 -  Chemotherapy Hydrea increased to 500 mg bid wbc 17, hb 17, hct 54, plt 437   06/29/2014 Procedure cbc shows wbc 18.8 hb 18.7 hct 58.9 plt 456. ?compliance w hydrea as MCV not elevated. symptomatic start TP weekly x 4   07/28/2014 Procedure TP every 2 weeks if hct > 45, changed to monthly in April 2016     INTERVAL HISTORY:  Tyler Whitehead 75 y.o. male with myleofibrosis who returns follow up. He is accompanied by his daughter to clinic today. He reports 2 episodes of right-sided chest pain in the past 2 days, happened at night  When he was in the bed , no dyspnea, no tenderness, he took Tylenol and the pain resolved.  He complains  About worsening fatigue, some weight gain (4 lbs in the past month),  And abdominal bloating. He has occasional chills, no fever, no nausea. His appetite has been low, foot does not taste good, but he still eats well overall.  MEDICAL HISTORY: Past Medical History  Diagnosis Date  . 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)   . Diverticular disease   . Hemorrhoid   . CAD (coronary artery disease)     a. S/p 3V CABG 2000 with LIMA-LAD, left radial-PDA, SVG-OM1. b. 2014: stent to SVG-OM1; then found to have obstruction distal to LAD anastamosis of LIMA s/p DES.   Marland Kitchen Peripheral vascular disease ([place])     a. R femoropopliteal bypass ~2012 in [place], reportedly shown to be occluded. Now followed by Dr. [name].  . DM (diabetes mellitus) ([place])   . Hyperlipidemia   . HTN (hypertension)   . RBBB      ALLERGIES:  has No Known Allergies.  MEDICATIONS: has a current medication list which includes the following prescription(s): aspirin ec, atorvastatin, atorvastatin,  carvedilol, cholecalciferol, clopidogrel, fluticasone, hydroxyurea, insulin glargine, insulin lispro, isosorbide mononitrate, levetiracetam, linagliptin-metformin hcl, metoclopramide, proair hfa, hydrocodone-acetaminophen, and nitroglycerin.  SURGICAL HISTORY:  Past Surgical History  Procedure Laterality Date  . Coronary artery bypass graft  2000    LM occluded, LIMA to LAD patent but distal native  LAD disease, saphenous vein bypass graft to OM with 95% stenosis in the midportion, right coronary artery occluded, radial artery graft to right coronary artery patent but was diffusely small. Marginal graft stented. 7 2014. Of note he had multiple previous stents in the right coronary artery. The distal LAD was treated with angioplasty. I  . Cardiac catheterization  May 2014  . Percutaneous coronary stent intervention (pci-s)  April 27 2013  . Vein surgery    . Tee without cardioversion N/A 07/12/2014    Procedure: TRANSESOPHAGEAL ECHOCARDIOGRAM (TEE);  Surgeon: Fay Records, MD;  Location: [facility] ENDOSCOPY;  Service: Cardiovascular;  Laterality: N/A;  . Loop recorder implant N/A 07/12/2014    Procedure: LOOP RECORDER IMPLANT;  Surgeon: Evans Lance, MD;  Location[facility] CATH LAB;  Service: Cardiovascular;  Laterality: N/A;  . Abdominal aortagram N/A 09/21/2014    Procedure: ABDOMINAL Maxcine Ham;  Surgeon: Elam Dutch, MD;  Location: [facility] CATH LAB;  Service: Cardiovascular;  Laterality: N/A;  . Lower extremity angiogram  09/21/2014    Procedure: LOWER EXTREMITY ANGIOGRAM;  Surgeon: Elam Dutch, MD;  Location: [facility] CATH LAB;  Service: Cardiovascular;;  . Abdominal angiogram N/A 01/18/2015    Procedure: ABDOMINAL ANGIOGRAM;  Surgeon: Serafina Mitchell, MD;  Location: [facility] CATH LAB;  Service: Cardiovascular;  Laterality: N/A;     Medication List       This list is accurate as of: 10/28/15  4:19 PM.  Always use your most recent med list.               aspirin EC 81 MG tablet  Take 81 mg by mouth daily.     atorvastatin 20 MG tablet  Commonly known as:  LIPITOR  Take 20 mg by mouth daily. Takes along with a 40 mg tablet daily     atorvastatin 40 MG tablet  Commonly known as:  LIPITOR  Take 1 tablet (40 mg total) by mouth daily.     carvedilol 6.25 MG tablet  Commonly known as:  COREG  Take 1 tablet (6.25 mg total) by mouth 2 (two) times daily with a meal.     cholecalciferol 1000 units  tablet  Commonly known as:  VITAMIN D  Take 1,000 Units by mouth daily.     clopidogrel 75 MG tablet  Commonly known as:  PLAVIX  Take 1 tablet (75 mg total) by mouth daily with breakfast.     fluticasone 50 MCG/ACT nasal spray  Commonly known as:  FLONASE  Place 1 spray into both nostrils daily.     HYDROcodone-acetaminophen 5-325 MG tablet  Commonly known as:  NORCO/VICODIN  Take 1 tablet by mouth every 6 (six) hours as needed.     hydroxyurea 500 MG capsule  Commonly known as:  HYDREA  Take 1 capsule (500 mg total) by mouth 2 (two) times daily. May take with food to minimize GI side effects.     Insulin Glargine 100 UNIT/ML Solostar Pen  Commonly known as:  LANTUS SOLOSTAR  Inject 10 Units into the skin 2 (two) times daily.     insulin lispro 100 UNIT/ML injection  Commonly known as:  HUMALOG  Inject 4 Units into the  skin 3 (three) times daily before meals.     isosorbide mononitrate 60 MG 24 hr tablet  Commonly known as:  IMDUR  Take 60-120 mg by mouth 2 (two) times daily. 2 in the morning and 1 in the evening     levETIRAcetam 500 MG tablet  Commonly known as:  KEPPRA  Take 1 and 1/2 tablets by mouth twice daily     Linagliptin-Metformin HCl 2.5-500 MG Tabs  Take 1 tablet by mouth 2 (two) times daily.     metoCLOPramide 10 MG tablet  Commonly known as:  REGLAN  10 mg. Take 1 tablet 3 times a day     nitroGLYCERIN 0.4 MG SL tablet  Commonly known as:  NITROSTAT  Place 1 tablet (0.4 mg total) under the tongue every 5 (five) minutes as needed for chest pain.     PROAIR HFA 108 (90 Base) MCG/ACT inhaler  Generic drug:  albuterol  Inhale 1 puff into the lungs every 4 (four) hours as needed for wheezing or shortness of breath.        REVIEW OF SYSTEMS:   Constitutional: Denies fevers, chills or abnormal weight loss, (+) fatigue  Eyes: Admits to blurriness of vision and floaters Ears, nose, mouth, throat, and face: Denies mucositis or sore throat Respiratory:  Denies cough, dyspnea or wheezes Cardiovascular: Denies palpitation, chest discomfort or lower extremity swelling Gastrointestinal:  Denies nausea, heartburn or change in bowel habits Skin: Denies abnormal skin rashes Lymphatics: Denies new lymphadenopathy or easy bruising Neurological:Denies numbness, tingling or new weaknesses Behavioral/Psych: Mood is stable, no new changes  All other systems were reviewed with the patient and are negative.  PHYSICAL EXAMINATION: ECOG PERFORMANCE STATUS: 1  Blood pressure 146/54, pulse 93, temperature 98.2 F (36.8 C), temperature source Oral, resp. rate 18, height '5\' 7"'  (1.702 m), weight 203 lb 14.4 oz (92.488 kg), SpO2 100 %.  GENERAL:alert, no distress and comfortable; elderly male who appears his stated age.  SKIN: skin color, texture, turgor are normal, no rashes or significant lesions EYES: normal, Conjunctiva are pink and non-injected, sclera clear OROPHARYNX:no exudate, no erythema and lips, buccal mucosa, and tongue normal  NECK: supple, thyroid normal size, non-tender, without nodularity LYMPH:  no palpable lymphadenopathy in the cervical, axillary or supraclavicular LUNGS: clear to auscultation with normal breathing effort, no wheezes or rhonchi HEART: regular rate & rhythm and no murmurs and no lower extremity edema ABDOMEN:abdomen soft, non-tender and normal bowel sounds, mild splenmagely, about 3-4 cm below rib cage, non-tender  Musculoskeletal:no cyanosis of digits and no clubbing, no significant tenderness at the spine or paraspinal area. NEURO: alert & oriented x 3 with fluent speech, no focal motor/sensory deficits  Labs:  CBC Latest Ref Rng 10/28/2015 10/20/2015 09/22/2015  WBC 4.0 - 10.3 10e3/uL 20.6(H) 20.9(H) 16.7(H)  Hemoglobin 13.0 - 17.1 g/dL 12.2(L) 12.3(L) 13.5  Hematocrit 38.4 - 49.9 % 42.3 43.2 46.6  Platelets 140 - 400 10e3/uL 519(H) 469(H) 333    CMP Latest Ref Rng 10/28/2015 07/28/2015 02/16/2015  Glucose 70 - 140  mg/dl 196(H) 128 159(H)  BUN 7.0 - 26.0 mg/dL 12.9 10.3 9  Creatinine 0.7 - 1.3 mg/dL 1.1 1.0 0.93  Sodium 136 - 145 mEq/L 142 143 140  Potassium 3.5 - 5.1 mEq/L 4.1 3.9 4.1  Chloride 101 - 111 mmol/L - - 105  CO2 22 - 29 mEq/L '29 27 28  ' Calcium 8.4 - 10.4 mg/dL 9.2 9.1 9.1  Total Protein 6.4 - 8.3 g/dL 6.6 6.0(L) -  Total Bilirubin 0.20 - 1.20 mg/dL 0.46 0.44 -  Alkaline Phos 40 - 150 U/L 110 101 -  AST 5 - 34 U/L 18 12 -  ALT 0 - 55 U/L 18 <9 -     RADIOGRAPHIC STUDIES: CT abdomen and pelvis with contrast on 12/14/2014  IMPRESSION: 1. No acute findings within the abdomen or pelvis. 2. Atherosclerotic disease. 3. Kidney cysts.  ASSESSMENT: Tyler Whitehead 75 y.o. male with a history of Myelofibrosis, jak 2 mutation and symptomatic polycythemia. Wbc and platelets are high as well.   PLAN:   1. Primary Myeloproliferative neoplasm (Myelofibrosis), JAK2 mutation (+), DIPSS 2, intermediate risk-1   -We discussed that this is not curable disease, but treatable. Some patient will develop acute leukemia or aplastic anemia later on. -he has intermediate risk disease, which predicts median survival 6.5 years. -he is not a good candidate for stem cell transplant due to his advanced age and multiple medical comorbidities. -I discussed that he is at high risk for thrombosis secondary to his disease, he will continue aspirin and plavix  -he tried [place] for 4 months in 2014, could not tolerate due to his anemia and fatigue. -We again emphasized the importance of continued Hydrea and normalized his blood counts to prevent further stroke or thrombosis. He agrees to continue Hydrea and compliant with treatment. -he has increased hydrea dose 2 months ago, but no significant change of his low counts. I'll increase his Hydrea to 1 g twice daily, and monitor his CBC closely -check CBC monthly and phlebotomy if HCT>=45% -we also discuss to add anagrelide if his thrombocytosis is not well controlled by  hydrea  2. Right chest pain -Given his significant cardiac history, I recommend him to call his cardiologist as soon as possible, and follow-up with his cardiologist Dr. Percival Spanish for workup. I also sent a message to Dr. Percival Spanish  -he knows to go to emergency room if he has persistent chest pain.   3. CADz s/p CABG x 3, hypertension  --Continue lisinopril, coreg, aspirin and plavix   4. PvDz.  --Continue Imdur as it appearing to have improved some of his claudication symptoms.   5. Dyslipidemia.  --Continue statin therapy.   6. Stroke and seizure in 07/2014 -- follow up with his neurologist   Plan; - increase Hydrea to 1060m bid  -CBC in 2 weeks and I will see him in 4 weeks  [place] I recommend him to find a tertiary cancer center for treatment    All questions were answered. The patient knows to call the clinic with any problems, questions or concerns. We can certainly see the patient much sooner if necessary.  I spent 20 minutes counseling the patient face to face. The total time spent in the appointment was 25 minutes.  FTruitt Merle1/20/2017

## 2015-10-31 ENCOUNTER — Encounter: Payer: Self-pay | Admitting: Cardiology

## 2015-10-31 ENCOUNTER — Ambulatory Visit (INDEPENDENT_AMBULATORY_CARE_PROVIDER_SITE_OTHER): Payer: Medicare Other | Admitting: Cardiology

## 2015-10-31 VITALS — BP 124/52 | HR 83 | Ht 67.0 in | Wt 201.0 lb

## 2015-10-31 DIAGNOSIS — I2510 Atherosclerotic heart disease of native coronary artery without angina pectoris: Secondary | ICD-10-CM | POA: Diagnosis not present

## 2015-10-31 MED ORDER — CARVEDILOL 6.25 MG PO TABS: 6.2500 mg | ORAL_TABLET | Freq: Two times a day (BID) | ORAL | Status: DC

## 2015-10-31 MED ORDER — ISOSORBIDE MONONITRATE ER 60 MG PO TB24: 60.0000 mg | ORAL_TABLET | Freq: Two times a day (BID) | ORAL | Status: DC

## 2015-10-31 MED ORDER — CLOPIDOGREL BISULFATE 75 MG PO TABS: 75.0000 mg | ORAL_TABLET | Freq: Every day | ORAL | Status: DC

## 2015-10-31 NOTE — Progress Notes (Signed)
HPI The patient presents for evaluation of CAD he has had CABG and PVD. His last cath in [place], [place] suggested that "Medical therapy should be enhanced if the patient has ongoing exertional chest discomfort despite maximal medical management and if he has evidence of inferior ischemia on a perfusion study then repeat PCI of the native right coronary artery could be considered. However, given the patient's track record the right coronary artery is not likely to remain open even if PCI could be accomplished."  He is also managed for lower extremity disease as seen by Dr. Trula Slade.  He has had a CVA of unclear etiology had an implantable loop by Dr. Lovena Le.  Since I last saw him he's had no new cardiovascular complaints. He's not require any nitroglycerin. He somewhat limited in his activities because of this but he denies any chest pressure, neck or arm discomfort. He's had no shortness of breath, PND or orthopnea. He's had no weight gain or edema.  He had his device interrogated and was not found to have any episodes of fibrillation. He is having significant stress at home and is moving back to [place].  No Known Allergies  Current Outpatient Prescriptions  Medication Sig Dispense Refill  . aspirin EC 81 MG tablet Take 81 mg by mouth daily.    Marland Kitchen atorvastatin (LIPITOR) 20 MG tablet Take 20 mg by mouth daily. Takes along with a 40 mg tablet daily    . atorvastatin (LIPITOR) 40 MG tablet Take 1 tablet (40 mg total) by mouth daily. 90 tablet 3  . carvedilol (COREG) 6.25 MG tablet Take 1 tablet (6.25 mg total) by mouth 2 (two) times daily with a meal. 180 tablet 3  . cholecalciferol (VITAMIN D) 1000 UNITS tablet Take 1,000 Units by mouth daily.    . clopidogrel (PLAVIX) 75 MG tablet Take 1 tablet (75 mg total) by mouth daily with breakfast. 30 tablet 11  . fluticasone (FLONASE) 50 MCG/ACT nasal spray Place 1 spray into both nostrils daily. 16 g 6  . HYDROcodone-acetaminophen (NORCO/VICODIN) 5-325 MG  per tablet Take 1 tablet by mouth every 6 (six) hours as needed. 15 tablet 0  . hydroxyurea (HYDREA) 500 MG capsule Take 1 capsule (500 mg total) by mouth 2 (two) times daily. May take with food to minimize GI side effects. 60 capsule 1  . Insulin Glargine (LANTUS SOLOSTAR) 100 UNIT/ML Solostar Pen Inject 10 Units into the skin 2 (two) times daily. (Patient taking differently: Inject 22 Units into the skin 2 (two) times daily. ) 15 mL 12  . insulin lispro (HUMALOG) 100 UNIT/ML injection Inject 4 Units into the skin 3 (three) times daily before meals.     . isosorbide mononitrate (IMDUR) 60 MG 24 hr tablet Take 60-120 mg by mouth 2 (two) times daily. 2 in the morning and 1 in the evening    . levETIRAcetam (KEPPRA) 500 MG tablet Take 1 and 1/2 tablets by mouth twice daily 90 tablet 11  . Linagliptin-Metformin HCl 2.5-500 MG TABS Take 1 tablet by mouth 2 (two) times daily.    . metoCLOPramide (REGLAN) 10 MG tablet 10 mg. Take 1 tablet 3 times a day    . nitroGLYCERIN (NITROSTAT) 0.4 MG SL tablet Place 1 tablet (0.4 mg total) under the tongue every 5 (five) minutes as needed for chest pain. 25 tablet 12  . PROAIR HFA 108 (90 BASE) MCG/ACT inhaler Inhale 1 puff into the lungs every 4 (four) hours as needed for wheezing or shortness  of breath.      No current facility-administered medications for this visit.    Past Medical History  Diagnosis Date  . 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[place])   . Diverticular disease   . Hemorrhoid   . CAD (coronary artery disease)     a. S/p 3V CABG 2000 with LIMA-LAD, left radial-PDA, SVG-OM1. b. 2014: stent to SVG-OM1; then found to have obstruction distal to LAD anastamosis of LIMA s/p DES.   Marland Kitchen Peripheral vascular disease ([place])     a. R femoropopliteal bypass ~2012 in [place], reportedly shown to be occluded. Now followed by Dr. [name].  . DM (diabetes mellitus) ([place])   . Hyperlipidemia   . HTN (hypertension)   . RBBB     Past Surgical History  Procedure Laterality Date  .  Coronary artery bypass graft  2000    LM occluded, LIMA to LAD patent but distal native LAD disease, saphenous vein bypass graft to OM with 95% stenosis in the midportion, right coronary artery occluded, radial artery graft to right coronary artery patent but was diffusely small. Marginal graft stented. 7 2014. Of note he had multiple previous stents in the right coronary artery. The distal LAD was treated with angioplasty. I  . Cardiac catheterization  May 2014  . Percutaneous coronary stent intervention (pci-s)  April 27 2013  . Vein surgery    . Tee without cardioversion N/A 07/12/2014    Procedure: TRANSESOPHAGEAL ECHOCARDIOGRAM (TEE);  Surgeon: Fay Records, MD;  Location: [facility] ENDOSCOPY;  Service: Cardiovascular;  Laterality: N/A;  . Loop recorder implant N/A 07/12/2014    Procedure: LOOP RECORDER IMPLANT;  Surgeon: Evans Lance, MD;  Location: [facility] CATH LAB;  Service: Cardiovascular;  Laterality: N/A;  . Abdominal aortagram N/A 09/21/2014    Procedure: ABDOMINAL Maxcine Ham;  Surgeon: Elam Dutch, MD;  Location: [facility] CATH LAB;  Service: Cardiovascular;  Laterality: N/A;  . Lower extremity angiogram  09/21/2014    Procedure: LOWER EXTREMITY ANGIOGRAM;  Surgeon: Elam Dutch, MD;  Location: [facility] CATH LAB;  Service: Cardiovascular;;  . Abdominal angiogram N/A 01/18/2015    Procedure: ABDOMINAL ANGIOGRAM;  Surgeon: Serafina Mitchell, MD;  Location: [facility] CATH LAB;  Service: Cardiovascular;  Laterality: N/A;    ROS:  As stated in the HPI and negative for all other systems.  PHYSICAL EXAM BP 124/52 mmHg  Pulse 83  Ht 5\' 7"  (1.702 m)  Wt 201 lb (91.173 kg)  BMI 31.47 kg/m2 GENERAL:  Well appearing NECK:  No jugular venous distention, waveform within normal limits, carotid upstroke brisk and symmetric, no bruits, no thyromegaly LUNGS:  Clear to auscultation bilaterally BACK:  No CVA tenderness CHEST:  Well healed sternotomy scar. HEART:  PMI not displaced or sustained,S1 and S2 within normal  limits, no S3, no S4, no clicks, no rubs, no murmurs ABD:  Flat, positive bowel sounds normal in frequency in pitch, no bruits, no rebound, no guarding, no midline pulsatile mass, no hepatomegaly, no splenomegaly EXT:  2 plus pulses upper, absent left radial, absent femoral, DP/PT, no edema, no cyanosis no clubbing SKIN:  No rashes no nodules  EKG:  Sinus rhythm, rate 83, axis within normal limits, right bundle branch block, premature ectopic complexes, no acute ST-T wave changes.  10/31/2015  ASSESSMENT AND PLAN  CAD:  He has no active chest pain.  He will continue with risk reduction. No further imaging is indicated.  HTN:  The blood pressure is at target. No change in medications is indicated. We  will continue with therapeutic lifestyle changes (TLC).  DM:   He unfortunately is no longer seeing his endocrinologist will find another one in [place]  PVD:  Per Dr. Trula Slade or whomever he sees in [place].   HYPERLIPIDEMIA:  His LDL was 40 in October.  He will continue with current meds.

## 2015-10-31 NOTE — Patient Instructions (Signed)
You medications have been refilled to the [facility].

## 2015-11-02 ENCOUNTER — Telehealth: Payer: Self-pay | Admitting: Cardiology

## 2015-11-02 NOTE — Telephone Encounter (Signed)
Spoke w/ pt and requested that he send manual transmission w/ home monitor. Pt said he would have his do it.

## 2015-11-04 ENCOUNTER — Ambulatory Visit (INDEPENDENT_AMBULATORY_CARE_PROVIDER_SITE_OTHER): Payer: Medicare Other | Admitting: *Deleted

## 2015-11-04 DIAGNOSIS — I639 Cerebral infarction, unspecified: Secondary | ICD-10-CM | POA: Diagnosis not present

## 2015-11-04 NOTE — Progress Notes (Signed)
Carelink Summary Report / Loop Recorder 

## 2015-11-09 ENCOUNTER — Encounter: Payer: Self-pay | Admitting: *Deleted

## 2015-11-11 ENCOUNTER — Other Ambulatory Visit (HOSPITAL_BASED_OUTPATIENT_CLINIC_OR_DEPARTMENT_OTHER): Payer: Medicare Other

## 2015-11-11 DIAGNOSIS — D7581 Myelofibrosis: Secondary | ICD-10-CM

## 2015-11-11 DIAGNOSIS — D45 Polycythemia vera: Secondary | ICD-10-CM

## 2015-11-11 LAB — CBC & DIFF AND RETIC
BASO%: 1.9 % (ref 0.0–2.0)
Basophils Absolute: 0.1 10*3/uL (ref 0.0–0.1)
EOS%: 3.7 % (ref 0.0–7.0)
Eosinophils Absolute: 0.3 10*3/uL (ref 0.0–0.5)
HCT: 41.7 % (ref 38.4–49.9)
HGB: 12 g/dL — ABNORMAL LOW (ref 13.0–17.1)
Immature Retic Fract: 8 % (ref 3.00–10.60)
LYMPH%: 13.7 % — ABNORMAL LOW (ref 14.0–49.0)
MCH: 20.8 pg — ABNORMAL LOW (ref 27.2–33.4)
MCHC: 28.8 g/dL — ABNORMAL LOW (ref 32.0–36.0)
MCV: 72.3 fL — ABNORMAL LOW (ref 79.3–98.0)
MONO#: 0.3 10*3/uL (ref 0.1–0.9)
MONO%: 3.8 % (ref 0.0–14.0)
NEUT#: 5.7 10*3/uL (ref 1.5–6.5)
NEUT%: 76.9 % — ABNORMAL HIGH (ref 39.0–75.0)
Platelets: 228 10*3/uL (ref 140–400)
RBC: 5.77 10*6/uL (ref 4.20–5.82)
RDW: 18.2 % — ABNORMAL HIGH (ref 11.0–14.6)
Retic %: 0.58 % — ABNORMAL LOW (ref 0.80–1.80)
Retic Ct Abs: 33.47 10*3/uL — ABNORMAL LOW (ref 34.80–93.90)
WBC: 7.4 10*3/uL (ref 4.0–10.3)
lymph#: 1 10*3/uL (ref 0.9–3.3)

## 2015-11-12 LAB — CUP PACEART REMOTE DEVICE CHECK: Date Time Interrogation Session: 20161228200805

## 2015-11-23 ENCOUNTER — Encounter: Payer: Self-pay | Admitting: Internal Medicine

## 2015-11-23 NOTE — Telephone Encounter (Signed)
Manual transmission successfully received.  Episodes printed for review by Dr. Lovena Le when he is in office next on 11/24/15.

## 2015-11-23 NOTE — Telephone Encounter (Signed)
Called patient to make him aware that manual transmission has still not been received.  Assisted patient in transmitting today, unable to transmit successfully due to a connection issue with the [employer].  Gave patient [employer] tech services phone number and requested that he call later today for assistance.  Patient states he will call after lunch.  Patient reports he will be moving to [facility]. at the beginning of next month and he is unsure what to do with his monitor.  Advised patient to bring his monitor with him and to plug it in at his new residence so that his ILR can continue to be monitored.  Advised that when he establishes with a new neurologist and cardiologist/EP, they will request the transfer in [place].  Patient verbalizes understanding of these instructions.  He is aware of his upcoming appointment with Dr. Lovena Le on 12/05/15.  Patient is appreciative of call and denies additional questions or concerns at this time.

## 2015-11-24 ENCOUNTER — Telehealth: Payer: Self-pay | Admitting: *Deleted

## 2015-11-24 ENCOUNTER — Ambulatory Visit (HOSPITAL_COMMUNITY)
Admission: RE | Admit: 2015-11-24 | Discharge: 2015-11-24 | Disposition: A | Discharge status: Home / Self Care | Payer: Medicare Other | Source: Ambulatory Visit | Attending: Hematology | Admitting: Hematology

## 2015-11-24 ENCOUNTER — Other Ambulatory Visit (HOSPITAL_BASED_OUTPATIENT_CLINIC_OR_DEPARTMENT_OTHER): Payer: Medicare Other

## 2015-11-24 ENCOUNTER — Encounter: Payer: Self-pay | Admitting: Hematology

## 2015-11-24 ENCOUNTER — Encounter (HOSPITAL_COMMUNITY): Payer: Self-pay

## 2015-11-24 ENCOUNTER — Ambulatory Visit (HOSPITAL_BASED_OUTPATIENT_CLINIC_OR_DEPARTMENT_OTHER): Payer: Medicare Other | Admitting: Hematology

## 2015-11-24 VITALS — BP 124/61 | HR 92 | Temp 98.1°F | Resp 18 | Ht 67.0 in | Wt 197.8 lb

## 2015-11-24 DIAGNOSIS — Z8673 Personal history of transient ischemic attack (TIA), and cerebral infarction without residual deficits: Secondary | ICD-10-CM

## 2015-11-24 DIAGNOSIS — I739 Peripheral vascular disease, unspecified: Secondary | ICD-10-CM | POA: Diagnosis not present

## 2015-11-24 DIAGNOSIS — H539 Unspecified visual disturbance: Secondary | ICD-10-CM | POA: Diagnosis not present

## 2015-11-24 DIAGNOSIS — I2510 Atherosclerotic heart disease of native coronary artery without angina pectoris: Secondary | ICD-10-CM

## 2015-11-24 DIAGNOSIS — R519 Headache, unspecified: Secondary | ICD-10-CM

## 2015-11-24 DIAGNOSIS — H538 Other visual disturbances: Secondary | ICD-10-CM | POA: Diagnosis not present

## 2015-11-24 DIAGNOSIS — E785 Hyperlipidemia, unspecified: Secondary | ICD-10-CM | POA: Diagnosis not present

## 2015-11-24 DIAGNOSIS — D7581 Myelofibrosis: Secondary | ICD-10-CM

## 2015-11-24 DIAGNOSIS — D45 Polycythemia vera: Secondary | ICD-10-CM

## 2015-11-24 DIAGNOSIS — R51 Headache: Secondary | ICD-10-CM

## 2015-11-24 LAB — CBC & DIFF AND RETIC
BASO%: 1 % (ref 0.0–2.0)
Basophils Absolute: 0.2 10*3/uL — ABNORMAL HIGH (ref 0.0–0.1)
EOS%: 2.1 % (ref 0.0–7.0)
Eosinophils Absolute: 0.5 10*3/uL (ref 0.0–0.5)
HCT: 39.8 % (ref 38.4–49.9)
HGB: 11.8 g/dL — ABNORMAL LOW (ref 13.0–17.1)
Immature Retic Fract: 24.3 % — ABNORMAL HIGH (ref 3.00–10.60)
LYMPH%: 8.6 % — ABNORMAL LOW (ref 14.0–49.0)
MCH: 22 pg — ABNORMAL LOW (ref 27.2–33.4)
MCHC: 29.6 g/dL — ABNORMAL LOW (ref 32.0–36.0)
MCV: 74.1 fL — ABNORMAL LOW (ref 79.3–98.0)
MONO#: 1.6 10*3/uL — ABNORMAL HIGH (ref 0.1–0.9)
MONO%: 6.9 % (ref 0.0–14.0)
NEUT#: 18.4 10*3/uL — ABNORMAL HIGH (ref 1.5–6.5)
NEUT%: 81.4 % — ABNORMAL HIGH (ref 39.0–75.0)
Platelets: 849 10*3/uL — ABNORMAL HIGH (ref 140–400)
RBC: 5.37 10*6/uL (ref 4.20–5.82)
RDW: 22.8 % — ABNORMAL HIGH (ref 11.0–14.6)
Retic %: 1.05 % (ref 0.80–1.80)
Retic Ct Abs: 56.39 10*3/uL (ref 34.80–93.90)
WBC: 22.7 10*3/uL — ABNORMAL HIGH (ref 4.0–10.3)
lymph#: 2 10*3/uL (ref 0.9–3.3)
nRBC: 0 % (ref 0–0)

## 2015-11-24 LAB — TECHNOLOGIST REVIEW

## 2015-11-24 MED ORDER — IOHEXOL 300 MG/ML  SOLN
75.0000 mL | Freq: Once | INTRAMUSCULAR | Status: AC | PRN
  Administered 2015-11-24: 75 mL via INTRAVENOUS

## 2015-11-24 NOTE — Telephone Encounter (Signed)
Dr. Burr Medico notified of CT Head results.  Spoke with pt and informed pt re:  Per Dr. Burr Medico,  CT results showed Negative for stroke.  Pt can go home.  Pt voiced understanding.

## 2015-11-24 NOTE — Progress Notes (Signed)
[place] OFFICE PROGRESS NOTE  [name], MD [phone] [address], [address] [address]  DIAGNOSIS: Myelofibrosis/Myeloproliferative disorder (Overlap). JAK2+.  CHIEF COMPLAINS: follow up   CURRENT TREATMENT:    1.Hydrea 750 mg twice daily, increased to 1015m twice daily on 10/28/2015 2. Aspirin daily. 3. Therapeutic phlebotomy if hematocrit more than 45%    Myelofibrosis ([place]   12/26/2012 Bone Marrow Biopsy Showed hypercellularity with reticulum fibrosis, JAK-2 mutation positive, BCR/ABL negative, 465XY in 20 metaphase, and Intermediate-1 ,DIPSS of 2. (WBC < 25; Hbg > 10;    01/02/2013 Imaging [place] complete revealed Mild splenogmealy (13.3 cm) and a 2.2 x 1.7 x 1.8 cm left renal cyst   01/15/2013 Pathology Results Integrated hematopathology report.  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    03/17/2013 Imaging CT Chest: Crowded peribronchial markings noted, associated with linear densities involving the left base, probably representing areas of platelike atelectasis/scarring.  No mass or infiltrate seen.    03/19/2013 - 07/20/2013 Chemotherapy Started Jakafi 20 mg bid. Dose reduced due to anemia.  Stopped due to increasing fatigue while on jakafi.    04/28/2013 - 05/01/2013 [facility] J[facility]ia Greaves his cardiologist, due to the fact that he had midsternal chest pain.  He had a coronary stent placed this admission.    05/28/2013 - 05/29/2013 [facility] (Dr. MLendon Colonelnote).    06/10/2013 Surgery Cololonoscopy due to GIB.  C/w diverticular disease and hemorrhoids but no active bleeding.    10/05/2013 -  Chemotherapy Started Hydrea 500 mg daily.   03/19/2014 -  Chemotherapy Hydrea increased to  500 mg bid wbc 17, hb 17, hct 54, plt 437   06/29/2014 Procedure cbc shows wbc 18.8 hb 18.7 hct 58.9 plt 456. ?compliance w hydrea as MCV not elevated. symptomatic start TP weekly x 4   07/28/2014 Procedure TP every 2 weeks if hct > 45, changed to monthly in April 2016     INTERVAL HISTORY:  WMelburn Hake75y.o. male with myleofibrosis who returns follow up. He reports worsening headache for the past one week. It is intermittent,, she'll worse since last night, she rates at 7 out of 10, with intermittent blurry vision. He denies any focal weakness, numbness, or speech problem. He has been having a lot of stress at home, including 2 relative status in the past 2 weeks, and relationship issues with his wife, [place].   MEDICAL HISTORY: Past Medical History  Diagnosis Date  . 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   . Diverticular disease   . Hemorrhoid   . CAD (coronary artery disease)     a. S/p 3V CABG 2000 with LIMA-LAD, left radial-PDA, SVG-OM1. b. 2014: stent to SVG-OM1; then found to have obstruction distal to LAD anastamosis of LIMA s/p DES.   .Marland KitchenPeripheral vascular disease ([place]     a. R femoropopliteal bypass ~2012 in [place], reportedly shown to be occluded. Now followed by Dr. BGwenlyn Found  . DM (diabetes mellitus) ([place]   . Hyperlipidemia   . HTN (hypertension)   . RBBB      ALLERGIES:  has No Known Allergies.  MEDICATIONS: has a current medication list which includes the following prescription(s): aspirin ec, atorvastatin, atorvastatin, carvedilol, cholecalciferol, clopidogrel, hydrocodone-acetaminophen,  hydroxyurea, insulin glargine, insulin lispro, isosorbide mononitrate, levetiracetam, linagliptin-metformin hcl, metoclopramide, proair hfa, fluticasone, and nitroglycerin.  SURGICAL HISTORY:  Past Surgical History  Procedure Laterality Date  . Coronary artery bypass graft  2000    LM occluded, LIMA to LAD patent but distal native LAD  disease, saphenous vein bypass graft to OM with 95% stenosis in the midportion, right coronary artery occluded, radial artery graft to right coronary artery patent but was diffusely small. Marginal graft stented. 7 2014. Of note he had multiple previous stents in the right coronary artery. The distal LAD was treated with angioplasty. I  . Cardiac catheterization  May 2014  . Percutaneous coronary stent intervention (pci-s)  April 27 2013  . Vein surgery    . Tee without cardioversion N/A 07/12/2014    Procedure: TRANSESOPHAGEAL ECHOCARDIOGRAM (TEE);  Surgeon: Fay Records, MD;  Location[facility] ENDOSCOPY;  Service: Cardiovascular;  Laterality: N/A;  . Loop recorder implant N/A 07/12/2014    Procedure: LOOP RECORDER IMPLANT;  Surgeon: Evans Lance, MD;  Location: [facility] CATH LAB;  Service: Cardiovascular;  Laterality: N/A;  . Abdominal aortagram N/A 09/21/2014    Procedure: ABDOMINAL Maxcine Ham;  Surgeon: Elam Dutch, MD;  Location: [facility] CATH LAB;  Service: Cardiovascular;  Laterality: N/A;  . Lower extremity angiogram  09/21/2014    Procedure: LOWER EXTREMITY ANGIOGRAM;  Surgeon: Elam Dutch, MD;  Location: [facility] CATH LAB;  Service: Cardiovascular;;  . Abdominal angiogram N/A 01/18/2015    Procedure: ABDOMINAL ANGIOGRAM;  Surgeon: Serafina Mitchell, MD;  Location: [facility] CATH LAB;  Service: Cardiovascular;  Laterality: N/A;     Medication List       This list is accurate as of: 11/24/15  8:16 PM.  Always use your most recent med list.               aspirin EC 81 MG tablet  Take 81 mg by mouth daily.     atorvastatin 20 MG tablet  Commonly known as:  LIPITOR  Take 20 mg by mouth daily. Takes along with a 40 mg tablet daily     atorvastatin 40 MG tablet  Commonly known as:  LIPITOR  Take 1 tablet (40 mg total) by mouth daily.     carvedilol 6.25 MG tablet  Commonly known as:  COREG  Take 1 tablet (6.25 mg total) by mouth 2 (two) times daily with a meal.     cholecalciferol 1000 units tablet   Commonly known as:  VITAMIN D  Take 1,000 Units by mouth daily.     clopidogrel 75 MG tablet  Commonly known as:  PLAVIX  Take 1 tablet (75 mg total) by mouth daily with breakfast.     fluticasone 50 MCG/ACT nasal spray  Commonly known as:  FLONASE  Place 1 spray into both nostrils daily.     HYDROcodone-acetaminophen 5-325 MG tablet  Commonly known as:  NORCO/VICODIN  Take 1 tablet by mouth every 6 (six) hours as needed.     hydroxyurea 500 MG capsule  Commonly known as:  HYDREA  Take 1 capsule (500 mg total) by mouth 2 (two) times daily. May take with food to minimize GI side effects.     Insulin Glargine 100 UNIT/ML Solostar Pen  Commonly known as:  LANTUS SOLOSTAR  Inject 10 Units into the skin 2 (two) times daily.     insulin lispro 100 UNIT/ML injection  Commonly known as:  HUMALOG  Inject 4 Units into the skin 3 (three) times  daily before meals.     isosorbide mononitrate 60 MG 24 hr tablet  Commonly known as:  IMDUR  Take 1-2 tablets (60-120 mg total) by mouth 2 (two) times daily. 2 in the morning and 1 in the evening     levETIRAcetam 500 MG tablet  Commonly known as:  KEPPRA  Take 1 and 1/2 tablets by mouth twice daily     Linagliptin-Metformin HCl 2.5-500 MG Tabs  Take 1 tablet by mouth 2 (two) times daily.     metoCLOPramide 10 MG tablet  Commonly known as:  REGLAN  10 mg. Take 1 tablet 3 times a day     nitroGLYCERIN 0.4 MG SL tablet  Commonly known as:  NITROSTAT  Place 1 tablet (0.4 mg total) under the tongue every 5 (five) minutes as needed for chest pain.     PROAIR HFA 108 (90 Base) MCG/ACT inhaler  Generic drug:  albuterol  Inhale 1 puff into the lungs every 4 (four) hours as needed for wheezing or shortness of breath.        REVIEW OF SYSTEMS:   Constitutional: Denies fevers, chills or abnormal weight loss, (+) fatigue  Eyes: Admits to blurriness of vision and floaters Ears, nose, mouth, throat, and face: Denies mucositis or sore  throat Respiratory: Denies cough, dyspnea or wheezes Cardiovascular: Denies palpitation, chest discomfort or lower extremity swelling Gastrointestinal:  Denies nausea, heartburn or change in bowel habits Skin: Denies abnormal skin rashes Lymphatics: Denies new lymphadenopathy or easy bruising Neurological:Denies numbness, tingling or new weaknesses Behavioral/Psych: Mood is stable, no new changes  All other systems were reviewed with the patient and are negative.  PHYSICAL EXAMINATION: ECOG PERFORMANCE STATUS: 1  Blood pressure 124/61, pulse 92, temperature 98.1 F (36.7 C), temperature source Oral, resp. rate 18, height _0  (1.702 m), weight 197 lb 12.8 oz (89.721 kg), SpO2 97 %.  GENERAL:alert, no distress and comfortable; elderly male who appears his stated age.  SKIN: skin color, texture, turgor are normal, no rashes or significant lesions EYES: normal, Conjunctiva are pink and non-injected, sclera clear OROPHARYNX:no exudate, no erythema and lips, buccal mucosa, and tongue normal  NECK: supple, thyroid normal size, non-tender, without nodularity LYMPH:  no palpable lymphadenopathy in the cervical, axillary or supraclavicular LUNGS: clear to auscultation with normal breathing effort, no wheezes or rhonchi HEART: regular rate & rhythm and no murmurs and no lower extremity edema ABDOMEN:abdomen soft, non-tender and normal bowel sounds, mild splenmagely, about 3-4 cm below rib cage, non-tender  Musculoskeletal:no cyanosis of digits and no clubbing, no significant tenderness at the spine or paraspinal area. NEURO: alert & oriented x 3 with fluent speech, no focal motor/sensory deficits, his vision field deficit   Labs:  CBC Latest Ref Rng 11/24/2015 11/11/2015 10/28/2015  WBC 4.0 - 10.3 10e3/uL 22.7(H) 7.4 20.6(H)  Hemoglobin 13.0 - 17.1 g/dL 11.8(L) 12.0(L) 12.2(L)  Hematocrit 38.4 - 49.9 % 39.8 41.7 42.3  Platelets 140 - 400 10e3/uL 849(H) 228 Large platelets present 519(H)    CMP  Latest Ref Rng 10/28/2015 07/28/2015 02/16/2015  Glucose 70 - 140 mg/dl 196(H) 128 159(H)  BUN 7.0 - 26.0 mg/dL 12.9 10.3 9  Creatinine 0.7 - 1.3 mg/dL 1.1 1.0 0.93  Sodium 136 - 145 mEq/L 142 143 140  Potassium 3.5 - 5.1 mEq/L 4.1 3.9 4.1  Chloride 101 - 111 mmol/L - - 105  CO2 22 - 29 mEq/L _1 Calcium 8.4 - 10.4 mg/dL 9.2 9.1 9.1  Total Protein 6.4 -  8.3 g/dL 6.6 6.0(L) -  Total Bilirubin 0.20 - 1.20 mg/dL 0.46 0.44 -  Alkaline Phos 40 - 150 U/L 110 101 -  AST 5 - 34 U/L 18 12 -  ALT 0 - 55 U/L 18 <9 -     RADIOGRAPHIC STUDIES: CT abdomen and pelvis with contrast on 12/14/2014  IMPRESSION: 1. No acute findings within the abdomen or pelvis. 2. Atherosclerotic disease. 3. Kidney cysts.  ASSESSMENT: Tyler Whitehead 75 y.o. male with a history of Myelofibrosis, jak 2 mutation and symptomatic polycythemia. Wbc and platelets are high as well.   PLAN:   1. Primary Myeloproliferative neoplasm (Myelofibrosis), JAK2 mutation (+), DIPSS 2, intermediate risk-1   -We discussed that this is not curable disease, but treatable. Some patient will develop acute leukemia or aplastic anemia later on. -he has intermediate risk disease, which predicts median survival 6.5 years. -he is not a good candidate for stem cell transplant due to his advanced age and multiple medical comorbidities. -I discussed that he is at high risk for thrombosis secondary to his disease, he will continue aspirin and plavix  -he tried [place] for 4 months in 2014, could not tolerate due to his anemia and fatigue. -We again emphasized the importance of continued Hydrea and normalized his blood counts to prevent further stroke or thrombosis. He agrees to continue Hydrea and compliant with treatment. -his blood counts have fluctuated a lot in the past one month. Giving the high WBC and thrombocytosis, I recommend him to increase Hydrea to 1.5g in am and 1g pm   -he will return in 2 weeks for lab. He is moving to [place] in 2  weeks, I urged him to find a local hematologist as soon as possible, and I'll refer him. He needs close follow up.   2. Worsening headache and vision deficits  - he has no significant deficits on the exam  - he reports intermittent remission deficits, and a similar sensation when he had stroke 1.5 years ago - due to his high-risk of stroke from his MPN, and clinical symptoms, I will order a stat CT head was and without contrast to ruled out acute CVA.   3. CADz s/p CABG x 3, hypertension  --Continue lisinopril, coreg, aspirin and plavix  -He will follow up with his cardiologist  4. PvDz.  --Continue Imdur as it appearing to have improved some of his claudication symptoms.   5. Dyslipidemia.  --Continue statin therapy.   6. Stroke and seizure in 07/2014 -- follow up with his neurologist   Plan; - increase Hydrea to 1582m in am, and 10032mpm  -CBC and f/u on 2/27 -I will refer him to his new hematologist in [place] as soon as he give me the practice name    All questions were answered. The patient knows to call the clinic with any problems, questions or concerns. We can certainly see the patient much sooner if necessary.  I spent 25 minutes counseling the patient face to face. The total time spent in the appointment was 35 minutes.  FeTruitt Merle/16/2017   ADDENDUM CT head w wo contrast 11/24/15 IMPRESSION: No acute intracranial findings. Normal for age cerebral volume. Remote LEFT occipital CVA.  The above CT scan result were discussed with patient, he was discharged home today. I encouraged him to follow-up with his neurologist for his headache.  FeTruitt Merle2/16/2017

## 2015-11-24 NOTE — Telephone Encounter (Signed)
Reviewed episode ECGs with Dr. Lovena Le.  ECGs suggest SR with PACs and PVCs, not atrial fibrillation.

## 2015-11-25 ENCOUNTER — Telehealth: Payer: Self-pay | Admitting: *Deleted

## 2015-11-25 ENCOUNTER — Other Ambulatory Visit: Payer: Self-pay | Admitting: Hematology

## 2015-11-25 DIAGNOSIS — D7581 Myelofibrosis: Secondary | ICD-10-CM

## 2015-11-25 MED ORDER — HYDROXYUREA 500 MG PO CAPS: ORAL_CAPSULE | ORAL | Status: DC

## 2015-11-25 NOTE — Telephone Encounter (Signed)
I called in.   Tyler Whitehead  11/25/2015

## 2015-11-25 NOTE — Telephone Encounter (Signed)
Patient called.  He saw Dr. Burr Medico yesterday and needs a refill of his hydrea.    Please have Dr. Burr Medico refill medication.  Patient call back is [phone].

## 2015-11-28 ENCOUNTER — Telehealth: Payer: Self-pay | Admitting: Hematology

## 2015-11-28 ENCOUNTER — Encounter: Payer: Self-pay | Admitting: Neurology

## 2015-11-28 NOTE — Telephone Encounter (Signed)
Spoke with patient re lab/fu 2/27.

## 2015-12-01 ENCOUNTER — Other Ambulatory Visit: Payer: Self-pay | Admitting: *Deleted

## 2015-12-01 ENCOUNTER — Telehealth: Payer: Self-pay | Admitting: Hematology

## 2015-12-01 ENCOUNTER — Telehealth: Payer: Self-pay | Admitting: *Deleted

## 2015-12-01 NOTE — Telephone Encounter (Signed)
Moved 2/27 appt to 2/24 per 2/23 pof. Pt aware

## 2015-12-01 NOTE — Telephone Encounter (Signed)
Called pt earlier this am & requested that he come on 12/02/15 instead of 12/05/15.  He was willing & POF sent to scheduler to move appts.

## 2015-12-02 ENCOUNTER — Encounter: Payer: Self-pay | Admitting: Hematology

## 2015-12-02 ENCOUNTER — Ambulatory Visit (HOSPITAL_BASED_OUTPATIENT_CLINIC_OR_DEPARTMENT_OTHER): Payer: Medicare Other | Admitting: Hematology

## 2015-12-02 ENCOUNTER — Other Ambulatory Visit (HOSPITAL_BASED_OUTPATIENT_CLINIC_OR_DEPARTMENT_OTHER): Payer: Medicare Other

## 2015-12-02 VITALS — BP 133/58 | HR 85 | Temp 98.0°F | Resp 18 | Wt 196.5 lb

## 2015-12-02 DIAGNOSIS — D45 Polycythemia vera: Secondary | ICD-10-CM

## 2015-12-02 DIAGNOSIS — D7581 Myelofibrosis: Secondary | ICD-10-CM

## 2015-12-02 DIAGNOSIS — Z8673 Personal history of transient ischemic attack (TIA), and cerebral infarction without residual deficits: Secondary | ICD-10-CM | POA: Diagnosis not present

## 2015-12-02 DIAGNOSIS — H538 Other visual disturbances: Secondary | ICD-10-CM | POA: Diagnosis not present

## 2015-12-02 DIAGNOSIS — I2510 Atherosclerotic heart disease of native coronary artery without angina pectoris: Secondary | ICD-10-CM | POA: Diagnosis not present

## 2015-12-02 DIAGNOSIS — I739 Peripheral vascular disease, unspecified: Secondary | ICD-10-CM | POA: Diagnosis not present

## 2015-12-02 DIAGNOSIS — E785 Hyperlipidemia, unspecified: Secondary | ICD-10-CM

## 2015-12-02 DIAGNOSIS — R51 Headache: Secondary | ICD-10-CM

## 2015-12-02 LAB — CBC & DIFF AND RETIC
BASO%: 1.5 % (ref 0.0–2.0)
Basophils Absolute: 0.2 10*3/uL — ABNORMAL HIGH (ref 0.0–0.1)
EOS%: 2.4 % (ref 0.0–7.0)
Eosinophils Absolute: 0.3 10*3/uL (ref 0.0–0.5)
HCT: 38.8 % (ref 38.4–49.9)
HGB: 11.5 g/dL — ABNORMAL LOW (ref 13.0–17.1)
Immature Retic Fract: 25.1 % — ABNORMAL HIGH (ref 3.00–10.60)
LYMPH%: 10.2 % — ABNORMAL LOW (ref 14.0–49.0)
MCH: 22.1 pg — ABNORMAL LOW (ref 27.2–33.4)
MCHC: 29.6 g/dL — ABNORMAL LOW (ref 32.0–36.0)
MCV: 74.6 fL — ABNORMAL LOW (ref 79.3–98.0)
MONO#: 0.4 10*3/uL (ref 0.1–0.9)
MONO%: 3.4 % (ref 0.0–14.0)
NEUT#: 8.8 10*3/uL — ABNORMAL HIGH (ref 1.5–6.5)
NEUT%: 82.5 % — ABNORMAL HIGH (ref 39.0–75.0)
Platelets: 788 10*3/uL — ABNORMAL HIGH (ref 140–400)
RBC: 5.2 10*6/uL (ref 4.20–5.82)
RDW: 24.4 % — ABNORMAL HIGH (ref 11.0–14.6)
Retic %: 1.38 % (ref 0.80–1.80)
Retic Ct Abs: 71.76 10*3/uL (ref 34.80–93.90)
WBC: 10.7 10*3/uL — ABNORMAL HIGH (ref 4.0–10.3)
lymph#: 1.1 10*3/uL (ref 0.9–3.3)
nRBC: 0 % (ref 0–0)

## 2015-12-02 MED ORDER — HYDROXYUREA 500 MG PO CAPS: ORAL_CAPSULE | ORAL | Status: DC

## 2015-12-02 NOTE — Progress Notes (Signed)
Tyler Whitehead OFFICE PROGRESS NOTE  Tyler Copa, MD [phone] [address], [address] [place] [address]  DIAGNOSIS: Myelofibrosis/Myeloproliferative disorder (Overlap). JAK2+.  CHIEF COMPLAINS: follow up   CURRENT TREATMENT:    1.Hydrea 750 mg twice daily, increased to 1060m twice daily on 10/28/2015, and 15082mam, 100017mm on 11/24/2015 2. Aspirin daily. 3. Therapeutic phlebotomy if hematocrit more than 45%    [place]ofib[place] ([place] 12/26/2012 Bone Marrow Biopsy Showed hypercellularity with reticulum fibrosis, JAK-2 mutation positive, BCR/ABL negative, 46 52 in 20 metaphase, and Intermediate-1 ,DIPSS of 2. (WBC < 25; Hbg > 10;    01/02/2013 Imaging US [place] complete revealed Mild splenogmealy (13.3 cm) and a 2.2 x 1.7 x 1.8 cm left renal cyst   01/15/2013 Pathology Results Integrated hematopathology report.  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    03/17/2013 Imaging CT Chest: Crowded peribronchial markings noted, associated with linear densities involving the left base, probably representing areas of platelike atelectasis/scarring.  No mass or infiltrate seen.    03/19/2013 - 07/20/2013 Chemotherapy Started Jakafi 20 mg bid. Dose reduced due to anemia.  Stopped due to increasing fatigue while on jakafi.    04/28/2013 - 05/01/2013 [facility] Dr[facility] JamFrancia Greavesis cardiologist, due to the fact that he had midsternal chest pain.  He had a coronary stent placed this admission.    05/28/2013 - 05/29/2013 [facility] (Dr. MenLendon Colonelte).    06/10/2013 Surgery Cololonoscopy due to GIB.  C/w diverticular disease and hemorrhoids but no active bleeding.    10/05/2013 -  Chemotherapy Started Hydrea 500 mg daily.   03/19/2014  -  Chemotherapy Hydrea increased to 500 mg bid wbc 17, hb 17, hct 54, plt 437   06/29/2014 Procedure cbc shows wbc 18.8 hb 18.7 hct 58.9 plt 456. ?compliance w hydrea as MCV not elevated. symptomatic start TP weekly x 4   07/28/2014 Procedure TP every 2 weeks if hct > 45, changed to monthly in April 2016     INTERVAL HISTORY:  Tyler Whitehead 75o. male with myleofibrosis who returns follow up. He is moving to [place] this weekend. His headache has improved, no other new changes. He takes hydrea as I recommended.   MEDICAL HISTORY: Past Medical History  Diagnosis Date  . 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 . Diverticular disease   . Hemorrhoid   . CAD (coronary artery disease)     a. S/p 3V CABG 2000 with LIMA-LAD, left radial-PDA, SVG-OM1. b. 2014: stent to SVG-OM1; then found to have obstruction distal to LAD anastamosis of LIMA s/p DES.   . PMarland Kitchenripheral vascular disease ([place]CC[place]   a. R femoropopliteal bypass ~2012 in [place], reportedly shown to be occluded. Now followed by Dr. BerGwenlyn Found. DM (diabetes mellitus) ([place] . Hyperlipidemia   . HTN (hypertension)   . RBBB      ALLERGIES:  has No Known Allergies.  MEDICATIONS: has a current medication list which includes the following prescription(s): aspirin ec, atorvastatin, atorvastatin, carvedilol, cholecalciferol, clopidogrel, fluticasone, hydrocodone-acetaminophen, hydroxyurea, insulin glargine, insulin lispro, isosorbide mononitrate, levetiracetam, linagliptin-metformin hcl, metoclopramide, nitroglycerin, and proair hfa.  SURGICAL HISTORY:  Past Surgical History  Procedure Laterality Date  . Coronary artery bypass graft  2000    LM occluded, LIMA to LAD patent but distal native  LAD disease, saphenous vein bypass graft to OM with 95% stenosis in the midportion, right coronary artery occluded, radial artery graft to right coronary artery patent but was diffusely small. Marginal graft stented. 7 2014. Of note he had multiple previous stents in the  right coronary artery. The distal LAD was treated with angioplasty. I  . Cardiac catheterization  May 2014  . Percutaneous coronary stent intervention (pci-s)  April 27 2013  . Vein surgery    . Tee without cardioversion N/A 07/12/2014    Procedure: TRANSESOPHAGEAL ECHOCARDIOGRAM (TEE);  Surgeon: Fay Records, MD;  Location: [facility] ENDOSCOPY;  Service: Cardiovascular;  Laterality: N/A;  . Loop recorder implant N/A 07/12/2014    Procedure: LOOP RECORDER IMPLANT;  Surgeon: Evans Lance, MD;  Location: [facility] CATH LAB;  Service: Cardiovascular;  Laterality: N/A;  . Abdominal aortagram N/A 09/21/2014    Procedure: ABDOMINAL Maxcine Ham;  Surgeon: Elam Dutch, MD;  Location[facility] C[facility] LAB;  Service: Cardiovascular;  Laterality: N/A;  . Lower extremity angiogram  09/21/2014    Procedure: LOWER EXTREMITY ANGIOGRAM;  Surgeon: Elam Dutch, MD;  Location: [facility] CATH LAB;  Service: Cardiovascular;;  . Abdominal angiogram N/A 01/18/2015    Procedure: ABDOMINAL ANGIOGRAM;  Surgeon: Serafina Mitchell, MD;  Location: [facility] CATH LAB;  Service: Cardiovascular;  Laterality: N/A;     Medication List       This list is accurate as of: 12/02/15  7:51 AM.  Always use your most recent med list.               aspirin EC 81 MG tablet  Take 81 mg by mouth daily.     atorvastatin 20 MG tablet  Commonly known as:  LIPITOR  Take 20 mg by mouth daily. Takes along with a 40 mg tablet daily     atorvastatin 40 MG tablet  Commonly known as:  LIPITOR  Take 1 tablet (40 mg total) by mouth daily.     carvedilol 6.25 MG tablet  Commonly known as:  COREG  Take 1 tablet (6.25 mg total) by mouth 2 (two) times daily with a meal.     cholecalciferol 1000 units tablet  Commonly known as:  VITAMIN D  Take 1,000 Units by mouth daily.     clopidogrel 75 MG tablet  Commonly known as:  PLAVIX  Take 1 tablet (75 mg total) by mouth daily with breakfast.     fluticasone 50 MCG/ACT nasal spray  Commonly known as:  FLONASE  Place 1  spray into both nostrils daily.     HYDROcodone-acetaminophen 5-325 MG tablet  Commonly known as:  NORCO/VICODIN  Take 1 tablet by mouth every 6 (six) hours as needed.     hydroxyurea 500 MG capsule  Commonly known as:  HYDREA  Take 3 cap in morning and 2 cap after dinner. May take with food to minimize GI side effects.     Insulin Glargine 100 UNIT/ML Solostar Pen  Commonly known as:  LANTUS SOLOSTAR  Inject 10 Units into the skin 2 (two) times daily.     insulin lispro 100 UNIT/ML injection  Commonly known as:  HUMALOG  Inject 4 Units into the skin 3 (three) times daily before meals.     isosorbide mononitrate 60 MG 24 hr tablet  Commonly known as:  IMDUR  Take 1-2 tablets (60-120 mg total) by mouth 2 (two) times daily. 2 in the morning and 1 in the evening     levETIRAcetam 500 MG  tablet  Commonly known as:  KEPPRA  Take 1 and 1/2 tablets by mouth twice daily     Linagliptin-Metformin HCl 2.5-500 MG Tabs  Take 1 tablet by mouth 2 (two) times daily.     metoCLOPramide 10 MG tablet  Commonly known as:  REGLAN  10 mg. Take 1 tablet 3 times a day     nitroGLYCERIN 0.4 MG SL tablet  Commonly known as:  NITROSTAT  Place 1 tablet (0.4 mg total) under the tongue every 5 (five) minutes as needed for chest pain.     PROAIR HFA 108 (90 Base) MCG/ACT inhaler  Generic drug:  albuterol  Inhale 1 puff into the lungs every 4 (four) hours as needed for wheezing or shortness of breath.        REVIEW OF SYSTEMS:   Constitutional: Denies fevers, chills or abnormal weight loss, (+) fatigue  Eyes: Admits to blurriness of vision and floaters Ears, nose, mouth, throat, and face: Denies mucositis or sore throat Respiratory: Denies cough, dyspnea or wheezes Cardiovascular: Denies palpitation, chest discomfort or lower extremity swelling Gastrointestinal:  Denies nausea, heartburn or change in bowel habits Skin: Denies abnormal skin rashes Lymphatics: Denies new lymphadenopathy or easy  bruising Neurological:Denies numbness, tingling or new weaknesses Behavioral/Psych: Mood is stable, no new changes  All other systems were reviewed with the patient and are negative.  PHYSICAL EXAMINATION: ECOG PERFORMANCE STATUS: 1  Blood pressure 133/58, pulse 85, temperature 98 F (36.7 C), temperature source Oral, resp. rate 18, weight 196 lb 8 oz (89.132 kg), SpO2 99 %.  GENERAL:alert, no distress and comfortable; elderly male who appears his stated age.  SKIN: skin color, texture, turgor are normal, no rashes or significant lesions EYES: normal, Conjunctiva are pink and non-injected, sclera clear OROPHARYNX:no exudate, no erythema and lips, buccal mucosa, and tongue normal  NECK: supple, thyroid normal size, non-tender, without nodularity LYMPH:  no palpable lymphadenopathy in the cervical, axillary or supraclavicular LUNGS: clear to auscultation with normal breathing effort, no wheezes or rhonchi HEART: regular rate & rhythm and no murmurs and no lower extremity edema ABDOMEN:abdomen soft, non-tender and normal bowel sounds, mild splenmagely, about 3-4 cm below rib cage, non-tender  Musculoskeletal:no cyanosis of digits and no clubbing, no significant tenderness at the spine or paraspinal area. NEURO: alert & oriented x 3 with fluent speech, no focal motor/sensory deficits, his vision field deficit   Labs:  CBC Latest Ref Rng 12/02/2015 11/24/2015 11/11/2015  WBC 4.0 - 10.3 10e3/uL 10.7(H) 22.7(H) 7.4  Hemoglobin 13.0 - 17.1 g/dL 11.5(L) 11.8(L) 12.0(L)  Hematocrit 38.4 - 49.9 % 38.8 39.8 41.7  Platelets 140 - 400 10e3/uL 788(H) 849(H) 228 Large platelets present    CMP Latest Ref Rng 10/28/2015 07/28/2015 02/16/2015  Glucose 70 - 140 mg/dl 196(H) 128 159(H)  BUN 7.0 - 26.0 mg/dL 12.9 10.3 9  Creatinine 0.7 - 1.3 mg/dL 1.1 1.0 0.93  Sodium 136 - 145 mEq/L 142 143 140  Potassium 3.5 - 5.1 mEq/L 4.1 3.9 4.1  Chloride 101 - 111 mmol/L - - 105  CO2 22 - 29 mEq/L '29 27 28  ' Calcium  8.4 - 10.4 mg/dL 9.2 9.1 9.1  Total Protein 6.4 - 8.3 g/dL 6.6 6.0(L) -  Total Bilirubin 0.20 - 1.20 mg/dL 0.46 0.44 -  Alkaline Phos 40 - 150 U/L 110 101 -  AST 5 - 34 U/L 18 12 -  ALT 0 - 55 U/L 18 <9 -     RADIOGRAPHIC STUDIES: CT abdomen and pelvis  with contrast on 12/14/2014  IMPRESSION: 1. No acute findings within the abdomen or pelvis. 2. Atherosclerotic disease. 3. Kidney cysts.  ASSESSMENT: Tyler Whitehead 75 y.o. male with a history of Myelofibrosis, jak 2 mutation and symptomatic polycythemia. Wbc and platelets are high as well.   PLAN:   1. Primary Myeloproliferative neoplasm (Myelofibrosis), JAK2 mutation (+), DIPSS 2, intermediate risk-1   -We discussed that this is not curable disease, but treatable. Some patient will develop acute leukemia or aplastic anemia later on. -he has intermediate risk disease, which predicts median survival 6.5 years. -he is not a good candidate for stem cell transplant due to his advanced age and multiple medical comorbidities. -I discussed that he is at high risk for thrombosis secondary to his disease, he will continue aspirin and plavix  -he tried [place] for 4 months in 2014, could not tolerate due to his anemia and fatigue. -We again emphasized the importance of continued Hydrea and normalized his blood counts to prevent further stroke or thrombosis. He agrees to continue Hydrea and compliant with treatm -Giving the high WBC and thrombocytosis, I recently rrecmmend him to increase Hydrea to 1.5g in am and 1g pm, plt improved today, will continue at current dose -he  Is moving to [place] in a few days, I urged him to find a local hematologist as soon as possible, and I'll refer him to a hematologist at [facility], he agrees. He needs close follow up.   2. headache and vision deficits  - he has no significant  Neuro deficits on the exam  - CT head with contrast was negative for stroke on 2/16 -improved, continue monitoring   3. CADz  s/p CABG x 3, hypertension  --Continue lisinopril, coreg, aspirin and plavix  -He will follow up with his cardiologist  4. PvDz.  --Continue Imdur as it appearing to have improved some of his claudication symptoms.   5. Dyslipidemia.  --Continue statin therapy.   6. Stroke and seizure in 07/2014 -- follow up with his neurologist   Plan; -continue Hydrea 1541m in am, and 10067mpm  -I will [facility] medical records staff [facility] [place], to be seen in 2-3 weeks   All questions were answered. The patient knows to call the clinic with any problems, questions or concerns. We can certainly see the patient much sooner if necessary.  I spent 15 minutes counseling the patient face to face. The total time spent in the appointment was 20 minutes.  FeTruitt Merle/24/2017

## 2015-12-05 ENCOUNTER — Ambulatory Visit (INDEPENDENT_AMBULATORY_CARE_PROVIDER_SITE_OTHER): Payer: Medicare Other | Admitting: *Deleted

## 2015-12-05 ENCOUNTER — Other Ambulatory Visit: Payer: Medicare Other

## 2015-12-05 ENCOUNTER — Encounter: Payer: Self-pay | Admitting: Internal Medicine

## 2015-12-05 ENCOUNTER — Ambulatory Visit (INDEPENDENT_AMBULATORY_CARE_PROVIDER_SITE_OTHER): Payer: Medicare Other | Admitting: Internal Medicine

## 2015-12-05 ENCOUNTER — Telehealth: Payer: Self-pay | Admitting: Hematology

## 2015-12-05 ENCOUNTER — Ambulatory Visit: Payer: Medicare Other | Admitting: Hematology

## 2015-12-05 VITALS — BP 124/74 | HR 88 | Ht 67.0 in | Wt 195.8 lb

## 2015-12-05 DIAGNOSIS — I639 Cerebral infarction, unspecified: Secondary | ICD-10-CM | POA: Diagnosis not present

## 2015-12-05 LAB — CUP PACEART INCLINIC DEVICE CHECK: Date Time Interrogation Session: 20170227121148

## 2015-12-05 MED ORDER — RIVAROXABAN 20 MG PO TABS: 20.0000 mg | ORAL_TABLET | Freq: Every day | ORAL | Status: DC

## 2015-12-05 NOTE — Telephone Encounter (Signed)
Faxed medical records to Dr. Silvano Bilis @ [phone] pgs

## 2015-12-05 NOTE — Patient Instructions (Signed)
Medication Instructions:  Your physician has recommended you make the following change in your medication:  1) Stop Plavix 2) Start Xarelto 20 mg daily   Labwork: None ordered   Testing/Procedures: None ordered   Follow-Up: Your physician recommends that you schedule a follow-up appointment as needed as he is moving   Any Other Special Instructions Will Be Listed Below (If Applicable).     If you need a refill on your cardiac medications before your next appointment, please call your pharmacy.

## 2015-12-05 NOTE — Progress Notes (Signed)
HPI Tyler Whitehead returns today for followup of cryptogenic stroke. He is a pleasant 75 yo man with HTN who sustained a stroke over a year ago. He has had no atrial arrhythmias in the interim. He denies syncope. He notes trouble sleeping and feels fatigued and tired during the day. He admits to sleeping during the day. He has had palpitations and is moving to [place]. No edema. No Known Allergies   Current Outpatient Prescriptions  Medication Sig Dispense Refill  . aspirin EC 81 MG tablet Take 81 mg by mouth daily.    Marland Kitchen atorvastatin (LIPITOR) 20 MG tablet Take 20 mg by mouth daily. Takes along with a 40 mg tablet daily    . atorvastatin (LIPITOR) 40 MG tablet Take 1 tablet (40 mg total) by mouth daily. 90 tablet 3  . carvedilol (COREG) 6.25 MG tablet Take 1 tablet (6.25 mg total) by mouth 2 (two) times daily with a meal. 180 tablet 0  . cholecalciferol (VITAMIN D) 1000 UNITS tablet Take 1,000 Units by mouth daily.    . clopidogrel (PLAVIX) 75 MG tablet Take 1 tablet (75 mg total) by mouth daily with breakfast. 90 tablet 0  . fluticasone (FLONASE) 50 MCG/ACT nasal spray Place 2 sprays into both nostrils as directed.    . hydroxyurea (HYDREA) 500 MG capsule Take 3 cap in morning and 2 cap after dinner. May take with food to minimize GI side effects. 150 capsule 0  . insulin glargine (LANTUS) 100 UNIT/ML injection Inject 22 Units into the skin at bedtime.    . insulin lispro (HUMALOG) 100 UNIT/ML injection Inject 4 Units into the skin 3 (three) times daily before meals.     . isosorbide mononitrate (IMDUR) 60 MG 24 hr tablet Take 1-2 tablets (60-120 mg total) by mouth 2 (two) times daily. 2 in the morning and 1 in the evening 360 tablet 0  . levETIRAcetam (KEPPRA) 500 MG tablet Take 1 and 1/2 tablets by mouth twice daily 90 tablet 11  . Linagliptin-Metformin HCl 2.5-500 MG TABS Take 1 tablet by mouth 2 (two) times daily.    . metoCLOPramide (REGLAN) 10 MG tablet 10 mg. Take 1 tablet 3 times a  day    . nitroGLYCERIN (NITROSTAT) 0.4 MG SL tablet Place 1 tablet (0.4 mg total) under the tongue every 5 (five) minutes as needed for chest pain. 25 tablet 12  . PROAIR HFA 108 (90 BASE) MCG/ACT inhaler Inhale 1 puff into the lungs every 4 (four) hours as needed for wheezing or shortness of breath.      No current facility-administered medications for this visit.     Past Medical History  Diagnosis Date  . 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[place])   . Diverticular disease   . Hemorrhoid   . CAD (coronary artery disease)     a. S/p 3V CABG 2000 with LIMA-LAD, left radial-PDA, SVG-OM1. b. 2014: stent to SVG-OM1; then found to have obstruction distal to LAD anastamosis of LIMA s/p DES.   Marland Kitchen Peripheral vascular disease ([place])     a. R femoropopliteal bypass ~2012 in [place], reportedly shown to be occluded. Now followed by Dr. [name].  . DM (diabetes mellitus) ([place])   . Hyperlipidemia   . HTN (hypertension)   . RBBB     ROS:   All systems reviewed and negative except as noted in the HPI.   Past Surgical History  Procedure Laterality Date  . Coronary artery bypass graft  2000    LM  occluded, LIMA to LAD patent but distal native LAD disease, saphenous vein bypass graft to OM with 95% stenosis in the midportion, right coronary artery occluded, radial artery graft to right coronary artery patent but was diffusely small. Marginal graft stented. 7 2014. Of note he had multiple previous stents in the right coronary artery. The distal LAD was treated with angioplasty. I  . Cardiac catheterization  May 2014  . Percutaneous coronary stent intervention (pci-s)  April 27 2013  . Vein surgery    . Tee without cardioversion N/A 07/12/2014    Procedure: TRANSESOPHAGEAL ECHOCARDIOGRAM (TEE);  Surgeon: Fay Records, MD;  Location: [facility] ENDOSCOPY;  Service: Cardiovascular;  Laterality: N/A;  . Loop recorder implant N/A 07/12/2014    Procedure: LOOP RECORDER IMPLANT;  Surgeon: Evans Lance, MD;  Location: [facility] CATH LAB;   Service: Cardiovascular;  Laterality: N/A;  . Abdominal aortagram N/A 09/21/2014    Procedure: ABDOMINAL Maxcine Ham;  Surgeon: Elam Dutch, MD;  Location: [facility] CATH LAB;  Service: Cardiovascular;  Laterality: N/A;  . Lower extremity angiogram  09/21/2014    Procedure: LOWER EXTREMITY ANGIOGRAM;  Surgeon: Elam Dutch, MD;  Location: [facility] CATH LAB;  Service: Cardiovascular;;  . Abdominal angiogram N/A 01/18/2015    Procedure: ABDOMINAL ANGIOGRAM;  Surgeon: Serafina Mitchell, MD;  Location: [facility] CATH LAB;  Service: Cardiovascular;  Laterality: N/A;     Family History  Problem Relation Age of Onset  . Hyperlipidemia Maternal Grandmother   . Asthma Father   . Heart disease Father   . CAD      Multiple siblings     Social History   Social History  . Marital Status: Married    Spouse Name: N/A  . Number of Children: N/A  . Years of Education: N/A   Occupational History  . Limo driver    Social History Main Topics  . Smoking status: Never Smoker   . Smokeless tobacco: Never Used  . Alcohol Use: No  . Drug Use: No  . Sexual Activity: Not on file   Other Topics Concern  . Not on file   Social History Narrative   Lives with wife.      BP 124/74 mmHg  Pulse 88  Ht 5\' 7"  (1.702 m)  Wt 195 lb 12.8 oz (88.814 kg)  BMI 30.66 kg/m2  Physical Exam:  Well appearing 75 yo man, NAD HEENT: Unremarkable Neck:  6 cm JVD, no thyromegally Lymphatics:  No adenopathy Back:  No CVA tenderness Lungs:  Clear with no wheezes HEART:  Regular rate rhythm, no murmurs, no rubs, no clicks Abd:  soft, positive bowel sounds, no organomegally, no rebound, no guarding Ext:  2 plus pulses, no edema, no cyanosis, no clubbing Skin:  No rashes no nodules Neuro:  CN II through XII intact, motor grossly intact   DEVICE  Normal device function.  See PaceArt for details.   Assess/Plan: 1. Cryptogenic stroke - the patient has been found to have atrial fib on his ILR. I will start him on Xarelto.  We will stop ASA and plavix.  2. ILR - his device is working normally. Will recheck in several months.  3. CAD - he has not had a stent for 3 years. Will stop his ASA and plavix though I have some concerns about doing so, I am more concerned about bleeding with Xarelto and ASA or Plavix. 4. HTN - his blood pressure is controlled. Will follow.   Mikle Bosworth.D.

## 2015-12-05 NOTE — Progress Notes (Signed)
Carelink Summary Report / Loop Recorder 

## 2015-12-09 IMAGING — CT CT HEAD W/O CM
2 series · 16 of 30 positions shown, 20 images · non-contrast
Comparison: None.

CLINICAL DATA: Headache for several days

EXAM:
CT HEAD WITHOUT CONTRAST
TECHNIQUE: Contiguous axial images were obtained from the base of the skull
through the vertex without intravenous contrast.

[Series 201: head w/o, idose (1) · axial · non-contrast · 0.45mm/px · z∈[+99,+224]mm · 13 of 31 slices shown, 17 images]
[im 3/31  brain]
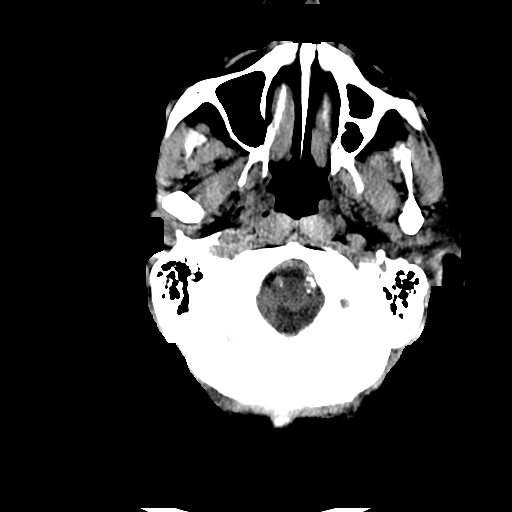
[im 3/31  bone]
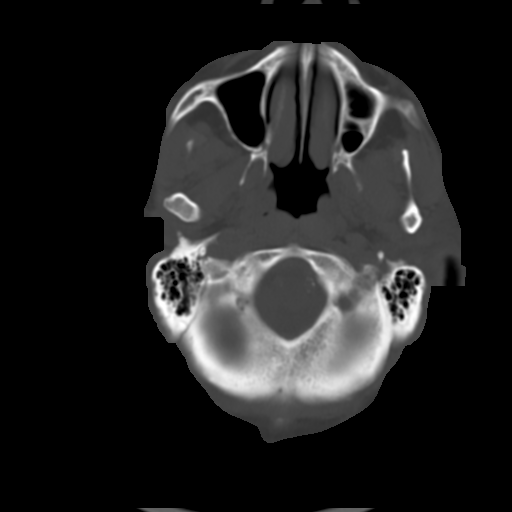
[im 5/31  brain]
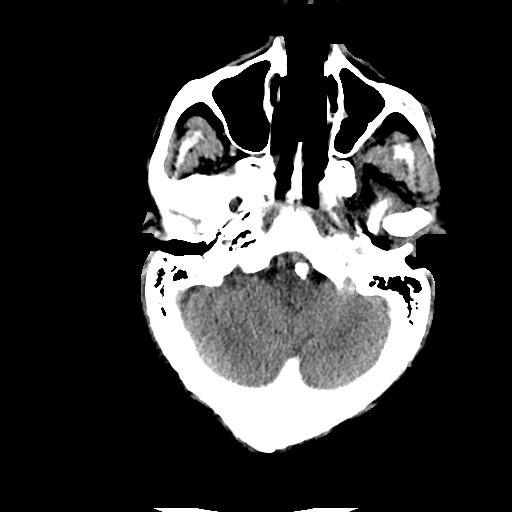
[im 7/31  brain]
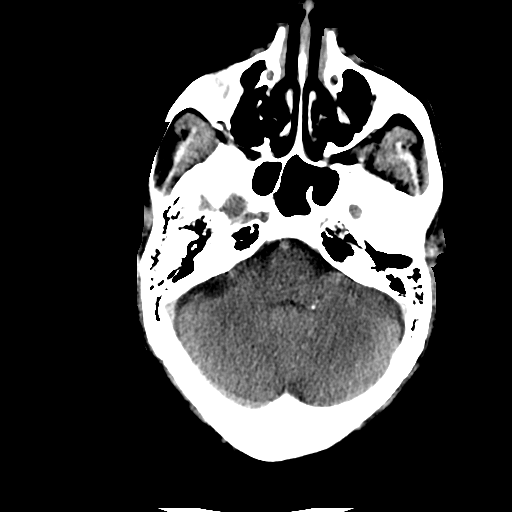
[im 9/31  brain]
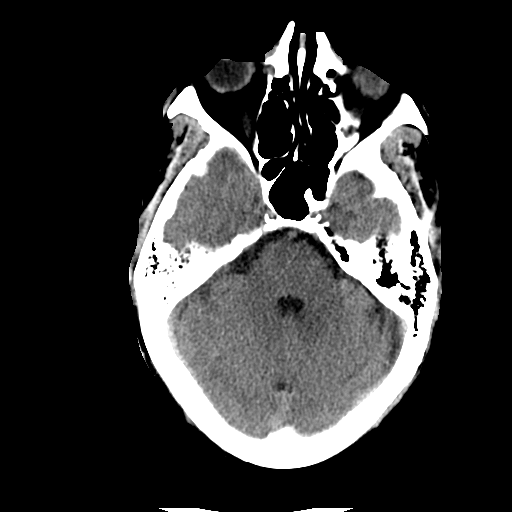
[im 11/31  brain]
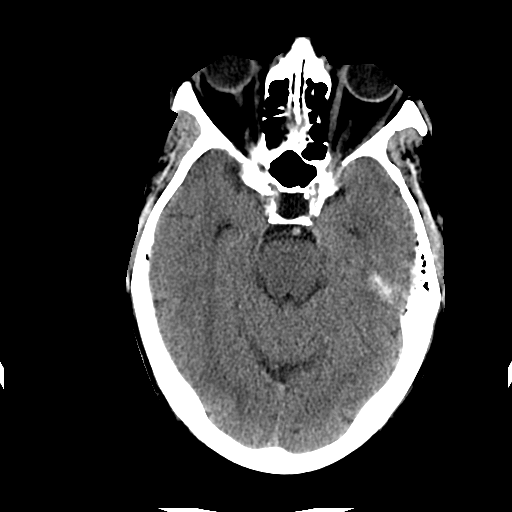
[im 11/31  bone]
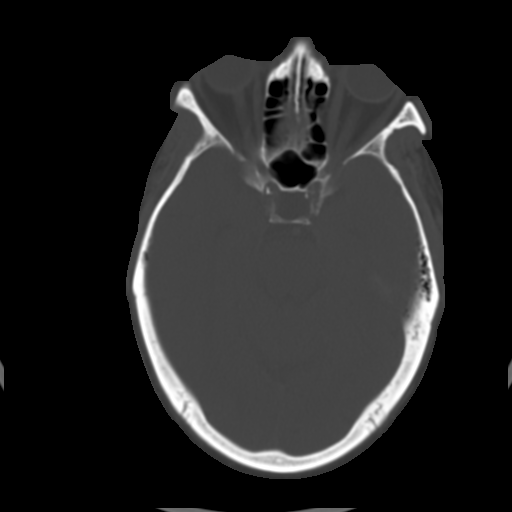
[im 13/31  brain]
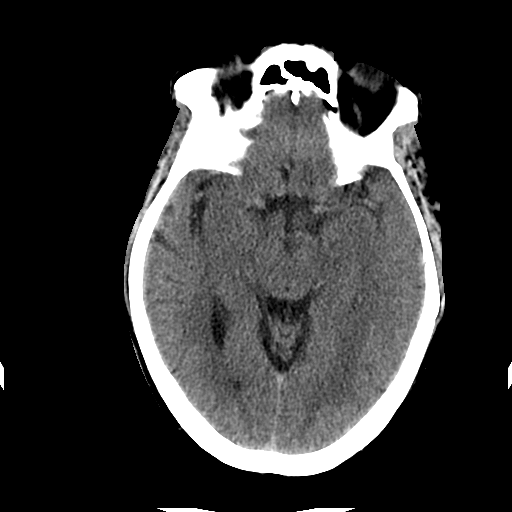
[im 16/31  brain]
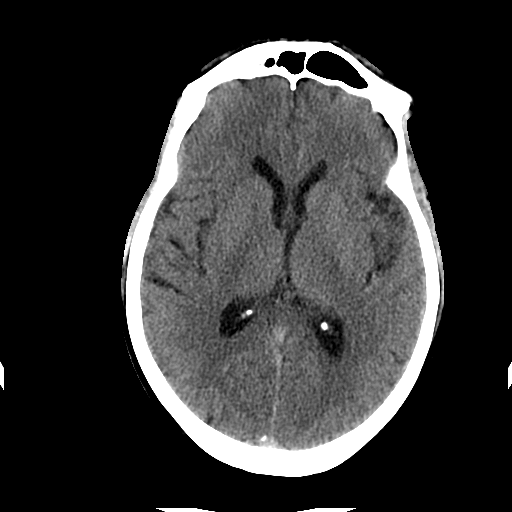
[im 18/31  brain]
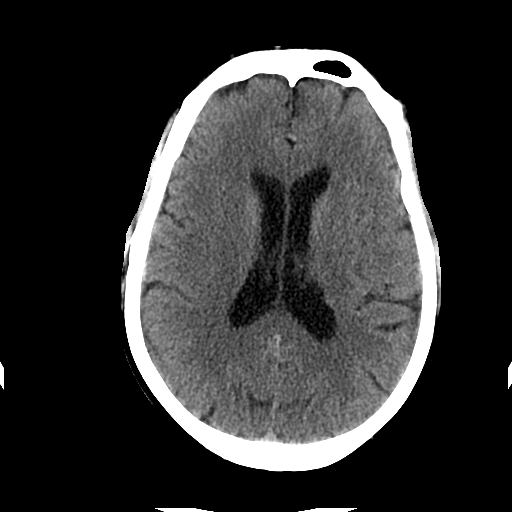
[im 20/31  brain]
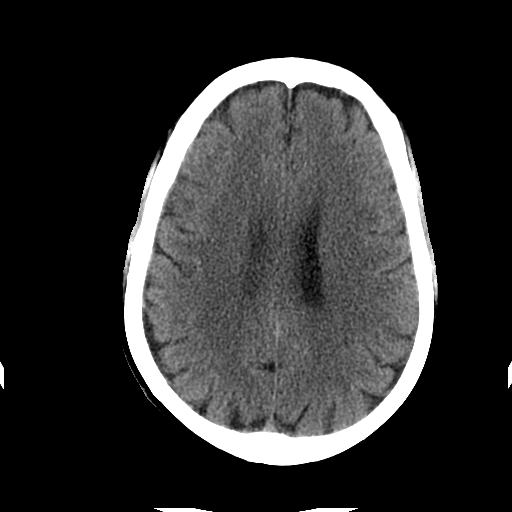
[im 20/31  bone]
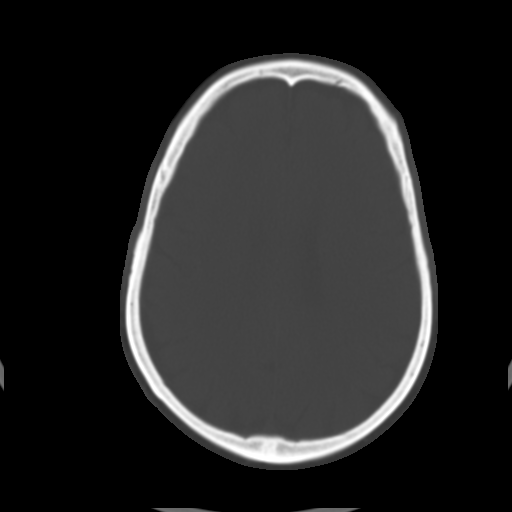
[im 22/31  brain]
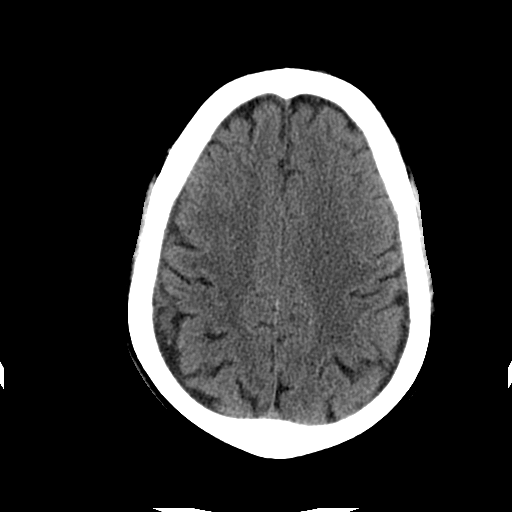
[im 24/31  brain]
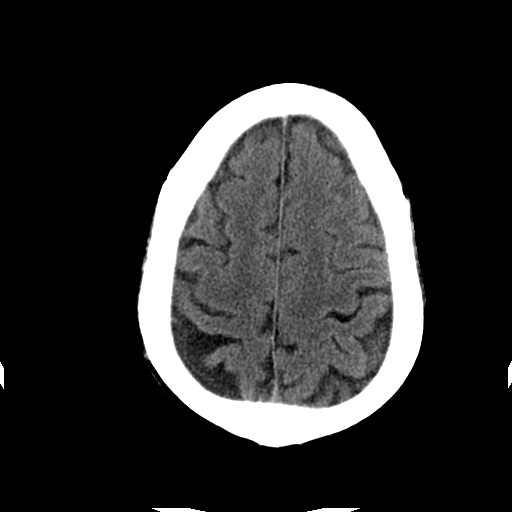
[im 26/31  brain]
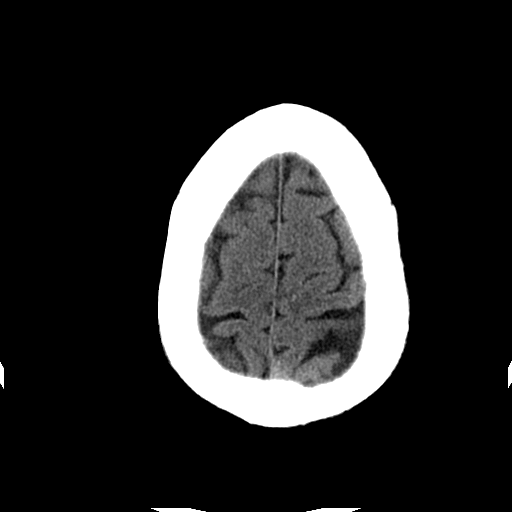
[im 28/31  brain]
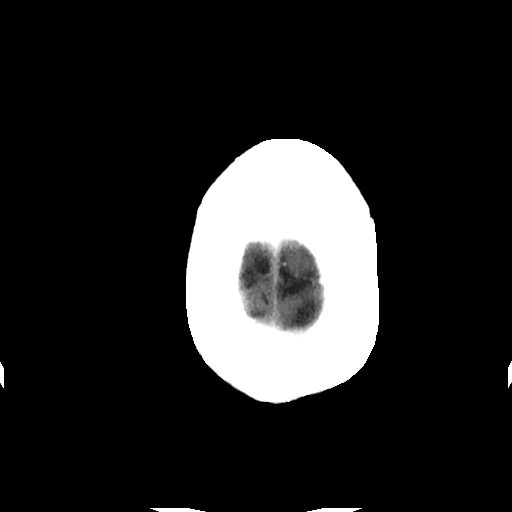
[im 28/31  bone]
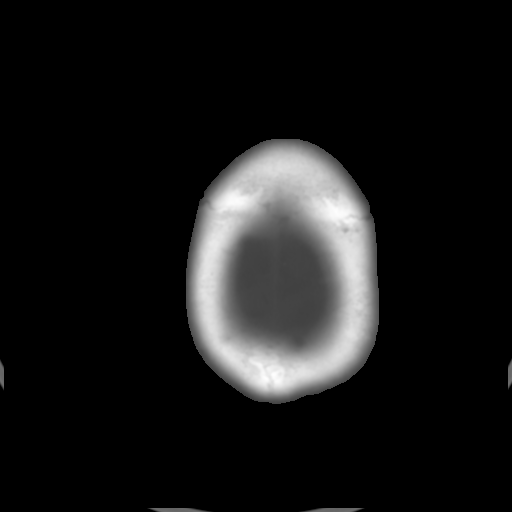

[Series 202: head w/o bone, idose (1) · axial · non-contrast · 0.45mm/px · z∈[+99,+139]mm · 3 of 31 slices shown]
[im 3/31  bone]
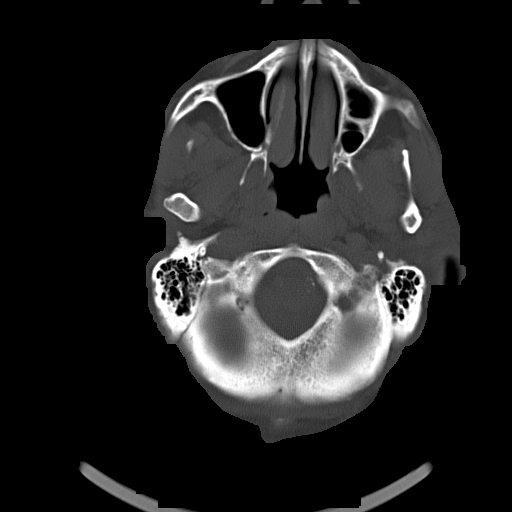
[im 7/31  bone]
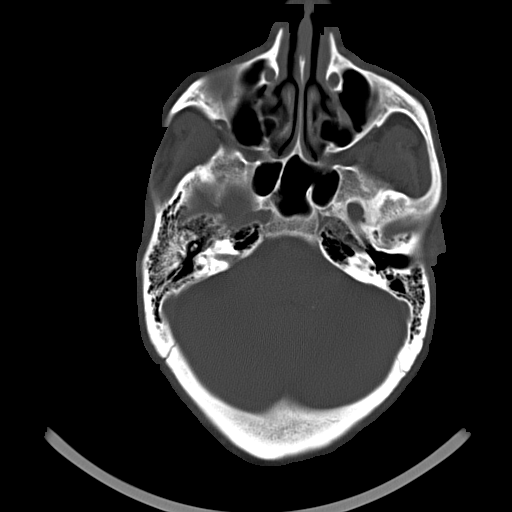
[im 11/31  bone]
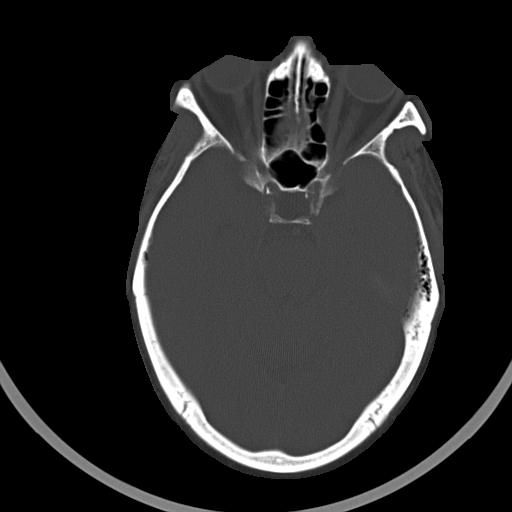

[16 of 30 positions shown; findings below may reference images not displayed]

FINDINGS: No acute intracranial hemorrhage. No focal mass lesion. No CT
evidence of acute infarction. No midline shift or mass effect. No
hydrocephalus. Basilar cisterns are patent. Mild periventricular
white matter hypodensities. Paranasal sinuses and mastoid air cells
are clear.
IMPRESSION: No acute intracranial findings. Mild white matter microvascular
disease.

## 2015-12-24 LAB — CUP PACEART REMOTE DEVICE CHECK: Date Time Interrogation Session: 20170127200631

## 2015-12-24 NOTE — Progress Notes (Signed)
[facility] summary report received. Battery status OK. Normal device function. No new symptom episodes, tachy episodes, brady, or pause episodes. 5 AF 0% of time- 1 available ECG- indeterminate due to artifact, PVCs noted, +Xarelto +ASA 81mg . Monthly summary reports and ROV/PRN

## 2016-01-03 ENCOUNTER — Ambulatory Visit (INDEPENDENT_AMBULATORY_CARE_PROVIDER_SITE_OTHER): Payer: Medicare Other | Admitting: *Deleted

## 2016-01-03 DIAGNOSIS — I639 Cerebral infarction, unspecified: Secondary | ICD-10-CM | POA: Diagnosis not present

## 2016-01-04 ENCOUNTER — Telehealth: Payer: Self-pay | Admitting: Cardiology

## 2016-01-04 LAB — CUP PACEART REMOTE DEVICE CHECK: Date Time Interrogation Session: 20170328203641

## 2016-01-04 NOTE — Progress Notes (Signed)
Carelink Summary Report / Loop Recorder 

## 2016-01-04 NOTE — Telephone Encounter (Signed)
Spoke w/ pt and requested that he send a manual transmission w/ his home monitor. Pt informed me that he mailed it back to the company and that he isn't going to follow his loop recorder right now. Pt said when he wanted to follow it in the future he would follow up w/ his [place].

## 2016-01-10 ENCOUNTER — Encounter: Payer: Self-pay | Admitting: Cardiology

## 2016-01-17 ENCOUNTER — Encounter: Payer: Self-pay | Admitting: Cardiology

## 2016-01-28 DIAGNOSIS — L03031 Cellulitis of right toe: Secondary | ICD-10-CM | POA: Diagnosis not present

## 2016-01-28 DIAGNOSIS — L600 Ingrowing nail: Secondary | ICD-10-CM | POA: Diagnosis not present

## 2016-01-30 DIAGNOSIS — I779 Disorder of arteries and arterioles, unspecified: Secondary | ICD-10-CM | POA: Diagnosis not present

## 2016-01-30 DIAGNOSIS — Z8679 Personal history of other diseases of the circulatory system: Secondary | ICD-10-CM | POA: Diagnosis not present

## 2016-01-30 DIAGNOSIS — D45 Polycythemia vera: Secondary | ICD-10-CM | POA: Diagnosis not present

## 2016-01-30 DIAGNOSIS — I25708 Atherosclerosis of coronary artery bypass graft(s), unspecified, with other forms of angina pectoris: Secondary | ICD-10-CM | POA: Diagnosis not present

## 2016-01-31 DIAGNOSIS — E785 Hyperlipidemia, unspecified: Secondary | ICD-10-CM | POA: Diagnosis not present

## 2016-01-31 DIAGNOSIS — Z794 Long term (current) use of insulin: Secondary | ICD-10-CM | POA: Diagnosis not present

## 2016-01-31 DIAGNOSIS — I10 Essential (primary) hypertension: Secondary | ICD-10-CM | POA: Diagnosis not present

## 2016-01-31 DIAGNOSIS — Z125 Encounter for screening for malignant neoplasm of prostate: Secondary | ICD-10-CM | POA: Diagnosis not present

## 2016-01-31 DIAGNOSIS — M159 Polyosteoarthritis, unspecified: Secondary | ICD-10-CM | POA: Diagnosis not present

## 2016-01-31 DIAGNOSIS — E119 Type 2 diabetes mellitus without complications: Secondary | ICD-10-CM | POA: Diagnosis not present

## 2016-01-31 DIAGNOSIS — R2231 Localized swelling, mass and lump, right upper limb: Secondary | ICD-10-CM | POA: Diagnosis not present

## 2016-01-31 DIAGNOSIS — I25810 Atherosclerosis of coronary artery bypass graft(s) without angina pectoris: Secondary | ICD-10-CM | POA: Diagnosis not present

## 2016-02-01 DIAGNOSIS — I10 Essential (primary) hypertension: Secondary | ICD-10-CM | POA: Diagnosis not present

## 2016-02-01 DIAGNOSIS — I25708 Atherosclerosis of coronary artery bypass graft(s), unspecified, with other forms of angina pectoris: Secondary | ICD-10-CM | POA: Diagnosis not present

## 2016-02-01 DIAGNOSIS — E7800 Pure hypercholesterolemia, unspecified: Secondary | ICD-10-CM | POA: Diagnosis not present

## 2016-02-01 DIAGNOSIS — D45 Polycythemia vera: Secondary | ICD-10-CM | POA: Diagnosis not present

## 2016-02-01 DIAGNOSIS — E119 Type 2 diabetes mellitus without complications: Secondary | ICD-10-CM | POA: Diagnosis not present

## 2016-02-02 ENCOUNTER — Encounter: Payer: Medicare Other | Admitting: *Deleted

## 2016-02-03 ENCOUNTER — Telehealth: Payer: Self-pay | Admitting: Hematology

## 2016-02-03 ENCOUNTER — Telehealth: Payer: Self-pay | Admitting: Internal Medicine

## 2016-02-03 DIAGNOSIS — R002 Palpitations: Secondary | ICD-10-CM | POA: Diagnosis not present

## 2016-02-03 NOTE — Telephone Encounter (Signed)
Pt called to request records be release to Dr. Thayer Ohm

## 2016-02-03 NOTE — Telephone Encounter (Signed)
Informed pt that the office in [place] that he is seeing needs to send [place] a patient transfer request in [employer] [facility] and we will release him to them. Pt verbalized understanding.

## 2016-02-03 NOTE — Telephone Encounter (Signed)
Spoke with patient- I have released patient in [facility] for Dr. Golden Hurter at [facility] in [place]. (Ph) [phone], (Fax) [phone] He is requesting his records be sent to their office- I will transfer the call to Medical Records.

## 2016-02-03 NOTE — Telephone Encounter (Signed)
Faxed pt medical records to Dr. Delane Ginger [phone]

## 2016-02-03 NOTE — Telephone Encounter (Signed)
New [place]ssage  Pt requested to speak w/ Device concerning transferring his [employer] services to [place] in [place]. Please call back and discuss.

## 2016-02-06 DIAGNOSIS — E119 Type 2 diabetes mellitus without complications: Secondary | ICD-10-CM | POA: Diagnosis not present

## 2016-02-06 DIAGNOSIS — I25708 Atherosclerosis of coronary artery bypass graft(s), unspecified, with other forms of angina pectoris: Secondary | ICD-10-CM | POA: Diagnosis not present

## 2016-02-06 DIAGNOSIS — I639 Cerebral infarction, unspecified: Secondary | ICD-10-CM | POA: Diagnosis not present

## 2016-02-06 DIAGNOSIS — Z955 Presence of coronary angioplasty implant and graft: Secondary | ICD-10-CM | POA: Diagnosis not present

## 2016-02-11 DIAGNOSIS — L600 Ingrowing nail: Secondary | ICD-10-CM | POA: Diagnosis not present

## 2016-02-14 DIAGNOSIS — R2231 Localized swelling, mass and lump, right upper limb: Secondary | ICD-10-CM | POA: Diagnosis not present

## 2016-02-16 DIAGNOSIS — I10 Essential (primary) hypertension: Secondary | ICD-10-CM | POA: Diagnosis not present

## 2016-02-16 DIAGNOSIS — N400 Enlarged prostate without lower urinary tract symptoms: Secondary | ICD-10-CM | POA: Diagnosis not present

## 2016-02-16 DIAGNOSIS — R9439 Abnormal result of other cardiovascular function study: Secondary | ICD-10-CM | POA: Diagnosis not present

## 2016-02-16 DIAGNOSIS — Z8679 Personal history of other diseases of the circulatory system: Secondary | ICD-10-CM | POA: Diagnosis not present

## 2016-02-16 DIAGNOSIS — N401 Enlarged prostate with lower urinary tract symptoms: Secondary | ICD-10-CM | POA: Diagnosis not present

## 2016-02-16 DIAGNOSIS — I25708 Atherosclerosis of coronary artery bypass graft(s), unspecified, with other forms of angina pectoris: Secondary | ICD-10-CM | POA: Diagnosis not present

## 2016-02-16 DIAGNOSIS — E119 Type 2 diabetes mellitus without complications: Secondary | ICD-10-CM | POA: Diagnosis not present

## 2016-02-20 LAB — CUP PACEART REMOTE DEVICE CHECK: Date Time Interrogation Session: 20170226203656

## 2016-02-21 ENCOUNTER — Encounter: Payer: Self-pay | Admitting: Surgery

## 2016-02-22 DIAGNOSIS — G4733 Obstructive sleep apnea (adult) (pediatric): Secondary | ICD-10-CM | POA: Diagnosis not present

## 2016-02-22 DIAGNOSIS — K219 Gastro-esophageal reflux disease without esophagitis: Secondary | ICD-10-CM | POA: Diagnosis not present

## 2016-02-22 DIAGNOSIS — I509 Heart failure, unspecified: Secondary | ICD-10-CM | POA: Diagnosis not present

## 2016-02-22 DIAGNOSIS — Z79899 Other long term (current) drug therapy: Secondary | ICD-10-CM | POA: Diagnosis not present

## 2016-02-22 DIAGNOSIS — I25110 Atherosclerotic heart disease of native coronary artery with unstable angina pectoris: Secondary | ICD-10-CM | POA: Diagnosis not present

## 2016-02-22 DIAGNOSIS — R9439 Abnormal result of other cardiovascular function study: Secondary | ICD-10-CM | POA: Diagnosis not present

## 2016-02-22 DIAGNOSIS — Z7984 Long term (current) use of oral hypoglycemic drugs: Secondary | ICD-10-CM | POA: Diagnosis not present

## 2016-02-22 DIAGNOSIS — I110 Hypertensive heart disease with heart failure: Secondary | ICD-10-CM | POA: Diagnosis not present

## 2016-02-22 DIAGNOSIS — D72829 Elevated white blood cell count, unspecified: Secondary | ICD-10-CM | POA: Diagnosis not present

## 2016-02-22 DIAGNOSIS — Z794 Long term (current) use of insulin: Secondary | ICD-10-CM | POA: Diagnosis not present

## 2016-02-22 DIAGNOSIS — E1165 Type 2 diabetes mellitus with hyperglycemia: Secondary | ICD-10-CM | POA: Diagnosis not present

## 2016-02-22 DIAGNOSIS — R569 Unspecified convulsions: Secondary | ICD-10-CM | POA: Diagnosis not present

## 2016-02-22 DIAGNOSIS — I2510 Atherosclerotic heart disease of native coronary artery without angina pectoris: Secondary | ICD-10-CM | POA: Diagnosis not present

## 2016-02-22 DIAGNOSIS — R002 Palpitations: Secondary | ICD-10-CM | POA: Diagnosis not present

## 2016-02-22 DIAGNOSIS — Z9861 Coronary angioplasty status: Secondary | ICD-10-CM | POA: Diagnosis not present

## 2016-02-22 DIAGNOSIS — I739 Peripheral vascular disease, unspecified: Secondary | ICD-10-CM | POA: Diagnosis not present

## 2016-02-22 DIAGNOSIS — E785 Hyperlipidemia, unspecified: Secondary | ICD-10-CM | POA: Diagnosis not present

## 2016-02-22 DIAGNOSIS — G4730 Sleep apnea, unspecified: Secondary | ICD-10-CM | POA: Diagnosis not present

## 2016-02-22 DIAGNOSIS — E7800 Pure hypercholesterolemia, unspecified: Secondary | ICD-10-CM | POA: Diagnosis not present

## 2016-02-22 DIAGNOSIS — R079 Chest pain, unspecified: Secondary | ICD-10-CM | POA: Diagnosis not present

## 2016-02-22 DIAGNOSIS — I200 Unstable angina: Secondary | ICD-10-CM | POA: Diagnosis not present

## 2016-02-22 DIAGNOSIS — R05 Cough: Secondary | ICD-10-CM | POA: Diagnosis not present

## 2016-02-22 DIAGNOSIS — I10 Essential (primary) hypertension: Secondary | ICD-10-CM | POA: Diagnosis not present

## 2016-02-22 DIAGNOSIS — Z8673 Personal history of transient ischemic attack (TIA), and cerebral infarction without residual deficits: Secondary | ICD-10-CM | POA: Diagnosis not present

## 2016-02-22 DIAGNOSIS — Z951 Presence of aortocoronary bypass graft: Secondary | ICD-10-CM | POA: Diagnosis not present

## 2016-02-22 DIAGNOSIS — Z7982 Long term (current) use of aspirin: Secondary | ICD-10-CM | POA: Diagnosis not present

## 2016-02-22 DIAGNOSIS — R0602 Shortness of breath: Secondary | ICD-10-CM | POA: Diagnosis not present

## 2016-02-22 DIAGNOSIS — I4891 Unspecified atrial fibrillation: Secondary | ICD-10-CM | POA: Diagnosis not present

## 2016-02-23 DIAGNOSIS — I739 Peripheral vascular disease, unspecified: Secondary | ICD-10-CM | POA: Diagnosis not present

## 2016-02-23 DIAGNOSIS — I25110 Atherosclerotic heart disease of native coronary artery with unstable angina pectoris: Secondary | ICD-10-CM | POA: Diagnosis not present

## 2016-02-23 DIAGNOSIS — I517 Cardiomegaly: Secondary | ICD-10-CM | POA: Diagnosis not present

## 2016-02-23 DIAGNOSIS — E1165 Type 2 diabetes mellitus with hyperglycemia: Secondary | ICD-10-CM | POA: Diagnosis not present

## 2016-02-23 DIAGNOSIS — I509 Heart failure, unspecified: Secondary | ICD-10-CM | POA: Diagnosis not present

## 2016-02-23 DIAGNOSIS — R05 Cough: Secondary | ICD-10-CM | POA: Diagnosis not present

## 2016-02-23 DIAGNOSIS — I200 Unstable angina: Secondary | ICD-10-CM | POA: Diagnosis not present

## 2016-02-23 DIAGNOSIS — I2510 Atherosclerotic heart disease of native coronary artery without angina pectoris: Secondary | ICD-10-CM | POA: Diagnosis not present

## 2016-02-23 DIAGNOSIS — I10 Essential (primary) hypertension: Secondary | ICD-10-CM | POA: Diagnosis not present

## 2016-02-23 DIAGNOSIS — I110 Hypertensive heart disease with heart failure: Secondary | ICD-10-CM | POA: Diagnosis not present

## 2016-02-23 DIAGNOSIS — Z9861 Coronary angioplasty status: Secondary | ICD-10-CM | POA: Diagnosis not present

## 2016-02-23 DIAGNOSIS — Z951 Presence of aortocoronary bypass graft: Secondary | ICD-10-CM | POA: Diagnosis not present

## 2016-02-27 ENCOUNTER — Ambulatory Visit: Payer: Medicare Other | Admitting: Surgery

## 2016-02-27 ENCOUNTER — Ambulatory Visit (HOSPITAL_COMMUNITY): Payer: Medicare Other

## 2016-02-29 DIAGNOSIS — D7581 Myelofibrosis: Secondary | ICD-10-CM | POA: Diagnosis not present

## 2016-02-29 DIAGNOSIS — D471 Chronic myeloproliferative disease: Secondary | ICD-10-CM | POA: Diagnosis not present

## 2016-03-06 DIAGNOSIS — Z8679 Personal history of other diseases of the circulatory system: Secondary | ICD-10-CM | POA: Diagnosis not present

## 2016-03-06 DIAGNOSIS — I779 Disorder of arteries and arterioles, unspecified: Secondary | ICD-10-CM | POA: Diagnosis not present

## 2016-03-06 DIAGNOSIS — I25708 Atherosclerosis of coronary artery bypass graft(s), unspecified, with other forms of angina pectoris: Secondary | ICD-10-CM | POA: Diagnosis not present

## 2016-03-21 DIAGNOSIS — I509 Heart failure, unspecified: Secondary | ICD-10-CM | POA: Diagnosis present

## 2016-03-21 DIAGNOSIS — I998 Other disorder of circulatory system: Secondary | ICD-10-CM | POA: Diagnosis present

## 2016-03-21 DIAGNOSIS — I25810 Atherosclerosis of coronary artery bypass graft(s) without angina pectoris: Secondary | ICD-10-CM | POA: Diagnosis present

## 2016-03-21 DIAGNOSIS — D72829 Elevated white blood cell count, unspecified: Secondary | ICD-10-CM | POA: Diagnosis present

## 2016-03-21 DIAGNOSIS — I739 Peripheral vascular disease, unspecified: Secondary | ICD-10-CM | POA: Diagnosis not present

## 2016-03-21 DIAGNOSIS — K219 Gastro-esophageal reflux disease without esophagitis: Secondary | ICD-10-CM | POA: Diagnosis present

## 2016-03-21 DIAGNOSIS — I110 Hypertensive heart disease with heart failure: Secondary | ICD-10-CM | POA: Diagnosis present

## 2016-03-21 DIAGNOSIS — R079 Chest pain, unspecified: Secondary | ICD-10-CM | POA: Diagnosis not present

## 2016-03-21 DIAGNOSIS — I482 Chronic atrial fibrillation: Secondary | ICD-10-CM | POA: Diagnosis present

## 2016-03-21 DIAGNOSIS — I70218 Atherosclerosis of native arteries of extremities with intermittent claudication, other extremity: Secondary | ICD-10-CM | POA: Diagnosis present

## 2016-03-21 DIAGNOSIS — G4730 Sleep apnea, unspecified: Secondary | ICD-10-CM | POA: Diagnosis present

## 2016-03-21 DIAGNOSIS — I70222 Atherosclerosis of native arteries of extremities with rest pain, left leg: Secondary | ICD-10-CM | POA: Diagnosis not present

## 2016-03-21 DIAGNOSIS — D751 Secondary polycythemia: Secondary | ICD-10-CM | POA: Diagnosis present

## 2016-03-21 DIAGNOSIS — E1151 Type 2 diabetes mellitus with diabetic peripheral angiopathy without gangrene: Secondary | ICD-10-CM | POA: Diagnosis present

## 2016-03-21 DIAGNOSIS — I7092 Chronic total occlusion of artery of the extremities: Secondary | ICD-10-CM | POA: Diagnosis present

## 2016-03-21 DIAGNOSIS — Z8673 Personal history of transient ischemic attack (TIA), and cerebral infarction without residual deficits: Secondary | ICD-10-CM | POA: Diagnosis not present

## 2016-03-21 DIAGNOSIS — Z8249 Family history of ischemic heart disease and other diseases of the circulatory system: Secondary | ICD-10-CM | POA: Diagnosis not present

## 2016-03-21 DIAGNOSIS — R0789 Other chest pain: Secondary | ICD-10-CM | POA: Diagnosis not present

## 2016-03-21 DIAGNOSIS — E7800 Pure hypercholesterolemia, unspecified: Secondary | ICD-10-CM | POA: Diagnosis present

## 2016-03-21 DIAGNOSIS — I7389 Other specified peripheral vascular diseases: Secondary | ICD-10-CM | POA: Diagnosis not present

## 2016-03-21 DIAGNOSIS — Z955 Presence of coronary angioplasty implant and graft: Secondary | ICD-10-CM | POA: Diagnosis not present

## 2016-03-21 DIAGNOSIS — Z825 Family history of asthma and other chronic lower respiratory diseases: Secondary | ICD-10-CM | POA: Diagnosis not present

## 2016-03-22 DIAGNOSIS — I743 Embolism and thrombosis of arteries of the lower extremities: Secondary | ICD-10-CM | POA: Diagnosis not present

## 2016-03-22 DIAGNOSIS — I110 Hypertensive heart disease with heart failure: Secondary | ICD-10-CM | POA: Diagnosis not present

## 2016-03-22 DIAGNOSIS — I771 Stricture of artery: Secondary | ICD-10-CM | POA: Diagnosis not present

## 2016-03-22 DIAGNOSIS — G4733 Obstructive sleep apnea (adult) (pediatric): Secondary | ICD-10-CM | POA: Diagnosis present

## 2016-03-22 DIAGNOSIS — R404 Transient alteration of awareness: Secondary | ICD-10-CM | POA: Diagnosis not present

## 2016-03-22 DIAGNOSIS — R2689 Other abnormalities of gait and mobility: Secondary | ICD-10-CM | POA: Diagnosis not present

## 2016-03-22 DIAGNOSIS — I25810 Atherosclerosis of coronary artery bypass graft(s) without angina pectoris: Secondary | ICD-10-CM | POA: Diagnosis not present

## 2016-03-22 DIAGNOSIS — I739 Peripheral vascular disease, unspecified: Secondary | ICD-10-CM | POA: Diagnosis not present

## 2016-03-22 DIAGNOSIS — I25708 Atherosclerosis of coronary artery bypass graft(s), unspecified, with other forms of angina pectoris: Secondary | ICD-10-CM | POA: Diagnosis not present

## 2016-03-22 DIAGNOSIS — T40605A Adverse effect of unspecified narcotics, initial encounter: Secondary | ICD-10-CM | POA: Diagnosis not present

## 2016-03-22 DIAGNOSIS — G9340 Encephalopathy, unspecified: Secondary | ICD-10-CM | POA: Diagnosis present

## 2016-03-22 DIAGNOSIS — I7092 Chronic total occlusion of artery of the extremities: Secondary | ICD-10-CM | POA: Diagnosis not present

## 2016-03-22 DIAGNOSIS — E1151 Type 2 diabetes mellitus with diabetic peripheral angiopathy without gangrene: Secondary | ICD-10-CM | POA: Diagnosis not present

## 2016-03-22 DIAGNOSIS — I509 Heart failure, unspecified: Secondary | ICD-10-CM | POA: Diagnosis not present

## 2016-03-22 DIAGNOSIS — I70201 Unspecified atherosclerosis of native arteries of extremities, right leg: Secondary | ICD-10-CM | POA: Diagnosis not present

## 2016-03-22 DIAGNOSIS — I2510 Atherosclerotic heart disease of native coronary artery without angina pectoris: Secondary | ICD-10-CM | POA: Diagnosis not present

## 2016-03-22 DIAGNOSIS — Z743 Need for continuous supervision: Secondary | ICD-10-CM | POA: Diagnosis not present

## 2016-03-22 DIAGNOSIS — Z89511 Acquired absence of right leg below knee: Secondary | ICD-10-CM | POA: Diagnosis not present

## 2016-03-22 DIAGNOSIS — D649 Anemia, unspecified: Secondary | ICD-10-CM | POA: Diagnosis present

## 2016-03-22 DIAGNOSIS — Z89519 Acquired absence of unspecified leg below knee: Secondary | ICD-10-CM | POA: Diagnosis not present

## 2016-03-22 DIAGNOSIS — Z951 Presence of aortocoronary bypass graft: Secondary | ICD-10-CM | POA: Diagnosis not present

## 2016-03-22 DIAGNOSIS — R079 Chest pain, unspecified: Secondary | ICD-10-CM | POA: Diagnosis not present

## 2016-03-22 DIAGNOSIS — E1152 Type 2 diabetes mellitus with diabetic peripheral angiopathy with gangrene: Secondary | ICD-10-CM | POA: Diagnosis not present

## 2016-03-22 DIAGNOSIS — Z01818 Encounter for other preprocedural examination: Secondary | ICD-10-CM | POA: Diagnosis not present

## 2016-03-22 DIAGNOSIS — I4891 Unspecified atrial fibrillation: Secondary | ICD-10-CM | POA: Diagnosis not present

## 2016-03-22 DIAGNOSIS — I482 Chronic atrial fibrillation: Secondary | ICD-10-CM | POA: Diagnosis present

## 2016-03-22 DIAGNOSIS — Z8673 Personal history of transient ischemic attack (TIA), and cerebral infarction without residual deficits: Secondary | ICD-10-CM | POA: Diagnosis not present

## 2016-03-22 DIAGNOSIS — R410 Disorientation, unspecified: Secondary | ICD-10-CM | POA: Diagnosis not present

## 2016-03-22 DIAGNOSIS — D473 Essential (hemorrhagic) thrombocythemia: Secondary | ICD-10-CM | POA: Diagnosis present

## 2016-03-22 DIAGNOSIS — I4510 Unspecified right bundle-branch block: Secondary | ICD-10-CM | POA: Diagnosis present

## 2016-03-22 DIAGNOSIS — K219 Gastro-esophageal reflux disease without esophagitis: Secondary | ICD-10-CM | POA: Diagnosis present

## 2016-03-22 DIAGNOSIS — I70222 Atherosclerosis of native arteries of extremities with rest pain, left leg: Secondary | ICD-10-CM | POA: Diagnosis not present

## 2016-03-22 DIAGNOSIS — D72829 Elevated white blood cell count, unspecified: Secondary | ICD-10-CM | POA: Diagnosis present

## 2016-03-22 DIAGNOSIS — I10 Essential (primary) hypertension: Secondary | ICD-10-CM | POA: Diagnosis not present

## 2016-03-22 DIAGNOSIS — M6281 Muscle weakness (generalized): Secondary | ICD-10-CM | POA: Diagnosis not present

## 2016-03-22 DIAGNOSIS — E785 Hyperlipidemia, unspecified: Secondary | ICD-10-CM | POA: Diagnosis present

## 2016-03-22 DIAGNOSIS — T85868A Thrombosis due to other internal prosthetic devices, implants and grafts, initial encounter: Secondary | ICD-10-CM | POA: Diagnosis not present

## 2016-03-22 DIAGNOSIS — D751 Secondary polycythemia: Secondary | ICD-10-CM | POA: Diagnosis present

## 2016-03-22 DIAGNOSIS — I70218 Atherosclerosis of native arteries of extremities with intermittent claudication, other extremity: Secondary | ICD-10-CM | POA: Diagnosis not present

## 2016-03-22 DIAGNOSIS — G9349 Other encephalopathy: Secondary | ICD-10-CM | POA: Diagnosis not present

## 2016-03-22 DIAGNOSIS — Z955 Presence of coronary angioplasty implant and graft: Secondary | ICD-10-CM | POA: Diagnosis not present

## 2016-03-22 DIAGNOSIS — G40909 Epilepsy, unspecified, not intractable, without status epilepticus: Secondary | ICD-10-CM | POA: Diagnosis present

## 2016-03-22 DIAGNOSIS — L089 Local infection of the skin and subcutaneous tissue, unspecified: Secondary | ICD-10-CM | POA: Diagnosis not present

## 2016-03-22 DIAGNOSIS — I998 Other disorder of circulatory system: Secondary | ICD-10-CM | POA: Diagnosis not present

## 2016-03-22 DIAGNOSIS — E119 Type 2 diabetes mellitus without complications: Secondary | ICD-10-CM | POA: Diagnosis not present

## 2016-03-22 DIAGNOSIS — D7289 Other specified disorders of white blood cells: Secondary | ICD-10-CM | POA: Diagnosis not present

## 2016-03-31 DIAGNOSIS — Z7982 Long term (current) use of aspirin: Secondary | ICD-10-CM | POA: Diagnosis not present

## 2016-03-31 DIAGNOSIS — E1152 Type 2 diabetes mellitus with diabetic peripheral angiopathy with gangrene: Secondary | ICD-10-CM | POA: Diagnosis not present

## 2016-03-31 DIAGNOSIS — I10 Essential (primary) hypertension: Secondary | ICD-10-CM | POA: Diagnosis not present

## 2016-03-31 DIAGNOSIS — E663 Overweight: Secondary | ICD-10-CM | POA: Diagnosis present

## 2016-03-31 DIAGNOSIS — I7389 Other specified peripheral vascular diseases: Secondary | ICD-10-CM | POA: Diagnosis not present

## 2016-03-31 DIAGNOSIS — Z794 Long term (current) use of insulin: Secondary | ICD-10-CM | POA: Diagnosis not present

## 2016-03-31 DIAGNOSIS — E871 Hypo-osmolality and hyponatremia: Secondary | ICD-10-CM | POA: Diagnosis present

## 2016-03-31 DIAGNOSIS — G9349 Other encephalopathy: Secondary | ICD-10-CM | POA: Diagnosis not present

## 2016-03-31 DIAGNOSIS — R05 Cough: Secondary | ICD-10-CM | POA: Diagnosis not present

## 2016-03-31 DIAGNOSIS — Z8673 Personal history of transient ischemic attack (TIA), and cerebral infarction without residual deficits: Secondary | ICD-10-CM | POA: Diagnosis not present

## 2016-03-31 DIAGNOSIS — I25708 Atherosclerosis of coronary artery bypass graft(s), unspecified, with other forms of angina pectoris: Secondary | ICD-10-CM | POA: Diagnosis not present

## 2016-03-31 DIAGNOSIS — F0390 Unspecified dementia without behavioral disturbance: Secondary | ICD-10-CM | POA: Diagnosis present

## 2016-03-31 DIAGNOSIS — R569 Unspecified convulsions: Secondary | ICD-10-CM | POA: Diagnosis present

## 2016-03-31 DIAGNOSIS — I739 Peripheral vascular disease, unspecified: Secondary | ICD-10-CM | POA: Diagnosis not present

## 2016-03-31 DIAGNOSIS — D45 Polycythemia vera: Secondary | ICD-10-CM | POA: Diagnosis present

## 2016-03-31 DIAGNOSIS — E119 Type 2 diabetes mellitus without complications: Secondary | ICD-10-CM | POA: Diagnosis not present

## 2016-03-31 DIAGNOSIS — Z955 Presence of coronary angioplasty implant and graft: Secondary | ICD-10-CM | POA: Diagnosis not present

## 2016-03-31 DIAGNOSIS — Z89511 Acquired absence of right leg below knee: Secondary | ICD-10-CM | POA: Diagnosis not present

## 2016-03-31 DIAGNOSIS — K6811 Postprocedural retroperitoneal abscess: Secondary | ICD-10-CM | POA: Diagnosis not present

## 2016-03-31 DIAGNOSIS — Z89519 Acquired absence of unspecified leg below knee: Secondary | ICD-10-CM | POA: Diagnosis not present

## 2016-03-31 DIAGNOSIS — T8743 Infection of amputation stump, right lower extremity: Secondary | ICD-10-CM | POA: Diagnosis not present

## 2016-03-31 DIAGNOSIS — A4159 Other Gram-negative sepsis: Secondary | ICD-10-CM | POA: Diagnosis present

## 2016-03-31 DIAGNOSIS — T814XXA Infection following a procedure, initial encounter: Secondary | ICD-10-CM | POA: Diagnosis not present

## 2016-03-31 DIAGNOSIS — F0630 Mood disorder due to known physiological condition, unspecified: Secondary | ICD-10-CM | POA: Diagnosis not present

## 2016-03-31 DIAGNOSIS — F331 Major depressive disorder, recurrent, moderate: Secondary | ICD-10-CM | POA: Diagnosis not present

## 2016-03-31 DIAGNOSIS — Z743 Need for continuous supervision: Secondary | ICD-10-CM | POA: Diagnosis not present

## 2016-03-31 DIAGNOSIS — G4733 Obstructive sleep apnea (adult) (pediatric): Secondary | ICD-10-CM | POA: Diagnosis present

## 2016-03-31 DIAGNOSIS — E1165 Type 2 diabetes mellitus with hyperglycemia: Secondary | ICD-10-CM | POA: Diagnosis present

## 2016-03-31 DIAGNOSIS — F064 Anxiety disorder due to known physiological condition: Secondary | ICD-10-CM | POA: Diagnosis not present

## 2016-03-31 DIAGNOSIS — R58 Hemorrhage, not elsewhere classified: Secondary | ICD-10-CM | POA: Diagnosis not present

## 2016-03-31 DIAGNOSIS — R404 Transient alteration of awareness: Secondary | ICD-10-CM | POA: Diagnosis not present

## 2016-03-31 DIAGNOSIS — I70203 Unspecified atherosclerosis of native arteries of extremities, bilateral legs: Secondary | ICD-10-CM | POA: Diagnosis present

## 2016-03-31 DIAGNOSIS — Z6827 Body mass index (BMI) 27.0-27.9, adult: Secondary | ICD-10-CM | POA: Diagnosis not present

## 2016-03-31 DIAGNOSIS — I2510 Atherosclerotic heart disease of native coronary artery without angina pectoris: Secondary | ICD-10-CM | POA: Diagnosis not present

## 2016-03-31 DIAGNOSIS — Y838 Other surgical procedures as the cause of abnormal reaction of the patient, or of later complication, without mention of misadventure at the time of the procedure: Secondary | ICD-10-CM | POA: Diagnosis not present

## 2016-03-31 DIAGNOSIS — Z951 Presence of aortocoronary bypass graft: Secondary | ICD-10-CM | POA: Diagnosis not present

## 2016-03-31 DIAGNOSIS — Z7901 Long term (current) use of anticoagulants: Secondary | ICD-10-CM | POA: Diagnosis not present

## 2016-03-31 DIAGNOSIS — I4891 Unspecified atrial fibrillation: Secondary | ICD-10-CM | POA: Diagnosis present

## 2016-03-31 DIAGNOSIS — D649 Anemia, unspecified: Secondary | ICD-10-CM | POA: Diagnosis present

## 2016-03-31 DIAGNOSIS — I484 Atypical atrial flutter: Secondary | ICD-10-CM | POA: Diagnosis not present

## 2016-03-31 DIAGNOSIS — R2689 Other abnormalities of gait and mobility: Secondary | ICD-10-CM | POA: Diagnosis not present

## 2016-03-31 DIAGNOSIS — D473 Essential (hemorrhagic) thrombocythemia: Secondary | ICD-10-CM | POA: Diagnosis present

## 2016-03-31 DIAGNOSIS — Z87891 Personal history of nicotine dependence: Secondary | ICD-10-CM | POA: Diagnosis not present

## 2016-03-31 DIAGNOSIS — F4325 Adjustment disorder with mixed disturbance of emotions and conduct: Secondary | ICD-10-CM | POA: Diagnosis not present

## 2016-03-31 DIAGNOSIS — M6281 Muscle weakness (generalized): Secondary | ICD-10-CM | POA: Diagnosis not present

## 2016-03-31 DIAGNOSIS — I96 Gangrene, not elsewhere classified: Secondary | ICD-10-CM | POA: Diagnosis present

## 2016-04-02 DIAGNOSIS — I2510 Atherosclerotic heart disease of native coronary artery without angina pectoris: Secondary | ICD-10-CM | POA: Diagnosis not present

## 2016-04-02 DIAGNOSIS — I10 Essential (primary) hypertension: Secondary | ICD-10-CM | POA: Diagnosis not present

## 2016-04-02 DIAGNOSIS — Z89511 Acquired absence of right leg below knee: Secondary | ICD-10-CM | POA: Diagnosis not present

## 2016-04-02 DIAGNOSIS — I7389 Other specified peripheral vascular diseases: Secondary | ICD-10-CM | POA: Diagnosis not present

## 2016-04-02 DIAGNOSIS — G9349 Other encephalopathy: Secondary | ICD-10-CM | POA: Diagnosis not present

## 2016-04-02 DIAGNOSIS — E119 Type 2 diabetes mellitus without complications: Secondary | ICD-10-CM | POA: Diagnosis not present

## 2016-04-02 DIAGNOSIS — M6281 Muscle weakness (generalized): Secondary | ICD-10-CM | POA: Diagnosis not present

## 2016-04-02 DIAGNOSIS — I484 Atypical atrial flutter: Secondary | ICD-10-CM | POA: Diagnosis not present

## 2016-04-05 DIAGNOSIS — F4325 Adjustment disorder with mixed disturbance of emotions and conduct: Secondary | ICD-10-CM | POA: Diagnosis not present

## 2016-04-05 DIAGNOSIS — F331 Major depressive disorder, recurrent, moderate: Secondary | ICD-10-CM | POA: Diagnosis not present

## 2016-04-05 DIAGNOSIS — F064 Anxiety disorder due to known physiological condition: Secondary | ICD-10-CM | POA: Diagnosis not present

## 2016-04-05 DIAGNOSIS — F0630 Mood disorder due to known physiological condition, unspecified: Secondary | ICD-10-CM | POA: Diagnosis not present

## 2016-04-06 ENCOUNTER — Ambulatory Visit: Payer: 59 | Admitting: Neurology

## 2016-04-06 DIAGNOSIS — G9349 Other encephalopathy: Secondary | ICD-10-CM | POA: Diagnosis not present

## 2016-04-06 DIAGNOSIS — Z89511 Acquired absence of right leg below knee: Secondary | ICD-10-CM | POA: Diagnosis not present

## 2016-04-06 DIAGNOSIS — I10 Essential (primary) hypertension: Secondary | ICD-10-CM | POA: Diagnosis not present

## 2016-04-06 DIAGNOSIS — I2510 Atherosclerotic heart disease of native coronary artery without angina pectoris: Secondary | ICD-10-CM | POA: Diagnosis not present

## 2016-04-06 DIAGNOSIS — E119 Type 2 diabetes mellitus without complications: Secondary | ICD-10-CM | POA: Diagnosis not present

## 2016-04-06 DIAGNOSIS — I7389 Other specified peripheral vascular diseases: Secondary | ICD-10-CM | POA: Diagnosis not present

## 2016-04-06 DIAGNOSIS — M6281 Muscle weakness (generalized): Secondary | ICD-10-CM | POA: Diagnosis not present

## 2016-04-06 DIAGNOSIS — I484 Atypical atrial flutter: Secondary | ICD-10-CM | POA: Diagnosis not present

## 2016-04-07 IMAGING — CT CT L SPINE W/O CM
4 of 10 series · 12 of 33 positions shown, 14 images · non-contrast
Comparison: None.

CLINICAL DATA: Intermittent claudication.  Right leg pain.

EXAM:
CT LUMBAR SPINE WITHOUT CONTRAST
TECHNIQUE: Multidetector CT imaging of the lumbar spine was performed without
intravenous contrast administration. Multiplanar CT image
reconstructions were also generated.

[Series 4: l spine bone · axial · 0.31mm/px · z∈[-21,+61]mm · 2 of 101 slices shown, 3 images]
[im 34/101  soft-tissue]
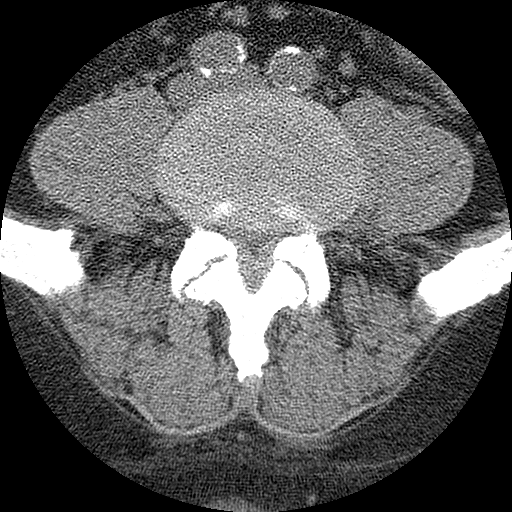
[im 34/101  bone]
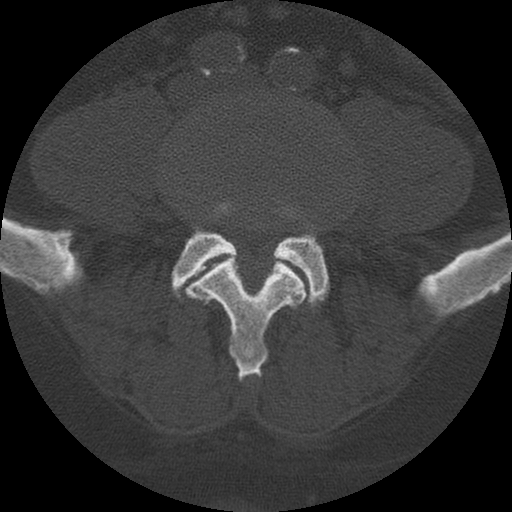
[im 67/101  bone]
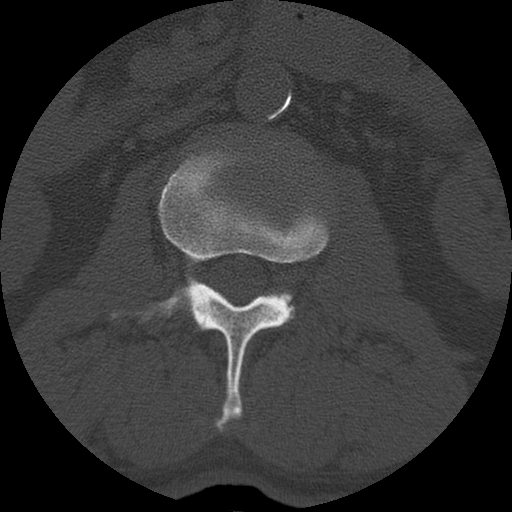

[Series 5: l spine detail · axial · 0.31mm/px · z∈[-21,+61]mm · 2 of 101 slices shown]
[im 34/101  bone]
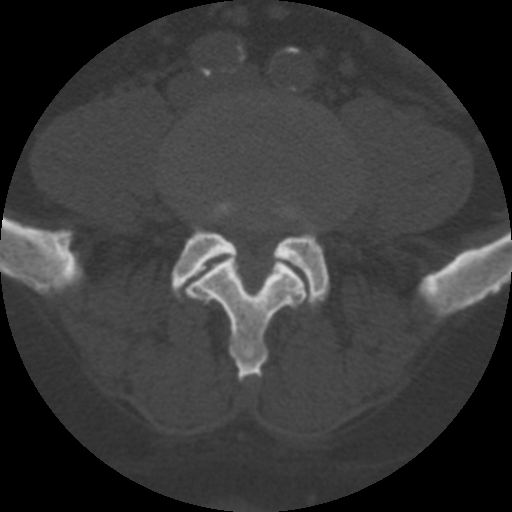
[im 67/101  bone]
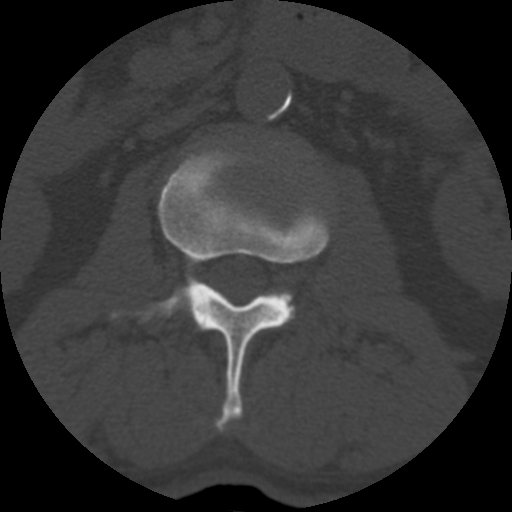

[Series 200: coronal · coronal · 0.50mm/px · 3 of 57 slices shown]
[im 12/57  bone]
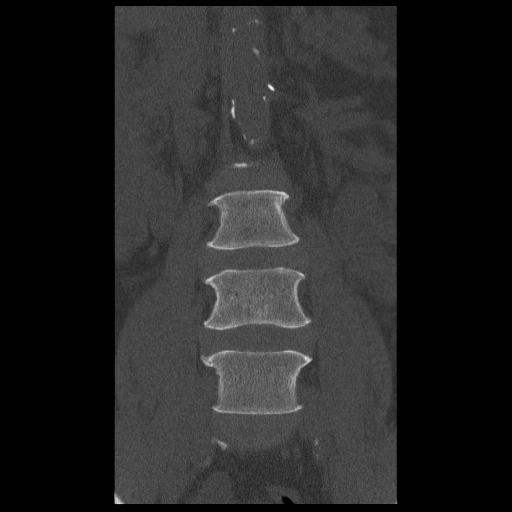
[im 23/57  bone]
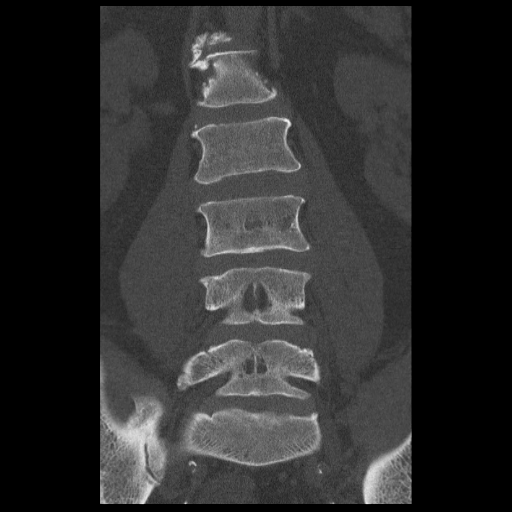
[im 34/57  bone]
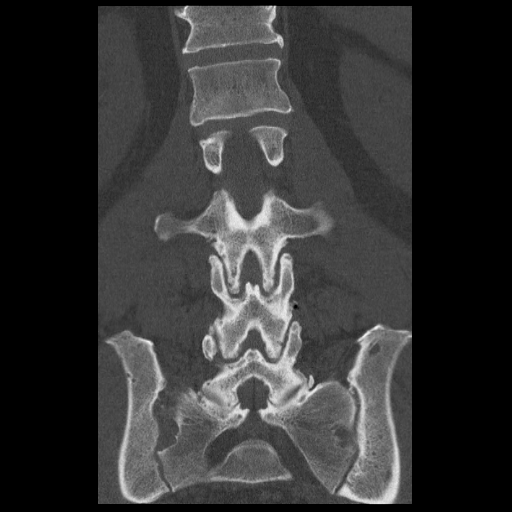

[Series 201: sagittal · sagittal · 0.50mm/px · 5 of 57 slices shown, 6 images]
[im 19/57  bone]
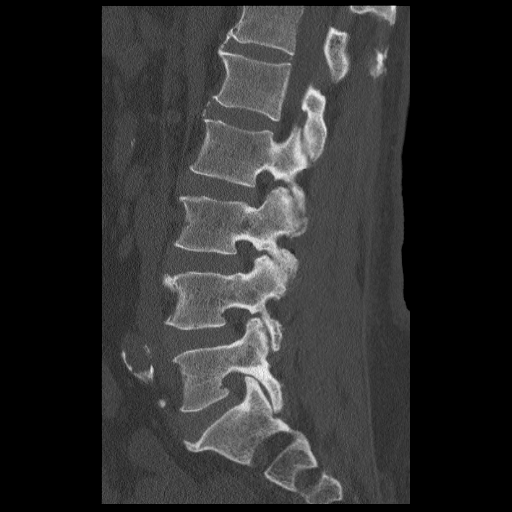
[im 24/57  bone]
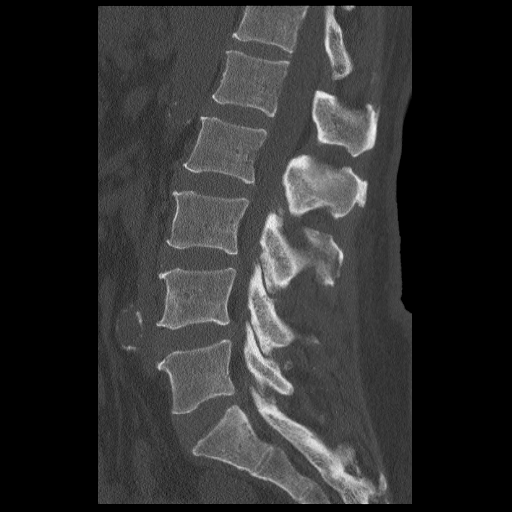
[im 29/57  soft-tissue]
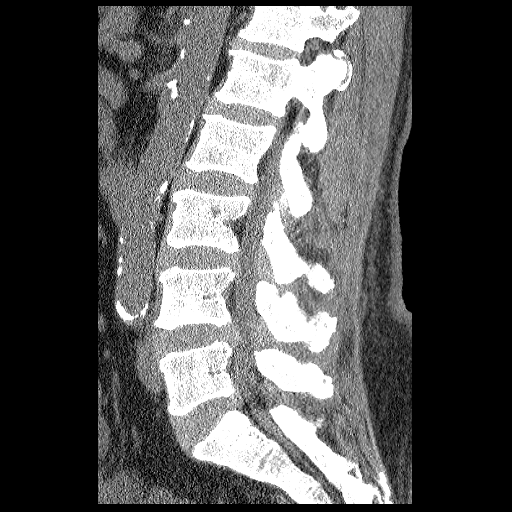
[im 29/57  bone]
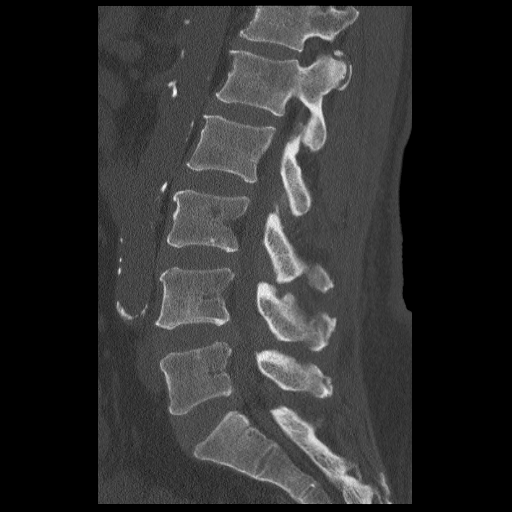
[im 33/57  bone]
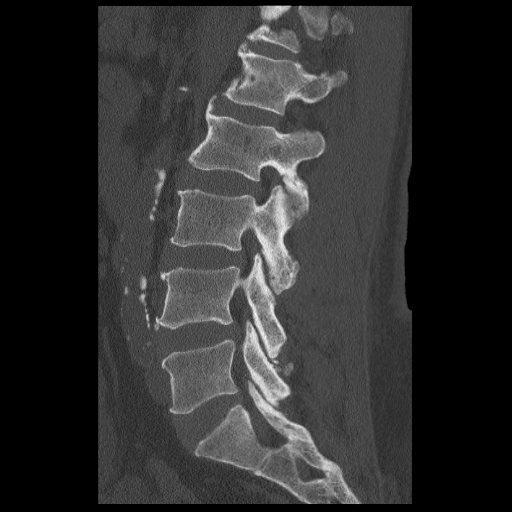
[im 38/57  bone]
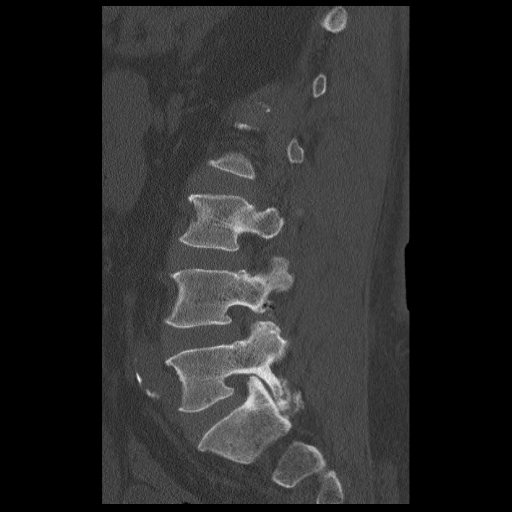

[12 of 33 positions shown; findings below may reference images not displayed]

FINDINGS: No fracture or spondylolisthesis is noted. Atherosclerotic
calcifications of abdominal aorta are noted without aneurysm
formation. Mild hypertrophy of posterior facet joints is noted
suggesting degenerative change. Mild anterior osteophyte formation
is noted at T12-L1, L1-2, L3-4 and L4-5. Disc spaces appear to be
well maintained.

T12-L1:  No significant spinal canal stenosis is noted.

L1-2:  No significant spinal canal stenosis is noted.

L2-3:  No significant spinal canal stenosis is noted.

L3-4: Mild to moderate broad-based posterior disc bulging is noted
which in combination with hypertrophy of the ligamentum flavum
posteriorly results in mild central spinal canal stenosis.

L4-5: Moderate broad-based posterior disc bulging is noted which in
combination with hypertrophy of the ligamentum flavum posteriorly
results in moderate central spinal canal stenosis.

L5-S1: Mild broad-based posterior disc bulging is noted which in
combination with hypertrophy of ligamentum flavum posteriorly
results in mild central spinal canal stenosis.

5 mm rounded lucency is noted anteriorly in the L1 vertebral body.
Similar but smaller abnormality is seen on the left side of the L2
vertebral body. Ill-defined lucency measuring 7 mm is noted in the
L3 vertebral body.
IMPRESSION: Mild degenerative changes are noted as described above.

Mild central spinal canal stenosis is noted at L3-4 secondary to
broad-based posterior disc bulging and hypertrophy of ligamentum
flavum.

Moderate central spinal canal stenosis is noted at L4-5 secondary to
broad-based posterior disc bulging and hypertrophy of ligamentum
flavum posteriorly.

Mild central spinal canal stenosis is noted at L5-S1 secondary to
broad-based posterior disc bulging and hypertrophy of ligamentum
flavum.

Several rounded lucencies are noted in the L1, L2 and L3 vertebral
bodies, with the largest measuring 7 mm in L3 vertebral body. These
may simply represent hemangiomas, but if the patient has a history
of primary malignancy, metastatic disease cannot be excluded.

## 2016-04-09 DIAGNOSIS — D72829 Elevated white blood cell count, unspecified: Secondary | ICD-10-CM | POA: Diagnosis not present

## 2016-04-09 DIAGNOSIS — Y838 Other surgical procedures as the cause of abnormal reaction of the patient, or of later complication, without mention of misadventure at the time of the procedure: Secondary | ICD-10-CM | POA: Diagnosis not present

## 2016-04-09 DIAGNOSIS — R489 Unspecified symbolic dysfunctions: Secondary | ICD-10-CM | POA: Diagnosis not present

## 2016-04-09 DIAGNOSIS — D473 Essential (hemorrhagic) thrombocythemia: Secondary | ICD-10-CM | POA: Diagnosis present

## 2016-04-09 DIAGNOSIS — Z951 Presence of aortocoronary bypass graft: Secondary | ICD-10-CM | POA: Diagnosis not present

## 2016-04-09 DIAGNOSIS — Z89611 Acquired absence of right leg above knee: Secondary | ICD-10-CM | POA: Diagnosis not present

## 2016-04-09 DIAGNOSIS — M6281 Muscle weakness (generalized): Secondary | ICD-10-CM | POA: Diagnosis not present

## 2016-04-09 DIAGNOSIS — Z6827 Body mass index (BMI) 27.0-27.9, adult: Secondary | ICD-10-CM | POA: Diagnosis not present

## 2016-04-09 DIAGNOSIS — Z87891 Personal history of nicotine dependence: Secondary | ICD-10-CM | POA: Diagnosis not present

## 2016-04-09 DIAGNOSIS — E663 Overweight: Secondary | ICD-10-CM | POA: Diagnosis present

## 2016-04-09 DIAGNOSIS — Z792 Long term (current) use of antibiotics: Secondary | ICD-10-CM | POA: Diagnosis not present

## 2016-04-09 DIAGNOSIS — I7389 Other specified peripheral vascular diseases: Secondary | ICD-10-CM | POA: Diagnosis not present

## 2016-04-09 DIAGNOSIS — Z955 Presence of coronary angioplasty implant and graft: Secondary | ICD-10-CM | POA: Diagnosis not present

## 2016-04-09 DIAGNOSIS — R569 Unspecified convulsions: Secondary | ICD-10-CM | POA: Diagnosis present

## 2016-04-09 DIAGNOSIS — Z7901 Long term (current) use of anticoagulants: Secondary | ICD-10-CM | POA: Diagnosis not present

## 2016-04-09 DIAGNOSIS — E871 Hypo-osmolality and hyponatremia: Secondary | ICD-10-CM | POA: Diagnosis not present

## 2016-04-09 DIAGNOSIS — R2689 Other abnormalities of gait and mobility: Secondary | ICD-10-CM | POA: Diagnosis not present

## 2016-04-09 DIAGNOSIS — T8743 Infection of amputation stump, right lower extremity: Secondary | ICD-10-CM | POA: Diagnosis not present

## 2016-04-09 DIAGNOSIS — E119 Type 2 diabetes mellitus without complications: Secondary | ICD-10-CM | POA: Diagnosis not present

## 2016-04-09 DIAGNOSIS — Z743 Need for continuous supervision: Secondary | ICD-10-CM | POA: Diagnosis not present

## 2016-04-09 DIAGNOSIS — E1165 Type 2 diabetes mellitus with hyperglycemia: Secondary | ICD-10-CM | POA: Diagnosis present

## 2016-04-09 DIAGNOSIS — Z01818 Encounter for other preprocedural examination: Secondary | ICD-10-CM | POA: Diagnosis not present

## 2016-04-09 DIAGNOSIS — G4733 Obstructive sleep apnea (adult) (pediatric): Secondary | ICD-10-CM | POA: Diagnosis present

## 2016-04-09 DIAGNOSIS — I4891 Unspecified atrial fibrillation: Secondary | ICD-10-CM | POA: Diagnosis not present

## 2016-04-09 DIAGNOSIS — Z8673 Personal history of transient ischemic attack (TIA), and cerebral infarction without residual deficits: Secondary | ICD-10-CM | POA: Diagnosis not present

## 2016-04-09 DIAGNOSIS — R000 Tachycardia, unspecified: Secondary | ICD-10-CM | POA: Diagnosis not present

## 2016-04-09 DIAGNOSIS — I452 Bifascicular block: Secondary | ICD-10-CM | POA: Diagnosis not present

## 2016-04-09 DIAGNOSIS — R262 Difficulty in walking, not elsewhere classified: Secondary | ICD-10-CM | POA: Diagnosis not present

## 2016-04-09 DIAGNOSIS — R05 Cough: Secondary | ICD-10-CM | POA: Diagnosis not present

## 2016-04-09 DIAGNOSIS — F0390 Unspecified dementia without behavioral disturbance: Secondary | ICD-10-CM | POA: Diagnosis present

## 2016-04-09 DIAGNOSIS — A4159 Other Gram-negative sepsis: Secondary | ICD-10-CM | POA: Diagnosis not present

## 2016-04-09 DIAGNOSIS — D509 Iron deficiency anemia, unspecified: Secondary | ICD-10-CM | POA: Diagnosis not present

## 2016-04-09 DIAGNOSIS — I10 Essential (primary) hypertension: Secondary | ICD-10-CM | POA: Diagnosis not present

## 2016-04-09 DIAGNOSIS — D471 Chronic myeloproliferative disease: Secondary | ICD-10-CM | POA: Diagnosis not present

## 2016-04-09 DIAGNOSIS — Z89511 Acquired absence of right leg below knee: Secondary | ICD-10-CM | POA: Diagnosis not present

## 2016-04-09 DIAGNOSIS — B961 Klebsiella pneumoniae [K. pneumoniae] as the cause of diseases classified elsewhere: Secondary | ICD-10-CM | POA: Diagnosis not present

## 2016-04-09 DIAGNOSIS — R58 Hemorrhage, not elsewhere classified: Secondary | ICD-10-CM | POA: Diagnosis not present

## 2016-04-09 DIAGNOSIS — R52 Pain, unspecified: Secondary | ICD-10-CM | POA: Diagnosis not present

## 2016-04-09 DIAGNOSIS — T8740 Infection of amputation stump, unspecified extremity: Secondary | ICD-10-CM | POA: Diagnosis not present

## 2016-04-09 DIAGNOSIS — T814XXA Infection following a procedure, initial encounter: Secondary | ICD-10-CM | POA: Diagnosis not present

## 2016-04-09 DIAGNOSIS — E785 Hyperlipidemia, unspecified: Secondary | ICD-10-CM | POA: Diagnosis not present

## 2016-04-09 DIAGNOSIS — D45 Polycythemia vera: Secondary | ICD-10-CM | POA: Diagnosis not present

## 2016-04-09 DIAGNOSIS — Z4781 Encounter for orthopedic aftercare following surgical amputation: Secondary | ICD-10-CM | POA: Diagnosis not present

## 2016-04-09 DIAGNOSIS — I96 Gangrene, not elsewhere classified: Secondary | ICD-10-CM | POA: Diagnosis not present

## 2016-04-09 DIAGNOSIS — G9349 Other encephalopathy: Secondary | ICD-10-CM | POA: Diagnosis not present

## 2016-04-09 DIAGNOSIS — I2510 Atherosclerotic heart disease of native coronary artery without angina pectoris: Secondary | ICD-10-CM | POA: Diagnosis not present

## 2016-04-09 DIAGNOSIS — B9620 Unspecified Escherichia coli [E. coli] as the cause of diseases classified elsewhere: Secondary | ICD-10-CM | POA: Diagnosis not present

## 2016-04-09 DIAGNOSIS — G4089 Other seizures: Secondary | ICD-10-CM | POA: Diagnosis not present

## 2016-04-09 DIAGNOSIS — Z7982 Long term (current) use of aspirin: Secondary | ICD-10-CM | POA: Diagnosis not present

## 2016-04-09 DIAGNOSIS — G990 Autonomic neuropathy in diseases classified elsewhere: Secondary | ICD-10-CM | POA: Diagnosis not present

## 2016-04-09 DIAGNOSIS — Z794 Long term (current) use of insulin: Secondary | ICD-10-CM | POA: Diagnosis not present

## 2016-04-09 DIAGNOSIS — R41841 Cognitive communication deficit: Secondary | ICD-10-CM | POA: Diagnosis not present

## 2016-04-09 DIAGNOSIS — I484 Atypical atrial flutter: Secondary | ICD-10-CM | POA: Diagnosis not present

## 2016-04-09 DIAGNOSIS — I70203 Unspecified atherosclerosis of native arteries of extremities, bilateral legs: Secondary | ICD-10-CM | POA: Diagnosis present

## 2016-04-09 DIAGNOSIS — D649 Anemia, unspecified: Secondary | ICD-10-CM | POA: Diagnosis present

## 2016-04-09 DIAGNOSIS — K6811 Postprocedural retroperitoneal abscess: Secondary | ICD-10-CM | POA: Diagnosis not present

## 2016-04-09 DIAGNOSIS — R7881 Bacteremia: Secondary | ICD-10-CM | POA: Diagnosis not present

## 2016-04-09 DIAGNOSIS — F3289 Other specified depressive episodes: Secondary | ICD-10-CM | POA: Diagnosis not present

## 2016-04-17 DIAGNOSIS — I2510 Atherosclerotic heart disease of native coronary artery without angina pectoris: Secondary | ICD-10-CM | POA: Diagnosis not present

## 2016-04-17 DIAGNOSIS — R7881 Bacteremia: Secondary | ICD-10-CM | POA: Diagnosis not present

## 2016-04-17 DIAGNOSIS — D45 Polycythemia vera: Secondary | ICD-10-CM | POA: Diagnosis not present

## 2016-04-17 DIAGNOSIS — F3289 Other specified depressive episodes: Secondary | ICD-10-CM | POA: Diagnosis not present

## 2016-04-17 DIAGNOSIS — G4089 Other seizures: Secondary | ICD-10-CM | POA: Diagnosis not present

## 2016-04-17 DIAGNOSIS — D72829 Elevated white blood cell count, unspecified: Secondary | ICD-10-CM | POA: Diagnosis not present

## 2016-04-17 DIAGNOSIS — R2689 Other abnormalities of gait and mobility: Secondary | ICD-10-CM | POA: Diagnosis not present

## 2016-04-17 DIAGNOSIS — G990 Autonomic neuropathy in diseases classified elsewhere: Secondary | ICD-10-CM | POA: Diagnosis not present

## 2016-04-17 DIAGNOSIS — Z794 Long term (current) use of insulin: Secondary | ICD-10-CM | POA: Diagnosis not present

## 2016-04-17 DIAGNOSIS — Z89611 Acquired absence of right leg above knee: Secondary | ICD-10-CM | POA: Diagnosis not present

## 2016-04-17 DIAGNOSIS — R52 Pain, unspecified: Secondary | ICD-10-CM | POA: Diagnosis not present

## 2016-04-17 DIAGNOSIS — K6811 Postprocedural retroperitoneal abscess: Secondary | ICD-10-CM | POA: Diagnosis not present

## 2016-04-17 DIAGNOSIS — R262 Difficulty in walking, not elsewhere classified: Secondary | ICD-10-CM | POA: Diagnosis not present

## 2016-04-17 DIAGNOSIS — R489 Unspecified symbolic dysfunctions: Secondary | ICD-10-CM | POA: Diagnosis not present

## 2016-04-17 DIAGNOSIS — D509 Iron deficiency anemia, unspecified: Secondary | ICD-10-CM | POA: Diagnosis not present

## 2016-04-17 DIAGNOSIS — M6281 Muscle weakness (generalized): Secondary | ICD-10-CM | POA: Diagnosis not present

## 2016-04-17 DIAGNOSIS — I10 Essential (primary) hypertension: Secondary | ICD-10-CM | POA: Diagnosis not present

## 2016-04-17 DIAGNOSIS — E119 Type 2 diabetes mellitus without complications: Secondary | ICD-10-CM | POA: Diagnosis not present

## 2016-04-17 DIAGNOSIS — R41841 Cognitive communication deficit: Secondary | ICD-10-CM | POA: Diagnosis not present

## 2016-04-17 DIAGNOSIS — Z4781 Encounter for orthopedic aftercare following surgical amputation: Secondary | ICD-10-CM | POA: Diagnosis not present

## 2016-04-17 DIAGNOSIS — B961 Klebsiella pneumoniae [K. pneumoniae] as the cause of diseases classified elsewhere: Secondary | ICD-10-CM | POA: Diagnosis not present

## 2016-04-17 DIAGNOSIS — T8740 Infection of amputation stump, unspecified extremity: Secondary | ICD-10-CM | POA: Diagnosis not present

## 2016-04-17 DIAGNOSIS — B9620 Unspecified Escherichia coli [E. coli] as the cause of diseases classified elsewhere: Secondary | ICD-10-CM | POA: Diagnosis not present

## 2016-04-17 DIAGNOSIS — Z743 Need for continuous supervision: Secondary | ICD-10-CM | POA: Diagnosis not present

## 2016-04-17 DIAGNOSIS — E785 Hyperlipidemia, unspecified: Secondary | ICD-10-CM | POA: Diagnosis not present

## 2016-04-18 DIAGNOSIS — R7881 Bacteremia: Secondary | ICD-10-CM | POA: Diagnosis not present

## 2016-04-18 DIAGNOSIS — Z89611 Acquired absence of right leg above knee: Secondary | ICD-10-CM | POA: Diagnosis not present

## 2016-04-18 DIAGNOSIS — Z794 Long term (current) use of insulin: Secondary | ICD-10-CM | POA: Diagnosis not present

## 2016-04-18 DIAGNOSIS — E119 Type 2 diabetes mellitus without complications: Secondary | ICD-10-CM | POA: Diagnosis not present

## 2016-04-20 DIAGNOSIS — Z89611 Acquired absence of right leg above knee: Secondary | ICD-10-CM | POA: Diagnosis not present

## 2016-04-20 DIAGNOSIS — R7881 Bacteremia: Secondary | ICD-10-CM | POA: Diagnosis not present

## 2016-04-20 DIAGNOSIS — E119 Type 2 diabetes mellitus without complications: Secondary | ICD-10-CM | POA: Diagnosis not present

## 2016-04-20 DIAGNOSIS — I10 Essential (primary) hypertension: Secondary | ICD-10-CM | POA: Diagnosis not present

## 2016-04-22 DIAGNOSIS — Z89611 Acquired absence of right leg above knee: Secondary | ICD-10-CM | POA: Diagnosis not present

## 2016-04-22 DIAGNOSIS — E119 Type 2 diabetes mellitus without complications: Secondary | ICD-10-CM | POA: Diagnosis not present

## 2016-04-22 DIAGNOSIS — I10 Essential (primary) hypertension: Secondary | ICD-10-CM | POA: Diagnosis not present

## 2016-04-22 DIAGNOSIS — R7881 Bacteremia: Secondary | ICD-10-CM | POA: Diagnosis not present

## 2016-04-23 DIAGNOSIS — E119 Type 2 diabetes mellitus without complications: Secondary | ICD-10-CM | POA: Diagnosis not present

## 2016-04-23 DIAGNOSIS — I10 Essential (primary) hypertension: Secondary | ICD-10-CM | POA: Diagnosis not present

## 2016-04-23 DIAGNOSIS — Z89611 Acquired absence of right leg above knee: Secondary | ICD-10-CM | POA: Diagnosis not present

## 2016-04-23 DIAGNOSIS — D45 Polycythemia vera: Secondary | ICD-10-CM | POA: Diagnosis not present

## 2016-04-27 DIAGNOSIS — E119 Type 2 diabetes mellitus without complications: Secondary | ICD-10-CM | POA: Diagnosis not present

## 2016-04-27 DIAGNOSIS — Z89611 Acquired absence of right leg above knee: Secondary | ICD-10-CM | POA: Diagnosis not present

## 2016-04-27 DIAGNOSIS — I2510 Atherosclerotic heart disease of native coronary artery without angina pectoris: Secondary | ICD-10-CM | POA: Diagnosis not present

## 2016-04-27 DIAGNOSIS — R7881 Bacteremia: Secondary | ICD-10-CM | POA: Diagnosis not present

## 2016-05-04 DIAGNOSIS — T8740 Infection of amputation stump, unspecified extremity: Secondary | ICD-10-CM | POA: Diagnosis not present

## 2016-05-04 DIAGNOSIS — Z89611 Acquired absence of right leg above knee: Secondary | ICD-10-CM | POA: Diagnosis not present

## 2016-05-04 DIAGNOSIS — E119 Type 2 diabetes mellitus without complications: Secondary | ICD-10-CM | POA: Diagnosis not present

## 2016-05-09 DIAGNOSIS — E119 Type 2 diabetes mellitus without complications: Secondary | ICD-10-CM | POA: Diagnosis not present

## 2016-05-09 DIAGNOSIS — I10 Essential (primary) hypertension: Secondary | ICD-10-CM | POA: Diagnosis not present

## 2016-05-09 DIAGNOSIS — Z89611 Acquired absence of right leg above knee: Secondary | ICD-10-CM | POA: Diagnosis not present

## 2016-05-09 DIAGNOSIS — T8740 Infection of amputation stump, unspecified extremity: Secondary | ICD-10-CM | POA: Diagnosis not present

## 2016-05-10 ENCOUNTER — Ambulatory Visit: Payer: Medicare Other | Admitting: Dietician

## 2016-05-14 DIAGNOSIS — I2510 Atherosclerotic heart disease of native coronary artery without angina pectoris: Secondary | ICD-10-CM | POA: Diagnosis not present

## 2016-05-14 DIAGNOSIS — T8740 Infection of amputation stump, unspecified extremity: Secondary | ICD-10-CM | POA: Diagnosis not present

## 2016-05-14 DIAGNOSIS — Z89611 Acquired absence of right leg above knee: Secondary | ICD-10-CM | POA: Diagnosis not present

## 2016-05-14 DIAGNOSIS — E119 Type 2 diabetes mellitus without complications: Secondary | ICD-10-CM | POA: Diagnosis not present

## 2016-05-16 DIAGNOSIS — T8740 Infection of amputation stump, unspecified extremity: Secondary | ICD-10-CM | POA: Diagnosis not present

## 2016-05-16 DIAGNOSIS — D45 Polycythemia vera: Secondary | ICD-10-CM | POA: Diagnosis not present

## 2016-05-16 DIAGNOSIS — Z89611 Acquired absence of right leg above knee: Secondary | ICD-10-CM | POA: Diagnosis not present

## 2016-05-16 DIAGNOSIS — R7881 Bacteremia: Secondary | ICD-10-CM | POA: Diagnosis not present

## 2016-05-21 DIAGNOSIS — Z8679 Personal history of other diseases of the circulatory system: Secondary | ICD-10-CM | POA: Diagnosis not present

## 2016-05-21 DIAGNOSIS — I25708 Atherosclerosis of coronary artery bypass graft(s), unspecified, with other forms of angina pectoris: Secondary | ICD-10-CM | POA: Diagnosis not present

## 2016-05-21 DIAGNOSIS — Z89611 Acquired absence of right leg above knee: Secondary | ICD-10-CM | POA: Diagnosis not present

## 2016-05-21 DIAGNOSIS — I779 Disorder of arteries and arterioles, unspecified: Secondary | ICD-10-CM | POA: Diagnosis not present

## 2016-05-22 DIAGNOSIS — Z7984 Long term (current) use of oral hypoglycemic drugs: Secondary | ICD-10-CM | POA: Diagnosis not present

## 2016-05-22 DIAGNOSIS — Z7982 Long term (current) use of aspirin: Secondary | ICD-10-CM | POA: Diagnosis not present

## 2016-05-22 DIAGNOSIS — E1151 Type 2 diabetes mellitus with diabetic peripheral angiopathy without gangrene: Secondary | ICD-10-CM | POA: Diagnosis not present

## 2016-05-22 DIAGNOSIS — I2510 Atherosclerotic heart disease of native coronary artery without angina pectoris: Secondary | ICD-10-CM | POA: Diagnosis not present

## 2016-05-22 DIAGNOSIS — E7800 Pure hypercholesterolemia, unspecified: Secondary | ICD-10-CM | POA: Diagnosis not present

## 2016-05-22 DIAGNOSIS — Z7902 Long term (current) use of antithrombotics/antiplatelets: Secondary | ICD-10-CM | POA: Diagnosis not present

## 2016-05-22 DIAGNOSIS — I10 Essential (primary) hypertension: Secondary | ICD-10-CM | POA: Diagnosis not present

## 2016-05-22 DIAGNOSIS — D509 Iron deficiency anemia, unspecified: Secondary | ICD-10-CM | POA: Diagnosis not present

## 2016-05-22 DIAGNOSIS — D45 Polycythemia vera: Secondary | ICD-10-CM | POA: Diagnosis not present

## 2016-05-22 DIAGNOSIS — Z4781 Encounter for orthopedic aftercare following surgical amputation: Secondary | ICD-10-CM | POA: Diagnosis not present

## 2016-05-22 DIAGNOSIS — Z89611 Acquired absence of right leg above knee: Secondary | ICD-10-CM | POA: Diagnosis not present

## 2016-05-30 DIAGNOSIS — I10 Essential (primary) hypertension: Secondary | ICD-10-CM | POA: Diagnosis not present

## 2016-05-30 DIAGNOSIS — E1151 Type 2 diabetes mellitus with diabetic peripheral angiopathy without gangrene: Secondary | ICD-10-CM | POA: Diagnosis not present

## 2016-05-30 DIAGNOSIS — E7800 Pure hypercholesterolemia, unspecified: Secondary | ICD-10-CM | POA: Diagnosis not present

## 2016-05-30 DIAGNOSIS — D45 Polycythemia vera: Secondary | ICD-10-CM | POA: Diagnosis not present

## 2016-05-30 DIAGNOSIS — D509 Iron deficiency anemia, unspecified: Secondary | ICD-10-CM | POA: Diagnosis not present

## 2016-05-30 DIAGNOSIS — Z4781 Encounter for orthopedic aftercare following surgical amputation: Secondary | ICD-10-CM | POA: Diagnosis not present

## 2016-06-01 DIAGNOSIS — Z4781 Encounter for orthopedic aftercare following surgical amputation: Secondary | ICD-10-CM | POA: Diagnosis not present

## 2016-06-01 DIAGNOSIS — I10 Essential (primary) hypertension: Secondary | ICD-10-CM | POA: Diagnosis not present

## 2016-06-01 DIAGNOSIS — D45 Polycythemia vera: Secondary | ICD-10-CM | POA: Diagnosis not present

## 2016-06-01 DIAGNOSIS — E7800 Pure hypercholesterolemia, unspecified: Secondary | ICD-10-CM | POA: Diagnosis not present

## 2016-06-01 DIAGNOSIS — D509 Iron deficiency anemia, unspecified: Secondary | ICD-10-CM | POA: Diagnosis not present

## 2016-06-01 DIAGNOSIS — E1151 Type 2 diabetes mellitus with diabetic peripheral angiopathy without gangrene: Secondary | ICD-10-CM | POA: Diagnosis not present

## 2016-06-05 DIAGNOSIS — E7800 Pure hypercholesterolemia, unspecified: Secondary | ICD-10-CM | POA: Diagnosis not present

## 2016-06-05 DIAGNOSIS — E1151 Type 2 diabetes mellitus with diabetic peripheral angiopathy without gangrene: Secondary | ICD-10-CM | POA: Diagnosis not present

## 2016-06-05 DIAGNOSIS — Z4781 Encounter for orthopedic aftercare following surgical amputation: Secondary | ICD-10-CM | POA: Diagnosis not present

## 2016-06-05 DIAGNOSIS — D45 Polycythemia vera: Secondary | ICD-10-CM | POA: Diagnosis not present

## 2016-06-05 DIAGNOSIS — D509 Iron deficiency anemia, unspecified: Secondary | ICD-10-CM | POA: Diagnosis not present

## 2016-06-05 DIAGNOSIS — I10 Essential (primary) hypertension: Secondary | ICD-10-CM | POA: Diagnosis not present

## 2016-06-06 DIAGNOSIS — Z4781 Encounter for orthopedic aftercare following surgical amputation: Secondary | ICD-10-CM | POA: Diagnosis not present

## 2016-06-06 DIAGNOSIS — E1151 Type 2 diabetes mellitus with diabetic peripheral angiopathy without gangrene: Secondary | ICD-10-CM | POA: Diagnosis not present

## 2016-06-06 DIAGNOSIS — D45 Polycythemia vera: Secondary | ICD-10-CM | POA: Diagnosis not present

## 2016-06-06 DIAGNOSIS — D509 Iron deficiency anemia, unspecified: Secondary | ICD-10-CM | POA: Diagnosis not present

## 2016-06-06 DIAGNOSIS — E7800 Pure hypercholesterolemia, unspecified: Secondary | ICD-10-CM | POA: Diagnosis not present

## 2016-06-06 DIAGNOSIS — I10 Essential (primary) hypertension: Secondary | ICD-10-CM | POA: Diagnosis not present

## 2016-06-07 DIAGNOSIS — I10 Essential (primary) hypertension: Secondary | ICD-10-CM | POA: Diagnosis not present

## 2016-06-07 DIAGNOSIS — E1151 Type 2 diabetes mellitus with diabetic peripheral angiopathy without gangrene: Secondary | ICD-10-CM | POA: Diagnosis not present

## 2016-06-07 DIAGNOSIS — D45 Polycythemia vera: Secondary | ICD-10-CM | POA: Diagnosis not present

## 2016-06-07 DIAGNOSIS — D509 Iron deficiency anemia, unspecified: Secondary | ICD-10-CM | POA: Diagnosis not present

## 2016-06-07 DIAGNOSIS — E7800 Pure hypercholesterolemia, unspecified: Secondary | ICD-10-CM | POA: Diagnosis not present

## 2016-06-07 DIAGNOSIS — Z4781 Encounter for orthopedic aftercare following surgical amputation: Secondary | ICD-10-CM | POA: Diagnosis not present

## 2016-06-08 DIAGNOSIS — D45 Polycythemia vera: Secondary | ICD-10-CM | POA: Diagnosis not present

## 2016-06-08 DIAGNOSIS — D509 Iron deficiency anemia, unspecified: Secondary | ICD-10-CM | POA: Diagnosis not present

## 2016-06-08 DIAGNOSIS — E1151 Type 2 diabetes mellitus with diabetic peripheral angiopathy without gangrene: Secondary | ICD-10-CM | POA: Diagnosis not present

## 2016-06-08 DIAGNOSIS — E7800 Pure hypercholesterolemia, unspecified: Secondary | ICD-10-CM | POA: Diagnosis not present

## 2016-06-08 DIAGNOSIS — I10 Essential (primary) hypertension: Secondary | ICD-10-CM | POA: Diagnosis not present

## 2016-06-08 DIAGNOSIS — Z4781 Encounter for orthopedic aftercare following surgical amputation: Secondary | ICD-10-CM | POA: Diagnosis not present

## 2016-06-12 DIAGNOSIS — E1151 Type 2 diabetes mellitus with diabetic peripheral angiopathy without gangrene: Secondary | ICD-10-CM | POA: Diagnosis not present

## 2016-06-12 DIAGNOSIS — D45 Polycythemia vera: Secondary | ICD-10-CM | POA: Diagnosis not present

## 2016-06-12 DIAGNOSIS — I10 Essential (primary) hypertension: Secondary | ICD-10-CM | POA: Diagnosis not present

## 2016-06-12 DIAGNOSIS — Z4781 Encounter for orthopedic aftercare following surgical amputation: Secondary | ICD-10-CM | POA: Diagnosis not present

## 2016-06-12 DIAGNOSIS — D509 Iron deficiency anemia, unspecified: Secondary | ICD-10-CM | POA: Diagnosis not present

## 2016-06-12 DIAGNOSIS — E7800 Pure hypercholesterolemia, unspecified: Secondary | ICD-10-CM | POA: Diagnosis not present

## 2016-06-13 DIAGNOSIS — E1151 Type 2 diabetes mellitus with diabetic peripheral angiopathy without gangrene: Secondary | ICD-10-CM | POA: Diagnosis not present

## 2016-06-13 DIAGNOSIS — I10 Essential (primary) hypertension: Secondary | ICD-10-CM | POA: Diagnosis not present

## 2016-06-13 DIAGNOSIS — E7800 Pure hypercholesterolemia, unspecified: Secondary | ICD-10-CM | POA: Diagnosis not present

## 2016-06-13 DIAGNOSIS — Z4781 Encounter for orthopedic aftercare following surgical amputation: Secondary | ICD-10-CM | POA: Diagnosis not present

## 2016-06-13 DIAGNOSIS — D45 Polycythemia vera: Secondary | ICD-10-CM | POA: Diagnosis not present

## 2016-06-13 DIAGNOSIS — D509 Iron deficiency anemia, unspecified: Secondary | ICD-10-CM | POA: Diagnosis not present

## 2016-06-15 DIAGNOSIS — E1151 Type 2 diabetes mellitus with diabetic peripheral angiopathy without gangrene: Secondary | ICD-10-CM | POA: Diagnosis not present

## 2016-06-15 DIAGNOSIS — D45 Polycythemia vera: Secondary | ICD-10-CM | POA: Diagnosis not present

## 2016-06-15 DIAGNOSIS — I10 Essential (primary) hypertension: Secondary | ICD-10-CM | POA: Diagnosis not present

## 2016-06-15 DIAGNOSIS — E7800 Pure hypercholesterolemia, unspecified: Secondary | ICD-10-CM | POA: Diagnosis not present

## 2016-06-15 DIAGNOSIS — Z4781 Encounter for orthopedic aftercare following surgical amputation: Secondary | ICD-10-CM | POA: Diagnosis not present

## 2016-06-15 DIAGNOSIS — D509 Iron deficiency anemia, unspecified: Secondary | ICD-10-CM | POA: Diagnosis not present

## 2016-06-18 DIAGNOSIS — D509 Iron deficiency anemia, unspecified: Secondary | ICD-10-CM | POA: Diagnosis not present

## 2016-06-18 DIAGNOSIS — D45 Polycythemia vera: Secondary | ICD-10-CM | POA: Diagnosis not present

## 2016-06-18 DIAGNOSIS — E1151 Type 2 diabetes mellitus with diabetic peripheral angiopathy without gangrene: Secondary | ICD-10-CM | POA: Diagnosis not present

## 2016-06-18 DIAGNOSIS — Z4781 Encounter for orthopedic aftercare following surgical amputation: Secondary | ICD-10-CM | POA: Diagnosis not present

## 2016-06-18 DIAGNOSIS — I10 Essential (primary) hypertension: Secondary | ICD-10-CM | POA: Diagnosis not present

## 2016-06-18 DIAGNOSIS — E7800 Pure hypercholesterolemia, unspecified: Secondary | ICD-10-CM | POA: Diagnosis not present

## 2016-06-19 DIAGNOSIS — E1151 Type 2 diabetes mellitus with diabetic peripheral angiopathy without gangrene: Secondary | ICD-10-CM | POA: Diagnosis not present

## 2016-06-19 DIAGNOSIS — D509 Iron deficiency anemia, unspecified: Secondary | ICD-10-CM | POA: Diagnosis not present

## 2016-06-19 DIAGNOSIS — D45 Polycythemia vera: Secondary | ICD-10-CM | POA: Diagnosis not present

## 2016-06-19 DIAGNOSIS — E7800 Pure hypercholesterolemia, unspecified: Secondary | ICD-10-CM | POA: Diagnosis not present

## 2016-06-19 DIAGNOSIS — B353 Tinea pedis: Secondary | ICD-10-CM | POA: Diagnosis not present

## 2016-06-19 DIAGNOSIS — Z4781 Encounter for orthopedic aftercare following surgical amputation: Secondary | ICD-10-CM | POA: Diagnosis not present

## 2016-06-19 DIAGNOSIS — I10 Essential (primary) hypertension: Secondary | ICD-10-CM | POA: Diagnosis not present

## 2016-06-19 DIAGNOSIS — B351 Tinea unguium: Secondary | ICD-10-CM | POA: Diagnosis not present

## 2016-06-20 DIAGNOSIS — D509 Iron deficiency anemia, unspecified: Secondary | ICD-10-CM | POA: Diagnosis not present

## 2016-06-20 DIAGNOSIS — Z951 Presence of aortocoronary bypass graft: Secondary | ICD-10-CM | POA: Diagnosis not present

## 2016-06-20 DIAGNOSIS — R0602 Shortness of breath: Secondary | ICD-10-CM | POA: Diagnosis not present

## 2016-06-20 DIAGNOSIS — D45 Polycythemia vera: Secondary | ICD-10-CM | POA: Diagnosis not present

## 2016-06-20 DIAGNOSIS — E1151 Type 2 diabetes mellitus with diabetic peripheral angiopathy without gangrene: Secondary | ICD-10-CM | POA: Diagnosis not present

## 2016-06-20 DIAGNOSIS — E7800 Pure hypercholesterolemia, unspecified: Secondary | ICD-10-CM | POA: Diagnosis not present

## 2016-06-20 DIAGNOSIS — Z89611 Acquired absence of right leg above knee: Secondary | ICD-10-CM | POA: Diagnosis not present

## 2016-06-20 DIAGNOSIS — I10 Essential (primary) hypertension: Secondary | ICD-10-CM | POA: Diagnosis not present

## 2016-06-20 DIAGNOSIS — I2510 Atherosclerotic heart disease of native coronary artery without angina pectoris: Secondary | ICD-10-CM | POA: Diagnosis not present

## 2016-06-20 DIAGNOSIS — Z955 Presence of coronary angioplasty implant and graft: Secondary | ICD-10-CM | POA: Diagnosis not present

## 2016-06-20 DIAGNOSIS — Z4781 Encounter for orthopedic aftercare following surgical amputation: Secondary | ICD-10-CM | POA: Diagnosis not present

## 2016-06-20 DIAGNOSIS — I70209 Unspecified atherosclerosis of native arteries of extremities, unspecified extremity: Secondary | ICD-10-CM | POA: Diagnosis not present

## 2016-06-20 DIAGNOSIS — Z8673 Personal history of transient ischemic attack (TIA), and cerebral infarction without residual deficits: Secondary | ICD-10-CM | POA: Diagnosis not present

## 2016-06-20 DIAGNOSIS — E119 Type 2 diabetes mellitus without complications: Secondary | ICD-10-CM | POA: Diagnosis not present

## 2016-06-20 DIAGNOSIS — I493 Ventricular premature depolarization: Secondary | ICD-10-CM | POA: Diagnosis not present

## 2016-06-21 DIAGNOSIS — I10 Essential (primary) hypertension: Secondary | ICD-10-CM | POA: Diagnosis not present

## 2016-06-21 DIAGNOSIS — I493 Ventricular premature depolarization: Secondary | ICD-10-CM | POA: Diagnosis not present

## 2016-06-21 DIAGNOSIS — I2510 Atherosclerotic heart disease of native coronary artery without angina pectoris: Secondary | ICD-10-CM | POA: Diagnosis not present

## 2016-06-22 DIAGNOSIS — E7800 Pure hypercholesterolemia, unspecified: Secondary | ICD-10-CM | POA: Diagnosis not present

## 2016-06-22 DIAGNOSIS — D509 Iron deficiency anemia, unspecified: Secondary | ICD-10-CM | POA: Diagnosis not present

## 2016-06-22 DIAGNOSIS — D45 Polycythemia vera: Secondary | ICD-10-CM | POA: Diagnosis not present

## 2016-06-22 DIAGNOSIS — Z4781 Encounter for orthopedic aftercare following surgical amputation: Secondary | ICD-10-CM | POA: Diagnosis not present

## 2016-06-22 DIAGNOSIS — I10 Essential (primary) hypertension: Secondary | ICD-10-CM | POA: Diagnosis not present

## 2016-06-22 DIAGNOSIS — E1151 Type 2 diabetes mellitus with diabetic peripheral angiopathy without gangrene: Secondary | ICD-10-CM | POA: Diagnosis not present

## 2016-06-25 DIAGNOSIS — Z4781 Encounter for orthopedic aftercare following surgical amputation: Secondary | ICD-10-CM | POA: Diagnosis not present

## 2016-06-25 DIAGNOSIS — E7800 Pure hypercholesterolemia, unspecified: Secondary | ICD-10-CM | POA: Diagnosis not present

## 2016-06-25 DIAGNOSIS — D45 Polycythemia vera: Secondary | ICD-10-CM | POA: Diagnosis not present

## 2016-06-25 DIAGNOSIS — D509 Iron deficiency anemia, unspecified: Secondary | ICD-10-CM | POA: Diagnosis not present

## 2016-06-25 DIAGNOSIS — E1151 Type 2 diabetes mellitus with diabetic peripheral angiopathy without gangrene: Secondary | ICD-10-CM | POA: Diagnosis not present

## 2016-06-25 DIAGNOSIS — I10 Essential (primary) hypertension: Secondary | ICD-10-CM | POA: Diagnosis not present

## 2016-06-26 DIAGNOSIS — S78911D Complete traumatic amputation of right hip and thigh, level unspecified, subsequent encounter: Secondary | ICD-10-CM | POA: Diagnosis not present

## 2016-06-26 DIAGNOSIS — Z0001 Encounter for general adult medical examination with abnormal findings: Secondary | ICD-10-CM | POA: Diagnosis not present

## 2016-06-26 DIAGNOSIS — I739 Peripheral vascular disease, unspecified: Secondary | ICD-10-CM | POA: Diagnosis not present

## 2016-06-26 DIAGNOSIS — E1165 Type 2 diabetes mellitus with hyperglycemia: Secondary | ICD-10-CM | POA: Diagnosis not present

## 2016-06-26 DIAGNOSIS — E785 Hyperlipidemia, unspecified: Secondary | ICD-10-CM | POA: Diagnosis not present

## 2016-06-26 DIAGNOSIS — Z794 Long term (current) use of insulin: Secondary | ICD-10-CM | POA: Diagnosis not present

## 2016-06-26 DIAGNOSIS — I25119 Atherosclerotic heart disease of native coronary artery with unspecified angina pectoris: Secondary | ICD-10-CM | POA: Diagnosis not present

## 2016-06-26 DIAGNOSIS — D469 Myelodysplastic syndrome, unspecified: Secondary | ICD-10-CM | POA: Diagnosis not present

## 2016-06-26 DIAGNOSIS — Z23 Encounter for immunization: Secondary | ICD-10-CM | POA: Diagnosis not present

## 2016-06-27 DIAGNOSIS — Z4781 Encounter for orthopedic aftercare following surgical amputation: Secondary | ICD-10-CM | POA: Diagnosis not present

## 2016-06-27 DIAGNOSIS — D45 Polycythemia vera: Secondary | ICD-10-CM | POA: Diagnosis not present

## 2016-06-27 DIAGNOSIS — I10 Essential (primary) hypertension: Secondary | ICD-10-CM | POA: Diagnosis not present

## 2016-06-27 DIAGNOSIS — E7800 Pure hypercholesterolemia, unspecified: Secondary | ICD-10-CM | POA: Diagnosis not present

## 2016-06-27 DIAGNOSIS — E1151 Type 2 diabetes mellitus with diabetic peripheral angiopathy without gangrene: Secondary | ICD-10-CM | POA: Diagnosis not present

## 2016-06-27 DIAGNOSIS — D509 Iron deficiency anemia, unspecified: Secondary | ICD-10-CM | POA: Diagnosis not present

## 2016-06-29 DIAGNOSIS — I10 Essential (primary) hypertension: Secondary | ICD-10-CM | POA: Diagnosis not present

## 2016-06-29 DIAGNOSIS — D509 Iron deficiency anemia, unspecified: Secondary | ICD-10-CM | POA: Diagnosis not present

## 2016-06-29 DIAGNOSIS — Z4781 Encounter for orthopedic aftercare following surgical amputation: Secondary | ICD-10-CM | POA: Diagnosis not present

## 2016-06-29 DIAGNOSIS — D45 Polycythemia vera: Secondary | ICD-10-CM | POA: Diagnosis not present

## 2016-06-29 DIAGNOSIS — E1151 Type 2 diabetes mellitus with diabetic peripheral angiopathy without gangrene: Secondary | ICD-10-CM | POA: Diagnosis not present

## 2016-06-29 DIAGNOSIS — E7800 Pure hypercholesterolemia, unspecified: Secondary | ICD-10-CM | POA: Diagnosis not present

## 2016-07-02 DIAGNOSIS — E1151 Type 2 diabetes mellitus with diabetic peripheral angiopathy without gangrene: Secondary | ICD-10-CM | POA: Diagnosis not present

## 2016-07-02 DIAGNOSIS — D509 Iron deficiency anemia, unspecified: Secondary | ICD-10-CM | POA: Diagnosis not present

## 2016-07-02 DIAGNOSIS — Z4781 Encounter for orthopedic aftercare following surgical amputation: Secondary | ICD-10-CM | POA: Diagnosis not present

## 2016-07-02 DIAGNOSIS — D45 Polycythemia vera: Secondary | ICD-10-CM | POA: Diagnosis not present

## 2016-07-02 DIAGNOSIS — I10 Essential (primary) hypertension: Secondary | ICD-10-CM | POA: Diagnosis not present

## 2016-07-02 DIAGNOSIS — E7800 Pure hypercholesterolemia, unspecified: Secondary | ICD-10-CM | POA: Diagnosis not present

## 2016-07-03 DIAGNOSIS — Z89431 Acquired absence of right foot: Secondary | ICD-10-CM | POA: Diagnosis not present

## 2016-07-03 DIAGNOSIS — E1151 Type 2 diabetes mellitus with diabetic peripheral angiopathy without gangrene: Secondary | ICD-10-CM | POA: Diagnosis not present

## 2016-07-04 DIAGNOSIS — D45 Polycythemia vera: Secondary | ICD-10-CM | POA: Diagnosis not present

## 2016-07-04 DIAGNOSIS — I10 Essential (primary) hypertension: Secondary | ICD-10-CM | POA: Diagnosis not present

## 2016-07-04 DIAGNOSIS — D509 Iron deficiency anemia, unspecified: Secondary | ICD-10-CM | POA: Diagnosis not present

## 2016-07-04 DIAGNOSIS — E7800 Pure hypercholesterolemia, unspecified: Secondary | ICD-10-CM | POA: Diagnosis not present

## 2016-07-04 DIAGNOSIS — Z4781 Encounter for orthopedic aftercare following surgical amputation: Secondary | ICD-10-CM | POA: Diagnosis not present

## 2016-07-04 DIAGNOSIS — E1151 Type 2 diabetes mellitus with diabetic peripheral angiopathy without gangrene: Secondary | ICD-10-CM | POA: Diagnosis not present

## 2016-07-05 DIAGNOSIS — D471 Chronic myeloproliferative disease: Secondary | ICD-10-CM | POA: Diagnosis not present

## 2016-07-06 DIAGNOSIS — E7800 Pure hypercholesterolemia, unspecified: Secondary | ICD-10-CM | POA: Diagnosis not present

## 2016-07-06 DIAGNOSIS — I10 Essential (primary) hypertension: Secondary | ICD-10-CM | POA: Diagnosis not present

## 2016-07-06 DIAGNOSIS — Z4781 Encounter for orthopedic aftercare following surgical amputation: Secondary | ICD-10-CM | POA: Diagnosis not present

## 2016-07-06 DIAGNOSIS — D509 Iron deficiency anemia, unspecified: Secondary | ICD-10-CM | POA: Diagnosis not present

## 2016-07-06 DIAGNOSIS — D45 Polycythemia vera: Secondary | ICD-10-CM | POA: Diagnosis not present

## 2016-07-06 DIAGNOSIS — E1151 Type 2 diabetes mellitus with diabetic peripheral angiopathy without gangrene: Secondary | ICD-10-CM | POA: Diagnosis not present

## 2016-07-09 DIAGNOSIS — I10 Essential (primary) hypertension: Secondary | ICD-10-CM | POA: Diagnosis not present

## 2016-07-09 DIAGNOSIS — Z4781 Encounter for orthopedic aftercare following surgical amputation: Secondary | ICD-10-CM | POA: Diagnosis not present

## 2016-07-09 DIAGNOSIS — E7800 Pure hypercholesterolemia, unspecified: Secondary | ICD-10-CM | POA: Diagnosis not present

## 2016-07-09 DIAGNOSIS — D45 Polycythemia vera: Secondary | ICD-10-CM | POA: Diagnosis not present

## 2016-07-09 DIAGNOSIS — D509 Iron deficiency anemia, unspecified: Secondary | ICD-10-CM | POA: Diagnosis not present

## 2016-07-09 DIAGNOSIS — E1151 Type 2 diabetes mellitus with diabetic peripheral angiopathy without gangrene: Secondary | ICD-10-CM | POA: Diagnosis not present

## 2016-07-11 DIAGNOSIS — E1151 Type 2 diabetes mellitus with diabetic peripheral angiopathy without gangrene: Secondary | ICD-10-CM | POA: Diagnosis not present

## 2016-07-11 DIAGNOSIS — E7800 Pure hypercholesterolemia, unspecified: Secondary | ICD-10-CM | POA: Diagnosis not present

## 2016-07-11 DIAGNOSIS — Z4781 Encounter for orthopedic aftercare following surgical amputation: Secondary | ICD-10-CM | POA: Diagnosis not present

## 2016-07-11 DIAGNOSIS — D45 Polycythemia vera: Secondary | ICD-10-CM | POA: Diagnosis not present

## 2016-07-11 DIAGNOSIS — D509 Iron deficiency anemia, unspecified: Secondary | ICD-10-CM | POA: Diagnosis not present

## 2016-07-11 DIAGNOSIS — I10 Essential (primary) hypertension: Secondary | ICD-10-CM | POA: Diagnosis not present

## 2016-07-18 DIAGNOSIS — I10 Essential (primary) hypertension: Secondary | ICD-10-CM | POA: Diagnosis not present

## 2016-07-18 DIAGNOSIS — D471 Chronic myeloproliferative disease: Secondary | ICD-10-CM | POA: Diagnosis not present

## 2016-07-18 DIAGNOSIS — E1151 Type 2 diabetes mellitus with diabetic peripheral angiopathy without gangrene: Secondary | ICD-10-CM | POA: Diagnosis not present

## 2016-07-18 DIAGNOSIS — Z4781 Encounter for orthopedic aftercare following surgical amputation: Secondary | ICD-10-CM | POA: Diagnosis not present

## 2016-07-18 DIAGNOSIS — D509 Iron deficiency anemia, unspecified: Secondary | ICD-10-CM | POA: Diagnosis not present

## 2016-07-18 DIAGNOSIS — D45 Polycythemia vera: Secondary | ICD-10-CM | POA: Diagnosis not present

## 2016-07-18 DIAGNOSIS — E7800 Pure hypercholesterolemia, unspecified: Secondary | ICD-10-CM | POA: Diagnosis not present

## 2016-07-20 DIAGNOSIS — D509 Iron deficiency anemia, unspecified: Secondary | ICD-10-CM | POA: Diagnosis not present

## 2016-07-20 DIAGNOSIS — I10 Essential (primary) hypertension: Secondary | ICD-10-CM | POA: Diagnosis not present

## 2016-07-20 DIAGNOSIS — E7800 Pure hypercholesterolemia, unspecified: Secondary | ICD-10-CM | POA: Diagnosis not present

## 2016-07-20 DIAGNOSIS — Z4781 Encounter for orthopedic aftercare following surgical amputation: Secondary | ICD-10-CM | POA: Diagnosis not present

## 2016-07-20 DIAGNOSIS — E1151 Type 2 diabetes mellitus with diabetic peripheral angiopathy without gangrene: Secondary | ICD-10-CM | POA: Diagnosis not present

## 2016-07-20 DIAGNOSIS — D45 Polycythemia vera: Secondary | ICD-10-CM | POA: Diagnosis not present

## 2016-07-21 DIAGNOSIS — Z7984 Long term (current) use of oral hypoglycemic drugs: Secondary | ICD-10-CM | POA: Diagnosis not present

## 2016-07-21 DIAGNOSIS — Z7902 Long term (current) use of antithrombotics/antiplatelets: Secondary | ICD-10-CM | POA: Diagnosis not present

## 2016-07-21 DIAGNOSIS — Z4781 Encounter for orthopedic aftercare following surgical amputation: Secondary | ICD-10-CM | POA: Diagnosis not present

## 2016-07-21 DIAGNOSIS — Z7982 Long term (current) use of aspirin: Secondary | ICD-10-CM | POA: Diagnosis not present

## 2016-07-21 DIAGNOSIS — E1151 Type 2 diabetes mellitus with diabetic peripheral angiopathy without gangrene: Secondary | ICD-10-CM | POA: Diagnosis not present

## 2016-07-21 DIAGNOSIS — I2510 Atherosclerotic heart disease of native coronary artery without angina pectoris: Secondary | ICD-10-CM | POA: Diagnosis not present

## 2016-07-21 DIAGNOSIS — I10 Essential (primary) hypertension: Secondary | ICD-10-CM | POA: Diagnosis not present

## 2016-07-21 DIAGNOSIS — Z89611 Acquired absence of right leg above knee: Secondary | ICD-10-CM | POA: Diagnosis not present

## 2016-07-23 DIAGNOSIS — I25708 Atherosclerosis of coronary artery bypass graft(s), unspecified, with other forms of angina pectoris: Secondary | ICD-10-CM | POA: Diagnosis not present

## 2016-07-23 DIAGNOSIS — I10 Essential (primary) hypertension: Secondary | ICD-10-CM | POA: Diagnosis not present

## 2016-07-23 DIAGNOSIS — Z7984 Long term (current) use of oral hypoglycemic drugs: Secondary | ICD-10-CM | POA: Diagnosis not present

## 2016-07-23 DIAGNOSIS — I2510 Atherosclerotic heart disease of native coronary artery without angina pectoris: Secondary | ICD-10-CM | POA: Diagnosis not present

## 2016-07-23 DIAGNOSIS — E1151 Type 2 diabetes mellitus with diabetic peripheral angiopathy without gangrene: Secondary | ICD-10-CM | POA: Diagnosis not present

## 2016-07-23 DIAGNOSIS — D45 Polycythemia vera: Secondary | ICD-10-CM | POA: Diagnosis not present

## 2016-07-23 DIAGNOSIS — I639 Cerebral infarction, unspecified: Secondary | ICD-10-CM | POA: Diagnosis not present

## 2016-07-23 DIAGNOSIS — Z89611 Acquired absence of right leg above knee: Secondary | ICD-10-CM | POA: Diagnosis not present

## 2016-07-23 DIAGNOSIS — Z4781 Encounter for orthopedic aftercare following surgical amputation: Secondary | ICD-10-CM | POA: Diagnosis not present

## 2016-07-23 DIAGNOSIS — I779 Disorder of arteries and arterioles, unspecified: Secondary | ICD-10-CM | POA: Diagnosis not present

## 2016-07-25 DIAGNOSIS — I2510 Atherosclerotic heart disease of native coronary artery without angina pectoris: Secondary | ICD-10-CM | POA: Diagnosis not present

## 2016-07-25 DIAGNOSIS — I10 Essential (primary) hypertension: Secondary | ICD-10-CM | POA: Diagnosis not present

## 2016-07-25 DIAGNOSIS — Z4781 Encounter for orthopedic aftercare following surgical amputation: Secondary | ICD-10-CM | POA: Diagnosis not present

## 2016-07-25 DIAGNOSIS — E1151 Type 2 diabetes mellitus with diabetic peripheral angiopathy without gangrene: Secondary | ICD-10-CM | POA: Diagnosis not present

## 2016-07-25 DIAGNOSIS — Z89611 Acquired absence of right leg above knee: Secondary | ICD-10-CM | POA: Diagnosis not present

## 2016-07-25 DIAGNOSIS — Z7984 Long term (current) use of oral hypoglycemic drugs: Secondary | ICD-10-CM | POA: Diagnosis not present

## 2016-07-27 DIAGNOSIS — I2510 Atherosclerotic heart disease of native coronary artery without angina pectoris: Secondary | ICD-10-CM | POA: Diagnosis not present

## 2016-07-27 DIAGNOSIS — Z4781 Encounter for orthopedic aftercare following surgical amputation: Secondary | ICD-10-CM | POA: Diagnosis not present

## 2016-07-27 DIAGNOSIS — Z89611 Acquired absence of right leg above knee: Secondary | ICD-10-CM | POA: Diagnosis not present

## 2016-07-27 DIAGNOSIS — Z7984 Long term (current) use of oral hypoglycemic drugs: Secondary | ICD-10-CM | POA: Diagnosis not present

## 2016-07-27 DIAGNOSIS — I10 Essential (primary) hypertension: Secondary | ICD-10-CM | POA: Diagnosis not present

## 2016-07-27 DIAGNOSIS — E1151 Type 2 diabetes mellitus with diabetic peripheral angiopathy without gangrene: Secondary | ICD-10-CM | POA: Diagnosis not present

## 2016-07-30 ENCOUNTER — Ambulatory Visit: Payer: 59 | Admitting: Neurology

## 2016-07-30 DIAGNOSIS — Z4781 Encounter for orthopedic aftercare following surgical amputation: Secondary | ICD-10-CM | POA: Diagnosis not present

## 2016-07-30 DIAGNOSIS — E1151 Type 2 diabetes mellitus with diabetic peripheral angiopathy without gangrene: Secondary | ICD-10-CM | POA: Diagnosis not present

## 2016-07-30 DIAGNOSIS — Z029 Encounter for administrative examinations, unspecified: Secondary | ICD-10-CM

## 2016-07-30 DIAGNOSIS — Z89611 Acquired absence of right leg above knee: Secondary | ICD-10-CM | POA: Diagnosis not present

## 2016-07-30 DIAGNOSIS — I2510 Atherosclerotic heart disease of native coronary artery without angina pectoris: Secondary | ICD-10-CM | POA: Diagnosis not present

## 2016-07-30 DIAGNOSIS — Z7984 Long term (current) use of oral hypoglycemic drugs: Secondary | ICD-10-CM | POA: Diagnosis not present

## 2016-07-30 DIAGNOSIS — I10 Essential (primary) hypertension: Secondary | ICD-10-CM | POA: Diagnosis not present

## 2016-07-31 DIAGNOSIS — Z89611 Acquired absence of right leg above knee: Secondary | ICD-10-CM | POA: Diagnosis not present

## 2016-07-31 DIAGNOSIS — I10 Essential (primary) hypertension: Secondary | ICD-10-CM | POA: Diagnosis not present

## 2016-07-31 DIAGNOSIS — I2510 Atherosclerotic heart disease of native coronary artery without angina pectoris: Secondary | ICD-10-CM | POA: Diagnosis not present

## 2016-07-31 DIAGNOSIS — E1151 Type 2 diabetes mellitus with diabetic peripheral angiopathy without gangrene: Secondary | ICD-10-CM | POA: Diagnosis not present

## 2016-07-31 DIAGNOSIS — Z7984 Long term (current) use of oral hypoglycemic drugs: Secondary | ICD-10-CM | POA: Diagnosis not present

## 2016-07-31 DIAGNOSIS — Z4781 Encounter for orthopedic aftercare following surgical amputation: Secondary | ICD-10-CM | POA: Diagnosis not present

## 2016-08-01 DIAGNOSIS — R350 Frequency of micturition: Secondary | ICD-10-CM | POA: Diagnosis not present

## 2016-08-01 DIAGNOSIS — S78911D Complete traumatic amputation of right hip and thigh, level unspecified, subsequent encounter: Secondary | ICD-10-CM | POA: Diagnosis not present

## 2016-08-01 DIAGNOSIS — Z89611 Acquired absence of right leg above knee: Secondary | ICD-10-CM | POA: Diagnosis not present

## 2016-08-01 DIAGNOSIS — E1151 Type 2 diabetes mellitus with diabetic peripheral angiopathy without gangrene: Secondary | ICD-10-CM | POA: Diagnosis not present

## 2016-08-01 DIAGNOSIS — I10 Essential (primary) hypertension: Secondary | ICD-10-CM | POA: Diagnosis not present

## 2016-08-01 DIAGNOSIS — F0150 Vascular dementia without behavioral disturbance: Secondary | ICD-10-CM | POA: Diagnosis not present

## 2016-08-01 DIAGNOSIS — Z794 Long term (current) use of insulin: Secondary | ICD-10-CM | POA: Diagnosis not present

## 2016-08-01 DIAGNOSIS — I2510 Atherosclerotic heart disease of native coronary artery without angina pectoris: Secondary | ICD-10-CM | POA: Diagnosis not present

## 2016-08-01 DIAGNOSIS — E785 Hyperlipidemia, unspecified: Secondary | ICD-10-CM | POA: Diagnosis not present

## 2016-08-01 DIAGNOSIS — I25119 Atherosclerotic heart disease of native coronary artery with unspecified angina pectoris: Secondary | ICD-10-CM | POA: Diagnosis not present

## 2016-08-01 DIAGNOSIS — D471 Chronic myeloproliferative disease: Secondary | ICD-10-CM | POA: Diagnosis not present

## 2016-08-01 DIAGNOSIS — D469 Myelodysplastic syndrome, unspecified: Secondary | ICD-10-CM | POA: Diagnosis not present

## 2016-08-01 DIAGNOSIS — G600 Hereditary motor and sensory neuropathy: Secondary | ICD-10-CM | POA: Diagnosis not present

## 2016-08-01 DIAGNOSIS — I739 Peripheral vascular disease, unspecified: Secondary | ICD-10-CM | POA: Diagnosis not present

## 2016-08-01 DIAGNOSIS — E1165 Type 2 diabetes mellitus with hyperglycemia: Secondary | ICD-10-CM | POA: Diagnosis not present

## 2016-08-01 DIAGNOSIS — Z7984 Long term (current) use of oral hypoglycemic drugs: Secondary | ICD-10-CM | POA: Diagnosis not present

## 2016-08-01 DIAGNOSIS — Z4781 Encounter for orthopedic aftercare following surgical amputation: Secondary | ICD-10-CM | POA: Diagnosis not present

## 2016-08-03 DIAGNOSIS — M5002 Cervical disc disorder with myelopathy, mid-cervical region, unspecified level: Secondary | ICD-10-CM | POA: Diagnosis not present

## 2016-08-06 DIAGNOSIS — I2510 Atherosclerotic heart disease of native coronary artery without angina pectoris: Secondary | ICD-10-CM | POA: Diagnosis not present

## 2016-08-06 DIAGNOSIS — Z4781 Encounter for orthopedic aftercare following surgical amputation: Secondary | ICD-10-CM | POA: Diagnosis not present

## 2016-08-06 DIAGNOSIS — Z89611 Acquired absence of right leg above knee: Secondary | ICD-10-CM | POA: Diagnosis not present

## 2016-08-06 DIAGNOSIS — E1151 Type 2 diabetes mellitus with diabetic peripheral angiopathy without gangrene: Secondary | ICD-10-CM | POA: Diagnosis not present

## 2016-08-06 DIAGNOSIS — I10 Essential (primary) hypertension: Secondary | ICD-10-CM | POA: Diagnosis not present

## 2016-08-06 DIAGNOSIS — Z7984 Long term (current) use of oral hypoglycemic drugs: Secondary | ICD-10-CM | POA: Diagnosis not present

## 2016-08-08 DIAGNOSIS — Z89611 Acquired absence of right leg above knee: Secondary | ICD-10-CM | POA: Diagnosis not present

## 2016-08-08 DIAGNOSIS — Z4781 Encounter for orthopedic aftercare following surgical amputation: Secondary | ICD-10-CM | POA: Diagnosis not present

## 2016-08-08 DIAGNOSIS — I2510 Atherosclerotic heart disease of native coronary artery without angina pectoris: Secondary | ICD-10-CM | POA: Diagnosis not present

## 2016-08-08 DIAGNOSIS — Z7984 Long term (current) use of oral hypoglycemic drugs: Secondary | ICD-10-CM | POA: Diagnosis not present

## 2016-08-08 DIAGNOSIS — I10 Essential (primary) hypertension: Secondary | ICD-10-CM | POA: Diagnosis not present

## 2016-08-08 DIAGNOSIS — E1151 Type 2 diabetes mellitus with diabetic peripheral angiopathy without gangrene: Secondary | ICD-10-CM | POA: Diagnosis not present

## 2016-08-13 DIAGNOSIS — Z7984 Long term (current) use of oral hypoglycemic drugs: Secondary | ICD-10-CM | POA: Diagnosis not present

## 2016-08-13 DIAGNOSIS — Z4781 Encounter for orthopedic aftercare following surgical amputation: Secondary | ICD-10-CM | POA: Diagnosis not present

## 2016-08-13 DIAGNOSIS — Z89611 Acquired absence of right leg above knee: Secondary | ICD-10-CM | POA: Diagnosis not present

## 2016-08-13 DIAGNOSIS — I10 Essential (primary) hypertension: Secondary | ICD-10-CM | POA: Diagnosis not present

## 2016-08-13 DIAGNOSIS — I2510 Atherosclerotic heart disease of native coronary artery without angina pectoris: Secondary | ICD-10-CM | POA: Diagnosis not present

## 2016-08-13 DIAGNOSIS — E1151 Type 2 diabetes mellitus with diabetic peripheral angiopathy without gangrene: Secondary | ICD-10-CM | POA: Diagnosis not present

## 2016-08-16 DIAGNOSIS — Z89611 Acquired absence of right leg above knee: Secondary | ICD-10-CM | POA: Diagnosis not present

## 2016-08-16 DIAGNOSIS — N401 Enlarged prostate with lower urinary tract symptoms: Secondary | ICD-10-CM | POA: Diagnosis not present

## 2016-08-16 DIAGNOSIS — I2510 Atherosclerotic heart disease of native coronary artery without angina pectoris: Secondary | ICD-10-CM | POA: Diagnosis not present

## 2016-08-16 DIAGNOSIS — Z4781 Encounter for orthopedic aftercare following surgical amputation: Secondary | ICD-10-CM | POA: Diagnosis not present

## 2016-08-16 DIAGNOSIS — E119 Type 2 diabetes mellitus without complications: Secondary | ICD-10-CM | POA: Diagnosis not present

## 2016-08-16 DIAGNOSIS — Z7984 Long term (current) use of oral hypoglycemic drugs: Secondary | ICD-10-CM | POA: Diagnosis not present

## 2016-08-16 DIAGNOSIS — E1151 Type 2 diabetes mellitus with diabetic peripheral angiopathy without gangrene: Secondary | ICD-10-CM | POA: Diagnosis not present

## 2016-08-16 DIAGNOSIS — I739 Peripheral vascular disease, unspecified: Secondary | ICD-10-CM | POA: Diagnosis not present

## 2016-08-16 DIAGNOSIS — I10 Essential (primary) hypertension: Secondary | ICD-10-CM | POA: Diagnosis not present

## 2016-08-16 DIAGNOSIS — N529 Male erectile dysfunction, unspecified: Secondary | ICD-10-CM | POA: Diagnosis not present

## 2016-08-21 DIAGNOSIS — I2510 Atherosclerotic heart disease of native coronary artery without angina pectoris: Secondary | ICD-10-CM | POA: Diagnosis not present

## 2016-08-21 DIAGNOSIS — Z89611 Acquired absence of right leg above knee: Secondary | ICD-10-CM | POA: Diagnosis not present

## 2016-08-21 DIAGNOSIS — I10 Essential (primary) hypertension: Secondary | ICD-10-CM | POA: Diagnosis not present

## 2016-08-21 DIAGNOSIS — Z4781 Encounter for orthopedic aftercare following surgical amputation: Secondary | ICD-10-CM | POA: Diagnosis not present

## 2016-08-21 DIAGNOSIS — B351 Tinea unguium: Secondary | ICD-10-CM | POA: Diagnosis not present

## 2016-08-21 DIAGNOSIS — E1151 Type 2 diabetes mellitus with diabetic peripheral angiopathy without gangrene: Secondary | ICD-10-CM | POA: Diagnosis not present

## 2016-08-21 DIAGNOSIS — Z7984 Long term (current) use of oral hypoglycemic drugs: Secondary | ICD-10-CM | POA: Diagnosis not present

## 2016-08-21 DIAGNOSIS — L601 Onycholysis: Secondary | ICD-10-CM | POA: Diagnosis not present

## 2016-08-22 DIAGNOSIS — D471 Chronic myeloproliferative disease: Secondary | ICD-10-CM | POA: Diagnosis not present

## 2016-08-24 DIAGNOSIS — I10 Essential (primary) hypertension: Secondary | ICD-10-CM | POA: Diagnosis not present

## 2016-08-24 DIAGNOSIS — Z7984 Long term (current) use of oral hypoglycemic drugs: Secondary | ICD-10-CM | POA: Diagnosis not present

## 2016-08-24 DIAGNOSIS — Z89611 Acquired absence of right leg above knee: Secondary | ICD-10-CM | POA: Diagnosis not present

## 2016-08-24 DIAGNOSIS — Z4781 Encounter for orthopedic aftercare following surgical amputation: Secondary | ICD-10-CM | POA: Diagnosis not present

## 2016-08-24 DIAGNOSIS — I2510 Atherosclerotic heart disease of native coronary artery without angina pectoris: Secondary | ICD-10-CM | POA: Diagnosis not present

## 2016-08-24 DIAGNOSIS — E1151 Type 2 diabetes mellitus with diabetic peripheral angiopathy without gangrene: Secondary | ICD-10-CM | POA: Diagnosis not present

## 2016-08-28 DIAGNOSIS — Z4781 Encounter for orthopedic aftercare following surgical amputation: Secondary | ICD-10-CM | POA: Diagnosis not present

## 2016-08-28 DIAGNOSIS — I10 Essential (primary) hypertension: Secondary | ICD-10-CM | POA: Diagnosis not present

## 2016-08-28 DIAGNOSIS — Z7984 Long term (current) use of oral hypoglycemic drugs: Secondary | ICD-10-CM | POA: Diagnosis not present

## 2016-08-28 DIAGNOSIS — I2510 Atherosclerotic heart disease of native coronary artery without angina pectoris: Secondary | ICD-10-CM | POA: Diagnosis not present

## 2016-08-28 DIAGNOSIS — Z89611 Acquired absence of right leg above knee: Secondary | ICD-10-CM | POA: Diagnosis not present

## 2016-08-28 DIAGNOSIS — E1151 Type 2 diabetes mellitus with diabetic peripheral angiopathy without gangrene: Secondary | ICD-10-CM | POA: Diagnosis not present

## 2016-08-29 DIAGNOSIS — Z89611 Acquired absence of right leg above knee: Secondary | ICD-10-CM | POA: Diagnosis not present

## 2016-08-29 DIAGNOSIS — I10 Essential (primary) hypertension: Secondary | ICD-10-CM | POA: Diagnosis not present

## 2016-08-29 DIAGNOSIS — Z4781 Encounter for orthopedic aftercare following surgical amputation: Secondary | ICD-10-CM | POA: Diagnosis not present

## 2016-08-29 DIAGNOSIS — E1151 Type 2 diabetes mellitus with diabetic peripheral angiopathy without gangrene: Secondary | ICD-10-CM | POA: Diagnosis not present

## 2016-08-29 DIAGNOSIS — Z7984 Long term (current) use of oral hypoglycemic drugs: Secondary | ICD-10-CM | POA: Diagnosis not present

## 2016-08-29 DIAGNOSIS — I2510 Atherosclerotic heart disease of native coronary artery without angina pectoris: Secondary | ICD-10-CM | POA: Diagnosis not present

## 2016-09-03 DIAGNOSIS — Z7984 Long term (current) use of oral hypoglycemic drugs: Secondary | ICD-10-CM | POA: Diagnosis not present

## 2016-09-03 DIAGNOSIS — F320 Major depressive disorder, single episode, mild: Secondary | ICD-10-CM | POA: Diagnosis not present

## 2016-09-03 DIAGNOSIS — Z8679 Personal history of other diseases of the circulatory system: Secondary | ICD-10-CM | POA: Diagnosis not present

## 2016-09-03 DIAGNOSIS — I10 Essential (primary) hypertension: Secondary | ICD-10-CM | POA: Diagnosis not present

## 2016-09-03 DIAGNOSIS — R638 Other symptoms and signs concerning food and fluid intake: Secondary | ICD-10-CM | POA: Diagnosis not present

## 2016-09-03 DIAGNOSIS — E1151 Type 2 diabetes mellitus with diabetic peripheral angiopathy without gangrene: Secondary | ICD-10-CM | POA: Diagnosis not present

## 2016-09-03 DIAGNOSIS — I2510 Atherosclerotic heart disease of native coronary artery without angina pectoris: Secondary | ICD-10-CM | POA: Diagnosis not present

## 2016-09-03 DIAGNOSIS — Z4781 Encounter for orthopedic aftercare following surgical amputation: Secondary | ICD-10-CM | POA: Diagnosis not present

## 2016-09-03 DIAGNOSIS — F0150 Vascular dementia without behavioral disturbance: Secondary | ICD-10-CM | POA: Diagnosis not present

## 2016-09-03 DIAGNOSIS — Z89611 Acquired absence of right leg above knee: Secondary | ICD-10-CM | POA: Diagnosis not present

## 2016-09-03 DIAGNOSIS — I25720 Atherosclerosis of autologous artery coronary artery bypass graft(s) with unstable angina pectoris: Secondary | ICD-10-CM | POA: Diagnosis not present

## 2016-09-03 DIAGNOSIS — I25708 Atherosclerosis of coronary artery bypass graft(s), unspecified, with other forms of angina pectoris: Secondary | ICD-10-CM | POA: Diagnosis not present

## 2016-09-03 DIAGNOSIS — Z515 Encounter for palliative care: Secondary | ICD-10-CM | POA: Diagnosis not present

## 2016-09-03 DIAGNOSIS — I779 Disorder of arteries and arterioles, unspecified: Secondary | ICD-10-CM | POA: Diagnosis not present

## 2016-09-07 DIAGNOSIS — Z9889 Other specified postprocedural states: Secondary | ICD-10-CM | POA: Diagnosis not present

## 2016-09-12 DIAGNOSIS — G40419 Other generalized epilepsy and epileptic syndromes, intractable, without status epilepticus: Secondary | ICD-10-CM | POA: Diagnosis not present

## 2016-09-12 DIAGNOSIS — E1151 Type 2 diabetes mellitus with diabetic peripheral angiopathy without gangrene: Secondary | ICD-10-CM | POA: Diagnosis not present

## 2016-09-12 DIAGNOSIS — Z89611 Acquired absence of right leg above knee: Secondary | ICD-10-CM | POA: Diagnosis not present

## 2016-09-12 DIAGNOSIS — Z4781 Encounter for orthopedic aftercare following surgical amputation: Secondary | ICD-10-CM | POA: Diagnosis not present

## 2016-09-12 DIAGNOSIS — I10 Essential (primary) hypertension: Secondary | ICD-10-CM | POA: Diagnosis not present

## 2016-09-12 DIAGNOSIS — I2510 Atherosclerotic heart disease of native coronary artery without angina pectoris: Secondary | ICD-10-CM | POA: Diagnosis not present

## 2016-09-12 DIAGNOSIS — G40009 Localization-related (focal) (partial) idiopathic epilepsy and epileptic syndromes with seizures of localized onset, not intractable, without status epilepticus: Secondary | ICD-10-CM | POA: Diagnosis not present

## 2016-09-12 DIAGNOSIS — G40409 Other generalized epilepsy and epileptic syndromes, not intractable, without status epilepticus: Secondary | ICD-10-CM | POA: Diagnosis not present

## 2016-09-12 DIAGNOSIS — Z7984 Long term (current) use of oral hypoglycemic drugs: Secondary | ICD-10-CM | POA: Diagnosis not present

## 2016-09-13 DIAGNOSIS — M5002 Cervical disc disorder with myelopathy, mid-cervical region, unspecified level: Secondary | ICD-10-CM | POA: Diagnosis not present

## 2016-09-13 DIAGNOSIS — D471 Chronic myeloproliferative disease: Secondary | ICD-10-CM | POA: Diagnosis not present

## 2016-09-25 DIAGNOSIS — G40009 Localization-related (focal) (partial) idiopathic epilepsy and epileptic syndromes with seizures of localized onset, not intractable, without status epilepticus: Secondary | ICD-10-CM | POA: Diagnosis not present

## 2016-09-25 DIAGNOSIS — G40409 Other generalized epilepsy and epileptic syndromes, not intractable, without status epilepticus: Secondary | ICD-10-CM | POA: Diagnosis not present

## 2016-09-25 DIAGNOSIS — G40419 Other generalized epilepsy and epileptic syndromes, intractable, without status epilepticus: Secondary | ICD-10-CM | POA: Diagnosis not present

## 2016-09-27 DIAGNOSIS — R9439 Abnormal result of other cardiovascular function study: Secondary | ICD-10-CM | POA: Diagnosis not present

## 2016-09-27 DIAGNOSIS — Z8679 Personal history of other diseases of the circulatory system: Secondary | ICD-10-CM | POA: Diagnosis not present

## 2016-09-27 DIAGNOSIS — I779 Disorder of arteries and arterioles, unspecified: Secondary | ICD-10-CM | POA: Diagnosis not present

## 2016-09-27 DIAGNOSIS — D45 Polycythemia vera: Secondary | ICD-10-CM | POA: Diagnosis not present

## 2016-10-11 DIAGNOSIS — D471 Chronic myeloproliferative disease: Secondary | ICD-10-CM | POA: Diagnosis not present

## 2016-11-01 DIAGNOSIS — L601 Onycholysis: Secondary | ICD-10-CM | POA: Diagnosis not present

## 2016-11-01 DIAGNOSIS — B351 Tinea unguium: Secondary | ICD-10-CM | POA: Diagnosis not present

## 2016-11-01 DIAGNOSIS — E1151 Type 2 diabetes mellitus with diabetic peripheral angiopathy without gangrene: Secondary | ICD-10-CM | POA: Diagnosis not present

## 2016-11-05 DIAGNOSIS — R262 Difficulty in walking, not elsewhere classified: Secondary | ICD-10-CM | POA: Diagnosis not present

## 2016-11-07 DIAGNOSIS — R262 Difficulty in walking, not elsewhere classified: Secondary | ICD-10-CM | POA: Diagnosis not present

## 2016-11-09 DIAGNOSIS — R262 Difficulty in walking, not elsewhere classified: Secondary | ICD-10-CM | POA: Diagnosis not present

## 2016-11-12 DIAGNOSIS — R262 Difficulty in walking, not elsewhere classified: Secondary | ICD-10-CM | POA: Diagnosis not present

## 2016-11-14 DIAGNOSIS — R262 Difficulty in walking, not elsewhere classified: Secondary | ICD-10-CM | POA: Diagnosis not present

## 2016-11-16 DIAGNOSIS — R262 Difficulty in walking, not elsewhere classified: Secondary | ICD-10-CM | POA: Diagnosis not present

## 2016-11-19 DIAGNOSIS — I25708 Atherosclerosis of coronary artery bypass graft(s), unspecified, with other forms of angina pectoris: Secondary | ICD-10-CM | POA: Diagnosis not present

## 2016-11-19 DIAGNOSIS — R262 Difficulty in walking, not elsewhere classified: Secondary | ICD-10-CM | POA: Diagnosis not present

## 2016-11-19 DIAGNOSIS — Z8679 Personal history of other diseases of the circulatory system: Secondary | ICD-10-CM | POA: Diagnosis not present

## 2016-11-19 DIAGNOSIS — R9439 Abnormal result of other cardiovascular function study: Secondary | ICD-10-CM | POA: Diagnosis not present

## 2016-11-19 DIAGNOSIS — Z955 Presence of coronary angioplasty implant and graft: Secondary | ICD-10-CM | POA: Diagnosis not present

## 2016-11-21 DIAGNOSIS — R262 Difficulty in walking, not elsewhere classified: Secondary | ICD-10-CM | POA: Diagnosis not present

## 2016-11-22 DIAGNOSIS — D471 Chronic myeloproliferative disease: Secondary | ICD-10-CM | POA: Diagnosis not present

## 2016-11-23 DIAGNOSIS — R262 Difficulty in walking, not elsewhere classified: Secondary | ICD-10-CM | POA: Diagnosis not present

## 2016-11-26 DIAGNOSIS — R262 Difficulty in walking, not elsewhere classified: Secondary | ICD-10-CM | POA: Diagnosis not present

## 2016-11-28 DIAGNOSIS — R262 Difficulty in walking, not elsewhere classified: Secondary | ICD-10-CM | POA: Diagnosis not present

## 2016-11-30 DIAGNOSIS — R262 Difficulty in walking, not elsewhere classified: Secondary | ICD-10-CM | POA: Diagnosis not present

## 2016-12-03 DIAGNOSIS — R262 Difficulty in walking, not elsewhere classified: Secondary | ICD-10-CM | POA: Diagnosis not present

## 2016-12-05 DIAGNOSIS — R262 Difficulty in walking, not elsewhere classified: Secondary | ICD-10-CM | POA: Diagnosis not present

## 2016-12-06 DIAGNOSIS — D471 Chronic myeloproliferative disease: Secondary | ICD-10-CM | POA: Diagnosis not present

## 2016-12-07 DIAGNOSIS — Z9889 Other specified postprocedural states: Secondary | ICD-10-CM | POA: Diagnosis not present

## 2016-12-07 DIAGNOSIS — R262 Difficulty in walking, not elsewhere classified: Secondary | ICD-10-CM | POA: Diagnosis not present

## 2016-12-10 DIAGNOSIS — R262 Difficulty in walking, not elsewhere classified: Secondary | ICD-10-CM | POA: Diagnosis not present

## 2016-12-12 DIAGNOSIS — R262 Difficulty in walking, not elsewhere classified: Secondary | ICD-10-CM | POA: Diagnosis not present

## 2016-12-13 DIAGNOSIS — N401 Enlarged prostate with lower urinary tract symptoms: Secondary | ICD-10-CM | POA: Diagnosis not present

## 2016-12-13 DIAGNOSIS — E119 Type 2 diabetes mellitus without complications: Secondary | ICD-10-CM | POA: Diagnosis not present

## 2016-12-13 DIAGNOSIS — N529 Male erectile dysfunction, unspecified: Secondary | ICD-10-CM | POA: Diagnosis not present

## 2016-12-14 DIAGNOSIS — R262 Difficulty in walking, not elsewhere classified: Secondary | ICD-10-CM | POA: Diagnosis not present

## 2016-12-17 DIAGNOSIS — R262 Difficulty in walking, not elsewhere classified: Secondary | ICD-10-CM | POA: Diagnosis not present

## 2016-12-19 DIAGNOSIS — R262 Difficulty in walking, not elsewhere classified: Secondary | ICD-10-CM | POA: Diagnosis not present

## 2016-12-21 DIAGNOSIS — R262 Difficulty in walking, not elsewhere classified: Secondary | ICD-10-CM | POA: Diagnosis not present

## 2016-12-24 DIAGNOSIS — R262 Difficulty in walking, not elsewhere classified: Secondary | ICD-10-CM | POA: Diagnosis not present

## 2016-12-25 DIAGNOSIS — R262 Difficulty in walking, not elsewhere classified: Secondary | ICD-10-CM | POA: Diagnosis not present

## 2016-12-27 DIAGNOSIS — I2721 Secondary pulmonary arterial hypertension: Secondary | ICD-10-CM | POA: Diagnosis not present

## 2016-12-27 DIAGNOSIS — I25708 Atherosclerosis of coronary artery bypass graft(s), unspecified, with other forms of angina pectoris: Secondary | ICD-10-CM | POA: Diagnosis not present

## 2016-12-27 DIAGNOSIS — R638 Other symptoms and signs concerning food and fluid intake: Secondary | ICD-10-CM | POA: Diagnosis not present

## 2016-12-28 DIAGNOSIS — R262 Difficulty in walking, not elsewhere classified: Secondary | ICD-10-CM | POA: Diagnosis not present

## 2016-12-31 DIAGNOSIS — R262 Difficulty in walking, not elsewhere classified: Secondary | ICD-10-CM | POA: Diagnosis not present

## 2017-01-02 DIAGNOSIS — R262 Difficulty in walking, not elsewhere classified: Secondary | ICD-10-CM | POA: Diagnosis not present

## 2017-01-04 DIAGNOSIS — R262 Difficulty in walking, not elsewhere classified: Secondary | ICD-10-CM | POA: Diagnosis not present

## 2017-01-07 DIAGNOSIS — R262 Difficulty in walking, not elsewhere classified: Secondary | ICD-10-CM | POA: Diagnosis not present

## 2017-01-08 DIAGNOSIS — B351 Tinea unguium: Secondary | ICD-10-CM | POA: Diagnosis not present

## 2017-01-08 DIAGNOSIS — E1151 Type 2 diabetes mellitus with diabetic peripheral angiopathy without gangrene: Secondary | ICD-10-CM | POA: Diagnosis not present

## 2017-01-08 DIAGNOSIS — L601 Onycholysis: Secondary | ICD-10-CM | POA: Diagnosis not present

## 2017-01-11 DIAGNOSIS — R262 Difficulty in walking, not elsewhere classified: Secondary | ICD-10-CM | POA: Diagnosis not present

## 2017-01-15 DIAGNOSIS — R262 Difficulty in walking, not elsewhere classified: Secondary | ICD-10-CM | POA: Diagnosis not present

## 2017-01-18 DIAGNOSIS — D471 Chronic myeloproliferative disease: Secondary | ICD-10-CM | POA: Diagnosis not present

## 2017-02-19 DIAGNOSIS — I25119 Atherosclerotic heart disease of native coronary artery with unspecified angina pectoris: Secondary | ICD-10-CM | POA: Diagnosis not present

## 2017-02-19 DIAGNOSIS — D469 Myelodysplastic syndrome, unspecified: Secondary | ICD-10-CM | POA: Diagnosis not present

## 2017-02-19 DIAGNOSIS — I25720 Atherosclerosis of autologous artery coronary artery bypass graft(s) with unstable angina pectoris: Secondary | ICD-10-CM | POA: Diagnosis not present

## 2017-02-19 DIAGNOSIS — Z8679 Personal history of other diseases of the circulatory system: Secondary | ICD-10-CM | POA: Diagnosis not present

## 2017-02-19 DIAGNOSIS — E1165 Type 2 diabetes mellitus with hyperglycemia: Secondary | ICD-10-CM | POA: Diagnosis not present

## 2017-02-19 DIAGNOSIS — F320 Major depressive disorder, single episode, mild: Secondary | ICD-10-CM | POA: Diagnosis not present

## 2017-02-19 DIAGNOSIS — D471 Chronic myeloproliferative disease: Secondary | ICD-10-CM | POA: Diagnosis not present

## 2017-02-19 DIAGNOSIS — F0150 Vascular dementia without behavioral disturbance: Secondary | ICD-10-CM | POA: Diagnosis not present

## 2017-02-19 DIAGNOSIS — Z794 Long term (current) use of insulin: Secondary | ICD-10-CM | POA: Diagnosis not present

## 2017-02-19 DIAGNOSIS — I25708 Atherosclerosis of coronary artery bypass graft(s), unspecified, with other forms of angina pectoris: Secondary | ICD-10-CM | POA: Diagnosis not present

## 2017-02-19 DIAGNOSIS — E7800 Pure hypercholesterolemia, unspecified: Secondary | ICD-10-CM | POA: Diagnosis not present

## 2017-02-19 DIAGNOSIS — I10 Essential (primary) hypertension: Secondary | ICD-10-CM | POA: Diagnosis not present

## 2017-02-19 DIAGNOSIS — I2721 Secondary pulmonary arterial hypertension: Secondary | ICD-10-CM | POA: Diagnosis not present

## 2017-03-08 DIAGNOSIS — Z9889 Other specified postprocedural states: Secondary | ICD-10-CM | POA: Diagnosis not present

## 2017-03-12 DIAGNOSIS — L601 Onycholysis: Secondary | ICD-10-CM | POA: Diagnosis not present

## 2017-03-12 DIAGNOSIS — E1151 Type 2 diabetes mellitus with diabetic peripheral angiopathy without gangrene: Secondary | ICD-10-CM | POA: Diagnosis not present

## 2017-03-12 DIAGNOSIS — B351 Tinea unguium: Secondary | ICD-10-CM | POA: Diagnosis not present

## 2017-03-15 DIAGNOSIS — D471 Chronic myeloproliferative disease: Secondary | ICD-10-CM | POA: Diagnosis not present

## 2017-03-15 DIAGNOSIS — D649 Anemia, unspecified: Secondary | ICD-10-CM | POA: Diagnosis not present

## 2017-03-27 DIAGNOSIS — T8789 Other complications of amputation stump: Secondary | ICD-10-CM | POA: Diagnosis not present

## 2017-04-16 DIAGNOSIS — E1151 Type 2 diabetes mellitus with diabetic peripheral angiopathy without gangrene: Secondary | ICD-10-CM | POA: Diagnosis not present

## 2017-04-16 DIAGNOSIS — Z89431 Acquired absence of right foot: Secondary | ICD-10-CM | POA: Diagnosis not present

## 2017-05-09 DIAGNOSIS — Z8679 Personal history of other diseases of the circulatory system: Secondary | ICD-10-CM | POA: Diagnosis not present

## 2017-05-09 DIAGNOSIS — I25708 Atherosclerosis of coronary artery bypass graft(s), unspecified, with other forms of angina pectoris: Secondary | ICD-10-CM | POA: Diagnosis not present

## 2017-05-09 DIAGNOSIS — E7800 Pure hypercholesterolemia, unspecified: Secondary | ICD-10-CM | POA: Diagnosis not present

## 2017-05-13 DIAGNOSIS — E1165 Type 2 diabetes mellitus with hyperglycemia: Secondary | ICD-10-CM | POA: Diagnosis not present

## 2017-05-13 DIAGNOSIS — M7582 Other shoulder lesions, left shoulder: Secondary | ICD-10-CM | POA: Diagnosis not present

## 2017-05-13 DIAGNOSIS — Z23 Encounter for immunization: Secondary | ICD-10-CM | POA: Diagnosis not present

## 2017-05-13 DIAGNOSIS — G40009 Localization-related (focal) (partial) idiopathic epilepsy and epileptic syndromes with seizures of localized onset, not intractable, without status epilepticus: Secondary | ICD-10-CM | POA: Diagnosis not present

## 2017-05-13 DIAGNOSIS — Z794 Long term (current) use of insulin: Secondary | ICD-10-CM | POA: Diagnosis not present

## 2017-05-16 DIAGNOSIS — Z8679 Personal history of other diseases of the circulatory system: Secondary | ICD-10-CM | POA: Diagnosis not present

## 2017-05-16 DIAGNOSIS — Z89611 Acquired absence of right leg above knee: Secondary | ICD-10-CM | POA: Diagnosis not present

## 2017-05-16 DIAGNOSIS — I25708 Atherosclerosis of coronary artery bypass graft(s), unspecified, with other forms of angina pectoris: Secondary | ICD-10-CM | POA: Diagnosis not present

## 2017-05-16 DIAGNOSIS — I779 Disorder of arteries and arterioles, unspecified: Secondary | ICD-10-CM | POA: Diagnosis not present

## 2017-05-17 DIAGNOSIS — D649 Anemia, unspecified: Secondary | ICD-10-CM | POA: Diagnosis not present

## 2017-05-17 DIAGNOSIS — D471 Chronic myeloproliferative disease: Secondary | ICD-10-CM | POA: Diagnosis not present

## 2017-05-25 DIAGNOSIS — M67922 Unspecified disorder of synovium and tendon, left upper arm: Secondary | ICD-10-CM | POA: Diagnosis not present

## 2017-06-12 DIAGNOSIS — B351 Tinea unguium: Secondary | ICD-10-CM | POA: Diagnosis not present

## 2017-06-12 DIAGNOSIS — E1151 Type 2 diabetes mellitus with diabetic peripheral angiopathy without gangrene: Secondary | ICD-10-CM | POA: Diagnosis not present

## 2017-06-12 DIAGNOSIS — L601 Onycholysis: Secondary | ICD-10-CM | POA: Diagnosis not present

## 2017-06-12 DIAGNOSIS — M75102 Unspecified rotator cuff tear or rupture of left shoulder, not specified as traumatic: Secondary | ICD-10-CM | POA: Diagnosis not present

## 2017-06-13 DIAGNOSIS — N3941 Urge incontinence: Secondary | ICD-10-CM | POA: Diagnosis not present

## 2017-06-13 DIAGNOSIS — N529 Male erectile dysfunction, unspecified: Secondary | ICD-10-CM | POA: Diagnosis not present

## 2017-06-13 DIAGNOSIS — N403 Nodular prostate with lower urinary tract symptoms: Secondary | ICD-10-CM | POA: Diagnosis not present

## 2017-06-13 DIAGNOSIS — N401 Enlarged prostate with lower urinary tract symptoms: Secondary | ICD-10-CM | POA: Diagnosis not present

## 2017-06-14 DIAGNOSIS — R509 Fever, unspecified: Secondary | ICD-10-CM | POA: Diagnosis not present

## 2017-06-14 DIAGNOSIS — E1365 Other specified diabetes mellitus with hyperglycemia: Secondary | ICD-10-CM | POA: Diagnosis not present

## 2017-06-14 DIAGNOSIS — Z794 Long term (current) use of insulin: Secondary | ICD-10-CM | POA: Diagnosis not present

## 2017-06-14 DIAGNOSIS — I10 Essential (primary) hypertension: Secondary | ICD-10-CM | POA: Diagnosis not present

## 2017-06-14 DIAGNOSIS — E860 Dehydration: Secondary | ICD-10-CM | POA: Diagnosis not present

## 2017-06-14 DIAGNOSIS — J9801 Acute bronchospasm: Secondary | ICD-10-CM | POA: Diagnosis not present

## 2017-06-14 DIAGNOSIS — Z8673 Personal history of transient ischemic attack (TIA), and cerebral infarction without residual deficits: Secondary | ICD-10-CM | POA: Diagnosis not present

## 2017-06-14 DIAGNOSIS — A419 Sepsis, unspecified organism: Secondary | ICD-10-CM | POA: Diagnosis not present

## 2017-06-20 DIAGNOSIS — Z89519 Acquired absence of unspecified leg below knee: Secondary | ICD-10-CM | POA: Diagnosis not present

## 2017-06-20 DIAGNOSIS — E1165 Type 2 diabetes mellitus with hyperglycemia: Secondary | ICD-10-CM | POA: Diagnosis not present

## 2017-06-20 DIAGNOSIS — N390 Urinary tract infection, site not specified: Secondary | ICD-10-CM | POA: Diagnosis not present

## 2017-06-20 DIAGNOSIS — Z794 Long term (current) use of insulin: Secondary | ICD-10-CM | POA: Diagnosis not present

## 2017-06-20 DIAGNOSIS — Z23 Encounter for immunization: Secondary | ICD-10-CM | POA: Diagnosis not present

## 2017-06-25 DIAGNOSIS — N529 Male erectile dysfunction, unspecified: Secondary | ICD-10-CM | POA: Diagnosis not present

## 2017-06-25 DIAGNOSIS — I739 Peripheral vascular disease, unspecified: Secondary | ICD-10-CM | POA: Diagnosis not present

## 2017-06-25 DIAGNOSIS — N401 Enlarged prostate with lower urinary tract symptoms: Secondary | ICD-10-CM | POA: Diagnosis not present

## 2017-06-25 DIAGNOSIS — N3941 Urge incontinence: Secondary | ICD-10-CM | POA: Diagnosis not present

## 2017-06-25 DIAGNOSIS — E119 Type 2 diabetes mellitus without complications: Secondary | ICD-10-CM | POA: Diagnosis not present

## 2017-06-25 DIAGNOSIS — I10 Essential (primary) hypertension: Secondary | ICD-10-CM | POA: Diagnosis not present

## 2017-06-25 DIAGNOSIS — I639 Cerebral infarction, unspecified: Secondary | ICD-10-CM | POA: Diagnosis not present

## 2017-06-28 DIAGNOSIS — N400 Enlarged prostate without lower urinary tract symptoms: Secondary | ICD-10-CM | POA: Diagnosis not present

## 2017-06-28 DIAGNOSIS — N281 Cyst of kidney, acquired: Secondary | ICD-10-CM | POA: Diagnosis not present

## 2017-06-29 DIAGNOSIS — R350 Frequency of micturition: Secondary | ICD-10-CM | POA: Diagnosis not present

## 2017-07-05 DIAGNOSIS — L03032 Cellulitis of left toe: Secondary | ICD-10-CM | POA: Diagnosis not present

## 2017-07-05 DIAGNOSIS — Z9889 Other specified postprocedural states: Secondary | ICD-10-CM | POA: Diagnosis not present

## 2017-07-09 DIAGNOSIS — I10 Essential (primary) hypertension: Secondary | ICD-10-CM | POA: Diagnosis not present

## 2017-07-09 DIAGNOSIS — R3121 Asymptomatic microscopic hematuria: Secondary | ICD-10-CM | POA: Diagnosis not present

## 2017-07-09 DIAGNOSIS — N3941 Urge incontinence: Secondary | ICD-10-CM | POA: Diagnosis not present

## 2017-07-09 DIAGNOSIS — N529 Male erectile dysfunction, unspecified: Secondary | ICD-10-CM | POA: Diagnosis not present

## 2017-07-09 DIAGNOSIS — N281 Cyst of kidney, acquired: Secondary | ICD-10-CM | POA: Diagnosis not present

## 2017-07-09 DIAGNOSIS — E119 Type 2 diabetes mellitus without complications: Secondary | ICD-10-CM | POA: Diagnosis not present

## 2017-07-09 DIAGNOSIS — N401 Enlarged prostate with lower urinary tract symptoms: Secondary | ICD-10-CM | POA: Diagnosis not present

## 2017-07-09 DIAGNOSIS — I639 Cerebral infarction, unspecified: Secondary | ICD-10-CM | POA: Diagnosis not present

## 2017-07-12 DIAGNOSIS — D471 Chronic myeloproliferative disease: Secondary | ICD-10-CM | POA: Diagnosis not present

## 2017-07-12 DIAGNOSIS — D649 Anemia, unspecified: Secondary | ICD-10-CM | POA: Diagnosis not present

## 2017-07-15 DIAGNOSIS — N401 Enlarged prostate with lower urinary tract symptoms: Secondary | ICD-10-CM | POA: Diagnosis not present

## 2017-07-15 DIAGNOSIS — I639 Cerebral infarction, unspecified: Secondary | ICD-10-CM | POA: Diagnosis not present

## 2017-07-15 DIAGNOSIS — I10 Essential (primary) hypertension: Secondary | ICD-10-CM | POA: Diagnosis not present

## 2017-07-15 DIAGNOSIS — R311 Benign essential microscopic hematuria: Secondary | ICD-10-CM | POA: Diagnosis not present

## 2017-07-16 DIAGNOSIS — I779 Disorder of arteries and arterioles, unspecified: Secondary | ICD-10-CM | POA: Diagnosis not present

## 2017-07-16 DIAGNOSIS — E119 Type 2 diabetes mellitus without complications: Secondary | ICD-10-CM | POA: Diagnosis not present

## 2017-07-16 DIAGNOSIS — I10 Essential (primary) hypertension: Secondary | ICD-10-CM | POA: Diagnosis not present

## 2017-07-16 DIAGNOSIS — I25708 Atherosclerosis of coronary artery bypass graft(s), unspecified, with other forms of angina pectoris: Secondary | ICD-10-CM | POA: Diagnosis not present

## 2017-07-23 DIAGNOSIS — E119 Type 2 diabetes mellitus without complications: Secondary | ICD-10-CM | POA: Diagnosis not present

## 2017-07-31 DIAGNOSIS — N3941 Urge incontinence: Secondary | ICD-10-CM | POA: Diagnosis not present

## 2017-07-31 DIAGNOSIS — N401 Enlarged prostate with lower urinary tract symptoms: Secondary | ICD-10-CM | POA: Diagnosis not present

## 2017-07-31 DIAGNOSIS — N529 Male erectile dysfunction, unspecified: Secondary | ICD-10-CM | POA: Diagnosis not present

## 2017-07-31 DIAGNOSIS — N403 Nodular prostate with lower urinary tract symptoms: Secondary | ICD-10-CM | POA: Diagnosis not present

## 2017-07-31 DIAGNOSIS — N281 Cyst of kidney, acquired: Secondary | ICD-10-CM | POA: Diagnosis not present

## 2017-08-01 DIAGNOSIS — I2789 Other specified pulmonary heart diseases: Secondary | ICD-10-CM | POA: Diagnosis not present

## 2017-08-01 DIAGNOSIS — I639 Cerebral infarction, unspecified: Secondary | ICD-10-CM | POA: Diagnosis not present

## 2017-08-01 DIAGNOSIS — I25708 Atherosclerosis of coronary artery bypass graft(s), unspecified, with other forms of angina pectoris: Secondary | ICD-10-CM | POA: Diagnosis not present

## 2017-08-01 DIAGNOSIS — I779 Disorder of arteries and arterioles, unspecified: Secondary | ICD-10-CM | POA: Diagnosis not present

## 2017-08-05 DIAGNOSIS — C61 Malignant neoplasm of prostate: Secondary | ICD-10-CM | POA: Diagnosis not present

## 2017-08-05 DIAGNOSIS — Z89619 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: Secondary | ICD-10-CM | POA: Diagnosis not present

## 2017-08-05 DIAGNOSIS — Z0000 Encounter for general adult medical examination without abnormal findings: Secondary | ICD-10-CM | POA: Diagnosis not present

## 2017-09-06 DIAGNOSIS — L601 Onycholysis: Secondary | ICD-10-CM | POA: Diagnosis not present

## 2017-09-06 DIAGNOSIS — E1151 Type 2 diabetes mellitus with diabetic peripheral angiopathy without gangrene: Secondary | ICD-10-CM | POA: Diagnosis not present

## 2017-09-06 DIAGNOSIS — B351 Tinea unguium: Secondary | ICD-10-CM | POA: Diagnosis not present

## 2017-09-16 DIAGNOSIS — I10 Essential (primary) hypertension: Secondary | ICD-10-CM | POA: Diagnosis not present

## 2017-09-16 DIAGNOSIS — Z8679 Personal history of other diseases of the circulatory system: Secondary | ICD-10-CM | POA: Diagnosis not present

## 2017-09-16 DIAGNOSIS — I779 Disorder of arteries and arterioles, unspecified: Secondary | ICD-10-CM | POA: Diagnosis not present

## 2017-09-16 DIAGNOSIS — I25708 Atherosclerosis of coronary artery bypass graft(s), unspecified, with other forms of angina pectoris: Secondary | ICD-10-CM | POA: Diagnosis not present

## 2020-09-02 ENCOUNTER — Telehealth: Payer: Self-pay | Admitting: Oncology

## 2020-09-02 NOTE — Telephone Encounter (Signed)
Scheduled appointment per new patient referral. Spoke to patient who is aware of appointment date and time.  

## 2020-09-09 ENCOUNTER — Telehealth: Payer: Self-pay | Admitting: Hematology

## 2020-09-09 NOTE — Telephone Encounter (Signed)
Mr. Kiester has been cld and rescheduled to see Dr. Burr Medico on 12/9 at 1pm.

## 2020-09-12 ENCOUNTER — Other Ambulatory Visit: Payer: Self-pay

## 2020-09-12 ENCOUNTER — Encounter: Payer: Self-pay | Admitting: Physician Assistant

## 2020-09-12 ENCOUNTER — Ambulatory Visit (INDEPENDENT_AMBULATORY_CARE_PROVIDER_SITE_OTHER): Payer: Medicare Other | Admitting: Orthopedic Surgery

## 2020-09-12 DIAGNOSIS — S78111A Complete traumatic amputation at level between right hip and knee, initial encounter: Secondary | ICD-10-CM

## 2020-09-12 DIAGNOSIS — Z89611 Acquired absence of right leg above knee: Secondary | ICD-10-CM

## 2020-09-12 NOTE — Progress Notes (Signed)
[place]   Telephone:([phone]) [phone] Fax:([phone]) [place] Note   Patient Care Team: Tyler Dials, [place] as PCP - General (Family Medicine) Tyler Pickett Nadean Corwin, [place] as Consulting Physician (Cardiology) Tyler Breeding, [place] as Consulting Physician (Cardiology) Tyler Sprang, [place] as Consulting Physician (Neurology)  Date of Service:  09/12/2020   CHIEF COMPLAINTS/PURPOSE OF CONSULTATION:  Myelofibrosis, re-establish oncology care   REFERRING PHYSICIAN:  Dr. Wenda Whitehead, PCP  HISTORY OF PRESENTING ILLNESS: [place] 79 y.o. male is a here because of Myelofibrosis. The patient was referred by his PCP, Dr [place]. The patient presents to the clinic today by himself. He was previously under my care until 2017 when he moved to [place].  He has recently moved back to [place], and would like to reestablish his hematology care in our office.  He was diagnosed with myeloproliferative neoplasm/myelofibrosis in 2014, and has been on Hydrea since then.  His disease has been well controlled overall, and he has been tolerating treatment well.  He was felt not a good candidate for stem cell transplant due to his advanced age and multiple medical comorbidities.  He previously tried [place] for 4 months in 2014, but could not tolerate due to his anemia and fatigue.  He was on Hydrea 1500 mg morning and 1000 mg p.m in 2017 when he moved to [place].  He has been under [place] care in [place], and his Hydrea dose has been adjusted as needed.  He is currently on Hydrea 1000 mg in the morning, and 500 mg in the evening.  He had a CBC with his primary care physician last month, platelet count was 707K.  He lives alone now.  His daughter still lives in [place].  He has 2 brothers in [place].  He is able to live independently.  Due to diabetes, he underwent right above-knee amputation a few year ago, he has a prosthesis.  He walks with a walker.   REVIEW OF  SYSTEMS:   Constitutional: Denies fevers, chills or abnormal night sweats, no recent weight loss.  Mild fatigue. Eyes: Denies blurriness of vision, double vision or watery eyes Ears, nose, mouth, throat, and face: Denies mucositis or sore throat Respiratory: Denies cough, dyspnea or wheezes Cardiovascular: Denies palpitation, chest discomfort or lower extremity swelling Gastrointestinal:  Denies nausea, heartburn or change in bowel habits Skin: Denies abnormal skin rashes Lymphatics: Denies new lymphadenopathy or easy bruising Neurological:Denies numbness, tingling or new weaknesses Behavioral/Psych: Mood is stable, no new changes  All other systems were reviewed with the patient and are negative.   MEDICAL HISTORY:  Past Medical History:  Diagnosis Date  . CAD (coronary artery disease)    a. S/p 3V CABG 2000 with LIMA-LAD, left radial-PDA, SVG-OM1. b. 2014: stent to SVG-OM1; then [place] to have obstruction distal to LAD anastamosis of LIMA s/p DES.   . Diverticular disease   . DM (diabetes mellitus) ([place])   . Hemorrhoid   . HTN (hypertension)   . Hyperlipidemia   . Peripheral vascular disease ([place])    a. R femoropopliteal bypass ~2012 in [place], reportedly shown to be occluded. Now followed by Dr. Gwenlyn Whitehead.  . 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[place])   . RBBB     SURGICAL HISTORY: Past Surgical History:  Procedure Laterality Date  . ABDOMINAL ANGIOGRAM N/A 01/18/2015   Procedure: ABDOMINAL ANGIOGRAM;  Surgeon: Tyler Mitchell, [place];  Location[facility] LAB;  Service: Cardiovascular;  Laterality: N/A;  . ABDOMINAL  AORTAGRAM N/A 09/21/2014   Procedure: ABDOMINAL [place];  Surgeon: Tyler Dutch, [place];  Location: [facility] CATH LAB;  Service: Cardiovascular;  Laterality: N/A;  . CARDIAC CATHETERIZATION  May 2014  . CORONARY ARTERY BYPASS GRAFT  2000   LM occluded, LIMA to LAD patent but distal native LAD disease, saphenous vein bypass graft to OM with 95% stenosis in the midportion, right coronary artery  occluded, radial artery graft to right coronary artery patent but was diffusely small. Marginal graft stented. 7 2014. Of note he had multiple previous stents in the right coronary artery. The distal LAD was treated with angioplasty. I  . LOOP RECORDER IMPLANT N/A 07/12/2014   Procedure: LOOP RECORDER IMPLANT;  Surgeon: Tyler Lance, [place];  Location: [facility] CATH LAB;  Service: Cardiovascular;  Laterality: N/A;  . LOWER EXTREMITY ANGIOGRAM  09/21/2014   Procedure: LOWER EXTREMITY ANGIOGRAM;  Surgeon: Tyler Dutch, [place];  Location: [facility] CATH LAB;  Service: Cardiovascular;;  . PERCUTANEOUS CORONARY STENT INTERVENTION (PCI-S)  April 27 2013  . TEE WITHOUT CARDIOVERSION N/A 07/12/2014   Procedure: TRANSESOPHAGEAL ECHOCARDIOGRAM (TEE);  Surgeon: Tyler Records, Tyler Whitehead;  Location: [facility] ENDOSCOPY;  Service: Cardiovascular;  Laterality: N/A;  . VEIN SURGERY      SOCIAL HISTORY: Social History   Socioeconomic History  . Marital status: Married    Spouse name: Not on file  . Number of children: Not on file  . Years of education: Not on file  . Highest education level: Not on file  Occupational History  . Occupation: Limo driver  Tobacco Use  . Smoking status: Never Smoker  . Smokeless tobacco: Never Used  Substance and Sexual Activity  . Alcohol use: No    Alcohol/week: 0.0 standard drinks  . Drug use: No  . Sexual activity: Not on file  Other Topics Concern  . Not on file  Social History Narrative   Lives with wife.    Social Determinants of Health   Financial Resource Strain:   . Difficulty of Paying Living Expenses: Not on file  Food Insecurity:   . Worried About Charity fundraiser in the Last Year: Not on file  . Ran Out of Food in the Last Year: Not on file  Transportation Needs:   . Lack of Transportation (Medical): Not on file  . Lack of Transportation (Non-Medical): Not on file  Physical Activity:   . Days of Exercise per Week: Not on file  . Minutes of Exercise per Session: Not on file   Stress:   . Feeling of Stress : Not on file  Social Connections:   . Frequency of Communication with Friends and Family: Not on file  . Frequency of Social Gatherings with Friends and Family: Not on file  . Attends Religious Services: Not on file  . Active Member of Clubs or Organizations: Not on file  . Attends Archivist Meetings: Not on file  . Marital Status: Not on file  Intimate Partner Violence:   . Fear of Current or Ex-Partner: Not on file  . Emotionally Abused: Not on file  . Physically Abused: Not on file  . Sexually Abused: Not on file    FAMILY HISTORY: Family History  Problem Relation Age of Onset  . Hyperlipidemia Maternal Grandmother   . Asthma Father   . Heart disease Father   . CAD Unknown        Multiple siblings    ALLERGIES:  has No Known Allergies.  MEDICATIONS:  Current Outpatient  Medications  Medication Sig Dispense Refill  . aspirin EC 81 MG tablet Take 81 mg by mouth daily.    Marland Kitchen atorvastatin (LIPITOR) 20 MG tablet Take 60 mg by mouth daily. (Takes along with a 40 mg tablet daily to equal 60 mg)    . atorvastatin (LIPITOR) 40 MG tablet as directed.    . carvedilol (COREG) 6.25 MG tablet Take 1 tablet (6.25 mg total) by mouth 2 (two) times daily with a meal. 180 tablet 0  . cholecalciferol (VITAMIN D) 1000 UNITS tablet Take 1,000 Units by mouth daily.    . fluticasone (FLONASE) 50 MCG/ACT nasal spray Place 2 sprays into both nostrils as directed.    . hydroxyurea (HYDREA) 500 MG capsule Take 3 cap in morning and 2 cap after dinner. May take with food to minimize GI side effects. 150 capsule 0  . insulin glargine (LANTUS) 100 UNIT/ML injection Inject 22 Units into the skin at bedtime.    . insulin lispro (HUMALOG) 100 UNIT/ML injection Inject 4 Units into the skin 3 (three) times daily before meals.     . isosorbide mononitrate (IMDUR) 60 MG 24 hr tablet Take 1-2 tablets (60-120 mg total) by mouth 2 (two) times daily. 2 in the morning and 1  in the evening 360 tablet 0  . levETIRAcetam (KEPPRA) 500 MG tablet Take 1 and 1/2 tablets by mouth twice daily 90 tablet 11  . Linagliptin-Metformin HCl 2.5-500 MG TABS Take 1 tablet by mouth 2 (two) times daily.    . metoCLOPramide (REGLAN) 10 MG tablet 10 mg. Take 1 tablet 3 times a day    . nitroGLYCERIN (NITROSTAT) 0.4 MG SL tablet Place 1 tablet (0.4 mg total) under the tongue every 5 (five) minutes as needed for chest pain. 25 tablet 12  . PROAIR HFA 108 (90 BASE) MCG/ACT inhaler Inhale 1 puff into the lungs every 4 (four) hours as needed for wheezing or shortness of breath.     . rivaroxaban (XARELTO) 20 MG TABS tablet Take 1 tablet (20 mg total) by mouth daily with supper. 30 tablet 11   No current facility-administered medications for this visit.    PHYSICAL EXAMINATION: ECOG PERFORMANCE STATUS: 3 - Symptomatic, >50% confined to bed  Vitals:   09/15/20 1313  BP: 128/69  Pulse: 92  Resp: 17  Temp: 97.6 F (36.4 C)  SpO2: 100%   Filed Weights   09/15/20 1313  Weight: 161 lb 12.8 oz (73.4 kg)    GENERAL:alert, no distress and comfortable SKIN: skin color, texture, turgor are normal, no rashes or significant lesions, except significant dog in his on his palms and hands EYES: normal, Conjunctiva are pink and non-injected, sclera clear NECK: supple, thyroid normal size, non-tender, without nodularity LYMPH:  no palpable lymphadenopathy in the cervical, axillary  LUNGS: clear to auscultation and percussion with normal breathing effort HEART: regular rate & rhythm and no murmurs and no lower extremity edema ABDOMEN:abdomen soft, non-tender and normal bowel sounds Musculoskeletal:no cyanosis of digits and no clubbing, s/p right above-knee amputation with a prosthesis. NEURO: alert & oriented x 3 with fluent speech, no focal motor/sensory deficits  LABORATORY DATA:  I have reviewed the data as listed CBC Latest Ref Rng & Units 09/15/2020 12/02/2015 11/24/2015  WBC 4.0 - 10.5  K/uL 19.7(H) 10.7(H) 22.7(H)  Hemoglobin 13.0 - 17.0 g/dL 11.4(L) 11.5(L) 11.8(L)  Hematocrit 39.0 - 52.0 % 35.7(L) 38.8 39.8  Platelets 150 - 400 K/uL 614(H) 788(H) 849(H)    CMP Latest Ref  Rng & Units 09/15/2020 10/28/2015 07/28/2015  Glucose 70 - 99 mg/dL 149(H) 196(H) 128  BUN 8 - 23 mg/dL 14 12.9 10.3  Creatinine 0.61 - 1.24 mg/dL 1.16 1.1 1.0  Sodium 135 - 145 mmol/L 144 142 143  Potassium 3.5 - 5.1 mmol/L 5.3(H) 4.1 3.9  Chloride 98 - 111 mmol/L 108 - -  CO2 22 - 32 mmol/L 30 29 27   Calcium 8.9 - 10.3 mg/dL 9.4 9.2 9.1  Total Protein 6.5 - 8.1 g/dL 6.3(L) 6.6 6.0(L)  Total Bilirubin 0.3 - 1.2 mg/dL 0.5 0.46 0.44  Alkaline Phos 38 - 126 U/L 106 110 101  AST 15 - 41 U/L 15 18 12   ALT 0 - 44 U/L 10 18 <9    RADIOGRAPHIC STUDIES: I have personally reviewed the radiological images as listed and agreed with the findings in the report. No results White[place].  ASSESSMENT & PLAN:  NYZIER BOIVIN is a 79 y.o. African American male with a history of CAD, DM, HTN, HLD, peripheral vascular disease   1. 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 -We discussed that this is not curable disease, but treatable. Some patient will develop acute leukemia or aplastic anemia later on. -he has intermediate risk disease, which predicts median survival 6.5 years. -he is not a good candidate for stem cell transplant due to his advanced age and multiple medical comorbidities. -I discussed that he is at high risk for thrombosis secondary to his disease, he will continue aspirin and plavix  -he tried [place] for 4 months in 2014, could not tolerate due to his anemia and fatigue. -We again emphasized the importance of continued Hydrea and normalized his blood counts to prevent further stroke or thrombosis. He agrees to continue Hydrea and compliant with treatm -He recently moved back from [place] to [place].  He is  currently on Hydrea 1000 mg in the morning, and 500 milligrams in the evening.  His platelet counts is still high, I will increase evening dose to 1000mg  MWF and continue at 500mg  for the rest of week -lab monthly  -f/u in 3 months   2. DM, s/p right AKA -He has a right leg prosthesis, uses a walker, he lives independently. -Follow-up with Dr. Deforest Whitehead  3. CADz s/p CABG x 3, hypertension  --Continue lisinopril, coreg, aspirin and plavix  -He will follow up with his cardiologist  4. PvDz.  --Continue Imdur as it appearing to have improved some of his claudication symptoms.   5. Dyslipidemia.  --Continue statin therapy.   6. Stroke and seizure in 07/2014 -- follow up with his neurologist   Plan; -continue Hydrea, 1000mg  in am, and increase evening dose from 500mg  daily to 1000mg  pm on MWF and continue 500mg  for the rest of week -Lab monthly -Follow-up in 3 months -will get his medical [place] from Dr. Mina [place] in [place]       No orders of the defined types were placed in this encounter.   All questions were answered. The patient knows to call the clinic with any problems, questions or concerns. The total time spent in the appointment was 40 minutes.     Tyler Merle, Tyler Whitehead 09/15/2020  I, Tyler Whitehead, am acting as scribe for Tyler Merle, Tyler Whitehead.   I have reviewed the above documentation for accuracy and completeness, and I agree with the above.

## 2020-09-12 NOTE — Progress Notes (Signed)
Office Visit Note   Patient: Tyler Whitehead           Date of Birth: Jul 09, 1941           MRN: [id number] Visit Date: 09/12/2020              Requested by: Aura Dials, [place],  [place] [address] PCP: Aura Dials, MD  Chief Complaint  Patient presents with  . Right Knee - Pain      HPI: Patient is a 79 year old gentleman who is seen for initial evaluation for a poor fitting right above-the-knee amputation prosthesis.  Patient has peripheral vascular disease he is status post revascularization to the left lower extremity with stenting patient states that due to his vascular disease he required an above-the-knee amputation on the right.  Patient was previously living in [place] where his prosthesis was made.  Patient states he is unable to ambulate he and bears in the socket.  Assessment & Plan: Visit Diagnoses:  1. Unilateral AKA, right ([place])     Plan: Patient was given a prescription for biotech for a new liner new socket evaluation for new knee foot and ankle. Patient is a K2 level ambulator  Follow-Up Instructions: Return if symptoms worsen or fail to improve.   Ortho Exam  Patient is alert, oriented, no adenopathy, well-dressed, normal affect, normal respiratory effort. Examination patient has a short residual limb on the right.  There are no open ulcers he has a loose poorly fitting liner the socket is too large and he slides down into the end of the socket.  The prosthesis is nonfunctional for him.  Imaging: No results found. No images are attached to the encounter.  Labs: Lab Results  Component Value Date   HGBA1C 12.9 (H) 07/10/2014   ESRSEDRATE 1 07/05/2014   CRP <0.5 (L) 07/05/2014     Lab Results  Component Value Date   ALBUMIN 3.7 10/28/2015   ALBUMIN 3.4 (L) 07/28/2015   ALBUMIN 3.5 12/22/2014    No results found for: MG No results found for: VD25OH  No results found for: PREALBUMIN CBC EXTENDED Latest Ref Rng & Units  12/02/2015 11/24/2015 11/11/2015  WBC 4.0 - 10.3 10e3/uL 10.7(H) 22.7(H) 7.4  RBC 4.20 - 5.82 10e6/uL 5.20 5.37 5.77  HGB 13.0 - 17.1 g/dL 11.5(L) 11.8(L) 12.0(L)  HCT 38 - 49 % 38.8 39.8 41.7  PLT 140 - 400 10e3/uL 788(H) 849(H) 228 Large platelets present  NEUTROABS 1.5 - 6.5 10e3/uL 8.8(H) 18.4(H) 5.7  LYMPHSABS 0.9 - 3.3 10e3/uL 1.1 2.0 1.0     There is no height or weight on file to calculate BMI.  Orders:  No orders of the defined types were placed in this encounter.  No orders of the defined types were placed in this encounter.    Procedures: No procedures performed  Clinical Data: No additional findings.  ROS:  All other systems negative, except as noted in the HPI. Review of Systems  Objective: Vital Signs: There were no vitals taken for this visit.  Specialty Comments:  No specialty comments available.  PMFS History: Patient Active Problem List   Diagnosis Date Noted  . Cryptogenic stroke ([place]) 07/20/2015  . History of stroke 07/06/2015  . Transient vision disturbance, right 07/06/2015  . Atherosclerosis of native arteries of extremity with intermittent claudication ([place]) 09/27/2014  . Localization-related symptomatic epilepsy and epileptic syndromes with complex partial seizures, not intractable, without status epilepticus ([place]) 08/04/2014  . Occipital  stroke ([place]) 08/04/2014  . Cerebral thrombosis with cerebral infarction ([place]) 07/10/2014  . New onset seizure ([place]) 07/09/2014  . Diabetic hyperosmolar non-ketotic state ([place]) 07/09/2014  . Uncontrolled hypertension 07/09/2014  . Polycythemia vera ([place]) 07/09/2014  . Seizure ([place]) 07/09/2014  . Visual field defect 07/09/2014  . Headache 06/30/2014  . HTN (hypertension) 11/23/2013  . Dyslipidemia 11/23/2013  . Myelofibrosis ([place]) 11/22/2013  . Diverticular disease 11/22/2013  . Hemorrhoids 11/22/2013  . 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 CAD (coronary artery disease) 11/22/2013  . Peripheral vascular disease  ([place]) 11/22/2013  . DM2 (diabetes mellitus, type 2) ([place]) 11/22/2013  . Renal cyst, left 11/22/2013  . Splenomegaly 11/22/2013   Past Medical History:  Diagnosis Date  . CAD (coronary artery disease)    a. S/p 3V CABG 2000 with LIMA-LAD, left radial-PDA, SVG-OM1. b. 2014: stent to SVG-OM1; then found to have obstruction distal to LAD anastamosis of LIMA s/p DES.   . Diverticular disease   . DM (diabetes mellitus) ([place])   . Hemorrhoid   . HTN (hypertension)   . Hyperlipidemia   . Peripheral vascular disease ([place])    a. R femoropopliteal bypass ~2012 in [place], reportedly shown to be occluded. Now followed by Dr. [name].  . 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[place])   . RBBB     Family History  Problem Relation Age of Onset  . Hyperlipidemia Maternal Grandmother   . Asthma Father   . Heart disease Father   . CAD Unknown        Multiple siblings    Past Surgical History:  Procedure Laterality Date  . ABDOMINAL ANGIOGRAM N/A 01/18/2015   Procedure: ABDOMINAL ANGIOGRAM;  Surgeon: Serafina Mitchell, MD;  Location: [facility] CATH LAB;  Service: Cardiovascular;  Laterality: N/A;  . ABDOMINAL AORTAGRAM N/A 09/21/2014   Procedure: ABDOMINAL Maxcine Ham;  Surgeon: Elam Dutch, MD;  Location: [facility] CATH LAB;  Service: Cardiovascular;  Laterality: N/A;  . CARDIAC CATHETERIZATION  May 2014  . CORONARY ARTERY BYPASS GRAFT  2000   LM occluded, LIMA to LAD patent but distal native LAD disease, saphenous vein bypass graft to OM with 95% stenosis in the midportion, right coronary artery occluded, radial artery graft to right coronary artery patent but was diffusely small. Marginal graft stented. 7 2014. Of note he had multiple previous stents in the right coronary artery. The distal LAD was treated with angioplasty. I  . LOOP RECORDER IMPLANT N/A 07/12/2014   Procedure: LOOP RECORDER IMPLANT;  Surgeon: Evans Lance, MD;  Location: [facility] CATH LAB;  Service: Cardiovascular;  Laterality: N/A;  . LOWER EXTREMITY ANGIOGRAM   09/21/2014   Procedure: LOWER EXTREMITY ANGIOGRAM;  Surgeon: Elam Dutch, MD;  Location: [facility] CATH LAB;  Service: Cardiovascular;;  . PERCUTANEOUS CORONARY STENT INTERVENTION (PCI-S)  April 27 2013  . TEE WITHOUT CARDIOVERSION N/A 07/12/2014   Procedure: TRANSESOPHAGEAL ECHOCARDIOGRAM (TEE);  Surgeon: Fay Records, MD;  Location: [facility] ENDOSCOPY;  Service: Cardiovascular;  Laterality: N/A;  . VEIN SURGERY     Social History   Occupational History  . Occupation: Limo driver  Tobacco Use  . Smoking status: Never Smoker  . Smokeless tobacco: Never Used  Substance and Sexual Activity  . Alcohol use: No    Alcohol/week: 0.0 standard drinks  . Drug use: No  . Sexual activity: Not on file

## 2020-09-15 ENCOUNTER — Encounter: Payer: Self-pay | Admitting: Hematology

## 2020-09-15 ENCOUNTER — Inpatient Hospital Stay: Payer: Medicare Other

## 2020-09-15 ENCOUNTER — Encounter: Payer: Medicare Other | Admitting: Oncology

## 2020-09-15 ENCOUNTER — Other Ambulatory Visit: Payer: Self-pay

## 2020-09-15 ENCOUNTER — Inpatient Hospital Stay: Payer: Medicare Other | Attending: Oncology | Admitting: Hematology

## 2020-09-15 ENCOUNTER — Telehealth: Payer: Self-pay | Admitting: Hematology

## 2020-09-15 VITALS — BP 128/69 | HR 92 | Temp 97.6°F | Resp 17 | Ht 67.0 in | Wt 161.8 lb

## 2020-09-15 DIAGNOSIS — Z89611 Acquired absence of right leg above knee: Secondary | ICD-10-CM

## 2020-09-15 DIAGNOSIS — D7581 Myelofibrosis: Secondary | ICD-10-CM | POA: Diagnosis not present

## 2020-09-15 DIAGNOSIS — Z7901 Long term (current) use of anticoagulants: Secondary | ICD-10-CM

## 2020-09-15 DIAGNOSIS — Z8673 Personal history of transient ischemic attack (TIA), and cerebral infarction without residual deficits: Secondary | ICD-10-CM | POA: Diagnosis not present

## 2020-09-15 DIAGNOSIS — I2510 Atherosclerotic heart disease of native coronary artery without angina pectoris: Secondary | ICD-10-CM | POA: Diagnosis not present

## 2020-09-15 DIAGNOSIS — I10 Essential (primary) hypertension: Secondary | ICD-10-CM | POA: Diagnosis not present

## 2020-09-15 DIAGNOSIS — E1151 Type 2 diabetes mellitus with diabetic peripheral angiopathy without gangrene: Secondary | ICD-10-CM | POA: Diagnosis not present

## 2020-09-15 DIAGNOSIS — Z79899 Other long term (current) drug therapy: Secondary | ICD-10-CM | POA: Diagnosis not present

## 2020-09-15 DIAGNOSIS — D471 Chronic myeloproliferative disease: Secondary | ICD-10-CM | POA: Diagnosis not present

## 2020-09-15 DIAGNOSIS — Z794 Long term (current) use of insulin: Secondary | ICD-10-CM | POA: Diagnosis not present

## 2020-09-15 LAB — CMP (CANCER CENTER ONLY)
ALT: 10 U/L (ref 0–44)
AST: 15 U/L (ref 15–41)
Albumin: 3.6 g/dL (ref 3.5–5.0)
Alkaline Phosphatase: 106 U/L (ref 38–126)
Anion gap: 6 (ref 5–15)
BUN: 14 mg/dL (ref 8–23)
CO2: 30 mmol/L (ref 22–32)
Calcium: 9.4 mg/dL (ref 8.9–10.3)
Chloride: 108 mmol/L (ref 98–111)
Creatinine: 1.16 mg/dL (ref 0.61–1.24)
GFR, Estimated: >60 mL/min (ref >60)
Glucose, Bld: 149 mg/dL — ABNORMAL HIGH (ref 70–99)
Potassium: 5.3 mmol/L — ABNORMAL HIGH (ref 3.5–5.1)
Sodium: 144 mmol/L (ref 135–145)
Total Bilirubin: 0.5 mg/dL (ref 0.3–1.2)
Total Protein: 6.3 g/dL — ABNORMAL LOW (ref 6.5–8.1)

## 2020-09-15 LAB — CBC WITH DIFFERENTIAL (CANCER CENTER ONLY)
Abs Immature Granulocytes: 0.16 10*3/uL — ABNORMAL HIGH (ref 0.00–0.07)
Basophils Absolute: 0.1 10*3/uL (ref 0.0–0.1)
Basophils Relative: 1 %
Eosinophils Absolute: 0.3 10*3/uL (ref 0.0–0.5)
Eosinophils Relative: 2 %
HCT: 35.7 % — ABNORMAL LOW (ref 39.0–52.0)
Hemoglobin: 11.4 g/dL — ABNORMAL LOW (ref 13.0–17.0)
Immature Granulocytes: 1 %
Lymphocytes Relative: 6 %
Lymphs Abs: 1.2 10*3/uL (ref 0.7–4.0)
MCH: 37 pg — ABNORMAL HIGH (ref 26.0–34.0)
MCHC: 31.9 g/dL (ref 30.0–36.0)
MCV: 115.9 fL — ABNORMAL HIGH (ref 80.0–100.0)
Monocytes Absolute: 0.7 10*3/uL (ref 0.1–1.0)
Monocytes Relative: 4 %
Neutro Abs: 17.1 10*3/uL — ABNORMAL HIGH (ref 1.7–7.7)
Neutrophils Relative %: 86 %
Platelet Count: 614 10*3/uL — ABNORMAL HIGH (ref 150–400)
RBC: 3.08 MIL/uL — ABNORMAL LOW (ref 4.22–5.81)
RDW: 17.9 % — ABNORMAL HIGH (ref 11.5–15.5)
WBC Count: 19.7 10*3/uL — ABNORMAL HIGH (ref 4.0–10.5)
nRBC: 0.1 % (ref 0.0–0.2)

## 2020-09-15 NOTE — Telephone Encounter (Signed)
Scheduled per 12/09 los, patient has received updated calender.

## 2020-09-16 ENCOUNTER — Encounter: Payer: Self-pay | Admitting: Hematology

## 2020-09-16 ENCOUNTER — Other Ambulatory Visit: Payer: Self-pay

## 2020-09-16 DIAGNOSIS — R21 Rash and other nonspecific skin eruption: Secondary | ICD-10-CM

## 2020-09-16 MED ORDER — TRIAMCINOLONE ACETONIDE 0.1 % EX OINT: 1.0000 "application " | TOPICAL_OINTMENT | Freq: Three times a day (TID) | CUTANEOUS | 1 refills | Status: DC

## 2020-09-21 NOTE — Progress Notes (Signed)
Cardiology Office Note   Date:  09/23/2020   ID:  Tyler, Whitehead 18-Oct-1940, MRN [id number]  PCP:  Wenda Low, MD  Cardiologist:   No primary care provider on file. Referring:  Wenda Low, MD  Chief Complaint  Patient presents with  . Coronary Artery Disease  . Shortness of Breath      History of Present Illness: Tyler Whitehead is a 79 y.o. male who presents for follow up of CAD/CABG.  I saw him last in 2017.  He moved to [place] and is moving back now.  He has CAD he has had CABG and PVD. His last cath in [place], [place] suggested that "Medical therapy should be enhanced if the patient has ongoing exertional chest discomfort despite maximal medical management and if he has evidence of inferior ischemia on a perfusion study then repeat PCI of the native right coronary artery could be considered. However, given the patient's track record the right coronary artery is not likely to remain open even if PCI could be accomplished."  He is also managed for lower extremity disease as seen by Dr. Trula Slade.  He has had a CVA of unclear etiology had an implantable loop by Dr. Lovena Le.  He has now moved back here.  While he was there he says he had 1 more catheterization for episodes of shortness of breath and had to manage him medically for a blockage they could not fix.  He apparently had revascularization has scars on his left leg so had peripheral vascular surgery.  He said he had a failed revascularization on his right leg and is now status post AKA.  He is moved back here and he gets around with a walker and his prosthesis.  He does not have chest pressure, neck or arm discomfort.  He is rarely having episodes of shortness of breath which seem to happen with activities.  He might take a nitroglycerin about 5-6 times per month which is a stable pattern.  He has never really had substernal chest pain.  He has no PND or orthopnea.  Has had no weight gain or edema.  Has had no  palpitations, presyncope or syncope.   Past Medical History:  Diagnosis Date  . CAD (coronary artery disease)    a. S/p 3V CABG 2000 with LIMA-LAD, left radial-PDA, SVG-OM1. b. 2014: stent to SVG-OM1; then found to have obstruction distal to LAD anastamosis of LIMA s/p DES.   . CHF (congestive heart failure) ([place])   . Diverticular disease   . DM (diabetes mellitus) ([place])   . Hemorrhoid   . HTN (hypertension)   . Hyperlipidemia   . Peripheral vascular disease ([place])    a. R femoropopliteal bypass ~2012 in [place], reportedly shown to be occluded. Now followed by Dr. [name].  . 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[place])   . RBBB     Past Surgical History:  Procedure Laterality Date  . ABDOMINAL ANGIOGRAM N/A 01/18/2015   Procedure: ABDOMINAL ANGIOGRAM;  Surgeon: Serafina Mitchell, MD;  Location: [facility] CATH LAB;  Service: Cardiovascular;  Laterality: N/A;  . ABDOMINAL AORTAGRAM N/A 09/21/2014   Procedure: ABDOMINAL Maxcine Ham;  Surgeon: Elam Dutch, MD;  Location: [facility] CATH LAB;  Service: Cardiovascular;  Laterality: N/A;  . CARDIAC CATHETERIZATION  May 2014  . CORONARY ARTERY BYPASS GRAFT  2000   LM occluded, LIMA to LAD patent but distal native LAD disease, saphenous vein bypass graft to OM with 95% stenosis in the midportion, right coronary artery occluded,  radial artery graft to right coronary artery patent but was diffusely small. Marginal graft stented. 7 2014. Of note he had multiple previous stents in the right coronary artery. The distal LAD was treated with angioplasty. I  . LOOP RECORDER IMPLANT N/A 07/12/2014   Procedure: LOOP RECORDER IMPLANT;  Surgeon: Evans Lance, MD;  Location: [facility] CATH LAB;  Service: Cardiovascular;  Laterality: N/A;  . LOWER EXTREMITY ANGIOGRAM  09/21/2014   Procedure: LOWER EXTREMITY ANGIOGRAM;  Surgeon: Elam Dutch, MD;  Location: [facility] CATH LAB;  Service: Cardiovascular;;  . PERCUTANEOUS CORONARY STENT INTERVENTION (PCI-S)  April 27 2013  . right leg amputation  2017  .  TEE WITHOUT CARDIOVERSION N/A 07/12/2014   Procedure: TRANSESOPHAGEAL ECHOCARDIOGRAM (TEE);  Surgeon: Fay Records, MD;  Location: [facility] ENDOSCOPY;  Service: Cardiovascular;  Laterality: N/A;  . VEIN SURGERY       Current Outpatient Medications  Medication Sig Dispense Refill  . aspirin EC 81 MG tablet Take 81 mg by mouth daily.    Marland Kitchen atorvastatin (LIPITOR) 20 MG tablet Take 3 tablets (60 mg total) by mouth daily. (Takes along with a 40 mg tablet daily to equal 60 mg) 270 tablet 3  . carvedilol (COREG) 6.25 MG tablet Take 1 tablet (6.25 mg total) by mouth 2 (two) times daily with a meal. 180 tablet 3  . cholecalciferol (VITAMIN D) 1000 UNITS tablet Take 1,000 Units by mouth daily.    . fluticasone (FLONASE) 50 MCG/ACT nasal spray Place 2 sprays into both nostrils as directed.    . hydroxyurea (HYDREA) 500 MG capsule Take 3 cap in morning and 2 cap after dinner. May take with food to minimize GI side effects. 150 capsule 0  . insulin glargine (LANTUS) 100 UNIT/ML injection Inject 22 Units into the skin at bedtime.    . insulin lispro (HUMALOG) 100 UNIT/ML injection Inject 4 Units into the skin 3 (three) times daily before meals.     . isosorbide mononitrate (IMDUR) 60 MG 24 hr tablet Take 1-2 tablets (60-120 mg total) by mouth 2 (two) times daily. 2 in the morning and 1 in the evening 270 tablet 3  . Linagliptin-Metformin HCl 2.5-500 MG TABS Take 1 tablet by mouth 2 (two) times daily.    . nitroGLYCERIN (NITROSTAT) 0.4 MG SL tablet Place 1 tablet (0.4 mg total) under the tongue every 5 (five) minutes as needed for chest pain. 25 tablet 12  . PROAIR HFA 108 (90 BASE) MCG/ACT inhaler Inhale 1 puff into the lungs every 4 (four) hours as needed for wheezing or shortness of breath.     . triamcinolone ointment (KENALOG) 0.1 % Apply 1 application topically 3 (three) times daily. 30 g 1   No current facility-administered medications for this visit.    Allergies:   Patient has no known allergies.     Social History:  The patient  reports that he has never smoked. He has never used smokeless tobacco. He reports that he does not drink alcohol and does not use drugs.   Family History:  The patient's family history includes Asthma in his father; CAD in an other family member; Cancer in his paternal uncle and another family member; Heart disease in his father; Hyperlipidemia in his maternal grandmother.    ROS:  Please see the history of present illness.   Otherwise, review of systems are positive for none.   All other systems are reviewed and negative.    PHYSICAL EXAM: VS:  BP 130/83  Pulse 91   Temp (!) 97.5 F (36.4 C)   Ht 5\' 7"  (1.702 m)   Wt 164 lb 6.4 oz (74.6 kg)   SpO2 99%   BMI 25.75 kg/m  , BMI Body mass index is 25.75 kg/m. GENERAL:  Well appearing HEENT:  Pupils equal round and reactive, fundi not visualized, oral mucosa unremarkable NECK:  No jugular venous distention, waveform within normal limits, carotid upstroke brisk and symmetric, no bruits, no thyromegaly LYMPHATICS:  No cervical, inguinal adenopathy LUNGS:  Clear to auscultation bilaterally BACK:  No CVA tenderness CHEST:  Unremarkable HEART:  PMI not displaced or sustained,S1 and S2 within normal limits, no S3, no S4, no clicks, no rubs, no murmurs ABD:  Flat, positive bowel sounds normal in frequency in pitch, no bruits, no rebound, no guarding, no midline pulsatile mass, no hepatomegaly, no splenomegaly EXT:  2 plus pulses upper, absent dorsalis pedis and posterior tibialis on the left, no edema, no cyanosis no clubbing, status post right AKA SKIN:  No rashes no nodules NEURO:  Cranial nerves II through XII grossly intact, motor grossly intact throughout PSYCH:  Cognitively intact, oriented to person place and time    EKG:  EKG is ordered today. The ekg ordered today demonstrates sinus rhythm, premature atrial, right bundle branch block, no significant change from previous, possible  infarct.   Recent Labs: 09/15/2020: ALT 10; BUN 14; Creatinine 1.16; Hemoglobin 11.4; Platelet Count 614; Potassium 5.3; Sodium 144    Lipid Panel    Component Value Date/Time   CHOL 94 07/11/2014 0430   TRIG 101 07/11/2014 0430   HDL 34 (L) 07/11/2014 0430   CHOLHDL 2.8 07/11/2014 0430   VLDL 20 07/11/2014 0430   LDLCALC 40 07/11/2014 0430      Wt Readings from Last 3 Encounters:  09/22/20 164 lb 6.4 oz (74.6 kg)  09/15/20 161 lb 12.8 oz (73.4 kg)  12/05/15 195 lb 12.8 oz (88.8 kg)      Other studies Reviewed: Additional studies/ records that were reviewed today include: Labs.  Review of outside records  Review of the above records demonstrates:  Please see elsewhere in the note.     ASSESSMENT AND PLAN:  CAD/CABG: The patient has had no recent unstable symptoms.  He seems to have a stable pattern of angina.  I need to get the records from [place] to see what his last catheterization demonstrated.  In the meantime he has excellent risk reduction.  HTN:  The blood pressure is at target.  No change in therapy.   DM:    A1c is 5.8.  Continue other meds as listed.   PVD:   Again I will get records as above.  He will continue with risk reduction.   HYPERLIPIDEMIA:  His LDL was 76.  He will continue on the meds as listed.   Current medicines are reviewed at length with the patient today.  The patient does not have concerns regarding medicines.  The following changes have been made:  no change  Labs/ tests ordered today include: None  Orders Placed This Encounter  Procedures  . EKG 12-Lead   ADDENDUM: After the patient left the office I was able to get 1 cardiovascular office note from [facility].  He reports that his cardiac catheterization on 05/11/2020 demonstrated 100% left main and mid RCA in-stent lesions.  LIMA to the LAD apparently was patent collaterals to the right coronary artery.  He had a patent vein graft to an OM  1 with a patent stent in the  vein graft.  He had an occluded vein graft to the RCA.  He had 100% left SFA occlusion.  He was managed medically.  He did have retrograde cannulation of his left tibial peroneal trunk and had this angioplasty.  Carotid Dopplers demonstrated 40 to 50% stenosis on the left.  Echo demonstrated an EF of 60%.  Disposition:   FU with me in 3 months.     Signed, Minus Breeding, MD  09/23/2020 10:59 AM    [place]

## 2020-09-22 ENCOUNTER — Other Ambulatory Visit: Payer: Self-pay

## 2020-09-22 ENCOUNTER — Ambulatory Visit (INDEPENDENT_AMBULATORY_CARE_PROVIDER_SITE_OTHER): Payer: Medicare Other | Admitting: Cardiology

## 2020-09-22 ENCOUNTER — Encounter: Payer: Self-pay | Admitting: Cardiology

## 2020-09-22 VITALS — BP 130/83 | HR 91 | Temp 97.5°F | Ht 67.0 in | Wt 164.4 lb

## 2020-09-22 DIAGNOSIS — I10 Essential (primary) hypertension: Secondary | ICD-10-CM

## 2020-09-22 DIAGNOSIS — I739 Peripheral vascular disease, unspecified: Secondary | ICD-10-CM | POA: Diagnosis not present

## 2020-09-22 DIAGNOSIS — E785 Hyperlipidemia, unspecified: Secondary | ICD-10-CM

## 2020-09-22 DIAGNOSIS — I2510 Atherosclerotic heart disease of native coronary artery without angina pectoris: Secondary | ICD-10-CM | POA: Diagnosis not present

## 2020-09-22 MED ORDER — ISOSORBIDE MONONITRATE ER 60 MG PO TB24: 60.0000 mg | ORAL_TABLET | Freq: Two times a day (BID) | ORAL | 3 refills | Status: DC

## 2020-09-22 MED ORDER — CARVEDILOL 6.25 MG PO TABS: 6.2500 mg | ORAL_TABLET | Freq: Two times a day (BID) | ORAL | 3 refills | Status: DC

## 2020-09-22 MED ORDER — ATORVASTATIN CALCIUM 20 MG PO TABS: 60.0000 mg | ORAL_TABLET | Freq: Every day | ORAL | 3 refills | Status: DC

## 2020-09-22 NOTE — Patient Instructions (Signed)
Medication Instructions:  No changes *If you need a refill on your cardiac medications before your next appointment, please call your pharmacy*  Lab Work: None ordered this visit  Testing/Procedures: None ordered this visit  Follow-Up: At [facility], you and your health needs are our priority.  As part of our continuing mission to provide you with exceptional heart care, we have created designated Provider Care Teams.  These Care Teams include your primary Cardiologist (physician) and Advanced Practice Providers (APPs -  Physician Assistants and Nurse Practitioners) who all work together to provide you with the care you need, when you need it.  Your next appointment:   3 month(s)  The format for your next appointment:   In Person  Provider:   James Hochrein, MD  

## 2020-10-10 ENCOUNTER — Telehealth: Payer: Self-pay | Admitting: Orthopedic Surgery

## 2020-10-10 NOTE — Telephone Encounter (Signed)
12/6 ov note faxed to [employer] [phone]

## 2020-10-11 ENCOUNTER — Telehealth: Payer: Self-pay

## 2020-10-11 NOTE — Telephone Encounter (Signed)
Mr Borge called to cancel his lab appt.  He states he has a runny nose and chest congestion.  I gave him the number for [facility].

## 2020-10-13 ENCOUNTER — Other Ambulatory Visit: Payer: Medicare Other

## 2020-10-18 DIAGNOSIS — U071 COVID-19: Secondary | ICD-10-CM | POA: Diagnosis not present

## 2020-10-25 ENCOUNTER — Ambulatory Visit: Payer: Self-pay

## 2020-10-25 NOTE — Telephone Encounter (Signed)
Patient wanted to know how long he has to wait after COVID-19 positive result before he can get his booster. He states that his test was positive 10/12/20. Patient was told that he needed to complete 5 days iof isolation and 5 days of wearing mask and if symptoms are gone he can get the get the booster after her waits therecommended time period between his vaccine and booster.  He states that he still has SOB and did not have this prior to COVID-19. I advised him to wait for that to improve and also call his PCP for symptom management.  Patient verbalized understanding and will call his PCP for his SOB.  Reason for Disposition . General information question, no triage required and triager able to answer question  Answer Assessment - Initial Assessment Questions 1. REASON FOR CALL or QUESTION: "What is your reason for calling today?" or "How can I best help you?" or "What question do you have that I can help answer?"  patient wanted to know how long he has to wait after COVID-19 positive result before he can get his booster.  Protocols used: INFORMATION ONLY CALL - NO TRIAGE-A-AH

## 2020-11-08 DIAGNOSIS — E785 Hyperlipidemia, unspecified: Secondary | ICD-10-CM | POA: Diagnosis not present

## 2020-11-08 DIAGNOSIS — E1140 Type 2 diabetes mellitus with diabetic neuropathy, unspecified: Secondary | ICD-10-CM | POA: Diagnosis not present

## 2020-11-08 DIAGNOSIS — D471 Chronic myeloproliferative disease: Secondary | ICD-10-CM | POA: Diagnosis not present

## 2020-11-08 DIAGNOSIS — I739 Peripheral vascular disease, unspecified: Secondary | ICD-10-CM | POA: Diagnosis not present

## 2020-11-08 DIAGNOSIS — B351 Tinea unguium: Secondary | ICD-10-CM | POA: Diagnosis not present

## 2020-11-08 DIAGNOSIS — I10 Essential (primary) hypertension: Secondary | ICD-10-CM | POA: Diagnosis not present

## 2020-11-08 DIAGNOSIS — Z89619 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: Secondary | ICD-10-CM | POA: Diagnosis not present

## 2020-11-09 ENCOUNTER — Ambulatory Visit: Payer: Medicare HMO | Attending: Internal Medicine

## 2020-11-09 ENCOUNTER — Telehealth: Payer: Self-pay | Admitting: *Deleted

## 2020-11-09 ENCOUNTER — Other Ambulatory Visit (HOSPITAL_COMMUNITY): Payer: Self-pay | Admitting: Internal Medicine

## 2020-11-09 DIAGNOSIS — Z23 Encounter for immunization: Secondary | ICD-10-CM

## 2020-11-09 NOTE — Telephone Encounter (Signed)
Patient calling concerning his appointment  Returned call back to patient and left V message that his appointment is scheduled for 11/14/20@9 :15 with Dr Susa Griffins.

## 2020-11-09 NOTE — Progress Notes (Signed)
   Covid-19 Vaccination Clinic  Name:  Tyler Whitehead    MRN: [id number] DOB: 04/02/1941  11/09/2020  Tyler Whitehead was observed post Covid-19 immunization for 15 minutes without incident. He was provided with Vaccine Information Sheet and instruction to access the V-Safe system.   Mr. Nurse was instructed to call 911 with any severe reactions post vaccine: Marland Kitchen Difficulty breathing  . Swelling of face and throat  . A fast heartbeat  . A bad rash all over body  . Dizziness and weakness   Immunizations Administered    Name Date Dose VIS Date Route   Moderna Covid-19 Booster Vaccine 11/09/2020 12:58 PM 0.25 mL 07/27/2020 Intramuscular   Manufacturer: Moderna   Lot: [id number]   [place]: [id number]

## 2020-11-14 ENCOUNTER — Encounter: Payer: Self-pay | Admitting: Podiatry

## 2020-11-14 ENCOUNTER — Other Ambulatory Visit: Payer: Self-pay

## 2020-11-14 ENCOUNTER — Ambulatory Visit: Payer: Medicare HMO | Admitting: Podiatry

## 2020-11-14 DIAGNOSIS — E1142 Type 2 diabetes mellitus with diabetic polyneuropathy: Secondary | ICD-10-CM | POA: Insufficient documentation

## 2020-11-14 DIAGNOSIS — J309 Allergic rhinitis, unspecified: Secondary | ICD-10-CM | POA: Insufficient documentation

## 2020-11-14 DIAGNOSIS — E119 Type 2 diabetes mellitus without complications: Secondary | ICD-10-CM | POA: Diagnosis not present

## 2020-11-14 DIAGNOSIS — M79675 Pain in left toe(s): Secondary | ICD-10-CM | POA: Diagnosis not present

## 2020-11-14 DIAGNOSIS — Z89611 Acquired absence of right leg above knee: Secondary | ICD-10-CM

## 2020-11-14 DIAGNOSIS — E559 Vitamin D deficiency, unspecified: Secondary | ICD-10-CM | POA: Insufficient documentation

## 2020-11-14 DIAGNOSIS — E1151 Type 2 diabetes mellitus with diabetic peripheral angiopathy without gangrene: Secondary | ICD-10-CM

## 2020-11-14 DIAGNOSIS — F32A Depression, unspecified: Secondary | ICD-10-CM | POA: Insufficient documentation

## 2020-11-14 DIAGNOSIS — B351 Tinea unguium: Secondary | ICD-10-CM

## 2020-11-14 DIAGNOSIS — K5909 Other constipation: Secondary | ICD-10-CM | POA: Insufficient documentation

## 2020-11-14 DIAGNOSIS — E785 Hyperlipidemia, unspecified: Secondary | ICD-10-CM | POA: Insufficient documentation

## 2020-11-14 DIAGNOSIS — E7800 Pure hypercholesterolemia, unspecified: Secondary | ICD-10-CM | POA: Insufficient documentation

## 2020-11-14 DIAGNOSIS — Z89619 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: Secondary | ICD-10-CM | POA: Insufficient documentation

## 2020-11-14 DIAGNOSIS — R1310 Dysphagia, unspecified: Secondary | ICD-10-CM | POA: Insufficient documentation

## 2020-11-14 DIAGNOSIS — R112 Nausea with vomiting, unspecified: Secondary | ICD-10-CM | POA: Insufficient documentation

## 2020-11-14 DIAGNOSIS — D72829 Elevated white blood cell count, unspecified: Secondary | ICD-10-CM | POA: Insufficient documentation

## 2020-11-14 DIAGNOSIS — Z951 Presence of aortocoronary bypass graft: Secondary | ICD-10-CM | POA: Insufficient documentation

## 2020-11-17 ENCOUNTER — Other Ambulatory Visit: Payer: Self-pay

## 2020-11-17 ENCOUNTER — Inpatient Hospital Stay: Payer: Medicare HMO | Attending: Oncology

## 2020-11-17 DIAGNOSIS — D471 Chronic myeloproliferative disease: Secondary | ICD-10-CM | POA: Diagnosis not present

## 2020-11-17 DIAGNOSIS — D7581 Myelofibrosis: Secondary | ICD-10-CM

## 2020-11-17 LAB — CBC WITH DIFFERENTIAL (CANCER CENTER ONLY)
Abs Immature Granulocytes: 0.11 10*3/uL — ABNORMAL HIGH (ref 0.00–0.07)
Basophils Absolute: 0.1 10*3/uL (ref 0.0–0.1)
Basophils Relative: 1 %
Eosinophils Absolute: 0.2 10*3/uL (ref 0.0–0.5)
Eosinophils Relative: 2 %
HCT: 34 % — ABNORMAL LOW (ref 39.0–52.0)
Hemoglobin: 11.2 g/dL — ABNORMAL LOW (ref 13.0–17.0)
Immature Granulocytes: 1 %
Lymphocytes Relative: 11 %
Lymphs Abs: 1.1 10*3/uL (ref 0.7–4.0)
MCH: 35.1 pg — ABNORMAL HIGH (ref 26.0–34.0)
MCHC: 32.9 g/dL (ref 30.0–36.0)
MCV: 106.6 fL — ABNORMAL HIGH (ref 80.0–100.0)
Monocytes Absolute: 0.6 10*3/uL (ref 0.1–1.0)
Monocytes Relative: 5 %
Neutro Abs: 8.1 10*3/uL — ABNORMAL HIGH (ref 1.7–7.7)
Neutrophils Relative %: 80 %
Platelet Count: 646 10*3/uL — ABNORMAL HIGH (ref 150–400)
RBC: 3.19 MIL/uL — ABNORMAL LOW (ref 4.22–5.81)
RDW: 17.7 % — ABNORMAL HIGH (ref 11.5–15.5)
WBC Count: 10.2 10*3/uL (ref 4.0–10.5)
nRBC: 0 % (ref 0.0–0.2)

## 2020-11-17 NOTE — Progress Notes (Signed)
Subjective: Tyler Whitehead presents today referred by Wenda Low, MD for diabetic foot evaluation.  He is accompanied by his brother on today's visit. He moved here from [facility], [place] in November. He has appointment coming up this month to establish care with Vascular.  He relates he has h/o stent placement of LLE and states is occluded shortly thereafter.  Patient relates 15 year history of diabetes.  Patient has h/o foot infection/gangrene resulting in above knee amputation of RLE.  He states his left great toe is painful. He denies any trauma, but states it feels ingrown in the corner. Pain is aggravated when wearing shoe gear. He denies any redness, drainage or swelling.  Past Medical History:  Diagnosis Date  . CAD (coronary artery disease)    a. S/p 3V CABG 2000 with LIMA-LAD, left radial-PDA, SVG-OM1. b. 2014: stent to SVG-OM1; then found to have obstruction distal to LAD anastamosis of LIMA s/p DES.   . CHF (congestive heart failure) ([place])   . Diverticular disease   . DM (diabetes mellitus) ([place])   . Hemorrhoid   . HTN (hypertension)   . Hyperlipidemia   . Peripheral vascular disease ([place])    a. R femoropopliteal bypass ~2012 in [place], reportedly shown to be occluded. Now followed by Dr. [name].  . 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)   . RBBB     Patient Active Problem List   Diagnosis Date Noted  . 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 ([place]) 11/14/2020  . Allergic rhinitis 11/14/2020  . Chronic constipation 11/14/2020  . Depression 11/14/2020  . Diabetic peripheral neuropathy associated with type 2 diabetes mellitus ([place]) 11/14/2020  . Dysphagia 11/14/2020  . Hyperlipidemia 11/14/2020  . Leukocytosis 11/14/2020  . Nausea and vomiting 11/14/2020  . Presence of aortocoronary bypass graft 11/14/2020  . Pure hypercholesterolemia 11/14/2020  . Vitamin D deficiency 11/14/2020  . Cryptogenic stroke ([place]) 07/20/2015  . History of stroke 07/06/2015  .  Transient vision disturbance, right 07/06/2015  . Atherosclerosis of native arteries of extremity with intermittent claudication ([place]) 09/27/2014  . Localization-related symptomatic epilepsy and epileptic syndromes with complex partial seizures, not intractable, without status epilepticus ([place]) 08/04/2014  . Occipital stroke ([place]) 08/04/2014  . Cerebral thrombosis with cerebral infarction ([place]) 07/10/2014  . New onset seizure ([place]) 07/09/2014  . Diabetic hyperosmolar non-ketotic state ([place]) 07/09/2014  . Uncontrolled hypertension 07/09/2014  . Polycythemia vera ([place]) 07/09/2014  . Seizure ([place]) 07/09/2014  . Visual field defect 07/09/2014  . Headache 06/30/2014  . HTN (hypertension) 11/23/2013  . Dyslipidemia 11/23/2013  . Myelofibrosis ([place]) 11/22/2013  . Diverticular disease 11/22/2013  . Hemorrhoids 11/22/2013  . 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 CAD (coronary artery disease) 11/22/2013  . Peripheral vascular disease ([place]) 11/22/2013  . DM2 (diabetes mellitus, type 2) ([place]) 11/22/2013  . Renal cyst, left 11/22/2013  . Splenomegaly 11/22/2013    Past Surgical History:  Procedure Laterality Date  . ABDOMINAL ANGIOGRAM N/A 01/18/2015   Procedure: ABDOMINAL ANGIOGRAM;  Surgeon: Serafina Mitchell, MD;  Location: [facility] CATH LAB;  Service: Cardiovascular;  Laterality: N/A;  . ABDOMINAL AORTAGRAM N/A 09/21/2014   Procedure: ABDOMINAL Maxcine Ham;  Surgeon: Elam Dutch, MD;  Location: [facility] CATH LAB;  Service: Cardiovascular;  Laterality: N/A;  . CARDIAC CATHETERIZATION  May 2014  . CORONARY ARTERY BYPASS GRAFT  2000   LM occluded, LIMA to LAD patent but distal native LAD disease, saphenous vein bypass graft to OM with 95% stenosis in the midportion, right coronary artery  occluded, radial artery graft to right coronary artery patent but was diffusely small. Marginal graft stented. 7 2014. Of note he had multiple previous stents in the right coronary artery. The distal LAD was treated with angioplasty.  I  . LOOP RECORDER IMPLANT N/A 07/12/2014   Procedure: LOOP RECORDER IMPLANT;  Surgeon: Evans Lance, MD;  Location: [facility] CATH LAB;  Service: Cardiovascular;  Laterality: N/A;  . LOWER EXTREMITY ANGIOGRAM  09/21/2014   Procedure: LOWER EXTREMITY ANGIOGRAM;  Surgeon: Elam Dutch, MD;  Location: [facility] CATH LAB;  Service: Cardiovascular;;  . PERCUTANEOUS CORONARY STENT INTERVENTION (PCI-S)  April 27 2013  . right leg amputation  2017  . TEE WITHOUT CARDIOVERSION N/A 07/12/2014   Procedure: TRANSESOPHAGEAL ECHOCARDIOGRAM (TEE);  Surgeon: Fay Records, MD;  Location: [facility] ENDOSCOPY;  Service: Cardiovascular;  Laterality: N/A;  . VEIN SURGERY      Current Outpatient Medications on File Prior to Visit  Medication Sig Dispense Refill  . chlorpheniramine-HYDROcodone (TUSSIONEX) 10-8 MG/5ML SUER 5 ml as needed    . aspirin 81 MG chewable tablet 1 tablet    . atorvastatin (LIPITOR) 40 MG tablet 1 tablet    . carvedilol (COREG) 3.125 MG tablet 1 tablet with food    . carvedilol (COREG) 6.25 MG tablet Take 1 tablet (6.25 mg total) by mouth 2 (two) times daily with a meal. 180 tablet 3  . cholecalciferol (VITAMIN D) 1000 UNITS tablet Take 1,000 Units by mouth daily.    . cilostazol (PLETAL) 100 MG tablet 1 tablet 30 minutes before or 2 hours after breakfast and dinner    . clopidogrel (PLAVIX) 75 MG tablet clopidogrel 75 mg tablet    . diclofenac Sodium (VOLTAREN) 1 % GEL Voltaren 1 % topical gel    . fluticasone (FLONASE) 50 MCG/ACT nasal spray Place 2 sprays into both nostrils as directed.    . gabapentin (NEURONTIN) 300 MG capsule 1 capsule    . HYDROcodone-acetaminophen (NORCO/VICODIN) 5-325 MG tablet hydrocodone 5 mg-acetaminophen 325 mg tablet    . hydroxyurea (HYDREA) 500 MG capsule Take 3 cap in morning and 2 cap after dinner. May take with food to minimize GI side effects. 150 capsule 0  . insulin glargine (LANTUS) 100 UNIT/ML injection Inject 22 Units into the skin at bedtime.    . insulin  lispro (HUMALOG) 100 UNIT/ML injection Inject 4 Units into the skin 3 (three) times daily before meals.     . isosorbide mononitrate (IMDUR) 60 MG 24 hr tablet Take 1-2 tablets (60-120 mg total) by mouth 2 (two) times daily. 2 in the morning and 1 in the evening 270 tablet 3  . levocetirizine (XYZAL) 5 MG tablet 1 tablet in the evening    . Linagliptin-Metformin HCl 2.5-500 MG TABS Take 1 tablet by mouth 2 (two) times daily.    Marland Kitchen losartan (COZAAR) 25 MG tablet 1 tablet    . neomycin-polymyxin-dexamethasone (MAXITROL) 0.1 % ophthalmic suspension neomycin-polymyxin-dexameth 3.5 mg/mL-10,000 unit/mL-0.1% eye drops    . nitroGLYCERIN (NITROSTAT) 0.4 MG SL tablet Place 1 tablet (0.4 mg total) under the tongue every 5 (five) minutes as needed for chest pain. 25 tablet 12  . PROAIR HFA 108 (90 BASE) MCG/ACT inhaler Inhale 1 puff into the lungs every 4 (four) hours as needed for wheezing or shortness of breath.     . tamsulosin (FLOMAX) 0.4 MG CAPS capsule tamsulosin 0.4 mg capsule  Take 2 capsules every day by oral route after meals for 90 days.    Marland Kitchen  tiZANidine (ZANAFLEX) 2 MG tablet tizanidine 2 mg tablet    . triamcinolone ointment (KENALOG) 0.1 % Apply 1 application topically 3 (three) times daily. 30 g 1   No current facility-administered medications on file prior to visit.     No Known Allergies  Social History   Occupational History  . Occupation: Limo driver  Tobacco Use  . Smoking status: Never Smoker  . Smokeless tobacco: Never Used  Substance and Sexual Activity  . Alcohol use: No    Alcohol/week: 0.0 standard drinks  . Drug use: No  . Sexual activity: Not on file    Family History  Problem Relation Age of Onset  . Asthma Father   . Heart disease Father   . Hyperlipidemia Maternal Grandmother   . CAD Other        Multiple siblings  . Cancer Other        niece had lung cancer  . Cancer Paternal Uncle        brain cancer     Immunization History  Administered Date(s)  Administered  . Influenza-Unspecified 07/08/2014  . Moderna SARS-COV2 Booster Vaccination 11/09/2020    Objective: There were no vitals filed for this visit.  Tyler Whitehead is a pleasant 80 y.o. male WD, WN in NAD. AAO X 3.  Vascular Examination: Capillary fill time to digits delayed left lower extremity. Nonpalpable DP pulse(s) left lower extremity. Nonpalpable PT pulse(s) left lower extremity. Pedal hair absent. Lower extremity skin temperature gradient within normal limits. No pain with calf compression b/l. Trace edema noted left lower extremity. No ischemia or gangrene noted left lower extremity.  Dermatological Examination: Pedal skin is thin shiny, atrophic left lower extremity. Toenails left lower extremity elongated, discolored, dystrophic, thickened, and crumbly with subungual debris and tenderness to dorsal palpation. Incurvated nailplate medial border(s) L hallux.  Nail border hypertrophy present. There is tenderness to palpation. Sign(s) of infection: no clinical signs of infection noted on examination today..  Musculoskeletal Examination: Muscle strength 5/5 to all LE muscle groups of left lower extremity. Lower extremity amputation(s): above knee amputation left lower extremity.  Footwear Assessment: Does the patient wear appropriate shoes? Yes. Does the patient need inserts/orthotics? Yes..  Neurological Examination: Protective sensation intact with 10 gram monofilament left lower extremity. Vibratory sensation diminished left lower extremity.   Assessment: 1. Pain due to onychomycosis of toenails of both feet   2. Status post above-knee amputation of right lower extremity ([place])   3. Type II diabetes mellitus with peripheral circulatory disorder (HCC)   4. Encounter for diabetic foot exam ([place])      ADA Risk Categorization: High Risk:  Patient has one or more of the following: Loss of protective sensation Absent pedal pulses Severe Foot deformity History of  foot ulcer  Plan: -Examined patient. -Diabetic foot examination performed on today's visit. -He has upcoming appointment to establish care with Vascular Surgery. -Discussed diabetic foot care principles. Literature dispensed on today. -Patient to continue soft, supportive shoe gear daily. Schedule for diabetic shoes. He qualifies based on today's examination. -Toenails 1-5 LLE debrided in length and girth without iatrogenic bleeding with sterile nail nipper and dremel. He was advised to apply Neosporin to left great toe once daily. -Patient to report any pedal injuries to medical professional immediately. -Patient/POA to call should there be question/concern in the interim.  Return in about 9 weeks (around 01/16/2021).

## 2020-11-23 ENCOUNTER — Telehealth: Payer: Self-pay | Admitting: Podiatry

## 2020-11-23 NOTE — Telephone Encounter (Signed)
Pt ft message stating he was just confirming that he got the message and is aware is appt is on Tuesday 2.22 @ 930am..  Upon checking his appt is on Monday 2.21 @ 930 and I have left message for pt that his appt is on Monday 2.21 @ 930 not Tuesday @ 930

## 2020-11-28 ENCOUNTER — Ambulatory Visit: Payer: Medicare HMO | Admitting: Orthotics

## 2020-11-28 ENCOUNTER — Other Ambulatory Visit: Payer: Self-pay

## 2020-11-28 DIAGNOSIS — Z89611 Acquired absence of right leg above knee: Secondary | ICD-10-CM

## 2020-11-28 DIAGNOSIS — E1151 Type 2 diabetes mellitus with diabetic peripheral angiopathy without gangrene: Secondary | ICD-10-CM

## 2020-11-28 NOTE — Progress Notes (Signed)

## 2020-12-01 DIAGNOSIS — Z89611 Acquired absence of right leg above knee: Secondary | ICD-10-CM | POA: Diagnosis not present

## 2020-12-12 NOTE — Progress Notes (Signed)
[facility]   Telephone:(336) [phone] [phone]) [phone]   Clinic Follow up Note   Patient Care Team: Wenda Low, MD as PCP - General (Internal Medicine) Debara Pickett Nadean Corwin, MD as Consulting Physician (Cardiology) Minus Breeding, MD as Consulting Physician (Cardiology) Cameron Sprang, MD as Consulting Physician (Neurology)  Date of Service:  12/15/2020  CHIEF COMPLAINT: Myelofibrosis, re-establish oncology care   SUMMARY OF ONCOLOGIC HISTORY: Oncology History  Myelofibrosis ([place])  12/26/2012 Bone Marrow Biopsy   Showed hypercellularity with reticulum fibrosis, JAK-2 mutation positive, BCR/ABL negative, 28 XY in 20 metaphase, and Intermediate-1 ,DIPSS of 2. (WBC < 25; Hbg > 10;    01/02/2013 Imaging   US abdomen complete revealed Mild splenogmealy (13.3 cm) and a 2.2 x 1.7 x 1.8 cm left renal cyst   01/15/2013 Pathology Results   Integrated hematopathology report.  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    03/17/2013 Imaging   CT Chest: Crowded peribronchial markings noted, associated with linear densities involving the left base, probably representing areas of platelike atelectasis/scarring.  No mass or infiltrate seen.    03/19/2013 - 07/20/2013 Chemotherapy   Started Jakafi 20 mg bid. Dose reduced due to anemia.  Stopped due to increasing fatigue while on jakafi.    04/28/2013 - 05/01/2013 Hospital Admission   Admitted to [facility] by Dr. Francia Greaves, his cardiologist, due to the fact that he had midsternal chest pain.  He had a coronary stent placed this admission.    05/28/2013 - 05/29/2013 Hospital Admission   Admitted to [facility] and received 3 units of blood (Dr. Lendon Colonel note).    06/10/2013 Surgery   Cololonoscopy due to GIB.  C/w diverticular disease and hemorrhoids but no active bleeding.     10/05/2013 -  Chemotherapy   Started Hydrea 500 mg daily.   03/19/2014 -  Chemotherapy   Hydrea increased to 500 mg bid wbc 17, hb 17, hct 54, plt 437   06/29/2014 Procedure   cbc shows wbc 18.8 hb 18.7 hct 58.9 plt 456. ?compliance w hydrea as MCV not elevated. symptomatic start TP weekly x 4   07/28/2014 Procedure   TP every 2 weeks if hct > 45, changed to monthly in April 2016      CURRENT THERAPY:  Hydrea 1000 mg in the morning, and 570m in evening   INTERVAL HISTORY:  Tyler LIGHTCAPis here for a follow up f/u of Myelofibrosis, he presents to the clinic with his brother.  He came in with a walker. Doing well overall He is on hydrea 10031m am and 50072mm, tolerating well, he did not go up the dose as I recommended on last visit  Has left LE edema up to knee, and circulation issue, is going to see vascular surgeon next week  He has prosthesis in the right leg, and uses a walker  NO other new complains   All other systems were reviewed with the patient and are negative.  MEDICAL HISTORY:  Past Medical History:  Diagnosis Date  . CAD (coronary artery disease)    a. S/p 3V CABG 2000 with LIMA-LAD, left radial-PDA, SVG-OM1. b. 2014: stent to SVG-OM1; then found to have obstruction distal to LAD anastamosis of LIMA s/p DES.   . CHF (congestive heart failure) ([place] . Diverticular disease   . DM (diabetes mellitus) ([place] . Hemorrhoid   . HTN (hypertension)   .  Hyperlipidemia   . Peripheral vascular disease ([place])    a. R femoropopliteal bypass ~2012 in [place], reportedly shown to be occluded. Now followed by Dr. [name].  . 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[place])   . RBBB     SURGICAL HISTORY: Past Surgical History:  Procedure Laterality Date  . ABDOMINAL ANGIOGRAM N/A 01/18/2015   Procedure: ABDOMINAL ANGIOGRAM;  Surgeon: Serafina Mitchell, MD;  Location[facility] CATH LAB;  Service: Cardiovascular;  Laterality: N/A;  . ABDOMINAL AORTAGRAM N/A 09/21/2014   Procedure: ABDOMINAL Maxcine Ham;   Surgeon: Elam Dutch, MD;  Location: [facility] CATH LAB;  Service: Cardiovascular;  Laterality: N/A;  . CARDIAC CATHETERIZATION  May 2014  . CORONARY ARTERY BYPASS GRAFT  2000   LM occluded, LIMA to LAD patent but distal native LAD disease, saphenous vein bypass graft to OM with 95% stenosis in the midportion, right coronary artery occluded, radial artery graft to right coronary artery patent but was diffusely small. Marginal graft stented. 7 2014. Of note he had multiple previous stents in the right coronary artery. The distal LAD was treated with angioplasty. I  . LOOP RECORDER IMPLANT N/A 07/12/2014   Procedure: LOOP RECORDER IMPLANT;  Surgeon: Evans Lance, MD;  Location: [facility] CATH LAB;  Service: Cardiovascular;  Laterality: N/A;  . LOWER EXTREMITY ANGIOGRAM  09/21/2014   Procedure: LOWER EXTREMITY ANGIOGRAM;  Surgeon: Elam Dutch, MD;  Location: [facility] CATH LAB;  Service: Cardiovascular;;  . PERCUTANEOUS CORONARY STENT INTERVENTION (PCI-S)  April 27 2013  . right leg amputation  2017  . TEE WITHOUT CARDIOVERSION N/A 07/12/2014   Procedure: TRANSESOPHAGEAL ECHOCARDIOGRAM (TEE);  Surgeon: Fay Records, MD;  Location: [place];  Service: Cardiovascular;  Laterality: N/A;  . VEIN SURGERY      I have reviewed the social history and family history with the patient and they are unchanged from previous note.  ALLERGIES:  has No Known Allergies.  MEDICATIONS:  Current Outpatient Medications  Medication Sig Dispense Refill  . aspirin 81 MG chewable tablet 1 tablet    . atorvastatin (LIPITOR) 40 MG tablet 1 tablet    . carvedilol (COREG) 3.125 MG tablet 1 tablet with food    . carvedilol (COREG) 6.25 MG tablet Take 1 tablet (6.25 mg total) by mouth 2 (two) times daily with a meal. 180 tablet 3  . chlorpheniramine-HYDROcodone (TUSSIONEX) 10-8 MG/5ML SUER 5 ml as needed    . cholecalciferol (VITAMIN D) 1000 UNITS tablet Take 1,000 Units by mouth daily.    . cilostazol (PLETAL) 100 MG tablet 1  tablet 30 minutes before or 2 hours after breakfast and dinner    . clopidogrel (PLAVIX) 75 MG tablet clopidogrel 75 mg tablet    . diclofenac Sodium (VOLTAREN) 1 % GEL Voltaren 1 % topical gel    . fluticasone (FLONASE) 50 MCG/ACT nasal spray Place 2 sprays into both nostrils as directed.    . gabapentin (NEURONTIN) 300 MG capsule 1 capsule    . HYDROcodone-acetaminophen (NORCO/VICODIN) 5-325 MG tablet hydrocodone 5 mg-acetaminophen 325 mg tablet    . hydroxyurea (HYDREA) 500 MG capsule Take 3 cap in morning and 2 cap after dinner. May take with food to minimize GI side effects. 150 capsule 0  . insulin glargine (LANTUS) 100 UNIT/ML injection Inject 22 Units into the skin at bedtime.    . insulin lispro (HUMALOG) 100 UNIT/ML injection Inject 4 Units into the skin 3 (three) times daily before meals.     . isosorbide mononitrate (IMDUR) 60 MG 24 hr  tablet Take 1-2 tablets (60-120 mg total) by mouth 2 (two) times daily. 2 in the morning and 1 in the evening 270 tablet 3  . levocetirizine (XYZAL) 5 MG tablet 1 tablet in the evening    . Linagliptin-Metformin HCl 2.5-500 MG TABS Take 1 tablet by mouth 2 (two) times daily.    Marland Kitchen losartan (COZAAR) 25 MG tablet 1 tablet    . neomycin-polymyxin-dexamethasone (MAXITROL) 0.1 % ophthalmic suspension neomycin-polymyxin-dexameth 3.5 mg/mL-10,000 unit/mL-0.1% eye drops    . nitroGLYCERIN (NITROSTAT) 0.4 MG SL tablet Place 1 tablet (0.4 mg total) under the tongue every 5 (five) minutes as needed for chest pain. 25 tablet 12  . PROAIR HFA 108 (90 BASE) MCG/ACT inhaler Inhale 1 puff into the lungs every 4 (four) hours as needed for wheezing or shortness of breath.     . tamsulosin (FLOMAX) 0.4 MG CAPS capsule tamsulosin 0.4 mg capsule  Take 2 capsules every day by oral route after meals for 90 days.    Marland Kitchen tiZANidine (ZANAFLEX) 2 MG tablet tizanidine 2 mg tablet    . triamcinolone ointment (KENALOG) 0.1 % Apply 1 application topically 3 (three) times daily. 30 g 1    No current facility-administered medications for this visit.    PHYSICAL EXAMINATION: ECOG PERFORMANCE STATUS: 2 - Symptomatic, <50% confined to bed  Vitals:   12/15/20 1327  BP: 138/63  Pulse: 85  Resp: 18  Temp: (!) 96.6 F (35.9 C)  SpO2: 100%   Filed Weights   12/15/20 1327  Weight: 162 lb (73.5 kg)    GENERAL:alert, no distress and comfortable SKIN: skin color, texture, turgor are normal, no rashes or significant lesions EYES: normal, Conjunctiva are pink and non-injected, sclera clear NECK: supple, thyroid normal size, non-tender, without nodularity LYMPH:  no palpable lymphadenopathy in the cervical, axillary  LUNGS: clear to auscultation and percussion with normal breathing effort HEART: regular rate & rhythm, (+) murmurs, and (+) 1+ pitting edema in left lower extremity  ABDOMEN:abdomen soft, non-tender and normal bowel sounds Musculoskeletal:no cyanosis of digits and no clubbing  NEURO: alert & oriented x 3 with fluent speech, no focal motor/sensory deficits  LABORATORY DATA:  I have reviewed the data as listed CBC Latest Ref Rng & Units 12/15/2020 11/17/2020 09/15/2020  WBC 4.0 - 10.5 K/uL 9.7 10.2 19.7(H)  Hemoglobin 13.0 - 17.0 g/dL 11.3(L) 11.2(L) 11.4(L)  Hematocrit 39.0 - 52.0 % 34.7(L) 34.0(L) 35.7(L)  Platelets 150 - 400 K/uL 493(H) 646(H) 614(H)     CMP Latest Ref Rng & Units 09/15/2020 10/28/2015 07/28/2015  Glucose 70 - 99 mg/dL 149(H) 196(H) 128  BUN 8 - 23 mg/dL 14 12.9 10.3  Creatinine 0.61 - 1.24 mg/dL 1.16 1.1 1.0  Sodium 135 - 145 mmol/L 144 142 143  Potassium 3.5 - 5.1 mmol/L 5.3(H) 4.1 3.9  Chloride 98 - 111 mmol/L 108 - -  CO2 22 - 32 mmol/L '30 29 27  ' Calcium 8.9 - 10.3 mg/dL 9.4 9.2 9.1  Total Protein 6.5 - 8.1 g/dL 6.3(L) 6.6 6.0(L)  Total Bilirubin 0.3 - 1.2 mg/dL 0.5 0.46 0.44  Alkaline Phos 38 - 126 U/L 106 110 101  AST 15 - 41 U/L '15 18 12  ' ALT 0 - 44 U/L 10 18 <9      RADIOGRAPHIC STUDIES: I have personally reviewed the  radiological images as listed and agreed with the findings in the report. No results found.   ASSESSMENT & PLAN:  Tyler Whitehead is a 80 y.o. male with  1. Primary Myeloproliferative neoplasm (Myelofibrosis), JAK2 mutation (+), DIPSS 2, intermediate risk-1  -he was diagnosed in 2014. -We discussed that this is not curable disease, but treatable. Some patient will develop acute leukemia or aplastic anemia later on. -he has intermediate risk disease, which predicts median survival 6.5 years, he has been doing better than average for sure  -he is not a good candidate for stem cell transplant due to his advanced age and multiple medical comorbidities. -I discussed that he is at high risk for thrombosis secondary to his disease, he will continue aspirin and plavix  -he tried [place] for 4 months in 2014, could not tolerate due to his anemia and fatigue. -We again emphasized the importance of continued Hydrea and normalized his blood counts to prevent further stroke or thrombosis. He agrees to continue Hydrea and compliant with treat -He is currently on Hydrea 1000 mg in the morning, and 500 milligrams in the evening.   -Lab reviewed, platelet 493K today, Hg 11.3, will continue current dose of Hydrea, goal is to keep Plt <500K  -Labs every 2 months, follow-up with Laicie in 4 months   2. DM, s/p right AKA -He has a right leg prosthesis, uses a walker, he lives independently. -Follow-up with Dr. Deforest Hoyles  3. CADz s/p CABG x 3, hypertension --Continue lisinopril, coreg, aspirin and plavix  -He will follow up with his cardiologist  4. PvDz.  --Continue Imdur as it appearing to have improved some of his claudication symptoms.  -He is scheduled to see a vascular surgeon next week  5. Dyslipidemia. --Continue statin therapy.  6. Stroke and seizure in 07/2014 -- follow up with his neurologist   Plan; -continue Hydrea, 1040m in am, and 5059min evening  --Labs every 2 months,  follow-up with Laicie in 4 months    No problem-specific Assessment & Plan notes found for this encounter.   No orders of the defined types were placed in this encounter.  All questions were answered. The patient knows to call the clinic with any problems, questions or concerns. No barriers to learning was detected. The total time spent in the appointment was 25 minutes.     YaTruitt MerleMD 12/15/2020   I, AmJoslyn Devonam acting as scribe for YaTruitt MerleMD.   I have reviewed the above documentation for accuracy and completeness, and I agree with the above.

## 2020-12-15 ENCOUNTER — Inpatient Hospital Stay (HOSPITAL_BASED_OUTPATIENT_CLINIC_OR_DEPARTMENT_OTHER): Payer: Medicare HMO | Admitting: Hematology

## 2020-12-15 ENCOUNTER — Telehealth: Payer: Self-pay | Admitting: Hematology

## 2020-12-15 ENCOUNTER — Encounter: Payer: Self-pay | Admitting: Hematology

## 2020-12-15 ENCOUNTER — Other Ambulatory Visit: Payer: Self-pay

## 2020-12-15 ENCOUNTER — Inpatient Hospital Stay: Payer: Medicare HMO | Attending: Oncology

## 2020-12-15 VITALS — BP 138/63 | HR 85 | Temp 96.6°F | Resp 18 | Ht 67.0 in | Wt 162.0 lb

## 2020-12-15 DIAGNOSIS — Z8673 Personal history of transient ischemic attack (TIA), and cerebral infarction without residual deficits: Secondary | ICD-10-CM | POA: Diagnosis not present

## 2020-12-15 DIAGNOSIS — Z7902 Long term (current) use of antithrombotics/antiplatelets: Secondary | ICD-10-CM | POA: Diagnosis not present

## 2020-12-15 DIAGNOSIS — D7581 Myelofibrosis: Secondary | ICD-10-CM | POA: Diagnosis not present

## 2020-12-15 DIAGNOSIS — D471 Chronic myeloproliferative disease: Secondary | ICD-10-CM | POA: Insufficient documentation

## 2020-12-15 DIAGNOSIS — Z79899 Other long term (current) drug therapy: Secondary | ICD-10-CM | POA: Diagnosis not present

## 2020-12-15 DIAGNOSIS — Z7982 Long term (current) use of aspirin: Secondary | ICD-10-CM | POA: Diagnosis not present

## 2020-12-15 DIAGNOSIS — I739 Peripheral vascular disease, unspecified: Secondary | ICD-10-CM

## 2020-12-15 DIAGNOSIS — I70219 Atherosclerosis of native arteries of extremities with intermittent claudication, unspecified extremity: Secondary | ICD-10-CM

## 2020-12-15 LAB — CBC WITH DIFFERENTIAL (CANCER CENTER ONLY)
Abs Immature Granulocytes: 0.05 10*3/uL (ref 0.00–0.07)
Basophils Absolute: 0.1 10*3/uL (ref 0.0–0.1)
Basophils Relative: 1 %
Eosinophils Absolute: 0.2 10*3/uL (ref 0.0–0.5)
Eosinophils Relative: 2 %
HCT: 34.7 % — ABNORMAL LOW (ref 39.0–52.0)
Hemoglobin: 11.3 g/dL — ABNORMAL LOW (ref 13.0–17.0)
Immature Granulocytes: 1 %
Lymphocytes Relative: 9 %
Lymphs Abs: 0.9 10*3/uL (ref 0.7–4.0)
MCH: 35.5 pg — ABNORMAL HIGH (ref 26.0–34.0)
MCHC: 32.6 g/dL (ref 30.0–36.0)
MCV: 109.1 fL — ABNORMAL HIGH (ref 80.0–100.0)
Monocytes Absolute: 0.4 10*3/uL (ref 0.1–1.0)
Monocytes Relative: 4 %
Neutro Abs: 8.1 10*3/uL — ABNORMAL HIGH (ref 1.7–7.7)
Neutrophils Relative %: 83 %
Platelet Count: 493 10*3/uL — ABNORMAL HIGH (ref 150–400)
RBC: 3.18 MIL/uL — ABNORMAL LOW (ref 4.22–5.81)
RDW: 20.5 % — ABNORMAL HIGH (ref 11.5–15.5)
WBC Count: 9.7 10*3/uL (ref 4.0–10.5)
nRBC: 0 % (ref 0.0–0.2)

## 2020-12-15 NOTE — Telephone Encounter (Signed)
Scheduled appt per 3/10 los- gave patient AVS and calender per los   

## 2020-12-16 ENCOUNTER — Other Ambulatory Visit: Payer: Self-pay

## 2020-12-16 DIAGNOSIS — I739 Peripheral vascular disease, unspecified: Secondary | ICD-10-CM

## 2020-12-19 ENCOUNTER — Other Ambulatory Visit: Payer: Self-pay

## 2020-12-19 ENCOUNTER — Ambulatory Visit (INDEPENDENT_AMBULATORY_CARE_PROVIDER_SITE_OTHER)
Admission: RE | Admit: 2020-12-19 | Discharge: 2020-12-19 | Disposition: A | Discharge status: Home / Self Care | Payer: Medicare HMO | Source: Ambulatory Visit | Attending: Surgery | Admitting: Surgery

## 2020-12-19 ENCOUNTER — Encounter: Payer: Self-pay | Admitting: Surgery

## 2020-12-19 ENCOUNTER — Ambulatory Visit (INDEPENDENT_AMBULATORY_CARE_PROVIDER_SITE_OTHER): Payer: Medicare HMO | Admitting: Surgery

## 2020-12-19 ENCOUNTER — Ambulatory Visit (HOSPITAL_COMMUNITY)
Admission: RE | Admit: 2020-12-19 | Discharge: 2020-12-19 | Disposition: A | Discharge status: Home / Self Care | Payer: Medicare HMO | Source: Ambulatory Visit | Attending: Surgery | Admitting: Surgery

## 2020-12-19 VITALS — BP 124/69 | HR 83 | Temp 98.3°F | Wt 162.6 lb

## 2020-12-19 DIAGNOSIS — I70219 Atherosclerosis of native arteries of extremities with intermittent claudication, unspecified extremity: Secondary | ICD-10-CM | POA: Diagnosis not present

## 2020-12-19 DIAGNOSIS — I70213 Atherosclerosis of native arteries of extremities with intermittent claudication, bilateral legs: Secondary | ICD-10-CM

## 2020-12-19 DIAGNOSIS — I739 Peripheral vascular disease, unspecified: Secondary | ICD-10-CM | POA: Insufficient documentation

## 2020-12-19 NOTE — Progress Notes (Signed)
[facility] [place]  Patient name: Tyler Whitehead MRN: [id number] DOB: 03/29/1941 Sex: male   REQUESTING PROVIDER:    Dr. Lysle Rubens   REASON FOR CONSULT:    PAD  HISTORY OF PRESENT ILLNESS:   Tyler Whitehead is a 80 y.o. male, who I last saw in 2016 for peripheral vascular disease.  The patient has a history of a right above-knee femoral-popliteal bypass graft in [place].  This was done with Gore-Tex because his veins have been harvested for his heart.  This lasted about 2 to 3 years.  Before being revascularized, he had a stroke.  As result of this he went to [place] to be with his daughters.  Somehow he ended up with gangrene of his right leg and ended up with an above-knee amputation.  He also appears to have had a left leg bypass also in [place].  He complains of occasional pain in his leg as well as swelling.  The patient suffers from coronary artery disease, status post CABG.  He is a diabetic.  He is medically managed for hypertension.  He takes a statin for hypercholesterolemia.  He is a non-smoker.  PAST MEDICAL HISTORY    Past Medical History:  Diagnosis Date  . CAD (coronary artery disease)    a. S/p 3V CABG 2000 with LIMA-LAD, left radial-PDA, SVG-OM1. b. 2014: stent to SVG-OM1; then found to have obstruction distal to LAD anastamosis of LIMA s/p DES.   . CHF (congestive heart failure) ([place])   . Diverticular disease   . DM (diabetes mellitus) ([place])   . Hemorrhoid   . HTN (hypertension)   . Hyperlipidemia   . Peripheral vascular disease ([place])    a. R femoropopliteal bypass ~2012 in [place], reportedly shown to be occluded. Now followed by Dr. [name].  . 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[place])   . RBBB      FAMILY HISTORY   Family History  Problem Relation Age of Onset  . Asthma Father   . Heart disease Father   . Hyperlipidemia Maternal Grandmother   . CAD Other        Multiple siblings  . Cancer Other        niece  had lung cancer  . Cancer Paternal Uncle        brain cancer     SOCIAL HISTORY:   Social History   Socioeconomic History  . Marital status: Divorced    Spouse name: Not on file  . Number of children: Not on file  . Years of education: Not on file  . Highest education level: Not on file  Occupational History  . Occupation: Limo driver  Tobacco Use  . Smoking status: Never Smoker  . Smokeless tobacco: Never Used  Substance and Sexual Activity  . Alcohol use: No    Alcohol/week: 0.0 standard drinks  . Drug use: No  . Sexual activity: Not on file  Other Topics Concern  . Not on file  Social History Narrative   Lives with wife.    Social Determinants of Health   Financial Resource Strain: Not on file  Food Insecurity: Not on file  Transportation Needs: Not on file  Physical Activity: Not on file  Stress: Not on file  Social Connections: Not on file  Intimate Partner Violence: Not on file    ALLERGIES:    No Known Allergies  CURRENT MEDICATIONS:    Current Outpatient Medications  Medication Sig Dispense Refill  . aspirin 81 MG chewable  tablet 1 tablet    . atorvastatin (LIPITOR) 40 MG tablet 1 tablet    . carvedilol (COREG) 3.125 MG tablet 1 tablet with food    . chlorpheniramine-HYDROcodone (TUSSIONEX) 10-8 MG/5ML SUER 5 ml as needed    . clopidogrel (PLAVIX) 75 MG tablet clopidogrel 75 mg tablet    . diclofenac Sodium (VOLTAREN) 1 % GEL Voltaren 1 % topical gel    . fluticasone (FLONASE) 50 MCG/ACT nasal spray Place 2 sprays into both nostrils as directed.    . gabapentin (NEURONTIN) 300 MG capsule 1 capsule    . HYDROcodone-acetaminophen (NORCO/VICODIN) 5-325 MG tablet hydrocodone 5 mg-acetaminophen 325 mg tablet    . hydroxyurea (HYDREA) 500 MG capsule Take 500 mg by mouth daily. Take 2 tablets in the am and 1 tablet in the pm. May take with food to minimize GI side effects.    . insulin glargine (LANTUS) 100 UNIT/ML injection Inject 22 Units into the skin  at bedtime.    . isosorbide mononitrate (IMDUR) 60 MG 24 hr tablet Take 1-2 tablets (60-120 mg total) by mouth 2 (two) times daily. 2 in the morning and 1 in the evening 270 tablet 3  . levocetirizine (XYZAL) 5 MG tablet 1 tablet in the evening    . losartan (COZAAR) 25 MG tablet 1 tablet    . nitroGLYCERIN (NITROSTAT) 0.4 MG SL tablet Place 1 tablet (0.4 mg total) under the tongue every 5 (five) minutes as needed for chest pain. 25 tablet 12  . PROAIR HFA 108 (90 BASE) MCG/ACT inhaler Inhale 1 puff into the lungs every 4 (four) hours as needed for wheezing or shortness of breath.     . tamsulosin (FLOMAX) 0.4 MG CAPS capsule tamsulosin 0.4 mg capsule  Take 2 capsules every day by oral route after meals for 90 days.    Marland Kitchen tiZANidine (ZANAFLEX) 2 MG tablet tizanidine 2 mg tablet    . triamcinolone ointment (KENALOG) 0.1 % Apply 1 application topically 3 (three) times daily. 30 g 1  . carvedilol (COREG) 6.25 MG tablet Take 1 tablet (6.25 mg total) by mouth 2 (two) times daily with a meal. (Patient not taking: Reported on 12/19/2020) 180 tablet 3  . cholecalciferol (VITAMIN D) 1000 UNITS tablet Take 1,000 Units by mouth daily. (Patient not taking: Reported on 12/19/2020)    . cilostazol (PLETAL) 100 MG tablet 1 tablet 30 minutes before or 2 hours after breakfast and dinner (Patient not taking: Reported on 12/19/2020)    . insulin lispro (HUMALOG) 100 UNIT/ML injection Inject 4 Units into the skin 3 (three) times daily before meals.  (Patient not taking: Reported on 12/19/2020)    . Linagliptin-Metformin HCl 2.5-500 MG TABS Take 1 tablet by mouth 2 (two) times daily. (Patient not taking: Reported on 12/19/2020)    . neomycin-polymyxin-dexamethasone (MAXITROL) 0.1 % ophthalmic suspension neomycin-polymyxin-dexameth 3.5 mg/mL-10,000 unit/mL-0.1% eye drops (Patient not taking: Reported on 12/19/2020)     No current facility-administered medications for this visit.    REVIEW OF SYSTEMS:   [X]  denotes positive  finding, [ ]  denotes negative finding Cardiac  Comments:  Chest pain or chest pressure:    Shortness of breath upon exertion:    Short of breath when lying flat:    Irregular heart rhythm:        Vascular    Pain in calf, thigh, or hip brought on by ambulation: x   Pain in feet at night that wakes you up from your sleep:  Blood clot in your veins:    Leg swelling:         Pulmonary    Oxygen at home:    Productive cough:     Wheezing:         Neurologic    Sudden weakness in arms or legs:     Sudden numbness in arms or legs:     Sudden onset of difficulty speaking or slurred speech:    Temporary loss of vision in one eye:     Problems with dizziness:         Gastrointestinal    Blood in stool:      Vomited blood:         Genitourinary    Burning when urinating:     Blood in urine:        Psychiatric    Major depression:         Hematologic    Bleeding problems:    Problems with blood clotting too easily:        Skin    Rashes or ulcers:        Constitutional    Fever or chills:     PHYSICAL EXAM:   Vitals:   12/19/20 1453  BP: 124/69  Pulse: 83  Temp: 98.3 F (36.8 C)  TempSrc: Skin  SpO2: 98%  Weight: 162 lb 9.6 oz (73.8 kg)    GENERAL: The patient is a well-nourished male, in no acute distress. The vital signs are documented above. CARDIAC: There is a regular rate and rhythm.  VASCULAR: Nonpalpable left pedal pulse PULMONARY: Nonlabored respirations MUSCULOSKELETAL: Right above-knee amputation NEUROLOGIC: No focal weakness or paresthesias are detected. SKIN: There are no ulcers or rashes noted. PSYCHIATRIC: The patient has a normal affect.  STUDIES:   I have reviewed the following: ABI/TBIToday's ABIToday's TBIPrevious ABIPrevious TBI  +-------+-----------+-----------+------------+------------+  Right AKA    AKA                  +-------+-----------+-----------+------------+------------+  Left  0.30     0                   +-------+-----------+-----------+------------+------------+   Left: Occluded left -fem pop bypass graft. No fem-fem graft identified.   ASSESSMENT and PLAN   I discussed with the patient that I would reserve intervention on the left leg for rest pain, nonhealing wounds or infection.  He does not have any of these.  Therefore we will continue to treat him nonsurgically.  I have him scheduled for follow-up in 6 months with repeat ABIs and duplex.  He knows to contact me if he has a change in symptoms.  Prior duplex study show moderate carotid disease.  I will repeat these in 6 months.  Leia Alf, MD, FACS [facility] [phone] Pager [phone]

## 2020-12-20 DIAGNOSIS — M3219 Other organ or system involvement in systemic lupus erythematosus: Secondary | ICD-10-CM | POA: Diagnosis not present

## 2020-12-20 DIAGNOSIS — Z89611 Acquired absence of right leg above knee: Secondary | ICD-10-CM | POA: Diagnosis not present

## 2020-12-20 DIAGNOSIS — E11620 Type 2 diabetes mellitus with diabetic dermatitis: Secondary | ICD-10-CM | POA: Diagnosis not present

## 2020-12-20 DIAGNOSIS — E785 Hyperlipidemia, unspecified: Secondary | ICD-10-CM | POA: Diagnosis not present

## 2020-12-20 DIAGNOSIS — I25119 Atherosclerotic heart disease of native coronary artery with unspecified angina pectoris: Secondary | ICD-10-CM | POA: Diagnosis not present

## 2020-12-20 DIAGNOSIS — E1151 Type 2 diabetes mellitus with diabetic peripheral angiopathy without gangrene: Secondary | ICD-10-CM | POA: Diagnosis not present

## 2020-12-20 DIAGNOSIS — Z794 Long term (current) use of insulin: Secondary | ICD-10-CM | POA: Diagnosis not present

## 2020-12-20 DIAGNOSIS — I10 Essential (primary) hypertension: Secondary | ICD-10-CM | POA: Diagnosis not present

## 2020-12-20 DIAGNOSIS — E1142 Type 2 diabetes mellitus with diabetic polyneuropathy: Secondary | ICD-10-CM | POA: Diagnosis not present

## 2020-12-20 DIAGNOSIS — E1136 Type 2 diabetes mellitus with diabetic cataract: Secondary | ICD-10-CM | POA: Diagnosis not present

## 2020-12-22 ENCOUNTER — Other Ambulatory Visit: Payer: Self-pay

## 2020-12-22 DIAGNOSIS — I70213 Atherosclerosis of native arteries of extremities with intermittent claudication, bilateral legs: Secondary | ICD-10-CM

## 2020-12-22 DIAGNOSIS — I739 Peripheral vascular disease, unspecified: Secondary | ICD-10-CM

## 2020-12-26 NOTE — Progress Notes (Signed)
Cardiology Office Note   Date:  12/27/2020   ID:  Tyler, Whitehead 1941-01-04, MRN [id number]  PCP:  Minus Breeding, MD  Cardiologist:   No primary care provider on file. Referring:  Minus Breeding, MD  Chief Complaint  Patient presents with  . Coronary Artery Disease      History of Present Illness: Tyler Whitehead is a 80 y.o. male who presents for follow up of CAD/CABG.  I saw him last in 2017.  He moved to [place] and is moving back now.  He has CAD he has had CABG and PVD. His last cath [place] suggested that "Medical therapy should be enhanced if the patient has ongoing exertional chest discomfort despite maximal medical management and if he has evidence of inferior ischemia on a perfusion study then repeat PCI of the native right coronary artery could be considered. However, given the patient's track record the right coronary artery is not likely to remain open even if PCI could be accomplished."  He is also managed for lower extremity disease as seen by Dr. Trula Slade.  He has had a CVA of unclear etiology had an implantable loop by Dr. Lovena Le.  Cardiac catheterization on 05/11/2020 in [place] demonstrated 100% left main and mid RCA in-stent lesions.  LIMA to the LAD apparently was patent collaterals to the right coronary artery.  He had a patent vein graft to an OM 1 with a patent stent in the vein graft.  He had an occluded vein graft to the RCA.  He had 100% left SFA occlusion.  He was managed medically.  He did have retrograde cannulation of his left tibial peroneal trunk and had this angioplasty.  Carotid Dopplers demonstrated 40 to 50% stenosis on the left.  Echo demonstrated an EF of 60%.  Since I last saw him he has done very well.  The patient denies any new symptoms such as chest discomfort, neck or arm discomfort. There has been no new shortness of breath, PND or orthopnea. There have been no reported palpitations, presyncope or syncope.   Past Medical History:  Diagnosis  Date  . CAD (coronary artery disease)    a. S/p 3V CABG 2000 with LIMA-LAD, left radial-PDA, SVG-OM1. b. 2014: stent to SVG-OM1; then found to have obstruction distal to LAD anastamosis of LIMA s/p DES.   . CHF (congestive heart failure) ([place])   . Diverticular disease   . DM (diabetes mellitus) ([place])   . Hemorrhoid   . HTN (hypertension)   . Hyperlipidemia   . Peripheral vascular disease ([place])    a. R femoropopliteal bypass ~2012 in [place], reportedly shown to be occluded. Now followed by Dr. [name].  . 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[place])   . RBBB     Past Surgical History:  Procedure Laterality Date  . ABDOMINAL ANGIOGRAM N/A 01/18/2015   Procedure: ABDOMINAL ANGIOGRAM;  Surgeon: Serafina Mitchell, MD;  Location: [facility] CATH LAB;  Service: Cardiovascular;  Laterality: N/A;  . ABDOMINAL AORTAGRAM N/A 09/21/2014   Procedure: ABDOMINAL Maxcine Ham;  Surgeon: Elam Dutch, [place];  Location: [facility] CATH LAB;  Service: Cardiovascular;  Laterality: N/A;  . CARDIAC CATHETERIZATION  May 2014  . CORONARY ARTERY BYPASS GRAFT  2000   LM occluded, LIMA to LAD patent but distal native LAD disease, saphenous vein bypass graft to OM with 95% stenosis in the midportion, right coronary artery occluded, radial artery graft to right coronary artery patent but was diffusely small. Marginal graft stented. 7 2014. Of note  he had multiple previous stents in the right coronary artery. The distal LAD was treated with angioplasty. I  . LOOP RECORDER IMPLANT N/A 07/12/2014   Procedure: LOOP RECORDER IMPLANT;  Surgeon: Evans Lance, MD;  Location: [facility] CATH LAB;  Service: Cardiovascular;  Laterality: N/A;  . LOWER EXTREMITY ANGIOGRAM  09/21/2014   Procedure: LOWER EXTREMITY ANGIOGRAM;  Surgeon: Elam Dutch, MD;  Location[facility] CATH LAB;  Service: Cardiovascular;;  . PERCUTANEOUS CORONARY STENT INTERVENTION (PCI-S)  April 27 2013  . right leg amputation  2017  . TEE WITHOUT CARDIOVERSION N/A 07/12/2014   Procedure: TRANSESOPHAGEAL  ECHOCARDIOGRAM (TEE);  Surgeon: Fay Records, MD;  Location: [facility] ENDOSCOPY;  Service: Cardiovascular;  Laterality: N/A;  . VEIN SURGERY       Current Outpatient Medications  Medication Sig Dispense Refill  . aspirin 81 MG chewable tablet 1 tablet    . atorvastatin (LIPITOR) 40 MG tablet 1 tablet    . carvedilol (COREG) 3.125 MG tablet 1 tablet with food    . chlorpheniramine-HYDROcodone (TUSSIONEX) 10-8 MG/5ML SUER 5 ml as needed    . cholecalciferol (VITAMIN D) 1000 UNITS tablet Take 1,000 Units by mouth daily.    . cilostazol (PLETAL) 100 MG tablet     . diclofenac Sodium (VOLTAREN) 1 % GEL Voltaren 1 % topical gel    . fluticasone (FLONASE) 50 MCG/ACT nasal spray Place 2 sprays into both nostrils as directed.    . gabapentin (NEURONTIN) 300 MG capsule 1 capsule    . hydroxyurea (HYDREA) 500 MG capsule Take 500 mg by mouth daily. Take 2 tablets in the am and 1 tablet in the pm. May take with food to minimize GI side effects.    . insulin glargine (LANTUS) 100 UNIT/ML injection Inject 22 Units into the skin at bedtime.    . insulin lispro (HUMALOG) 100 UNIT/ML injection Inject 4 Units into the skin 3 (three) times daily before meals.    . isosorbide mononitrate (IMDUR) 60 MG 24 hr tablet Take 1-2 tablets (60-120 mg total) by mouth 2 (two) times daily. 2 in the morning and 1 in the evening 270 tablet 3  . levocetirizine (XYZAL) 5 MG tablet 1 tablet in the evening    . Linagliptin-Metformin HCl 2.5-500 MG TABS Take 1 tablet by mouth 2 (two) times daily.    Marland Kitchen losartan (COZAAR) 25 MG tablet 1 tablet    . neomycin-polymyxin-dexamethasone (MAXITROL) 0.1 % ophthalmic suspension     . nitroGLYCERIN (NITROSTAT) 0.4 MG SL tablet Place 1 tablet (0.4 mg total) under the tongue every 5 (five) minutes as needed for chest pain. 25 tablet 12  . PROAIR HFA 108 (90 BASE) MCG/ACT inhaler Inhale 1 puff into the lungs every 4 (four) hours as needed for wheezing or shortness of breath.     . tamsulosin (FLOMAX)  0.4 MG CAPS capsule tamsulosin 0.4 mg capsule  Take 2 capsules every day by oral route after meals for 90 days.    Marland Kitchen tiZANidine (ZANAFLEX) 2 MG tablet tizanidine 2 mg tablet    . triamcinolone ointment (KENALOG) 0.1 % Apply 1 application topically 3 (three) times daily. 30 g 1   No current facility-administered medications for this visit.    Allergies:   Patient has no known allergies.    ROS:  Please see the history of present illness.   Otherwise, review of systems are positive for none.   All other systems are reviewed and negative.    PHYSICAL EXAM: VS:  BP 124/72   Pulse 85   Ht 5\' 7"  (1.702 m)   Wt 162 lb 3.2 oz (73.6 kg)   SpO2 99%   BMI 25.40 kg/m  , BMI Body mass index is 25.4 kg/m. GENERAL:  Well appearing NECK:  No jugular venous distention, waveform within normal limits, carotid upstroke brisk and symmetric, no bruits, no thyromegaly LUNGS:  Clear to auscultation bilaterally CHEST:  Well healed sternotomy scar. HEART:  PMI not displaced or sustained,S1 and S2 within normal limits, no S3, no S4, no clicks, no rubs, no murmurs ABD:  Flat, positive bowel sounds normal in frequency in pitch, no bruits, no rebound, no guarding, no midline pulsatile mass, no hepatomegaly, no splenomegaly EXT:  2 plus pulses upper pulses.  No DP/PT.  Status post right AKA.     EKG:  EKG is not ordered today.   Recent Labs: 09/15/2020: ALT 10; BUN 14; Creatinine 1.16; Potassium 5.3; Sodium 144 12/15/2020: Hemoglobin 11.3; Platelet Count 493    Lipid Panel    Component Value Date/Time   CHOL 94 07/11/2014 0430   TRIG 101 07/11/2014 0430   HDL 34 (L) 07/11/2014 0430   CHOLHDL 2.8 07/11/2014 0430   VLDL 20 07/11/2014 0430   LDLCALC 40 07/11/2014 0430      Wt Readings from Last 3 Encounters:  12/27/20 162 lb 3.2 oz (73.6 kg)  12/19/20 162 lb 9.6 oz (73.8 kg)  12/15/20 162 lb (73.5 kg)      Other studies Reviewed: Additional studies/ records that were reviewed today include:  Labs.  None Review of the above records demonstrates:  Please see elsewhere in the note.     ASSESSMENT AND PLAN:  CAD/CABG:    The patient has no new sypmtoms.  No further cardiovascular testing is indicated.  We will continue with aggressive risk reduction and meds as listed.  HTN:  The blood pressure is at target.  No change in therapy.   DM:    A1c is 5.8.  No change in therapy   PVD:   He is followed by Dr. Trula Slade and he has no nonhealing ulcers or resting pain.   HYPERLIPIDEMIA:  His LDL was 76 with an HDL of 26.  No change in therapy.   Current medicines are reviewed at length with the patient today.  The patient does not have concerns regarding medicines.  The following changes have been made:  None  Labs/ tests ordered today include: None  No orders of the defined types were placed in this encounter.   Disposition:   FU with me in 12 months.     Signed, Minus Breeding, MD  12/27/2020 3:52 PM    [facility]

## 2020-12-27 ENCOUNTER — Ambulatory Visit (INDEPENDENT_AMBULATORY_CARE_PROVIDER_SITE_OTHER): Payer: Medicare HMO | Admitting: Cardiology

## 2020-12-27 ENCOUNTER — Other Ambulatory Visit: Payer: Self-pay

## 2020-12-27 ENCOUNTER — Encounter: Payer: Self-pay | Admitting: Cardiology

## 2020-12-27 VITALS — BP 124/72 | HR 85 | Ht 67.0 in | Wt 162.2 lb

## 2020-12-27 DIAGNOSIS — I2510 Atherosclerotic heart disease of native coronary artery without angina pectoris: Secondary | ICD-10-CM | POA: Diagnosis not present

## 2020-12-27 DIAGNOSIS — I739 Peripheral vascular disease, unspecified: Secondary | ICD-10-CM

## 2020-12-27 DIAGNOSIS — I10 Essential (primary) hypertension: Secondary | ICD-10-CM

## 2020-12-27 DIAGNOSIS — E785 Hyperlipidemia, unspecified: Secondary | ICD-10-CM

## 2020-12-27 NOTE — Patient Instructions (Signed)
Medication Instructions:  The current medical regimen is effective;  continue present plan and medications as directed. Please refer to the Current Medication list given to you today.  *If you need a refill on your cardiac medications before your next appointment, please call your pharmacy*  Lab Work:   Testing/Procedures:  NONE    NON  Follow-Up: Your next appointment:  12 month(s) In Person with You may see Minus Breeding, MD or one of the following Advanced Practice Providers on your designated Care Team:  Rosaria Ferries, PA-C Jory Sims, DNP, ANP  Please call our office 2 months in advance to schedule this appointment   At [facility], you and your health needs are our priority.  As part of our continuing mission to provide you with exceptional heart care, we have created designated Provider Care Teams.  These Care Teams include your primary Cardiologist (physician) and Advanced Practice Providers (APPs -  Physician Assistants and Nurse Practitioners) who all work together to provide you with the care you need, when you need it.  We recommend signing up for the patient portal called "MyChart".  Sign up information is provided on this After Visit Summary.  MyChart is used to connect with patients for Virtual Visits (Telemedicine).  Patients are able to view lab/test results, encounter notes, upcoming appointments, etc.  Non-urgent messages can be sent to your provider as well.   To learn more about what you can do with MyChart, go to [email or web address].

## 2021-01-03 DIAGNOSIS — Z89619 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: Secondary | ICD-10-CM | POA: Diagnosis not present

## 2021-01-03 DIAGNOSIS — Z951 Presence of aortocoronary bypass graft: Secondary | ICD-10-CM | POA: Diagnosis not present

## 2021-01-03 DIAGNOSIS — D471 Chronic myeloproliferative disease: Secondary | ICD-10-CM | POA: Diagnosis not present

## 2021-01-03 DIAGNOSIS — I10 Essential (primary) hypertension: Secondary | ICD-10-CM | POA: Diagnosis not present

## 2021-01-03 DIAGNOSIS — Z8673 Personal history of transient ischemic attack (TIA), and cerebral infarction without residual deficits: Secondary | ICD-10-CM | POA: Diagnosis not present

## 2021-01-03 DIAGNOSIS — Z23 Encounter for immunization: Secondary | ICD-10-CM | POA: Diagnosis not present

## 2021-01-03 DIAGNOSIS — I739 Peripheral vascular disease, unspecified: Secondary | ICD-10-CM | POA: Diagnosis not present

## 2021-01-03 DIAGNOSIS — I25709 Atherosclerosis of coronary artery bypass graft(s), unspecified, with unspecified angina pectoris: Secondary | ICD-10-CM | POA: Diagnosis not present

## 2021-01-03 DIAGNOSIS — E1140 Type 2 diabetes mellitus with diabetic neuropathy, unspecified: Secondary | ICD-10-CM | POA: Diagnosis not present

## 2021-01-03 DIAGNOSIS — E785 Hyperlipidemia, unspecified: Secondary | ICD-10-CM | POA: Diagnosis not present

## 2021-01-03 DIAGNOSIS — H538 Other visual disturbances: Secondary | ICD-10-CM | POA: Diagnosis not present

## 2021-01-16 ENCOUNTER — Ambulatory Visit (INDEPENDENT_AMBULATORY_CARE_PROVIDER_SITE_OTHER): Payer: Medicare HMO | Admitting: Podiatry

## 2021-01-16 ENCOUNTER — Other Ambulatory Visit: Payer: Self-pay

## 2021-01-16 DIAGNOSIS — E1140 Type 2 diabetes mellitus with diabetic neuropathy, unspecified: Secondary | ICD-10-CM | POA: Diagnosis not present

## 2021-01-16 DIAGNOSIS — Z89611 Acquired absence of right leg above knee: Secondary | ICD-10-CM

## 2021-01-16 DIAGNOSIS — L84 Corns and callosities: Secondary | ICD-10-CM | POA: Diagnosis not present

## 2021-01-16 DIAGNOSIS — Z89431 Acquired absence of right foot: Secondary | ICD-10-CM | POA: Diagnosis not present

## 2021-01-16 DIAGNOSIS — E1151 Type 2 diabetes mellitus with diabetic peripheral angiopathy without gangrene: Secondary | ICD-10-CM

## 2021-01-16 NOTE — Progress Notes (Signed)
The patient presented to the office to day to pick up diabetic shoes and 3 pair diabetic custom inserts.  1 pair of inserts were put in the shoes and the shoes were fitted to the patient. The patient states they are comfortable and free of defect. He was satisfied with the fit of the shoe. Instructions for break in and wear were dispensed. The patient signed the delivery documentation and break in instruction form.  If any concerns or questions arise, he is instructed to call 

## 2021-02-14 ENCOUNTER — Other Ambulatory Visit: Payer: Self-pay

## 2021-02-14 ENCOUNTER — Inpatient Hospital Stay: Payer: Medicare HMO | Attending: Nurse Practitioner

## 2021-02-14 DIAGNOSIS — D7581 Myelofibrosis: Secondary | ICD-10-CM

## 2021-02-14 DIAGNOSIS — D469 Myelodysplastic syndrome, unspecified: Secondary | ICD-10-CM | POA: Insufficient documentation

## 2021-02-14 LAB — CBC WITH DIFFERENTIAL (CANCER CENTER ONLY)
Abs Immature Granulocytes: 0.04 10*3/uL (ref 0.00–0.07)
Basophils Absolute: 0.1 10*3/uL (ref 0.0–0.1)
Basophils Relative: 1 %
Eosinophils Absolute: 0.1 10*3/uL (ref 0.0–0.5)
Eosinophils Relative: 1 %
HCT: 29.3 % — ABNORMAL LOW (ref 39.0–52.0)
Hemoglobin: 9.8 g/dL — ABNORMAL LOW (ref 13.0–17.0)
Immature Granulocytes: 1 %
Lymphocytes Relative: 17 %
Lymphs Abs: 0.7 10*3/uL (ref 0.7–4.0)
MCH: 38.4 pg — ABNORMAL HIGH (ref 26.0–34.0)
MCHC: 33.4 g/dL (ref 30.0–36.0)
MCV: 114.9 fL — ABNORMAL HIGH (ref 80.0–100.0)
Monocytes Absolute: 0.3 10*3/uL (ref 0.1–1.0)
Monocytes Relative: 8 %
Neutro Abs: 2.8 10*3/uL (ref 1.7–7.7)
Neutrophils Relative %: 72 %
Platelet Count: 306 10*3/uL (ref 150–400)
RBC: 2.55 MIL/uL — ABNORMAL LOW (ref 4.22–5.81)
RDW: 19.6 % — ABNORMAL HIGH (ref 11.5–15.5)
WBC Count: 3.9 10*3/uL — ABNORMAL LOW (ref 4.0–10.5)
nRBC: 0 % (ref 0.0–0.2)

## 2021-02-20 ENCOUNTER — Ambulatory Visit (INDEPENDENT_AMBULATORY_CARE_PROVIDER_SITE_OTHER): Payer: Medicare HMO | Admitting: Podiatry

## 2021-02-20 ENCOUNTER — Ambulatory Visit (INDEPENDENT_AMBULATORY_CARE_PROVIDER_SITE_OTHER): Payer: Medicare HMO

## 2021-02-20 ENCOUNTER — Other Ambulatory Visit: Payer: Self-pay

## 2021-02-20 ENCOUNTER — Encounter: Payer: Self-pay | Admitting: Podiatry

## 2021-02-20 ENCOUNTER — Other Ambulatory Visit: Payer: Self-pay | Admitting: Podiatry

## 2021-02-20 DIAGNOSIS — B351 Tinea unguium: Secondary | ICD-10-CM | POA: Diagnosis not present

## 2021-02-20 DIAGNOSIS — M869 Osteomyelitis, unspecified: Secondary | ICD-10-CM

## 2021-02-20 DIAGNOSIS — E1151 Type 2 diabetes mellitus with diabetic peripheral angiopathy without gangrene: Secondary | ICD-10-CM

## 2021-02-20 DIAGNOSIS — Z89611 Acquired absence of right leg above knee: Secondary | ICD-10-CM

## 2021-02-20 MED ORDER — MUPIROCIN 2 % EX OINT: TOPICAL_OINTMENT | CUTANEOUS | 1 refills | Status: DC

## 2021-02-20 MED ORDER — DOXYCYCLINE HYCLATE 100 MG PO CAPS: 100.0000 mg | ORAL_CAPSULE | Freq: Two times a day (BID) | ORAL | 0 refills | Status: AC

## 2021-02-20 NOTE — Progress Notes (Signed)
Subjective:  Patient ID: Tyler Whitehead, male    DOB: Apr 06, 1941,  MRN: [id number]  80 y.o. male is seen today for evaluation of L hallux.  Patient presents to clinic today c/o left hallux pain for the past two weeks.  States he called office and was not able to get an appointment. He relates he noticed drainage in the corner of his left great toe. He cleaned the digit with alcohol and applied Aspercreme to digit.  He has h/o AKA RLE and severe PAD and is followed closely by Vascular. Has had bypass on LLE. ABIs performed in March.  Patient The patient is having no constitutional symptoms, denying fever, chills, anorexia, or weight loss..  Current Outpatient Medications on File Prior to Visit  Medication Sig Dispense Refill  . Lancets Ultra Thin MISC Delica    . aspirin 81 MG chewable tablet 1 tablet    . atorvastatin (LIPITOR) 40 MG tablet 1 tablet    . carvedilol (COREG) 3.125 MG tablet 1 tablet with food    . chlorpheniramine-HYDROcodone (TUSSIONEX) 10-8 MG/5ML SUER 5 ml as needed    . cholecalciferol (VITAMIN D) 1000 UNITS tablet Take 1,000 Units by mouth daily.    . cilostazol (PLETAL) 100 MG tablet     . COVID-19 mRNA vaccine, Moderna, 100 MCG/0.5ML injection USE AS DIRECTED .25 mL 0  . diclofenac Sodium (VOLTAREN) 1 % GEL Voltaren 1 % topical gel    . fluticasone (FLONASE) 50 MCG/ACT nasal spray Place 2 sprays into both nostrils as directed.    . gabapentin (NEURONTIN) 300 MG capsule 1 capsule    . hydroxyurea (HYDREA) 500 MG capsule Take 500 mg by mouth daily. Take 2 tablets in the am and 1 tablet in the pm. May take with food to minimize GI side effects.    . insulin glargine (LANTUS) 100 UNIT/ML injection Inject 22 Units into the skin at bedtime.    . insulin lispro (HUMALOG) 100 UNIT/ML injection Inject 4 Units into the skin 3 (three) times daily before meals.    . isosorbide mononitrate (IMDUR) 60 MG 24 hr tablet Take 1-2 tablets (60-120 mg total) by mouth 2 (two) times daily.  2 in the morning and 1 in the evening 270 tablet 3  . Lancets (ONETOUCH DELICA PLUS [id number]) MISC Apply topically 2 (two) times daily.    Marland Kitchen levocetirizine (XYZAL) 5 MG tablet 1 tablet in the evening    . Linagliptin-Metformin HCl 2.5-500 MG TABS Take 1 tablet by mouth 2 (two) times daily.    Marland Kitchen losartan (COZAAR) 25 MG tablet 1 tablet    . neomycin-polymyxin-dexamethasone (MAXITROL) 0.1 % ophthalmic suspension     . nitroGLYCERIN (NITROSTAT) 0.4 MG SL tablet Place 1 tablet (0.4 mg total) under the tongue every 5 (five) minutes as needed for chest pain. 25 tablet 12  . ONETOUCH VERIO test strip 1 each 2 (two) times daily.    Marland Kitchen PROAIR HFA 108 (90 BASE) MCG/ACT inhaler Inhale 1 puff into the lungs every 4 (four) hours as needed for wheezing or shortness of breath.     . tamsulosin (FLOMAX) 0.4 MG CAPS capsule tamsulosin 0.4 mg capsule  Take 2 capsules every day by oral route after meals for 90 days.    Marland Kitchen tiZANidine (ZANAFLEX) 2 MG tablet tizanidine 2 mg tablet    . TRESIBA 100 UNIT/ML SOLN Inject into the skin.    Marland Kitchen triamcinolone ointment (KENALOG) 0.1 % Apply 1 application topically 3 (three) times daily. 30 g  1   No current facility-administered medications on file prior to visit.     No Known Allergies   Objective:  Physical Exam: There were no vitals filed for this visit.   Tyler Whitehead is a Tyler 80 y.o. African American male WD, WN in NAD. AAO x 3.  Vascular Examination: Capillary fill time to digits delayed left lower extremity. Nonpalpable DP pulse(s) left lower extremity. Nonpalpable PT pulse(s) left lower extremity. Pedal hair absent. Lower extremity skin temperature gradient warm to cool LLE. No pain with calf compression LLE. +1 pitting edema left lower extremity.  Dermatological Examination: Pedal skin is thin shiny, atrophic left lower extremity. No interdigital maceration LLE.  Left great toe with localized abscess and <1/2 cc creamy white purulent drainage expressed  followed by blood. No odor.   Anonychia left 2nd digit.  Wound Location: L hallux There is a minimal amount of devitalized tissue present in the wound. Wound Measurement: 0.2 x 0.2 x 0.3 cm and palpable to bone. Peri-wound: Normal Exudate: Scant/small amount creamy white purulent exudate Blood Loss during debridement: < 1/2 cc('s). Sign(s) of clinical bacterial infection: purulent drainage and abscess  Musculoskeletal Examination: Muscle strength 5/5 to all LE muscle groups of left lower extremity. Lower extremity amputation(s): above knee amputation right lower extremity. Utilizes walker for ambulation assistance.  Neurological Examination: Protective sensation intact with 10 gram monofilament left lower extremity.  Labs: Lab Results  Component Value Date   WBC 3.9 (L) 02/14/2021   HGB 9.8 (L) 02/14/2021   HCT 29.3 (L) 02/14/2021   MCV 114.9 (H) 02/14/2021   PLT 306 02/14/2021   Radiographs Left foot:  No gas in tissues left foot. Bone erosion noted L hallux, early at distal tuft of digit.  Vascular ABIs on 12/19/2020: Left: 0.30  Occluded graft left leg  Assessment:   1. Osteomyelitis of great toe of left foot ([place])   2. Type II diabetes mellitus with peripheral circulatory disorder (HCC)   3. 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  Dispense: 30 g; Refill: 1 - doxycycline (VIBRAMYCIN) 100 MG capsule; Take 1 capsule (100 mg total) by mouth 2 (two) times daily for 10 days.  Dispense: 20 capsule; Refill: 0 - WOUND CULTURE   Plan:  -Patient was evaluated and treated and all questions answered.  -Patient educated on diagnosis and treatment plan. -Culture and sensitivity taken of left hallux today. -Evacuated abscess and irrigated with hydrogen peroxide. TAO and light dressing applied to left hallux.  -Today's ulcer size post-debridement: 0.2  x 0.2 x 0.3 cm. -Wound responded well to today's debridement. -Patient risk factors affecting healing of ulcer: diabetes, PAD, history of prior amputation -Tyler Whitehead given written instructions on daily wound care for L hallux ulceration. -Rx for Doxycyline 100 mg, #20, to be taken twice daily for 10 days.  -Rx for Mupirocin Ointment to be applied to left great toe once daily until seen by Dr. Jacqualyn Posey. -Dispensed surgical shoe for left foot. -Consultations ordered today: Infectious Disease. -Labs ordered today: wound culture: left hallux. -Remaining toenails 3-5 left foot debrided in length and girth without incident. -Patient to report any pedal injuries to medical professional immediately. -Patient referred to Dr. Celesta Gentile for follow-up of osteomyelitis left hallux -Will cc Vascular Team on case. -Patient/POA to call should there be question/concern in the interim.  Return in  about 9 weeks (around 04/24/2021).  Marzetta Board, DPM

## 2021-02-20 NOTE — Patient Instructions (Addendum)
DRESSING CHANGES L hallux:   PHARMACY SHOPPING LIST: 1. Saline or Wound Cleanser for cleaning wound 2. 2 x 2 inch sterile gauze for cleaning wound 3. Mupirocin Ointment (prescription) 4. Fabric band-aid  A. IF DISPENSED, WEAR SURGICAL SHOE OR WALKING BOOT AT ALL TIMES.  B. IF PRESCRIBED ORAL ANTIBIOTICS, TAKE ALL MEDICATION AS PRESCRIBED UNTIL ALL ARE GONE.  1. KEEP left foot DRY AT ALL TIMES!!!!  2. CLEANSE ULCER WITH SALINE OR WOUND CLEANSER.  3. DAB DRY WITH GAUZE SPONGE.  4. APPLY A LIGHT AMOUNT OF Mupirocin Ointment TO BASE OF ULCER.  5. APPLY OUTER DRESSING AS INSTRUCTED.  6. WEAR SURGICAL SHOE/BOOT DAILY AT ALL TIMES. IF SUPPLIED, WEAR HEEL PROTECTORS AT ALL TIMES WHEN IN BED.  7. DO NOT WALK BAREFOOT!!!  8.  IF YOU EXPERIENCE ANY FEVER, CHILLS, NIGHTSWEATS, NAUSEA OR VOMITING, ELEVATED OR LOW BLOOD SUGARS, REPORT TO EMERGENCY ROOM.  9. IF YOU EXPERIENCE INCREASED REDNESS, PAIN, SWELLING, DISCOLORATION, ODOR, PUS, DRAINAGE OR WARMTH OF YOUR FOOT, REPORT TO EMERGENCY ROOM.

## 2021-02-22 ENCOUNTER — Other Ambulatory Visit: Payer: Self-pay

## 2021-02-22 ENCOUNTER — Ambulatory Visit: Payer: Medicare HMO | Admitting: Internal Medicine

## 2021-02-22 ENCOUNTER — Encounter: Payer: Self-pay | Admitting: Internal Medicine

## 2021-02-22 VITALS — BP 97/55 | HR 98 | Temp 98.4°F | Ht 67.0 in | Wt 164.0 lb

## 2021-02-22 DIAGNOSIS — E11628 Type 2 diabetes mellitus with other skin complications: Secondary | ICD-10-CM

## 2021-02-22 DIAGNOSIS — L089 Local infection of the skin and subcutaneous tissue, unspecified: Secondary | ICD-10-CM

## 2021-02-22 MED ORDER — CEFPODOXIME PROXETIL 200 MG PO TABS: 200.0000 mg | ORAL_TABLET | Freq: Two times a day (BID) | ORAL | 0 refills | Status: AC

## 2021-02-22 MED ORDER — DOXYCYCLINE HYCLATE 100 MG PO TABS: 100.0000 mg | ORAL_TABLET | Freq: Two times a day (BID) | ORAL | 0 refills | Status: AC

## 2021-02-22 NOTE — Patient Instructions (Addendum)
Lets finish 2 weeks of antibiotics for your foot infection until 03/09/2021  I have prescribed both antibiotics below  1) continue doxycycline 100 mg pill twice a day (a new prescription for 1 week) 2) I will add cefpodoxime twice a day as well (a new prescription for 2 weeks)  Stop the topical ointment (mupirocin) Continue wound care as per podiatry  See me in 2-3 weeks     Blood test today  Thank you

## 2021-02-22 NOTE — Progress Notes (Signed)
[facility] for Infectious Disease  Reason for Consult:diabetic foot infection, left Referring Provider: [place]    Patient Active Problem List   Diagnosis Date Noted  . 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 Allergic rhinitis 11/14/2020  . Chronic constipation 11/14/2020  . Depression 11/14/2020  . Diabetic peripheral neuropathy associated with type 2 diabetes mellitus ([place]) 11/14/2020  . Dysphagia 11/14/2020  . Hyperlipidemia 11/14/2020  . Leukocytosis 11/14/2020  . Nausea and vomiting 11/14/2020  . Presence of aortocoronary bypass graft 11/14/2020  . Pure hypercholesterolemia 11/14/2020  . Vitamin D deficiency 11/14/2020  . Cryptogenic stroke ([place]) 07/20/2015  . History of stroke 07/06/2015  . Transient vision disturbance, right 07/06/2015  . Atherosclerosis of native arteries of extremity with intermittent claudication ([place]) 09/27/2014  . Localization-related symptomatic epilepsy and epileptic syndromes with complex partial seizures, not intractable, without status epilepticus ([place]) 08/04/2014  . Occipital stroke ([place]) 08/04/2014  . Cerebral thrombosis with cerebral infarction ([place]) 07/10/2014  . New onset seizure ([place]) 07/09/2014  . Diabetic hyperosmolar non-ketotic state ([place]) 07/09/2014  . Uncontrolled hypertension 07/09/2014  . Polycythemia vera ([place]) 07/09/2014  . Seizure ([place]) 07/09/2014  . Visual field defect 07/09/2014  . Headache 06/30/2014  . HTN (hypertension) 11/23/2013  . Dyslipidemia 11/23/2013  . Myelofibrosis ([place]) 11/22/2013  . Diverticular disease 11/22/2013  . Hemorrhoids 11/22/2013  . 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 CAD (coronary artery disease) 11/22/2013  . Peripheral vascular disease ([place]) 11/22/2013  . DM2 (diabetes mellitus, type 2) ([place]) 11/22/2013  . Renal cyst, left 11/22/2013  . Splenomegaly 11/22/2013      HPI: Tyler Whitehead is a 80 y.o. male nonsmoking pmh dm2, s/p right aka, pvd, hlp/htn,  cad s/p cabg, referred by podiatry here for comanagement left hallux OM  Reviewed chart  He has been seeing podiatry since 01/2021 for diabetic foot care. He saw them on 5/16 for 2 weeks left hallux pain and 2 days of discharge. He had had his nail trimmed/cared for by podiatry and he thinks one time the trimming triggered the process  He had PVD and had hx bypass in LLE by vascular surgery 1 year prior to this visit 02/2021. Had repeat abi 12/2020 and that showed severe disease. He had chronic LLE swelling/edema from the bypass  No f/c  Was given mupirocin ointment and doxycycline along with wound cx on 5/16 at the podiatry clinic. Also had bedside I&D done of the left hallux during 5/16 visit    I don't have any culture data  Of note, he also has been recently dx'ed with myelofibrosis    Review of Systems: ROS All other ros negative     Past Medical History:  Diagnosis Date  . CAD (coronary artery disease)    a. S/p 3V CABG 2000 with LIMA-LAD, left radial-PDA, SVG-OM1. b. 2014: stent to SVG-OM1; then found to have obstruction distal to LAD anastamosis of LIMA s/p DES.   . CHF (congestive heart failure) ([place])   . Diverticular disease   . DM (diabetes mellitus) ([place])   . Hemorrhoid   . HTN (hypertension)   . Hyperlipidemia   . Peripheral vascular disease ([place])    a. R femoropopliteal bypass ~2012 in [place], reportedly shown to be occluded. Now followed by Dr. [name].  . 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[place])   . RBBB     Social History   Tobacco Use  . Smoking status: Never Smoker  . Smokeless tobacco: Never  Used  Substance Use Topics  . Alcohol use: Yes    Alcohol/week: 0.0 standard drinks    Comment: sometimes   . Drug use: No    Family History  Problem Relation Age of Onset  . Asthma Father   . Heart disease Father   . Hyperlipidemia Maternal Grandmother   . CAD Other        Multiple siblings  . Cancer Other        niece had lung cancer  . Cancer Paternal Uncle         brain cancer     No Known Allergies  OBJECTIVE: Vitals:   02/22/21 1358  BP: (!) 97/55  Pulse: 98  Temp: 98.4 F (36.9 C)  TempSrc: Oral  SpO2: 97%  Weight: 164 lb (74.4 kg)  Height: 5\' 7"  (1.702 m)   Body mass index is 25.69 kg/m.   Physical Exam General/constitutional: no distress, pleasant HEENT: Normocephalic, PER, Conj Clear, EOMI, Oropharynx clear Neck supple CV: rrr no mrg Lungs: clear to auscultation, normal respiratory effort Abd: Soft, Nontender Ext: no edema Skin: left hallux nailed trimmed/shaved; I couldn't express any discharge from the wound bed which look clean Neuro: nonfocal MSK: no peripheral joint swelling/tenderness/warmth; back spines nontender Psych alert/oriented       Lab: Lab Results  Component Value Date   WBC 3.9 (L) 02/14/2021   HGB 9.8 (L) 02/14/2021   HCT 29.3 (L) 02/14/2021   MCV 114.9 (H) 02/14/2021   PLT 306 78/24/2353   Last metabolic panel Lab Results  Component Value Date   GLUCOSE 149 (H) 09/15/2020   NA 144 09/15/2020   K 5.3 (H) 09/15/2020   CL 108 09/15/2020   CO2 30 09/15/2020   BUN 14 09/15/2020   CREATININE 1.16 09/15/2020   GFRNONAA >60 09/15/2020   GFRAA >60 02/16/2015   CALCIUM 9.4 09/15/2020   PROT 6.3 (L) 09/15/2020   ALBUMIN 3.6 09/15/2020   BILITOT 0.5 09/15/2020   ALKPHOS 106 09/15/2020   AST 15 09/15/2020   ALT 10 09/15/2020   ANIONGAP 6 09/15/2020    Microbiology:  Serology:  Imaging: 12/19/20 left LE abi Left: Resting left ankle-brachial index indicates severe left lower  extremity arterial disease. The left toe-brachial index is abnormal. LT  Great toe pressure = 0 mmHg.    5/16 xray left foot No report available I reviewed and no obvious osseous infection sign found   Assessment/plan: Problem List Items Addressed This Visit   None   Visit Diagnoses    Diabetic foot infection ([place])    -  Primary   Relevant Medications   cefpodoxime (VANTIN) 200 MG tablet   Other  Relevant Orders   C-reactive protein   Comprehensive metabolic panel      I reviewed history/objective and exam finding. Appears he likely has superficial soft tissue infection.   Discussed natural process of diabetic foot infection and risk of recurrence  At this time, treating 14 days for soft tissue infection and monitoring for progression of disease   -continue doxycycline for 2 weeks until 6/01 -avoid bactroban; I generally do not Whitehead evidence of benefit of topical abx outside of more contact dermatitis side effect -add cefpodoxime for 2 weeks 6/01 -f/u with me in 2 weeks      Follow-up: Return in about 3 weeks (around 03/15/2021).  I spent 60 minute reviewing data/chart, and coordinating care and >50% direct face to face time providing counseling/discussing diagnostics/treatment plan with patient   Rockey Situ  [place], [facility] for [facility] [phone] pager   [phone] cell 02/22/2021, 2:14 PM

## 2021-02-23 ENCOUNTER — Telehealth: Payer: Self-pay

## 2021-02-23 LAB — WOUND CULTURE
MICRO NUMBER:: 11894670
SPECIMEN QUALITY:: ADEQUATE

## 2021-02-23 LAB — COMPREHENSIVE METABOLIC PANEL
AG Ratio: 1.8 (calc) (ref 1.0–2.5)
ALT: 11 U/L (ref 9–46)
AST: 11 U/L (ref 10–35)
Albumin: 3.6 g/dL (ref 3.6–5.1)
Alkaline phosphatase (APISO): 77 U/L (ref 35–144)
BUN: 22 mg/dL (ref 7–25)
CO2: 27 mmol/L (ref 20–32)
Calcium: 8.6 mg/dL (ref 8.6–10.3)
Chloride: 109 mmol/L (ref 98–110)
Creat: 1.14 mg/dL (ref 0.70–1.18)
Globulin: 2 g/dL (calc) (ref 1.9–3.7)
Glucose, Bld: 217 mg/dL — ABNORMAL HIGH (ref 65–99)
Potassium: 5 mmol/L (ref 3.5–5.3)
Sodium: 141 mmol/L (ref 135–146)
Total Bilirubin: 0.3 mg/dL (ref 0.2–1.2)
Total Protein: 5.6 g/dL — ABNORMAL LOW (ref 6.1–8.1)

## 2021-02-23 LAB — HOUSE ACCOUNT TRACKING

## 2021-02-23 LAB — C-REACTIVE PROTEIN: CRP: 3.7 mg/L (ref <8.0)

## 2021-02-23 NOTE — Progress Notes (Signed)
We get this message from [place] when sending cultures. Please advise. Thanks!

## 2021-02-23 NOTE — Telephone Encounter (Signed)
I spoke with Mr Zinda. I reviewed Dr Ernestina Penna comments and recommendations.  Mr Flanigan verbalized understanding.  I reviewed his next appt with [place].

## 2021-02-23 NOTE — Telephone Encounter (Signed)
-----   Message from Truitt Merle, MD sent at 02/21/2021  4:17 PM EDT ----- Please let pt know his lab result. Plt is normal now, anemia slightly worse, he is on hydrea 1000mg  am and 500mg  pm daily now, please let him skip pm dose Hydra on MWF, thanks   Truitt Merle  02/21/2021

## 2021-02-27 ENCOUNTER — Telehealth: Payer: Self-pay

## 2021-02-27 NOTE — Telephone Encounter (Signed)
Made in error

## 2021-02-28 ENCOUNTER — Ambulatory Visit: Payer: Medicare HMO | Admitting: Podiatry

## 2021-02-28 ENCOUNTER — Ambulatory Visit (INDEPENDENT_AMBULATORY_CARE_PROVIDER_SITE_OTHER): Payer: Medicare HMO

## 2021-02-28 ENCOUNTER — Other Ambulatory Visit: Payer: Self-pay

## 2021-02-28 DIAGNOSIS — E1151 Type 2 diabetes mellitus with diabetic peripheral angiopathy without gangrene: Secondary | ICD-10-CM | POA: Diagnosis not present

## 2021-02-28 DIAGNOSIS — I739 Peripheral vascular disease, unspecified: Secondary | ICD-10-CM

## 2021-02-28 DIAGNOSIS — M869 Osteomyelitis, unspecified: Secondary | ICD-10-CM

## 2021-02-28 NOTE — Progress Notes (Signed)
Subjective: 80 year old male presents the office today with a family member for evaluation of left hallux ulcer, concern for osteomyelitis.  He was last seen with Dr. Adah Perl.  He has since followed up with infectious disease and is currently still on antibiotics.  He states that he no longer has any drainage coming from the toe and he has not missed any swelling or redness.  He is concerned with the discoloration to his left foot. Denies any systemic complaints such as fevers, chills, nausea, vomiting. No acute changes since last appointment, and no other complaints at this time.   Objective: AAO x3, NAD DP/PT pulses decreased  On the medial aspect of the nailbed of the hallux on the left side superficial area skin breakdown.  There is no drainage or pus identified today there is no probing.  Minimal edema.  There is no erythema there is no warmth.  There is dark discoloration to his entire forefoot.  His color changes been chronic.  He has seen vascular surgery previously. No pain with calf compression, swelling, warmth, erythema      Assessment: Toe wound left foot  Plan: -All treatment options discussed with the patient including all alternatives, risks, complications.  -X-rays obtained and reviewed.  Bone appears to be stable compared to prior. -Continue antibiotics per infectious disease.  Recommend vascular surgery evaluation as well. -Continue daily dressing changes, offloading. -Patient encouraged to call the office with any questions, concerns, change in symptoms.   Trula Slade DPM

## 2021-03-13 ENCOUNTER — Ambulatory Visit: Payer: Medicare HMO | Admitting: Podiatry

## 2021-03-13 ENCOUNTER — Other Ambulatory Visit: Payer: Self-pay

## 2021-03-13 DIAGNOSIS — E1151 Type 2 diabetes mellitus with diabetic peripheral angiopathy without gangrene: Secondary | ICD-10-CM

## 2021-03-13 DIAGNOSIS — I739 Peripheral vascular disease, unspecified: Secondary | ICD-10-CM

## 2021-03-13 DIAGNOSIS — M869 Osteomyelitis, unspecified: Secondary | ICD-10-CM | POA: Diagnosis not present

## 2021-03-14 ENCOUNTER — Ambulatory Visit: Payer: Medicare HMO | Admitting: Internal Medicine

## 2021-03-14 ENCOUNTER — Encounter: Payer: Self-pay | Admitting: Internal Medicine

## 2021-03-14 VITALS — BP 106/59 | HR 83 | Temp 97.8°F | Resp 16 | Ht 67.0 in | Wt 164.0 lb

## 2021-03-14 DIAGNOSIS — L089 Local infection of the skin and subcutaneous tissue, unspecified: Secondary | ICD-10-CM

## 2021-03-14 DIAGNOSIS — E11628 Type 2 diabetes mellitus with other skin complications: Secondary | ICD-10-CM

## 2021-03-14 NOTE — Patient Instructions (Signed)
Please continue to see your pcp/vascular service provider for evaluation of left leg swelling  You don't have further evidence of left toe infection   No need to follow up with ID service

## 2021-03-14 NOTE — Progress Notes (Signed)
[facility] for [facility]ious Disease  Reason for Consult:diabetic foot infection, left Referring Provider: [place]    Patient Active Problem List   Diagnosis Date Noted  . 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 Allergic rhinitis 11/14/2020  . Chronic constipation 11/14/2020  . Depression 11/14/2020  . Diabetic peripheral neuropathy associated with type 2 diabetes mellitus ([place]) 11/14/2020  . Dysphagia 11/14/2020  . Hyperlipidemia 11/14/2020  . Leukocytosis 11/14/2020  . Nausea and vomiting 11/14/2020  . Presence of aortocoronary bypass graft 11/14/2020  . Pure hypercholesterolemia 11/14/2020  . Vitamin D deficiency 11/14/2020  . Cryptogenic stroke ([place]) 07/20/2015  . History of stroke 07/06/2015  . Transient vision disturbance, right 07/06/2015  . Atherosclerosis of native arteries of extremity with intermittent claudication ([place]) 09/27/2014  . Localization-related symptomatic epilepsy and epileptic syndromes with complex partial seizures, not intractable, without status epilepticus ([place]) 08/04/2014  . Occipital stroke ([place]) 08/04/2014  . Cerebral thrombosis with cerebral infarction ([place]) 07/10/2014  . New onset seizure ([place]) 07/09/2014  . Diabetic hyperosmolar non-ketotic state ([place]) 07/09/2014  . Uncontrolled hypertension 07/09/2014  . Polycythemia vera ([place]) 07/09/2014  . Seizure ([place]) 07/09/2014  . Visual field defect 07/09/2014  . Headache 06/30/2014  . HTN (hypertension) 11/23/2013  . Dyslipidemia 11/23/2013  . Myelofibrosis ([place]) 11/22/2013  . Diverticular disease 11/22/2013  . Hemorrhoids 11/22/2013  . 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 CAD (coronary artery disease) 11/22/2013  . Peripheral vascular disease ([place]) 11/22/2013  . DM2 (diabetes mellitus, type 2) ([place]) 11/22/2013  . Renal cyst, left 11/22/2013  . Splenomegaly 11/22/2013      HPI: Tyler Whitehead is a 80 y.o. male nonsmoking pmh dm2, s/p right aka, pvd, hlp/htn,  cad s/p cabg, referred by podiatry here for comanagement left hallux OM  03/14/21 id f/u Doing well from id standpoint. Finished his 2 oral abx of 2 weeks on 6/1. I determined it was superficial infection and discussed with Dr Elisha Ponder of podiatry who agrees Patient toe nail grown back no purulence/pain there He does have chronic LLE edema that gotten worse; he is being followed by his pcp and vascular service. He'll see vascular service next week No chest pain/new sob.   I saw patient mid 02/2021 first time in rcid --------------- Reviewed chart  He has been seeing podiatry since 01/2021 for diabetic foot care. He saw them on 5/16 for 2 weeks left hallux pain and 2 days of discharge. He had had his nail trimmed/cared for by podiatry and he thinks one time the trimming triggered the process  He had PVD and had hx bypass in LLE by vascular surgery 1 year prior to this visit 02/2021. Had repeat abi 12/2020 and that showed severe disease. He had chronic LLE swelling/edema from the bypass  No f/c  Was given mupirocin ointment and doxycycline along with wound cx on 5/16 at the podiatry clinic. Also had bedside I&D done of the left hallux during 5/16 visit    I don't have any culture data  Of note, he also has been recently dx'ed with myelofibrosis    Review of Systems: ROS All other ros negative     Past Medical History:  Diagnosis Date  . CAD (coronary artery disease)    a. S/p 3V CABG 2000 with LIMA-LAD, left radial-PDA, SVG-OM1. b. 2014: stent to SVG-OM1; then found to have obstruction distal to LAD anastamosis of LIMA s/p DES.   . CHF (congestive heart failure) ([place])   .  Diverticular disease   . DM (diabetes mellitus) ([place])   . Hemorrhoid   . HTN (hypertension)   . Hyperlipidemia   . Peripheral vascular disease ([place])    a. R femoropopliteal bypass ~2012 in [place], reportedly shown to be occluded. Now followed by Dr. [name].  . 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[place])   . RBBB     Social  History   Tobacco Use  . Smoking status: Never Smoker  . Smokeless tobacco: Never Used  Substance Use Topics  . Alcohol use: Yes    Alcohol/week: 0.0 standard drinks    Comment: sometimes   . Drug use: No    Family History  Problem Relation Age of Onset  . Asthma Father   . Heart disease Father   . Hyperlipidemia Maternal Grandmother   . CAD Other        Multiple siblings  . Cancer Other        niece had lung cancer  . Cancer Paternal Uncle        brain cancer     No Known Allergies  OBJECTIVE: Vitals:   03/14/21 1520  BP: (!) 106/59  Pulse: 83  Resp: 16  Temp: 97.8 F (36.6 C)  SpO2: 98%  Weight: 164 lb (74.4 kg)  Height: 5\' 7"  (1.702 m)   Body mass index is 25.69 kg/m.   Physical Exam General/constitutional: no distress, pleasant HEENT: Normocephalic, PER, Conj Clear, EOMI, Oropharynx clear Neck supple CV: rrr no mrg Lungs: clear to auscultation, normal respiratory effort Abd: Soft, Nontender Ext: s/p right aki; LLE edema with distal hyperpigmentation nontender (prior vein surgery) Skin: No Rash Neuro: nonfocal MSK: no purulence/swelling/tenderness on hallux; the toe nail had grown back   Pictures below are from initial visit        Lab: Lab Results  Component Value Date   WBC 3.9 (L) 02/14/2021   HGB 9.8 (L) 02/14/2021   HCT 29.3 (L) 02/14/2021   MCV 114.9 (H) 02/14/2021   PLT 306 02/77/4128   Last metabolic panel Lab Results  Component Value Date   GLUCOSE 217 (H) 02/22/2021   NA 141 02/22/2021   K 5.0 02/22/2021   CL 109 02/22/2021   CO2 27 02/22/2021   BUN 22 02/22/2021   CREATININE 1.14 02/22/2021   GFRNONAA >60 09/15/2020   GFRAA >60 02/16/2015   CALCIUM 8.6 02/22/2021   PROT 5.6 (L) 02/22/2021   ALBUMIN 3.6 09/15/2020   BILITOT 0.3 02/22/2021   ALKPHOS 106 09/15/2020   AST 11 02/22/2021   ALT 11 02/22/2021   ANIONGAP 6 09/15/2020    Microbiology:  Serology:  Imaging: 12/19/20 left LE abi Left: Resting left  ankle-brachial index indicates severe left lower  extremity arterial disease. The left toe-brachial index is abnormal. LT  Great toe pressure = 0 mmHg.    5/16 xray left foot No report available I reviewed and no obvious osseous infection sign found   Assessment/plan: Problem List Items Addressed This Visit   None   Visit Diagnoses    Diabetic foot infection ([place])    -  Primary      I reviewed history/objective and exam finding. Appears he likely has superficial soft tissue infection.   Discussed natural process of diabetic foot infection and risk of recurrence  At this time, treating 14 days for soft tissue infection and monitoring for progression of disease  03/14/21 id assessment S/p superficial diabetic foot soft tissue infection tx Clinically today no sign of infection in the left toe  Has edema LLE that is beign w/u'ed by his pcp  -no need for further id f/u -f/u pcp/vascular service for lle swelling      Follow-up: No follow-ups on file.   Jabier Mutton, [facility] for [facility] [phone] pager   [phone] cell 03/14/2021, 3:45 PM

## 2021-03-15 ENCOUNTER — Other Ambulatory Visit: Payer: Self-pay | Admitting: *Deleted

## 2021-03-15 DIAGNOSIS — I739 Peripheral vascular disease, unspecified: Secondary | ICD-10-CM

## 2021-03-15 DIAGNOSIS — I70213 Atherosclerosis of native arteries of extremities with intermittent claudication, bilateral legs: Secondary | ICD-10-CM

## 2021-03-15 DIAGNOSIS — I70219 Atherosclerosis of native arteries of extremities with intermittent claudication, unspecified extremity: Secondary | ICD-10-CM

## 2021-03-18 NOTE — Progress Notes (Signed)
Subjective: 80 year old male presents the office today with a family member for evaluation of left hallux ulcer, concern for osteomyelitis.  He is doing better he states not noticed any drainage and feels the wound is healing.  His main concern is that his skin is getting darker.  Denies any fevers, chills, nausea, vomiting.  No other concerns today.  He has not yet followed up with vascular surgery.  Objective: AAO x3, NAD DP/PT pulses decreased  On the medial aspect of the nailbed of the hallux on the left side superficial area skin breakdown.  There is no probing, undermining or tunneling.  There is no drainage or pus identified today.  No erythema or warmth identified.  Darkened discoloration to the forefoot. No pain with calf compression, swelling, warmth, erythema         Assessment: Toe wound left foot; PAD  Plan: -All treatment options discussed with the patient including all alternatives, risks, complications.  -Overall the wound appears to be healing although slowly.  There is no drainage today.  I am concerned about the discoloration and given his history of an above-knee amputation the right side I called vascular surgery he has an appointment scheduled for next Monday. -Discussed daily foot inspection -Monitor for any clinical signs or symptoms of infection and directed to call the office immediately should any occur or go to the ER.    Trula Slade DPM

## 2021-03-20 ENCOUNTER — Ambulatory Visit (HOSPITAL_COMMUNITY)
Admission: RE | Admit: 2021-03-20 | Discharge: 2021-03-20 | Disposition: A | Discharge status: Home / Self Care | Payer: Medicare HMO | Source: Ambulatory Visit | Attending: Surgery | Admitting: Surgery

## 2021-03-20 ENCOUNTER — Encounter: Payer: Self-pay | Admitting: Surgery

## 2021-03-20 ENCOUNTER — Encounter (HOSPITAL_COMMUNITY): Payer: Self-pay

## 2021-03-20 ENCOUNTER — Ambulatory Visit: Payer: Medicare HMO | Admitting: Surgery

## 2021-03-20 ENCOUNTER — Other Ambulatory Visit: Payer: Self-pay

## 2021-03-20 VITALS — BP 144/72 | HR 74 | Temp 97.7°F | Resp 20 | Ht 67.0 in | Wt 164.0 lb

## 2021-03-20 DIAGNOSIS — I70219 Atherosclerosis of native arteries of extremities with intermittent claudication, unspecified extremity: Secondary | ICD-10-CM | POA: Insufficient documentation

## 2021-03-20 DIAGNOSIS — I70248 Atherosclerosis of native arteries of left leg with ulceration of other part of lower left leg: Secondary | ICD-10-CM

## 2021-03-20 DIAGNOSIS — I70213 Atherosclerosis of native arteries of extremities with intermittent claudication, bilateral legs: Secondary | ICD-10-CM

## 2021-03-20 DIAGNOSIS — I739 Peripheral vascular disease, unspecified: Secondary | ICD-10-CM | POA: Insufficient documentation

## 2021-03-20 NOTE — Progress Notes (Signed)
[facility]  Patient name: Tyler Whitehead MRN: [id number] DOB: 1941/07/18 Sex: male   REASON FOR VISIT:    Follow up  HISOTRY OF PRESENT ILLNESS:    SHAFIN POLLIO is a 80 y.o. male with a history of a right above-knee femoral-popliteal bypass graft with Gore-Tex, performed in [place].  This lasted approximately 2 to 3 years.  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  He has also undergone left leg bypass graft in [place].  This was occluded when we looked at it by ultrasound several months ago.  It is unknown when it occluded.  There was also the possibility of a femoral-femoral graft however this was not visualized with ultrasound.  His saphenous veins were harvested during CABG.  When I saw him in March 2022, he could tolerate his claudication symptoms and so no intervention was performed.  He now returns with a sore on the tip of his left great toe   The patient suffers from coronary artery disease, status post CABG.  He is a diabetic.  He is medically managed for hypertension.  He takes a statin for hypercholesterolemia.  He is a non-smoker.  PAST MEDICAL HISTORY:   Past Medical History:  Diagnosis Date   CAD (coronary artery disease)    a. S/p 3V CABG 2000 with LIMA-LAD, left radial-PDA, SVG-OM1. b. 2014: stent to SVG-OM1; then found to have obstruction distal to LAD anastamosis of LIMA s/p DES.    CHF (congestive heart failure) ([place])    Diverticular disease    DM (diabetes mellitus) ([place])    Hemorrhoid    HTN (hypertension)    Hyperlipidemia    Peripheral vascular disease ([place])    a. R femoropopliteal bypass ~2012 in [place], reportedly shown to be occluded. Now followed by Dr. [name].   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)    RBBB      FAMILY HISTORY:   Family History  Problem Relation Age of Onset   Asthma Father    Heart disease Father    Hyperlipidemia Maternal Grandmother     CAD Other        Multiple siblings   Cancer Other        niece had lung cancer   Cancer Paternal Uncle        brain cancer     SOCIAL HISTORY:   Social History   Tobacco Use   Smoking status: Never   Smokeless tobacco: Never  Substance Use Topics   Alcohol use: Yes    Alcohol/week: 0.0 standard drinks    Comment: sometimes      ALLERGIES:   No Known Allergies   CURRENT MEDICATIONS:   Current Outpatient Medications  Medication Sig Dispense Refill   aspirin 81 MG chewable tablet 1 tablet     atorvastatin (LIPITOR) 40 MG tablet 1 tablet     carvedilol (COREG) 3.125 MG tablet 1 tablet with food     cilostazol (PLETAL) 100 MG tablet      COVID-19 mRNA vaccine, Moderna, 100 MCG/0.5ML injection USE AS DIRECTED .25 mL 0   fluticasone (FLONASE) 50 MCG/ACT nasal spray Place 2 sprays into both nostrils as directed.     gabapentin (NEURONTIN) 300 MG capsule 1 capsule     hydroxyurea (HYDREA) 500 MG capsule Take 500 mg by mouth daily. Take 2 tablets in the am and 1 tablet in the pm on Sunday, Tuesday, Thursday, and Saturday.  No pm dose on Monday,  Wednesday, and Friday. . May take with food to minimize GI side effects.     insulin glargine (LANTUS) 100 UNIT/ML injection Inject 22 Units into the skin at bedtime.     insulin lispro (HUMALOG) 100 UNIT/ML injection Inject 4 Units into the skin 3 (three) times daily before meals.     isosorbide mononitrate (IMDUR) 60 MG 24 hr tablet Take 1-2 tablets (60-120 mg total) by mouth 2 (two) times daily. 2 in the morning and 1 in the evening 270 tablet 3   Lancets (ONETOUCH DELICA PLUS [id number]) MISC Apply topically 2 (two) times daily.     levocetirizine (XYZAL) 5 MG tablet 1 tablet in the evening     losartan (COZAAR) 25 MG tablet 1 tablet     mupirocin ointment (BACTROBAN) 2 % Apply to left great toe once daily. 30 g 1   neomycin-polymyxin-dexamethasone (MAXITROL) 0.1 % ophthalmic suspension      nitroGLYCERIN (NITROSTAT) 0.4 MG SL tablet  Place 1 tablet (0.4 mg total) under the tongue every 5 (five) minutes as needed for chest pain. 25 tablet 12   ONETOUCH VERIO test strip 1 each 2 (two) times daily.     PROAIR HFA 108 (90 BASE) MCG/ACT inhaler Inhale 1 puff into the lungs every 4 (four) hours as needed for wheezing or shortness of breath.      TRESIBA 100 UNIT/ML SOLN Inject into the skin.     triamcinolone ointment (KENALOG) 0.1 % Apply 1 application topically 3 (three) times daily. 30 g 1   chlorpheniramine-HYDROcodone (TUSSIONEX) 10-8 MG/5ML SUER 5 ml as needed (Patient not taking: Reported on 03/20/2021)     cholecalciferol (VITAMIN D) 1000 UNITS tablet Take 1,000 Units by mouth daily. (Patient not taking: Reported on 03/20/2021)     tiZANidine (ZANAFLEX) 2 MG tablet tizanidine 2 mg tablet (Patient not taking: Reported on 03/20/2021)     No current facility-administered medications for this visit.    REVIEW OF SYSTEMS:   [X]  denotes positive finding, [ ]  denotes negative finding Cardiac  Comments:  Chest pain or chest pressure:    Shortness of breath upon exertion:    Short of breath when lying flat:    Irregular heart rhythm:        Vascular    Pain in calf, thigh, or hip brought on by ambulation:    Pain in feet at night that wakes you up from your sleep:     Blood clot in your veins:    Leg swelling:         Pulmonary    Oxygen at home:    Productive cough:     Wheezing:         Neurologic    Sudden weakness in arms or legs:     Sudden numbness in arms or legs:     Sudden onset of difficulty speaking or slurred speech:    Temporary loss of vision in one eye:     Problems with dizziness:         Gastrointestinal    Blood in stool:     Vomited blood:         Genitourinary    Burning when urinating:     Blood in urine:        Psychiatric    Major depression:         Hematologic    Bleeding problems:    Problems with blood clotting too easily:        Skin  Rashes or ulcers: x        Constitutional    Fever or chills:      PHYSICAL EXAM:   Vitals:   03/20/21 1206  BP: (!) 144/72  Pulse: 74  Resp: 20  Temp: 97.7 F (36.5 C)  SpO2: 98%  Weight: 164 lb (74.4 kg)  Height: 5\' 7"  (1.702 m)    GENERAL: The patient is a well-nourished male, in no acute distress. The vital signs are documented above. CARDIAC: There is a regular rate and rhythm.  VASCULAR: Nonpalpable pulse on the left foot with pitting edema PULMONARY: Non-labored respirations ABDOMEN: Soft and non-tender with normal pitched bowel sounds.  MUSCULOSKELETAL: There are no major deformities or cyanosis. NEUROLOGIC: No focal weakness or paresthesias are detected. SKIN: See photo below. PSYCHIATRIC: The patient has a normal affect.   STUDIES:   I have reviewed the following:  +-------+-----------+-----------+------------+------------+  ABI/TBIToday's ABIToday's TBIPrevious ABIPrevious TBI  +-------+-----------+-----------+------------+------------+  Right  AKA                   AKA                       +-------+-----------+-----------+------------+------------+  Left   0.19       absent     0.30        0             +-------+-----------+-----------+------------+------------+   MEDICAL ISSUES:   PAD with ulceration: I discussed with the patient that this is a limb threatening situation.  The neck step is angiography to define his anatomy and see what options he has.  This is been scheduled for Tuesday, June 21 via a right femoral approach.  I will intervene if possible.  He likely will need to consider surgical revascularization.    Leia Alf, MD, FACS [facility] [phone] Pager [phone]

## 2021-03-20 NOTE — H&P (View-Only) (Signed)
[facility]ist of [facility]  Patient name: Tyler Whitehead MRN: [id number] DOB: 1941-04-14 Sex: male   REASON FOR VISIT:    Follow up  HISOTRY OF PRESENT ILLNESS:    Tyler Whitehead is a 80 y.o. male with a history of a right above-knee femoral-popliteal bypass graft with Gore-Tex, performed in [place].  This lasted approximately 2 to 3 years.  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  He has also undergone left leg bypass graft in [place].  This was occluded when we looked at it by ultrasound several months ago.  It is unknown when it occluded.  There was also the possibility of a femoral-femoral graft however this was not visualized with ultrasound.  His saphenous veins were harvested during CABG.  When I saw him in March 2022, he could tolerate his claudication symptoms and so no intervention was performed.  He now returns with a sore on the tip of his left great toe   The patient suffers from coronary artery disease, status post CABG.  He is a diabetic.  He is medically managed for hypertension.  He takes a statin for hypercholesterolemia.  He is a non-smoker.  PAST MEDICAL HISTORY:   Past Medical History:  Diagnosis Date   CAD (coronary artery disease)    a. S/p 3V CABG 2000 with LIMA-LAD, left radial-PDA, SVG-OM1. b. 2014: stent to SVG-OM1; then found to have obstruction distal to LAD anastamosis of LIMA s/p DES.    CHF (congestive heart failure) ([place])    Diverticular disease    DM (diabetes mellitus) ([place])    Hemorrhoid    HTN (hypertension)    Hyperlipidemia    Peripheral vascular disease ([place])    a. R femoropopliteal bypass ~2012 in [place], reportedly shown to be occluded. Now followed by Dr. [name].   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)    RBBB      FAMILY HISTORY:   Family History  Problem Relation Age of Onset   Asthma Father    Heart disease Father    Hyperlipidemia Maternal Grandmother     CAD Other        Multiple siblings   Cancer Other        niece had lung cancer   Cancer Paternal Uncle        brain cancer     SOCIAL HISTORY:   Social History   Tobacco Use   Smoking status: Never   Smokeless tobacco: Never  Substance Use Topics   Alcohol use: Yes    Alcohol/week: 0.0 standard drinks    Comment: sometimes      ALLERGIES:   No Known Allergies   CURRENT MEDICATIONS:   Current Outpatient Medications  Medication Sig Dispense Refill   aspirin 81 MG chewable tablet 1 tablet     atorvastatin (LIPITOR) 40 MG tablet 1 tablet     carvedilol (COREG) 3.125 MG tablet 1 tablet with food     cilostazol (PLETAL) 100 MG tablet      COVID-19 mRNA vaccine, Moderna, 100 MCG/0.5ML injection USE AS DIRECTED .25 mL 0   fluticasone (FLONASE) 50 MCG/ACT nasal spray Place 2 sprays into both nostrils as directed.     gabapentin (NEURONTIN) 300 MG capsule 1 capsule     hydroxyurea (HYDREA) 500 MG capsule Take 500 mg by mouth daily. Take 2 tablets in the am and 1 tablet in the pm on Sunday, Tuesday, Thursday, and Saturday.  No pm dose on Monday,  Wednesday, and Friday. . May take with food to minimize GI side effects.     insulin glargine (LANTUS) 100 UNIT/ML injection Inject 22 Units into the skin at bedtime.     insulin lispro (HUMALOG) 100 UNIT/ML injection Inject 4 Units into the skin 3 (three) times daily before meals.     isosorbide mononitrate (IMDUR) 60 MG 24 hr tablet Take 1-2 tablets (60-120 mg total) by mouth 2 (two) times daily. 2 in the morning and 1 in the evening 270 tablet 3   Lancets (ONETOUCH DELICA PLUS [id number]) MISC Apply topically 2 (two) times daily.     levocetirizine (XYZAL) 5 MG tablet 1 tablet in the evening     losartan (COZAAR) 25 MG tablet 1 tablet     mupirocin ointment (BACTROBAN) 2 % Apply to left great toe once daily. 30 g 1   neomycin-polymyxin-dexamethasone (MAXITROL) 0.1 % ophthalmic suspension      nitroGLYCERIN (NITROSTAT) 0.4 MG SL tablet  Place 1 tablet (0.4 mg total) under the tongue every 5 (five) minutes as needed for chest pain. 25 tablet 12   ONETOUCH VERIO test strip 1 each 2 (two) times daily.     PROAIR HFA 108 (90 BASE) MCG/ACT inhaler Inhale 1 puff into the lungs every 4 (four) hours as needed for wheezing or shortness of breath.      TRESIBA 100 UNIT/ML SOLN Inject into the skin.     triamcinolone ointment (KENALOG) 0.1 % Apply 1 application topically 3 (three) times daily. 30 g 1   chlorpheniramine-HYDROcodone (TUSSIONEX) 10-8 MG/5ML SUER 5 ml as needed (Patient not taking: Reported on 03/20/2021)     cholecalciferol (VITAMIN D) 1000 UNITS tablet Take 1,000 Units by mouth daily. (Patient not taking: Reported on 03/20/2021)     tiZANidine (ZANAFLEX) 2 MG tablet tizanidine 2 mg tablet (Patient not taking: Reported on 03/20/2021)     No current facility-administered medications for this visit.    REVIEW OF SYSTEMS:   [X]  denotes positive finding, [ ]  denotes negative finding Cardiac  Comments:  Chest pain or chest pressure:    Shortness of breath upon exertion:    Short of breath when lying flat:    Irregular heart rhythm:        Vascular    Pain in calf, thigh, or hip brought on by ambulation:    Pain in feet at night that wakes you up from your sleep:     Blood clot in your veins:    Leg swelling:         Pulmonary    Oxygen at home:    Productive cough:     Wheezing:         Neurologic    Sudden weakness in arms or legs:     Sudden numbness in arms or legs:     Sudden onset of difficulty speaking or slurred speech:    Temporary loss of vision in one eye:     Problems with dizziness:         Gastrointestinal    Blood in stool:     Vomited blood:         Genitourinary    Burning when urinating:     Blood in urine:        Psychiatric    Major depression:         Hematologic    Bleeding problems:    Problems with blood clotting too easily:        Skin  Rashes or ulcers: x        Constitutional    Fever or chills:      PHYSICAL EXAM:   Vitals:   03/20/21 1206  BP: (!) 144/72  Pulse: 74  Resp: 20  Temp: 97.7 F (36.5 C)  SpO2: 98%  Weight: 164 lb (74.4 kg)  Height: 5\' 7"  (1.702 m)    GENERAL: The patient is a well-nourished male, in no acute distress. The vital signs are documented above. CARDIAC: There is a regular rate and rhythm.  VASCULAR: Nonpalpable pulse on the left foot with pitting edema PULMONARY: Non-labored respirations ABDOMEN: Soft and non-tender with normal pitched bowel sounds.  MUSCULOSKELETAL: There are no major deformities or cyanosis. NEUROLOGIC: No focal weakness or paresthesias are detected. SKIN: See photo below. PSYCHIATRIC: The patient has a normal affect.   STUDIES:   I have reviewed the following:  +-------+-----------+-----------+------------+------------+  ABI/TBIToday's ABIToday's TBIPrevious ABIPrevious TBI  +-------+-----------+-----------+------------+------------+  Right  AKA                   AKA                       +-------+-----------+-----------+------------+------------+  Left   0.19       absent     0.30        0             +-------+-----------+-----------+------------+------------+   MEDICAL ISSUES:   PAD with ulceration: I discussed with the patient that this is a limb threatening situation.  The neck step is angiography to define his anatomy and see what options he has.  This is been scheduled for Tuesday, June 21 via a right femoral approach.  I will intervene if possible.  He likely will need to consider surgical revascularization.    Leia Alf, MD, FACS [facility] [phone] Pager [phone]

## 2021-03-23 ENCOUNTER — Telehealth: Payer: Self-pay

## 2021-03-23 NOTE — Telephone Encounter (Signed)
Pt updated in arrival time of 0700 AM for procedure on 6/21/2 at [facility]. Voiced understanding.

## 2021-03-28 ENCOUNTER — Other Ambulatory Visit: Payer: Self-pay

## 2021-03-28 ENCOUNTER — Ambulatory Visit (HOSPITAL_COMMUNITY)
Admission: RE | Admit: 2021-03-28 | Discharge: 2021-03-28 | Disposition: A | Discharge status: Home / Self Care | Payer: Medicare HMO | Source: Ambulatory Visit | Attending: Surgery | Admitting: Surgery

## 2021-03-28 ENCOUNTER — Encounter (HOSPITAL_COMMUNITY)
Admission: RE | Disposition: A | Discharge status: Home / Self Care | Payer: Self-pay | Source: Ambulatory Visit | Attending: Surgery

## 2021-03-28 DIAGNOSIS — Z7982 Long term (current) use of aspirin: Secondary | ICD-10-CM | POA: Insufficient documentation

## 2021-03-28 DIAGNOSIS — Z79899 Other long term (current) drug therapy: Secondary | ICD-10-CM | POA: Diagnosis not present

## 2021-03-28 DIAGNOSIS — E7800 Pure hypercholesterolemia, unspecified: Secondary | ICD-10-CM | POA: Diagnosis not present

## 2021-03-28 DIAGNOSIS — L97529 Non-pressure chronic ulcer of other part of left foot with unspecified severity: Secondary | ICD-10-CM | POA: Diagnosis not present

## 2021-03-28 DIAGNOSIS — Z951 Presence of aortocoronary bypass graft: Secondary | ICD-10-CM | POA: Diagnosis not present

## 2021-03-28 DIAGNOSIS — I701 Atherosclerosis of renal artery: Secondary | ICD-10-CM | POA: Insufficient documentation

## 2021-03-28 DIAGNOSIS — E11621 Type 2 diabetes mellitus with foot ulcer: Secondary | ICD-10-CM | POA: Insufficient documentation

## 2021-03-28 DIAGNOSIS — Z794 Long term (current) use of insulin: Secondary | ICD-10-CM | POA: Insufficient documentation

## 2021-03-28 DIAGNOSIS — I2510 Atherosclerotic heart disease of native coronary artery without angina pectoris: Secondary | ICD-10-CM | POA: Diagnosis not present

## 2021-03-28 DIAGNOSIS — E1151 Type 2 diabetes mellitus with diabetic peripheral angiopathy without gangrene: Secondary | ICD-10-CM | POA: Diagnosis not present

## 2021-03-28 DIAGNOSIS — I70245 Atherosclerosis of native arteries of left leg with ulceration of other part of foot: Secondary | ICD-10-CM | POA: Insufficient documentation

## 2021-03-28 DIAGNOSIS — I10 Essential (primary) hypertension: Secondary | ICD-10-CM | POA: Insufficient documentation

## 2021-03-28 HISTORY — PX: ABDOMINAL AORTOGRAM W/LOWER EXTREMITY: CATH118223

## 2021-03-28 LAB — POCT I-STAT, CHEM 8
BUN: 15 mg/dL (ref 8–23)
Calcium, Ion: 1.02 mmol/L — ABNORMAL LOW (ref 1.15–1.40)
Chloride: 104 mmol/L (ref 98–111)
Creatinine, Ser: 1.7 mg/dL — ABNORMAL HIGH (ref 0.61–1.24)
Glucose, Bld: 103 mg/dL — ABNORMAL HIGH (ref 70–99)
HCT: 31 % — ABNORMAL LOW (ref 39.0–52.0)
Hemoglobin: 10.5 g/dL — ABNORMAL LOW (ref 13.0–17.0)
Potassium: 4.4 mmol/L (ref 3.5–5.1)
Sodium: 140 mmol/L (ref 135–145)
TCO2: 24 mmol/L (ref 22–32)

## 2021-03-28 SURGERY — ABDOMINAL AORTOGRAM W/LOWER EXTREMITY
Anesthesia: LOCAL

## 2021-03-28 MED ORDER — MIDAZOLAM HCL 5 MG/5ML IJ SOLN
INTRAMUSCULAR | Status: AC
  Filled 2021-03-28: qty 5

## 2021-03-28 MED ORDER — IODIXANOL 320 MG/ML IV SOLN
INTRAVENOUS | Status: DC | PRN
  Administered 2021-03-28: 68 mL via INTRA_ARTERIAL

## 2021-03-28 MED ORDER — ACETAMINOPHEN 325 MG PO TABS: 650.0000 mg | ORAL_TABLET | ORAL | Status: DC | PRN

## 2021-03-28 MED ORDER — HYDRALAZINE HCL 20 MG/ML IJ SOLN: 5.0000 mg | INTRAMUSCULAR | Status: DC | PRN

## 2021-03-28 MED ORDER — LABETALOL HCL 5 MG/ML IV SOLN: 10.0000 mg | INTRAVENOUS | Status: DC | PRN

## 2021-03-28 MED ORDER — SODIUM CHLORIDE 0.9% FLUSH: 3.0000 mL | INTRAVENOUS | Status: DC | PRN

## 2021-03-28 MED ORDER — SODIUM CHLORIDE 0.9 % IV SOLN
250.0000 mL | INTRAVENOUS | Status: DC | PRN
Start: 2021-03-28 — End: 2021-03-28

## 2021-03-28 MED ORDER — FENTANYL CITRATE (PF) 100 MCG/2ML IJ SOLN
INTRAMUSCULAR | Status: DC | PRN
  Administered 2021-03-28: 50 ug via INTRAVENOUS

## 2021-03-28 MED ORDER — HEPARIN (PORCINE) IN NACL 1000-0.9 UT/500ML-% IV SOLN
INTRAVENOUS | Status: DC | PRN
  Administered 2021-03-28 (×2): 500 mL

## 2021-03-28 MED ORDER — SODIUM CHLORIDE 0.9 % WEIGHT BASED INFUSION: 1.0000 mL/kg/h | INTRAVENOUS | Status: DC

## 2021-03-28 MED ORDER — OXYCODONE HCL 5 MG PO TABS: 5.0000 mg | ORAL_TABLET | ORAL | Status: DC | PRN

## 2021-03-28 MED ORDER — HEPARIN (PORCINE) IN NACL 1000-0.9 UT/500ML-% IV SOLN
INTRAVENOUS | Status: AC
  Filled 2021-03-28: qty 1000

## 2021-03-28 MED ORDER — LIDOCAINE HCL (PF) 1 % IJ SOLN
INTRAMUSCULAR | Status: AC
  Filled 2021-03-28: qty 30

## 2021-03-28 MED ORDER — SODIUM CHLORIDE 0.9% FLUSH: 3.0000 mL | Freq: Two times a day (BID) | INTRAVENOUS | Status: DC

## 2021-03-28 MED ORDER — MIDAZOLAM HCL 2 MG/2ML IJ SOLN
INTRAMUSCULAR | Status: DC | PRN
  Administered 2021-03-28: 1 mg via INTRAVENOUS

## 2021-03-28 MED ORDER — SODIUM CHLORIDE 0.9 % IV SOLN: INTRAVENOUS | Status: DC

## 2021-03-28 MED ORDER — LIDOCAINE HCL (PF) 1 % IJ SOLN
INTRAMUSCULAR | Status: DC | PRN
  Administered 2021-03-28: 18 mL via INTRADERMAL

## 2021-03-28 MED ORDER — FENTANYL CITRATE (PF) 100 MCG/2ML IJ SOLN
INTRAMUSCULAR | Status: AC
  Filled 2021-03-28: qty 2

## 2021-03-28 MED ORDER — ONDANSETRON HCL 4 MG/2ML IJ SOLN: 4.0000 mg | Freq: Four times a day (QID) | INTRAMUSCULAR | Status: DC | PRN

## 2021-03-28 SURGICAL SUPPLY — 10 items
CATH OMNI FLUSH 5F 65CM (CATHETERS) ×2 IMPLANT
DEVICE VASC CLSR CELT ART 5 (Vascular Products) ×2 IMPLANT
KIT MICROPUNCTURE NIT STIFF (SHEATH) ×2 IMPLANT
KIT PV (KITS) ×2 IMPLANT
SHEATH PINNACLE 5F 10CM (SHEATH) ×2 IMPLANT
SHEATH PROBE COVER 6X72 (BAG) ×2 IMPLANT
SYR MEDRAD MARK 7 150ML (SYRINGE) ×2 IMPLANT
TRANSDUCER W/STOPCOCK (MISCELLANEOUS) ×2 IMPLANT
TRAY PV CATH (CUSTOM PROCEDURE TRAY) ×2 IMPLANT
WIRE BENTSON .035X145CM (WIRE) ×2 IMPLANT

## 2021-03-28 NOTE — Progress Notes (Signed)
Pt request Social service visit for help at home, ER was contacted, she// Staff SS is not on staff today but will contact house SS and inform of need. Pt informed visit was requested but unsure if it will be done today, will be contacted at home number if unable today.

## 2021-03-28 NOTE — Discharge Planning (Signed)
Tyler Whitehead J. Clydene Laming, RN, BSN, [place] [phone] RNCM spoke with pt at bedside regarding discharge planning for [name]. Offered pt medicare.gov list of home health agencies to choose from.  Pt chose [name] to render services. [name] of [facility] notified. Patient made aware that [facility] will be in contact in 24-48 hours.  No DME needs identified at this time.

## 2021-03-28 NOTE — Interval H&P Note (Signed)
History and Physical Interval Note:  03/28/2021 9:29 AM  Tyler Whitehead  has presented today for surgery, with the diagnosis of pvd w/ ulcer.  The various methods of treatment have been discussed with the patient and family. After consideration of risks, benefits and other options for treatment, the patient has consented to  Procedure(s): ABDOMINAL AORTOGRAM W/LOWER EXTREMITY (N/A) as a surgical intervention.  The patient's history has been reviewed, patient examined, no change in status, stable for surgery.  I have reviewed the patient's chart and labs.  Questions were answered to the patient's satisfaction.     [name]

## 2021-03-28 NOTE — Discharge Planning (Signed)
RNCM met with pt at bedside regarding home health services.  Pt interested in RN/PT and SW services to help him become stronger and help him sign up for Medicaid.  RNCM requested face-to-face to be placed by provider.  RNCM following for disposition needs.  J. , RN, BSN, NCM [phone]   

## 2021-03-28 NOTE — Op Note (Signed)
    Patient name: Tyler Whitehead MRN: [id number] DOB: 14-May-1941 Sex: male  03/28/2021 Pre-operative Diagnosis: Left foot ulcer Post-operative diagnosis:  Same Surgeon:  [name] Procedure Performed:  1.  Ultrasound-guided access, right femoral artery  2.  Abdominal aortogram  3.  Second-order catheterization  4.  Left lower extremity runoff  5.  Conscious sedation, 36 minutes  6.  Closure device, Celt     Indications: This is a 80 year old gentleman having undergone multiple failed bypass grafts in [place] who comes in with ulcer.  Procedure:  The patient was identified in the holding area and taken to room 8.  The patient was then placed supine on the table and prepped and draped in the usual sterile fashion.  A time out was called.  Conscious sedation was administered with the use of IV fentanyl and Versed under continuous physician and nurse monitoring.  Heart rate, blood pressure, and oxygen saturation were continuously monitored.  Total sedation time was 36 minutes.  Ultrasound was used to evaluate the right common femoral artery.  It was patent .  A digital ultrasound image was acquired.  A micropuncture needle was used to access the right common femoral artery under ultrasound guidance.  An 018 wire was advanced without resistance and a micropuncture sheath was placed.  The 018 wire was removed and a benson wire was placed.  The micropuncture sheath was exchanged for a 5 french sheath.  An omniflush catheter was advanced over the wire to the level of L-1.  An abdominal angiogram was obtained.  Next, using the omniflush catheter and a benson wire, the aortic bifurcation was crossed and the catheter was placed into theleft external iliac artery and left runoff was obtained.   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  The infrarenal abdominal aorta is widely patent without stenosis.  Bilateral common and external iliac arteries are widely patent without  stenosis.  Right Lower Extremity: Not evaluated  Left Lower Extremity: Left common femoral and profundofemoral artery are patent.  There is abrupt termination of some of the main profunda branches.  The superficial femoral artery is occluded.  Via geniculate collaterals there is reconstitution of a below-knee popliteal artery which is very small in caliber.  There does appear to be runoff via the posterior tibial which crosses the ankle and also peroneal artery proximally.  Intervention: None.  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  There is reconstitution of the below-knee popliteal artery which is small in caliber.  The dominant runoff is the posterior tibial  #2  For revascularization, the patient's best option is a redo left femoral to below-knee popliteal artery bypass graft.  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  The patient's wound appears to have healed.  Therefore revascularization is not necessary at this time.  He is complaining leg swelling and pain with that and so I will try to get him into compression socks prior to discharge     V. [name], M.D., [facility] [place] Office: [phone] Pager:  [phone]

## 2021-03-29 ENCOUNTER — Encounter (HOSPITAL_COMMUNITY): Payer: Self-pay | Admitting: Surgery

## 2021-03-29 LAB — POCT I-STAT, CHEM 8
BUN: 20 mg/dL (ref 8–23)
Calcium, Ion: 1.19 mmol/L (ref 1.15–1.40)
Chloride: 103 mmol/L (ref 98–111)
Creatinine, Ser: 1.9 mg/dL — ABNORMAL HIGH (ref 0.61–1.24)
Glucose, Bld: 106 mg/dL — ABNORMAL HIGH (ref 70–99)
HCT: 34 % — ABNORMAL LOW (ref 39.0–52.0)
Hemoglobin: 11.6 g/dL — ABNORMAL LOW (ref 13.0–17.0)
Potassium: 7.3 mmol/L (ref 3.5–5.1)
Sodium: 138 mmol/L (ref 135–145)
TCO2: 31 mmol/L (ref 22–32)

## 2021-03-31 DIAGNOSIS — Z794 Long term (current) use of insulin: Secondary | ICD-10-CM | POA: Diagnosis not present

## 2021-03-31 DIAGNOSIS — Z8673 Personal history of transient ischemic attack (TIA), and cerebral infarction without residual deficits: Secondary | ICD-10-CM | POA: Diagnosis not present

## 2021-03-31 DIAGNOSIS — K5790 Diverticulosis of intestine, part unspecified, without perforation or abscess without bleeding: Secondary | ICD-10-CM | POA: Diagnosis not present

## 2021-03-31 DIAGNOSIS — E1142 Type 2 diabetes mellitus with diabetic polyneuropathy: Secondary | ICD-10-CM | POA: Diagnosis not present

## 2021-03-31 DIAGNOSIS — Z48812 Encounter for surgical aftercare following surgery on the circulatory system: Secondary | ICD-10-CM | POA: Diagnosis not present

## 2021-03-31 DIAGNOSIS — I2510 Atherosclerotic heart disease of native coronary artery without angina pectoris: Secondary | ICD-10-CM | POA: Diagnosis not present

## 2021-03-31 DIAGNOSIS — D471 Chronic myeloproliferative disease: Secondary | ICD-10-CM | POA: Diagnosis not present

## 2021-03-31 DIAGNOSIS — Z89511 Acquired absence of right leg below knee: Secondary | ICD-10-CM | POA: Diagnosis not present

## 2021-03-31 DIAGNOSIS — E11621 Type 2 diabetes mellitus with foot ulcer: Secondary | ICD-10-CM | POA: Diagnosis not present

## 2021-03-31 DIAGNOSIS — E7800 Pure hypercholesterolemia, unspecified: Secondary | ICD-10-CM | POA: Diagnosis not present

## 2021-03-31 DIAGNOSIS — E1151 Type 2 diabetes mellitus with diabetic peripheral angiopathy without gangrene: Secondary | ICD-10-CM | POA: Diagnosis not present

## 2021-03-31 DIAGNOSIS — I110 Hypertensive heart disease with heart failure: Secondary | ICD-10-CM | POA: Diagnosis not present

## 2021-03-31 DIAGNOSIS — I509 Heart failure, unspecified: Secondary | ICD-10-CM | POA: Diagnosis not present

## 2021-03-31 DIAGNOSIS — L97528 Non-pressure chronic ulcer of other part of left foot with other specified severity: Secondary | ICD-10-CM | POA: Diagnosis not present

## 2021-03-31 DIAGNOSIS — I4510 Unspecified right bundle-branch block: Secondary | ICD-10-CM | POA: Diagnosis not present

## 2021-03-31 DIAGNOSIS — Z951 Presence of aortocoronary bypass graft: Secondary | ICD-10-CM | POA: Diagnosis not present

## 2021-03-31 DIAGNOSIS — Z95828 Presence of other vascular implants and grafts: Secondary | ICD-10-CM | POA: Diagnosis not present

## 2021-03-31 DIAGNOSIS — I10 Essential (primary) hypertension: Secondary | ICD-10-CM | POA: Diagnosis not present

## 2021-03-31 DIAGNOSIS — Z7982 Long term (current) use of aspirin: Secondary | ICD-10-CM | POA: Diagnosis not present

## 2021-04-05 DIAGNOSIS — E1151 Type 2 diabetes mellitus with diabetic peripheral angiopathy without gangrene: Secondary | ICD-10-CM | POA: Diagnosis not present

## 2021-04-05 DIAGNOSIS — Z8673 Personal history of transient ischemic attack (TIA), and cerebral infarction without residual deficits: Secondary | ICD-10-CM | POA: Diagnosis not present

## 2021-04-05 DIAGNOSIS — Z95828 Presence of other vascular implants and grafts: Secondary | ICD-10-CM | POA: Diagnosis not present

## 2021-04-05 DIAGNOSIS — I4510 Unspecified right bundle-branch block: Secondary | ICD-10-CM | POA: Diagnosis not present

## 2021-04-05 DIAGNOSIS — K5790 Diverticulosis of intestine, part unspecified, without perforation or abscess without bleeding: Secondary | ICD-10-CM | POA: Diagnosis not present

## 2021-04-05 DIAGNOSIS — E7800 Pure hypercholesterolemia, unspecified: Secondary | ICD-10-CM | POA: Diagnosis not present

## 2021-04-05 DIAGNOSIS — Z794 Long term (current) use of insulin: Secondary | ICD-10-CM | POA: Diagnosis not present

## 2021-04-05 DIAGNOSIS — D471 Chronic myeloproliferative disease: Secondary | ICD-10-CM | POA: Diagnosis not present

## 2021-04-05 DIAGNOSIS — I509 Heart failure, unspecified: Secondary | ICD-10-CM | POA: Diagnosis not present

## 2021-04-05 DIAGNOSIS — L97528 Non-pressure chronic ulcer of other part of left foot with other specified severity: Secondary | ICD-10-CM | POA: Diagnosis not present

## 2021-04-05 DIAGNOSIS — I110 Hypertensive heart disease with heart failure: Secondary | ICD-10-CM | POA: Diagnosis not present

## 2021-04-05 DIAGNOSIS — E11621 Type 2 diabetes mellitus with foot ulcer: Secondary | ICD-10-CM | POA: Diagnosis not present

## 2021-04-05 DIAGNOSIS — Z48812 Encounter for surgical aftercare following surgery on the circulatory system: Secondary | ICD-10-CM | POA: Diagnosis not present

## 2021-04-05 DIAGNOSIS — Z951 Presence of aortocoronary bypass graft: Secondary | ICD-10-CM | POA: Diagnosis not present

## 2021-04-05 DIAGNOSIS — Z89511 Acquired absence of right leg below knee: Secondary | ICD-10-CM | POA: Diagnosis not present

## 2021-04-05 DIAGNOSIS — I2510 Atherosclerotic heart disease of native coronary artery without angina pectoris: Secondary | ICD-10-CM | POA: Diagnosis not present

## 2021-04-05 DIAGNOSIS — I10 Essential (primary) hypertension: Secondary | ICD-10-CM | POA: Diagnosis not present

## 2021-04-05 DIAGNOSIS — Z7982 Long term (current) use of aspirin: Secondary | ICD-10-CM | POA: Diagnosis not present

## 2021-04-06 ENCOUNTER — Other Ambulatory Visit: Payer: Self-pay

## 2021-04-06 ENCOUNTER — Telehealth: Payer: Self-pay

## 2021-04-06 DIAGNOSIS — K5790 Diverticulosis of intestine, part unspecified, without perforation or abscess without bleeding: Secondary | ICD-10-CM | POA: Diagnosis not present

## 2021-04-06 DIAGNOSIS — I10 Essential (primary) hypertension: Secondary | ICD-10-CM | POA: Diagnosis not present

## 2021-04-06 DIAGNOSIS — E7800 Pure hypercholesterolemia, unspecified: Secondary | ICD-10-CM | POA: Diagnosis not present

## 2021-04-06 DIAGNOSIS — L97528 Non-pressure chronic ulcer of other part of left foot with other specified severity: Secondary | ICD-10-CM | POA: Diagnosis not present

## 2021-04-06 DIAGNOSIS — Z794 Long term (current) use of insulin: Secondary | ICD-10-CM | POA: Diagnosis not present

## 2021-04-06 DIAGNOSIS — I110 Hypertensive heart disease with heart failure: Secondary | ICD-10-CM | POA: Diagnosis not present

## 2021-04-06 DIAGNOSIS — I509 Heart failure, unspecified: Secondary | ICD-10-CM | POA: Diagnosis not present

## 2021-04-06 DIAGNOSIS — Z7982 Long term (current) use of aspirin: Secondary | ICD-10-CM | POA: Diagnosis not present

## 2021-04-06 DIAGNOSIS — E11621 Type 2 diabetes mellitus with foot ulcer: Secondary | ICD-10-CM | POA: Diagnosis not present

## 2021-04-06 DIAGNOSIS — E1151 Type 2 diabetes mellitus with diabetic peripheral angiopathy without gangrene: Secondary | ICD-10-CM | POA: Diagnosis not present

## 2021-04-06 DIAGNOSIS — Z89511 Acquired absence of right leg below knee: Secondary | ICD-10-CM | POA: Diagnosis not present

## 2021-04-06 DIAGNOSIS — Z951 Presence of aortocoronary bypass graft: Secondary | ICD-10-CM | POA: Diagnosis not present

## 2021-04-06 DIAGNOSIS — I2510 Atherosclerotic heart disease of native coronary artery without angina pectoris: Secondary | ICD-10-CM | POA: Diagnosis not present

## 2021-04-06 DIAGNOSIS — Z48812 Encounter for surgical aftercare following surgery on the circulatory system: Secondary | ICD-10-CM | POA: Diagnosis not present

## 2021-04-06 DIAGNOSIS — I4510 Unspecified right bundle-branch block: Secondary | ICD-10-CM | POA: Diagnosis not present

## 2021-04-06 DIAGNOSIS — Z8673 Personal history of transient ischemic attack (TIA), and cerebral infarction without residual deficits: Secondary | ICD-10-CM | POA: Diagnosis not present

## 2021-04-06 DIAGNOSIS — D471 Chronic myeloproliferative disease: Secondary | ICD-10-CM | POA: Diagnosis not present

## 2021-04-06 DIAGNOSIS — Z95828 Presence of other vascular implants and grafts: Secondary | ICD-10-CM | POA: Diagnosis not present

## 2021-04-06 NOTE — Telephone Encounter (Signed)
Physical Therapist Rosalie Doctor called requesting PT orders for patient.  He says patient complained of chest pain (4 out of 10) but says he always has chest pain and takes nitroglycerin.  Orders were given with the understanding that the patient was given directions to go to the ED if symptoms worsened as well as what symptoms to look for.

## 2021-04-11 DIAGNOSIS — I4510 Unspecified right bundle-branch block: Secondary | ICD-10-CM | POA: Diagnosis not present

## 2021-04-11 DIAGNOSIS — E7800 Pure hypercholesterolemia, unspecified: Secondary | ICD-10-CM | POA: Diagnosis not present

## 2021-04-11 DIAGNOSIS — Z8673 Personal history of transient ischemic attack (TIA), and cerebral infarction without residual deficits: Secondary | ICD-10-CM | POA: Diagnosis not present

## 2021-04-11 DIAGNOSIS — Z89511 Acquired absence of right leg below knee: Secondary | ICD-10-CM | POA: Diagnosis not present

## 2021-04-11 DIAGNOSIS — I2510 Atherosclerotic heart disease of native coronary artery without angina pectoris: Secondary | ICD-10-CM | POA: Diagnosis not present

## 2021-04-11 DIAGNOSIS — Z951 Presence of aortocoronary bypass graft: Secondary | ICD-10-CM | POA: Diagnosis not present

## 2021-04-11 DIAGNOSIS — K5790 Diverticulosis of intestine, part unspecified, without perforation or abscess without bleeding: Secondary | ICD-10-CM | POA: Diagnosis not present

## 2021-04-11 DIAGNOSIS — I110 Hypertensive heart disease with heart failure: Secondary | ICD-10-CM | POA: Diagnosis not present

## 2021-04-11 DIAGNOSIS — E1151 Type 2 diabetes mellitus with diabetic peripheral angiopathy without gangrene: Secondary | ICD-10-CM | POA: Diagnosis not present

## 2021-04-11 DIAGNOSIS — I10 Essential (primary) hypertension: Secondary | ICD-10-CM | POA: Diagnosis not present

## 2021-04-11 DIAGNOSIS — E11621 Type 2 diabetes mellitus with foot ulcer: Secondary | ICD-10-CM | POA: Diagnosis not present

## 2021-04-11 DIAGNOSIS — I509 Heart failure, unspecified: Secondary | ICD-10-CM | POA: Diagnosis not present

## 2021-04-11 DIAGNOSIS — Z7982 Long term (current) use of aspirin: Secondary | ICD-10-CM | POA: Diagnosis not present

## 2021-04-11 DIAGNOSIS — Z794 Long term (current) use of insulin: Secondary | ICD-10-CM | POA: Diagnosis not present

## 2021-04-11 DIAGNOSIS — Z95828 Presence of other vascular implants and grafts: Secondary | ICD-10-CM | POA: Diagnosis not present

## 2021-04-11 DIAGNOSIS — L97528 Non-pressure chronic ulcer of other part of left foot with other specified severity: Secondary | ICD-10-CM | POA: Diagnosis not present

## 2021-04-11 DIAGNOSIS — D471 Chronic myeloproliferative disease: Secondary | ICD-10-CM | POA: Diagnosis not present

## 2021-04-11 DIAGNOSIS — Z48812 Encounter for surgical aftercare following surgery on the circulatory system: Secondary | ICD-10-CM | POA: Diagnosis not present

## 2021-04-13 DIAGNOSIS — I10 Essential (primary) hypertension: Secondary | ICD-10-CM | POA: Diagnosis not present

## 2021-04-13 DIAGNOSIS — E7800 Pure hypercholesterolemia, unspecified: Secondary | ICD-10-CM | POA: Diagnosis not present

## 2021-04-13 DIAGNOSIS — Z794 Long term (current) use of insulin: Secondary | ICD-10-CM | POA: Diagnosis not present

## 2021-04-13 DIAGNOSIS — Z7982 Long term (current) use of aspirin: Secondary | ICD-10-CM | POA: Diagnosis not present

## 2021-04-13 DIAGNOSIS — Z951 Presence of aortocoronary bypass graft: Secondary | ICD-10-CM | POA: Diagnosis not present

## 2021-04-13 DIAGNOSIS — Z8673 Personal history of transient ischemic attack (TIA), and cerebral infarction without residual deficits: Secondary | ICD-10-CM | POA: Diagnosis not present

## 2021-04-13 DIAGNOSIS — I110 Hypertensive heart disease with heart failure: Secondary | ICD-10-CM | POA: Diagnosis not present

## 2021-04-13 DIAGNOSIS — Z48812 Encounter for surgical aftercare following surgery on the circulatory system: Secondary | ICD-10-CM | POA: Diagnosis not present

## 2021-04-13 DIAGNOSIS — I2510 Atherosclerotic heart disease of native coronary artery without angina pectoris: Secondary | ICD-10-CM | POA: Diagnosis not present

## 2021-04-13 DIAGNOSIS — K5790 Diverticulosis of intestine, part unspecified, without perforation or abscess without bleeding: Secondary | ICD-10-CM | POA: Diagnosis not present

## 2021-04-13 DIAGNOSIS — I509 Heart failure, unspecified: Secondary | ICD-10-CM | POA: Diagnosis not present

## 2021-04-13 DIAGNOSIS — L97528 Non-pressure chronic ulcer of other part of left foot with other specified severity: Secondary | ICD-10-CM | POA: Diagnosis not present

## 2021-04-13 DIAGNOSIS — E1151 Type 2 diabetes mellitus with diabetic peripheral angiopathy without gangrene: Secondary | ICD-10-CM | POA: Diagnosis not present

## 2021-04-13 DIAGNOSIS — Z95828 Presence of other vascular implants and grafts: Secondary | ICD-10-CM | POA: Diagnosis not present

## 2021-04-13 DIAGNOSIS — I4510 Unspecified right bundle-branch block: Secondary | ICD-10-CM | POA: Diagnosis not present

## 2021-04-13 DIAGNOSIS — E11621 Type 2 diabetes mellitus with foot ulcer: Secondary | ICD-10-CM | POA: Diagnosis not present

## 2021-04-13 DIAGNOSIS — Z89511 Acquired absence of right leg below knee: Secondary | ICD-10-CM | POA: Diagnosis not present

## 2021-04-13 DIAGNOSIS — D471 Chronic myeloproliferative disease: Secondary | ICD-10-CM | POA: Diagnosis not present

## 2021-04-14 ENCOUNTER — Telehealth: Payer: Self-pay | Admitting: Nurse Practitioner

## 2021-04-14 DIAGNOSIS — I110 Hypertensive heart disease with heart failure: Secondary | ICD-10-CM | POA: Diagnosis not present

## 2021-04-14 DIAGNOSIS — I4510 Unspecified right bundle-branch block: Secondary | ICD-10-CM | POA: Diagnosis not present

## 2021-04-14 DIAGNOSIS — E11621 Type 2 diabetes mellitus with foot ulcer: Secondary | ICD-10-CM | POA: Diagnosis not present

## 2021-04-14 DIAGNOSIS — Z794 Long term (current) use of insulin: Secondary | ICD-10-CM | POA: Diagnosis not present

## 2021-04-14 DIAGNOSIS — D471 Chronic myeloproliferative disease: Secondary | ICD-10-CM | POA: Diagnosis not present

## 2021-04-14 DIAGNOSIS — Z89511 Acquired absence of right leg below knee: Secondary | ICD-10-CM | POA: Diagnosis not present

## 2021-04-14 DIAGNOSIS — E1151 Type 2 diabetes mellitus with diabetic peripheral angiopathy without gangrene: Secondary | ICD-10-CM | POA: Diagnosis not present

## 2021-04-14 DIAGNOSIS — I509 Heart failure, unspecified: Secondary | ICD-10-CM | POA: Diagnosis not present

## 2021-04-14 DIAGNOSIS — I10 Essential (primary) hypertension: Secondary | ICD-10-CM | POA: Diagnosis not present

## 2021-04-14 DIAGNOSIS — E7800 Pure hypercholesterolemia, unspecified: Secondary | ICD-10-CM | POA: Diagnosis not present

## 2021-04-14 DIAGNOSIS — Z951 Presence of aortocoronary bypass graft: Secondary | ICD-10-CM | POA: Diagnosis not present

## 2021-04-14 DIAGNOSIS — Z7982 Long term (current) use of aspirin: Secondary | ICD-10-CM | POA: Diagnosis not present

## 2021-04-14 DIAGNOSIS — Z95828 Presence of other vascular implants and grafts: Secondary | ICD-10-CM | POA: Diagnosis not present

## 2021-04-14 DIAGNOSIS — Z48812 Encounter for surgical aftercare following surgery on the circulatory system: Secondary | ICD-10-CM | POA: Diagnosis not present

## 2021-04-14 DIAGNOSIS — L97528 Non-pressure chronic ulcer of other part of left foot with other specified severity: Secondary | ICD-10-CM | POA: Diagnosis not present

## 2021-04-14 DIAGNOSIS — Z8673 Personal history of transient ischemic attack (TIA), and cerebral infarction without residual deficits: Secondary | ICD-10-CM | POA: Diagnosis not present

## 2021-04-14 DIAGNOSIS — I2510 Atherosclerotic heart disease of native coronary artery without angina pectoris: Secondary | ICD-10-CM | POA: Diagnosis not present

## 2021-04-14 DIAGNOSIS — K5790 Diverticulosis of intestine, part unspecified, without perforation or abscess without bleeding: Secondary | ICD-10-CM | POA: Diagnosis not present

## 2021-04-14 NOTE — Telephone Encounter (Signed)
Rescheduled upcoming appointment per 7/4 staff message. Patient is aware of changes.

## 2021-04-17 ENCOUNTER — Other Ambulatory Visit: Payer: Medicare HMO

## 2021-04-17 ENCOUNTER — Ambulatory Visit: Payer: Medicare HMO | Admitting: Nurse Practitioner

## 2021-04-17 DIAGNOSIS — K5790 Diverticulosis of intestine, part unspecified, without perforation or abscess without bleeding: Secondary | ICD-10-CM | POA: Diagnosis not present

## 2021-04-17 DIAGNOSIS — E1151 Type 2 diabetes mellitus with diabetic peripheral angiopathy without gangrene: Secondary | ICD-10-CM | POA: Diagnosis not present

## 2021-04-17 DIAGNOSIS — Z48812 Encounter for surgical aftercare following surgery on the circulatory system: Secondary | ICD-10-CM | POA: Diagnosis not present

## 2021-04-17 DIAGNOSIS — Z89511 Acquired absence of right leg below knee: Secondary | ICD-10-CM | POA: Diagnosis not present

## 2021-04-17 DIAGNOSIS — E7800 Pure hypercholesterolemia, unspecified: Secondary | ICD-10-CM | POA: Diagnosis not present

## 2021-04-17 DIAGNOSIS — L97528 Non-pressure chronic ulcer of other part of left foot with other specified severity: Secondary | ICD-10-CM | POA: Diagnosis not present

## 2021-04-17 DIAGNOSIS — E11621 Type 2 diabetes mellitus with foot ulcer: Secondary | ICD-10-CM | POA: Diagnosis not present

## 2021-04-17 DIAGNOSIS — I110 Hypertensive heart disease with heart failure: Secondary | ICD-10-CM | POA: Diagnosis not present

## 2021-04-17 DIAGNOSIS — Z794 Long term (current) use of insulin: Secondary | ICD-10-CM | POA: Diagnosis not present

## 2021-04-17 DIAGNOSIS — Z95828 Presence of other vascular implants and grafts: Secondary | ICD-10-CM | POA: Diagnosis not present

## 2021-04-17 DIAGNOSIS — I2510 Atherosclerotic heart disease of native coronary artery without angina pectoris: Secondary | ICD-10-CM | POA: Diagnosis not present

## 2021-04-17 DIAGNOSIS — Z951 Presence of aortocoronary bypass graft: Secondary | ICD-10-CM | POA: Diagnosis not present

## 2021-04-17 DIAGNOSIS — I4510 Unspecified right bundle-branch block: Secondary | ICD-10-CM | POA: Diagnosis not present

## 2021-04-17 DIAGNOSIS — D471 Chronic myeloproliferative disease: Secondary | ICD-10-CM | POA: Diagnosis not present

## 2021-04-17 DIAGNOSIS — I509 Heart failure, unspecified: Secondary | ICD-10-CM | POA: Diagnosis not present

## 2021-04-17 DIAGNOSIS — I10 Essential (primary) hypertension: Secondary | ICD-10-CM | POA: Diagnosis not present

## 2021-04-17 DIAGNOSIS — Z8673 Personal history of transient ischemic attack (TIA), and cerebral infarction without residual deficits: Secondary | ICD-10-CM | POA: Diagnosis not present

## 2021-04-17 DIAGNOSIS — Z7982 Long term (current) use of aspirin: Secondary | ICD-10-CM | POA: Diagnosis not present

## 2021-04-19 DIAGNOSIS — I10 Essential (primary) hypertension: Secondary | ICD-10-CM | POA: Diagnosis not present

## 2021-04-19 DIAGNOSIS — E1151 Type 2 diabetes mellitus with diabetic peripheral angiopathy without gangrene: Secondary | ICD-10-CM | POA: Diagnosis not present

## 2021-04-19 DIAGNOSIS — I2510 Atherosclerotic heart disease of native coronary artery without angina pectoris: Secondary | ICD-10-CM | POA: Diagnosis not present

## 2021-04-19 DIAGNOSIS — D471 Chronic myeloproliferative disease: Secondary | ICD-10-CM | POA: Diagnosis not present

## 2021-04-19 DIAGNOSIS — L97528 Non-pressure chronic ulcer of other part of left foot with other specified severity: Secondary | ICD-10-CM | POA: Diagnosis not present

## 2021-04-19 DIAGNOSIS — Z95828 Presence of other vascular implants and grafts: Secondary | ICD-10-CM | POA: Diagnosis not present

## 2021-04-19 DIAGNOSIS — I4510 Unspecified right bundle-branch block: Secondary | ICD-10-CM | POA: Diagnosis not present

## 2021-04-19 DIAGNOSIS — Z951 Presence of aortocoronary bypass graft: Secondary | ICD-10-CM | POA: Diagnosis not present

## 2021-04-19 DIAGNOSIS — E11621 Type 2 diabetes mellitus with foot ulcer: Secondary | ICD-10-CM | POA: Diagnosis not present

## 2021-04-19 DIAGNOSIS — Z8673 Personal history of transient ischemic attack (TIA), and cerebral infarction without residual deficits: Secondary | ICD-10-CM | POA: Diagnosis not present

## 2021-04-19 DIAGNOSIS — Z48812 Encounter for surgical aftercare following surgery on the circulatory system: Secondary | ICD-10-CM | POA: Diagnosis not present

## 2021-04-19 DIAGNOSIS — I509 Heart failure, unspecified: Secondary | ICD-10-CM | POA: Diagnosis not present

## 2021-04-19 DIAGNOSIS — I110 Hypertensive heart disease with heart failure: Secondary | ICD-10-CM | POA: Diagnosis not present

## 2021-04-19 DIAGNOSIS — Z794 Long term (current) use of insulin: Secondary | ICD-10-CM | POA: Diagnosis not present

## 2021-04-19 DIAGNOSIS — Z7982 Long term (current) use of aspirin: Secondary | ICD-10-CM | POA: Diagnosis not present

## 2021-04-19 DIAGNOSIS — K5790 Diverticulosis of intestine, part unspecified, without perforation or abscess without bleeding: Secondary | ICD-10-CM | POA: Diagnosis not present

## 2021-04-19 DIAGNOSIS — E7800 Pure hypercholesterolemia, unspecified: Secondary | ICD-10-CM | POA: Diagnosis not present

## 2021-04-19 DIAGNOSIS — Z89511 Acquired absence of right leg below knee: Secondary | ICD-10-CM | POA: Diagnosis not present

## 2021-04-20 DIAGNOSIS — K5790 Diverticulosis of intestine, part unspecified, without perforation or abscess without bleeding: Secondary | ICD-10-CM | POA: Diagnosis not present

## 2021-04-20 DIAGNOSIS — E1151 Type 2 diabetes mellitus with diabetic peripheral angiopathy without gangrene: Secondary | ICD-10-CM | POA: Diagnosis not present

## 2021-04-20 DIAGNOSIS — I509 Heart failure, unspecified: Secondary | ICD-10-CM | POA: Diagnosis not present

## 2021-04-20 DIAGNOSIS — Z48812 Encounter for surgical aftercare following surgery on the circulatory system: Secondary | ICD-10-CM | POA: Diagnosis not present

## 2021-04-20 DIAGNOSIS — I4510 Unspecified right bundle-branch block: Secondary | ICD-10-CM | POA: Diagnosis not present

## 2021-04-20 DIAGNOSIS — D471 Chronic myeloproliferative disease: Secondary | ICD-10-CM | POA: Diagnosis not present

## 2021-04-20 DIAGNOSIS — L97528 Non-pressure chronic ulcer of other part of left foot with other specified severity: Secondary | ICD-10-CM | POA: Diagnosis not present

## 2021-04-20 DIAGNOSIS — I110 Hypertensive heart disease with heart failure: Secondary | ICD-10-CM | POA: Diagnosis not present

## 2021-04-20 DIAGNOSIS — Z95828 Presence of other vascular implants and grafts: Secondary | ICD-10-CM | POA: Diagnosis not present

## 2021-04-20 DIAGNOSIS — Z89511 Acquired absence of right leg below knee: Secondary | ICD-10-CM | POA: Diagnosis not present

## 2021-04-20 DIAGNOSIS — Z7982 Long term (current) use of aspirin: Secondary | ICD-10-CM | POA: Diagnosis not present

## 2021-04-20 DIAGNOSIS — Z8673 Personal history of transient ischemic attack (TIA), and cerebral infarction without residual deficits: Secondary | ICD-10-CM | POA: Diagnosis not present

## 2021-04-20 DIAGNOSIS — I2510 Atherosclerotic heart disease of native coronary artery without angina pectoris: Secondary | ICD-10-CM | POA: Diagnosis not present

## 2021-04-20 DIAGNOSIS — E7800 Pure hypercholesterolemia, unspecified: Secondary | ICD-10-CM | POA: Diagnosis not present

## 2021-04-20 DIAGNOSIS — Z951 Presence of aortocoronary bypass graft: Secondary | ICD-10-CM | POA: Diagnosis not present

## 2021-04-20 DIAGNOSIS — E11621 Type 2 diabetes mellitus with foot ulcer: Secondary | ICD-10-CM | POA: Diagnosis not present

## 2021-04-20 DIAGNOSIS — I10 Essential (primary) hypertension: Secondary | ICD-10-CM | POA: Diagnosis not present

## 2021-04-20 DIAGNOSIS — Z794 Long term (current) use of insulin: Secondary | ICD-10-CM | POA: Diagnosis not present

## 2021-04-24 DIAGNOSIS — I509 Heart failure, unspecified: Secondary | ICD-10-CM | POA: Diagnosis not present

## 2021-04-24 DIAGNOSIS — I2510 Atherosclerotic heart disease of native coronary artery without angina pectoris: Secondary | ICD-10-CM | POA: Diagnosis not present

## 2021-04-24 DIAGNOSIS — E7800 Pure hypercholesterolemia, unspecified: Secondary | ICD-10-CM | POA: Diagnosis not present

## 2021-04-24 DIAGNOSIS — Z48812 Encounter for surgical aftercare following surgery on the circulatory system: Secondary | ICD-10-CM | POA: Diagnosis not present

## 2021-04-24 DIAGNOSIS — E11621 Type 2 diabetes mellitus with foot ulcer: Secondary | ICD-10-CM | POA: Diagnosis not present

## 2021-04-24 DIAGNOSIS — L97528 Non-pressure chronic ulcer of other part of left foot with other specified severity: Secondary | ICD-10-CM | POA: Diagnosis not present

## 2021-04-24 DIAGNOSIS — Z951 Presence of aortocoronary bypass graft: Secondary | ICD-10-CM | POA: Diagnosis not present

## 2021-04-24 DIAGNOSIS — Z89511 Acquired absence of right leg below knee: Secondary | ICD-10-CM | POA: Diagnosis not present

## 2021-04-24 DIAGNOSIS — Z95828 Presence of other vascular implants and grafts: Secondary | ICD-10-CM | POA: Diagnosis not present

## 2021-04-24 DIAGNOSIS — I10 Essential (primary) hypertension: Secondary | ICD-10-CM | POA: Diagnosis not present

## 2021-04-24 DIAGNOSIS — E1151 Type 2 diabetes mellitus with diabetic peripheral angiopathy without gangrene: Secondary | ICD-10-CM | POA: Diagnosis not present

## 2021-04-24 DIAGNOSIS — Z8673 Personal history of transient ischemic attack (TIA), and cerebral infarction without residual deficits: Secondary | ICD-10-CM | POA: Diagnosis not present

## 2021-04-24 DIAGNOSIS — D471 Chronic myeloproliferative disease: Secondary | ICD-10-CM | POA: Diagnosis not present

## 2021-04-24 DIAGNOSIS — I4510 Unspecified right bundle-branch block: Secondary | ICD-10-CM | POA: Diagnosis not present

## 2021-04-24 DIAGNOSIS — K5790 Diverticulosis of intestine, part unspecified, without perforation or abscess without bleeding: Secondary | ICD-10-CM | POA: Diagnosis not present

## 2021-04-24 DIAGNOSIS — Z7982 Long term (current) use of aspirin: Secondary | ICD-10-CM | POA: Diagnosis not present

## 2021-04-24 DIAGNOSIS — Z794 Long term (current) use of insulin: Secondary | ICD-10-CM | POA: Diagnosis not present

## 2021-04-24 DIAGNOSIS — I110 Hypertensive heart disease with heart failure: Secondary | ICD-10-CM | POA: Diagnosis not present

## 2021-04-25 ENCOUNTER — Inpatient Hospital Stay: Payer: Medicare HMO | Attending: Nurse Practitioner

## 2021-04-25 ENCOUNTER — Other Ambulatory Visit: Payer: Self-pay

## 2021-04-25 ENCOUNTER — Inpatient Hospital Stay: Payer: Medicare HMO | Admitting: Hematology

## 2021-04-25 ENCOUNTER — Encounter: Payer: Self-pay | Admitting: Hematology

## 2021-04-25 VITALS — BP 144/62 | HR 76 | Temp 97.9°F | Resp 17 | Wt 168.2 lb

## 2021-04-25 DIAGNOSIS — D471 Chronic myeloproliferative disease: Secondary | ICD-10-CM | POA: Diagnosis not present

## 2021-04-25 DIAGNOSIS — D7581 Myelofibrosis: Secondary | ICD-10-CM

## 2021-04-25 DIAGNOSIS — Z79899 Other long term (current) drug therapy: Secondary | ICD-10-CM | POA: Diagnosis not present

## 2021-04-25 LAB — CBC WITH DIFFERENTIAL (CANCER CENTER ONLY)
Abs Immature Granulocytes: 0.2 10*3/uL — ABNORMAL HIGH (ref 0.00–0.07)
Basophils Absolute: 0.2 10*3/uL — ABNORMAL HIGH (ref 0.0–0.1)
Basophils Relative: 1 %
Eosinophils Absolute: 0.3 10*3/uL (ref 0.0–0.5)
Eosinophils Relative: 2 %
HCT: 36.5 % — ABNORMAL LOW (ref 39.0–52.0)
Hemoglobin: 11.9 g/dL — ABNORMAL LOW (ref 13.0–17.0)
Immature Granulocytes: 2 %
Lymphocytes Relative: 10 %
Lymphs Abs: 1.2 10*3/uL (ref 0.7–4.0)
MCH: 38.8 pg — ABNORMAL HIGH (ref 26.0–34.0)
MCHC: 32.6 g/dL (ref 30.0–36.0)
MCV: 118.9 fL — ABNORMAL HIGH (ref 80.0–100.0)
Monocytes Absolute: 0.8 10*3/uL (ref 0.1–1.0)
Monocytes Relative: 7 %
Neutro Abs: 9.6 10*3/uL — ABNORMAL HIGH (ref 1.7–7.7)
Neutrophils Relative %: 78 %
Platelet Count: 566 10*3/uL — ABNORMAL HIGH (ref 150–400)
RBC: 3.07 MIL/uL — ABNORMAL LOW (ref 4.22–5.81)
RDW: 16.7 % — ABNORMAL HIGH (ref 11.5–15.5)
WBC Count: 12.2 10*3/uL — ABNORMAL HIGH (ref 4.0–10.5)
nRBC: 0.2 % (ref 0.0–0.2)

## 2021-04-25 LAB — CMP (CANCER CENTER ONLY)
ALT: 10 U/L (ref 0–44)
AST: 14 U/L — ABNORMAL LOW (ref 15–41)
Albumin: 3.6 g/dL (ref 3.5–5.0)
Alkaline Phosphatase: 85 U/L (ref 38–126)
Anion gap: 8 (ref 5–15)
BUN: 13 mg/dL (ref 8–23)
CO2: 27 mmol/L (ref 22–32)
Calcium: 9 mg/dL (ref 8.9–10.3)
Chloride: 112 mmol/L — ABNORMAL HIGH (ref 98–111)
Creatinine: 1.05 mg/dL (ref 0.61–1.24)
GFR, Estimated: >60 mL/min (ref >60)
Glucose, Bld: 128 mg/dL — ABNORMAL HIGH (ref 70–99)
Potassium: 4 mmol/L (ref 3.5–5.1)
Sodium: 147 mmol/L — ABNORMAL HIGH (ref 135–145)
Total Bilirubin: 0.4 mg/dL (ref 0.3–1.2)
Total Protein: 6.4 g/dL — ABNORMAL LOW (ref 6.5–8.1)

## 2021-04-25 NOTE — Progress Notes (Signed)
Patient seen by Dr. Truitt Merle.

## 2021-04-25 NOTE — Progress Notes (Signed)
[place]   [phone] Fax:(336) [phone]   Clinic Follow up Note   Patient Care Team: Pcp, No as PCP - General Hilty, Nadean Corwin, MD as Consulting Physician (Cardiology) Minus Breeding, MD as Consulting Physician (Cardiology) Cameron Sprang, MD as Consulting Physician (Neurology)  Date of Service:  04/25/2021  CHIEF COMPLAINT: f/u Myelofibrosis  SUMMARY OF ONCOLOGIC HISTORY: Oncology History  Myelofibrosis ([place])  12/26/2012 Bone Marrow Biopsy   Showed hypercellularity with reticulum fibrosis, JAK-2 mutation positive, BCR/ABL negative, 71 XY in 20 metaphase, and Intermediate-1 ,DIPSS of 2. (WBC < 25; Hbg > 10;     01/02/2013 Imaging   US abdomen complete revealed Mild splenogmealy (13.3 cm) and a 2.2 x 1.7 x 1.8 cm left renal cyst    01/15/2013 Pathology Results   Integrated hematopathology report.  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     03/17/2013 Imaging   CT Chest: Crowded peribronchial markings noted, associated with linear densities involving the left base, probably representing areas of platelike atelectasis/scarring.  No mass or infiltrate seen.     03/19/2013 - 07/20/2013 Chemotherapy   Started Jakafi 20 mg bid. Dose reduced due to anemia.  Stopped due to increasing fatigue while on jakafi.     04/28/2013 - 05/01/2013 Hospital Admission   Admitted to [facility] by Dr. Francia Greaves, his cardiologist, due to the fact that he had midsternal chest pain.  He had a coronary stent placed this admission.     05/28/2013 - 05/29/2013 Hospital Admission   Admitted to [facility] and received 3 units of blood (Dr. Lendon Colonel note).     06/10/2013 Surgery   Cololonoscopy due to GIB.  C/w diverticular disease and hemorrhoids but no active bleeding.     10/05/2013 -  Chemotherapy   Started  Hydrea 500 mg daily.    03/19/2014 -  Chemotherapy   Hydrea increased to 500 mg bid wbc 17, hb 17, hct 54, plt 437    06/29/2014 Procedure   cbc shows wbc 18.8 hb 18.7 hct 58.9 plt 456. ?compliance w hydrea as MCV not elevated. symptomatic start TP weekly x 4    07/28/2014 Procedure   TP every 2 weeks if hct > 45, changed to monthly in April 2016       CURRENT THERAPY:  Hydrea $Remov'500mg'vqstve$  twice daily  INTERVAL HISTORY:  Tyler Whitehead is here for a follow up f/u of Myelofibrosis, he presents to the clinic alone.  He was scheduled to see my nurse practitioner Bella Kennedy today, but he requested to see me.  He is very frustrated about his left leg edema and pain.  Apparently he has longstanding significant peripheral vascular disease, he was recently seen by vascular surgeon Dr. Trula Slade and underwent angiogram on March 28, 2021.  It showed left superficial femoral and popliteal artery occlusion. He will likely need bypass surgery, which will be discussed on his next visit in a few weeks.  He has been taking Hydrea 500 mg twice daily, he is tolerating well.  MEDICAL HISTORY:  Past Medical History:  Diagnosis Date   CAD (coronary artery disease)    a. S/p 3V CABG 2000 with LIMA-LAD, left radial-PDA, SVG-OM1. b. 2014: stent to SVG-OM1; then found to have obstruction distal to LAD anastamosis of LIMA s/p DES.    CHF (congestive heart failure) ([place])    Diverticular disease    DM (diabetes mellitus) (  [place])    Hemorrhoid    HTN (hypertension)    Hyperlipidemia    Peripheral vascular disease ([place])    a. R femoropopliteal bypass ~2012 in [place], reportedly shown to be occluded. Now followed by Dr. [name].   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)    RBBB     SURGICAL HISTORY: Past Surgical History:  Procedure Laterality Date   ABDOMINAL ANGIOGRAM N/A 01/18/2015   Procedure: ABDOMINAL ANGIOGRAM;  Surgeon: Serafina Mitchell, MD;  Location: [facility] CATH LAB;  Service: Cardiovascular;  Laterality: N/A;   ABDOMINAL AORTAGRAM  N/A 09/21/2014   Procedure: ABDOMINAL Maxcine Ham;  Surgeon: Elam Dutch, MD;  Location: [facility] CATH LAB;  Service: Cardiovascular;  Laterality: N/A;   ABDOMINAL AORTOGRAM W/LOWER EXTREMITY N/A 03/28/2021   Procedure: ABDOMINAL AORTOGRAM W/LOWER EXTREMITY;  Surgeon: Serafina Mitchell, MD;  Location: [place] CV LAB;  Service: Cardiovascular;  Laterality: N/A;   CARDIAC CATHETERIZATION  May 2014   CORONARY ARTERY BYPASS GRAFT  2000   LM occluded, LIMA to LAD patent but distal native LAD disease, saphenous vein bypass graft to OM with 95% stenosis in the midportion, right coronary artery occluded, radial artery graft to right coronary artery patent but was diffusely small. Marginal graft stented. 7 2014. Of note he had multiple previous stents in the right coronary artery. The distal LAD was treated with angioplasty. I   LOOP RECORDER IMPLANT N/A 07/12/2014   Procedure: LOOP RECORDER IMPLANT;  Surgeon: Evans Lance, MD;  Location: [facility] CATH LAB;  Service: Cardiovascular;  Laterality: N/A;   LOWER EXTREMITY ANGIOGRAM  09/21/2014   Procedure: LOWER EXTREMITY ANGIOGRAM;  Surgeon: Elam Dutch, MD;  Location: [facility] CATH LAB;  Service: Cardiovascular;;   PERCUTANEOUS CORONARY STENT INTERVENTION (PCI-S)  April 27 2013   right leg amputation  2017   TEE WITHOUT CARDIOVERSION N/A 07/12/2014   Procedure: TRANSESOPHAGEAL ECHOCARDIOGRAM (TEE);  Surgeon: Fay Records, MD;  Location: [facility] ENDOSCOPY;  Service: Cardiovascular;  Laterality: N/A;   VEIN SURGERY      I have reviewed the social history and family history with the patient and they are unchanged from previous note.  ALLERGIES:  has No Known Allergies.  MEDICATIONS:  Current Outpatient Medications  Medication Sig Dispense Refill   aspirin 81 MG chewable tablet Chew 81 mg by mouth daily.     atorvastatin (LIPITOR) 40 MG tablet Take 40 mg by mouth at bedtime.     carvedilol (COREG) 3.125 MG tablet Take 3.125 mg by mouth 2 (two) times daily with a meal.      cilostazol (PLETAL) 100 MG tablet Take 100 mg by mouth 2 (two) times daily.     COVID-19 mRNA vaccine, Moderna, 100 MCG/0.5ML injection USE AS DIRECTED .25 mL 0   fluticasone (FLONASE) 50 MCG/ACT nasal spray Place 2 sprays into both nostrils daily as needed for rhinitis.     gabapentin (NEURONTIN) 300 MG capsule Take 300 mg by mouth at bedtime.     hydroxyurea (HYDREA) 500 MG capsule Take 500 mg by mouth 2 (two) times daily. On Monday, Wednesday, and Friday     insulin glargine (LANTUS) 100 UNIT/ML injection Inject 10 Units into the skin at bedtime.     insulin lispro (HUMALOG) 100 UNIT/ML injection Inject 4 Units into the skin daily as needed for high blood sugar.     isosorbide mononitrate (IMDUR) 60 MG 24 hr tablet Take 1-2 tablets (60-120 mg total) by mouth 2 (two) times daily. 2 in the morning and 1 in  the evening 270 tablet 3   Lancets (ONETOUCH DELICA PLUS [id number]) MISC Apply topically 2 (two) times daily.     levocetirizine (XYZAL) 5 MG tablet Take 5 mg by mouth every evening.     losartan (COZAAR) 25 MG tablet Take 25 mg by mouth at bedtime.     nitroGLYCERIN (NITROSTAT) 0.4 MG SL tablet Place 1 tablet (0.4 mg total) under the tongue every 5 (five) minutes as needed for chest pain. 25 tablet 12   ONETOUCH VERIO test strip 1 each 2 (two) times daily.     PROAIR HFA 108 (90 BASE) MCG/ACT inhaler Inhale 1 puff into the lungs every 4 (four) hours as needed for wheezing or shortness of breath.      No current facility-administered medications for this visit.    PHYSICAL EXAMINATION: ECOG PERFORMANCE STATUS: 2 - Symptomatic, <50% confined to bed  Vitals:   04/25/21 0938  BP: (!) 144/62  Pulse: 76  Resp: 17  Temp: 97.9 F (36.6 C)  SpO2: 100%   Filed Weights   04/25/21 0938  Weight: 168 lb 3.2 oz (76.3 kg)    GENERAL:alert, no distress and comfortable SKIN: skin color, texture, turgor are normal, no rashes or significant lesions except a dark skin of the left lower  extremity below knee.  The top of left big toe has an healed wound, no discharge  EYES: normal, Conjunctiva are pink and non-injected, sclera clear NECK: supple, thyroid normal size, non-tender, without nodularity LYMPH:  no palpable lymphadenopathy in the cervical, axillary  LUNGS: clear to auscultation and percussion with normal breathing effort HEART: regular rate & rhythm, (+) murmurs, and (+) 1+ pitting edema in left lower extremity  ABDOMEN:abdomen soft, non-tender and normal bowel sounds Musculoskeletal:no cyanosis of digits and no clubbing, (+) edema of the left lower extremity below knee, with decreased in temperature.   NEURO: alert & oriented x 3 with fluent speech, no focal motor/sensory deficits  LABORATORY DATA:  I have reviewed the data as listed CBC Latest Ref Rng & Units 04/25/2021 03/28/2021 03/28/2021  WBC 4.0 - 10.5 K/uL 12.2(H) - -  Hemoglobin 13.0 - 17.0 g/dL 11.9(L) 10.5(L) 11.6(L)  Hematocrit 39.0 - 52.0 % 36.5(L) 31.0(L) 34.0(L)  Platelets 150 - 400 K/uL 566(H) - -     CMP Latest Ref Rng & Units 04/25/2021 03/28/2021 03/28/2021  Glucose 70 - 99 mg/dL 128(H) 103(H) 106(H)  BUN 8 - 23 mg/dL _0 Creatinine 0.61 - 1.24 mg/dL 1.05 1.70(H) 1.90(H)  Sodium 135 - 145 mmol/L 147(H) 140 138  Potassium 3.5 - 5.1 mmol/L 4.0 4.4 7.3(HH)  Chloride 98 - 111 mmol/L 112(H) 104 103  CO2 22 - 32 mmol/L 27 - -  Calcium 8.9 - 10.3 mg/dL 9.0 - -  Total Protein 6.5 - 8.1 g/dL 6.4(L) - -  Total Bilirubin 0.3 - 1.2 mg/dL 0.4 - -  Alkaline Phos 38 - 126 U/L 85 - -  AST 15 - 41 U/L 14(L) - -  ALT 0 - 44 U/L 10 - -      RADIOGRAPHIC STUDIES: I have personally reviewed the radiological images as listed and agreed with the findings in the report. No results found.   ASSESSMENT & PLAN:  Tyler Whitehead is a 80 y.o. male with   1. 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 -We discussed that  this is not curable disease, but treatable. Some patient will develop  acute leukemia or aplastic anemia later on. -he has intermediate risk disease, which predicts median survival 6.5 years, he has been doing better than average for sure  -he is not a good candidate for stem cell transplant due to his advanced age and multiple medical comorbidities. -I discussed that he is at high risk for thrombosis secondary to his disease, he will continue aspirin and plavix  -he tried [place] for 4 months in 2014, could not tolerate due to his anemia and fatigue. -We again emphasized the importance of continued Hydrea and normalized his blood counts to prevent further stroke or thrombosis. He agrees to continue Hydrea and compliant with treat -He is currently on Hydrea 57m bid  -Lab reviewed, platelet 566K today, will increase Hydrea to 1000 mg in the morning, 500 in the evening on Mondays, Wednesdays and Friday, and 500 mg twice daily for the rest of week   2. DM, s/p right AKA -He has a right leg prosthesis, uses a walker, he lives independently. -Follow-up with Dr. HDeforest Hoyles  3. CADz s/p CABG x 3, hypertension  --Continue lisinopril, coreg, aspirin and plavix  -He will follow up with his cardiologist   4. PvDz.  -His main concern is left lower extremity edema, recent angiogram showed occlusion of superficial femoral artery and popliteal artery occlusion, Dr. BTrula Slademay offer bypass surgery   5. Dyslipidemia.  --Continue statin therapy.    6. Stroke and seizure in 07/2014 -- follow up with his neurologist    Plan; -continue Hydrea, increase to 10069min am, and 50024mn evening on MWF and continue 500 mg twice daily for the rest of week --Labs every 1 month, follow-up with me in 2 months    No problem-specific Assessment & Plan notes found for this encounter.   No orders of the defined types were placed in this encounter.  All questions were answered. The patient knows to call the clinic  with any problems, questions or concerns. No barriers to learning was detected. The total time spent in the appointment was 30 minutes.     YanTruitt MerleD 04/25/2021

## 2021-04-27 DIAGNOSIS — Z7982 Long term (current) use of aspirin: Secondary | ICD-10-CM | POA: Diagnosis not present

## 2021-04-27 DIAGNOSIS — E7800 Pure hypercholesterolemia, unspecified: Secondary | ICD-10-CM | POA: Diagnosis not present

## 2021-04-27 DIAGNOSIS — I509 Heart failure, unspecified: Secondary | ICD-10-CM | POA: Diagnosis not present

## 2021-04-27 DIAGNOSIS — Z95828 Presence of other vascular implants and grafts: Secondary | ICD-10-CM | POA: Diagnosis not present

## 2021-04-27 DIAGNOSIS — E1151 Type 2 diabetes mellitus with diabetic peripheral angiopathy without gangrene: Secondary | ICD-10-CM | POA: Diagnosis not present

## 2021-04-27 DIAGNOSIS — Z794 Long term (current) use of insulin: Secondary | ICD-10-CM | POA: Diagnosis not present

## 2021-04-27 DIAGNOSIS — Z951 Presence of aortocoronary bypass graft: Secondary | ICD-10-CM | POA: Diagnosis not present

## 2021-04-27 DIAGNOSIS — E11621 Type 2 diabetes mellitus with foot ulcer: Secondary | ICD-10-CM | POA: Diagnosis not present

## 2021-04-27 DIAGNOSIS — K5790 Diverticulosis of intestine, part unspecified, without perforation or abscess without bleeding: Secondary | ICD-10-CM | POA: Diagnosis not present

## 2021-04-27 DIAGNOSIS — I10 Essential (primary) hypertension: Secondary | ICD-10-CM | POA: Diagnosis not present

## 2021-04-27 DIAGNOSIS — I4510 Unspecified right bundle-branch block: Secondary | ICD-10-CM | POA: Diagnosis not present

## 2021-04-27 DIAGNOSIS — Z89511 Acquired absence of right leg below knee: Secondary | ICD-10-CM | POA: Diagnosis not present

## 2021-04-27 DIAGNOSIS — I110 Hypertensive heart disease with heart failure: Secondary | ICD-10-CM | POA: Diagnosis not present

## 2021-04-27 DIAGNOSIS — L97528 Non-pressure chronic ulcer of other part of left foot with other specified severity: Secondary | ICD-10-CM | POA: Diagnosis not present

## 2021-04-27 DIAGNOSIS — Z48812 Encounter for surgical aftercare following surgery on the circulatory system: Secondary | ICD-10-CM | POA: Diagnosis not present

## 2021-04-27 DIAGNOSIS — Z8673 Personal history of transient ischemic attack (TIA), and cerebral infarction without residual deficits: Secondary | ICD-10-CM | POA: Diagnosis not present

## 2021-04-27 DIAGNOSIS — D471 Chronic myeloproliferative disease: Secondary | ICD-10-CM | POA: Diagnosis not present

## 2021-04-27 DIAGNOSIS — I2510 Atherosclerotic heart disease of native coronary artery without angina pectoris: Secondary | ICD-10-CM | POA: Diagnosis not present

## 2021-05-01 ENCOUNTER — Other Ambulatory Visit: Payer: Self-pay

## 2021-05-01 ENCOUNTER — Ambulatory Visit (INDEPENDENT_AMBULATORY_CARE_PROVIDER_SITE_OTHER): Payer: Medicare HMO | Admitting: Physician Assistant

## 2021-05-01 VITALS — BP 118/59 | HR 90 | Temp 98.6°F | Resp 20 | Ht 67.0 in | Wt 167.2 lb

## 2021-05-01 DIAGNOSIS — I70248 Atherosclerosis of native arteries of left leg with ulceration of other part of lower left leg: Secondary | ICD-10-CM | POA: Diagnosis not present

## 2021-05-01 NOTE — Progress Notes (Signed)
Office Note     CC:  follow up Requesting Provider:  No ref. provider found  HPI: Tyler Whitehead is a 80 y.o. (13-Apr-1941) male who presents status post diagnostic left lower extremity arteriogram by Dr. Trula Slade on 03/28/2021 due to left great toe tip ulcer.  He has a history of left femoral to above-the-knee popliteal bypass in [place] which she states only lasted a few months.  He believes his greater saphenous vein was harvested from both legs for CABG.  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  Patient states since procedure his left great toe ulceration has healed.  It still is painful to touch however does not have any open wounds.  He is on aspirin and Pletal daily.  He denies tobacco use.    Past Medical History:  Diagnosis Date   CAD (coronary artery disease)    a. S/p 3V CABG 2000 with LIMA-LAD, left radial-PDA, SVG-OM1. b. 2014: stent to SVG-OM1; then found to have obstruction distal to LAD anastamosis of LIMA s/p DES.    CHF (congestive heart failure) ([place])    Diverticular disease    DM (diabetes mellitus) ([place])    Hemorrhoid    HTN (hypertension)    Hyperlipidemia    Peripheral vascular disease ([employer])    a. R femoropopliteal bypass ~2012 in [place], reportedly shown to be occluded. Now followed by Dr. [name].   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[place])    RBBB     Past Surgical History:  Procedure Laterality Date   ABDOMINAL ANGIOGRAM N/A 01/18/2015   Procedure: ABDOMINAL ANGIOGRAM;  Surgeon: Serafina Mitchell, MD;  Location: [facility] CATH LAB;  Service: Cardiovascular;  Laterality: N/A;   ABDOMINAL AORTAGRAM N/A 09/21/2014   Procedure: ABDOMINAL Maxcine Ham;  Surgeon: Elam Dutch, MD;  Location: [facility] CATH LAB;  Service: Cardiovascular;  Laterality: N/A;   ABDOMINAL AORTOGRAM W/LOWER EXTREMITY N/A 03/28/2021   Procedure: ABDOMINAL AORTOGRAM W/LOWER EXTREMITY;  Surgeon: Serafina Mitchell, MD;  Location: [facility] CV LAB;  Service: Cardiovascular;  Laterality: N/A;   CARDIAC  CATHETERIZATION  May 2014   CORONARY ARTERY BYPASS GRAFT  2000   LM occluded, LIMA to LAD patent but distal native LAD disease, saphenous vein bypass graft to OM with 95% stenosis in the midportion, right coronary artery occluded, radial artery graft to right coronary artery patent but was diffusely small. Marginal graft stented. 7 2014. Of note he had multiple previous stents in the right coronary artery. The distal LAD was treated with angioplasty. I   LOOP RECORDER IMPLANT N/A 07/12/2014   Procedure: LOOP RECORDER IMPLANT;  Surgeon: Evans Lance, MD;  Location: [facility] CATH LAB;  Service: Cardiovascular;  Laterality: N/A;   LOWER EXTREMITY ANGIOGRAM  09/21/2014   Procedure: LOWER EXTREMITY ANGIOGRAM;  Surgeon: Elam Dutch, MD;  Location: [facility] CATH LAB;  Service: Cardiovascular;;   PERCUTANEOUS CORONARY STENT INTERVENTION (PCI-S)  April 27 2013   right leg amputation  2017   TEE WITHOUT CARDIOVERSION N/A 07/12/2014   Procedure: TRANSESOPHAGEAL ECHOCARDIOGRAM (TEE);  Surgeon: Fay Records, MD;  Location: [facility] ENDOSCOPY;  Service: Cardiovascular;  Laterality: N/A;   VEIN SURGERY      Social History   Socioeconomic History   Marital status: Divorced    Spouse name: Not on file   Number of children: Not on file   Years of education: Not on file   Highest education level: Not on file  Occupational History   Occupation: Limo driver  Tobacco Use   Smoking  status: Never   Smokeless tobacco: Never  Substance and Sexual Activity   Alcohol use: Yes    Alcohol/week: 0.0 standard drinks    Comment: sometimes    Drug use: No   Sexual activity: Not on file  Other Topics Concern   Not on file  Social History Narrative   Lives with wife.    Social Determinants of Health   Financial Resource Strain: Not on file  Food Insecurity: Not on file  Transportation Needs: Not on file  Physical Activity: Not on file  Stress: Not on file  Social Connections: Not on file  Intimate Partner Violence: Not  on file    Family History  Problem Relation Age of Onset   Asthma Father    Heart disease Father    Hyperlipidemia Maternal Grandmother    CAD Other        Multiple siblings   Cancer Other        niece had lung cancer   Cancer Paternal Uncle        brain cancer     Current Outpatient Medications  Medication Sig Dispense Refill   aspirin 81 MG chewable tablet Chew 81 mg by mouth daily.     atorvastatin (LIPITOR) 40 MG tablet Take 40 mg by mouth at bedtime.     carvedilol (COREG) 3.125 MG tablet Take 3.125 mg by mouth 2 (two) times daily with a meal.     cilostazol (PLETAL) 100 MG tablet Take 100 mg by mouth 2 (two) times daily.     COVID-19 mRNA vaccine, Moderna, 100 MCG/0.5ML injection USE AS DIRECTED .25 mL 0   fluticasone (FLONASE) 50 MCG/ACT nasal spray Place 2 sprays into both nostrils daily as needed for rhinitis.     gabapentin (NEURONTIN) 300 MG capsule Take 300 mg by mouth at bedtime.     hydroxyurea (HYDREA) 500 MG capsule Take 500 mg by mouth 2 (two) times daily. On Monday, Wednesday, and Friday     insulin glargine (LANTUS) 100 UNIT/ML injection Inject 10 Units into the skin at bedtime.     insulin lispro (HUMALOG) 100 UNIT/ML injection Inject 4 Units into the skin daily as needed for high blood sugar.     isosorbide mononitrate (IMDUR) 60 MG 24 hr tablet Take 1-2 tablets (60-120 mg total) by mouth 2 (two) times daily. 2 in the morning and 1 in the evening 270 tablet 3   Lancets (ONETOUCH DELICA PLUS 123XX123) MISC Apply topically 2 (two) times daily.     levocetirizine (XYZAL) 5 MG tablet Take 5 mg by mouth every evening.     losartan (COZAAR) 25 MG tablet Take 25 mg by mouth at bedtime.     nitroGLYCERIN (NITROSTAT) 0.4 MG SL tablet Place 1 tablet (0.4 mg total) under the tongue every 5 (five) minutes as needed for chest pain. 25 tablet 12   ONETOUCH VERIO test strip 1 each 2 (two) times daily.     PROAIR HFA 108 (90 BASE) MCG/ACT inhaler Inhale 1 puff into the lungs  every 4 (four) hours as needed for wheezing or shortness of breath.      No current facility-administered medications for this visit.    Allergies  Allergen Reactions   Other     Other reaction(s): Unknown     REVIEW OF SYSTEMS:   '[X]'$  denotes positive finding, '[ ]'$  denotes negative finding Cardiac  Comments:  Chest pain or chest pressure:    Shortness of breath upon exertion:  Short of breath when lying flat:    Irregular heart rhythm:        Vascular    Pain in calf, thigh, or hip brought on by ambulation:    Pain in feet at night that wakes you up from your sleep:     Blood clot in your veins:    Leg swelling:         Pulmonary    Oxygen at home:    Productive cough:     Wheezing:         Neurologic    Sudden weakness in arms or legs:     Sudden numbness in arms or legs:     Sudden onset of difficulty speaking or slurred speech:    Temporary loss of vision in one eye:     Problems with dizziness:         Gastrointestinal    Blood in stool:     Vomited blood:         Genitourinary    Burning when urinating:     Blood in urine:        Psychiatric    Major depression:         Hematologic    Bleeding problems:    Problems with blood clotting too easily:        Skin    Rashes or ulcers:        Constitutional    Fever or chills:      PHYSICAL EXAMINATION:  Vitals:   05/01/21 0856  BP: (!) 118/59  Pulse: 90  Resp: 20  Temp: 98.6 F (37 C)  TempSrc: Temporal  SpO2: 99%  Weight: 167 lb 3.2 oz (75.8 kg)  Height: '5\' 7"'$  (1.702 m)    General:  WDWN in NAD; vital signs documented above Gait: Not observed HENT: WNL, normocephalic Pulmonary: normal non-labored breathing  Cardiac: regular HR Abdomen: soft, NT, no masses Skin: without rashes Vascular Exam/Pulses:  Right Left  DP aka absent  PT aka absent   Extremities: without ischemic changes, without Gangrene , without cellulitis; without open wounds;  Musculoskeletal: no muscle wasting or  atrophy  Neurologic: A&O X 3;  No focal weakness or paresthesias are detected Psychiatric:  The pt has Normal affect.    ASSESSMENT/PLAN:: 80 y.o. male s/p LLE arteriogram  - L GT tip ulcer has healed - Only option for revascularization of LLE is redo femoral to BK popliteal bypass with PTFE if he were to develop any new wounds - Encouraged patient to protect L foot - Follow up with ABI in 3 months; call/return to office sooner if rest pain or wounds develop   Dagoberto Ligas, PA-C [facility] [phone]  Clinic MD:   Trula Slade

## 2021-05-02 DIAGNOSIS — D471 Chronic myeloproliferative disease: Secondary | ICD-10-CM | POA: Diagnosis not present

## 2021-05-02 DIAGNOSIS — Z951 Presence of aortocoronary bypass graft: Secondary | ICD-10-CM | POA: Diagnosis not present

## 2021-05-02 DIAGNOSIS — I739 Peripheral vascular disease, unspecified: Secondary | ICD-10-CM | POA: Diagnosis not present

## 2021-05-02 DIAGNOSIS — I10 Essential (primary) hypertension: Secondary | ICD-10-CM | POA: Diagnosis not present

## 2021-05-02 DIAGNOSIS — Z8673 Personal history of transient ischemic attack (TIA), and cerebral infarction without residual deficits: Secondary | ICD-10-CM | POA: Diagnosis not present

## 2021-05-02 DIAGNOSIS — Z89619 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: Secondary | ICD-10-CM | POA: Diagnosis not present

## 2021-05-02 DIAGNOSIS — I25709 Atherosclerosis of coronary artery bypass graft(s), unspecified, with unspecified angina pectoris: Secondary | ICD-10-CM | POA: Diagnosis not present

## 2021-05-02 DIAGNOSIS — E1140 Type 2 diabetes mellitus with diabetic neuropathy, unspecified: Secondary | ICD-10-CM | POA: Diagnosis not present

## 2021-05-03 ENCOUNTER — Other Ambulatory Visit: Payer: Self-pay

## 2021-05-03 DIAGNOSIS — I4510 Unspecified right bundle-branch block: Secondary | ICD-10-CM | POA: Diagnosis not present

## 2021-05-03 DIAGNOSIS — Z8673 Personal history of transient ischemic attack (TIA), and cerebral infarction without residual deficits: Secondary | ICD-10-CM | POA: Diagnosis not present

## 2021-05-03 DIAGNOSIS — I70248 Atherosclerosis of native arteries of left leg with ulceration of other part of lower left leg: Secondary | ICD-10-CM

## 2021-05-03 DIAGNOSIS — D471 Chronic myeloproliferative disease: Secondary | ICD-10-CM | POA: Diagnosis not present

## 2021-05-03 DIAGNOSIS — E7800 Pure hypercholesterolemia, unspecified: Secondary | ICD-10-CM | POA: Diagnosis not present

## 2021-05-03 DIAGNOSIS — Z89511 Acquired absence of right leg below knee: Secondary | ICD-10-CM | POA: Diagnosis not present

## 2021-05-03 DIAGNOSIS — I10 Essential (primary) hypertension: Secondary | ICD-10-CM | POA: Diagnosis not present

## 2021-05-03 DIAGNOSIS — Z95828 Presence of other vascular implants and grafts: Secondary | ICD-10-CM | POA: Diagnosis not present

## 2021-05-03 DIAGNOSIS — L97528 Non-pressure chronic ulcer of other part of left foot with other specified severity: Secondary | ICD-10-CM | POA: Diagnosis not present

## 2021-05-03 DIAGNOSIS — I2510 Atherosclerotic heart disease of native coronary artery without angina pectoris: Secondary | ICD-10-CM | POA: Diagnosis not present

## 2021-05-03 DIAGNOSIS — Z48812 Encounter for surgical aftercare following surgery on the circulatory system: Secondary | ICD-10-CM | POA: Diagnosis not present

## 2021-05-03 DIAGNOSIS — I509 Heart failure, unspecified: Secondary | ICD-10-CM | POA: Diagnosis not present

## 2021-05-03 DIAGNOSIS — E1151 Type 2 diabetes mellitus with diabetic peripheral angiopathy without gangrene: Secondary | ICD-10-CM | POA: Diagnosis not present

## 2021-05-03 DIAGNOSIS — I110 Hypertensive heart disease with heart failure: Secondary | ICD-10-CM | POA: Diagnosis not present

## 2021-05-03 DIAGNOSIS — Z794 Long term (current) use of insulin: Secondary | ICD-10-CM | POA: Diagnosis not present

## 2021-05-03 DIAGNOSIS — Z7982 Long term (current) use of aspirin: Secondary | ICD-10-CM | POA: Diagnosis not present

## 2021-05-03 DIAGNOSIS — Z951 Presence of aortocoronary bypass graft: Secondary | ICD-10-CM | POA: Diagnosis not present

## 2021-05-03 DIAGNOSIS — K5790 Diverticulosis of intestine, part unspecified, without perforation or abscess without bleeding: Secondary | ICD-10-CM | POA: Diagnosis not present

## 2021-05-03 DIAGNOSIS — E11621 Type 2 diabetes mellitus with foot ulcer: Secondary | ICD-10-CM | POA: Diagnosis not present

## 2021-05-05 ENCOUNTER — Ambulatory Visit: Payer: Medicare HMO | Admitting: Podiatry

## 2021-05-08 DIAGNOSIS — E1151 Type 2 diabetes mellitus with diabetic peripheral angiopathy without gangrene: Secondary | ICD-10-CM | POA: Diagnosis not present

## 2021-05-08 DIAGNOSIS — Z951 Presence of aortocoronary bypass graft: Secondary | ICD-10-CM | POA: Diagnosis not present

## 2021-05-08 DIAGNOSIS — E11621 Type 2 diabetes mellitus with foot ulcer: Secondary | ICD-10-CM | POA: Diagnosis not present

## 2021-05-08 DIAGNOSIS — I509 Heart failure, unspecified: Secondary | ICD-10-CM | POA: Diagnosis not present

## 2021-05-08 DIAGNOSIS — Z95828 Presence of other vascular implants and grafts: Secondary | ICD-10-CM | POA: Diagnosis not present

## 2021-05-08 DIAGNOSIS — L97528 Non-pressure chronic ulcer of other part of left foot with other specified severity: Secondary | ICD-10-CM | POA: Diagnosis not present

## 2021-05-08 DIAGNOSIS — I2510 Atherosclerotic heart disease of native coronary artery without angina pectoris: Secondary | ICD-10-CM | POA: Diagnosis not present

## 2021-05-08 DIAGNOSIS — I110 Hypertensive heart disease with heart failure: Secondary | ICD-10-CM | POA: Diagnosis not present

## 2021-05-08 DIAGNOSIS — K5790 Diverticulosis of intestine, part unspecified, without perforation or abscess without bleeding: Secondary | ICD-10-CM | POA: Diagnosis not present

## 2021-05-08 DIAGNOSIS — I4510 Unspecified right bundle-branch block: Secondary | ICD-10-CM | POA: Diagnosis not present

## 2021-05-08 DIAGNOSIS — Z48812 Encounter for surgical aftercare following surgery on the circulatory system: Secondary | ICD-10-CM | POA: Diagnosis not present

## 2021-05-08 DIAGNOSIS — Z7982 Long term (current) use of aspirin: Secondary | ICD-10-CM | POA: Diagnosis not present

## 2021-05-08 DIAGNOSIS — Z89511 Acquired absence of right leg below knee: Secondary | ICD-10-CM | POA: Diagnosis not present

## 2021-05-08 DIAGNOSIS — D471 Chronic myeloproliferative disease: Secondary | ICD-10-CM | POA: Diagnosis not present

## 2021-05-08 DIAGNOSIS — Z794 Long term (current) use of insulin: Secondary | ICD-10-CM | POA: Diagnosis not present

## 2021-05-08 DIAGNOSIS — E7800 Pure hypercholesterolemia, unspecified: Secondary | ICD-10-CM | POA: Diagnosis not present

## 2021-05-08 DIAGNOSIS — I10 Essential (primary) hypertension: Secondary | ICD-10-CM | POA: Diagnosis not present

## 2021-05-08 DIAGNOSIS — Z8673 Personal history of transient ischemic attack (TIA), and cerebral infarction without residual deficits: Secondary | ICD-10-CM | POA: Diagnosis not present

## 2021-05-09 ENCOUNTER — Other Ambulatory Visit: Payer: Self-pay

## 2021-05-09 ENCOUNTER — Ambulatory Visit (INDEPENDENT_AMBULATORY_CARE_PROVIDER_SITE_OTHER): Payer: Medicare HMO | Admitting: Podiatry

## 2021-05-09 ENCOUNTER — Encounter: Payer: Self-pay | Admitting: Podiatry

## 2021-05-09 DIAGNOSIS — B351 Tinea unguium: Secondary | ICD-10-CM | POA: Diagnosis not present

## 2021-05-09 DIAGNOSIS — Z89611 Acquired absence of right leg above knee: Secondary | ICD-10-CM | POA: Diagnosis not present

## 2021-05-09 DIAGNOSIS — E1151 Type 2 diabetes mellitus with diabetic peripheral angiopathy without gangrene: Secondary | ICD-10-CM | POA: Diagnosis not present

## 2021-05-10 NOTE — Progress Notes (Signed)
Subjective:  Patient ID: Tyler Whitehead, male    DOB: March 28, 1941,  MRN: [id number]  Tyler Whitehead presents to clinic today for high risk foot care. 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  His wound of the left great toe has healed. He has followed up with Dr. Trula Slade and had an abdominal aortogram performed on 03/28/2021. Patient states he will think about having the revascularization done if he develops any new wounds. He denies any development of wounds.   He states he is attempting to obtain a motorized scooter.  Patient did not check blood glucose today. States last A1c was 12.9%.  PCP is Wenda Low, [place] , and last visit was May 02, 2021.  Allergies  Allergen Reactions   Other     Other reaction(s): Unknown    Review of Systems: Negative except as noted in the HPI. Objective:   Constitutional Tyler Whitehead is a pleasant 80 y.o. African American male, in NAD. AAO x 3.   Vascular Capillary refill time to remaining digits delayed left lower extremity. Nonpalpable pedal pulse(s) left lower extremity. Pedal hair absent. Lower extremity skin temperature gradient warm to cool. No pain with calf compression b/l. Trace edema noted left lower extremity. Evidence of chronic venous insufficiency left lower extremity. No ischemia or gangrene noted left lower extremity. No cyanosis or clubbing noted.  Neurologic Normal speech. Oriented to person, place, and time. Protective sensation diminished with 10 gram monofilament left lower extremity. Vibratory sensation diminished left lower extremity.  Dermatologic No open wounds left lower extremity. No interdigital macerations left lower extremity. Toenails L 3rd toe and L 4th toe elongated, discolored, dystrophic, thickened, and crumbly with subungual debris and tenderness to dorsal palpation. No  hyperkeratotic nor porokeratotic lesions present on today's visit.  Orthopedic: Muscle strength 5/5 to all LE muscle groups of left lower extremity. No pain crepitus or joint limitation noted with ROM left lower extremity. Lower extremity amputation(s): above knee amputation right lower extremity. Utilizes walker for ambulation assistance.   Post-treatment of toenails 3 and 4 left foot:           LOWER EXTREMITY DOPPLER STUDY Patient Name: Tyler Whitehead Date of Exam: 03/20/2021 Medical Rec #: [id number] Accession #: [id number] Date of Birth: [birth year] Patient Gender: M Patient Age: 80Y Exam [facility]: [facility] Procedure: VAS [place] ABI WITH/WO TBI Referring Phys: [id number] Butch Penny [facility] Indications: Claudication, rest pain, and peripheral artery disease. High Risk Factors: Hypertension, hyperlipidemia, coronary artery disease. Other Factors: Prior left leg bypass graft, known occlusion ,and right leg above knee amputation. Performing Technologist: Alvia Grove RVT Examination Guidelines: A complete evaluation includes at minimum, Doppler waveform signals and systolic blood pressure reading at the level of bilateral brachial, anterior tibial, and posterior tibial arteries, when vessel segments are accessible. Bilateral testing is considered an integral part of a complete examination. Photoelectric Plethysmograph (PPG) waveforms and toe systolic pressure readings are included as required and additional duplex testing as needed. Limited examinations for reoccurring indications may be performed as noted.  Right Rt Pressure (mmHg) Index Waveform Comment Brachial 134  Left Lt Pressure (mmHg) Index Waveform Comment  Brachial 132 PTA 19 0.14 dampened monophasic  PERO 25 0.19 dampened monophasic  DP dampened monophasic not audible  ABI/TBI Today's ABI Today's TBI Previous ABI Previous TBI Right AKA AKA Left  0.19             absent        0.30                    0 Final  Radiographs: None Assessment:   1. Onychomycosis   2. 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 Type II diabetes mellitus with peripheral circulatory disorder (HCC)    Plan:  -Wound left hallux has healed. He presents with no new wounds/developments today.  He has followed up with Vascular Team and has discussed options. Patient states if he develops a new problem, he will proceed with revascularization. -Patient instructed to keep left foot clean, dry well and moisturize daily. -He will contact PCP regarding prescription for motorized scooter. -Patient to continue soft, supportive shoe gear daily. -Mycotic toenails were debrided in length and girth L 3rd toe and L 4th toe with sterile nail nippers and dremel without iatrogenic bleeding. Continue daily use of topical compounded antifungal medication from [place] as instructed. -Patient/POA to call should there be question/concern in the interim.  Return in about 9 weeks (around 07/11/2021).  Marzetta Board, DPM

## 2021-05-12 DIAGNOSIS — Z8673 Personal history of transient ischemic attack (TIA), and cerebral infarction without residual deficits: Secondary | ICD-10-CM | POA: Diagnosis not present

## 2021-05-12 DIAGNOSIS — I739 Peripheral vascular disease, unspecified: Secondary | ICD-10-CM | POA: Diagnosis not present

## 2021-05-12 DIAGNOSIS — G8190 Hemiplegia, unspecified affecting unspecified side: Secondary | ICD-10-CM | POA: Diagnosis not present

## 2021-05-12 DIAGNOSIS — Z89619 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: Secondary | ICD-10-CM | POA: Diagnosis not present

## 2021-05-12 DIAGNOSIS — E1140 Type 2 diabetes mellitus with diabetic neuropathy, unspecified: Secondary | ICD-10-CM | POA: Diagnosis not present

## 2021-05-25 ENCOUNTER — Other Ambulatory Visit: Payer: Self-pay

## 2021-05-25 ENCOUNTER — Inpatient Hospital Stay: Payer: Medicare HMO | Attending: Nurse Practitioner

## 2021-05-25 DIAGNOSIS — D471 Chronic myeloproliferative disease: Secondary | ICD-10-CM | POA: Diagnosis not present

## 2021-05-25 DIAGNOSIS — D7581 Myelofibrosis: Secondary | ICD-10-CM

## 2021-05-25 LAB — CBC WITH DIFFERENTIAL (CANCER CENTER ONLY)
Abs Immature Granulocytes: 0.08 10*3/uL — ABNORMAL HIGH (ref 0.00–0.07)
Basophils Absolute: 0.1 10*3/uL (ref 0.0–0.1)
Basophils Relative: 1 %
Eosinophils Absolute: 0.2 10*3/uL (ref 0.0–0.5)
Eosinophils Relative: 3 %
HCT: 31.9 % — ABNORMAL LOW (ref 39.0–52.0)
Hemoglobin: 10.7 g/dL — ABNORMAL LOW (ref 13.0–17.0)
Immature Granulocytes: 1 %
Lymphocytes Relative: 9 %
Lymphs Abs: 0.8 10*3/uL (ref 0.7–4.0)
MCH: 38.2 pg — ABNORMAL HIGH (ref 26.0–34.0)
MCHC: 33.5 g/dL (ref 30.0–36.0)
MCV: 113.9 fL — ABNORMAL HIGH (ref 80.0–100.0)
Monocytes Absolute: 0.6 10*3/uL (ref 0.1–1.0)
Monocytes Relative: 6 %
Neutro Abs: 7.5 10*3/uL (ref 1.7–7.7)
Neutrophils Relative %: 80 %
Platelet Count: 465 10*3/uL — ABNORMAL HIGH (ref 150–400)
RBC: 2.8 MIL/uL — ABNORMAL LOW (ref 4.22–5.81)
RDW: 16.8 % — ABNORMAL HIGH (ref 11.5–15.5)
WBC Count: 9.3 10*3/uL (ref 4.0–10.5)
nRBC: 0 % (ref 0.0–0.2)

## 2021-06-26 ENCOUNTER — Inpatient Hospital Stay: Payer: Medicare HMO

## 2021-06-26 ENCOUNTER — Inpatient Hospital Stay: Payer: Medicare HMO | Attending: Nurse Practitioner | Admitting: Hematology

## 2021-06-26 ENCOUNTER — Encounter: Payer: Self-pay | Admitting: Hematology

## 2021-06-26 ENCOUNTER — Other Ambulatory Visit: Payer: Self-pay

## 2021-06-26 VITALS — BP 152/55 | HR 87 | Temp 98.2°F | Resp 20 | Ht 67.0 in | Wt 170.6 lb

## 2021-06-26 DIAGNOSIS — Z8673 Personal history of transient ischemic attack (TIA), and cerebral infarction without residual deficits: Secondary | ICD-10-CM | POA: Diagnosis not present

## 2021-06-26 DIAGNOSIS — D471 Chronic myeloproliferative disease: Secondary | ICD-10-CM | POA: Diagnosis not present

## 2021-06-26 DIAGNOSIS — Z79899 Other long term (current) drug therapy: Secondary | ICD-10-CM | POA: Diagnosis not present

## 2021-06-26 DIAGNOSIS — D7581 Myelofibrosis: Secondary | ICD-10-CM

## 2021-06-26 LAB — CMP (CANCER CENTER ONLY)
ALT: 10 U/L (ref 0–44)
AST: 15 U/L (ref 15–41)
Albumin: 3.7 g/dL (ref 3.5–5.0)
Alkaline Phosphatase: 93 U/L (ref 38–126)
Anion gap: 9 (ref 5–15)
BUN: 19 mg/dL (ref 8–23)
CO2: 25 mmol/L (ref 22–32)
Calcium: 9.3 mg/dL (ref 8.9–10.3)
Chloride: 112 mmol/L — ABNORMAL HIGH (ref 98–111)
Creatinine: 1.09 mg/dL (ref 0.61–1.24)
GFR, Estimated: >60 mL/min (ref >60)
Glucose, Bld: 234 mg/dL — ABNORMAL HIGH (ref 70–99)
Potassium: 4 mmol/L (ref 3.5–5.1)
Sodium: 146 mmol/L — ABNORMAL HIGH (ref 135–145)
Total Bilirubin: 0.5 mg/dL (ref 0.3–1.2)
Total Protein: 6.3 g/dL — ABNORMAL LOW (ref 6.5–8.1)

## 2021-06-26 LAB — CBC WITH DIFFERENTIAL (CANCER CENTER ONLY)
Abs Immature Granulocytes: 0.11 10*3/uL — ABNORMAL HIGH (ref 0.00–0.07)
Basophils Absolute: 0.1 10*3/uL (ref 0.0–0.1)
Basophils Relative: 1 %
Eosinophils Absolute: 0.2 10*3/uL (ref 0.0–0.5)
Eosinophils Relative: 2 %
HCT: 31.7 % — ABNORMAL LOW (ref 39.0–52.0)
Hemoglobin: 10.9 g/dL — ABNORMAL LOW (ref 13.0–17.0)
Immature Granulocytes: 1 %
Lymphocytes Relative: 7 %
Lymphs Abs: 0.8 10*3/uL (ref 0.7–4.0)
MCH: 38.1 pg — ABNORMAL HIGH (ref 26.0–34.0)
MCHC: 34.4 g/dL (ref 30.0–36.0)
MCV: 110.8 fL — ABNORMAL HIGH (ref 80.0–100.0)
Monocytes Absolute: 0.5 10*3/uL (ref 0.1–1.0)
Monocytes Relative: 5 %
Neutro Abs: 9.1 10*3/uL — ABNORMAL HIGH (ref 1.7–7.7)
Neutrophils Relative %: 84 %
Platelet Count: 476 10*3/uL — ABNORMAL HIGH (ref 150–400)
RBC: 2.86 MIL/uL — ABNORMAL LOW (ref 4.22–5.81)
RDW: 17.3 % — ABNORMAL HIGH (ref 11.5–15.5)
WBC Count: 10.8 10*3/uL — ABNORMAL HIGH (ref 4.0–10.5)
nRBC: 0.2 % (ref 0.0–0.2)

## 2021-06-26 MED ORDER — HYDROXYUREA 500 MG PO CAPS: ORAL_CAPSULE | ORAL | 3 refills | Status: DC

## 2021-06-26 NOTE — Progress Notes (Addendum)
[place]   [phone]) [phone] [phone]) [phone]   Clinic Follow up Note   Patient Care Team: Wenda Low, MD as PCP - General (Internal Medicine) Debara Pickett Nadean Corwin, MD as Consulting Physician (Cardiology) Minus Breeding, MD as Consulting Physician (Cardiology) Cameron Sprang, MD as Consulting Physician (Neurology)  Date of Service:  06/26/2021  CHIEF COMPLAINT: f/u of myelofibrosis  CURRENT THERAPY:  Hydrea 1089m am and 5038mpm daily  ASSESSMENT & PLAN:  Tyler ZUERCHERs a [age].o. male with   1. 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 -We discussed that this is not curable disease, but treatable. Some patient will develop acute leukemia or aplastic anemia later on. -he has intermediate risk disease, which predicts median survival 6.5 years, he has been doing better than average for sure  -he is not a good candidate for stem cell transplant due to his advanced age and multiple medical comorbidities. -I discussed that he is at high risk for thrombosis secondary to his disease, he will continue aspirin and plavix  -he tried JaFranceor 4 months in 2014, could not tolerate due to his anemia and fatigue. -He is aware of the importance of continued Hydrea and normalized his blood counts to prevent further stroke or thrombosis.  -He is currently on Hydrea to 1000 mg in the morning, 500 mg in the evening daily because of a miscommunication. -labs reviewed, Hg 10.9 and plt 476k today (06/26/21), will continue current dose hydrea    2. DM, s/p right AKA -He has a right leg prosthesis, uses a walker, he lives independently. -Follow-up with Dr. HuDeforest Hoyles 3. CADz s/p CABG x 3, hypertension  --Continue lisinopril, coreg, aspirin and plavix  -He will follow up with his cardiologist   4. PvDz.  -His main concern is left lower extremity edema, recent angiogram showed  occlusion of superficial femoral artery and popliteal artery occlusion, Dr. BrTrula Sladeay offer bypass surgery   5. Dyslipidemia.  -Continue statin therapy.    6. Stroke and seizure in 07/2014 -follow up with his neurologist     Plan: -continue Hydrea, 100071mn am, and 500m4m evening daily, I refilled for him today  -Labs in 2 months  -labs and f/u in 4 months -He has copay for lab here, will call his PCP to see if they can do his lab there    No problem-specific Assessment & Plan notes found for this encounter.   SUMMARY OF ONCOLOGIC HISTORY: Oncology History  Myelofibrosis (HCC)[place]/21/2014 Bone Marrow Biopsy   Showed hypercellularity with reticulum fibrosis, JAK-2 mutation positive, BCR/ABL negative, 46 X36in 20 metaphase, and Intermediate-1 ,DIPSS of 2. (WBC < 25; Hbg > 10;    01/02/2013 Imaging   US a[place]omen complete revealed Mild splenogmealy (13.3 cm) and a 2.2 x 1.7 x 1.8 cm left renal cyst   01/15/2013 Pathology Results   Integrated hematopathology report.  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    03/17/2013 Imaging   CT Chest: Crowded peribronchial markings noted, associated with linear densities involving the left base, probably representing areas of platelike atelectasis/scarring.  No mass or infiltrate seen.    03/19/2013 - 07/20/2013 Chemotherapy   Started Jakafi 20 mg bid. Dose reduced due to anemia.  Stopped due to increasing fatigue while on jakafi.    04/28/2013 - 05/01/2013 Hospital  Admission   Admitted to [facility] by Dr. Francia Greaves, his cardiologist, due to the fact that he had midsternal chest pain.  He had a coronary stent placed this admission.    05/28/2013 - 05/29/2013 Hospital Admission   Admitted to [facility] and received 3 units of blood (Dr. Lendon Colonel note).    06/10/2013 Surgery    Cololonoscopy due to GIB.  C/w diverticular disease and hemorrhoids but no active bleeding.    10/05/2013 -  Chemotherapy   Started Hydrea 500 mg daily.   03/19/2014 -  Chemotherapy   Hydrea increased to 500 mg bid wbc 17, hb 17, hct 54, plt 437   06/29/2014 Procedure   cbc shows wbc 18.8 hb 18.7 hct 58.9 plt 456. ?compliance w hydrea as MCV not elevated. symptomatic start TP weekly x 4   07/28/2014 Procedure   TP every 2 weeks if hct > 45, changed to monthly in April 2016      INTERVAL HISTORY:  Tyler Whitehead is here for a follow up of myelofibrosis. He was last seen by me on 04/25/21. He presents to the clinic accompanied by his ex-wife. He reports the pain in his leg is intermittent. He expressed frustration with paying a copay for his lab work every times he comes here. He notes his PCP does not charge a copay.   All other systems were reviewed with the patient and are negative.  MEDICAL HISTORY:  Past Medical History:  Diagnosis Date   CAD (coronary artery disease)    a. S/p 3V CABG 2000 with LIMA-LAD, left radial-PDA, SVG-OM1. b. 2014: stent to SVG-OM1; then found to have obstruction distal to LAD anastamosis of LIMA s/p DES.    CHF (congestive heart failure) ([place])    Diverticular disease    DM (diabetes mellitus) ([place])    Hemorrhoid    HTN (hypertension)    Hyperlipidemia    Peripheral vascular disease ([place])    a. R femoropopliteal bypass ~2012 in [place], reportedly shown to be occluded. Now followed by Dr. [name].   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[place])    RBBB     SURGICAL HISTORY: Past Surgical History:  Procedure Laterality Date   ABDOMINAL ANGIOGRAM N/A 01/18/2015   Procedure: ABDOMINAL ANGIOGRAM;  Surgeon: Serafina Mitchell, MD;  Location: [facility] CATH LAB;  Service: Cardiovascular;  Laterality: N/A;   ABDOMINAL AORTAGRAM N/A 09/21/2014   Procedure: ABDOMINAL Maxcine Ham;  Surgeon: Elam Dutch, MD;  Location: [facility] CATH LAB;  Service: Cardiovascular;  Laterality: N/A;   ABDOMINAL  AORTOGRAM W/LOWER EXTREMITY N/A 03/28/2021   Procedure: ABDOMINAL AORTOGRAM W/LOWER EXTREMITY;  Surgeon: Serafina Mitchell, MD;  Location: [place] CV LAB;  Service: Cardiovascular;  Laterality: N/A;   CARDIAC CATHETERIZATION  May 2014   CORONARY ARTERY BYPASS GRAFT  2000   LM occluded, LIMA to LAD patent but distal native LAD disease, saphenous vein bypass graft to OM with 95% stenosis in the midportion, right coronary artery occluded, radial artery graft to right coronary artery patent but was diffusely small. Marginal graft stented. 7 2014. Of note he had multiple previous stents in the right coronary artery. The distal LAD was treated with angioplasty. I   LOOP RECORDER IMPLANT N/A 07/12/2014   Procedure: LOOP RECORDER IMPLANT;  Surgeon: Evans Lance, MD;  Location: [facility] [place] CATH LAB;  Service: Cardiovascular;  Laterality: N/A;   LOWER EXTREMITY ANGIOGRAM  09/21/2014   Procedure: LOWER EXTREMITY ANGIOGRAM;  Surgeon: Elam Dutch, MD;  Location: [facility] CATH  LAB;  Service: Cardiovascular;;   PERCUTANEOUS CORONARY STENT INTERVENTION (PCI-S)  April 27 2013   right leg amputation  2017   TEE WITHOUT CARDIOVERSION N/A 07/12/2014   Procedure: TRANSESOPHAGEAL ECHOCARDIOGRAM (TEE);  Surgeon: Fay Records, MD;  Location: [facility] ENDOSCOPY;  Service: Cardiovascular;  Laterality: N/A;   VEIN SURGERY      I have reviewed the social history and family history with the patient and they are unchanged from previous note.  ALLERGIES:  is allergic to other.  MEDICATIONS:  Current Outpatient Medications  Medication Sig Dispense Refill   aspirin 81 MG chewable tablet Chew 81 mg by mouth daily.     atorvastatin (LIPITOR) 40 MG tablet Take 40 mg by mouth at bedtime.     carvedilol (COREG) 3.125 MG tablet Take 3.125 mg by mouth 2 (two) times daily with a meal.     cilostazol (PLETAL) 100 MG tablet Take 100 mg by mouth 2 (two) times daily.     COVID-19 mRNA vaccine, Moderna, 100 MCG/0.5ML injection USE AS DIRECTED .25 mL  0   fluticasone (FLONASE) 50 MCG/ACT nasal spray Place 2 sprays into both nostrils daily as needed for rhinitis.     gabapentin (NEURONTIN) 300 MG capsule Take 300 mg by mouth at bedtime.     hydroxyurea (HYDREA) 500 MG capsule Take 2 capsules in morning and 1 capsule in evening 90 capsule 3   insulin glargine (LANTUS) 100 UNIT/ML injection Inject 10 Units into the skin at bedtime.     insulin lispro (HUMALOG) 100 UNIT/ML injection Inject 4 Units into the skin daily as needed for high blood sugar.     isosorbide mononitrate (IMDUR) 60 MG 24 hr tablet Take 1-2 tablets (60-120 mg total) by mouth 2 (two) times daily. 2 in the morning and 1 in the evening 270 tablet 3   Lancets (ONETOUCH DELICA PLUS [id number]) MISC Apply topically 2 (two) times daily.     levocetirizine (XYZAL) 5 MG tablet Take 5 mg by mouth every evening.     losartan (COZAAR) 25 MG tablet Take 25 mg by mouth at bedtime.     nitroGLYCERIN (NITROSTAT) 0.4 MG SL tablet Place 1 tablet (0.4 mg total) under the tongue every 5 (five) minutes as needed for chest pain. 25 tablet 12   ONETOUCH VERIO test strip 1 each 2 (two) times daily.     PROAIR HFA 108 (90 BASE) MCG/ACT inhaler Inhale 1 puff into the lungs every 4 (four) hours as needed for wheezing or shortness of breath.      No current facility-administered medications for this visit.    PHYSICAL EXAMINATION: ECOG PERFORMANCE STATUS: 2 - Symptomatic, <50% confined to bed  Vitals:   06/26/21 0952  BP: (!) 152/55  Pulse: 87  Resp: 20  Temp: 98.2 F (36.8 C)  SpO2: 100%   Wt Readings from Last 3 Encounters:  06/26/21 170 lb 9.6 oz (77.4 kg)  05/01/21 167 lb 3.2 oz (75.8 kg)  04/25/21 168 lb 3.2 oz (76.3 kg)     GENERAL:alert, no distress and comfortable SKIN: skin color normal, no rashes or significant lesions EYES: normal, Conjunctiva are pink and non-injected, sclera clear  NEURO: alert & oriented x 3 with fluent speech  LABORATORY DATA:  I have reviewed the data  as listed CBC Latest Ref Rng & Units 06/26/2021 05/25/2021 04/25/2021  WBC 4.0 - 10.5 K/uL 10.8(H) 9.3 12.2(H)  Hemoglobin 13.0 - 17.0 g/dL 10.9(L) 10.7(L) 11.9(L)  Hematocrit 39.0 - 52.0 % 31.7(L)  31.9(L) 36.5(L)  Platelets 150 - 400 K/uL 476(H) 465(H) 566(H)     CMP Latest Ref Rng & Units 06/26/2021 04/25/2021 03/28/2021  Glucose 70 - 99 mg/dL 234(H) 128(H) 103(H)  BUN 8 - 23 mg/dL _0 Creatinine 0.61 - 1.24 mg/dL 1.09 1.05 1.70(H)  Sodium 135 - 145 mmol/L 146(H) 147(H) 140  Potassium 3.5 - 5.1 mmol/L 4.0 4.0 4.4  Chloride 98 - 111 mmol/L 112(H) 112(H) 104  CO2 22 - 32 mmol/L 25 27 -  Calcium 8.9 - 10.3 mg/dL 9.3 9.0 -  Total Protein 6.5 - 8.1 g/dL 6.3(L) 6.4(L) -  Total Bilirubin 0.3 - 1.2 mg/dL 0.5 0.4 -  Alkaline Phos 38 - 126 U/L 93 85 -  AST 15 - 41 U/L 15 14(L) -  ALT 0 - 44 U/L 10 10 -      RADIOGRAPHIC STUDIES: I have personally reviewed the radiological images as listed and agreed with the findings in the report. No results found.    No orders of the defined types were placed in this encounter.  All questions were answered. The patient knows to call the clinic with any problems, questions or concerns. No barriers to learning was detected. The total time spent in the appointment was 25 minutes.     Truitt Merle, MD 06/26/2021   I, Wilburn Mylar, am acting as scribe for Truitt Merle, MD.   I have reviewed the above documentation for accuracy and completeness, and I agree with the above.

## 2021-07-06 DIAGNOSIS — D471 Chronic myeloproliferative disease: Secondary | ICD-10-CM | POA: Diagnosis not present

## 2021-07-17 ENCOUNTER — Other Ambulatory Visit: Payer: Self-pay

## 2021-07-17 ENCOUNTER — Encounter: Payer: Self-pay | Admitting: Podiatry

## 2021-07-17 ENCOUNTER — Ambulatory Visit (INDEPENDENT_AMBULATORY_CARE_PROVIDER_SITE_OTHER): Payer: Medicare HMO | Admitting: Podiatry

## 2021-07-17 DIAGNOSIS — Z89611 Acquired absence of right leg above knee: Secondary | ICD-10-CM

## 2021-07-17 DIAGNOSIS — E1151 Type 2 diabetes mellitus with diabetic peripheral angiopathy without gangrene: Secondary | ICD-10-CM

## 2021-07-17 DIAGNOSIS — B351 Tinea unguium: Secondary | ICD-10-CM | POA: Diagnosis not present

## 2021-07-17 NOTE — Progress Notes (Signed)
Subjective:  Patient ID: Tyler Whitehead, male    DOB: Aug 02, 1941,  MRN: [id number]  Tyler Whitehead presents to clinic today for high risk foot care. 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  He is followed closely by Vascular Surgery and has follow up appt on 08/07/2021.  Patient states his left leg is swollen. He states he has not worn his compression hose today.  He states he did file his toenails with a file.  He is still waiting on delivery of his motorized scooter.  Patient states blood glucose was 96 mg/dl today. States last A1c was 12.9%.  PCP is Wenda Low, MD , and last visit was May 02, 2021.  Allergies  Allergen Reactions   Other     Other reaction(s): Unknown    Review of Systems: Negative except as noted in the HPI. Objective:   Constitutional Tyler Whitehead is a pleasant 80 y.o. African American male, in NAD. AAO x 3.   Vascular Capillary refill time to remaining digits delayed left lower extremity. Nonpalpable pedal pulse(s) left lower extremity. Pedal hair absent. Lower extremity skin temperature gradient warm to cool. No pain with calf compression b/l. +1 pitting edema left lower extremity. Evidence of chronic venous insufficiency left lower extremity. No ischemia or gangrene noted left lower extremity. No cyanosis or clubbing noted.  Neurologic Normal speech. Oriented to person, place, and time. Protective sensation diminished with 10 gram monofilament left lower extremity. Vibratory sensation diminished left lower extremity.  Dermatologic No open wounds left lower extremity. No interdigital macerations left lower extremity. Toenails L hallux, L 2nd toe, L 3rd toe, and L 4th toe thickened, discolored, dystrophic, thickened, and crumbly with subungual debris and tenderness to dorsal palpation. No hyperkeratotic nor porokeratotic lesions present on today's visit.  Orthopedic: Muscle strength 5/5 to all LE  muscle groups of left lower extremity. No pain crepitus or joint limitation noted with ROM left lower extremity. Lower extremity amputation(s): above knee amputation right lower extremity. Utilizes walker for ambulation assistance.   LOWER EXTREMITY DOPPLER STUDY Patient Name: Tyler Whitehead Date of Exam: 03/20/2021 Medical Rec #: [id number] Accession #: [id number] Date of Birth: 1941-01-19 Patient Gender: M Patient Age: 80Y Exam [facility] Procedure: VAS [place] ABI WITH/WO TBI Referring Phys: [id number] Butch Penny [facility] Indications: Claudication, rest pain, and peripheral artery disease. High Risk Factors: Hypertension, hyperlipidemia, coronary artery disease. Other Factors: Prior left leg bypass graft, known occlusion ,and right leg above knee amputation. Performing Technologist: Alvia Grove RVT Examination Guidelines: A complete evaluation includes at minimum, Doppler waveform signals and systolic blood pressure reading at the level of bilateral brachial, anterior tibial, and posterior tibial arteries, when vessel segments are accessible. Bilateral testing is considered an integral part of a complete examination. Photoelectric Plethysmograph (PPG) waveforms and toe systolic pressure readings are included as required and additional duplex testing as needed. Limited examinations for reoccurring indications may be performed as noted.  Right Rt Pressure (mmHg) Index Waveform Comment Brachial 134  Left Lt Pressure (mmHg) Index Waveform Comment  Brachial 132 PTA 19 0.14 dampened monophasic  PERO 25 0.19 dampened monophasic  DP dampened monophasic not audible  ABI/TBI Today's ABI Today's TBI Previous ABI Previous TBI Right AKA AKA Left              0.19             absent        0.30  0 Final  Radiographs: None Assessment:   1. Onychomycosis   2. Status post above-knee amputation of right lower extremity ([place])   3. Type II diabetes  mellitus with peripheral circulatory disorder (HCC)    Plan:  -Wound left hallux remains healed. He presents with no new wounds/developments today. No ischemia, no gangrene.  He has follow up with Vascular Team  on 08/07/2021. He is aware of his options as discussed with Vascular. He is to notify Vascular with any changes in his foot/leg. If changes occur after hours, he is to report to ED with gangrenous/ischemic changes in toes/foot. He related understanding. -Patient instructed to keep left foot clean, dry well and moisturize daily. -Patient to continue soft, supportive shoe gear daily. -Mycotic toenails were debrided in length and girth L 3rd toe and L 4th toe with sterile nail nippers and dremel without iatrogenic bleeding.  -Patient/POA to call should there be question/concern in the interim.  Return in about 9 weeks (around 09/18/2021).  Marzetta Board, DPM

## 2021-07-19 DIAGNOSIS — S78111S Complete traumatic amputation at level between right hip and knee, sequela: Secondary | ICD-10-CM | POA: Diagnosis not present

## 2021-07-19 DIAGNOSIS — I739 Peripheral vascular disease, unspecified: Secondary | ICD-10-CM | POA: Diagnosis not present

## 2021-07-19 DIAGNOSIS — I69351 Hemiplegia and hemiparesis following cerebral infarction affecting right dominant side: Secondary | ICD-10-CM | POA: Diagnosis not present

## 2021-07-19 DIAGNOSIS — I69898 Other sequelae of other cerebrovascular disease: Secondary | ICD-10-CM | POA: Diagnosis not present

## 2021-07-19 DIAGNOSIS — E1140 Type 2 diabetes mellitus with diabetic neuropathy, unspecified: Secondary | ICD-10-CM | POA: Diagnosis not present

## 2021-07-19 DIAGNOSIS — G609 Hereditary and idiopathic neuropathy, unspecified: Secondary | ICD-10-CM | POA: Diagnosis not present

## 2021-08-07 ENCOUNTER — Ambulatory Visit: Payer: Medicare HMO | Admitting: Physician Assistant

## 2021-08-07 ENCOUNTER — Ambulatory Visit (HOSPITAL_COMMUNITY)
Admission: RE | Admit: 2021-08-07 | Discharge: 2021-08-07 | Disposition: A | Discharge status: Home / Self Care | Payer: Medicare HMO | Source: Ambulatory Visit | Attending: Surgery | Admitting: Surgery

## 2021-08-07 ENCOUNTER — Other Ambulatory Visit: Payer: Self-pay

## 2021-08-07 VITALS — BP 134/62 | HR 75 | Temp 98.2°F | Resp 20 | Ht 67.0 in | Wt 170.6 lb

## 2021-08-07 DIAGNOSIS — I70213 Atherosclerosis of native arteries of extremities with intermittent claudication, bilateral legs: Secondary | ICD-10-CM | POA: Diagnosis not present

## 2021-08-07 DIAGNOSIS — I70248 Atherosclerosis of native arteries of left leg with ulceration of other part of lower left leg: Secondary | ICD-10-CM

## 2021-08-07 NOTE — Progress Notes (Signed)
[facility] [place] HISTORY AND PHYSICAL   History of Present Illness:  Patient is a 80 y.o. year old male who presents for evaluation of PAD with history of left GT tissue loss.  He has a history of left femoral to above-the-knee popliteal bypass in [place] which she states only lasted a few months.  He believes his greater saphenous vein was harvested from both legs for CABG.   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   S/P angiogram with occluded SFA, abrupt termination of some of the main profunda branches with developed collaterals to the AK popliteal artery.  The dominant runoff is the posterior tibial.  He was able to heal the GT.    Past Medical History:  Diagnosis Date   CAD (coronary artery disease)    a. S/p 3V CABG 2000 with LIMA-LAD, left radial-PDA, SVG-OM1. b. 2014: stent to SVG-OM1; then found to have obstruction distal to LAD anastamosis of LIMA s/p DES.    CHF (congestive heart failure) ([place])    Diverticular disease    DM (diabetes mellitus) ([place])    Hemorrhoid    HTN (hypertension)    Hyperlipidemia    Peripheral vascular disease ([place])    a. R femoropopliteal bypass ~2012 in [place], reportedly shown to be occluded. Now followed by Dr. [name].   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[place])    RBBB     Past Surgical History:  Procedure Laterality Date   ABDOMINAL ANGIOGRAM N/A 01/18/2015   Procedure: ABDOMINAL ANGIOGRAM;  Surgeon: Serafina Mitchell, MD;  Location: [facility] CATH LAB;  Service: Cardiovascular;  Laterality: N/A;   ABDOMINAL AORTAGRAM N/A 09/21/2014   Procedure: ABDOMINAL Maxcine Ham;  Surgeon: Elam Dutch, MD;  Location: [facility] CATH LAB;  Service: Cardiovascular;  Laterality: N/A;   ABDOMINAL AORTOGRAM W/LOWER EXTREMITY N/A 03/28/2021   Procedure: ABDOMINAL AORTOGRAM W/LOWER EXTREMITY;  Surgeon: Serafina Mitchell, MD;  Location: [place] CV LAB;  Service: Cardiovascular;  Laterality: N/A;   CARDIAC CATHETERIZATION  May 2014   CORONARY ARTERY BYPASS GRAFT   2000   LM occluded, LIMA to LAD patent but distal native LAD disease, saphenous vein bypass graft to OM with 95% stenosis in the midportion, right coronary artery occluded, radial artery graft to right coronary artery patent but was diffusely small. Marginal graft stented. 7 2014. Of note he had multiple previous stents in the right coronary artery. The distal LAD was treated with angioplasty. I   LOOP RECORDER IMPLANT N/A 07/12/2014   Procedure: LOOP RECORDER IMPLANT;  Surgeon: Evans Lance, MD;  Location: [facility] CATH LAB;  Service: Cardiovascular;  Laterality: N/A;   LOWER EXTREMITY ANGIOGRAM  09/21/2014   Procedure: LOWER EXTREMITY ANGIOGRAM;  Surgeon: Elam Dutch, MD;  Location: [facility] CATH LAB;  Service: Cardiovascular;;   PERCUTANEOUS CORONARY STENT INTERVENTION (PCI-S)  April 27 2013   right leg amputation  2017   TEE WITHOUT CARDIOVERSION N/A 07/12/2014   Procedure: TRANSESOPHAGEAL ECHOCARDIOGRAM (TEE);  Surgeon: Fay Records, MD;  Location: [facility] ENDOSCOPY;  Service: Cardiovascular;  Laterality: N/A;   VEIN SURGERY      ROS:   General:  No weight loss, Fever, chills  HEENT: No recent headaches, no nasal bleeding, no visual changes, no sore throat  Neurologic: No dizziness, blackouts, seizures. No recent symptoms of stroke or mini- stroke. No recent episodes of slurred speech, or temporary blindness.  Cardiac: No recent episodes of chest pain/pressure, no shortness of breath at rest.  No shortness of breath with  exertion.  Denies history of atrial fibrillation or irregular heartbeat  Vascular: No history of rest pain in feet.  No history of claudication.  positive history of non-healing ulcer, No history of DVT   Pulmonary: No home oxygen, no productive cough, no hemoptysis,  No asthma or wheezing  Musculoskeletal:  [ ]  Arthritis, [ ]  Low back pain,  [ ]  Joint pain  Hematologic:No history of hypercoagulable state.  No history of easy bleeding.  No history of anemia  Gastrointestinal:  No hematochezia or melena,  No gastroesophageal reflux, no trouble swallowing  Urinary: [ ]  chronic Kidney disease, [ ]  on HD - [ ]  MWF or [ ]  TTHS, [ ]  Burning with urination, [ ]  Frequent urination, [ ]  Difficulty urinating;   Skin: No rashes  Psychological: No history of anxiety,  No history of depression  Social History Social History   Tobacco Use   Smoking status: Never   Smokeless tobacco: Never  Substance Use Topics   Alcohol use: Yes    Alcohol/week: 0.0 standard drinks    Comment: sometimes    Drug use: No    Family History Family History  Problem Relation Age of Onset   Asthma Father    Heart disease Father    Hyperlipidemia Maternal Grandmother    CAD Other        Multiple siblings   Cancer Other        niece had lung cancer   Cancer Paternal Uncle        brain cancer     Allergies  Allergies  Allergen Reactions   Other     Other reaction(s): Unknown     Current Outpatient Medications  Medication Sig Dispense Refill   amoxicillin (AMOXIL) 500 MG capsule Take 500 mg by mouth 3 (three) times daily.     aspirin 81 MG chewable tablet Chew 81 mg by mouth daily.     atorvastatin (LIPITOR) 40 MG tablet Take 40 mg by mouth at bedtime.     carvedilol (COREG) 3.125 MG tablet Take 3.125 mg by mouth 2 (two) times daily with a meal.     cilostazol (PLETAL) 100 MG tablet Take 100 mg by mouth 2 (two) times daily.     COVID-19 mRNA vaccine, Moderna, 100 MCG/0.5ML injection USE AS DIRECTED .25 mL 0   gabapentin (NEURONTIN) 300 MG capsule Take 300 mg by mouth at bedtime.     hydroxyurea (HYDREA) 500 MG capsule Take 2 capsules in morning and 1 capsule in evening 90 capsule 3   insulin glargine (LANTUS) 100 UNIT/ML injection Inject 10 Units into the skin at bedtime.     insulin lispro (HUMALOG) 100 UNIT/ML injection Inject 4 Units into the skin daily as needed for high blood sugar.     isosorbide mononitrate (IMDUR) 60 MG 24 hr tablet Take 1-2 tablets (60-120 mg total)  by mouth 2 (two) times daily. 2 in the morning and 1 in the evening 270 tablet 3   Lancets (ONETOUCH DELICA PLUS [id number]) MISC Apply topically 2 (two) times daily.     levocetirizine (XYZAL) 5 MG tablet Take 5 mg by mouth every evening.     losartan (COZAAR) 25 MG tablet Take 25 mg by mouth at bedtime.     nitroGLYCERIN (NITROSTAT) 0.4 MG SL tablet Place 1 tablet (0.4 mg total) under the tongue every 5 (five) minutes as needed for chest pain. 25 tablet 12   ONETOUCH VERIO test strip 1 each 2 (two) times daily.  PROAIR HFA 108 (90 BASE) MCG/ACT inhaler Inhale 1 puff into the lungs every 4 (four) hours as needed for wheezing or shortness of breath.      No current facility-administered medications for this visit.    Physical Examination  Vitals:   08/07/21 1044  BP: 134/62  Pulse: 75  Resp: 20  Temp: 98.2 F (36.8 C)  TempSrc: Temporal  SpO2: 98%  Weight: 170 lb 9.6 oz (77.4 kg)  Height: 5\' 7"  (1.702 m)    Body mass index is 26.72 kg/m.  General:  Alert and oriented, no acute distress HEENT: Normal Neck: No bruit or JVD Pulmonary: Clear to auscultation bilaterally Cardiac: Regular Rate and Rhythm without murmur Abdomen: Soft, non-tender, non-distended, no mass, no scars Skin: No rash, healed left GT ulcer.  Skin is darkened but warm on the left foot Musculoskeletal:minimal edema left LE  Neurologic: Upper and lower extremity motor grossly intact and symmetric  DATA:  ABI Findings:  +--------+------------------+-----+--------+--------+  Right   Rt Pressure (mmHg)IndexWaveformComment   +--------+------------------+-----+--------+--------+  [id number]                                      +--------+------------------+-----+--------+--------+   +---------+------------------+-----+----------+-------+  Left     Lt Pressure (mmHg)IndexWaveform  Comment  +---------+------------------+-----+----------+-------+  Brachial 143                                        +---------+------------------+-----+----------+-------+  PTA      66                0.46 monophasic         +---------+------------------+-----+----------+-------+  DP       53                0.37 monophasic         +---------+------------------+-----+----------+-------+  Great Toe0                 0.00                    +---------+------------------+-----+----------+-------+   +-------+-----------+-----------+------------+------------+  ABI/TBIToday's ABIToday's TBIPrevious ABIPrevious TBI  +-------+-----------+-----------+------------+------------+  Right  AKA                   AKA                       +-------+-----------+-----------+------------+------------+  Left   0.46       0          0.19        0             +-------+-----------+-----------+------------+------------+   ASSESSMENT:  PAD, CAD, DM S/P Angiogram without intervention. Left femoral to above-the-knee popliteal bypass in [place] which she states only lasted a few months.  Right AKA.  Left ABIs appear increased compared to prior study on 03/20/2021.   He has no new ulcer, no rest pain or symptoms of claudication.  PLAN: He will remain as active as he can, elevation when at rest, compression if he tolerates it.  If he develops new symptoms of non healing wound, rest pain or claudication that increase with short distances he will call.  His next option would be revision of the bypass.  He will f/u for surveillance in 6 months  with repeat ABI.     Roxy Horseman PA-C [facility] [place] Office: [phone]  MD in clinic [place]

## 2021-08-09 ENCOUNTER — Other Ambulatory Visit: Payer: Self-pay

## 2021-08-09 DIAGNOSIS — I739 Peripheral vascular disease, unspecified: Secondary | ICD-10-CM

## 2021-08-24 ENCOUNTER — Telehealth: Payer: Self-pay

## 2021-08-24 NOTE — Telephone Encounter (Signed)
Pt called to get his appt changed that is scheduled on 08/28/2021.  Returned pt's called and instructed pt to contact scheduling at [phone] and request to speak with scheduling. Pt verbalized understanding of instructions.

## 2021-08-28 ENCOUNTER — Other Ambulatory Visit: Payer: Self-pay

## 2021-08-28 ENCOUNTER — Inpatient Hospital Stay: Payer: Medicare HMO | Attending: Nurse Practitioner

## 2021-08-28 DIAGNOSIS — D7581 Myelofibrosis: Secondary | ICD-10-CM | POA: Diagnosis not present

## 2021-08-28 LAB — CMP (CANCER CENTER ONLY)
ALT: 12 U/L (ref 0–44)
AST: 14 U/L — ABNORMAL LOW (ref 15–41)
Albumin: 3.9 g/dL (ref 3.5–5.0)
Alkaline Phosphatase: 80 U/L (ref 38–126)
Anion gap: 7 (ref 5–15)
BUN: 17 mg/dL (ref 8–23)
CO2: 25 mmol/L (ref 22–32)
Calcium: 8.6 mg/dL — ABNORMAL LOW (ref 8.9–10.3)
Chloride: 108 mmol/L (ref 98–111)
Creatinine: 0.98 mg/dL (ref 0.61–1.24)
GFR, Estimated: >60 mL/min (ref >60)
Glucose, Bld: 90 mg/dL (ref 70–99)
Potassium: 4 mmol/L (ref 3.5–5.1)
Sodium: 140 mmol/L (ref 135–145)
Total Bilirubin: 0.6 mg/dL (ref 0.3–1.2)
Total Protein: 6.3 g/dL — ABNORMAL LOW (ref 6.5–8.1)

## 2021-08-28 LAB — CBC WITH DIFFERENTIAL (CANCER CENTER ONLY)
Abs Immature Granulocytes: 0.14 10*3/uL — ABNORMAL HIGH (ref 0.00–0.07)
Basophils Absolute: 0.2 10*3/uL — ABNORMAL HIGH (ref 0.0–0.1)
Basophils Relative: 1 %
Eosinophils Absolute: 0.2 10*3/uL (ref 0.0–0.5)
Eosinophils Relative: 1 %
HCT: 36 % — ABNORMAL LOW (ref 39.0–52.0)
Hemoglobin: 11.6 g/dL — ABNORMAL LOW (ref 13.0–17.0)
Immature Granulocytes: 1 %
Lymphocytes Relative: 6 %
Lymphs Abs: 0.9 10*3/uL (ref 0.7–4.0)
MCH: 35.6 pg — ABNORMAL HIGH (ref 26.0–34.0)
MCHC: 32.2 g/dL (ref 30.0–36.0)
MCV: 110.4 fL — ABNORMAL HIGH (ref 80.0–100.0)
Monocytes Absolute: 0.6 10*3/uL (ref 0.1–1.0)
Monocytes Relative: 4 %
Neutro Abs: 14.3 10*3/uL — ABNORMAL HIGH (ref 1.7–7.7)
Neutrophils Relative %: 87 %
Platelet Count: 570 10*3/uL — ABNORMAL HIGH (ref 150–400)
RBC: 3.26 MIL/uL — ABNORMAL LOW (ref 4.22–5.81)
RDW: 17.6 % — ABNORMAL HIGH (ref 11.5–15.5)
WBC Count: 16.3 10*3/uL — ABNORMAL HIGH (ref 4.0–10.5)
nRBC: 0.2 % (ref 0.0–0.2)

## 2021-08-29 DIAGNOSIS — E119 Type 2 diabetes mellitus without complications: Secondary | ICD-10-CM | POA: Diagnosis not present

## 2021-08-29 DIAGNOSIS — H5200 Hypermetropia, unspecified eye: Secondary | ICD-10-CM | POA: Diagnosis not present

## 2021-08-30 ENCOUNTER — Telehealth: Payer: Self-pay

## 2021-08-30 NOTE — Telephone Encounter (Signed)
-----   Message from Truitt Merle, MD sent at 08/30/2021 11:30 AM EST ----- Please let pt know his plt is elevated, make sure he is taking [place] correctly. Will repeat lab in 2 months as scheduled, to see if we need increase his does. OK to continue current does per my list note. Thanks   Truitt Merle

## 2021-08-30 NOTE — Telephone Encounter (Signed)
This nurse spoke with patient and made aware of lab results and MD recommendations. Patient acknowledged understanding.  Also verified that he is taking his Hydrea as ordered, Tabs in the morning and 1 tab in the evening. No further questions or concerns at this time, patient knows to call clinic with any issues.

## 2021-09-08 ENCOUNTER — Other Ambulatory Visit: Payer: Self-pay

## 2021-09-08 ENCOUNTER — Encounter: Payer: Self-pay | Admitting: Podiatry

## 2021-09-08 ENCOUNTER — Ambulatory Visit: Payer: Medicare HMO | Admitting: Podiatry

## 2021-09-08 DIAGNOSIS — I70222 Atherosclerosis of native arteries of extremities with rest pain, left leg: Secondary | ICD-10-CM | POA: Diagnosis not present

## 2021-09-08 DIAGNOSIS — E1142 Type 2 diabetes mellitus with diabetic polyneuropathy: Secondary | ICD-10-CM

## 2021-09-08 DIAGNOSIS — E119 Type 2 diabetes mellitus without complications: Secondary | ICD-10-CM | POA: Diagnosis not present

## 2021-09-08 DIAGNOSIS — Z89611 Acquired absence of right leg above knee: Secondary | ICD-10-CM | POA: Diagnosis not present

## 2021-09-08 DIAGNOSIS — E1151 Type 2 diabetes mellitus with diabetic peripheral angiopathy without gangrene: Secondary | ICD-10-CM

## 2021-09-08 NOTE — Progress Notes (Signed)
ANNUAL DIABETIC FOOT EXAM  Subjective: Tyler Whitehead presents today for for annual diabetic foot examination and high risk foot care. Patient has h/o NIDDM with PAD and is s/p amputation of above knee amputation right lower extremity.  He presents today c/o painful left hallux. Also has intermittent pain in the lower left leg which comes and goes. Denies any wound, no redness, no drainage, no gangrene. He has created a dressing to protect the digit and states this helps to keep anything from touching the toe. Denies any fever, chills, night sweats, nausea or vomiting  He was seen by Vascular on 08/07/2021.  Patient relates longstanding h/o diabetes.  Patient has been diagnosed with neuropathy and it is managed with gabapentin.  Patient's blood sugar was 119 mg/dl today.   Wenda Low, MD is patient's PCP. Last visit was 05/12/2021.                 Past Medical History:  Diagnosis Date   CAD (coronary artery disease)    a. S/p 3V CABG 2000 with LIMA-LAD, left radial-PDA, SVG-OM1. b. 2014: stent to SVG-OM1; then found to have obstruction distal to LAD anastamosis of LIMA s/p DES.    CHF (congestive heart failure) ([place])    Diverticular disease    DM (diabetes mellitus) ([place])    Hemorrhoid    HTN (hypertension)    Hyperlipidemia    Peripheral vascular disease ([place])    a. R femoropopliteal bypass ~2012 in [place], reportedly shown to be occluded. Now followed by Dr. [name].   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[place])    RBBB    Patient Active Problem List   Diagnosis Date Noted   Status post above-knee amputation of right lower extremity ([place]) 09/08/2021   Atherosclerosis of native artery of left lower extremity with rest pain ([place]) 09/08/2021   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 ([place]) 11/14/2020   Allergic rhinitis 11/14/2020   Chronic constipation 11/14/2020   Depression 11/14/2020   Diabetic peripheral neuropathy associated with type 2 diabetes mellitus ([place])  11/14/2020   Dysphagia 11/14/2020   Hyperlipidemia 11/14/2020   Leukocytosis 11/14/2020   Nausea and vomiting 11/14/2020   Presence of aortocoronary bypass graft 11/14/2020   Pure hypercholesterolemia 11/14/2020   Vitamin D deficiency 11/14/2020   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   History of stroke 07/06/2015   Transient vision disturbance, right 07/06/2015   Atherosclerosis of native arteries of extremity with intermittent claudication ([place]) 09/27/2014   Localization-related symptomatic epilepsy and epileptic syndromes with complex partial seizures, not intractable, without status epilepticus ([place]) 08/04/2014   Occipital stroke ([place]) 08/04/2014   Cerebral thrombosis with cerebral infarction ([place]) 07/10/2014   New onset seizure ([place]) 07/09/2014   Diabetic hyperosmolar non-ketotic state ([place]) 07/09/2014   Uncontrolled hypertension 07/09/2014   Polycythemia vera ([place]) 07/09/2014   Seizure ([place]) 07/09/2014   Visual field defect 07/09/2014   Headache 06/30/2014   HTN (hypertension) 11/23/2013   Dyslipidemia 11/23/2013   Myelofibrosis ([place]) 11/22/2013   Diverticular disease 11/22/2013   Hemorrhoids 11/22/2013   S/P CABG x 3 11/22/2013   CAD (coronary artery disease) 11/22/2013   Peripheral vascular disease ([place]) 11/22/2013   DM2 (diabetes mellitus, type 2) ([place]) 11/22/2013   Renal cyst, left 11/22/2013   Splenomegaly 11/22/2013   Past Surgical History:  Procedure Laterality Date   ABDOMINAL ANGIOGRAM N/A 01/18/2015   Procedure: ABDOMINAL ANGIOGRAM;  Surgeon: Serafina Mitchell, MD;  Location: [facility] CATH LAB;  Service: Cardiovascular;  Laterality: N/A;   ABDOMINAL AORTAGRAM N/A  09/21/2014   Procedure: ABDOMINAL Maxcine Ham;  Surgeon: Elam Dutch, MD;  Location: [facility] CATH LAB;  Service: Cardiovascular;  Laterality: N/A;   ABDOMINAL AORTOGRAM W/LOWER EXTREMITY N/A 03/28/2021   Procedure: ABDOMINAL AORTOGRAM W/LOWER EXTREMITY;  Surgeon: Serafina Mitchell, MD;  Location: [place] CV  LAB;  Service: Cardiovascular;  Laterality: N/A;   CARDIAC CATHETERIZATION  May 2014   CORONARY ARTERY BYPASS GRAFT  2000   LM occluded, LIMA to LAD patent but distal native LAD disease, saphenous vein bypass graft to OM with 95% stenosis in the midportion, right coronary artery occluded, radial artery graft to right coronary artery patent but was diffusely small. Marginal graft stented. 7 2014. Of note he had multiple previous stents in the right coronary artery. The distal LAD was treated with angioplasty. I   LOOP RECORDER IMPLANT N/A 07/12/2014   Procedure: LOOP RECORDER IMPLANT;  Surgeon: Evans Lance, MD;  Location: [facility] CATH LAB;  Service: Cardiovascular;  Laterality: N/A;   LOWER EXTREMITY ANGIOGRAM  09/21/2014   Procedure: LOWER EXTREMITY ANGIOGRAM;  Surgeon: Elam Dutch, MD;  Location: [facility] CATH LAB;  Service: Cardiovascular;;   PERCUTANEOUS CORONARY STENT INTERVENTION (PCI-S)  April 27 2013   right leg amputation  2017   TEE WITHOUT CARDIOVERSION N/A 07/12/2014   Procedure: TRANSESOPHAGEAL ECHOCARDIOGRAM (TEE);  Surgeon: Fay Records, MD;  Location: [facility] ENDOSCOPY;  Service: Cardiovascular;  Laterality: N/A;   VEIN SURGERY     Current Outpatient Medications on File Prior to Visit  Medication Sig Dispense Refill   amoxicillin (AMOXIL) 500 MG capsule Take 500 mg by mouth 3 (three) times daily.     aspirin 81 MG chewable tablet Chew 81 mg by mouth daily.     atorvastatin (LIPITOR) 40 MG tablet Take 40 mg by mouth at bedtime.     carvedilol (COREG) 3.125 MG tablet Take 3.125 mg by mouth 2 (two) times daily with a meal.     cilostazol (PLETAL) 100 MG tablet Take 100 mg by mouth 2 (two) times daily.     COVID-19 mRNA vaccine, Moderna, 100 MCG/0.5ML injection USE AS DIRECTED .25 mL 0   gabapentin (NEURONTIN) 300 MG capsule Take 300 mg by mouth at bedtime.     hydroxyurea (HYDREA) 500 MG capsule Take 2 capsules in morning and 1 capsule in evening 90 capsule 3   insulin glargine (LANTUS) 100  UNIT/ML injection Inject 10 Units into the skin at bedtime.     insulin lispro (HUMALOG) 100 UNIT/ML injection Inject 4 Units into the skin daily as needed for high blood sugar.     isosorbide mononitrate (IMDUR) 60 MG 24 hr tablet Take 1-2 tablets (60-120 mg total) by mouth 2 (two) times daily. 2 in the morning and 1 in the evening 270 tablet 3   Lancets (ONETOUCH DELICA PLUS [id number]) MISC Apply topically 2 (two) times daily.     levocetirizine (XYZAL) 5 MG tablet Take 5 mg by mouth every evening.     losartan (COZAAR) 25 MG tablet Take 25 mg by mouth at bedtime.     nitroGLYCERIN (NITROSTAT) 0.4 MG SL tablet Place 1 tablet (0.4 mg total) under the tongue every 5 (five) minutes as needed for chest pain. 25 tablet 12   ONETOUCH VERIO test strip 1 each 2 (two) times daily.     PROAIR HFA 108 (90 BASE) MCG/ACT inhaler Inhale 1 puff into the lungs every 4 (four) hours as needed for wheezing or shortness of breath.  No current facility-administered medications on file prior to visit.    Allergies  Allergen Reactions   Other     Other reaction(s): Unknown   Social History   Occupational History   Occupation: Limo driver  Tobacco Use   Smoking status: Never   Smokeless tobacco: Never  Substance and Sexual Activity   Alcohol use: Yes    Alcohol/week: 0.0 standard drinks    Comment: sometimes    Drug use: No   Sexual activity: Not on file   Family History  Problem Relation Age of Onset   Asthma Father    Heart disease Father    Hyperlipidemia Maternal Grandmother    CAD Other        Multiple siblings   Cancer Other        niece had lung cancer   Cancer Paternal Uncle        brain cancer    Immunization History  Administered Date(s) Administered   Hepatitis B, adult 12/07/2014   Influenza, High Dose Seasonal PF 08/29/2020   Influenza-Unspecified 07/08/2014   Moderna SARS-COV2 Booster Vaccination 11/09/2020   Moderna Sars-Covid-2 Vaccination 02/05/2020, 02/10/2020,  08/08/2020   Pneumococcal Conjugate-13 12/07/2014   Pneumococcal Polysaccharide-23 01/03/2021   Zoster, Live 12/22/2013     Review of Systems: Negative except as noted in the HPI.   Objective: There were no vitals filed for this visit.  Tyler Whitehead is a pleasant 80 y.o. male in NAD. AAO X 3.  Vascular Examination: Capillary fill time to digits delayed left lower extremity. Nonpalpable pedal pulses LLE. Pedal hair absent. No pain with calf compression LLE. Trace edema noted left lower extremity. No cyanosis or clubbing noted b/l LE.  Dermatological Examination: No open wounds left lower extremity. No interdigital macerations left lower extremity. Toenails 1-5 left well maintained with adequate length. No erythema, no edema, no drainage, no fluctuance. No hyperkeratotic nor porokeratotic lesions present on today's visit.  Musculoskeletal Examination: Muscle strength 5/5 to all LE muscle groups of left lower extremity. Lower extremity amputation(s): above knee amputation right lower extremity. Utilizes walker for ambulation assistance.  Footwear Assessment: Does the patient wear appropriate shoes? Yes. Does the patient need inserts/orthotics? Yes.  Neurological Examination: Protective sensation diminished with 10 gram monofilament left lower extremity. Vibratory sensation diminished left lower extremity.  VAS [place] ABI WITH/WO TBI  Result Date: 08/07/2021  LOWER EXTREMITY DOPPLER STUDY Patient Name:  Tyler Whitehead  Date of Exam:   08/07/2021 Medical Rec #: [id number]       Accession #:    [id number] Date of Birth: [birth year]       Patient Gender: M Patient Age:   34 years Exam Location:  [facility] Procedure:        VAS [place] ABI WITH/WO TBI Referring Phys: Harold Barban --------------------------------------------------------------------------------    Indications: Claudication, rest pain, and peripheral artery disease. High Risk Factors: Hypertension, hyperlipidemia,  Diabetes, no history of smoking, coronary artery disease. Other Factors: Prior left leg bypass graft and right leg above knee amputation.  Performing Technologist: Delorise Shiner RVT  Examination Guidelines: A complete evaluation includes at minimum, Doppler waveform signals and systolic blood pressure reading at the level of bilateral brachial, anterior tibial, and posterior tibial arteries, when vessel segments are accessible. Bilateral testing is considered an integral part of a complete examination. Photoelectric Plethysmograph (PPG) waveforms and toe systolic pressure readings are included as required and additional duplex testing as needed. Limited examinations for reoccurring indications may be performed  as noted.    ABI Findings: +--------+------------------+-----+--------+--------+   Right   Rt Pressure (mmHg)IndexWaveformComment  +--------+------------------+-----+--------+--------+   [id number]   +--------+------------------+-----+--------+--------+ +---------+------------------+-----+----------+-------+  Left      Lt Pressure (mmHg)IndexWaveform  Comment +---------+------------------+-----+----------+-------+   Brachial 143                                      +---------+------------------+-----+----------+-------+   PTA      66                0.46 monophasic        +---------+------------------+-----+----------+-------+   DP       53                0.37 monophasic        +---------+------------------+-----+----------+-------+   Great Toe0               0.00       ABI/TBIToday's ABIToday's TBIPrevious ABIPrevious TBI  Right  AKA                   AKA                 Left   0.46       0          0.19        0           Left ABIs appear increased compared to prior study on 03/20/2021.  Summary: Right: AKA. Left: Resting left ankle-brachial index indicates severe left lower extremity arterial disease. The left toe-brachial index is  abnormal.  *See table(s) above for measurements and observations.    Electronically signed by Harold Barban MD on 08/07/2021 at 5:12:19 PM.      Final      Assessment: 1. Status post above-knee amputation of right lower extremity ([place])   2. Atherosclerosis of native artery of left lower extremity with rest pain ([place])   3. Type II diabetes mellitus with peripheral circulatory disorder (HCC)   4. Diabetic peripheral neuropathy associated with type 2 diabetes mellitus ([place])   5. Encounter for diabetic foot exam ([place])     ADA Risk Categorization: High Risk  Patient has one or more of the following: Loss of protective sensation Absent pedal pulses Severe Foot deformity History of foot ulcer  Plan: -No open wounds. He is protecting left hallux with dressing. No debridement performed today. Counseled patient and advised him pain is vascular in nature. Encouraged him to consider bypass as discussed with Vascular Team. He related understanding and stated he would call his Vascular provider. -Diabetic foot examination performed today. -Continue foot and shoe inspections daily. Monitor blood glucose per PCP/Endocrinologist's recommendations. -Patient/POA to call should there be question/concern in the interim.  Return in about 9 weeks (around 11/10/2021).  Marzetta Board, DPM

## 2021-09-12 ENCOUNTER — Ambulatory Visit: Payer: Medicare HMO | Admitting: Internal Medicine

## 2021-09-18 ENCOUNTER — Ambulatory Visit: Payer: Medicare HMO | Admitting: Podiatry

## 2021-10-05 DIAGNOSIS — R69 Illness, unspecified: Secondary | ICD-10-CM | POA: Diagnosis not present

## 2021-10-05 DIAGNOSIS — I2510 Atherosclerotic heart disease of native coronary artery without angina pectoris: Secondary | ICD-10-CM | POA: Diagnosis not present

## 2021-10-05 DIAGNOSIS — E1140 Type 2 diabetes mellitus with diabetic neuropathy, unspecified: Secondary | ICD-10-CM | POA: Diagnosis not present

## 2021-10-05 DIAGNOSIS — E785 Hyperlipidemia, unspecified: Secondary | ICD-10-CM | POA: Diagnosis not present

## 2021-10-05 DIAGNOSIS — I10 Essential (primary) hypertension: Secondary | ICD-10-CM | POA: Diagnosis not present

## 2021-10-10 DIAGNOSIS — E785 Hyperlipidemia, unspecified: Secondary | ICD-10-CM | POA: Diagnosis not present

## 2021-10-10 DIAGNOSIS — Z79899 Other long term (current) drug therapy: Secondary | ICD-10-CM | POA: Diagnosis not present

## 2021-10-10 DIAGNOSIS — Z125 Encounter for screening for malignant neoplasm of prostate: Secondary | ICD-10-CM | POA: Diagnosis not present

## 2021-10-10 DIAGNOSIS — Z8673 Personal history of transient ischemic attack (TIA), and cerebral infarction without residual deficits: Secondary | ICD-10-CM | POA: Diagnosis not present

## 2021-10-10 DIAGNOSIS — Z0000 Encounter for general adult medical examination without abnormal findings: Secondary | ICD-10-CM | POA: Diagnosis not present

## 2021-10-10 DIAGNOSIS — G8190 Hemiplegia, unspecified affecting unspecified side: Secondary | ICD-10-CM | POA: Diagnosis not present

## 2021-10-10 DIAGNOSIS — Z89619 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: Secondary | ICD-10-CM | POA: Diagnosis not present

## 2021-10-10 DIAGNOSIS — Z23 Encounter for immunization: Secondary | ICD-10-CM | POA: Diagnosis not present

## 2021-10-10 DIAGNOSIS — I739 Peripheral vascular disease, unspecified: Secondary | ICD-10-CM | POA: Diagnosis not present

## 2021-10-10 DIAGNOSIS — I10 Essential (primary) hypertension: Secondary | ICD-10-CM | POA: Diagnosis not present

## 2021-10-10 DIAGNOSIS — D471 Chronic myeloproliferative disease: Secondary | ICD-10-CM | POA: Diagnosis not present

## 2021-10-10 DIAGNOSIS — R69 Illness, unspecified: Secondary | ICD-10-CM | POA: Diagnosis not present

## 2021-10-10 DIAGNOSIS — E1140 Type 2 diabetes mellitus with diabetic neuropathy, unspecified: Secondary | ICD-10-CM | POA: Diagnosis not present

## 2021-10-16 ENCOUNTER — Encounter: Payer: Self-pay | Admitting: Hematology

## 2021-10-20 ENCOUNTER — Telehealth: Payer: Self-pay

## 2021-10-20 NOTE — Telephone Encounter (Signed)
Pt was left a voicemail about changes to appointments and was told to call back with any questions.

## 2021-10-23 ENCOUNTER — Other Ambulatory Visit: Payer: No Typology Code available for payment source

## 2021-10-23 ENCOUNTER — Ambulatory Visit: Payer: Medicare HMO | Admitting: Hematology

## 2021-10-26 DIAGNOSIS — Z20822 Contact with and (suspected) exposure to covid-19: Secondary | ICD-10-CM | POA: Diagnosis not present

## 2021-10-26 DIAGNOSIS — J00 Acute nasopharyngitis [common cold]: Secondary | ICD-10-CM | POA: Diagnosis not present

## 2021-10-26 DIAGNOSIS — R69 Illness, unspecified: Secondary | ICD-10-CM | POA: Diagnosis not present

## 2021-10-26 DIAGNOSIS — Z03818 Encounter for observation for suspected exposure to other biological agents ruled out: Secondary | ICD-10-CM | POA: Diagnosis not present

## 2021-10-30 DIAGNOSIS — R69 Illness, unspecified: Secondary | ICD-10-CM | POA: Diagnosis not present

## 2021-11-01 ENCOUNTER — Encounter: Payer: Self-pay | Admitting: Hematology

## 2021-11-02 ENCOUNTER — Other Ambulatory Visit: Payer: Self-pay

## 2021-11-02 ENCOUNTER — Inpatient Hospital Stay: Payer: No Typology Code available for payment source | Attending: Nurse Practitioner | Admitting: Hematology

## 2021-11-02 ENCOUNTER — Inpatient Hospital Stay: Payer: No Typology Code available for payment source

## 2021-11-02 VITALS — BP 156/62 | HR 89 | Temp 98.0°F | Resp 18 | Ht 67.0 in | Wt 170.8 lb

## 2021-11-02 DIAGNOSIS — C946 Myelodysplastic disease, not classified: Secondary | ICD-10-CM | POA: Diagnosis not present

## 2021-11-02 DIAGNOSIS — D471 Chronic myeloproliferative disease: Secondary | ICD-10-CM | POA: Insufficient documentation

## 2021-11-02 DIAGNOSIS — E1151 Type 2 diabetes mellitus with diabetic peripheral angiopathy without gangrene: Secondary | ICD-10-CM | POA: Diagnosis not present

## 2021-11-02 DIAGNOSIS — D7581 Myelofibrosis: Secondary | ICD-10-CM

## 2021-11-02 DIAGNOSIS — I2510 Atherosclerotic heart disease of native coronary artery without angina pectoris: Secondary | ICD-10-CM | POA: Diagnosis not present

## 2021-11-02 DIAGNOSIS — E785 Hyperlipidemia, unspecified: Secondary | ICD-10-CM

## 2021-11-02 DIAGNOSIS — I10 Essential (primary) hypertension: Secondary | ICD-10-CM

## 2021-11-02 DIAGNOSIS — I110 Hypertensive heart disease with heart failure: Secondary | ICD-10-CM | POA: Diagnosis not present

## 2021-11-02 DIAGNOSIS — R69 Illness, unspecified: Secondary | ICD-10-CM | POA: Diagnosis not present

## 2021-11-02 LAB — CBC WITH DIFFERENTIAL (CANCER CENTER ONLY)
Abs Immature Granulocytes: 0.02 10*3/uL (ref 0.00–0.07)
Basophils Absolute: 0 10*3/uL (ref 0.0–0.1)
Basophils Relative: 2 %
Eosinophils Absolute: 0.1 10*3/uL (ref 0.0–0.5)
Eosinophils Relative: 3 %
HCT: 28.2 % — ABNORMAL LOW (ref 39.0–52.0)
Hemoglobin: 9.6 g/dL — ABNORMAL LOW (ref 13.0–17.0)
Immature Granulocytes: 1 %
Lymphocytes Relative: 17 %
Lymphs Abs: 0.3 10*3/uL — ABNORMAL LOW (ref 0.7–4.0)
MCH: 35.6 pg — ABNORMAL HIGH (ref 26.0–34.0)
MCHC: 34 g/dL (ref 30.0–36.0)
MCV: 104.4 fL — ABNORMAL HIGH (ref 80.0–100.0)
Monocytes Absolute: 0.1 10*3/uL (ref 0.1–1.0)
Monocytes Relative: 5 %
Neutro Abs: 1.3 10*3/uL — ABNORMAL LOW (ref 1.7–7.7)
Neutrophils Relative %: 72 %
Platelet Count: 301 10*3/uL (ref 150–400)
RBC: 2.7 MIL/uL — ABNORMAL LOW (ref 4.22–5.81)
RDW: 18 % — ABNORMAL HIGH (ref 11.5–15.5)
Smear Review: NORMAL
WBC Count: 1.8 10*3/uL — ABNORMAL LOW (ref 4.0–10.5)
nRBC: 0 % (ref 0.0–0.2)

## 2021-11-02 LAB — CMP (CANCER CENTER ONLY)
ALT: 6 U/L (ref 0–44)
AST: 10 U/L — ABNORMAL LOW (ref 15–41)
Albumin: 3.6 g/dL (ref 3.5–5.0)
Alkaline Phosphatase: 65 U/L (ref 38–126)
Anion gap: 4 — ABNORMAL LOW (ref 5–15)
BUN: 23 mg/dL (ref 8–23)
CO2: 30 mmol/L (ref 22–32)
Calcium: 8.6 mg/dL — ABNORMAL LOW (ref 8.9–10.3)
Chloride: 108 mmol/L (ref 98–111)
Creatinine: 1.19 mg/dL (ref 0.61–1.24)
GFR, Estimated: >60 mL/min (ref >60)
Glucose, Bld: 244 mg/dL — ABNORMAL HIGH (ref 70–99)
Potassium: 4 mmol/L (ref 3.5–5.1)
Sodium: 142 mmol/L (ref 135–145)
Total Bilirubin: 0.6 mg/dL (ref 0.3–1.2)
Total Protein: 5.9 g/dL — ABNORMAL LOW (ref 6.5–8.1)

## 2021-11-02 NOTE — Progress Notes (Signed)
Tyler Whitehead   [phone] [phone]   Clinic Follow up Note   Patient Care Team: Tyler Low, MD as PCP - General (Internal Medicine) Tyler Pickett Nadean Corwin, MD as Consulting Physician (Cardiology) Tyler Breeding, MD as Consulting Physician (Cardiology) Tyler Sprang, MD as Consulting Physician (Neurology)  Date of Service:  11/02/2021  CHIEF COMPLAINT: f/u of myelofibrosis  CURRENT THERAPY:  Hydrea $Remov'1000mg'SYzdfv$  am and $Remo'500mg'nbtTt$  pm daily  ASSESSMENT & PLAN:  Tyler Whitehead is a 81 y.o. male with   1. 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 -We discussed that this is not curable disease, but treatable. Some patient will develop acute leukemia or aplastic anemia later on. -he has intermediate risk disease, which predicts median survival 6.5 years, he has been doing better than average for sure  -he is not a good candidate for stem cell transplant due to his advanced age and multiple medical comorbidities. -I discussed that he is at high risk for thrombosis secondary to his disease, he will continue aspirin and plavix  -he tried Tyler Whitehead for 4 months in 2014, could not tolerate due to his anemia and fatigue. -He is aware of the importance of continued Hydrea and normalized his blood counts to prevent further stroke or thrombosis.  -He is currently on Hydrea to 1000 mg in the morning, 500 mg in the evening daily because of a miscommunication. -labs reviewed, WBC 1.8, hgb 9.6, plt WNL. I asked how much he is currently taking. After some thought, he realized he is taking 1000 mg BID. I advised him to drop back to $Remov'1000mg'nTCUPe$  A'500mg'$  PM, and advised him it's okay to skip a few doses due to the increased dose recently. I recommended he use a pill box or a calendar.  2. Comorbidities: DM, Dyslipidemia, HTN, CAD -he is s/p right AKA, uses a walker, able to live independently -f/u with  PCP and cardiologist   3. PVD  -h/o multiple failed bypass grafts -recent angiogram in 03/2021 showed occlusion of superficial femoral artery and popliteal artery occlusion -he is under surveillance by Tyler Whitehead   4. Stroke and seizure in 07/2014 -follow up with his neurologist      Plan: -continue Hydrea, $RemoveBeforeDEI'1000mg'fPtEwjZkykUYmtqi$  in am, and $Remov'500mg'kAENVe$  in evening daily, I refilled for him today.  -Labs in 6 weeks and 3 months  -labs and f/u in 6 months -He has copay for lab here, will call his PCP to see if they can do his lab there   No problem-specific Assessment & Plan notes Whitehead for this encounter.   SUMMARY OF ONCOLOGIC HISTORY: Oncology History  Myelofibrosis ([place])  12/26/2012 Bone Marrow Biopsy   Showed hypercellularity with reticulum fibrosis, JAK-2 mutation positive, BCR/ABL negative, 45 XY in 20 metaphase, and Intermediate-1 ,DIPSS of 2. (WBC < 25; Hbg > 10;    01/02/2013 Imaging   US abdomen complete revealed Mild splenogmealy (13.3 cm) and a 2.2 x 1.7 x 1.8 cm left renal cyst   01/15/2013 Pathology Results   Integrated hematopathology report.  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    03/17/2013 Imaging   CT Chest: Crowded peribronchial markings noted, associated with linear densities involving the left base, probably representing areas of platelike atelectasis/scarring.  No mass or infiltrate seen.    03/19/2013 - 07/20/2013 Chemotherapy   Started Jakafi 20 mg bid. Dose reduced  due to anemia.  Stopped due to increasing fatigue while on jakafi.    04/28/2013 - 05/01/2013 Hospital Admission   Admitted to [facility] by Tyler Whitehead, his cardiologist, due to the fact that he had midsternal chest pain.  He had a coronary stent placed this admission.    05/28/2013 - 05/29/2013 Hospital Admission   Admitted to [facility] and  received 3 units of blood (Dr. Lendon Whitehead note).    06/10/2013 Surgery   Cololonoscopy due to GIB.  C/w diverticular disease and hemorrhoids but no active bleeding.    10/05/2013 -  Chemotherapy   Started Hydrea 500 mg daily.   03/19/2014 -  Chemotherapy   Hydrea increased to 500 mg bid wbc 17, hb 17, hct 54, plt 437   06/29/2014 Procedure   cbc shows wbc 18.8 hb 18.7 hct 58.9 plt 456. ?compliance w hydrea as MCV not elevated. symptomatic start TP weekly x 4   07/28/2014 Procedure   TP every 2 weeks if hct > 45, changed to monthly in April 2016      INTERVAL HISTORY:  Tyler Whitehead is here for a follow up of myelofibrosis. He was last seen by me on 06/26/21. He presents to the clinic alone. He reports he had some issues with his big toe on hie left foot. He notes the nail has fallen out and is healing now. He reports he is also having toothache today. He notes it will take a while to get anything done about this.   All other systems were reviewed with the patient and are negative.  MEDICAL HISTORY:  Past Medical History:  Diagnosis Date   CAD (coronary artery disease)    a. S/p 3V CABG 2000 with LIMA-LAD, left radial-PDA, SVG-OM1. b. 2014: stent to SVG-OM1; then Whitehead to have obstruction distal to LAD anastamosis of LIMA s/p DES.    CHF (congestive heart failure) ([place])    Diverticular disease    DM (diabetes mellitus) ([place])    Hemorrhoid    HTN (hypertension)    Hyperlipidemia    Peripheral vascular disease ([place])    a. R femoropopliteal bypass ~2012 in [place], reportedly shown to be occluded. Now followed by Dr. Gwenlyn Whitehead.   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)    RBBB     SURGICAL HISTORY: Past Surgical History:  Procedure Laterality Date   ABDOMINAL ANGIOGRAM N/A 01/18/2015   Procedure: ABDOMINAL ANGIOGRAM;  Surgeon: Tyler Mitchell, MD;  Location: [facility] CATH LAB;  Service: Cardiovascular;  Laterality: N/A;   ABDOMINAL AORTAGRAM N/A 09/21/2014   Procedure: ABDOMINAL Tyler Whitehead;  Surgeon:  Tyler Dutch, MD;  Location: [facility] CATH LAB;  Service: Cardiovascular;  Laterality: N/A;   ABDOMINAL AORTOGRAM W/LOWER EXTREMITY N/A 03/28/2021   Procedure: ABDOMINAL AORTOGRAM W/LOWER EXTREMITY;  Surgeon: Tyler Mitchell, MD;  Location: [place] CV LAB;  Service: Cardiovascular;  Laterality: N/A;   CARDIAC CATHETERIZATION  May 2014   CORONARY ARTERY BYPASS GRAFT  2000   LM occluded, LIMA to LAD patent but distal native LAD disease, saphenous vein bypass graft to OM with 95% stenosis in the midportion, right coronary artery occluded, radial artery graft to right coronary artery patent but was diffusely small. Marginal graft stented. 7 2014. Of note he had multiple previous stents in the right coronary artery. The distal LAD was treated with angioplasty. I   LOOP RECORDER IMPLANT N/A 07/12/2014   Procedure: LOOP RECORDER IMPLANT;  Surgeon: Evans Lance, MD;  Location: [facility] CATH LAB;  Service: Cardiovascular;  Laterality: N/A;   LOWER EXTREMITY ANGIOGRAM  09/21/2014   Procedure: LOWER EXTREMITY ANGIOGRAM;  Surgeon: Tyler Dutch, MD;  Location: [facility] CATH LAB;  Service: Cardiovascular;;   PERCUTANEOUS CORONARY STENT INTERVENTION (PCI-S)  April 27 2013   right leg amputation  2017   TEE WITHOUT CARDIOVERSION N/A 07/12/2014   Procedure: TRANSESOPHAGEAL ECHOCARDIOGRAM (TEE);  Surgeon: Fay Records, MD;  Location: [facility] ENDOSCOPY;  Service: Cardiovascular;  Laterality: N/A;   VEIN SURGERY      I have reviewed the social history and family history with the patient and they are unchanged from previous note.  ALLERGIES:  is allergic to other.  MEDICATIONS:  Current Outpatient Medications  Medication Sig Dispense Refill   amoxicillin (AMOXIL) 500 MG capsule Take 500 mg by mouth 3 (three) times daily.     aspirin 81 MG chewable tablet Chew 81 mg by mouth daily.     atorvastatin (LIPITOR) 40 MG tablet Take 40 mg by mouth at bedtime.     carvedilol (COREG) 3.125 MG tablet Take 3.125 mg by mouth 2 (two)  times daily with a meal.     cilostazol (PLETAL) 100 MG tablet Take 100 mg by mouth 2 (two) times daily.     COVID-19 mRNA vaccine, Moderna, 100 MCG/0.5ML injection USE AS DIRECTED .25 mL 0   gabapentin (NEURONTIN) 300 MG capsule Take 300 mg by mouth at bedtime.     hydroxyurea (HYDREA) 500 MG capsule Take 2 capsules in morning and 1 capsule in evening 90 capsule 3   insulin glargine (LANTUS) 100 UNIT/ML injection Inject 10 Units into the skin at bedtime.     insulin lispro (HUMALOG) 100 UNIT/ML injection Inject 4 Units into the skin daily as needed for high blood sugar.     isosorbide mononitrate (IMDUR) 60 MG 24 hr tablet Take 1-2 tablets (60-120 mg total) by mouth 2 (two) times daily. 2 in the morning and 1 in the evening 270 tablet 3   Lancets (ONETOUCH DELICA PLUS [id number]) MISC Apply topically 2 (two) times daily.     levocetirizine (XYZAL) 5 MG tablet Take 5 mg by mouth every evening.     losartan (COZAAR) 25 MG tablet Take 25 mg by mouth at bedtime.     nitroGLYCERIN (NITROSTAT) 0.4 MG SL tablet Place 1 tablet (0.4 mg total) under the tongue every 5 (five) minutes as needed for chest pain. 25 tablet 12   ONETOUCH VERIO test strip 1 each 2 (two) times daily.     PROAIR HFA 108 (90 BASE) MCG/ACT inhaler Inhale 1 puff into the lungs every 4 (four) hours as needed for wheezing or shortness of breath.      No current facility-administered medications for this visit.    PHYSICAL EXAMINATION: ECOG PERFORMANCE STATUS: 2 - Symptomatic, <50% confined to bed  Vitals:   11/02/21 1500  BP: (!) 156/62  Pulse: 89  Resp: 18  Temp: 98 F (36.7 C)  SpO2: 100%   Wt Readings from Last 3 Encounters:  11/02/21 170 lb 12.8 oz (77.5 kg)  08/07/21 170 lb 9.6 oz (77.4 kg)  06/26/21 170 lb 9.6 oz (77.4 kg)     GENERAL:alert, no distress and comfortable SKIN: skin color normal, no rashes or significant lesions EYES: normal, Conjunctiva are pink and non-injected, sclera clear  NEURO: alert &  oriented x 3 with fluent speech  LABORATORY DATA:  I have reviewed the data as listed CBC Latest Ref Rng & Units 11/02/2021 08/28/2021 06/26/2021  WBC 4.0 - 10.5 K/uL 1.8(L) 16.3(H) 10.8(H)  Hemoglobin 13.0 - 17.0 g/dL 9.6(L) 11.6(L) 10.9(L)  Hematocrit 39.0 - 52.0 % 28.2(L) 36.0(L) 31.7(L)  Platelets 150 - 400 K/uL 301 570(H) 476(H)     CMP Latest Ref Rng & Units 11/02/2021 08/28/2021 06/26/2021  Glucose 70 - 99 mg/dL 244(H) 90 234(H)  BUN 8 - 23 mg/dL _0 Creatinine 0.61 - 1.24 mg/dL 1.19 0.98 1.09  Sodium 135 - 145 mmol/L 142 140 146(H)  Potassium 3.5 - 5.1 mmol/L 4.0 4.0 4.0  Chloride 98 - 111 mmol/L 108 108 112(H)  CO2 22 - 32 mmol/L _1 Calcium 8.9 - 10.3 mg/dL 8.6(L) 8.6(L) 9.3  Total Protein 6.5 - 8.1 g/dL 5.9(L) 6.3(L) 6.3(L)  Total Bilirubin 0.3 - 1.2 mg/dL 0.6 0.6 0.5  Alkaline Phos 38 - 126 U/L 65 80 93  AST 15 - 41 U/L 10(L) 14(L) 15  ALT 0 - 44 U/L _2 RADIOGRAPHIC STUDIES: I have personally reviewed the radiological images as listed and agreed with the findings in the report. No results Whitehead.    No orders of the defined types were placed in this encounter.  All questions were answered. The patient knows to call the clinic with any problems, questions or concerns. No barriers to learning was detected. The total time spent in the appointment was 25 minutes.     Truitt Merle, MD 11/02/2021   I, Wilburn Mylar, am acting as scribe for Truitt Merle, MD.   I have reviewed the above documentation for accuracy and completeness, and I agree with the above.

## 2021-11-03 DIAGNOSIS — R69 Illness, unspecified: Secondary | ICD-10-CM | POA: Diagnosis not present

## 2021-11-04 ENCOUNTER — Encounter: Payer: Self-pay | Admitting: Hematology

## 2021-11-06 ENCOUNTER — Other Ambulatory Visit: Payer: Self-pay

## 2021-11-06 DIAGNOSIS — D7581 Myelofibrosis: Secondary | ICD-10-CM

## 2021-11-07 DIAGNOSIS — Z23 Encounter for immunization: Secondary | ICD-10-CM | POA: Diagnosis not present

## 2021-11-07 DIAGNOSIS — R69 Illness, unspecified: Secondary | ICD-10-CM | POA: Diagnosis not present

## 2021-11-08 DIAGNOSIS — R69 Illness, unspecified: Secondary | ICD-10-CM | POA: Diagnosis not present

## 2021-11-09 ENCOUNTER — Telehealth: Payer: Self-pay

## 2021-11-09 ENCOUNTER — Other Ambulatory Visit: Payer: Self-pay

## 2021-11-09 NOTE — Telephone Encounter (Signed)
This nurse reached out to patients daughter per providers request.  Spoke with her about father stating he may be taking his medication incorrectly.  Advised that provider feels patient needs more supervision with taking his medications properly.  Daughter acknowledged understanding.  She asked is there any way she can find out more about his appointments and lab results.  I spoke with her about setting up access to [place].  Daughter stated that she will start attending his appointments with him. No further questions or concerns at this time.  Advised that patient has requested to have his labs drawn at his PCP due to cost of copay.  Daughter requested not to cancel his future lab appointments she does not mind paying the copay if it needs to be paid.  No changes made to future appointments at this time.

## 2021-11-10 DIAGNOSIS — Z01818 Encounter for other preprocedural examination: Secondary | ICD-10-CM | POA: Diagnosis not present

## 2021-11-10 DIAGNOSIS — R69 Illness, unspecified: Secondary | ICD-10-CM | POA: Diagnosis not present

## 2021-11-14 ENCOUNTER — Ambulatory Visit: Payer: Medicare HMO | Admitting: Podiatry

## 2021-11-16 DIAGNOSIS — R69 Illness, unspecified: Secondary | ICD-10-CM | POA: Diagnosis not present

## 2021-11-17 DIAGNOSIS — R69 Illness, unspecified: Secondary | ICD-10-CM | POA: Diagnosis not present

## 2021-11-21 ENCOUNTER — Ambulatory Visit (INDEPENDENT_AMBULATORY_CARE_PROVIDER_SITE_OTHER): Payer: No Typology Code available for payment source | Admitting: Podiatry

## 2021-11-21 ENCOUNTER — Other Ambulatory Visit: Payer: Self-pay

## 2021-11-21 DIAGNOSIS — M79675 Pain in left toe(s): Secondary | ICD-10-CM

## 2021-11-21 DIAGNOSIS — B353 Tinea pedis: Secondary | ICD-10-CM | POA: Diagnosis not present

## 2021-11-21 DIAGNOSIS — B351 Tinea unguium: Secondary | ICD-10-CM

## 2021-11-21 DIAGNOSIS — R69 Illness, unspecified: Secondary | ICD-10-CM | POA: Diagnosis not present

## 2021-11-21 DIAGNOSIS — M79674 Pain in right toe(s): Secondary | ICD-10-CM

## 2021-11-21 MED ORDER — KETOCONAZOLE 2 % EX CREA: 1.0000 "application " | TOPICAL_CREAM | Freq: Every day | CUTANEOUS | 2 refills | Status: DC

## 2021-11-21 NOTE — Progress Notes (Signed)
°  Subjective:  Patient ID: Tyler Whitehead, male    DOB: 09-17-1941,  MRN: [id number]  Chief Complaint  Patient presents with   Diabetes     extremely high risk foot care / L red on bottom , itching and  very painful /diabetic, status post AKA right    81 y.o. male presents with the above complaint. History confirmed with patient.  He developed a new issue a few weeks ago that was significant redness swelling itching and burning pain on the bottom of the foot.  He did a soak in vinegar and warm water and this alleviated most of it.  His nails are thick and elongated as well.  He is well-known to Dr. Elisha Ponder for routine care.  Objective:  Physical Exam: warm, good capillary refill and weak pedal pulses to palpation his foot is warm and well-perfused with good capillary fill time, he has thickened elongated nails x5 on the left side, previous AKA on the right side, dry scaling skin on the left side with no active ulceration.  Assessment:   1. Tinea pedis of left foot   2. Pain due to onychomycosis of toenails of both feet      Plan:  Patient was evaluated and treated and all questions answered.  Patient educated on diabetes. Discussed proper diabetic foot care and discussed risks and complications of disease. Educated patient in depth on reasons to return to the office immediately should he/she discover anything concerning or new on the feet. All questions answered. Discussed proper shoes as well.   Discussed the etiology and treatment options for the condition in detail with the patient. Educated patient on the topical and oral treatment options for mycotic nails. Recommended debridement of the nails today. Sharp and mechanical debridement performed of all painful and mycotic nails today. Nails debrided in length and thickness using a nail nipper to level of comfort. Discussed treatment options including appropriate shoe gear. Follow up as needed for painful nails.   Suspect the issue he  had was an eruption of tinea pedis on the left foot.  He essentially gave himself an acetic acid soak and this alleviated the issue.  I prescribed him ketoconazole to have on hand at home that he may use as needed if this recurs he will come to see me as needed for this.  Return in about 3 months (around 02/18/2022) for at risk diabetic foot care.

## 2021-11-22 ENCOUNTER — Ambulatory Visit: Payer: No Typology Code available for payment source | Attending: Internal Medicine

## 2021-11-22 DIAGNOSIS — R269 Unspecified abnormalities of gait and mobility: Secondary | ICD-10-CM | POA: Insufficient documentation

## 2021-11-22 DIAGNOSIS — R2681 Unsteadiness on feet: Secondary | ICD-10-CM | POA: Diagnosis not present

## 2021-11-22 DIAGNOSIS — M6281 Muscle weakness (generalized): Secondary | ICD-10-CM | POA: Diagnosis not present

## 2021-11-22 DIAGNOSIS — Z9181 History of falling: Secondary | ICD-10-CM | POA: Insufficient documentation

## 2021-11-22 DIAGNOSIS — R69 Illness, unspecified: Secondary | ICD-10-CM | POA: Diagnosis not present

## 2021-11-22 NOTE — Therapy (Signed)
OUTPATIENT PHYSICAL THERAPY LOWER EXTREMITY EVALUATION   Patient Name: Tyler Whitehead MRN: [id number] DOB:May 21, 1941, 81 y.o., male Today's Date: 11/22/2021   PT End of Session - 11/22/21 1625     Visit Number 1    Date for PT Re-Evaluation 01/17/22    Authorization Type [employer]    Authorization - Visit Number 1    PT Start Time 1612    PT Stop Time 1700    PT Time Calculation (min) 48 min    Activity Tolerance Patient tolerated treatment well    Behavior During Therapy WFL for tasks assessed/performed             Past Medical History:  Diagnosis Date   CAD (coronary artery disease)    a. S/p 3V CABG 2000 with LIMA-LAD, left radial-PDA, SVG-OM1. b. 2014: stent to SVG-OM1; then found to have obstruction distal to LAD anastamosis of LIMA s/p DES.    CHF (congestive heart failure) ([place])    Diverticular disease    DM (diabetes mellitus) ([place])    Hemorrhoid    HTN (hypertension)    Hyperlipidemia    Peripheral vascular disease ([place])    a. R femoropopliteal bypass ~2012 in [place], reportedly shown to be occluded. Now followed by Dr. [name].   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[place])    RBBB    Past Surgical History:  Procedure Laterality Date   ABDOMINAL ANGIOGRAM N/A 01/18/2015   Procedure: ABDOMINAL ANGIOGRAM;  Surgeon: Serafina Mitchell, MD;  Location: [facility] CATH LAB;  Service: Cardiovascular;  Laterality: N/A;   ABDOMINAL AORTAGRAM N/A 09/21/2014   Procedure: ABDOMINAL Maxcine Ham;  Surgeon: Elam Dutch, MD;  Location: [facility] CATH LAB;  Service: Cardiovascular;  Laterality: N/A;   ABDOMINAL AORTOGRAM W/LOWER EXTREMITY N/A 03/28/2021   Procedure: ABDOMINAL AORTOGRAM W/LOWER EXTREMITY;  Surgeon: Serafina Mitchell, MD;  Location: [place] CV LAB;  Service: Cardiovascular;  Laterality: N/A;   CARDIAC CATHETERIZATION  May 2014   CORONARY ARTERY BYPASS GRAFT  2000   LM occluded, LIMA to LAD patent but distal native LAD disease, saphenous vein bypass graft to OM with 95% stenosis in the  midportion, right coronary artery occluded, radial artery graft to right coronary artery patent but was diffusely small. Marginal graft stented. 7 2014. Of note he had multiple previous stents in the right coronary artery. The distal LAD was treated with angioplasty. I   LOOP RECORDER IMPLANT N/A 07/12/2014   Procedure: LOOP RECORDER IMPLANT;  Surgeon: Evans Lance, MD;  Location: [facility] CATH LAB;  Service: Cardiovascular;  Laterality: N/A;   LOWER EXTREMITY ANGIOGRAM  09/21/2014   Procedure: LOWER EXTREMITY ANGIOGRAM;  Surgeon: Elam Dutch, MD;  Location: [facility] CATH LAB;  Service: Cardiovascular;;   PERCUTANEOUS CORONARY STENT INTERVENTION (PCI-S)  April 27 2013   right leg amputation  2017   TEE WITHOUT CARDIOVERSION N/A 07/12/2014   Procedure: TRANSESOPHAGEAL ECHOCARDIOGRAM (TEE);  Surgeon: Fay Records, MD;  Location: [place];  Service: Cardiovascular;  Laterality: N/A;   VEIN SURGERY     Patient Active Problem List   Diagnosis Date Noted   Status post above-knee amputation of right lower extremity ([place]) 09/08/2021   Atherosclerosis of native artery of left lower extremity with rest pain ([place]) 09/08/2021   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 ([place]) 11/14/2020   Allergic rhinitis 11/14/2020   Chronic constipation 11/14/2020   Depression 11/14/2020   Diabetic peripheral neuropathy associated with type 2 diabetes mellitus ([place]) 11/14/2020   Dysphagia 11/14/2020  Hyperlipidemia 11/14/2020   Leukocytosis 11/14/2020   Nausea and vomiting 11/14/2020   Presence of aortocoronary bypass graft 11/14/2020   Pure hypercholesterolemia 11/14/2020   Vitamin D deficiency 11/14/2020   Cryptogenic stroke ([place]) 07/20/2015   History of stroke 07/06/2015   Transient vision disturbance, right 07/06/2015   Atherosclerosis of native arteries of extremity with intermittent claudication ([place]) 09/27/2014   Localization-related symptomatic epilepsy and epileptic syndromes with complex partial  seizures, not intractable, without status epilepticus ([place]) 08/04/2014   Occipital stroke ([place]) 08/04/2014   Cerebral thrombosis with cerebral infarction ([place]) 07/10/2014   New onset seizure ([place]) 07/09/2014   Diabetic hyperosmolar non-ketotic state ([place]) 07/09/2014   Uncontrolled hypertension 07/09/2014   Polycythemia vera ([place]) 07/09/2014   Seizure ([place]) 07/09/2014   Visual field defect 07/09/2014   Headache 06/30/2014   HTN (hypertension) 11/23/2013   Dyslipidemia 11/23/2013   Myelofibrosis ([place]) 11/22/2013   Diverticular disease 11/22/2013   Hemorrhoids 11/22/2013   S/P CABG x 3 11/22/2013   CAD (coronary artery disease) 11/22/2013   Peripheral vascular disease ([place]) 11/22/2013   DM2 (diabetes mellitus, type 2) ([place]) 11/22/2013   Renal cyst, left 11/22/2013   Splenomegaly 11/22/2013    PCP: Wenda Low, MD  REFERRING PROVIDER: Wenda Low, MD  REFERRING DIAG: Gait abnormality R26.9  THERAPY DIAG:  Muscle weakness (generalized)  History of falling  Unsteadiness on feet  ONSET DATE: 10-08-20  SUBJECTIVE:   SUBJECTIVE STATEMENT: Patient arrives via medical transportation.  He is an amputee on right at hip level.  He complains of difficulty walking and unsteady gait.  He states he has fallen about 3 times in the past 6 months.  He lives alone in an apartment building with an Media planner.  He has a son but he does not check in on him often.  Patients brother visits to help with making his bed or various other activities that he needs help with but for medical appts or other errands, he uses a medical transport that is set up with his insurance.  He would like to be able to walk with improved steadiness and confidence.    PERTINENT HISTORY: Diabetes, HTN, Hyperlipidemia, hip level amputee on right  PAIN:  Are you having pain? No NPRS scale: 0/10 Pain location: na Pain orientation: Other: right side chest pain occasionally   PAIN TYPE: aching Pain description:  intermittent  Aggravating factors: activity Relieving factors: nitroglycerin  PRECAUTIONS: Fall  WEIGHT BEARING RESTRICTIONS No  FALLS:  Has patient fallen in last 6 months? Yes, Number of falls: 3  LIVING ENVIRONMENT: Lives with: alone Lives in: House/apartment Stairs: No; External: 0 steps; elevator (apartment building) Has following equipment at home: Environmental consultant - 2 wheeled  OCCUPATION: retired  PLOF: Independent and Independent with basic ADLs (has some help with making his bed)  PATIENT GOALS "get to walking and moving better"   OBJECTIVE:   DIAGNOSTIC FINDINGS: na   COGNITION:  Overall cognitive status: Within functional limits for tasks assessed     SENSATION:  Impaired due to diabetes.    POSTURE:  Rounded shoulders / fwd head  PALPATION: Palpable pitting edema on left LE  LE AROM/PROM:  Amputee right at hip level,  left LE WFL  LE MMT:  Amputee on right, Left generally 4/5  FUNCTIONAL TESTS:  5 times sit to stand: 23.98 Timed up and go (TUG): 19.79  GAIT: Distance walked: 50 Assistive device utilized: Walker - 2 wheeled Level of assistance: Complete Independence Comments: good step length  TODAY'S TREATMENT: Initial evaluation completed, initiated HEP   PATIENT EDUCATION:  Education details: HEP Person educated: Patient Education method: Consulting civil engineer, Demonstration, Verbal cues, and Handouts Education comprehension: verbalized understanding, returned demonstration, verbal cues required, tactile cues required, and needs further education   HOME EXERCISE PROGRAM: Access Code: [email or web address] URL: [email or web address] Date: 11/22/2021 Prepared by: Candyce Churn  Exercises Sit to Stand with Armchair - 2 x daily - 7 x weekly - 2 sets - 10 reps Seated Long Arc Quad - 2 x daily - 7 x weekly - 1 sets - 20 reps Seated March - 2 x daily - 7 x weekly - 1 sets - 20 reps   ASSESSMENT:  CLINICAL IMPRESSION: Patient is a 81 y.o.  male who was seen today for physical therapy evaluation and treatment for unsteadiness and gait abnormality.      OBJECTIVE IMPAIRMENTS Abnormal gait, cardiopulmonary status limiting activity, decreased activity tolerance, decreased balance, decreased knowledge of use of DME, difficulty walking, decreased strength, impaired flexibility, impaired sensation, postural dysfunction, and prosthetic dependency .   ACTIVITY LIMITATIONS cleaning, community activity, driving, meal prep, laundry, and shopping.   PERSONAL FACTORS Age, Fitness, Past/current experiences, Social background, Time since onset of injury/illness/exacerbation, Transportation, and 1-2 comorbidities: HTN, Diabetes, Hyperlipidemia, chest pain  are also affecting patient's functional outcome.    REHAB POTENTIAL: Good  CLINICAL DECISION MAKING: Evolving/moderate complexity  EVALUATION COMPLEXITY: Moderate   GOALS: Goals reviewed with patient? Yes  SHORT TERM GOALS:  STG Name Target Date Goal status  1 Independence with initial HEP Baseline:  12/20/2021 INITIAL   LONG TERM GOALS:   LTG Name Target Date Goal status  1 Independence with advanced HEP Baseline: 01/17/2022 INITIAL  2 Patient to improve TUG score by 3 sec Baseline: 01/17/2022 INITIAL  3 Patient to improve 5 times sit to stand by 2 sec Baseline: 01/17/2022 INITIAL  4 Patient to report 75% improvement in strength and confidence with walking Baseline: 01/17/2022 INITIAL  5 Patient to be able to come to stand with proper technique avoiding back of LE support on sitting surface Baseline: 01/17/2022 INITIAL   PLAN: PT FREQUENCY: 1-2x/week  PT DURATION: 8 weeks  PLANNED INTERVENTIONS: Therapeutic exercises, Therapeutic activity, Neuro Muscular re-education, Balance training, Gait training, Patient/Family education, Joint mobilization, Stair training, Prosthetic training, DME instructions, Aquatic Therapy, Cryotherapy, Moist heat, Taping, and Manual therapy  PLAN  FOR NEXT SESSION: Review HEP, Nustep, balance training   Isabel Caprice, PT 11/22/2021, 4:30 PM

## 2021-11-28 ENCOUNTER — Encounter: Payer: Self-pay | Admitting: Physical Therapy

## 2021-11-28 ENCOUNTER — Ambulatory Visit: Payer: No Typology Code available for payment source | Admitting: Physical Therapy

## 2021-11-28 ENCOUNTER — Other Ambulatory Visit: Payer: Self-pay

## 2021-11-28 DIAGNOSIS — M6281 Muscle weakness (generalized): Secondary | ICD-10-CM

## 2021-11-28 DIAGNOSIS — R2681 Unsteadiness on feet: Secondary | ICD-10-CM

## 2021-11-28 NOTE — Therapy (Signed)
OUTPATIENT PHYSICAL THERAPY TREATMENT NOTE   Patient Name: Tyler Whitehead MRN: [id number] DOB:[birth year], 81 y.o., male Today's Date: 11/28/2021  PCP: Wenda Low, MD REFERRING PROVIDER: Wenda Low, MD   PT End of Session - 11/28/21 1106     Visit Number 2    Date for PT Re-Evaluation 01/17/22    Authorization Type [employer]    Authorization - Visit Number 2    PT Start Time 1101    PT Stop Time 1144    PT Time Calculation (min) 43 min    Activity Tolerance Patient tolerated treatment well    Behavior During Therapy [facility] for tasks assessed/performed             Past Medical History:  Diagnosis Date   CAD (coronary artery disease)    a. S/p 3V CABG 2000 with LIMA-LAD, left radial-PDA, SVG-OM1. b. 2014: stent to SVG-OM1; then found to have obstruction distal to LAD anastamosis of LIMA s/p DES.    CHF (congestive heart failure) ([place])    Diverticular disease    DM (diabetes mellitus) ([place])    Hemorrhoid    HTN (hypertension)    Hyperlipidemia    Peripheral vascular disease ([place])    a. R femoropopliteal bypass ~2012 in [place], reportedly shown to be occluded. Now followed by Dr. [name].   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[place])    RBBB    Past Surgical History:  Procedure Laterality Date   ABDOMINAL ANGIOGRAM N/A 01/18/2015   Procedure: ABDOMINAL ANGIOGRAM;  Surgeon: Serafina Mitchell, MD;  Location: [facility] CATH LAB;  Service: Cardiovascular;  Laterality: N/A;   ABDOMINAL AORTAGRAM N/A 09/21/2014   Procedure: ABDOMINAL Maxcine Ham;  Surgeon: Elam Dutch, MD;  Location: [facility] CATH LAB;  Service: Cardiovascular;  Laterality: N/A;   ABDOMINAL AORTOGRAM W/LOWER EXTREMITY N/A 03/28/2021   Procedure: ABDOMINAL AORTOGRAM W/LOWER EXTREMITY;  Surgeon: Serafina Mitchell, MD;  Location: [place] CV LAB;  Service: Cardiovascular;  Laterality: N/A;   CARDIAC CATHETERIZATION  May 2014   CORONARY ARTERY BYPASS GRAFT  2000   LM occluded, LIMA to LAD patent but distal native LAD disease, saphenous  vein bypass graft to OM with 95% stenosis in the midportion, right coronary artery occluded, radial artery graft to right coronary artery patent but was diffusely small. Marginal graft stented. 7 2014. Of note he had multiple previous stents in the right coronary artery. The distal LAD was treated with angioplasty. I   LOOP RECORDER IMPLANT N/A 07/12/2014   Procedure: LOOP RECORDER IMPLANT;  Surgeon: Evans Lance, MD;  [facility] CATH LAB;  Service: Cardiovascular;  Laterality: N/A;   LOWER EXTREMITY ANGIOGRAM  09/21/2014   Procedure: LOWER EXTREMITY ANGIOGRAM;  Surgeon: Elam Dutch, MD;  Location: [facility] CATH LAB;  Service: Cardiovascular;;   PERCUTANEOUS CORONARY STENT INTERVENTION (PCI-S)  April 27 2013   right leg amputation  2017   TEE WITHOUT CARDIOVERSION N/A 07/12/2014   Procedure: TRANSESOPHAGEAL ECHOCARDIOGRAM (TEE);  Surgeon: Fay Records, MD;  Location: [place];  Service: Cardiovascular;  Laterality: N/A;   VEIN SURGERY     Patient Active Problem List   Diagnosis Date Noted   Status post above-knee amputation of right lower extremity ([place]) 09/08/2021   Atherosclerosis of native artery of left lower extremity with rest pain ([place]) 09/08/2021   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 ([place]) 11/14/2020   Allergic rhinitis 11/14/2020   Chronic constipation 11/14/2020   Depression 11/14/2020   Diabetic peripheral neuropathy associated with type 2 diabetes  mellitus ([place]) 11/14/2020   Dysphagia 11/14/2020   Hyperlipidemia 11/14/2020   Leukocytosis 11/14/2020   Nausea and vomiting 11/14/2020   Presence of aortocoronary bypass graft 11/14/2020   Pure hypercholesterolemia 11/14/2020   Vitamin D deficiency 11/14/2020   Cryptogenic stroke ([place]) 07/20/2015   History of stroke 07/06/2015   Transient vision disturbance, right 07/06/2015   Atherosclerosis of native arteries of extremity with intermittent claudication ([place]) 09/27/2014   Localization-related symptomatic epilepsy  and epileptic syndromes with complex partial seizures, not intractable, without status epilepticus ([place]) 08/04/2014   Occipital stroke ([place]) 08/04/2014   Cerebral thrombosis with cerebral infarction ([place]) 07/10/2014   New onset seizure ([place]) 07/09/2014   Diabetic hyperosmolar non-ketotic state ([place]) 07/09/2014   Uncontrolled hypertension 07/09/2014   Polycythemia vera ([place]) 07/09/2014   Seizure ([place]) 07/09/2014   Visual field defect 07/09/2014   Headache 06/30/2014   HTN (hypertension) 11/23/2013   Dyslipidemia 11/23/2013   Myelofibrosis ([place]) 11/22/2013   Diverticular disease 11/22/2013   Hemorrhoids 11/22/2013   S/P CABG x 3 11/22/2013   CAD (coronary artery disease) 11/22/2013   Peripheral vascular disease ([place]) 11/22/2013   DM2 (diabetes mellitus, type 2) ([place]) 11/22/2013   Renal cyst, left 11/22/2013   Splenomegaly 11/22/2013    REFERRING DIAG: R26.9 (ICD-10-CM) - Unspecified abnormalities of gait and mobility  THERAPY DIAG:  Muscle weakness (generalized)  Unsteadiness on feet  PERTINENT HISTORY: Diabetes, HTN, Hyperlipidemia, hip level amputee on right  PRECAUTIONS: fall risk, Rt hip level amputee, walker use  SUBJECTIVE: Pt reports he has been doing HEP and motivated to improve with walker. Pt reports he thinks his biggest problem is balance and sometimes his Rt leg bumps into the walker leg or because of his Rt hand grip weakness it will slide off of Rt handle on walker.   PAIN:  Are you having pain? No     OBJECTIVE: from eval 11/22/21   DIAGNOSTIC FINDINGS: na     COGNITION:          Overall cognitive status: Within functional limits for tasks assessed                        SENSATION:          Impaired due to diabetes.     POSTURE:  Rounded shoulders / fwd head   PALPATION: Palpable pitting edema on left LE   LE AROM/PROM:   Amputee right at hip level,  left LE WFL   LE MMT:   Amputee on right, Left generally 4/5   FUNCTIONAL TESTS:  5  times sit to stand: 23.98 Timed up and go (TUG): 19.79   GAIT: Distance walked: 50 Assistive device utilized: Environmental consultant - 2 wheeled Level of assistance: Complete Independence Comments: good step length        PATIENT EDUCATION:  Education details: HEP Person educated: Patient Education method: Consulting civil engineer, Media planner, Verbal cues, and Handouts Education comprehension: verbalized understanding, returned demonstration, verbal cues required, tactile cues required, and needs further education CLINICAL IMPRESSION: Patient is a 81 y.o. male who was seen today for balance and gait training. Pt demonstrated noticeable heel strike on Lt heel however limited in gait mechanics due to prosthetic. Focus of session was gait training and standing balance training with pt requiring several short rest breaks due to fatigue as well as VC for technique throughout session. Pt tolerated well overall and would benefit from additional PT for further addressing of deficits.      OBJECTIVE IMPAIRMENTS  Abnormal gait, cardiopulmonary status limiting activity, decreased activity tolerance, decreased balance, decreased knowledge of use of DME, difficulty walking, decreased strength, impaired flexibility, impaired sensation, postural dysfunction, and prosthetic dependency .    ACTIVITY LIMITATIONS cleaning, community activity, driving, meal prep, laundry, and shopping.    PERSONAL FACTORS Age, Fitness, Past/current experiences, Social background, Time since onset of injury/illness/exacerbation, Transportation, and 1-2 comorbidities: HTN, Diabetes, Hyperlipidemia, chest pain  are also affecting patient's functional outcome.      REHAB POTENTIAL: Good   CLINICAL DECISION MAKING: Evolving/moderate complexity   EVALUATION COMPLEXITY: Moderate     GOALS: Goals reviewed with patient? Yes   SHORT TERM GOALS:   STG Name Target Date Goal status  1 Independence with initial HEP Baseline:  12/20/2021 INITIAL     LONG TERM GOALS:    LTG Name Target Date Goal status  1 Independence with advanced HEP Baseline: 01/17/2022 INITIAL  2 Patient to improve TUG score by 3 sec Baseline: 01/17/2022 INITIAL  3 Patient to improve 5 times sit to stand by 2 sec Baseline: 01/17/2022 INITIAL  4 Patient to report 75% improvement in strength and confidence with walking Baseline: 01/17/2022 INITIAL  5 Patient to be able to come to stand with proper technique avoiding back of LE support on sitting surface Baseline: 01/17/2022 INITIAL    PLAN: PT FREQUENCY: 1-2x/week   PT DURATION: 8 weeks   PLANNED INTERVENTIONS: Therapeutic exercises, Therapeutic activity, Neuro Muscular re-education, Balance training, Gait training, Patient/Family education, Joint mobilization, Stair training, Prosthetic training, DME instructions, Aquatic Therapy, Cryotherapy, Moist heat, Taping, and Manual therapy   PLAN FOR NEXT SESSION: Review HEP, Nustep, balance training    Treatment:11/28/2021 Pt directed in gait training with rolling walker for 320'x3 at CGA with VC for decreased Rt step length to decrease kick out from prosthetic however pt reports he walks to slow with that and did need rest breaks between each rep due to fatigue. Pt demonstrated improved gait mechanics though does have decreased cadence with this technique. Pt directed in 2x5 Sit to stand from mat table with bil UE support to ascend from mat, VC for technique and rest break needed between set. Pt also directed in 2x45s Lt hamstring stretches, 2x10 standing marches with walker for support at CGA limited on Rt due to amputation but able to lift Rt hip flexion; standing hip abduction at parallel bars for support 2x10 each. Pt reports his goal is to be safer at home and eventually would like to walk without a walker.     Stacy Gardner, PT, DPT 02/21/232:03 PM

## 2021-11-29 ENCOUNTER — Ambulatory Visit: Payer: No Typology Code available for payment source

## 2021-11-29 DIAGNOSIS — M6281 Muscle weakness (generalized): Secondary | ICD-10-CM

## 2021-11-29 DIAGNOSIS — Z9181 History of falling: Secondary | ICD-10-CM

## 2021-11-29 DIAGNOSIS — R2681 Unsteadiness on feet: Secondary | ICD-10-CM

## 2021-11-29 NOTE — Therapy (Signed)
OUTPATIENT PHYSICAL THERAPY TREATMENT NOTE   Patient Name: Tyler Whitehead MRN: [id number] DOB:1941/02/28, 81 y.o., male Today's Date: 11/29/2021  PCP: Wenda Low, MD REFERRING PROVIDER: Wenda Low, MD   PT End of Session - 11/29/21 1107     Visit Number 3    Date for PT Re-Evaluation 01/17/22    Authorization Type [employer]    Authorization - Visit Number 3    PT Start Time 1104    PT Stop Time 1145    PT Time Calculation (min) 41 min    Activity Tolerance Patient tolerated treatment well    Behavior During Therapy [facility] for tasks assessed/performed             Past Medical History:  Diagnosis Date   CAD (coronary artery disease)    a. S/p 3V CABG 2000 with LIMA-LAD, left radial-PDA, SVG-OM1. b. 2014: stent to SVG-OM1; then found to have obstruction distal to LAD anastamosis of LIMA s/p DES.    CHF (congestive heart failure) ([place])    Diverticular disease    DM (diabetes mellitus) ([place])    Hemorrhoid    HTN (hypertension)    Hyperlipidemia    Peripheral vascular disease ([place])    a. R femoropopliteal bypass ~2012 in [place], reportedly shown to be occluded. Now followed by Dr. [name].   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[place])    RBBB    Past Surgical History:  Procedure Laterality Date   ABDOMINAL ANGIOGRAM N/A 01/18/2015   Procedure: ABDOMINAL ANGIOGRAM;  Surgeon: Serafina Mitchell, MD;  Location: [facility] CATH LAB;  Service: Cardiovascular;  Laterality: N/A;   ABDOMINAL AORTAGRAM N/A 09/21/2014   Procedure: ABDOMINAL Maxcine Ham;  Surgeon: Elam Dutch, MD;  Location: [facility] CATH LAB;  Service: Cardiovascular;  Laterality: N/A;   ABDOMINAL AORTOGRAM W/LOWER EXTREMITY N/A 03/28/2021   Procedure: ABDOMINAL AORTOGRAM W/LOWER EXTREMITY;  Surgeon: Serafina Mitchell, MD;  Location: [place] CV LAB;  Service: Cardiovascular;  Laterality: N/A;   CARDIAC CATHETERIZATION  May 2014   CORONARY ARTERY BYPASS GRAFT  2000   LM occluded, LIMA to LAD patent but distal native LAD disease, saphenous  vein bypass graft to OM with 95% stenosis in the midportion, right coronary artery occluded, radial artery graft to right coronary artery patent but was diffusely small. Marginal graft stented. 7 2014. Of note he had multiple previous stents in the right coronary artery. The distal LAD was treated with angioplasty. I   LOOP RECORDER IMPLANT N/A 07/12/2014   Procedure: LOOP RECORDER IMPLANT;  Surgeon: Evans Lance, MD;  Location: [facility] CATH LAB;  Service: Cardiovascular;  Laterality: N/A;   LOWER EXTREMITY ANGIOGRAM  09/21/2014   Procedure: LOWER EXTREMITY ANGIOGRAM;  Surgeon: Elam Dutch, MD;  Location: [facility] CATH LAB;  Service: Cardiovascular;;   PERCUTANEOUS CORONARY STENT INTERVENTION (PCI-S)  April 27 2013   right leg amputation  2017   TEE WITHOUT CARDIOVERSION N/A 07/12/2014   Procedure: TRANSESOPHAGEAL ECHOCARDIOGRAM (TEE);  Surgeon: Fay Records, MD;  Location: [place];  Service: Cardiovascular;  Laterality: N/A;   VEIN SURGERY     Patient Active Problem List   Diagnosis Date Noted   Status post above-knee amputation of right lower extremity ([place]) 09/08/2021   Atherosclerosis of native artery of left lower extremity with rest pain ([place]) 09/08/2021   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 ([place]) 11/14/2020   Allergic rhinitis 11/14/2020   Chronic constipation 11/14/2020   Depression 11/14/2020   Diabetic peripheral neuropathy associated with type 2 diabetes  mellitus ([place]) 11/14/2020   Dysphagia 11/14/2020   Hyperlipidemia 11/14/2020   Leukocytosis 11/14/2020   Nausea and vomiting 11/14/2020   Presence of aortocoronary bypass graft 11/14/2020   Pure hypercholesterolemia 11/14/2020   Vitamin D deficiency 11/14/2020   Cryptogenic stroke ([place]) 07/20/2015   History of stroke 07/06/2015   Transient vision disturbance, right 07/06/2015   Atherosclerosis of native arteries of extremity with intermittent claudication ([place]) 09/27/2014   Localization-related symptomatic epilepsy  and epileptic syndromes with complex partial seizures, not intractable, without status epilepticus ([place]) 08/04/2014   Occipital stroke ([place]) 08/04/2014   Cerebral thrombosis with cerebral infarction ([place]) 07/10/2014   New onset seizure ([place]) 07/09/2014   Diabetic hyperosmolar non-ketotic state ([place]) 07/09/2014   Uncontrolled hypertension 07/09/2014   Polycythemia vera ([place]) 07/09/2014   Seizure ([place]) 07/09/2014   Visual field defect 07/09/2014   Headache 06/30/2014   HTN (hypertension) 11/23/2013   Dyslipidemia 11/23/2013   Myelofibrosis ([place]) 11/22/2013   Diverticular disease 11/22/2013   Hemorrhoids 11/22/2013   S/P CABG x 3 11/22/2013   CAD (coronary artery disease) 11/22/2013   Peripheral vascular disease ([place]) 11/22/2013   DM2 (diabetes mellitus, type 2) ([place]) 11/22/2013   Renal cyst, left 11/22/2013   Splenomegaly 11/22/2013    REFERRING DIAG: R26.9 (ICD-10-CM) - Unspecified abnormalities of gait and mobility  THERAPY DIAG:  Muscle weakness (generalized)  Unsteadiness on feet  History of falling  PERTINENT HISTORY: Diabetes, HTN, Hyperlipidemia, hip level amputee on right  PRECAUTIONS: fall risk, Rt hip level amputee, walker use  SUBJECTIVE: Patient states his grip on right is better with new walker.  He states he has not fallen with new walker.    PAIN:  Are you having pain? No     OBJECTIVE: from eval 11/22/21   DIAGNOSTIC FINDINGS: na     COGNITION:          Overall cognitive status: Within functional limits for tasks assessed                        SENSATION:          Impaired due to diabetes.     POSTURE:  Rounded shoulders / fwd head   PALPATION: Palpable pitting edema on left LE   LE AROM/PROM:   Amputee right at hip level,  left LE WFL   LE MMT:   Amputee on right, Left generally 4/5   FUNCTIONAL TESTS:  5 times sit to stand: 23.98 Timed up and go (TUG): 19.79   GAIT: Distance walked: 50 Assistive device utilized: Walker - 2  wheeled Level of assistance: Complete Independence Comments: good step length        PATIENT EDUCATION:              Educated patient on grip strength exercises.  Suggested purchasing a small flexible ball and some putty to do these exercises.   Education details: HEP Person educated: Patient Education method: Education officer, environmental, Verbal cues, and Handouts Education comprehension: verbalized understanding, returned demonstration, verbal cues required, tactile cues required, and needs further education  CLINICAL IMPRESSION: Patient was able to complete all tasks with minimal fatigue.  He recalls instructions to shorten right step length but admits this is habit for him to be sure his prosthesis locks.  He needed only minor verbal cues as a reminder for this.  He is well motivated and compliant.  He would benefit from continuing skilled PT for balance training and Le strengthening to reduce fall risk.  We added grip strength exercises to improve his grip on walker as this has been the cause for several of his falls.      OBJECTIVE IMPAIRMENTS Abnormal gait, cardiopulmonary status limiting activity, decreased activity tolerance, decreased balance, decreased knowledge of use of DME, difficulty walking, decreased strength, impaired flexibility, impaired sensation, postural dysfunction, and prosthetic dependency .    ACTIVITY LIMITATIONS cleaning, community activity, driving, meal prep, laundry, and shopping.    PERSONAL FACTORS Age, Fitness, Past/current experiences, Social background, Time since onset of injury/illness/exacerbation, Transportation, and 1-2 comorbidities: HTN, Diabetes, Hyperlipidemia, chest pain  are also affecting patient's functional outcome.      REHAB POTENTIAL: Good   CLINICAL DECISION MAKING: Evolving/moderate complexity   EVALUATION COMPLEXITY: Moderate     GOALS: Goals reviewed with patient? Yes   SHORT TERM GOALS:   STG Name Target Date Goal status  1  Independence with initial HEP Baseline:  12/20/2021 INITIAL    LONG TERM GOALS:    LTG Name Target Date Goal status  1 Independence with advanced HEP Baseline: 01/17/2022 INITIAL  2 Patient to improve TUG score by 3 sec Baseline: 01/17/2022 INITIAL  3 Patient to improve 5 times sit to stand by 2 sec Baseline: 01/17/2022 INITIAL  4 Patient to report 75% improvement in strength and confidence with walking Baseline: 01/17/2022 INITIAL  5 Patient to be able to come to stand with proper technique avoiding back of LE support on sitting surface Baseline: 01/17/2022 INITIAL    PLAN: PT FREQUENCY: 1-2x/week   PT DURATION: 8 weeks   PLANNED INTERVENTIONS: Therapeutic exercises, Therapeutic activity, Neuro Muscular re-education, Balance training, Gait training, Patient/Family education, Joint mobilization, Stair training, Prosthetic training, DME instructions, Aquatic Therapy, Cryotherapy, Moist heat, Taping, and Manual therapy   PLAN FOR NEXT SESSION: Review HEP, Nustep, balance training  Treatment:11/30/2021 Seated toe and heel raises right only x 20 Seated LAQ right only x 20 Sit to stand x 5  Parallel bars: Standing hip ext x 10 each, abd x 10 ea, step tap up x 10 each LE, x 10 more with left hand hold only, then attempted right hand hold only but patient unable to complete safely,  lunges lateral x 10 each LE,   Gait training: with rolling walker, 350 ft with SBA with VC's for decreased step length on right.     Treatment:11/29/2021 Pt directed in gait training with rolling walker for 320'x3 at [place] with VC for decreased Rt step length to decrease kick out from prosthetic. Pt demonstrated improved gait mechanics though does have decreased cadence with this technique. Pt directed in 2x5 Sit to stand from mat table with bil UE support to ascend from mat, VC for technique and rest break needed between set. Pt also directed in 2x45s Lt hamstring stretches, 2x10 standing marches with walker for support  at CGA limited on Rt due to amputation but able to lift Rt hip flexion; standing hip abduction at parallel bars for support 2x10 each. Pt reports his goal is to be safer at home and eventually would like to walk without a walker.     Anderson Malta B. Demontray Franta, PT 11/29/2310:21 PM

## 2021-11-30 ENCOUNTER — Other Ambulatory Visit: Payer: Self-pay

## 2021-11-30 ENCOUNTER — Ambulatory Visit: Payer: No Typology Code available for payment source

## 2021-11-30 ENCOUNTER — Telehealth: Payer: Self-pay

## 2021-11-30 DIAGNOSIS — E1151 Type 2 diabetes mellitus with diabetic peripheral angiopathy without gangrene: Secondary | ICD-10-CM

## 2021-11-30 DIAGNOSIS — Z89611 Acquired absence of right leg above knee: Secondary | ICD-10-CM

## 2021-11-30 NOTE — Progress Notes (Signed)
SITUATION Reason for Consult: Evaluation for Prefabricated Diabetic Shoes and Custom Diabetic Inserts. Patient / Caregiver Report: Patient would like well fitting shoes  OBJECTIVE DATA: Patient History / Diagnosis:    ICD-10-CM   1. Type II diabetes mellitus with peripheral circulatory disorder (HCC)  E11.51     2. 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  GOALS OF SHOES AND INSOLES - Reduce shear and pressure - Reduce / Prevent callus formation - Reduce / Prevent ulceration - Protect the fragile healing compromised diabetic foot.  Patient would benefit from diabetic shoes and inserts as patient has diabetes mellitus and the patient has one or more of the following conditions: - History of partial or complete amputation of the foot - History of previous foot ulceration. - History of pre-ulcerative callus - Peripheral neuropathy with evidence of callus formation - Foot deformity - Poor circulation  ACTIONS PERFORMED Patient was casted for insoles via crush box and measured for shoes via brannock device. Procedure was explained and patient tolerated procedure well. All questions were answered and concerns addressed.  PLAN Patient is to be contacted and scheduled for fitting once CMN is obtained from treaing physician and shoes and insoles have been fabricated and received.

## 2021-11-30 NOTE — Telephone Encounter (Signed)
Casts sent to central fab - HOLD FOR CMN

## 2021-12-05 ENCOUNTER — Other Ambulatory Visit: Payer: Self-pay

## 2021-12-05 ENCOUNTER — Ambulatory Visit: Payer: No Typology Code available for payment source

## 2021-12-05 DIAGNOSIS — Z9181 History of falling: Secondary | ICD-10-CM

## 2021-12-05 DIAGNOSIS — R2681 Unsteadiness on feet: Secondary | ICD-10-CM

## 2021-12-05 DIAGNOSIS — M6281 Muscle weakness (generalized): Secondary | ICD-10-CM | POA: Diagnosis not present

## 2021-12-05 NOTE — Therapy (Signed)
OUTPATIENT PHYSICAL THERAPY TREATMENT NOTE   Patient Name: NACHMAN SUNDT MRN: [id number] DOB:02-04-1941, 81 y.o., male Today's Date: 12/05/2021  PCP: Wenda Low, MD REFERRING PROVIDER: Wenda Low, MD   PT End of Session - 12/05/21 1239     Visit Number 4    Date for PT Re-Evaluation 01/17/22    Authorization Type [employer]    Progress Note Due on Visit 10    PT Start Time 1146    PT Stop Time 1232    PT Time Calculation (min) 46 min    Activity Tolerance Patient tolerated treatment well    Behavior During Therapy [facility] for tasks assessed/performed              Past Medical History:  Diagnosis Date   CAD (coronary artery disease)    a. S/p 3V CABG 2000 with LIMA-LAD, left radial-PDA, SVG-OM1. b. 2014: stent to SVG-OM1; then found to have obstruction distal to LAD anastamosis of LIMA s/p DES.    CHF (congestive heart failure) ([place])    Diverticular disease    DM (diabetes mellitus) ([place])    Hemorrhoid    HTN (hypertension)    Hyperlipidemia    Peripheral vascular disease ([place])    a. R femoropopliteal bypass ~2012 in [place], reportedly shown to be occluded. Now followed by Dr. [name].   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[place])    RBBB    Past Surgical History:  Procedure Laterality Date   ABDOMINAL ANGIOGRAM N/A 01/18/2015   Procedure: ABDOMINAL ANGIOGRAM;  Surgeon: Serafina Mitchell, MD;  Location: [facility] CATH LAB;  Service: Cardiovascular;  Laterality: N/A;   ABDOMINAL AORTAGRAM N/A 09/21/2014   Procedure: ABDOMINAL Maxcine Ham;  Surgeon: Elam Dutch, MD;  Location: [facility] CATH LAB;  Service: Cardiovascular;  Laterality: N/A;   ABDOMINAL AORTOGRAM W/LOWER EXTREMITY N/A 03/28/2021   Procedure: ABDOMINAL AORTOGRAM W/LOWER EXTREMITY;  Surgeon: Serafina Mitchell, MD;  Location: [place] CV LAB;  Service: Cardiovascular;  Laterality: N/A;   CARDIAC CATHETERIZATION  May 2014   CORONARY ARTERY BYPASS GRAFT  2000   LM occluded, LIMA to LAD patent but distal native LAD disease,  saphenous vein bypass graft to OM with 95% stenosis in the midportion, right coronary artery occluded, radial artery graft to right coronary artery patent but was diffusely small. Marginal graft stented. 7 2014. Of note he had multiple previous stents in the right coronary artery. The distal LAD was treated with angioplasty. I   LOOP RECORDER IMPLANT N/A 07/12/2014   Procedure: LOOP RECORDER IMPLANT;  Surgeon: Evans Lance, MD;  Location: [facility] CATH LAB;  Service: Cardiovascular;  Laterality: N/A;   LOWER EXTREMITY ANGIOGRAM  09/21/2014   Procedure: LOWER EXTREMITY ANGIOGRAM;  Surgeon: Elam Dutch, MD;  Location: [facility] CATH LAB;  Service: Cardiovascular;;   PERCUTANEOUS CORONARY STENT INTERVENTION (PCI-S)  April 27 2013   right leg amputation  2017   TEE WITHOUT CARDIOVERSION N/A 07/12/2014   Procedure: TRANSESOPHAGEAL ECHOCARDIOGRAM (TEE);  Surgeon: Fay Records, MD;  Location: [place];  Service: Cardiovascular;  Laterality: N/A;   VEIN SURGERY     Patient Active Problem List   Diagnosis Date Noted   Status post above-knee amputation of right lower extremity ([place]) 09/08/2021   Atherosclerosis of native artery of left lower extremity with rest pain ([place]) 09/08/2021   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 ([place]) 11/14/2020   Allergic rhinitis 11/14/2020   Chronic constipation 11/14/2020   Depression 11/14/2020   Diabetic peripheral neuropathy associated with type  2 diabetes mellitus ([place]) 11/14/2020   Dysphagia 11/14/2020   Hyperlipidemia 11/14/2020   Leukocytosis 11/14/2020   Nausea and vomiting 11/14/2020   Presence of aortocoronary bypass graft 11/14/2020   Pure hypercholesterolemia 11/14/2020   Vitamin D deficiency 11/14/2020   Cryptogenic stroke ([place]) 07/20/2015   History of stroke 07/06/2015   Transient vision disturbance, right 07/06/2015   Atherosclerosis of native arteries of extremity with intermittent claudication ([place]) 09/27/2014   Localization-related symptomatic  epilepsy and epileptic syndromes with complex partial seizures, not intractable, without status epilepticus ([place]) 08/04/2014   Occipital stroke ([place]) 08/04/2014   Cerebral thrombosis with cerebral infarction ([place]) 07/10/2014   New onset seizure ([place]) 07/09/2014   Diabetic hyperosmolar non-ketotic state ([place]) 07/09/2014   Uncontrolled hypertension 07/09/2014   Polycythemia vera ([place]) 07/09/2014   Seizure ([place]) 07/09/2014   Visual field defect 07/09/2014   Headache 06/30/2014   HTN (hypertension) 11/23/2013   Dyslipidemia 11/23/2013   Myelofibrosis ([place]) 11/22/2013   Diverticular disease 11/22/2013   Hemorrhoids 11/22/2013   S/P CABG x 3 11/22/2013   CAD (coronary artery disease) 11/22/2013   Peripheral vascular disease ([place]) 11/22/2013   DM2 (diabetes mellitus, type 2) ([place]) 11/22/2013   Renal cyst, left 11/22/2013   Splenomegaly 11/22/2013    REFERRING DIAG: R26.9 (ICD-10-CM) - Unspecified abnormalities of gait and mobility  THERAPY DIAG:  Muscle weakness (generalized)  Unsteadiness on feet  History of falling  PERTINENT HISTORY: Diabetes, HTN, Hyperlipidemia, hip level amputee on right  PRECAUTIONS: fall risk, Rt hip level amputee, walker use  SUBJECTIVE: I did 50 long arc quads on the Lt this morning.  Walking is going well with walker.    PAIN:  Are you having pain? No  OBJECTIVE: from eval 11/22/21   DIAGNOSTIC FINDINGS: na   COGNITION:          Overall cognitive status: Within functional limits for tasks assessed                  SENSATION:          Impaired due to diabetes.   POSTURE:  Rounded shoulders / fwd head PALPATION: Palpable pitting edema on left LE  LE AROM/PROM:   Amputee right at hip level,  left LE WFL   LE MMT:   Amputee on right, Left generally 4/5   FUNCTIONAL TESTS:  5 times sit to stand: 23.98 Timed up and go (TUG): 19.79   GAIT: Distance walked: 50 Assistive device utilized: Walker - 2 wheeled Level of assistance: Complete  Independence Comments: good step length   TODAY'S TREATMENT:  Treatment: 12/05/2021 NuStep: Level 6 x 7  minutes (400 steps)- pt took a rest breat at 3 min due to shortness of breath. Seated toe and heel raises Lt only x 20 Seated LAQ Lt only x 20 Sit to stand  2 x 10 UEs on parallel bars.  Reduced eccentric control so added pad in seat to elevate height. Parallel bars: Standing hip ext x 10 each, abd x 10 ea, step tap up x 10 each LE, x 10 more with left hand hold only, then attempted right hand hold only but patient unable to complete safely, sidestepping in parallel bars 4 laps.   Gait training: with rolling walker, 350 ft with SBA with VC's for decreased step length on right.   11/30/2021 Seated toe and heel raises right only x 20 Seated LAQ right only x 20 Sit to stand x 5  Parallel bars: Standing hip ext x 10 each,  abd x 10 ea, step tap up x 10 each LE, x 10 more with left hand hold only, then attempted right hand hold only but patient unable to complete safely,  lunges lateral x 10 each LE,   Gait training: with rolling walker, 350 ft with SBA with VC's for decreased step length on right.    11/28/2021 Pt directed in gait training with rolling walker for 320'x3 at [place] with VC for decreased Rt step length to decrease kick out from prosthetic. Pt demonstrated improved gait mechanics though does have decreased cadence with this technique. Pt directed in 2x5 Sit to stand from mat table with bil UE support to ascend from mat, VC for technique and rest break needed between set. Pt also directed in 2x45s Lt hamstring stretches, 2x10 standing marches with walker for support at CGA limited on Rt due to amputation but able to lift Rt hip flexion; standing hip abduction at parallel bars for support 2x10 each. Pt reports his goal is to be safer at home and eventually would like to walk without a walker.       PATIENT EDUCATION:              Educated patient on grip strength exercises.  Suggested  purchasing a small flexible ball and some putty to do these exercises.   Education details: HEP Person educated: Patient Education method: Education officer, environmental, Verbal cues, and Handouts Education comprehension: verbalized understanding, returned demonstration, verbal cues required, tactile cues required, and needs further education  CLINICAL IMPRESSION: Pt is doing well with HEP for strength and is doing well with shortened step length with gait.  Pt did well with activity today and reports Lt LE strain and fatigue with sit to stand.  Pt had 1 episode of instability of the prosthetic limb with standing for Lt LE hip abduction.  Pt fatigues easily and needs to rest for short breaks between exercises. Pt will continue to benefit from skilled PT to address strength, gait and balance    OBJECTIVE IMPAIRMENTS Abnormal gait, cardiopulmonary status limiting activity, decreased activity tolerance, decreased balance, decreased knowledge of use of DME, difficulty walking, decreased strength, impaired flexibility, impaired sensation, postural dysfunction, and prosthetic dependency .    ACTIVITY LIMITATIONS cleaning, community activity, driving, meal prep, laundry, and shopping.    PERSONAL FACTORS Age, Fitness, Past/current experiences, Social background, Time since onset of injury/illness/exacerbation, Transportation, and 1-2 comorbidities: HTN, Diabetes, Hyperlipidemia, chest pain  are also affecting patient's functional outcome.      REHAB POTENTIAL: Good   CLINICAL DECISION MAKING: Evolving/moderate complexity   EVALUATION COMPLEXITY: Moderate     GOALS: Goals reviewed with patient? Yes   SHORT TERM GOALS:   STG Name Target Date Goal status  1 Independence with initial HEP Baseline:  12/20/2021 INITIAL    LONG TERM GOALS:    LTG Name Target Date Goal status  1 Independence with advanced HEP Baseline: 01/17/2022 INITIAL  2 Patient to improve TUG score by 3 sec Baseline: 01/17/2022  INITIAL  3 Patient to improve 5 times sit to stand by 2 sec Baseline: 01/17/2022 INITIAL  4 Patient to report 75% improvement in strength and confidence with walking Baseline: 01/17/2022 INITIAL  5 Patient to be able to come to stand with proper technique avoiding back of LE support on sitting surface Baseline: 01/17/2022 INITIAL    PLAN: PT FREQUENCY: 1-2x/week   PT DURATION: 8 weeks   PLANNED INTERVENTIONS: Therapeutic exercises, Therapeutic activity, Neuro Muscular re-education, Balance training, Gait training,  Patient/Family education, Joint mobilization, Stair training, Prosthetic training, DME instructions, Aquatic Therapy, Cryotherapy, Moist heat, Taping, and Manual therapy   PLAN FOR NEXT SESSION: Review HEP, Nustep, balance training    Sigurd Sos, PT 12/05/21 12:40 PM

## 2021-12-07 ENCOUNTER — Ambulatory Visit: Payer: No Typology Code available for payment source | Attending: Internal Medicine

## 2021-12-07 ENCOUNTER — Other Ambulatory Visit: Payer: Self-pay

## 2021-12-07 DIAGNOSIS — Z9181 History of falling: Secondary | ICD-10-CM | POA: Diagnosis not present

## 2021-12-07 DIAGNOSIS — R2681 Unsteadiness on feet: Secondary | ICD-10-CM | POA: Insufficient documentation

## 2021-12-07 DIAGNOSIS — M6281 Muscle weakness (generalized): Secondary | ICD-10-CM | POA: Insufficient documentation

## 2021-12-07 NOTE — Therapy (Signed)
OUTPATIENT PHYSICAL THERAPY TREATMENT NOTE   Patient Name: Tyler Whitehead MRN: [id number] DOB:01/17/1941, 81 y.o., male Today's Date: 12/07/2021  PCP: Wenda Low, MD REFERRING PROVIDER: Wenda Low, MD      Past Medical History:  Diagnosis Date   CAD (coronary artery disease)    a. S/p 3V CABG 2000 with LIMA-LAD, left radial-PDA, SVG-OM1. b. 2014: stent to SVG-OM1; then found to have obstruction distal to LAD anastamosis of LIMA s/p DES.    CHF (congestive heart failure) ([place])    Diverticular disease    DM (diabetes mellitus) ([place])    Hemorrhoid    HTN (hypertension)    Hyperlipidemia    Peripheral vascular disease ([place])    a. R femoropopliteal bypass ~2012 in [place], reportedly shown to be occluded. Now followed by Dr. [name].   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[place])    RBBB    Past Surgical History:  Procedure Laterality Date   ABDOMINAL ANGIOGRAM N/A 01/18/2015   Procedure: ABDOMINAL ANGIOGRAM;  Surgeon: Serafina Mitchell, MD;  Location: [facility] CATH LAB;  Service: Cardiovascular;  Laterality: N/A;   ABDOMINAL AORTAGRAM N/A 09/21/2014   Procedure: ABDOMINAL Maxcine Ham;  Surgeon: Elam Dutch, MD;  Location: [facility] CATH LAB;  Service: Cardiovascular;  Laterality: N/A;   ABDOMINAL AORTOGRAM W/LOWER EXTREMITY N/A 03/28/2021   Procedure: ABDOMINAL AORTOGRAM W/LOWER EXTREMITY;  Surgeon: Serafina Mitchell, MD;  Location: [place] CV LAB;  Service: Cardiovascular;  Laterality: N/A;   CARDIAC CATHETERIZATION  May 2014   CORONARY ARTERY BYPASS GRAFT  2000   LM occluded, LIMA to LAD patent but distal native LAD disease, saphenous vein bypass graft to OM with 95% stenosis in the midportion, right coronary artery occluded, radial artery graft to right coronary artery patent but was diffusely small. Marginal graft stented. 7 2014. Of note he had multiple previous stents in the right coronary artery. The distal LAD was treated with angioplasty. I   LOOP RECORDER IMPLANT N/A 07/12/2014   Procedure: LOOP  RECORDER IMPLANT;  Surgeon: Evans Lance, MD;  Location: [facility] CATH LAB;  Service: Cardiovascular;  Laterality: N/A;   LOWER EXTREMITY ANGIOGRAM  09/21/2014   Procedure: LOWER EXTREMITY ANGIOGRAM;  Surgeon: Elam Dutch, MD;  Location: [facility] CATH LAB;  Service: Cardiovascular;;   PERCUTANEOUS CORONARY STENT INTERVENTION (PCI-S)  April 27 2013   right leg amputation  2017   TEE WITHOUT CARDIOVERSION N/A 07/12/2014   Procedure: TRANSESOPHAGEAL ECHOCARDIOGRAM (TEE);  Surgeon: Fay Records, MD;  Location: [facility] ENDOSCOPY;  Service: Cardiovascular;  Laterality: N/A;   VEIN SURGERY     Patient Active Problem List   Diagnosis Date Noted   Status post above-knee amputation of right lower extremity ([place]) 09/08/2021   Atherosclerosis of native artery of left lower extremity with rest pain ([place]) 09/08/2021   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 ([place]) 11/14/2020   Allergic rhinitis 11/14/2020   Chronic constipation 11/14/2020   Depression 11/14/2020   Diabetic peripheral neuropathy associated with type 2 diabetes mellitus ([place]) 11/14/2020   Dysphagia 11/14/2020   Hyperlipidemia 11/14/2020   Leukocytosis 11/14/2020   Nausea and vomiting 11/14/2020   Presence of aortocoronary bypass graft 11/14/2020   Pure hypercholesterolemia 11/14/2020   Vitamin D deficiency 11/14/2020   Cryptogenic stroke ([place]) 07/20/2015   History of stroke 07/06/2015   Transient vision disturbance, right 07/06/2015   Atherosclerosis of native arteries of extremity with intermittent claudication ([place]) 09/27/2014   Localization-related symptomatic epilepsy and epileptic syndromes with complex partial seizures, not intractable, without status  epilepticus ([place]) 08/04/2014   Occipital stroke ([place]) 08/04/2014   Cerebral thrombosis with cerebral infarction ([place]) 07/10/2014   New onset seizure ([place]) 07/09/2014   Diabetic hyperosmolar non-ketotic state ([place]) 07/09/2014   Uncontrolled hypertension 07/09/2014   Polycythemia vera  ([place]) 07/09/2014   Seizure ([place]) 07/09/2014   Visual field defect 07/09/2014   Headache 06/30/2014   HTN (hypertension) 11/23/2013   Dyslipidemia 11/23/2013   Myelofibrosis ([place]) 11/22/2013   Diverticular disease 11/22/2013   Hemorrhoids 11/22/2013   S/P CABG x 3 11/22/2013   CAD (coronary artery disease) 11/22/2013   Peripheral vascular disease ([place]) 11/22/2013   DM2 (diabetes mellitus, type 2) ([place]) 11/22/2013   Renal cyst, left 11/22/2013   Splenomegaly 11/22/2013    REFERRING DIAG: R26.9 (ICD-10-CM) - Unspecified abnormalities of gait and mobility  THERAPY DIAG:  No diagnosis found.  PERTINENT HISTORY: Diabetes, HTN, Hyperlipidemia, hip level amputee on right  PRECAUTIONS: fall risk, Rt hip level amputee, walker use  SUBJECTIVE: Patient states he is doing well.  He is working with his elastic band at home and doing all of his exercises.      PAIN:  Are you having pain? No  OBJECTIVE: from eval 11/22/21   DIAGNOSTIC FINDINGS: na   COGNITION:          Overall cognitive status: Within functional limits for tasks assessed                  SENSATION:          Impaired due to diabetes.   POSTURE:  Rounded shoulders / fwd head PALPATION: Palpable pitting edema on left LE  LE AROM/PROM:   Amputee right at hip level,  left LE WFL   LE MMT:   Amputee on right, Left generally 4/5   FUNCTIONAL TESTS:  5 times sit to stand: 23.98 Timed up and go (TUG): 19.79   GAIT: Distance walked: 50 Assistive device utilized: Walker - 2 wheeled Level of assistance: Complete Independence Comments: good step length   TODAY'S TREATMENT:  Treatment:  12/07/2021 NuStep: Level 7 x 8  minutes (400 steps)- pt took a rest breat at 3 min due to shortness of breath. Seated toe and heel raises Lt only x 20 Seated LAQ Lt only x 20 added 2 lb ankle weight Seated hamstring curl with red band 2 x 10 Seated hip and knee extension with red band 2 x 10  Sit to stand  x10 UEs on parallel  bars.  Reduced eccentric control so added pad in seat to elevate height. Sit to stand  x5 with right UE , then with left UE continued focus on eccentric control.    12/05/2021 NuStep: Level 6 x 7  minutes (400 steps)- pt took a rest breat at 3 min due to shortness of breath. Seated toe and heel raises Lt only x 20 Seated LAQ Lt only x 20 Sit to stand  2 x 10 UEs on parallel bars.  Reduced eccentric control so added pad in seat to elevate height. Parallel bars: Standing hip ext x 10 each, abd x 10 ea, step tap up x 10 each LE, x 10 more with left hand hold only, then attempted right hand hold only but patient unable to complete safely, sidestepping in parallel bars 4 laps.   Gait training: with rolling walker, 350 ft with SBA with VC's for decreased step length on right.   11/30/2021 Seated toe and heel raises right only x 20 Seated LAQ right only x  20 Sit to stand x 5  Parallel bars: Standing hip ext x 10 each, abd x 10 ea, step tap up x 10 each LE, x 10 more with left hand hold only, then attempted right hand hold only but patient unable to complete safely,  lunges lateral x 10 each LE,   Gait training: with rolling walker, 350 ft with SBA with VC's for decreased step length on right.    11/28/2021 Pt directed in gait training with rolling walker for 320'x3 at CGA with VC for decreased Rt step length to decrease kick out from prosthetic. Pt demonstrated improved gait mechanics though does have decreased cadence with this technique. Pt directed in 2x5 Sit to stand from mat table with bil UE support to ascend from mat, VC for technique and rest break needed between set. Pt also directed in 2x45s Lt hamstring stretches, 2x10 standing marches with walker for support at CGA limited on Rt due to amputation but able to lift Rt hip flexion; standing hip abduction at parallel bars for support 2x10 each. Pt reports his goal is to be safer at home and eventually would like to walk without a walker.        PATIENT EDUCATION:              Educated patient on grip strength exercises.  Suggested purchasing a small flexible ball and some putty to do these exercises.   Education details: HEP Person educated: Patient Education method: Education officer, environmental, Verbal cues, and Handouts Education comprehension: verbalized understanding, returned demonstration, verbal cues required, tactile cues required, and needs further education  CLINICAL IMPRESSION: Pt is doing well with HEP for strength.  He is well motivated and compliant.  He needs heavy verbal cues for controlling stand to sit eccentrically but was able to do this with hands on chair x 5 correctly and safely with instruction.  He would benefit from continued skilled PT for LE and functional strengthening along with balance training.    OBJECTIVE IMPAIRMENTS Abnormal gait, cardiopulmonary status limiting activity, decreased activity tolerance, decreased balance, decreased knowledge of use of DME, difficulty walking, decreased strength, impaired flexibility, impaired sensation, postural dysfunction, and prosthetic dependency .    ACTIVITY LIMITATIONS cleaning, community activity, driving, meal prep, laundry, and shopping.    PERSONAL FACTORS Age, Fitness, Past/current experiences, Social background, Time since onset of injury/illness/exacerbation, Transportation, and 1-2 comorbidities: HTN, Diabetes, Hyperlipidemia, chest pain  are also affecting patient's functional outcome.      REHAB POTENTIAL: Good   CLINICAL DECISION MAKING: Evolving/moderate complexity   EVALUATION COMPLEXITY: Moderate     GOALS: Goals reviewed with patient? Yes   SHORT TERM GOALS:   STG Name Target Date Goal status  1 Independence with initial HEP Baseline:  12/20/2021 INITIAL    LONG TERM GOALS:    LTG Name Target Date Goal status  1 Independence with advanced HEP Baseline: 01/17/2022 INITIAL  2 Patient to improve TUG score by 3 sec Baseline: 01/17/2022  INITIAL  3 Patient to improve 5 times sit to stand by 2 sec Baseline: 01/17/2022 INITIAL  4 Patient to report 75% improvement in strength and confidence with walking Baseline: 01/17/2022 INITIAL  5 Patient to be able to come to stand with proper technique avoiding back of LE support on sitting surface Baseline: 01/17/2022 INITIAL    PLAN: PT FREQUENCY: 1-2x/week   PT DURATION: 8 weeks   PLANNED INTERVENTIONS: Therapeutic exercises, Therapeutic activity, Neuro Muscular re-education, Balance training, Gait training, Patient/Family education, Joint mobilization, Stair  training, Prosthetic training, DME instructions, Aquatic Therapy, Cryotherapy, Moist heat, Taping, and Manual therapy   PLAN FOR NEXT SESSION: Review HEP, Nustep, progress balance training and functional strengthening as tolerated.     Anderson Malta B. Kijuana Ruppel, PT 12/08/2310:59 PM

## 2021-12-13 ENCOUNTER — Ambulatory Visit: Payer: No Typology Code available for payment source

## 2021-12-13 ENCOUNTER — Other Ambulatory Visit: Payer: Self-pay

## 2021-12-13 DIAGNOSIS — M6281 Muscle weakness (generalized): Secondary | ICD-10-CM | POA: Diagnosis not present

## 2021-12-13 DIAGNOSIS — R2681 Unsteadiness on feet: Secondary | ICD-10-CM

## 2021-12-13 DIAGNOSIS — Z9181 History of falling: Secondary | ICD-10-CM

## 2021-12-13 NOTE — Therapy (Signed)
OUTPATIENT PHYSICAL THERAPY TREATMENT NOTE   Patient Name: Tyler Whitehead MRN: [id number] DOB:1940-11-27, 81 y.o., male Today's Date: 12/13/2021  PCP: Wenda Low, MD REFERRING PROVIDER: Wenda Low, MD   PT End of Session - 12/13/21 1231     Visit Number 6    Date for PT Re-Evaluation 01/17/22    Authorization Type [employer]    Progress Note Due on Visit 10    PT Start Time 1151    PT Stop Time 1229    PT Time Calculation (min) 38 min    Activity Tolerance Patient tolerated treatment well    Behavior During Therapy WFL for tasks assessed/performed               Past Medical History:  Diagnosis Date   CAD (coronary artery disease)    a. S/p 3V CABG 2000 with LIMA-LAD, left radial-PDA, SVG-OM1. b. 2014: stent to SVG-OM1; then found to have obstruction distal to LAD anastamosis of LIMA s/p DES.    CHF (congestive heart failure) ([place])    Diverticular disease    DM (diabetes mellitus) ([place])    Hemorrhoid    HTN (hypertension)    Hyperlipidemia    Peripheral vascular disease ([place])    a. R femoropopliteal bypass ~2012 in [place], reportedly shown to be occluded. Now followed by Dr. [name].   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[place])    RBBB    Past Surgical History:  Procedure Laterality Date   ABDOMINAL ANGIOGRAM N/A 01/18/2015   Procedure: ABDOMINAL ANGIOGRAM;  Surgeon: Serafina Mitchell, MD;  Location: [facility] CATH LAB;  Service: Cardiovascular;  Laterality: N/A;   ABDOMINAL AORTAGRAM N/A 09/21/2014   Procedure: ABDOMINAL Maxcine Ham;  Surgeon: Elam Dutch, MD;  Location: [facility] CATH LAB;  Service: Cardiovascular;  Laterality: N/A;   ABDOMINAL AORTOGRAM W/LOWER EXTREMITY N/A 03/28/2021   Procedure: ABDOMINAL AORTOGRAM W/LOWER EXTREMITY;  Surgeon: Serafina Mitchell, MD;  Location: [place] CV LAB;  Service: Cardiovascular;  Laterality: N/A;   CARDIAC CATHETERIZATION  May 2014   CORONARY ARTERY BYPASS GRAFT  2000   LM occluded, LIMA to LAD patent but distal native LAD disease,  saphenous vein bypass graft to OM with 95% stenosis in the midportion, right coronary artery occluded, radial artery graft to right coronary artery patent but was diffusely small. Marginal graft stented. 7 2014. Of note he had multiple previous stents in the right coronary artery. The distal LAD was treated with angioplasty. I   LOOP RECORDER IMPLANT N/A 07/12/2014   Procedure: LOOP RECORDER IMPLANT;  Surgeon: Evans Lance, MD;  Location: [facility] CATH LAB;  Service: Cardiovascular;  Laterality: N/A;   LOWER EXTREMITY ANGIOGRAM  09/21/2014   Procedure: LOWER EXTREMITY ANGIOGRAM;  Surgeon: Elam Dutch, MD;  Location: [facility] CATH LAB;  Service: Cardiovascular;;   PERCUTANEOUS CORONARY STENT INTERVENTION (PCI-S)  April 27 2013   right leg amputation  2017   TEE WITHOUT CARDIOVERSION N/A 07/12/2014   Procedure: TRANSESOPHAGEAL ECHOCARDIOGRAM (TEE);  Surgeon: Fay Records, MD;  Location: [facility] ENDOSCOPY;  Service: Cardiovascular;  Laterality: N/A;   VEIN SURGERY     Patient Active Problem List   Diagnosis Date Noted   Status post above-knee amputation of right lower extremity ([place]) 09/08/2021   Atherosclerosis of native artery of left lower extremity with rest pain ([place]) 09/08/2021   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 ([place]) 11/14/2020   Allergic rhinitis 11/14/2020   Chronic constipation 11/14/2020   Depression 11/14/2020   Diabetic peripheral neuropathy associated with  type 2 diabetes mellitus ([place]) 11/14/2020   Dysphagia 11/14/2020   Hyperlipidemia 11/14/2020   Leukocytosis 11/14/2020   Nausea and vomiting 11/14/2020   Presence of aortocoronary bypass graft 11/14/2020   Pure hypercholesterolemia 11/14/2020   Vitamin D deficiency 11/14/2020   Cryptogenic stroke ([place]) 07/20/2015   History of stroke 07/06/2015   Transient vision disturbance, right 07/06/2015   Atherosclerosis of native arteries of extremity with intermittent claudication ([place]) 09/27/2014   Localization-related symptomatic  epilepsy and epileptic syndromes with complex partial seizures, not intractable, without status epilepticus ([place]) 08/04/2014   Occipital stroke ([place]) 08/04/2014   Cerebral thrombosis with cerebral infarction ([place]) 07/10/2014   New onset seizure ([place]) 07/09/2014   Diabetic hyperosmolar non-ketotic state ([place]) 07/09/2014   Uncontrolled hypertension 07/09/2014   Polycythemia vera ([place]) 07/09/2014   Seizure ([place]) 07/09/2014   Visual field defect 07/09/2014   Headache 06/30/2014   HTN (hypertension) 11/23/2013   Dyslipidemia 11/23/2013   Myelofibrosis ([place]) 11/22/2013   Diverticular disease 11/22/2013   Hemorrhoids 11/22/2013   S/P CABG x 3 11/22/2013   CAD (coronary artery disease) 11/22/2013   Peripheral vascular disease ([place]) 11/22/2013   DM2 (diabetes mellitus, type 2) ([place]) 11/22/2013   Renal cyst, left 11/22/2013   Splenomegaly 11/22/2013    REFERRING DIAG: R26.9 (ICD-10-CM) - Unspecified abnormalities of gait and mobility  THERAPY DIAG:  Muscle weakness (generalized)  Unsteadiness on feet  History of falling  PERTINENT HISTORY: Diabetes, HTN, Hyperlipidemia, hip level amputee on right  PRECAUTIONS: fall risk, Rt hip level amputee, walker use  SUBJECTIVE: I woke up sweating in the middle of the night on Saturday.  I am not sure what was going on.  I didn't call anybody because my building is locked and the wouldn't be able to get to me.  I am not having any symptoms now.      PAIN:  Are you having pain? No  OBJECTIVE: from eval 11/22/21   DIAGNOSTIC FINDINGS: na   COGNITION:          Overall cognitive status: Within functional limits for tasks assessed                  SENSATION:          Impaired due to diabetes.   POSTURE:  Rounded shoulders / fwd head PALPATION: Palpable pitting edema on left LE  LE AROM/PROM:   Amputee right at hip level,  left LE WFL   LE MMT:   Amputee on right, Left generally 4/5   FUNCTIONAL TESTS:  5 times sit to stand:  23.98 Timed up and go (TUG): 19.79   GAIT: Distance walked: 50 Assistive device utilized: Walker - 2 wheeled Level of assistance: Complete Independence Comments: good step length   TODAY'S TREATMENT:  Treatment: 12/13/2021 NuStep: Level 7 x 8  minutes (400 steps)- multiple rest breaks taken due to shortness of breath.  HR 85, O2 99%  Seated toe and heel raises Lt only x 20 Seated LAQ Lt only x 20 added 3 lb ankle weight Seated hamstring curl with red band 2 x 10 Seated marching: red band 2x10 bil each Hip abduction: seated with band around thighs Sit to stand  x10  with 1 UE on parallel bars- improved eccentric control today.  GAIT: Distance walked: 50 Assistive device utilized: Walker - 2 wheeled Level of assistance: Complete Independence Comments: good step length 12/07/2021 NuStep: Level 7 x 8  minutes (400 steps)- pt took a rest breat at 3 min due  to shortness of breath. Seated toe and heel raises Lt only x 20 Seated LAQ Lt only x 20 added 2 lb ankle weight Seated hamstring curl with red band 2 x 10 Seated hip and knee extension with red band 2 x 10  Sit to stand  x10 UEs on parallel bars.  Reduced eccentric control so added pad in seat to elevate height. Sit to stand  x5 with right UE , then with left UE continued focus on eccentric control.    12/05/2021 NuStep: Level 6 x 7  minutes (400 steps)- pt took a rest breat at 3 min due to shortness of breath. Seated toe and heel raises Lt only x 20 Seated LAQ Lt only x 20 Sit to stand  2 x 10 UEs on parallel bars.  Reduced eccentric control so added pad in seat to elevate height. Parallel bars: Standing hip ext x 10 each, abd x 10 ea, step tap up x 10 each LE, x 10 more with left hand hold only, then attempted right hand hold only but patient unable to complete safely, sidestepping in parallel bars 4 laps.   Gait training: with rolling walker, 350 ft with SBA with VC's for decreased step length on right.    PATIENT EDUCATION:               Educated patient on grip strength exercises.  Suggested purchasing a small flexible ball and some putty to do these exercises.   Education details: HEP Person educated: Patient Education method: Education officer, environmental, Verbal cues, and Handouts Education comprehension: verbalized understanding, returned demonstration, verbal cues required, tactile cues required, and needs further education  CLINICAL IMPRESSION: Pt is doing well with HEP for strength.  Pt reports that he will see his heart MD due to having an episode of waking up sweating in the night over the weekend.  PT monitored his HR and 02 while on NuStep.  He needs moderate verbal cues for controlling stand to sit eccentrically but was able to do this with hands on chair x 5 correctly and safely with instruction. Pt requires close CGA due to instability of prosthetic limb at times in standing.   He would benefit from continued skilled PT for LE and functional strengthening along with balance training.    OBJECTIVE IMPAIRMENTS Abnormal gait, cardiopulmonary status limiting activity, decreased activity tolerance, decreased balance, decreased knowledge of use of DME, difficulty walking, decreased strength, impaired flexibility, impaired sensation, postural dysfunction, and prosthetic dependency .    ACTIVITY LIMITATIONS cleaning, community activity, driving, meal prep, laundry, and shopping.    PERSONAL FACTORS Age, Fitness, Past/current experiences, Social background, Time since onset of injury/illness/exacerbation, Transportation, and 1-2 comorbidities: HTN, Diabetes, Hyperlipidemia, chest pain  are also affecting patient's functional outcome.      GOALS: Goals reviewed with patient? Yes   SHORT TERM GOALS:   STG Name Target Date Goal status  1 Independence with initial HEP Baseline:  12/20/2021 MET    LONG TERM GOALS:    LTG Name Target Date Goal status  1 Independence with advanced HEP Baseline: 01/17/2022 INITIAL  2  Patient to improve TUG score by 3 sec Baseline: 01/17/2022 INITIAL  3 Patient to improve 5 times sit to stand by 2 sec Baseline: 01/17/2022 INITIAL  4 Patient to report 75% improvement in strength and confidence with walking Baseline: 01/17/2022 INITIAL  5 Patient to be able to come to stand with proper technique avoiding back of LE support on sitting surface Baseline: 01/17/2022 INITIAL  PLAN: PT FREQUENCY: 1-2x/week   PT DURATION: 8 weeks   PLANNED INTERVENTIONS: Therapeutic exercises, Therapeutic activity, Neuro Muscular re-education, Balance training, Gait training, Patient/Family education, Joint mobilization, Stair training, Prosthetic training, DME instructions, Aquatic Therapy, Cryotherapy, Moist heat, Taping, and Manual therapy   PLAN FOR NEXT SESSION: Nustep, progress balance training and functional strengthening as tolerated.      Sigurd Sos, PT 12/13/21 12:32 PM

## 2021-12-14 ENCOUNTER — Inpatient Hospital Stay: Payer: No Typology Code available for payment source | Attending: Nurse Practitioner

## 2021-12-14 DIAGNOSIS — D471 Chronic myeloproliferative disease: Secondary | ICD-10-CM | POA: Insufficient documentation

## 2021-12-14 DIAGNOSIS — D7581 Myelofibrosis: Secondary | ICD-10-CM

## 2021-12-14 LAB — CMP (CANCER CENTER ONLY)
ALT: 9 U/L (ref 0–44)
AST: 12 U/L — ABNORMAL LOW (ref 15–41)
Albumin: 3.9 g/dL (ref 3.5–5.0)
Alkaline Phosphatase: 85 U/L (ref 38–126)
Anion gap: 5 (ref 5–15)
BUN: 17 mg/dL (ref 8–23)
CO2: 27 mmol/L (ref 22–32)
Calcium: 8.9 mg/dL (ref 8.9–10.3)
Chloride: 104 mmol/L (ref 98–111)
Creatinine: 1.05 mg/dL (ref 0.61–1.24)
GFR, Estimated: >60 mL/min (ref >60)
Glucose, Bld: 281 mg/dL — ABNORMAL HIGH (ref 70–99)
Potassium: 4.7 mmol/L (ref 3.5–5.1)
Sodium: 136 mmol/L (ref 135–145)
Total Bilirubin: 0.5 mg/dL (ref 0.3–1.2)
Total Protein: 6.2 g/dL — ABNORMAL LOW (ref 6.5–8.1)

## 2021-12-14 LAB — CBC WITH DIFFERENTIAL/PLATELET
Abs Immature Granulocytes: 0.12 10*3/uL — ABNORMAL HIGH (ref 0.00–0.07)
Basophils Absolute: 0.1 10*3/uL (ref 0.0–0.1)
Basophils Relative: 1 %
Eosinophils Absolute: 0.4 10*3/uL (ref 0.0–0.5)
Eosinophils Relative: 3 %
HCT: 32.6 % — ABNORMAL LOW (ref 39.0–52.0)
Hemoglobin: 11 g/dL — ABNORMAL LOW (ref 13.0–17.0)
Immature Granulocytes: 1 %
Lymphocytes Relative: 6 %
Lymphs Abs: 0.8 10*3/uL (ref 0.7–4.0)
MCH: 36.5 pg — ABNORMAL HIGH (ref 26.0–34.0)
MCHC: 33.7 g/dL (ref 30.0–36.0)
MCV: 108.3 fL — ABNORMAL HIGH (ref 80.0–100.0)
Monocytes Absolute: 0.7 10*3/uL (ref 0.1–1.0)
Monocytes Relative: 5 %
Neutro Abs: 12.3 10*3/uL — ABNORMAL HIGH (ref 1.7–7.7)
Neutrophils Relative %: 84 %
Platelets: 540 10*3/uL — ABNORMAL HIGH (ref 150–400)
RBC: 3.01 MIL/uL — ABNORMAL LOW (ref 4.22–5.81)
RDW: 19.7 % — ABNORMAL HIGH (ref 11.5–15.5)
WBC: 14.4 10*3/uL — ABNORMAL HIGH (ref 4.0–10.5)
nRBC: 0 % (ref 0.0–0.2)

## 2021-12-19 ENCOUNTER — Ambulatory Visit: Payer: No Typology Code available for payment source

## 2021-12-19 ENCOUNTER — Other Ambulatory Visit: Payer: Self-pay

## 2021-12-19 DIAGNOSIS — Z9181 History of falling: Secondary | ICD-10-CM

## 2021-12-19 DIAGNOSIS — M6281 Muscle weakness (generalized): Secondary | ICD-10-CM

## 2021-12-19 DIAGNOSIS — R2681 Unsteadiness on feet: Secondary | ICD-10-CM

## 2021-12-19 NOTE — Therapy (Signed)
?OUTPATIENT PHYSICAL THERAPY TREATMENT NOTE ? ? ?Patient Name: Tyler Whitehead ?MRN: [id number] ?DOB:[birth year], 81 y.o., male ?Today's Date: 12/19/2021 ? ?PCP: Wenda Low, MD ?REFERRING PROVIDER: Wenda Low, MD ? ? PT End of Session - 12/19/21 1102   ? ? Visit Number 7   ? Date for PT Re-Evaluation 01/17/22   ? Authorization Type [employer]   ? Authorization - Visit Number 6   ? Progress Note Due on Visit 10   ? PT Start Time 1017   ? PT Stop Time 1102   ? PT Time Calculation (min) 45 min   ? Activity Tolerance Patient tolerated treatment well   ? Behavior During Therapy [facility] for tasks assessed/performed   ? ?  ?  ? ?  ? ? ? ? ? ?Past Medical History:  ?Diagnosis Date  ? CAD (coronary artery disease)   ? a. S/p 3V CABG 2000 with LIMA-LAD, left radial-PDA, SVG-OM1. b. 2014: stent to SVG-OM1; then found to have obstruction distal to LAD anastamosis of LIMA s/p DES.   ? CHF (congestive heart failure) ([place])   ? Diverticular disease   ? DM (diabetes mellitus) ([place])   ? Hemorrhoid   ? HTN (hypertension)   ? Hyperlipidemia   ? Peripheral vascular disease ([place])   ? a. R femoropopliteal bypass ~2012 in [place], reportedly shown to be occluded. Now followed by Dr. [name].  ? 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[place])   ? RBBB   ? ?Past Surgical History:  ?Procedure Laterality Date  ? ABDOMINAL ANGIOGRAM N/A 01/18/2015  ? Procedure: ABDOMINAL ANGIOGRAM;  Surgeon: Serafina Mitchell, MD;  Location: [facility] CATH LAB;  Service: Cardiovascular;  Laterality: N/A;  ? ABDOMINAL AORTAGRAM N/A 09/21/2014  ? Procedure: ABDOMINAL AORTAGRAM;  Surgeon: Elam Dutch, MD;  Location: [facility] CATH LAB;  Service: Cardiovascular;  Laterality: N/A;  ? ABDOMINAL AORTOGRAM W/LOWER EXTREMITY N/A 03/28/2021  ? Procedure: ABDOMINAL AORTOGRAM W/LOWER EXTREMITY;  Surgeon: Serafina Mitchell, MD;  Location: [place] CV LAB;  Service: Cardiovascular;  Laterality: N/A;  ? CARDIAC CATHETERIZATION  May 2014  ? CORONARY ARTERY BYPASS GRAFT  2000  ? LM occluded, LIMA to LAD patent  but distal native LAD disease, saphenous vein bypass graft to OM with 95% stenosis in the midportion, right coronary artery occluded, radial artery graft to right coronary artery patent but was diffusely small. Marginal graft stented. 7 2014. Of note he had multiple previous stents in the right coronary artery. The distal LAD was treated with angioplasty. I  ? LOOP RECORDER IMPLANT N/A 07/12/2014  ? Procedure: LOOP RECORDER IMPLANT;  Surgeon: Evans Lance, MD;  Location: [facility] CATH LAB;  Service: Cardiovascular;  Laterality: N/A;  ? LOWER EXTREMITY ANGIOGRAM  09/21/2014  ? Procedure: LOWER EXTREMITY ANGIOGRAM;  Surgeon: Elam Dutch, MD;  Location: [facility] CATH LAB;  Service: Cardiovascular;;  ? PERCUTANEOUS CORONARY STENT INTERVENTION (PCI-S)  April 27 2013  ? right leg amputation  2017  ? TEE WITHOUT CARDIOVERSION N/A 07/12/2014  ? Procedure: TRANSESOPHAGEAL ECHOCARDIOGRAM (TEE);  Surgeon: Fay Records, MD;  Location: [place];  Service: Cardiovascular;  Laterality: N/A;  ? VEIN SURGERY    ? ?Patient Active Problem List  ? Diagnosis Date Noted  ? Status post above-knee amputation of right lower extremity ([place]) 09/08/2021  ? Atherosclerosis of native artery of left lower extremity with rest pain ([place]) 09/08/2021  ? 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 ([place]) 11/14/2020  ? Allergic rhinitis 11/14/2020  ? Chronic constipation 11/14/2020  ?  Depression 11/14/2020  ? Diabetic peripheral neuropathy associated with type 2 diabetes mellitus ([place]) 11/14/2020  ? Dysphagia 11/14/2020  ? Hyperlipidemia 11/14/2020  ? Leukocytosis 11/14/2020  ? Nausea and vomiting 11/14/2020  ? Presence of aortocoronary bypass graft 11/14/2020  ? Pure hypercholesterolemia 11/14/2020  ? Vitamin D deficiency 11/14/2020  ? Cryptogenic stroke ([place]) 07/20/2015  ? History of stroke 07/06/2015  ? Transient vision disturbance, right 07/06/2015  ? Atherosclerosis of native arteries of extremity with intermittent claudication ([place]) 09/27/2014  ?  Localization-related symptomatic epilepsy and epileptic syndromes with complex partial seizures, not intractable, without status epilepticus ([place]) 08/04/2014  ? Occipital stroke ([place]) 08/04/2014  ? Cerebral thrombosis with cerebral infarction ([place]) 07/10/2014  ? New onset seizure ([place]) 07/09/2014  ? Diabetic hyperosmolar non-ketotic state ([place]) 07/09/2014  ? Uncontrolled hypertension 07/09/2014  ? Polycythemia vera ([place]) 07/09/2014  ? Seizure ([place]) 07/09/2014  ? Visual field defect 07/09/2014  ? Headache 06/30/2014  ? HTN (hypertension) 11/23/2013  ? Dyslipidemia 11/23/2013  ? Myelofibrosis ([place]) 11/22/2013  ? Diverticular disease 11/22/2013  ? Hemorrhoids 11/22/2013  ? S/P CABG x 3 11/22/2013  ? CAD (coronary artery disease) 11/22/2013  ? Peripheral vascular disease ([place]) 11/22/2013  ? DM2 (diabetes mellitus, type 2) ([place]) 11/22/2013  ? Renal cyst, left 11/22/2013  ? Splenomegaly 11/22/2013  ? ? ?REFERRING DIAG: R26.9 (ICD-10-CM) - Unspecified abnormalities of gait and mobility ? ?THERAPY DIAG:  ?Unsteadiness on feet ? ?Muscle weakness (generalized) ? ?History of falling ? ?PERTINENT HISTORY: Diabetes, HTN, Hyperlipidemia, hip level amputee on right ? ?PRECAUTIONS: fall risk, Rt hip level amputee, walker use ? ?SUBJECTIVE: No cardiac symptoms since last session.  I'm wearing my ankle weights and I did my leg lifts today.   ? ?PAIN:  ?Are you having pain? No ? ?OBJECTIVE:  ? eval 11/22/21 ?LE MMT: ?  ?Amputee on right, Left generally 4/5 ?  ?FUNCTIONAL TESTS:  ?5 times sit to stand: 23.98 ?Timed up and go (TUG): 19.79 ?  ?GAIT: ?Distance walked: 50 ?Assistive device utilized: Environmental consultant - 2 wheeled ?Level of assistance: Complete Independence ?Comments: good step length ?12/19/21:  ?TUG: 22 seconds  ?5X sit to stand: 19.91 seconds-UE support required  ? ? TODAY'S TREATMENT:  ?Treatment: ?12/19/2021 ?NuStep: Level 5 x 8  minutes (511 steps)- PT monitored and had pt pace himself to reduce rest breaks (2 short rest breaks  taken) ?Standing hip abduction and extension: 3# on Lt LE: 2x10 bil each- close CGA for safety ?Seated LAQ Lt only x 20 added 3 lb ankle weight ?Seated hamstring curl with red band 2 x 10 ?Seated marching: 3# on Lt ankle 2x10 ?Sit to stand  x10  with 1 UE on parallel bars- improved eccentric control today.  ?GAIT: ?Distance walked: 50 ?Assistive device utilized: Environmental consultant - 2 wheeled ?Level of assistance: Complete Independence ?Comments: good step length ?12/13/2021 ?NuStep: Level 7 x 8  minutes (400 steps)- multiple rest breaks taken due to shortness of breath.  HR 85, O2 99%  ?Seated toe and heel raises Lt only x 20 ?Seated LAQ Lt only x 20 added 3 lb ankle weight ?Seated hamstring curl with red band 2 x 10 ?Seated marching: red band 2x10 bil each ?Hip abduction: seated with band around thighs ?Sit to stand  x10  with 1 UE on parallel bars- improved eccentric control today.  ?GAIT: ?Distance walked: From cancer rehab gym around ortho gym and back ?Assistive device utilized: Environmental consultant - 2 wheeled ?Level of assistance: Complete Independence ?Comments: good step length ?  12/07/2021 ?NuStep: Level 7 x 8  minutes (400 steps)- pt took a rest breat at 3 min due to shortness of breath. ?Seated toe and heel raises Lt only x 20 ?Seated LAQ Lt only x 20 added 2 lb ankle weight ?Seated hamstring curl with red band 2 x 10 ?Seated hip and knee extension with red band 2 x 10  ?Sit to stand  x10 UEs on parallel bars.  Reduced eccentric control so added pad in seat to elevate height. ?Sit to stand  x5 with right UE , then with left UE continued focus on eccentric control.   ?  ?PATIENT EDUCATION:  ?            Educated patient on grip strength exercises.  Suggested purchasing a small flexible ball and some putty to do these exercises.   ?Education details: HEP ?Person educated: Patient ?Education method: Explanation, Demonstration, Verbal cues, and Handouts ?Education comprehension: verbalized understanding, returned demonstration, verbal  cues required, tactile cues required, and needs further education ? ?CLINICAL IMPRESSION: ?Pt is doing well with HEP for strength.  No change in TUG today although 5x sit to stand is improved today. He needs

## 2021-12-21 ENCOUNTER — Other Ambulatory Visit: Payer: Self-pay

## 2021-12-21 ENCOUNTER — Ambulatory Visit: Payer: No Typology Code available for payment source

## 2021-12-21 VITALS — BP 147/70 | HR 84

## 2021-12-21 DIAGNOSIS — M6281 Muscle weakness (generalized): Secondary | ICD-10-CM | POA: Diagnosis not present

## 2021-12-21 NOTE — Therapy (Signed)
?OUTPATIENT PHYSICAL THERAPY TREATMENT NOTE ? ? ?Patient Name: Tyler Whitehead ?MRN: [id number] ?DOB:[birth year], 81 y.o., male ?Today's Date: 12/21/2021 ? ?PCP: Wenda Low, MD ?REFERRING PROVIDER: Wenda Low, MD ? ? PT End of Session - 12/21/21 5277   ? ? Visit Number 8   ? Date for PT Re-Evaluation 01/17/22   ? Authorization Type [employer]   ? Authorization - Visit Number 7   ? Progress Note Due on Visit 10   ? PT Start Time [phone]   ? PT Stop Time 1050   ? PT Time Calculation (min) 58 min   ? Activity Tolerance Patient tolerated treatment well   ? Behavior During Therapy [facility] for tasks assessed/performed   ? ?  ?  ? ?  ? ? ? ? ? ?Past Medical History:  ?Diagnosis Date  ? CAD (coronary artery disease)   ? a. S/p 3V CABG 2000 with LIMA-LAD, left radial-PDA, SVG-OM1. b. 2014: stent to SVG-OM1; then found to have obstruction distal to LAD anastamosis of LIMA s/p DES.   ? CHF (congestive heart failure) ([place])   ? Diverticular disease   ? DM (diabetes mellitus) ([place])   ? Hemorrhoid   ? HTN (hypertension)   ? Hyperlipidemia   ? Peripheral vascular disease ([place])   ? a. R femoropopliteal bypass ~2012 in [place], reportedly shown to be occluded. Now followed by Dr. [name].  ? 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[place])   ? RBBB   ? ?Past Surgical History:  ?Procedure Laterality Date  ? ABDOMINAL ANGIOGRAM N/A 01/18/2015  ? Procedure: ABDOMINAL ANGIOGRAM;  Surgeon: Serafina Mitchell, MD;  Location: [facility] CATH LAB;  Service: Cardiovascular;  Laterality: N/A;  ? ABDOMINAL AORTAGRAM N/A 09/21/2014  ? Procedure: ABDOMINAL AORTAGRAM;  Surgeon: Elam Dutch, MD;  Location: [facility] CATH LAB;  Service: Cardiovascular;  Laterality: N/A;  ? ABDOMINAL AORTOGRAM W/LOWER EXTREMITY N/A 03/28/2021  ? Procedure: ABDOMINAL AORTOGRAM W/LOWER EXTREMITY;  Surgeon: Serafina Mitchell, MD;  Location: [place] CV LAB;  Service: Cardiovascular;  Laterality: N/A;  ? CARDIAC CATHETERIZATION  May 2014  ? CORONARY ARTERY BYPASS GRAFT  2000  ? LM occluded, LIMA to LAD patent  but distal native LAD disease, saphenous vein bypass graft to OM with 95% stenosis in the midportion, right coronary artery occluded, radial artery graft to right coronary artery patent but was diffusely small. Marginal graft stented. 7 2014. Of note he had multiple previous stents in the right coronary artery. The distal LAD was treated with angioplasty. I  ? LOOP RECORDER IMPLANT N/A 07/12/2014  ? Procedure: LOOP RECORDER IMPLANT;  Surgeon: Evans Lance, MD;  Location: [facility] CATH LAB;  Service: Cardiovascular;  Laterality: N/A;  ? LOWER EXTREMITY ANGIOGRAM  09/21/2014  ? Procedure: LOWER EXTREMITY ANGIOGRAM;  Surgeon: Elam Dutch, MD;  Location: [facility] CATH LAB;  Service: Cardiovascular;;  ? PERCUTANEOUS CORONARY STENT INTERVENTION (PCI-S)  April 27 2013  ? right leg amputation  2017  ? TEE WITHOUT CARDIOVERSION N/A 07/12/2014  ? Procedure: TRANSESOPHAGEAL ECHOCARDIOGRAM (TEE);  Surgeon: Fay Records, MD;  Location: [place];  Service: Cardiovascular;  Laterality: N/A;  ? VEIN SURGERY    ? ?Patient Active Problem List  ? Diagnosis Date Noted  ? Status post above-knee amputation of right lower extremity ([place]) 09/08/2021  ? Atherosclerosis of native artery of left lower extremity with rest pain ([place]) 09/08/2021  ? 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 ([place]) 11/14/2020  ? Allergic rhinitis 11/14/2020  ? Chronic constipation 11/14/2020  ?  Depression 11/14/2020  ? Diabetic peripheral neuropathy associated with type 2 diabetes mellitus ([place]) 11/14/2020  ? Dysphagia 11/14/2020  ? Hyperlipidemia 11/14/2020  ? Leukocytosis 11/14/2020  ? Nausea and vomiting 11/14/2020  ? Presence of aortocoronary bypass graft 11/14/2020  ? Pure hypercholesterolemia 11/14/2020  ? Vitamin D deficiency 11/14/2020  ? 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 History of stroke 07/06/2015  ? Transient vision disturbance, right 07/06/2015  ? Atherosclerosis of native arteries of extremity with intermittent claudication ([place]) 09/27/2014  ?  Localization-related symptomatic epilepsy and epileptic syndromes with complex partial seizures, not intractable, without status epilepticus ([place]) 08/04/2014  ? Occipital stroke ([place]) 08/04/2014  ? Cerebral thrombosis with cerebral infarction ([place] 07/10/2014  ? New onset seizure ([place]) 07/09/2014  ? Diabetic hyperosmolar non-ketotic state ([place]) 07/09/2014  ? Uncontrolled hypertension 07/09/2014  ? Polycythemia vera ([place]) 07/09/2014  ? Seizure ([place]) 07/09/2014  ? Visual field defect 07/09/2014  ? Headache 06/30/2014  ? HTN (hypertension) 11/23/2013  ? Dyslipidemia 11/23/2013  ? Myelofibrosis ([place]) 11/22/2013  ? Diverticular disease 11/22/2013  ? Hemorrhoids 11/22/2013  ? S/P CABG x 3 11/22/2013  ? CAD (coronary artery disease) 11/22/2013  ? Peripheral vascular disease ([place]) 11/22/2013  ? DM2 (diabetes mellitus, type 2) ([place]) 11/22/2013  ? Renal cyst, left 11/22/2013  ? Splenomegaly 11/22/2013  ? ? ?REFERRING DIAG: R26.9 (ICD-10-CM) - Unspecified abnormalities of gait and mobility ? ?THERAPY DIAG:  ?Unsteadiness on feet ? ?Muscle weakness (generalized) ? ?History of falling ? ?PERTINENT HISTORY: Diabetes, HTN, Hyperlipidemia, hip level amputee on right ? ?PRECAUTIONS: fall risk, Rt hip level amputee, walker use ? ?SUBJECTIVE: No cardiac symptoms since last session.  I'm wearing my ankle weights and I did my leg lifts today.   ? ?PAIN:  ?Are you having pain? No ? ?OBJECTIVE:  ? eval 11/22/21 ?LE MMT: ?  ?Amputee on right, Left generally 4/5 ?  ?FUNCTIONAL TESTS:  ?5 times sit to stand: 23.98 ?Timed up and go (TUG): 19.79 ?  ?GAIT: ?Distance walked: 50 ?Assistive device utilized: Environmental consultant - 2 wheeled ?Level of assistance: Complete Independence ?Comments: good step length ?12/19/21:  ?TUG: 22 seconds  ?5X sit to stand: 19.91 seconds-UE support required  ? ? TODAY'S TREATMENT:  ?Treatment: ?12/21/2021 ?NuStep: Level 7 x 8  minutes (400 steps)- multiple rest breaks taken due to shortness of breath.  HR 85, O2 99%  ?Seated toe  and heel raises Lt only x 20 ?Seated LAQ Lt only x 20 added 3 lb ankle weight ?Seated hamstring curl with red band 2 x 20 ?Seated marching: 3 lb on left ankle 2x10 bil each ?Hip abduction: seated with band around thighs ?Hip adduction: seated with purple ball ?Sit to stand  x10  with chair with arms purposefully not up against wall to teach proper weight shift and reaching back with left hand for left arm of chair first to unweight his prosthetic side for flexion, then reach back with right hand to lower himself with control.  ?Standing hip abduction and extension x 10 each LE. ? ?12/19/2021 ?NuStep: Level 8 x 7 min, backed down to level 6 for last minute for a total of  8  minutes (468  steps)- PT monitored and had pt pace himself to reduce rest breaks (6-7 short rest breaks taken) ?Standing hip abduction and extension: 3# on Lt LE: 2x10 bil each- close CGA for safety ?Seated LAQ Lt only x 20 with 3 lb ankle weight ?Seated hamstring curl with red band 2 x 10 ?Seated marching: 3#  on Lt ankle 2x10 ?Sit to stand  x10  with 1 UE on parallel bars- improved eccentric control today.  ?GAIT: ?Distance walked: 50 ?Assistive device utilized: Environmental consultant - 2 wheeled ?Level of assistance: Complete Independence ?Comments: good step length ?   ? ?12/13/2021 ?NuStep: Level 5 x 8  minutes (511 steps)- PT monitored and had pt pace himself to reduce rest breaks (2 short rest breaks taken) ?Standing hip abduction and extension: 3# on Lt LE: 2x10 bil each- close CGA for safety ?Seated LAQ Lt only x 20 added 3 lb ankle weight ?Seated hamstring curl with red band 2 x 10 ?Seated marching: 3# on Lt ankle 2x10 ?Sit to stand  x10  with 1 UE on parallel bars- improved eccentric control today.  ?GAIT: ?Distance walked: 50 ?Assistive device utilized: Environmental consultant - 2 wheeled ?Level of assistance: Complete Independence ?Comments: good step length ? ? ?  ?PATIENT EDUCATION:  ?Educated patient on various cardiac issues that diabetics often encounter.   Discussed s/s of infection and when to see MD if right foot is not improving.   ?Education details: HEP ?Person educated: Patient ?Education method: Explanation, Demonstration, Verbal cues, and Handouts ?Vernelle Emerald

## 2021-12-26 ENCOUNTER — Other Ambulatory Visit: Payer: Self-pay

## 2021-12-26 ENCOUNTER — Ambulatory Visit: Payer: No Typology Code available for payment source

## 2021-12-26 DIAGNOSIS — Z9181 History of falling: Secondary | ICD-10-CM

## 2021-12-26 DIAGNOSIS — R2681 Unsteadiness on feet: Secondary | ICD-10-CM

## 2021-12-26 DIAGNOSIS — M6281 Muscle weakness (generalized): Secondary | ICD-10-CM

## 2021-12-26 NOTE — Therapy (Signed)
?OUTPATIENT PHYSICAL THERAPY TREATMENT NOTE ? ? ?Patient Name: Tyler Whitehead ?MRN: [id number] ?DOB:October 26, 1940, 81 y.o., male ?Today's Date: 12/26/2021 ? ?PCP: Wenda Low, MD ?REFERRING PROVIDER: Wenda Low, MD ? ? PT End of Session - 12/26/21 1057   ? ? Visit Number 9   ? Date for PT Re-Evaluation 01/17/22   ? Authorization Type [employer]   ? Progress Note Due on Visit 10   ? PT Start Time 1018   ? PT Stop Time 1100   ? PT Time Calculation (min) 42 min   ? Activity Tolerance Patient tolerated treatment well   ? Behavior During Therapy [facility] for tasks assessed/performed   ? ?  ?  ? ?  ? ? ? ? ? ? ?Past Medical History:  ?Diagnosis Date  ? CAD (coronary artery disease)   ? a. S/p 3V CABG 2000 with LIMA-LAD, left radial-PDA, SVG-OM1. b. 2014: stent to SVG-OM1; then found to have obstruction distal to LAD anastamosis of LIMA s/p DES.   ? CHF (congestive heart failure) ([place])   ? Diverticular disease   ? DM (diabetes mellitus) ([place])   ? Hemorrhoid   ? HTN (hypertension)   ? Hyperlipidemia   ? Peripheral vascular disease ([place])   ? a. R femoropopliteal bypass ~2012 in [place], reportedly shown to be occluded. Now followed by Dr. [name].  ? 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[place])   ? RBBB   ? ?Past Surgical History:  ?Procedure Laterality Date  ? ABDOMINAL ANGIOGRAM N/A 01/18/2015  ? Procedure: ABDOMINAL ANGIOGRAM;  Surgeon: Serafina Mitchell, [place];  Location: [facility] C[facility] LAB;  Service: Cardiovascular;  Laterality: N/A;  ? ABDOMINAL AORTAGRAM N/A 09/21/2014  ? Procedure: ABDOMINAL AORTAGRAM;  Surgeon: Elam Dutch, [place];  Location: [facility] CATH LAB;  Service: Cardiovascular;  Laterality: N/A;  ? ABDOMINAL AORTOGRAM W/LOWER EXTREMITY N/A 03/28/2021  ? Procedure: ABDOMINAL AORTOGRAM W/LOWER EXTREMITY;  Surgeon: Serafina Mitchell, MD;  Location: [place] CV LAB;  Service: Cardiovascular;  Laterality: N/A;  ? CARDIAC CATHETERIZATION  May 2014  ? CORONARY ARTERY BYPASS GRAFT  2000  ? LM occluded, LIMA to LAD patent but distal native LAD disease,  saphenous vein bypass graft to OM with 95% stenosis in the midportion, right coronary artery occluded, radial artery graft to right coronary artery patent but was diffusely small. Marginal graft stented. 7 2014. Of note he had multiple previous stents in the right coronary artery. The distal LAD was treated with angioplasty. I  ? LOOP RECORDER IMPLANT N/A 07/12/2014  ? Procedure: LOOP RECORDER IMPLANT;  Surgeon: Evans Lance, MD;  Location: [facility] CATH LAB;  Service: Cardiovascular;  Laterality: N/A;  ? LOWER EXTREMITY ANGIOGRAM  09/21/2014  ? Procedure: LOWER EXTREMITY ANGIOGRAM;  Surgeon: Elam Dutch, MD;  Location: [facility] CATH LAB;  Service: Cardiovascular;;  ? PERCUTANEOUS CORONARY STENT INTERVENTION (PCI-S)  April 27 2013  ? right leg amputation  2017  ? TEE WITHOUT CARDIOVERSION N/A 07/12/2014  ? Procedure: TRANSESOPHAGEAL ECHOCARDIOGRAM (TEE);  Surgeon: Fay Records, [place];  Location: [place];  Service: Cardiovascular;  Laterality: N/A;  ? VEIN SURGERY    ? ?Patient Active Problem List  ? Diagnosis Date Noted  ? Status post above-knee amputation of right lower extremity ([place]) 09/08/2021  ? Atherosclerosis of native artery of left lower extremity with rest pain ([place]) 09/08/2021  ? 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 ([place]) 11/14/2020  ? Allergic rhinitis 11/14/2020  ? Chronic constipation 11/14/2020  ? Depression 11/14/2020  ? Diabetic peripheral neuropathy  associated with type 2 diabetes mellitus ([place]) 11/14/2020  ? Dysphagia 11/14/2020  ? Hyperlipidemia 11/14/2020  ? Leukocytosis 11/14/2020  ? Nausea and vomiting 11/14/2020  ? Presence of aortocoronary bypass graft 11/14/2020  ? Pure hypercholesterolemia 11/14/2020  ? Vitamin D deficiency 11/14/2020  ? Cryptogenic stroke ([place]) 07/20/2015  ? History of stroke 07/06/2015  ? Transient vision disturbance, right 07/06/2015  ? Atherosclerosis of native arteries of extremity with intermittent claudication ([place]) 09/27/2014  ? Localization-related symptomatic  epilepsy and epileptic syndromes with complex partial seizures, not intractable, without status epilepticus ([place]) 08/04/2014  ? Occipital stroke ([place]) 08/04/2014  ? Cerebral thrombosis with cerebral infarction ([place]) 07/10/2014  ? New onset seizure ([place]) 07/09/2014  ? Diabetic hyperosmolar non-ketotic state ([place]) 07/09/2014  ? Uncontrolled hypertension 07/09/2014  ? Polycythemia vera ([place]) 07/09/2014  ? Seizure ([place]) 07/09/2014  ? Visual field defect 07/09/2014  ? Headache 06/30/2014  ? HTN (hypertension) 11/23/2013  ? Dyslipidemia 11/23/2013  ? Myelofibrosis ([place]) 11/22/2013  ? Diverticular disease 11/22/2013  ? Hemorrhoids 11/22/2013  ? S/P CABG x 3 11/22/2013  ? CAD (coronary artery disease) 11/22/2013  ? Peripheral vascular disease ([place]) 11/22/2013  ? DM2 (diabetes mellitus, type 2) ([place]) 11/22/2013  ? Renal cyst, left 11/22/2013  ? Splenomegaly 11/22/2013  ? ? ?REFERRING DIAG: R26.9 (ICD-10-CM) - Unspecified abnormalities of gait and mobility ? ?THERAPY DIAG:  ?Muscle weakness (generalized) ? ?Unsteadiness on feet ? ?History of falling ? ?PERTINENT HISTORY: Diabetes, HTN, Hyperlipidemia, hip level amputee on right ? ?PRECAUTIONS: fall risk, Rt hip level amputee, walker use ? ?SUBJECTIVE: I'm not feeling too good this morning.  I have been breaking out in hives and I think it is one of my medications.  I had a fall getting into the car the other day.  I didn't get hurt, fell into the grass.  ? ?PAIN:  ?Are you having pain? No ? ?OBJECTIVE:  ? eval 11/22/21 ?LE MMT: ?  ?Amputee on right, Left generally 4/5 ?  ?FUNCTIONAL TESTS:  ?5 times sit to stand: 23.98 ?Timed up and go (TUG): 19.79 ?  ?GAIT: ?Distance walked: 50 ?Assistive device utilized: Environmental consultant - 2 wheeled ?Level of assistance: Complete Independence ?Comments: good step length ?12/19/21:  ?TUG: 22 seconds  ?5X sit to stand: 19.91 seconds-UE support required  ? ? TODAY'S TREATMENT:  ?Treatment: ?12/26/2021 ?NuStep: Level 4 x 8  minutes (540 steps)- multiple  rest breaks taken due to shortness of breath.  HR 85, O2 98%  ?Seated toe and heel raises Lt only x 20 ?Seated LAQ Lt only x 20 added 3 lb ankle weight ?Seated hamstring curl with red band 2 x 20 ?Seated marching: 3 lb on left ankle 2x10 bil each ?Hip abduction: seated with band around thighs ?Hip adduction: seated with purple ball ?Standing hip abduction 2x 10 each LE. ? ?12/21/2021 ?NuStep: Level 7 x 8  minutes (400 steps)- multiple rest breaks taken due to shortness of breath.  HR 85, O2 99%  ?Seated toe and heel raises Lt only x 20 ?Seated LAQ Lt only x 20 added 3 lb ankle weight ?Seated hamstring curl with red band 2 x 20 ?Seated marching: 3 lb on left ankle 2x10 bil each ?Hip abduction: seated with band around thighs ?Hip adduction: seated with purple ball 5" x20 ?Sit to stand  x10  with chair with arms purposefully not up against wall to teach proper weight shift and reaching back with left hand for left arm of chair first to unweight his prosthetic side  for flexion, then reach back with right hand to lower himself with control.  ?Standing hip abduction and extension x 10 each LE. ? ?12/19/2021 ?NuStep: Level 8 x 7 min, backed down to level 6 for last minute for a total of  8  minutes (468  steps)- PT monitored and had pt pace himself to reduce rest breaks (6-7 short rest breaks taken) ?Standing hip abduction and extension: 3# on Lt LE: 2x10 bil each- close CGA for safety ?Seated LAQ Lt only x 20 with 3 lb ankle weight ?Seated hamstring curl with red band 2 x 10 ?Seated marching: 3# on Lt ankle 2x10 ?Sit to stand  x10  with 1 UE on parallel bars- improved eccentric control today.  ?GAIT: ?Distance walked: 50 ?Assistive device utilized: Environmental consultant - 2 wheeled ?Level of assistance: Complete Independence ?Comments: good step length ?   ?  ?PATIENT EDUCATION:  ?Educated patient on various cardiac issues that diabetics often encounter.  Discussed s/s of infection and when to see [place] if right foot is not improving.    ?Education details: HEP ?Person educated: Patient ?Education method: Explanation, Demonstration, Verbal cues, and Handouts ?Education comprehension: verbalized understanding, returned demonstration, verbal cu

## 2021-12-27 NOTE — Progress Notes (Signed)
?  ?Cardiology Office Note ? ? ?Date:  12/28/2021  ? ?ID:  Tyler Whitehead, DOB 10/25/1940, MRN [id number] ? ?PCP:  Wenda Low, MD  ?Cardiologist:   None ?Referring:  Wenda Low, MD ? ?Chief Complaint  ?Patient presents with  ? Chest Pain  ? ? ?  ?History of Present Illness: ?Tyler Whitehead is a 81 y.o. male who presents for follow up of CAD/CABG.  I saw him last in 2017.  He moved to [place] and was moving back.   He has CAD he has had CABG and PVD. His last cath [place] suggested that "Medical therapy should be enhanced if the patient has ongoing exertional chest discomfort despite maximal medical management and if he has evidence of inferior ischemia on a perfusion study then repeat PCI of the native right coronary artery could be considered. However, given the patient's track record the right coronary artery is not likely to remain open even if PCI could be accomplished."  He is also managed for lower extremity disease as seen by Dr. Trula Slade.  He has had a CVA of unclear etiology had an implantable loop by Dr. Lovena Le.  Cardiac catheterization on 05/11/2020 in [place] demonstrated 100% left main and mid RCA in-stent lesions.  LIMA to the LAD apparently was patent collaterals to the right coronary artery.  He had a patent vein graft to an OM 1 with a patent stent in the vein graft.  He had an occluded vein graft to the RCA.  He had 100% left SFA occlusion.  He was managed medically.  He did have retrograde cannulation of his left tibial peroneal trunk and had this angioplasty.  Carotid Dopplers demonstrated 40 to 50% stenosis on the left.  Echo demonstrated an EF of 60%. ? ?Since I last saw him he has continued to have chest discomfort but it sounds like it is a stable exertional pattern for the most part.  However, upon further questioning he has taken about 3 bottles of nitroglycerin in the last year.  He has not had any in 2 or 3 weeks.  He usually happens when he moves about and he says quickly with his  walker and his prosthesis.  He does not really describe it happening at rest.  He is not describing PND or orthopnea.  He is not having any palpitations, presyncope or syncope.  He thinks the symptoms are pretty unchanged from the time of his catheterization in 2021.  It is clear there is some confusion around his medications. ? ? ?Past Medical History:  ?Diagnosis Date  ? CAD (coronary artery disease)   ? a. S/p 3V CABG 2000 with LIMA-LAD, left radial-PDA, SVG-OM1. b. 2014: stent to SVG-OM1; then found to have obstruction distal to LAD anastamosis of LIMA s/p DES.   ? CHF (congestive heart failure) ([place])   ? Diverticular disease   ? DM (diabetes mellitus) ([place])   ? Hemorrhoid   ? HTN (hypertension)   ? Hyperlipidemia   ? Peripheral vascular disease ([place])   ? a. R femoropopliteal bypass ~2012 in [place], reportedly shown to be occluded. Now followed by Dr. [name].  ? 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[place])   ? RBBB   ? ? ?Past Surgical History:  ?Procedure Laterality Date  ? ABDOMINAL ANGIOGRAM N/A 01/18/2015  ? Procedure: ABDOMINAL ANGIOGRAM;  Surgeon: Serafina Mitchell, MD;  Location: [facility] CATH LAB;  Service: Cardiovascular;  Laterality: N/A;  ? ABDOMINAL AORTAGRAM N/A 09/21/2014  ? Procedure: ABDOMINAL AORTAGRAM;  Surgeon: Jessy Oto  Fields, MD;  Location: [place] CATH LAB;  Service: Cardiovascular;  Laterality: N/A;  ? ABDOMINAL AORTOGRAM W/LOWER EXTREMITY N/A 03/28/2021  ? Procedure: ABDOMINAL AORTOGRAM W/LOWER EXTREMITY;  Surgeon: Serafina Mitchell, MD;  Location: [place] CV LAB;  Service: Cardiovascular;  Laterality: N/A;  ? CARDIAC CATHETERIZATION  May 2014  ? CORONARY ARTERY BYPASS GRAFT  2000  ? LM occluded, LIMA to LAD patent but distal native LAD disease, saphenous vein bypass graft to OM with 95% stenosis in the midportion, right coronary artery occluded, radial artery graft to right coronary artery patent but was diffusely small. Marginal graft stented. 7 2014. Of note he had multiple previous stents in the right coronary  artery. The distal LAD was treated with angioplasty. I  ? LOOP RECORDER IMPLANT N/A 07/12/2014  ? Procedure: LOOP RECORDER IMPLANT;  Surgeon: Evans Lance, MD;  Location: [facility] CATH LAB;  Service: Cardiovascular;  Laterality: N/A;  ? LOWER EXTREMITY ANGIOGRAM  09/21/2014  ? Procedure: LOWER EXTREMITY ANGIOGRAM;  Surgeon: Elam Dutch, MD;  Location: [facility] CATH LAB;  Service: Cardiovascular;;  ? PERCUTANEOUS CORONARY STENT INTERVENTION (PCI-S)  April 27 2013  ? right leg amputation  2017  ? TEE WITHOUT CARDIOVERSION N/A 07/12/2014  ? Procedure: TRANSESOPHAGEAL ECHOCARDIOGRAM (TEE);  Surgeon: Fay Records, MD;  Location: [place];  Service: Cardiovascular;  Laterality: N/A;  ? VEIN SURGERY    ? ? ? ?Current Outpatient Medications  ?Medication Sig Dispense Refill  ? aspirin 81 MG chewable tablet Chew 81 mg by mouth daily.    ? atorvastatin (LIPITOR) 40 MG tablet Take 40 mg by mouth at bedtime.    ? cilostazol (PLETAL) 100 MG tablet Take 100 mg by mouth 2 (two) times daily.    ? fluticasone (FLONASE) 50 MCG/ACT nasal spray 1 spray in each nostril    ? insulin glargine (LANTUS) 100 UNIT/ML injection Inject 10 Units into the skin at bedtime.    ? insulin lispro (HUMALOG) 100 UNIT/ML injection Inject 4 Units into the skin daily as needed for high blood sugar.    ? isosorbide mononitrate (IMDUR) 60 MG 24 hr tablet Take 1-2 tablets (60-120 mg total) by mouth 2 (two) times daily. 2 in the morning and 1 in the evening 270 tablet 3  ? Lancets (ONETOUCH DELICA PLUS [id number]) MISC Apply topically 2 (two) times daily.    ? levocetirizine (XYZAL) 5 MG tablet Take 5 mg by mouth every evening.    ? losartan (COZAAR) 25 MG tablet Take 25 mg by mouth at bedtime.    ? ONETOUCH VERIO test strip 1 each 2 (two) times daily.    ? PROAIR HFA 108 (90 BASE) MCG/ACT inhaler Inhale 1 puff into the lungs every 4 (four) hours as needed for wheezing or shortness of breath.     ? amoxicillin (AMOXIL) 500 MG capsule Take 500 mg by mouth 3 (three)  times daily.    ? carvedilol (COREG) 6.25 MG tablet Take 1 tablet (6.25 mg total) by mouth 2 (two) times daily with a meal. 180 tablet 3  ? gabapentin (NEURONTIN) 300 MG capsule Take 300 mg by mouth at bedtime. (Patient not taking: Reported on 12/28/2021)    ? hydroxyurea (HYDREA) 500 MG capsule Take 2 capsules in morning and 1 capsule in evening 90 capsule 3  ? ketoconazole (NIZORAL) 2 % cream Apply 1 application topically daily. (Patient not taking: Reported on 12/28/2021) 60 g 2  ? nitroGLYCERIN (NITROSTAT) 0.4 MG SL tablet Place 1 tablet (0.4  mg total) under the tongue every 5 (five) minutes as needed for chest pain. 25 tablet 3  ? ?No current facility-administered medications for this visit.  ? ? ?Allergies:   Other  ? ? ?ROS:  Please see the history of present illness.   Otherwise, review of systems are positive for none.   All other systems are reviewed and negative.  ? ? ?PHYSICAL EXAM: ?VS:  BP 136/66   Pulse 77   Ht '5\' 7"'$  (1.702 m)   Wt 167 lb 12.8 oz (76.1 kg)   SpO2 98%   BMI 26.28 kg/m?  , BMI Body mass index is 26.28 kg/m?. ?GENERAL:  Well appearing ?NECK:  No jugular venous distention, waveform within normal limits, carotid upstroke brisk and symmetric, no bruits, no thyromegaly ?LUNGS:  Clear to auscultation bilaterally ?CHEST:  Well healed sternotomy scar. ?HEART:  PMI not displaced or sustained,S1 and S2 within normal limits, no S3, no S4, no clicks, no rubs, no murmurs ?ABD:  Flat, positive bowel sounds normal in frequency in pitch, no bruits, no rebound, no guarding, no midline pulsatile mass, no hepatomegaly, no splenomegaly ?EXT:  2 plus pulses upper, left leg swelling with absent dorsalis pedis and posterior tibialis, no cyanosis no clubbing status post right AKA ? ? ?EKG:  EKG is  ordered today. ?Sinus rhythm, rate 77, premature atrial contractions, low voltage in the limb lead, right bundle branch block, no acute ST-T wave changes. ? ?Recent Labs: ?12/14/2021: ALT 9; BUN 17; Creatinine  1.05; Hemoglobin 11.0; Platelets 540; Potassium 4.7; Sodium 136  ? ? ?Lipid Panel ?   ?Component Value Date/Time  ? CHOL 94 07/11/2014 0430  ? TRIG 101 07/11/2014 0430  ? HDL 34 (L) 07/11/2014 0430  ? CHOLHDL 2.8 07/12/19

## 2021-12-28 ENCOUNTER — Other Ambulatory Visit: Payer: Self-pay

## 2021-12-28 ENCOUNTER — Ambulatory Visit (INDEPENDENT_AMBULATORY_CARE_PROVIDER_SITE_OTHER): Payer: No Typology Code available for payment source | Admitting: Cardiology

## 2021-12-28 ENCOUNTER — Ambulatory Visit: Payer: No Typology Code available for payment source

## 2021-12-28 ENCOUNTER — Encounter: Payer: Self-pay | Admitting: Cardiology

## 2021-12-28 ENCOUNTER — Telehealth: Payer: Self-pay | Admitting: Licensed Clinical Social Worker

## 2021-12-28 VITALS — BP 136/66 | HR 77 | Ht 67.0 in | Wt 167.8 lb

## 2021-12-28 DIAGNOSIS — E785 Hyperlipidemia, unspecified: Secondary | ICD-10-CM | POA: Diagnosis not present

## 2021-12-28 DIAGNOSIS — E118 Type 2 diabetes mellitus with unspecified complications: Secondary | ICD-10-CM

## 2021-12-28 DIAGNOSIS — I739 Peripheral vascular disease, unspecified: Secondary | ICD-10-CM

## 2021-12-28 DIAGNOSIS — Z9181 History of falling: Secondary | ICD-10-CM

## 2021-12-28 DIAGNOSIS — M6281 Muscle weakness (generalized): Secondary | ICD-10-CM

## 2021-12-28 DIAGNOSIS — R2681 Unsteadiness on feet: Secondary | ICD-10-CM

## 2021-12-28 DIAGNOSIS — I2510 Atherosclerotic heart disease of native coronary artery without angina pectoris: Secondary | ICD-10-CM | POA: Diagnosis not present

## 2021-12-28 MED ORDER — NITROGLYCERIN 0.4 MG SL SUBL: 0.4000 mg | SUBLINGUAL_TABLET | SUBLINGUAL | 3 refills | Status: DC | PRN

## 2021-12-28 MED ORDER — CARVEDILOL 6.25 MG PO TABS: 6.2500 mg | ORAL_TABLET | Freq: Two times a day (BID) | ORAL | 3 refills | Status: DC

## 2021-12-28 MED ORDER — CARVEDILOL 6.25 MG PO TABS
6.2500 mg | ORAL_TABLET | Freq: Two times a day (BID) | ORAL | 3 refills | Status: DC
Start: 2021-12-28 — End: 2022-12-11

## 2021-12-28 NOTE — Patient Instructions (Addendum)
Medication Instructions:  ?INCREASE carvedilol (Coreg) to 6.25 mg two times daily ? ?*If you need a refill on your cardiac medications before your next appointment, please call your pharmacy* ? ?Follow-Up: ?At [facility], you and your health needs are our priority.  As part of our continuing mission to provide you with exceptional heart care, we have created designated Provider Care Teams.  These Care Teams include your primary Cardiologist (physician) and Advanced Practice Providers (APPs -  Physician Assistants and Nurse Practitioners) who all work together to provide you with the care you need, when you need it. ? ?We recommend signing up for the patient portal called "MyChart".  Sign up information is provided on this After Visit Summary.  MyChart is used to connect with patients for Virtual Visits (Telemedicine).  Patients are able to view lab/test results, encounter notes, upcoming appointments, etc.  Non-urgent messages can be sent to your provider as well.   ?To learn more about what you can do with MyChart, go to [email or web address].   ? ?Your next appointment:   ?Pharmacist-BRING ALL MEDICATIONS WITH YOU ? ?3 months with PA/NP ? ? ? ?

## 2021-12-28 NOTE — Therapy (Addendum)
?OUTPATIENT PHYSICAL THERAPY TREATMENT NOTE ? ? ?Patient Name: Tyler Whitehead ?MRN: [id number] ?DOB:Sep 08, 1941, 81 y.o., male ?Today's Date: 01/02/2022 ? ?PCP: Wenda Low, MD ?REFERRING PROVIDER: Wenda Low, MD ? ? ? ? ? ? ? ? ?Past Medical History:  ?Diagnosis Date  ? CAD (coronary artery disease)   ? a. S/p 3V CABG 2000 with LIMA-LAD, left radial-PDA, SVG-OM1. b. 2014: stent to SVG-OM1; then found to have obstruction distal to LAD anastamosis of LIMA s/p DES.   ? CHF (congestive heart failure) ([place])   ? Diverticular disease   ? DM (diabetes mellitus) ([place])   ? Hemorrhoid   ? HTN (hypertension)   ? Hyperlipidemia   ? Peripheral vascular disease ([place])   ? a. R femoropopliteal bypass ~2012 in [place], reportedly shown to be occluded. Now followed by Dr. [name].  ? 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)   ? RBBB   ? ?Past Surgical History:  ?Procedure Laterality Date  ? ABDOMINAL ANGIOGRAM N/A 01/18/2015  ? Procedure: ABDOMINAL ANGIOGRAM;  Surgeon: Serafina Mitchell, MD;  Location: [facility] CATH LAB;  Service: Cardiovascular;  Laterality: N/A;  ? ABDOMINAL AORTAGRAM N/A 09/21/2014  ? Procedure: ABDOMINAL AORTAGRAM;  Surgeon: Elam Dutch, MD;  Location: [facility] CATH LAB;  Service: Cardiovascular;  Laterality: N/A;  ? ABDOMINAL AORTOGRAM W/LOWER EXTREMITY N/A 03/28/2021  ? Procedure: ABDOMINAL AORTOGRAM W/LOWER EXTREMITY;  Surgeon: Serafina Mitchell, MD;  Location: [place] CV LAB;  Service: Cardiovascular;  Laterality: N/A;  ? CARDIAC CATHETERIZATION  May 2014  ? CORONARY ARTERY BYPASS GRAFT  2000  ? LM occluded, LIMA to LAD patent but distal native LAD disease, saphenous vein bypass graft to OM with 95% stenosis in the midportion, right coronary artery occluded, radial artery graft to right coronary artery patent but was diffusely small. Marginal graft stented. 7 2014. Of note he had multiple previous stents in the right coronary artery. The distal LAD was treated with angioplasty. I  ? LOOP RECORDER IMPLANT N/A 07/12/2014  ? Procedure:  LOOP RECORDER IMPLANT;  Surgeon: Evans Lance, MD;  Location: [facility] CATH LAB;  Service: Cardiovascular;  Laterality: N/A;  ? LOWER EXTREMITY ANGIOGRAM  09/21/2014  ? Procedure: LOWER EXTREMITY ANGIOGRAM;  Surgeon: Elam Dutch, MD;  Location: [facility] CATH LAB;  Service: Cardiovascular;;  ? PERCUTANEOUS CORONARY STENT INTERVENTION (PCI-S)  April 27 2013  ? right leg amputation  2017  ? TEE WITHOUT CARDIOVERSION N/A 07/12/2014  ? Procedure: TRANSESOPHAGEAL ECHOCARDIOGRAM (TEE);  Surgeon: Fay Records, MD;  Location: [place];  Service: Cardiovascular;  Laterality: N/A;  ? VEIN SURGERY    ? ?Patient Active Problem List  ? Diagnosis Date Noted  ? Status post above-knee amputation of right lower extremity ([place]) 09/08/2021  ? Atherosclerosis of native artery of left lower extremity with rest pain ([place]) 09/08/2021  ? 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 ([place]) 11/14/2020  ? Allergic rhinitis 11/14/2020  ? Chronic constipation 11/14/2020  ? Depression 11/14/2020  ? Diabetic peripheral neuropathy associated with type 2 diabetes mellitus ([place]) 11/14/2020  ? Dysphagia 11/14/2020  ? Hyperlipidemia 11/14/2020  ? Leukocytosis 11/14/2020  ? Nausea and vomiting 11/14/2020  ? Presence of aortocoronary bypass graft 11/14/2020  ? Pure hypercholesterolemia 11/14/2020  ? Vitamin D deficiency 11/14/2020  ? Cryptogenic stroke ([place]) 07/20/2015  ? History of stroke 07/06/2015  ? Transient vision disturbance, right 07/06/2015  ? Atherosclerosis of native arteries of extremity with intermittent claudication ([place]) 09/27/2014  ? Localization-related symptomatic epilepsy and epileptic syndromes with complex partial seizures, not  intractable, without status epilepticus ([place]) 08/04/2014  ? Occipital stroke ([place]) 08/04/2014  ? Cerebral thrombosis with cerebral infarction ([place]) 07/10/2014  ? New onset seizure ([place]) 07/09/2014  ? Diabetic hyperosmolar non-ketotic state ([place]) 07/09/2014  ? Uncontrolled hypertension 07/09/2014  ? Polycythemia  vera ([place]) 07/09/2014  ? Seizure ([place]) 07/09/2014  ? Visual field defect 07/09/2014  ? Headache 06/30/2014  ? HTN (hypertension) 11/23/2013  ? Dyslipidemia 11/23/2013  ? Myelofibrosis ([place]) 11/22/2013  ? Diverticular disease 11/22/2013  ? Hemorrhoids 11/22/2013  ? S/P CABG x 3 11/22/2013  ? CAD (coronary artery disease) 11/22/2013  ? Peripheral vascular disease ([place]) 11/22/2013  ? DM2 (diabetes mellitus, type 2) ([place]) 11/22/2013  ? Renal cyst, left 11/22/2013  ? Splenomegaly 11/22/2013  ? ? ?REFERRING DIAG: R26.9 (ICD-10-CM) - Unspecified abnormalities of gait and mobility ? ?THERAPY DIAG:  ?Muscle weakness (generalized) ? ?Unsteadiness on feet ? ?History of falling ? ?PERTINENT HISTORY: Diabetes, HTN, Hyperlipidemia, hip level amputee on right ? ?PRECAUTIONS: fall risk, Rt hip level amputee, walker use ? ?SUBJECTIVE: Patient states he is doing ok this morning.  Left foot still hurting but he is staying on top of things and keeping a check on it daily.    ? ?PAIN:  ?Are you having pain? No ? ?OBJECTIVE:  ?Physical Therapy Progress Note ? ?Dates of Reporting Period: 11/22/21 to 12/28/21 ? ?Objective Reports of Subjective Statement: See below ? ?Objective Measurements: see below ? ?Goal Update: see below ? ?Plan: see below ? ?Reason Skilled Services are Required: Patient continues to be fall risk.   ? eval 11/22/21  Re-assessment on 12/28/21 ?LE MMT: ?  ?Amputee on right, Left generally 4+/5 ?  ?FUNCTIONAL TESTS:  ?5 times sit to stand: 23.98  (15.71 sec 12/28/21) ?Timed up and go (TUG): 19.79  (18.64 sec 12/28/21) ?  ?GAIT: ?Distance walked: 50 ?Assistive device utilized: Environmental consultant - 2 wheeled ?Level of assistance: Complete Independence ?Comments: good step length ?12/19/21:  ?TUG: 22 seconds  ?5X sit to stand: 19.91 seconds-UE support required  ? ? TODAY'S TREATMENT:  ?Treatment: ?12/28/2021 ?NuStep: Level 5 x 8  minutes (500 steps) (had bathroom break) ?10th visit Re-assessment completed: see above ?Seated toe and heel raises  Lt only x 20 ?Seated LAQ Lt only x 20 added 6 lb ankle weight ?Seated hamstring curl with red band 2 x 20 ?Seated marching: 6 lb on left ankle 2x10 bil each ?Hip abduction: seated with band around thighs (green) ?Hip adduction: seated with purple ball ?Gait training/walk test 280 feet with rolling walker without rest break ? ? ?12/26/2021 ?NuStep: Level 4 x 8  minutes (540 steps)- multiple rest breaks taken due to shortness of breath.  HR 85, O2 98%  ?Seated toe and heel raises Lt only x 20 ?Seated LAQ Lt only x 20 added 3 lb ankle weight ?Seated hamstring curl with red band 2 x 20 ?Seated marching: 3 lb on left ankle 2x10 bil each ?Hip abduction: seated with band around thighs ?Hip adduction: seated with purple ball ?Standing hip abduction 2x 10 each LE. ? ?12/21/2021 ?NuStep: Level 7 x 8  minutes (400 steps)- multiple rest breaks taken due to shortness of breath.  HR 85, O2 99%  ?Seated toe and heel raises Lt only x 20 ?Seated LAQ Lt only x 20 added 3 lb ankle weight ?Seated hamstring curl with red band 2 x 20 ?Seated marching: 3 lb on left ankle 2x10 bil each ?Hip abduction: seated with band around thighs ?Hip adduction: seated with purple  ball 5" x20 ?Sit to stand  x10  with chair with arms purposefully not up against wall to teach proper weight shift and reaching back with left hand for left arm of chair first to unweight his prosthetic side for flexion, then reach back with right hand to lower himself with control.  ?Standing hip abduction and extension x 10 each LE. ? ?  ?GAIT: ?Distance walked: 280 ?Assistive device utilized: Environmental consultant - 2 wheeled ?Level of assistance: Complete Independence ?Comments: good step length with minor verbal cues to avoid excessive step length on right.  ?   ?  ?PATIENT EDUCATION:  ?Educated patient on various cardiac issues that diabetics often encounter.  Discussed s/s of infection and when to see MD if right foot is not improving.   ?Education details: HEP ?Person educated:  Patient ?Education method: Explanation, Demonstration, Verbal cues, and Handouts ?Education comprehension: verbalized understanding, returned demonstration, verbal cues required, tactile cues required, and n

## 2021-12-28 NOTE — Telephone Encounter (Signed)
LCSW assisted pt with final ride through [place] as [facility] had arranged ride from his appt there this morning to our office.  ?I have mailed him further information including Transportation Resources list and information about using his [employer] information (per benefits book online pt gets unlimited PCP rides and 24 one way rides to other appointments annually). I remain available as needed for assistance.  ? ?Westley Hummer, MSW, LCSW ?Clinical Social Worker II ?[facility]  ?[phone]- work cell phone (preferred) ?[phone]- desk phone ? ?

## 2022-01-02 ENCOUNTER — Ambulatory Visit: Payer: No Typology Code available for payment source

## 2022-01-02 ENCOUNTER — Other Ambulatory Visit: Payer: Self-pay

## 2022-01-02 DIAGNOSIS — M6281 Muscle weakness (generalized): Secondary | ICD-10-CM

## 2022-01-02 DIAGNOSIS — R2681 Unsteadiness on feet: Secondary | ICD-10-CM

## 2022-01-02 NOTE — Therapy (Signed)
?OUTPATIENT PHYSICAL THERAPY TREATMENT NOTE ? ? ?Patient Name: Tyler Whitehead ?MRN: [id number] ?DOB:[birth year], 81 y.o., male ?Today's Date: 01/02/2022 ? ?PCP: Wenda Low, MD ?REFERRING PROVIDER: Wenda Low, MD ? ? PT End of Session - 01/02/22 1142   ? ? Visit Number 11   ? Date for PT Re-Evaluation 01/17/22   ? Authorization Type [employer]   ? Progress Note Due on Visit 20   ? PT Start Time 1101   ? PT Stop Time 0321   ? PT Time Calculation (min) 42 min   ? Activity Tolerance Patient tolerated treatment well   ? Behavior During Therapy [facility] for tasks assessed/performed   ? ?  ?  ? ?  ? ? ? ? ? ? ? ?Past Medical History:  ?Diagnosis Date  ? CAD (coronary artery disease)   ? a. S/p 3V CABG 2000 with LIMA-LAD, left radial-PDA, SVG-OM1. b. 2014: stent to SVG-OM1; then found to have obstruction distal to LAD anastamosis of LIMA s/p DES.   ? CHF (congestive heart failure) ([place])   ? Diverticular disease   ? DM (diabetes mellitus) ([place])   ? Hemorrhoid   ? HTN (hypertension)   ? Hyperlipidemia   ? Peripheral vascular disease ([place])   ? a. R femoropopliteal bypass ~2012 in [place], reportedly shown to be occluded. Now followed by Dr. [name].  ? 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[place])   ? RBBB   ? ?Past Surgical History:  ?Procedure Laterality Date  ? ABDOMINAL ANGIOGRAM N/A 01/18/2015  ? Procedure: ABDOMINAL ANGIOGRAM;  Surgeon: Serafina Mitchell, MD;  Location: [facility] CATH LAB;  Service: Cardiovascular;  Laterality: N/A;  ? ABDOMINAL AORTAGRAM N/A 09/21/2014  ? Procedure: ABDOMINAL AORTAGRAM;  Surgeon: Elam Dutch, MD;  Location: [facility] CATH LAB;  Service: Cardiovascular;  Laterality: N/A;  ? ABDOMINAL AORTOGRAM W/LOWER EXTREMITY N/A 03/28/2021  ? Procedure: ABDOMINAL AORTOGRAM W/LOWER EXTREMITY;  Surgeon: Serafina Mitchell, MD;  Location: [place] CV LAB;  Service: Cardiovascular;  Laterality: N/A;  ? CARDIAC CATHETERIZATION  May 2014  ? CORONARY ARTERY BYPASS GRAFT  2000  ? LM occluded, LIMA to LAD patent but distal native LAD disease,  saphenous vein bypass graft to OM with 95% stenosis in the midportion, right coronary artery occluded, radial artery graft to right coronary artery patent but was diffusely small. Marginal graft stented. 7 2014. Of note he had multiple previous stents in the right coronary artery. The distal LAD was treated with angioplasty. I  ? LOOP RECORDER IMPLANT N/A 07/12/2014  ? Procedure: LOOP RECORDER IMPLANT;  Surgeon: Evans Lance, MD;  Location: [facility] CATH LAB;  Service: Cardiovascular;  Laterality: N/A;  ? LOWER EXTREMITY ANGIOGRAM  09/21/2014  ? Procedure: LOWER EXTREMITY ANGIOGRAM;  Surgeon: Elam Dutch, MD;  Location: [facility] CATH LAB;  Service: Cardiovascular;;  ? PERCUTANEOUS CORONARY STENT INTERVENTION (PCI-S)  April 27 2013  ? right leg amputation  2017  ? TEE WITHOUT CARDIOVERSION N/A 07/12/2014  ? Procedure: TRANSESOPHAGEAL ECHOCARDIOGRAM (TEE);  Surgeon: Fay Records, MD;  Location: [place];  Service: Cardiovascular;  Laterality: N/A;  ? VEIN SURGERY    ? ?Patient Active Problem List  ? Diagnosis Date Noted  ? Status post above-knee amputation of right lower extremity ([place]) 09/08/2021  ? Atherosclerosis of native artery of left lower extremity with rest pain ([place]) 09/08/2021  ? 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 ([place]) 11/14/2020  ? Allergic rhinitis 11/14/2020  ? Chronic constipation 11/14/2020  ? Depression 11/14/2020  ? Diabetic peripheral  neuropathy associated with type 2 diabetes mellitus ([place]) 11/14/2020  ? Dysphagia 11/14/2020  ? Hyperlipidemia 11/14/2020  ? Leukocytosis 11/14/2020  ? Nausea and vomiting 11/14/2020  ? Presence of aortocoronary bypass graft 11/14/2020  ? Pure hypercholesterolemia 11/14/2020  ? Vitamin D deficiency 11/14/2020  ? 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 History of stroke 07/06/2015  ? Transient vision disturbance, right 07/06/2015  ? Atherosclerosis of native arteries of extremity with intermittent claudication ([place]) 09/27/2014  ? 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 Occipital stroke ([place]) 08/04/2014  ? Cerebral thrombosis with cerebral infarction ([place]) 07/10/2014  ? New onset seizure ([place]) 07/09/2014  ? Diabetic hyperosmolar non-ketotic state ([place]) 07/09/2014  ? Uncontrolled hypertension 07/09/2014  ? Polycythemia vera ([place]) 07/09/2014  ? Seizure ([place]) 07/09/2014  ? Visual field defect 07/09/2014  ? Headache 06/30/2014  ? HTN (hypertension) 11/23/2013  ? Dyslipidemia 11/23/2013  ? Myelofibrosis ([place]) 11/22/2013  ? Diverticular disease 11/22/2013  ? Hemorrhoids 11/22/2013  ? S/P CABG x 3 11/22/2013  ? CAD (coronary artery disease) 11/22/2013  ? Peripheral vascular disease ([place]) 11/22/2013  ? DM2 (diabetes mellitus, type 2) ([place]) 11/22/2013  ? Renal cyst, left 11/22/2013  ? Splenomegaly 11/22/2013  ? ? ?REFERRING DIAG: R26.9 (ICD-10-CM) - Unspecified abnormalities of gait and mobility ? ?THERAPY DIAG:  ?Muscle weakness (generalized) ? ?Unsteadiness on feet ? ?PERTINENT HISTORY: Diabetes, HTN, Hyperlipidemia, hip level amputee on right ? ?PRECAUTIONS: fall risk, Rt hip level amputee, walker use ? ?SUBJECTIVE: My heart has been ok.  I have intermittent pain in my chest and my MD is checking on some things.  ? ?PAIN:  ?Are you having pain? No ? ?OBJECTIVE:  ? eval 11/22/21  Re-assessment on 12/28/21 ?LE MMT: ?  ?Amputee on right, Left generally 4+/5 ?  ?FUNCTIONAL TESTS:  ?5 times sit to stand: 23.98  (15.71 sec 12/28/21) ?Timed up and go (TUG): 19.79  (18.64 sec 12/28/21) ?  ?GAIT: ?Distance walked: 50 ?Assistive device utilized: Environmental consultant - 2 wheeled ?Level of assistance: Complete Independence ?Comments: good step length ?12/19/21:  ?TUG: 22 seconds  ?5X sit to stand: 19.91 seconds-UE support required  ? ? TODAY'S TREATMENT:  ?Treatment: ? ?01/02/2022 ?NuStep: Level 5 x 8  minutes (short rest break taken-610 steps) ?Seated toe and heel raises Lt only x 20 ?Seated  LAQ Lt only x 20 added 6 lb ankle weight ?Seated hamstring curl with red band 2 x 20 ?Seated marching: 6 lb on left ankle 2x10 bil each ?Hip abduction and extension: standing 2x10 bil each with UE support- close guard by PT in parallel bars ?Hip adduction: seated with purple ball ?Gait training/walk test 280 feet with rolling walker without rest break ? ?12/28/2021 ?NuStep: Level 5 x 8  minutes (500 steps) (had bathroom break) ?10th visit Re-assessment completed: see above ?Seated toe and heel raises Lt only x 20 ?Seated LAQ Lt only x 20 added 6 lb ankle weight ?Seated hamstring curl with red band 2 x 20 ?Seated marching: 6 lb on left ankle 2x10 bil each ?Hip abduction: seated with band around thighs (green) ?Hip adduction: seated with purple ball ?Gait training/walk test 280 feet with rolling walker without rest break ? ? ?12/26/2021 ?NuStep: Level 4 x 8  minutes (540 steps)- multiple rest breaks taken due to shortness of breath.  HR 85, O2 98%  ?Seated toe and heel raises Lt only x 20 ?Seated LAQ Lt only x 20 added 3  lb ankle weight ?Seated hamstring curl with red band 2 x 20 ?Seated marching: 3 lb on left ankle 2x10 bil each ?Hip abduction: seated with band around thighs ?Hip adduction: seated with purple ball ?Standing hip abduction 2x 10 each LE. ?   ?  ?PATIENT EDUCATION:  ?Educated patient on various cardiac issues that diabetics often encounter.  Discussed s/s of infection and when to see MD if right foot is not improving.   ?Education details: HEP ?Person educated: Patient ?Education method: Explanation, Demonstration, Verbal cues, and Handouts ?Education comprehension: verbalized understanding, returned demonstration, verbal cues required, tactile cues required, and needs further education ? ?CLINICAL IMPRESSION: ?Pt continues to show improvement in overall mobility.  Pt improved both 5x sit to stand and TUG last week, indicating reduced falls risk and improved function. He is having some issues with the  left foot and intermittent cardiac issues that is limiting his ability to exercise at home.  PT provided close supervision and guarding for higher level tasks for safety and pt remains challenged by cu

## 2022-01-04 ENCOUNTER — Ambulatory Visit: Payer: No Typology Code available for payment source

## 2022-01-04 DIAGNOSIS — M6281 Muscle weakness (generalized): Secondary | ICD-10-CM

## 2022-01-04 DIAGNOSIS — R2681 Unsteadiness on feet: Secondary | ICD-10-CM

## 2022-01-04 DIAGNOSIS — Z9181 History of falling: Secondary | ICD-10-CM

## 2022-01-04 NOTE — Therapy (Signed)
?OUTPATIENT PHYSICAL THERAPY TREATMENT NOTE ? ? ?Patient Name: Tyler Whitehead ?MRN: [id number] ?DOB:1941-07-05, 81 y.o., male ?Today's Date: 01/04/2022 ? ?PCP: Wenda Low, MD ?REFERRING PROVIDER: Wenda Low, MD ? ? PT End of Session - 01/04/22 1013   ? ? Visit Number 12   ? Date for PT Re-Evaluation 01/17/22   ? Authorization Type [employer]   ? Progress Note Due on Visit 20   ? PT Start Time 1008   ? PT Stop Time 1055   ? PT Time Calculation (min) 47 min   ? Activity Tolerance Patient tolerated treatment well   ? Behavior During Therapy [facility] for tasks assessed/performed   ? ?  ?  ? ?  ? ? ? ? ? ? ? ?Past Medical History:  ?Diagnosis Date  ? CAD (coronary artery disease)   ? a. S/p 3V CABG 2000 with LIMA-LAD, left radial-PDA, SVG-OM1. b. 2014: stent to SVG-OM1; then found to have obstruction distal to LAD anastamosis of LIMA s/p DES.   ? CHF (congestive heart failure) ([place])   ? Diverticular disease   ? DM (diabetes mellitus) ([place])   ? Hemorrhoid   ? HTN (hypertension)   ? Hyperlipidemia   ? Peripheral vascular disease ([place])   ? a. R femoropopliteal bypass ~2012 in [place], reportedly shown to be occluded. Now followed by Dr. [name].  ? 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[place])   ? RBBB   ? ?Past Surgical History:  ?Procedure Laterality Date  ? ABDOMINAL ANGIOGRAM N/A 01/18/2015  ? Procedure: ABDOMINAL ANGIOGRAM;  Surgeon: Serafina Mitchell, MD;  Location: [facility] CATH LAB;  Service: Cardiovascular;  Laterality: N/A;  ? ABDOMINAL AORTAGRAM N/A 09/21/2014  ? Procedure: ABDOMINAL AORTAGRAM;  Surgeon: Elam Dutch, MD;  Location: [facility] CATH LAB;  Service: Cardiovascular;  Laterality: N/A;  ? ABDOMINAL AORTOGRAM W/LOWER EXTREMITY N/A 03/28/2021  ? Procedure: ABDOMINAL AORTOGRAM W/LOWER EXTREMITY;  Surgeon: Serafina Mitchell, MD;  Location: [place] CV LAB;  Service: Cardiovascular;  Laterality: N/A;  ? CARDIAC CATHETERIZATION  May 2014  ? CORONARY ARTERY BYPASS GRAFT  2000  ? LM occluded, LIMA to LAD patent but distal native LAD disease,  saphenous vein bypass graft to OM with 95% stenosis in the midportion, right coronary artery occluded, radial artery graft to right coronary artery patent but was diffusely small. Marginal graft stented. 7 2014. Of note he had multiple previous stents in the right coronary artery. The distal LAD was treated with angioplasty. I  ? LOOP RECORDER IMPLANT N/A 07/12/2014  ? Procedure: LOOP RECORDER IMPLANT;  Surgeon: Evans Lance, MD;  Location: [facility] CATH LAB;  Service: Cardiovascular;  Laterality: N/A;  ? LOWER EXTREMITY ANGIOGRAM  09/21/2014  ? Procedure: LOWER EXTREMITY ANGIOGRAM;  Surgeon: Elam Dutch, MD;  Location: [facility] CATH LAB;  Service: Cardiovascular;;  ? PERCUTANEOUS CORONARY STENT INTERVENTION (PCI-S)  April 27 2013  ? right leg amputation  2017  ? TEE WITHOUT CARDIOVERSION N/A 07/12/2014  ? Procedure: TRANSESOPHAGEAL ECHOCARDIOGRAM (TEE);  Surgeon: Fay Records, MD;  Location: [place];  Service: Cardiovascular;  Laterality: N/A;  ? VEIN SURGERY    ? ?Patient Active Problem List  ? Diagnosis Date Noted  ? Status post above-knee amputation of right lower extremity ([place]) 09/08/2021  ? Atherosclerosis of native artery of left lower extremity with rest pain ([place]) 09/08/2021  ? 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 ([place]) 11/14/2020  ? Allergic rhinitis 11/14/2020  ? Chronic constipation 11/14/2020  ? Depression 11/14/2020  ? Diabetic peripheral  neuropathy associated with type 2 diabetes mellitus ([place]) 11/14/2020  ? Dysphagia 11/14/2020  ? Hyperlipidemia 11/14/2020  ? Leukocytosis 11/14/2020  ? Nausea and vomiting 11/14/2020  ? Presence of aortocoronary bypass graft 11/14/2020  ? Pure hypercholesterolemia 11/14/2020  ? Vitamin D deficiency 11/14/2020  ? Cryptogenic stroke ([place]) 07/20/2015  ? History of stroke 07/06/2015  ? Transient vision disturbance, right 07/06/2015  ? Atherosclerosis of native arteries of extremity with intermittent claudication ([place]) 09/27/2014  ? Localization-related symptomatic  epilepsy and epileptic syndromes with complex partial seizures, not intractable, without status epilepticus ([place]) 08/04/2014  ? Occipital stroke ([place]) 08/04/2014  ? Cerebral thrombosis with cerebral infarction ([place]) 07/10/2014  ? New onset seizure ([place]) 07/09/2014  ? Diabetic hyperosmolar non-ketotic state ([place]) 07/09/2014  ? Uncontrolled hypertension 07/09/2014  ? Polycythemia vera ([place]) 07/09/2014  ? Seizure ([place]) 07/09/2014  ? Visual field defect 07/09/2014  ? Headache 06/30/2014  ? HTN (hypertension) 11/23/2013  ? Dyslipidemia 11/23/2013  ? Myelofibrosis ([place]) 11/22/2013  ? Diverticular disease 11/22/2013  ? Hemorrhoids 11/22/2013  ? S/P CABG x 3 11/22/2013  ? CAD (coronary artery disease) 11/22/2013  ? Peripheral vascular disease ([place]) 11/22/2013  ? DM2 (diabetes mellitus, type 2) ([place]) 11/22/2013  ? Renal cyst, left 11/22/2013  ? Splenomegaly 11/22/2013  ? ? ?REFERRING DIAG: R26.9 (ICD-10-CM) - Unspecified abnormalities of gait and mobility ? ?THERAPY DIAG:  ?Muscle weakness (generalized) ? ?Unsteadiness on feet ? ?History of falling ? ?PERTINENT HISTORY: Diabetes, HTN, Hyperlipidemia, hip level amputee on right ? ?PRECAUTIONS: fall risk, Rt hip level amputee, walker use ? ?SUBJECTIVE: I'm doing ok.  My left great toe is hurting today.   "Don't feel all that great" ? ?PAIN:  ?Are you having pain? No ? ?OBJECTIVE:  ? eval 11/22/21  Re-assessment on 12/28/21 ?LE MMT: ?  ?Amputee on right, Left generally 4+/5 ?  ?FUNCTIONAL TESTS:  ?5 times sit to stand: 23.98  (15.71 sec 12/28/21) ?Timed up and go (TUG): 19.79  (18.64 sec 12/28/21) ?  ?GAIT: ?Distance walked: 50 ?Assistive device utilized: Environmental consultant - 2 wheeled ?Level of assistance: Complete Independence ?Comments: good step length ?12/19/21:  ?TUG: 22 seconds  ?5X sit to stand: 19.91 seconds-UE support required  ? ? TODAY'S TREATMENT:  ?Treatment: ? ?01/04/2022 ?NuStep: Level 5 x 8  minutes (short rest break taken-3 min) (patient stated he "didn't feel right", denies  headache or chest pain, checked O2 and HR (96%, HR 90) PT had patient rest x 2 min, then took BP 126/59 HR 92, patient then resumed and completed 5 more minutes ?Seated toe and heel raises Lt only x 20 ?Seated LAQ Lt only x 20 added 6 lb ankle weight ?Seated hamstring curl with red band 2 x 20 ?Seated hip abd with red band x 20 ?Seated marching: 6 lb on left ankle 2x10 bil each ?Hip abduction and extension: standing 2x10 bil each with UE support- close guard by PT in parallel bars ?Hip adduction: seated with purple ball ?Gait training/walk test 280 feet with rolling walker without rest break ? ?01/02/2022 ?NuStep: Level 5 x 8  minutes (short rest break taken-610 steps) ?Seated toe and heel raises Lt only x 20 ?Seated LAQ Lt only x 20 added 6 lb ankle weight ?Seated hamstring curl with red band 2 x 20 ?Seated marching: 6 lb on left ankle 2x10 bil each ?Hip abduction and extension: standing 2x10 bil each with UE support- close guard by PT in parallel bars ?Hip adduction: seated with purple ball ?Gait training/walk test 280  feet with rolling walker without rest break ? ?12/28/2021 ?NuStep: Level 5 x 8  minutes (500 steps) (had bathroom break) ?10th visit Re-assessment completed: see above ?Seated toe and heel raises Lt only x 20 ?Seated LAQ Lt only x 20 added 6 lb ankle weight ?Seated hamstring curl with red band 2 x 20 ?Seated marching: 6 lb on left ankle 2x10 bil each ?Hip abduction: seated with band around thighs (green) ?Hip adduction: seated with purple ball ?Gait training/walk test 280 feet with rolling walker without rest break ? ? ?   ?  ?PATIENT EDUCATION:  ?Educated patient on various cardiac issues that diabetics often encounter.  Discussed s/s of infection and when to see [place] if right foot is not improving.   ?Education details: HEP ?Person educated: Patient ?Education method: Explanation, Demonstration, Verbal cues, and Handouts ?Education comprehension: verbalized understanding, returned demonstration,  verbal cues required, tactile cues required, and needs further education ? ?CLINICAL IMPRESSION: ?Mr. Torti continues to have some issues with the left foot and intermittent cardiac issues that limit his a

## 2022-01-08 DIAGNOSIS — E785 Hyperlipidemia, unspecified: Secondary | ICD-10-CM | POA: Diagnosis not present

## 2022-01-08 DIAGNOSIS — G8190 Hemiplegia, unspecified affecting unspecified side: Secondary | ICD-10-CM | POA: Diagnosis not present

## 2022-01-08 DIAGNOSIS — I10 Essential (primary) hypertension: Secondary | ICD-10-CM | POA: Diagnosis not present

## 2022-01-08 DIAGNOSIS — M79674 Pain in right toe(s): Secondary | ICD-10-CM | POA: Diagnosis not present

## 2022-01-08 DIAGNOSIS — I25709 Atherosclerosis of coronary artery bypass graft(s), unspecified, with unspecified angina pectoris: Secondary | ICD-10-CM | POA: Diagnosis not present

## 2022-01-08 DIAGNOSIS — D471 Chronic myeloproliferative disease: Secondary | ICD-10-CM | POA: Diagnosis not present

## 2022-01-08 DIAGNOSIS — E7800 Pure hypercholesterolemia, unspecified: Secondary | ICD-10-CM | POA: Diagnosis not present

## 2022-01-08 DIAGNOSIS — Z89619 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: Secondary | ICD-10-CM | POA: Diagnosis not present

## 2022-01-08 DIAGNOSIS — I739 Peripheral vascular disease, unspecified: Secondary | ICD-10-CM | POA: Diagnosis not present

## 2022-01-08 DIAGNOSIS — E1140 Type 2 diabetes mellitus with diabetic neuropathy, unspecified: Secondary | ICD-10-CM | POA: Diagnosis not present

## 2022-01-08 DIAGNOSIS — Z8673 Personal history of transient ischemic attack (TIA), and cerebral infarction without residual deficits: Secondary | ICD-10-CM | POA: Diagnosis not present

## 2022-01-09 ENCOUNTER — Ambulatory Visit: Payer: No Typology Code available for payment source | Attending: Internal Medicine

## 2022-01-09 DIAGNOSIS — R2681 Unsteadiness on feet: Secondary | ICD-10-CM | POA: Diagnosis not present

## 2022-01-09 DIAGNOSIS — Z9181 History of falling: Secondary | ICD-10-CM | POA: Diagnosis not present

## 2022-01-09 DIAGNOSIS — M6281 Muscle weakness (generalized): Secondary | ICD-10-CM | POA: Diagnosis not present

## 2022-01-09 NOTE — Therapy (Signed)
?OUTPATIENT PHYSICAL THERAPY TREATMENT NOTE ? ? ?Patient Name: Tyler Whitehead ?MRN: [id number] ?DOB:1941/03/30, 81 y.o., male ?Today's Date: 01/09/2022 ? ?PCP: Wenda Low, MD ?REFERRING PROVIDER: Wenda Low, MD ? ? PT End of Session - 01/09/22 1147   ? ? Visit Number 13   ? Date for PT Re-Evaluation 01/17/22   ? Authorization Type [employer]   ? Progress Note Due on Visit 20   ? PT Start Time 1104   ? PT Stop Time 1146   ? PT Time Calculation (min) 42 min   ? Activity Tolerance Patient tolerated treatment well   ? Behavior During Therapy [facility] for tasks assessed/performed   ? ?  ?  ? ?  ? ? ? ? ? ? ? ? ?Past Medical History:  ?Diagnosis Date  ? CAD (coronary artery disease)   ? a. S/p 3V CABG 2000 with LIMA-LAD, left radial-PDA, SVG-OM1. b. 2014: stent to SVG-OM1; then found to have obstruction distal to LAD anastamosis of LIMA s/p DES.   ? CHF (congestive heart failure) ([place])   ? Diverticular disease   ? DM (diabetes mellitus) ([place])   ? Hemorrhoid   ? HTN (hypertension)   ? Hyperlipidemia   ? Peripheral vascular disease ([place])   ? a. R femoropopliteal bypass ~2012 in [place], reportedly shown to be occluded. Now followed by Dr. [name].  ? 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[place])   ? RBBB   ? ?Past Surgical History:  ?Procedure Laterality Date  ? ABDOMINAL ANGIOGRAM N/A 01/18/2015  ? Procedure: ABDOMINAL ANGIOGRAM;  Surgeon: Serafina Mitchell, MD;  Location: [facility] CATH LAB;  Service: Cardiovascular;  Laterality: N/A;  ? ABDOMINAL AORTAGRAM N/A 09/21/2014  ? Procedure: ABDOMINAL AORTAGRAM;  Surgeon: Elam Dutch, MD;  Location: [facility] CATH LAB;  Service: Cardiovascular;  Laterality: N/A;  ? ABDOMINAL AORTOGRAM W/LOWER EXTREMITY N/A 03/28/2021  ? Procedure: ABDOMINAL AORTOGRAM W/LOWER EXTREMITY;  Surgeon: Serafina Mitchell, MD;  Location: [place] CV LAB;  Service: Cardiovascular;  Laterality: N/A;  ? CARDIAC CATHETERIZATION  May 2014  ? CORONARY ARTERY BYPASS GRAFT  2000  ? LM occluded, LIMA to LAD patent but distal native LAD  disease, saphenous vein bypass graft to OM with 95% stenosis in the midportion, right coronary artery occluded, radial artery graft to right coronary artery patent but was diffusely small. Marginal graft stented. 7 2014. Of note he had multiple previous stents in the right coronary artery. The distal LAD was treated with angioplasty. I  ? LOOP RECORDER IMPLANT N/A 07/12/2014  ? Procedure: LOOP RECORDER IMPLANT;  Surgeon: Evans Lance, MD;  Location: [facility] CATH LAB;  Service: Cardiovascular;  Laterality: N/A;  ? LOWER EXTREMITY ANGIOGRAM  09/21/2014  ? Procedure: LOWER EXTREMITY ANGIOGRAM;  Surgeon: Elam Dutch, MD;  Location: [facility] CATH LAB;  Service: Cardiovascular;;  ? PERCUTANEOUS CORONARY STENT INTERVENTION (PCI-S)  April 27 2013  ? right leg amputation  2017  ? TEE WITHOUT CARDIOVERSION N/A 07/12/2014  ? Procedure: TRANSESOPHAGEAL ECHOCARDIOGRAM (TEE);  Surgeon: Fay Records, MD;  Location: [place];  Service: Cardiovascular;  Laterality: N/A;  ? VEIN SURGERY    ? ?Patient Active Problem List  ? Diagnosis Date Noted  ? Status post above-knee amputation of right lower extremity ([place]) 09/08/2021  ? Atherosclerosis of native artery of left lower extremity with rest pain ([place]) 09/08/2021  ? 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 ([place]) 11/14/2020  ? Allergic rhinitis 11/14/2020  ? Chronic constipation 11/14/2020  ? Depression 11/14/2020  ? Diabetic  peripheral neuropathy associated with type 2 diabetes mellitus ([place]) 11/14/2020  ? Dysphagia 11/14/2020  ? Hyperlipidemia 11/14/2020  ? Leukocytosis 11/14/2020  ? Nausea and vomiting 11/14/2020  ? Presence of aortocoronary bypass graft 11/14/2020  ? Pure hypercholesterolemia 11/14/2020  ? Vitamin D deficiency 11/14/2020  ? 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 History of stroke 07/06/2015  ? Transient vision disturbance, right 07/06/2015  ? Atherosclerosis of native arteries of extremity with intermittent claudication ([place]) 09/27/2014  ? Localization-related  symptomatic epilepsy and epileptic syndromes with complex partial seizures, not intractable, without status epilepticus ([place]) 08/04/2014  ? Occipital stroke ([place]) 08/04/2014  ? Cerebral thrombosis with cerebral infarction ([place]) 07/10/2014  ? New onset seizure ([place]) 07/09/2014  ? Diabetic hyperosmolar non-ketotic state ([place]) 07/09/2014  ? Uncontrolled hypertension 07/09/2014  ? Polycythemia vera ([place]) 07/09/2014  ? Seizure ([place]) 07/09/2014  ? Visual field defect 07/09/2014  ? Headache 06/30/2014  ? HTN (hypertension) 11/23/2013  ? Dyslipidemia 11/23/2013  ? Myelofibrosis ([place]) 11/22/2013  ? Diverticular disease 11/22/2013  ? Hemorrhoids 11/22/2013  ? S/P CABG x 3 11/22/2013  ? CAD (coronary artery disease) 11/22/2013  ? Peripheral vascular disease ([place]) 11/22/2013  ? DM2 (diabetes mellitus, type 2) ([place]) 11/22/2013  ? Renal cyst, left 11/22/2013  ? Splenomegaly 11/22/2013  ? ? ?REFERRING DIAG: R26.9 (ICD-10-CM) - Unspecified abnormalities of gait and mobility ? ?THERAPY DIAG:  ?Muscle weakness (generalized) ? ?Unsteadiness on feet ? ?PERTINENT HISTORY: Diabetes, HTN, Hyperlipidemia, hip level amputee on right ? ?PRECAUTIONS: fall risk, Rt hip level amputee, walker use ? ?SUBJECTIVE: I saw the MD for my Lt foot.  I have an infection and will be taking antibiotics.  ? ?PAIN:  ?Are you having pain? No ? ?OBJECTIVE:  ? eval 11/22/21  Re-assessment on 12/28/21 ?LE MMT: ?  ?Amputee on right, Left generally 4+/5 ?  ?FUNCTIONAL TESTS:  ?5 times sit to stand: 23.98  (15.71 sec 12/28/21) ?Timed up and go (TUG): 19.79  (18.64 sec 12/28/21) ?  ?GAIT: ?Distance walked: 50 ?Assistive device utilized: Environmental consultant - 2 wheeled ?Level of assistance: Complete Independence ?Comments: good step length ?12/19/21:  ?TUG: 22 seconds  ?5X sit to stand: 19.91 seconds-UE support required  ? ? TODAY'S TREATMENT:  ?01/04/2022 ?NuStep: Level 5 x 8  minutes (short rest break taken-3 min) (patient stated he "didn't feel right", denies headache or chest pain,  checked O2 and HR (96%, HR 90) PT had patient rest x 2 min, then took BP 126/59 HR 92, patient then resumed and completed 5 more minutes ?Seated toe and heel raises Lt only x 20 ?Seated LAQ Lt only x 20 added 6 lb ankle weight ?Seated hamstring curl with green band 2 x 20 ?Seated hip abd with red band x 20 ?Seated marching: 6 lb on left ankle 2x10 bil each ?Hip abduction and extension: standing 2x10 bil each with UE support- close guard by PT in parallel bars ?Hip adduction: seated with purple ball ?Gait training/walk test 280 feet with rolling walker without rest break ? ?01/04/2022 ?NuStep: Level 5 x 8  minutes (short rest break taken-3 min) (patient stated he "didn't feel right", denies headache or chest pain, checked O2 and HR (96%, HR 90) PT had patient rest x 2 min, then took BP 126/59 HR 92, patient then resumed and completed 5 more minutes ?Seated toe and heel raises Lt only x 20 ?Seated LAQ Lt only x 20 added 6 lb ankle weight ?Seated hamstring curl with red band 2 x 20 ?Seated hip abd  with red band x 20 ?Seated marching: 6 lb on left ankle 2x10 bil each ?Hip abduction and extension: standing 2x10 bil each with UE support- close guard by PT in parallel bars ?Hip adduction: seated with purple ball ?Gait training/walk test 280 feet with rolling walker without rest break ? ?01/02/2022 ?NuStep: Level 5 x 8  minutes (short rest break taken-610 steps) ?Seated toe and heel raises Lt only x 20 ?Seated LAQ Lt only x 20 added 6 lb ankle weight ?Seated hamstring curl with red band 2 x 20 ?Seated marching: 6 lb on left ankle 2x10 bil each ?Hip abduction and extension: standing 2x10 bil each with UE support- close guard by PT in parallel bars ?Hip adduction: seated with purple ball ?Gait training/walk test 280 feet with rolling walker without rest break ? ? ? ? ?PATIENT EDUCATION:  ?Educated patient on various cardiac issues that diabetics often encounter.  Discussed s/s of infection and when to see MD if right foot is  not improving.   ?Education details: HEP ?Person educated: Patient ?Education method: Explanation, Demonstration, Verbal cues, and Handouts ?Education comprehension: verbalized understanding, returned demonstrat

## 2022-01-11 ENCOUNTER — Ambulatory Visit: Payer: No Typology Code available for payment source

## 2022-01-11 DIAGNOSIS — Z9181 History of falling: Secondary | ICD-10-CM

## 2022-01-11 DIAGNOSIS — M6281 Muscle weakness (generalized): Secondary | ICD-10-CM

## 2022-01-11 DIAGNOSIS — R2681 Unsteadiness on feet: Secondary | ICD-10-CM

## 2022-01-11 NOTE — Therapy (Signed)
?OUTPATIENT PHYSICAL THERAPY TREATMENT NOTE ? ? ?Patient Name: Tyler Whitehead ?MRN: [id number] ?DOB:09-Feb-1941, 81 y.o., male ?Today's Date: 01/11/2022 ? ?PCP: Wenda Low, MD ?REFERRING PROVIDER: Wenda Low, MD ? ? PT End of Session - 01/11/22 1126   ? ? Visit Number 14   ? Date for PT Re-Evaluation 01/17/22   ? Authorization Type [employer]   ? Progress Note Due on Visit 20   ? PT Start Time 1100   ? PT Stop Time 1145   ? PT Time Calculation (min) 45 min   ? Activity Tolerance Patient tolerated treatment well   ? Behavior During Therapy [facility] for tasks assessed/performed   ? ?  ?  ? ?  ? ? ? ? ? ? ? ? ?Past Medical History:  ?Diagnosis Date  ? CAD (coronary artery disease)   ? a. S/p 3V CABG 2000 with LIMA-LAD, left radial-PDA, SVG-OM1. b. 2014: stent to SVG-OM1; then found to have obstruction distal to LAD anastamosis of LIMA s/p DES.   ? CHF (congestive heart failure) ([place])   ? Diverticular disease   ? DM (diabetes mellitus) ([place])   ? Hemorrhoid   ? HTN (hypertension)   ? Hyperlipidemia   ? Peripheral vascular disease ([place])   ? a. R femoropopliteal bypass ~2012 in [place], reportedly shown to be occluded. Now followed by Dr. [name].  ? 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[place])   ? RBBB   ? ?Past Surgical History:  ?Procedure Laterality Date  ? ABDOMINAL ANGIOGRAM N/A 01/18/2015  ? Procedure: ABDOMINAL ANGIOGRAM;  Surgeon: Serafina Mitchell, MD;  Location: [facility] CATH LAB;  Service: Cardiovascular;  Laterality: N/A;  ? ABDOMINAL AORTAGRAM N/A 09/21/2014  ? Procedure: ABDOMINAL AORTAGRAM;  Surgeon: Elam Dutch, MD;  Location: [facility] CATH LAB;  Service: Cardiovascular;  Laterality: N/A;  ? ABDOMINAL AORTOGRAM W/LOWER EXTREMITY N/A 03/28/2021  ? Procedure: ABDOMINAL AORTOGRAM W/LOWER EXTREMITY;  Surgeon: Serafina Mitchell, MD;  Location: [facility] CV LAB;  Service: Cardiovascular;  Laterality: N/A;  ? CARDIAC CATHETERIZATION  May 2014  ? CORONARY ARTERY BYPASS GRAFT  2000  ? LM occluded, LIMA to LAD patent but distal native LAD  disease, saphenous vein bypass graft to OM with 95% stenosis in the midportion, right coronary artery occluded, radial artery graft to right coronary artery patent but was diffusely small. Marginal graft stented. 7 2014. Of note he had multiple previous stents in the right coronary artery. The distal LAD was treated with angioplasty. I  ? LOOP RECORDER IMPLANT N/A 07/12/2014  ? Procedure: LOOP RECORDER IMPLANT;  Surgeon: Evans Lance, MD;  Location: [facility] CATH LAB;  Service: Cardiovascular;  Laterality: N/A;  ? LOWER EXTREMITY ANGIOGRAM  09/21/2014  ? Procedure: LOWER EXTREMITY ANGIOGRAM;  Surgeon: Elam Dutch, MD;  Location: [facility] CATH LAB;  Service: Cardiovascular;;  ? PERCUTANEOUS CORONARY STENT INTERVENTION (PCI-S)  April 27 2013  ? right leg amputation  2017  ? TEE WITHOUT CARDIOVERSION N/A 07/12/2014  ? Procedure: TRANSESOPHAGEAL ECHOCARDIOGRAM (TEE);  Surgeon: Fay Records, MD;  Location: [place];  Service: Cardiovascular;  Laterality: N/A;  ? VEIN SURGERY    ? ?Patient Active Problem List  ? Diagnosis Date Noted  ? Status post above-knee amputation of right lower extremity ([place]) 09/08/2021  ? Atherosclerosis of native artery of left lower extremity with rest pain ([place]) 09/08/2021  ? 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 ([place]) 11/14/2020  ? Allergic rhinitis 11/14/2020  ? Chronic constipation 11/14/2020  ? Depression 11/14/2020  ? Diabetic  peripheral neuropathy associated with type 2 diabetes mellitus ([place]) 11/14/2020  ? Dysphagia 11/14/2020  ? Hyperlipidemia 11/14/2020  ? Leukocytosis 11/14/2020  ? Nausea and vomiting 11/14/2020  ? Presence of aortocoronary bypass graft 11/14/2020  ? Pure hypercholesterolemia 11/14/2020  ? Vitamin D deficiency 11/14/2020  ? Cryptogenic stroke ([place]) 07/20/2015  ? History of stroke 07/06/2015  ? Transient vision disturbance, right 07/06/2015  ? Atherosclerosis of native arteries of extremity with intermittent claudication ([place]) 09/27/2014  ? Localization-related  symptomatic epilepsy and epileptic syndromes with complex partial seizures, not intractable, without status epilepticus ([place]) 08/04/2014  ? Occipital stroke ([place]) 08/04/2014  ? Cerebral thrombosis with cerebral infarction ([place]) 07/10/2014  ? New onset seizure ([place]) 07/09/2014  ? Diabetic hyperosmolar non-ketotic state ([place]) 07/09/2014  ? Uncontrolled hypertension 07/09/2014  ? Polycythemia vera ([place]) 07/09/2014  ? Seizure ([place]) 07/09/2014  ? Visual field defect 07/09/2014  ? Headache 06/30/2014  ? HTN (hypertension) 11/23/2013  ? Dyslipidemia 11/23/2013  ? Myelofibrosis ([place]) 11/22/2013  ? Diverticular disease 11/22/2013  ? Hemorrhoids 11/22/2013  ? S/P CABG x 3 11/22/2013  ? CAD (coronary artery disease) 11/22/2013  ? Peripheral vascular disease ([place]) 11/22/2013  ? DM2 (diabetes mellitus, type 2) ([place]) 11/22/2013  ? Renal cyst, left 11/22/2013  ? Splenomegaly 11/22/2013  ? ? ?REFERRING DIAG: R26.9 (ICD-10-CM) - Unspecified abnormalities of gait and mobility ? ?THERAPY DIAG:  ?Muscle weakness (generalized) ? ?Unsteadiness on feet ? ?History of falling ? ?PERTINENT HISTORY: Diabetes, HTN, Hyperlipidemia, hip level amputee on right ? ?PRECAUTIONS: fall risk, Rt hip level amputee, walker use ? ?SUBJECTIVE: Patient states he started antibiotics for his left great toe.  He explains that it still hurts.  "Not much change"  I used some apple cider vinegar and salve on it.   ? ?PAIN:  ?Are you having pain? No ? ?[place]  ?[address] ?[place] [address].  ?[phone]   ?OBJECTIVE:  ? eval 11/22/21  Re-assessment on 12/28/21 ?LE MMT: ?  ?Amputee on right, Left generally 4+/5 ?  ?FUNCTIONAL TESTS:  ?5 times sit to stand: 23.98  (15.71 sec 12/28/21) ?Timed up and go (TUG): 19.79  (18.64 sec 12/28/21) ?  ?GAIT: ?Distance walked: 50 ?Assistive device utilized: Environmental consultant - 2 wheeled ?Level of assistance: Complete Independence ?Comments: good step length ?12/19/21:  ?TUG: 22 seconds  ?5X sit to  stand: 19.91 seconds-UE support required  ? ? TODAY'S TREATMENT:  ?01/11/2022 ?NuStep: Level 5 x 8  minutes  ?Spent as much as 20 min discussing patient self directing his diabetes management and wound care on left toe.  Patient encouraged to be open with physicians about how he is managing his diabetes (not taking insulin, using agents for wound care that could interfere with healing, etc.) ?Seated toe and heel raises Lt only x 20 ?Seated LAQ Lt only x 20 added 6 lb ankle weight ?Seated hamstring curl with green band 2 x 20 ?Seated hip abd with red band x 20 ?Seated marching: 6 lb on left ankle 2x10 bil each ?Hip adduction: seated with purple ball ? ?01/04/2022 ?NuStep: Level 5 x 8  minutes (short rest break taken-3 min) (patient stated he "didn't feel right", denies headache or chest pain, checked O2 and HR (96%, HR 90) PT had patient rest x 2 min, then took BP 126/59 HR 92, patient then resumed and completed 5 more minutes ?Seated toe and heel raises Lt only x 20 ?Seated LAQ Lt only x 20 added 6 lb ankle weight ?Seated  hamstring curl with green band 2 x 20 ?Seated hip abd with red band x 20 ?Seated marching: 6 lb on left ankle 2x10 bil each ?Hip abduction and extension: standing 2x10 bil each with UE support- close guard by PT in parallel bars ?Hip adduction: seated with purple ball ?Gait training/walk test 280 feet with rolling walker without rest break ? ?01/04/2022 ?NuStep: Level 5 x 8  minutes (short rest break taken-3 min) (patient stated he "didn't feel right", denies headache or chest pain, checked O2 and HR (96%, HR 90) PT had patient rest x 2 min, then took BP 126/59 HR 92, patient then resumed and completed 5 more minutes ?Seated toe and heel raises Lt only x 20 ?Seated LAQ Lt only x 20 added 6 lb ankle weight ?Seated hamstring curl with red band 2 x 20 ?Seated hip abd with red band x 20 ?Seated marching: 6 lb on left ankle 2x10 bil each ?Hip abduction and extension: standing 2x10 bil each with UE support-  close guard by PT in parallel bars ?Hip adduction: seated with purple ball ?Gait training/walk test 280 feet with rolling walker without rest break ? ?01/02/2022 ?NuStep: Level 5 x 8  minutes (short rest break tak

## 2022-01-15 ENCOUNTER — Ambulatory Visit: Payer: No Typology Code available for payment source | Admitting: Physical Therapy

## 2022-01-15 ENCOUNTER — Encounter: Payer: Self-pay | Admitting: Physical Therapy

## 2022-01-15 DIAGNOSIS — Z9181 History of falling: Secondary | ICD-10-CM

## 2022-01-15 DIAGNOSIS — M6281 Muscle weakness (generalized): Secondary | ICD-10-CM

## 2022-01-15 DIAGNOSIS — R2681 Unsteadiness on feet: Secondary | ICD-10-CM

## 2022-01-15 NOTE — Therapy (Signed)
?OUTPATIENT PHYSICAL THERAPY TREATMENT NOTE ? ? ?Patient Name: Tyler Whitehead ?MRN: [id number] ?DOB:September 25, 1941, 81 y.o., male ?Today's Date: 01/15/2022 ? ?PCP: Wenda Low, MD ?REFERRING PROVIDER: Wenda Low, MD ? ? PT End of Session - 01/15/22 1222   ? ? Visit Number 15   ? Date for PT Re-Evaluation 01/17/22   ? Authorization Type [employer]   ? Progress Note Due on Visit 20   ? PT Start Time 1218   ? PT Stop Time 1258   ? PT Time Calculation (min) 40 min   ? Activity Tolerance Patient tolerated treatment well   ? Behavior During Therapy [facility] for tasks assessed/performed   ? ?  ?  ? ?  ? ? ? ? ? ? ? ? ? ?Past Medical History:  ?Diagnosis Date  ? CAD (coronary artery disease)   ? a. S/p 3V CABG 2000 with LIMA-LAD, left radial-PDA, SVG-OM1. b. 2014: stent to SVG-OM1; then found to have obstruction distal to LAD anastamosis of LIMA s/p DES.   ? CHF (congestive heart failure) ([place])   ? Diverticular disease   ? DM (diabetes mellitus) ([place])   ? Hemorrhoid   ? HTN (hypertension)   ? Hyperlipidemia   ? Peripheral vascular disease ([place])   ? a. R femoropopliteal bypass ~2012 in [place], reportedly shown to be occluded. Now followed by Dr. [name].  ? 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[place])   ? RBBB   ? ?Past Surgical History:  ?Procedure Laterality Date  ? ABDOMINAL ANGIOGRAM N/A 01/18/2015  ? Procedure: ABDOMINAL ANGIOGRAM;  Surgeon: Serafina Mitchell, MD;  Location: [facility] CATH LAB;  Service: Cardiovascular;  Laterality: N/A;  ? ABDOMINAL AORTAGRAM N/A 09/21/2014  ? Procedure: ABDOMINAL AORTAGRAM;  Surgeon: Elam Dutch, MD;  Location: [facility] CATH LAB;  Service: Cardiovascular;  Laterality: N/A;  ? ABDOMINAL AORTOGRAM W/LOWER EXTREMITY N/A 03/28/2021  ? Procedure: ABDOMINAL AORTOGRAM W/LOWER EXTREMITY;  Surgeon: Serafina Mitchell, MD;  Location: [place] CV LAB;  Service: Cardiovascular;  Laterality: N/A;  ? CARDIAC CATHETERIZATION  May 2014  ? CORONARY ARTERY BYPASS GRAFT  2000  ? LM occluded, LIMA to LAD patent but distal native LAD  disease, saphenous vein bypass graft to OM with 95% stenosis in the midportion, right coronary artery occluded, radial artery graft to right coronary artery patent but was diffusely small. Marginal graft stented. 7 2014. Of note he had multiple previous stents in the right coronary artery. The distal LAD was treated with angioplasty. I  ? LOOP RECORDER IMPLANT N/A 07/12/2014  ? Procedure: LOOP RECORDER IMPLANT;  Surgeon: Evans Lance, MD;  Location: [facility] CATH LAB;  Service: Cardiovascular;  Laterality: N/A;  ? LOWER EXTREMITY ANGIOGRAM  09/21/2014  ? Procedure: LOWER EXTREMITY ANGIOGRAM;  Surgeon: Elam Dutch, MD;  Location: [facility] CATH LAB;  Service: Cardiovascular;;  ? PERCUTANEOUS CORONARY STENT INTERVENTION (PCI-S)  April 27 2013  ? right leg amputation  2017  ? TEE WITHOUT CARDIOVERSION N/A 07/12/2014  ? Procedure: TRANSESOPHAGEAL ECHOCARDIOGRAM (TEE);  Surgeon: Fay Records, MD;  Location: [place];  Service: Cardiovascular;  Laterality: N/A;  ? VEIN SURGERY    ? ?Patient Active Problem List  ? Diagnosis Date Noted  ? Status post above-knee amputation of right lower extremity ([place]) 09/08/2021  ? Atherosclerosis of native artery of left lower extremity with rest pain ([place]) 09/08/2021  ? 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 ([place]) 11/14/2020  ? Allergic rhinitis 11/14/2020  ? Chronic constipation 11/14/2020  ? Depression 11/14/2020  ?  Diabetic peripheral neuropathy associated with type 2 diabetes mellitus ([place]) 11/14/2020  ? Dysphagia 11/14/2020  ? Hyperlipidemia 11/14/2020  ? Leukocytosis 11/14/2020  ? Nausea and vomiting 11/14/2020  ? Presence of aortocoronary bypass graft 11/14/2020  ? Pure hypercholesterolemia 11/14/2020  ? Vitamin D deficiency 11/14/2020  ? 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 History of stroke 07/06/2015  ? Transient vision disturbance, right 07/06/2015  ? Atherosclerosis of native arteries of extremity with intermittent claudication ([place]) 09/27/2014  ? Localization-related  symptomatic epilepsy and epileptic syndromes with complex partial seizures, not intractable, without status epilepticus ([place]) 08/04/2014  ? Occipital stroke ([place]) 08/04/2014  ? Cerebral thrombosis with cerebral infarction ([place]) 07/10/2014  ? New onset seizure ([place]) 07/09/2014  ? Diabetic hyperosmolar non-ketotic state ([place]) 07/09/2014  ? Uncontrolled hypertension 07/09/2014  ? Polycythemia vera ([place]) 07/09/2014  ? Seizure ([place]) 07/09/2014  ? Visual field defect 07/09/2014  ? Headache 06/30/2014  ? HTN (hypertension) 11/23/2013  ? Dyslipidemia 11/23/2013  ? Myelofibrosis ([place]) 11/22/2013  ? Diverticular disease 11/22/2013  ? Hemorrhoids 11/22/2013  ? S/P CABG x 3 11/22/2013  ? CAD (coronary artery disease) 11/22/2013  ? Peripheral vascular disease ([place]) 11/22/2013  ? DM2 (diabetes mellitus, type 2) ([place]) 11/22/2013  ? Renal cyst, left 11/22/2013  ? Splenomegaly 11/22/2013  ? ? ?REFERRING DIAG: R26.9 (ICD-10-CM) - Unspecified abnormalities of gait and mobility ? ?THERAPY DIAG:  ?Muscle weakness (generalized) ? ?Unsteadiness on feet ? ?History of falling ? ?PERTINENT HISTORY: Diabetes, HTN, Hyperlipidemia, hip level amputee on right ? ?PRECAUTIONS: fall risk, Rt hip level amputee, walker use ? ?SUBJECTIVE:  Patient states he is feeling not as good on the medicine but he will see the doctor tomorrow and as him about the dose.  Pt reports he is doing great with walking and feels good doing the exercises at home ? ?PAIN:  ?Are you having pain? No ? ?[facility]  ?[address] ?[place] [address].  ?[phone]   ?OBJECTIVE:  ? eval 11/22/21  Re-assessment on 12/28/21 and 01/15/22 ?LE MMT: ?  ?Amputee on right, Left generally 4+/5 ?  ?FUNCTIONAL TESTS:  ?5 times sit to stand: 23.98  (15.71 sec 12/28/21) (14 sec 01/15/22) ?Timed up and go (TUG): 19.79  (18.64 sec 12/28/21) (22 sec 01/15/22) ?  ?GAIT: ?Distance walked: 50 ?Assistive device utilized: Environmental consultant - 2 wheeled ?Level of assistance:  Complete Independence ?Comments: good step length ?12/19/21:  ?TUG: 22 seconds  ?5X sit to stand: 19.91 seconds-UE support required  ? ? TODAY'S TREATMENT:  ?01/15/2022 ?NuStep: Level 5 x 6  minutes (500 steps) ?Seated toe and heel raises Lt only x 20 ?Seated LAQ Lt only x 20 added 6 lb ankle weight ?Seated hamstring curl with green band 2 x 20 ?Seated hip abd with red band x 20 ?Seated marching: 6 lb on left ankle 2x10 bil each ?Hip adduction: seated with purple ball ?Hip abduction and extension: standing 2x10 bil each with UE support- close guard by PT in parallel bars ? ? ?01/11/2022 ?NuStep: Level 5 x 8  minutes  ?Spent as much as 20 min discussing patient self directing his diabetes management and wound care on left toe.  Patient encouraged to be open with physicians about how he is managing his diabetes (not taking insulin, using agents for wound care that could interfere with healing, etc.) ?Seated toe and heel raises Lt only x 20 ?Seated LAQ Lt only x 20 added 6 lb ankle weight ?Seated hamstring curl with green band 2  x 20 ?Seated hip abd with red band x 20 ?Seated marching: 6 lb on left ankle 2x10 bil each ?Hip adduction: seated with purple ball ? ?01/04/2022 ?NuStep: Level 5 x 8  minutes (short rest break taken-3 min) (patient stated he "didn't feel right", denies headache or chest pain, checked O2 and HR (96%, HR 90) PT had patient rest x 2 min, then took BP 126/59 HR 92, patient then resumed and completed 5 more minutes ?Seated toe and heel raises Lt only x 20 ?Seated LAQ Lt only x 20 added 6 lb ankle weight ?Seated hamstring curl with green band 2 x 20 ?Seated hip abd with red band x 20 ?Seated marching: 6 lb on left ankle 2x10 bil each ?Hip abduction and extension: standing 2x10 bil each with UE support- close guard by PT in parallel bars ?Hip adduction: seated with purple ball ?Gait training/walk test 280 feet with rolling walker without rest break ? ?01/04/2022 ?NuStep: Level 5 x 8  minutes (short rest  break taken-3 min) (patient stated he "didn't feel right", denies headache or chest pain, checked O2 and HR (96%, HR 90) PT had patient rest x 2 min, then took BP 126/59 HR 92, patient then resumed and completed 5 mo

## 2022-01-16 ENCOUNTER — Encounter: Payer: Self-pay | Admitting: Pharmacist Clinician (PhC)/ Clinical Pharmacy Specialist

## 2022-01-16 ENCOUNTER — Ambulatory Visit (INDEPENDENT_AMBULATORY_CARE_PROVIDER_SITE_OTHER)
Payer: No Typology Code available for payment source | Admitting: Pharmacist Clinician (PhC)/ Clinical Pharmacy Specialist

## 2022-01-16 ENCOUNTER — Ambulatory Visit: Payer: No Typology Code available for payment source

## 2022-01-16 DIAGNOSIS — I25118 Atherosclerotic heart disease of native coronary artery with other forms of angina pectoris: Secondary | ICD-10-CM

## 2022-01-16 MED ORDER — ISOSORBIDE MONONITRATE ER 60 MG PO TB24: 120.0000 mg | ORAL_TABLET | Freq: Every day | ORAL | 2 refills | Status: DC

## 2022-01-16 NOTE — Patient Instructions (Signed)
?  Take your meds as follows: ? Switch isosorbide 60 mg to 2 tablets once daily ? Take carvedilol 6.25 mg twice daily (you can use 2 of the 3.125 mg tablets twice daily) ? ?Bring all of your meds, your BP cuff and your record of home blood pressures to your next appointment.  Exercise as you?re able, try to walk approximately 30 minutes per day.  Keep salt intake to a minimum, especially watch canned and prepared boxed foods.  Eat more fresh fruits and vegetables and fewer canned items.  Avoid eating in fast food restaurants.  ? ? HOW TO TAKE YOUR BLOOD PRESSURE: ?Rest 5 minutes before taking your blood pressure. ? Don?t smoke or drink caffeinated beverages for at least 30 minutes before. ?Take your blood pressure before (not after) you eat. ?Sit comfortably with your back supported and both feet on the floor (don?t cross your legs). ?Elevate your arm to heart level on a table or a desk. ?Use the proper sized cuff. It should fit smoothly and snugly around your bare upper arm. There should be enough room to slip a fingertip under the cuff. The bottom edge of the cuff should be 1 inch above the crease of the elbow. ?Ideally, take 3 measurements at one sitting and record the average. ? ? ?

## 2022-01-16 NOTE — Assessment & Plan Note (Signed)
Patient with CAD in today for medication reconciliation.  Reviewed all medications, focusing specifically on carvedilol and isosorbide.  Wrote clear instructions on bottles, to be sure he is taking carvedilol 6.25 mg bid and isosorbide mono 120 mg once daily.  Explained that isosorbide should only be once daily for best results (needs to have a nitrate free period).  Patient noted he had a few other duplicate bottles of medication at home, so explained that he needed to match name and strength to current regimen.  If there is confusion he can call or throw away the un-matched medication.   ?

## 2022-01-16 NOTE — Progress Notes (Signed)
? ? ? ?01/16/2022 ?Tyler Whitehead ?07/19/1941 ?[id number] ? ? ?HPI:  Tyler Whitehead is a 81 y.o. male patient of Dr Percival Spanish, with a PMH below who presents today for management of medications for CAD/CABG.  Patient was seen by Dr. Percival Spanish last month and noted to still have exertional chest pain.  There was confusion on what dose of Imdur he takes.  Dr. Percival Spanish asked that he come in with all medication bottles so that we could better assess what he is taking.  Would like to see him on Imdur 120 mg qd and carvediilol 6.25 mg bid.   He is here today with his wife.  He brought all medication bottles.  In reviewing medications he notes only using the Nitrostat twice in the past month.   ? ?Past Medical History: ?CAD S/P CABG - still having some exertional CP, on Imdur, carvedilol, losartan  ?HTN Controlled on carvedilol and losartan  ?DM2 1/23 A1c 7 on Lantus and Humalog  ?HLD 1/23 LDL 51 on atorvastatin 40  ?PAD AKA on right leg, has scars on left indicating prior PV surgery  ?  ? ?Blood Pressure Goal:  130/80 ? ?Current Medications:see medication list - reconciled in the office today ? ?Intolerances: nkda ? ?Labs: 3/23:  Na 136, K 4.7, Glu 281, BUN 17, SCr 1.05, GFR > 60 ? ? ?Wt Readings from Last 3 Encounters:  ?12/28/21 167 lb 12.8 oz (76.1 kg)  ?11/02/21 170 lb 12.8 oz (77.5 kg)  ?08/07/21 170 lb 9.6 oz (77.4 kg)  ? ?BP Readings from Last 3 Encounters:  ?01/16/22 128/64  ?12/28/21 136/66  ?12/21/21 (!) 147/70  ? ?Pulse Readings from Last 3 Encounters:  ?01/16/22 80  ?12/28/21 77  ?12/21/21 84  ? ? ?Current Outpatient Medications  ?Medication Sig Dispense Refill  ? aspirin 81 MG chewable tablet Chew 81 mg by mouth daily.    ? atorvastatin (LIPITOR) 40 MG tablet Take 40 mg by mouth at bedtime.    ? carvedilol (COREG) 6.25 MG tablet Take 1 tablet (6.25 mg total) by mouth 2 (two) times daily with a meal. 180 tablet 3  ? cephALEXin (KEFLEX) 500 MG capsule Take 500 mg by mouth 4 (four) times daily.    ? cilostazol  (PLETAL) 100 MG tablet Take 100 mg by mouth 2 (two) times daily.    ? fluticasone (FLONASE) 50 MCG/ACT nasal spray 1 spray in each nostril    ? hydroxyurea (HYDREA) 500 MG capsule Take 2 capsules in morning and 1 capsule in evening (Patient taking differently: Take 2 capsules in morning.) 90 capsule 3  ? insulin glargine (LANTUS) 100 UNIT/ML injection Inject 10 Units into the skin at bedtime.    ? insulin lispro (HUMALOG) 100 UNIT/ML injection Inject 4 Units into the skin daily as needed for high blood sugar.    ? isosorbide mononitrate (IMDUR) 60 MG 24 hr tablet Take 2 tablets (120 mg total) by mouth daily. 180 tablet 2  ? ketoconazole (NIZORAL) 2 % cream Apply 1 application topically daily. 60 g 2  ? Lancets (ONETOUCH DELICA PLUS [id number]) MISC Apply topically 2 (two) times daily.    ? levocetirizine (XYZAL) 5 MG tablet Take 5 mg by mouth every evening.    ? losartan (COZAAR) 25 MG tablet Take 25 mg by mouth at bedtime.    ? nitroGLYCERIN (NITROSTAT) 0.4 MG SL tablet Place 1 tablet (0.4 mg total) under the tongue every 5 (five) minutes as needed for chest pain. 25 tablet 3  ?  ONETOUCH VERIO test strip 1 each 2 (two) times daily.    ? PROAIR HFA 108 (90 BASE) MCG/ACT inhaler Inhale 1 puff into the lungs every 4 (four) hours as needed for wheezing or shortness of breath.     ? ?No current facility-administered medications for this visit.  ? ? ?Allergies  ?Allergen Reactions  ? Other   ?  Other reaction(s): Unknown  ? ? ?Past Medical History:  ?Diagnosis Date  ? CAD (coronary artery disease)   ? a. S/p 3V CABG 2000 with LIMA-LAD, left radial-PDA, SVG-OM1. b. 2014: stent to SVG-OM1; then found to have obstruction distal to LAD anastamosis of LIMA s/p DES.   ? CHF (congestive heart failure) ([place])   ? Diverticular disease   ? DM (diabetes mellitus) ([place])   ? Hemorrhoid   ? HTN (hypertension)   ? Hyperlipidemia   ? Peripheral vascular disease ([place])   ? a. R femoropopliteal bypass ~2012 in [place], reportedly shown to be  occluded. Now followed by Dr. [name].  ? 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[place])   ? RBBB   ? ? ?Blood pressure 128/64, pulse 80. ? ?CAD (coronary artery disease) ?Patient with CAD in today for medication reconciliation.  Reviewed all medications, focusing specifically on carvedilol and isosorbide.  Wrote clear instructions on bottles, to be sure he is taking carvedilol 6.25 mg bid and isosorbide mono 120 mg once daily.  Explained that isosorbide should only be once daily for best results (needs to have a nitrate free period).  Patient noted he had a few other duplicate bottles of medication at home, so explained that he needed to match name and strength to current regimen.  If there is confusion he can call or throw away the un-matched medication.   ? ? ?Tommy Medal PharmD CPP [facility] ?[place] ?[address] ?[place], [place] [address] ?[phone] ?

## 2022-02-01 ENCOUNTER — Inpatient Hospital Stay: Payer: No Typology Code available for payment source | Attending: Nurse Practitioner

## 2022-02-01 DIAGNOSIS — D7581 Myelofibrosis: Secondary | ICD-10-CM

## 2022-02-01 DIAGNOSIS — D471 Chronic myeloproliferative disease: Secondary | ICD-10-CM | POA: Insufficient documentation

## 2022-02-01 LAB — CBC WITH DIFFERENTIAL (CANCER CENTER ONLY)
Abs Immature Granulocytes: 0.07 10*3/uL (ref 0.00–0.07)
Basophils Absolute: 0.1 10*3/uL (ref 0.0–0.1)
Basophils Relative: 1 %
Eosinophils Absolute: 0.3 10*3/uL (ref 0.0–0.5)
Eosinophils Relative: 3 %
HCT: 36 % — ABNORMAL LOW (ref 39.0–52.0)
Hemoglobin: 11.6 g/dL — ABNORMAL LOW (ref 13.0–17.0)
Immature Granulocytes: 1 %
Lymphocytes Relative: 6 %
Lymphs Abs: 0.6 10*3/uL — ABNORMAL LOW (ref 0.7–4.0)
MCH: 33.4 pg (ref 26.0–34.0)
MCHC: 32.2 g/dL (ref 30.0–36.0)
MCV: 103.7 fL — ABNORMAL HIGH (ref 80.0–100.0)
Monocytes Absolute: 0.5 10*3/uL (ref 0.1–1.0)
Monocytes Relative: 5 %
Neutro Abs: 8.5 10*3/uL — ABNORMAL HIGH (ref 1.7–7.7)
Neutrophils Relative %: 84 %
Platelet Count: 467 10*3/uL — ABNORMAL HIGH (ref 150–400)
RBC: 3.47 MIL/uL — ABNORMAL LOW (ref 4.22–5.81)
RDW: 19.9 % — ABNORMAL HIGH (ref 11.5–15.5)
WBC Count: 10.1 10*3/uL (ref 4.0–10.5)
nRBC: 0 % (ref 0.0–0.2)

## 2022-02-01 LAB — CMP (CANCER CENTER ONLY)
ALT: 9 U/L (ref 0–44)
AST: 12 U/L — ABNORMAL LOW (ref 15–41)
Albumin: 3.7 g/dL (ref 3.5–5.0)
Alkaline Phosphatase: 70 U/L (ref 38–126)
Anion gap: 5 (ref 5–15)
BUN: 18 mg/dL (ref 8–23)
CO2: 30 mmol/L (ref 22–32)
Calcium: 9 mg/dL (ref 8.9–10.3)
Chloride: 106 mmol/L (ref 98–111)
Creatinine: 1.16 mg/dL (ref 0.61–1.24)
GFR, Estimated: >60 mL/min (ref >60)
Glucose, Bld: 183 mg/dL — ABNORMAL HIGH (ref 70–99)
Potassium: 4.5 mmol/L (ref 3.5–5.1)
Sodium: 141 mmol/L (ref 135–145)
Total Bilirubin: 0.5 mg/dL (ref 0.3–1.2)
Total Protein: 6.1 g/dL — ABNORMAL LOW (ref 6.5–8.1)

## 2022-02-05 ENCOUNTER — Telehealth: Payer: Self-pay

## 2022-02-05 NOTE — Telephone Encounter (Signed)
Spoke w/pt via telephone regarding recent lab results.  Informed pt that his platelet levels are good and Dr. Burr Medico would like for him to continue his current dose Hydrea.  Pt verbalized understanding and had no further questions or concerns at this time. ?

## 2022-02-15 ENCOUNTER — Other Ambulatory Visit: Payer: Self-pay | Admitting: Internal Medicine

## 2022-02-15 DIAGNOSIS — Z8673 Personal history of transient ischemic attack (TIA), and cerebral infarction without residual deficits: Secondary | ICD-10-CM | POA: Diagnosis not present

## 2022-02-15 DIAGNOSIS — R251 Tremor, unspecified: Secondary | ICD-10-CM | POA: Diagnosis not present

## 2022-02-15 DIAGNOSIS — R413 Other amnesia: Secondary | ICD-10-CM

## 2022-02-15 DIAGNOSIS — R69 Illness, unspecified: Secondary | ICD-10-CM | POA: Diagnosis not present

## 2022-02-15 DIAGNOSIS — Z89619 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: Secondary | ICD-10-CM | POA: Diagnosis not present

## 2022-02-20 ENCOUNTER — Ambulatory Visit (INDEPENDENT_AMBULATORY_CARE_PROVIDER_SITE_OTHER): Payer: No Typology Code available for payment source | Admitting: Podiatry

## 2022-02-20 DIAGNOSIS — R413 Other amnesia: Secondary | ICD-10-CM | POA: Insufficient documentation

## 2022-02-20 DIAGNOSIS — E1142 Type 2 diabetes mellitus with diabetic polyneuropathy: Secondary | ICD-10-CM

## 2022-02-20 DIAGNOSIS — Z89611 Acquired absence of right leg above knee: Secondary | ICD-10-CM

## 2022-02-20 DIAGNOSIS — M79675 Pain in left toe(s): Secondary | ICD-10-CM | POA: Diagnosis not present

## 2022-02-20 DIAGNOSIS — M79674 Pain in right toe(s): Secondary | ICD-10-CM

## 2022-02-20 DIAGNOSIS — B351 Tinea unguium: Secondary | ICD-10-CM

## 2022-02-20 DIAGNOSIS — G8191 Hemiplegia, unspecified affecting right dominant side: Secondary | ICD-10-CM | POA: Insufficient documentation

## 2022-02-20 DIAGNOSIS — R269 Unspecified abnormalities of gait and mobility: Secondary | ICD-10-CM | POA: Insufficient documentation

## 2022-02-22 ENCOUNTER — Ambulatory Visit: Payer: No Typology Code available for payment source

## 2022-02-22 DIAGNOSIS — E1151 Type 2 diabetes mellitus with diabetic peripheral angiopathy without gangrene: Secondary | ICD-10-CM

## 2022-02-22 NOTE — Progress Notes (Signed)
Patient seen to check up on shoes and insoles, paperwork is not ready at this time and nothing has been released from fabrication. Overrode order of operations and released mold from fabrication. Ordered patient's A6000M 11.5W and resubmitted CMN. Patient to be contacted when equipment is ready.

## 2022-02-25 ENCOUNTER — Encounter: Payer: Self-pay | Admitting: Podiatry

## 2022-02-25 ENCOUNTER — Other Ambulatory Visit (INDEPENDENT_AMBULATORY_CARE_PROVIDER_SITE_OTHER): Payer: No Typology Code available for payment source | Admitting: Podiatry

## 2022-02-25 DIAGNOSIS — I739 Peripheral vascular disease, unspecified: Secondary | ICD-10-CM

## 2022-02-25 DIAGNOSIS — E1142 Type 2 diabetes mellitus with diabetic polyneuropathy: Secondary | ICD-10-CM

## 2022-02-25 DIAGNOSIS — Z89611 Acquired absence of right leg above knee: Secondary | ICD-10-CM

## 2022-02-25 NOTE — Progress Notes (Addendum)
  Subjective:  Patient ID: Tyler Whitehead, male    DOB: Dec 09, 1940,  MRN: [id number]  Tyler Whitehead presents to clinic today for at risk foot care. Patient has h/o amputation of above knee amputation right lower extremity and painful, discolored, thick toenails which interfere with daily activities.  He is followed closely by Vascular team for his PAD.  Patient states blood glucose was 109 mg/dl today.  Last known HgA1c was 6.6%.  New problem(s): None.   Patient is requesting diabetic shoes on today.  PCP is Wenda Low, MD , and last visit was Feb 12, 2022.  Allergies  Allergen Reactions   Other     Other reaction(s): Unknown    Review of Systems: Negative except as noted in the HPI.  Objective: No changes noted in today's physical examination. There were no vitals filed for this visit.  Tyler Whitehead is a pleasant 81 y.o. male in NAD. AAO X 3.  Vascular Examination: Capillary fill time to digits delayed left lower extremity. Nonpalpable pedal pulses LLE. Pedal hair absent. No pain with calf compression LLE. Trace edema noted left lower extremity. No cyanosis or clubbing noted LLE  Dermatological Examination: No open wounds left lower extremity. No interdigital macerations left lower extremity. Pedal skin thin, shiny and atrophic LLE. Toenails 1-5 left foot mycotic. No erythema, no edema, no drainage, no fluctuance. No hyperkeratotic nor porokeratotic lesions present on today's visit.  Musculoskeletal Examination: Muscle strength 5/5 to all LE muscle groups of left lower extremity. Lower extremity amputation(s): above knee amputation right lower extremity. Utilizes walker for ambulation assistance. Wearing diabetic shoes on today's visit.  Neurological Examination: Protective sensation diminished with 10 gram monofilament left lower extremity. Vibratory sensation diminished left lower extremity.  Assessment/Plan: 1. Pain due to onychomycosis of toenails of both feet   2.  Status post above-knee amputation of right lower extremity ([place])   3. Diabetic peripheral neuropathy associated with type 2 diabetes mellitus ([place])    -Patient was evaluated and treated. All patient's and/or POA's questions/concerns answered on today's visit. -Patient with h/o diabetes, neuropathy and PAD s/p AKA RLE followed closely by Vascular. No tissue loss noted on today's visit.. -Continue diabetic shoes daily. -Patient to schedule appointment with Pedorthist for diabetic shoe measurements. -Mycotic toenails 1-5 left foot were gently debrided in length and girth with dremel without incident. -Patient/POA to call should there be question/concern in the interim.   Return in about 3 months (around 05/23/2022).  Marzetta Board, DPM

## 2022-02-25 NOTE — Progress Notes (Signed)
Order for diabetic shoes.

## 2022-02-26 ENCOUNTER — Ambulatory Visit (INDEPENDENT_AMBULATORY_CARE_PROVIDER_SITE_OTHER): Payer: No Typology Code available for payment source | Admitting: Surgery

## 2022-02-26 ENCOUNTER — Ambulatory Visit (HOSPITAL_COMMUNITY)
Admission: RE | Admit: 2022-02-26 | Discharge: 2022-02-26 | Disposition: A | Discharge status: Home / Self Care | Payer: No Typology Code available for payment source | Source: Ambulatory Visit | Attending: Surgery | Admitting: Surgery

## 2022-02-26 ENCOUNTER — Encounter: Payer: Self-pay | Admitting: Surgery

## 2022-02-26 VITALS — BP 143/70 | HR 80 | Temp 98.0°F | Resp 20 | Ht 67.0 in | Wt 165.0 lb

## 2022-02-26 DIAGNOSIS — I70219 Atherosclerosis of native arteries of extremities with intermittent claudication, unspecified extremity: Secondary | ICD-10-CM

## 2022-02-26 DIAGNOSIS — I70248 Atherosclerosis of native arteries of left leg with ulceration of other part of lower left leg: Secondary | ICD-10-CM | POA: Diagnosis not present

## 2022-02-26 NOTE — Progress Notes (Signed)
[facility] [place]  Patient name: Tyler Whitehead MRN: [id number] DOB: 03-09-1941 Sex: male   REASON FOR VISIT:    Follow up  HISOTRY OF PRESENT ILLNESS:    Tyler Whitehead is a 81 y.o. male with a history of a right above-knee femoral-popliteal bypass graft with Gore-Tex, performed in [place].  This lasted approximately 2 to 3 years.  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  He has also undergone left leg bypass graft in [place].  This was occluded when we looked at it by ultrasound several months ago.  It is unknown when it occluded.  There was also the possibility of a femoral-femoral graft however this was not visualized with ultrasound.  His saphenous veins were harvested during CABG.  When I saw him in March 2022, he could tolerate his claudication symptoms and so no intervention was performed.  He then returned in June 2022 with a wound and so he was taken for angiography.  Fortunately his wound healed without the need for revascularization.  He is back today without any wounds.  He does not have rest pain.  The patient suffers from coronary artery disease, status post CABG.  He is a diabetic.  He is medically managed for hypertension.  He takes a statin for hypercholesterolemia.  He is a non-smoker.   PAST MEDICAL HISTORY:   Past Medical History:  Diagnosis Date   CAD (coronary artery disease)    a. S/p 3V CABG 2000 with LIMA-LAD, left radial-PDA, SVG-OM1. b. 2014: stent to SVG-OM1; then found to have obstruction distal to LAD anastamosis of LIMA s/p DES.    CHF (congestive heart failure) ([place])    Diverticular disease    DM (diabetes mellitus) ([place])    Hemorrhoid    HTN (hypertension)    Hyperlipidemia    Peripheral vascular disease ([place])    a. R femoropopliteal bypass ~2012 in [place], reportedly shown to be occluded. Now followed by Dr. [name].   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CC)    RBBB       FAMILY HISTORY:   Family History  Problem Relation Age of Onset   Asthma Father    Heart disease Father    Hyperlipidemia Maternal Grandmother    CAD Other        Multiple siblings   Cancer Other        niece had lung cancer   Cancer Paternal Uncle        brain cancer     SOCIAL HISTORY:   Social History   Tobacco Use   Smoking status: Never   Smokeless tobacco: Never  Substance Use Topics   Alcohol use: Yes    Alcohol/week: 0.0 standard drinks    Comment: sometimes      ALLERGIES:   Allergies  Allergen Reactions   Other     Other reaction(s): Unknown     CURRENT MEDICATIONS:   Current Outpatient Medications  Medication Sig Dispense Refill   aspirin 81 MG EC tablet 1 tablet Orally Once a day     atorvastatin (LIPITOR) 40 MG tablet Take 40 mg by mouth at bedtime.     carvedilol (COREG) 6.25 MG tablet Take 1 tablet (6.25 mg total) by mouth 2 (two) times daily with a meal. 180 tablet 3   cephALEXin (KEFLEX) 500 MG capsule Take 500 mg by mouth 4 (four) times daily.     cilostazol (PLETAL) 100 MG tablet Take 100 mg by mouth  2 (two) times daily.     fluticasone (FLONASE) 50 MCG/ACT nasal spray 1 spray in each nostril     hydroxyurea (HYDREA) 500 MG capsule Take 2 capsules in morning and 1 capsule in evening (Patient taking differently: Take 2 capsules in morning.) 90 capsule 3   insulin glargine (LANTUS) 100 UNIT/ML injection Inject 10 Units into the skin at bedtime.     insulin lispro (HUMALOG) 100 UNIT/ML injection Inject 4 Units into the skin daily as needed for high blood sugar.     isosorbide mononitrate (IMDUR) 60 MG 24 hr tablet Take 2 tablets (120 mg total) by mouth daily. 180 tablet 2   ketoconazole (NIZORAL) 2 % cream Apply 1 application topically daily. 60 g 2   Lancets (ONETOUCH DELICA PLUS [id number]) MISC Apply topically 2 (two) times daily.     levocetirizine (XYZAL) 5 MG tablet Take 5 mg by mouth every evening.     losartan (COZAAR) 25 MG tablet  Take 25 mg by mouth at bedtime.     mirtazapine (REMERON) 30 MG tablet 1 tablet at bedtime Orally Once a day     nitroGLYCERIN (NITROSTAT) 0.4 MG SL tablet Place 1 tablet (0.4 mg total) under the tongue every 5 (five) minutes as needed for chest pain. 25 tablet 3   ONETOUCH VERIO test strip 1 each 2 (two) times daily.     PROAIR HFA 108 (90 BASE) MCG/ACT inhaler Inhale 1 puff into the lungs every 4 (four) hours as needed for wheezing or shortness of breath.      sitaGLIPtin (JANUVIA) 100 MG tablet 1 tablet Orally Once a day for 90 day(s)     No current facility-administered medications for this visit.    REVIEW OF SYSTEMS:   '[X]'$  denotes positive finding, '[ ]'$  denotes negative finding Cardiac  Comments:  Chest pain or chest pressure:    Shortness of breath upon exertion:    Short of breath when lying flat:    Irregular heart rhythm:        Vascular    Pain in calf, thigh, or hip brought on by ambulation:    Pain in feet at night that wakes you up from your sleep:     Blood clot in your veins:    Leg swelling:         Pulmonary    Oxygen at home:    Productive cough:     Wheezing:         Neurologic    Sudden weakness in arms or legs:     Sudden numbness in arms or legs:     Sudden onset of difficulty speaking or slurred speech:    Temporary loss of vision in one eye:     Problems with dizziness:         Gastrointestinal    Blood in stool:     Vomited blood:         Genitourinary    Burning when urinating:     Blood in urine:        Psychiatric    Major depression:         Hematologic    Bleeding problems:    Problems with blood clotting too easily:        Skin    Rashes or ulcers:        Constitutional    Fever or chills:      PHYSICAL EXAM:   Vitals:   02/26/22 1450  BP: (!) 143/70  Pulse: 80  Resp: 20  Temp: 98 F (36.7 C)  SpO2: 95%  Weight: 165 lb (74.8 kg)  Height: '5\' 7"'$  (1.702 m)    GENERAL: The patient is a well-nourished male, in no acute  distress. The vital signs are documented above. CARDIAC: There is a regular rate and rhythm.  VASCULAR: Nonpalpable left pedal pulse PULMONARY: Non-labored respirations MUSCULOSKELETAL: There are no major deformities or cyanosis. NEUROLOGIC: No focal weakness or paresthesias are detected. SKIN: There are no ulcers or rashes noted. PSYCHIATRIC: The patient has a normal affect.  STUDIES:   I have reviewed the following:  +-------+-----------+-----------+------------+------------+  ABI/TBIToday's ABIToday's TBIPrevious ABIPrevious TBI  +-------+-----------+-----------+------------+------------+  Right  AKA                   AKA                       +-------+-----------+-----------+------------+------------+  Left   0.37       0.00       0.46        0.00          +-------+-----------+-----------+------------+------------+   MEDICAL ISSUES:   PAD: The patient has a right leg amputation performed in [place].  He is also undergone multiple revascularization procedures in [place].  I last saw him when he had a wound on his left foot.  He underwent angiography.  He would have required surgical intervention which would have been a below-knee bypass versus tibial bypass.  Fortunately his wound healed.  He is back today without complaints.  I stressed the importance of taking care of his foot and examining on a daily basis, and contacting me should he develop a wound or sore.  He is understanding of this.  He will follow-up in 1 year    Leia Alf, MD, FACS [facility] [phone] Pager [phone]

## 2022-03-02 DIAGNOSIS — H52223 Regular astigmatism, bilateral: Secondary | ICD-10-CM | POA: Diagnosis not present

## 2022-03-08 DIAGNOSIS — R69 Illness, unspecified: Secondary | ICD-10-CM | POA: Diagnosis not present

## 2022-03-13 ENCOUNTER — Ambulatory Visit
Admission: RE | Admit: 2022-03-13 | Discharge: 2022-03-13 | Disposition: A | Discharge status: Home / Self Care | Payer: No Typology Code available for payment source | Source: Ambulatory Visit | Attending: Internal Medicine | Admitting: Internal Medicine

## 2022-03-13 DIAGNOSIS — R413 Other amnesia: Secondary | ICD-10-CM | POA: Diagnosis not present

## 2022-03-13 DIAGNOSIS — S0232XA Fracture of orbital floor, left side, initial encounter for closed fracture: Secondary | ICD-10-CM | POA: Diagnosis not present

## 2022-03-13 DIAGNOSIS — H5461 Unqualified visual loss, right eye, normal vision left eye: Secondary | ICD-10-CM | POA: Diagnosis not present

## 2022-03-13 DIAGNOSIS — R69 Illness, unspecified: Secondary | ICD-10-CM | POA: Diagnosis not present

## 2022-03-13 DIAGNOSIS — R2689 Other abnormalities of gait and mobility: Secondary | ICD-10-CM | POA: Diagnosis not present

## 2022-03-14 ENCOUNTER — Ambulatory Visit (INDEPENDENT_AMBULATORY_CARE_PROVIDER_SITE_OTHER): Payer: No Typology Code available for payment source

## 2022-03-14 DIAGNOSIS — Z89611 Acquired absence of right leg above knee: Secondary | ICD-10-CM

## 2022-03-14 DIAGNOSIS — M86672 Other chronic osteomyelitis, left ankle and foot: Secondary | ICD-10-CM | POA: Diagnosis not present

## 2022-03-14 DIAGNOSIS — E1159 Type 2 diabetes mellitus with other circulatory complications: Secondary | ICD-10-CM

## 2022-03-14 DIAGNOSIS — E1142 Type 2 diabetes mellitus with diabetic polyneuropathy: Secondary | ICD-10-CM

## 2022-03-14 NOTE — Progress Notes (Signed)
SITUATION Reason for Visit: Fitting of Diabetic Shoes & Insoles Patient / Caregiver Report:  Patient is satisfied with fit and function of shoes and insoles.  OBJECTIVE DATA: Patient History / Diagnosis:     ICD-10-CM   1. Diabetic peripheral neuropathy associated with type 2 diabetes mellitus (HCC)  E11.42     2. Status post above-knee amputation of right lower extremity ([place])  Z89.611       Change in Status:   None  ACTIONS PERFORMED: In-Person Delivery, patient was fit with: - 1x pair A5500 PDAC approved prefabricated Diabetic Shoes: Apex black velcro sneaker 11.5W - 3x left [id number] PDAC approved vacuum formed custom diabetic insoles; [employer]: [id number]  Shoes and insoles were verified for structural integrity and safety. Patient wore shoes and insoles in office. Skin was inspected and free of areas of concern after wearing shoes and inserts. Shoes and inserts fit properly. Patient / Caregiver provided with ferbal instruction and demonstration regarding donning, doffing, wear, care, proper fit, function, purpose, cleaning, and use of shoes and insoles ' and in all related precautions and risks and benefits regarding shoes and insoles. Patient / Caregiver was instructed to wear properly fitting socks with shoes at all times. Patient was also provided with verbal instruction regarding how to report any failures or malfunctions of shoes or inserts, and necessary follow up care. Patient / Caregiver was also instructed to contact physician regarding change in status that may affect function of shoes and inserts.   Patient / Caregiver verbalized undersatnding of instruction provided. Patient / Caregiver demonstrated independence with proper donning and doffing of shoes and inserts.  PLAN Patient to follow with treating physician as recommended. Plan of care was discussed with and agreed upon by patient and/or caregiver. All questions were answered and concerns addressed.

## 2022-03-28 NOTE — Progress Notes (Unsigned)
Cardiology Clinic Note   Patient Name: Tyler Whitehead Date of Encounter: 03/28/2022  Primary Care Provider:  Wenda Low, MD Primary Cardiologist:  None  Patient Profile    Tyler Whitehead 81 year old male presents to the clinic today for follow-up evaluation of his coronary artery disease and hypertension.  Past Medical History    Past Medical History:  Diagnosis Date   CAD (coronary artery disease)    a. S/p 3V CABG 2000 with LIMA-LAD, left radial-PDA, SVG-OM1. b. 2014: stent to SVG-OM1; then found to have obstruction distal to LAD anastamosis of LIMA s/p DES.    CHF (congestive heart failure) ([place])    Diverticular disease    DM (diabetes mellitus) ([place])    Hemorrhoid    HTN (hypertension)    Hyperlipidemia    Peripheral vascular disease ([place])    a. R femoropopliteal bypass ~2012 in [place], reportedly shown to be occluded. Now followed by Dr. [name].   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[place])    RBBB    Past Surgical History:  Procedure Laterality Date   ABDOMINAL ANGIOGRAM N/A 01/18/2015   Procedure: ABDOMINAL ANGIOGRAM;  Surgeon: Serafina Mitchell, MD;  Location: [facility] CATH LAB;  Service: Cardiovascular;  Laterality: N/A;   ABDOMINAL AORTAGRAM N/A 09/21/2014   Procedure: ABDOMINAL Maxcine Ham;  Surgeon: Elam Dutch, MD;  Location: [facility] CATH LAB;  Service: Cardiovascular;  Laterality: N/A;   ABDOMINAL AORTOGRAM W/LOWER EXTREMITY N/A 03/28/2021   Procedure: ABDOMINAL AORTOGRAM W/LOWER EXTREMITY;  Surgeon: Serafina Mitchell, MD;  Location: [facility] CV LAB;  Service: Cardiovascular;  Laterality: N/A;   CARDIAC CATHETERIZATION  May 2014   CORONARY ARTERY BYPASS GRAFT  2000   LM occluded, LIMA to LAD patent but distal native LAD disease, saphenous vein bypass graft to OM with 95% stenosis in the midportion, right coronary artery occluded, radial artery graft to right coronary artery patent but was diffusely small. Marginal graft stented. 7 2014. Of note he had multiple previous stents in the  right coronary artery. The distal LAD was treated with angioplasty. I   LOOP RECORDER IMPLANT N/A 07/12/2014   Procedure: LOOP RECORDER IMPLANT;  Surgeon: Evans Lance, MD;  Location: [facility] CATH LAB;  Service: Cardiovascular;  Laterality: N/A;   LOWER EXTREMITY ANGIOGRAM  09/21/2014   Procedure: LOWER EXTREMITY ANGIOGRAM;  Surgeon: Elam Dutch, MD;  Location: [facility] CATH LAB;  Service: Cardiovascular;;   PERCUTANEOUS CORONARY STENT INTERVENTION (PCI-S)  April 27 2013   right leg amputation  2017   TEE WITHOUT CARDIOVERSION N/A 07/12/2014   Procedure: TRANSESOPHAGEAL ECHOCARDIOGRAM (TEE);  Surgeon: Fay Records, MD;  Location[facility] ENDOSCOPY;  Service: Cardiovascular;  Laterality: N/A;   VEIN SURGERY      Allergies  Allergies  Allergen Reactions   Other     Other reaction(s): Unknown    History of Present Illness    Tyler Whitehead has a PMH of coronary artery disease status post CABG x3, PVD, hypertension, CVA, diverticular disease, type 2 diabetes, dyslipidemia, seizure, vitamin D deficiency, memory deficit, right above-the-knee amputation, and gait abnormality.  Cardiac catheterization 05/11/2020 in [place] showed 100% left main and mid RCA in-stent lesions.  He was noted to have LIMA-LAD patent collaterals to RCA.  Patent SVG-OM1, occluded vein graft to RCA.  He was noted to have a 100% left SFA occlusion.  Medical management was recommended.  He underwent retrograde cannulation of his left tibial peroneal trunk with angioplasty.  His carotid Dopplers showed 40-50% stenosis in the left.  His echocardiogram showed an EF of 60%.  He was seen by Dr. Percival Spanish in 2017.  He had moved back to [place] and was relocating to the area.  His catheterization in [place] showed that medical therapy may enhance ongoing exertional chest discomfort.  He was noted to have ongoing exertional chest discomfort despite maximal medical management.  It was felt that he may benefit from PCI of his RCA.  It was felt that  due to his PMH is RCA may not remain open if PCI was not attempted.  His PVD was managed by Dr. Trula Slade.  He had a loop recorder implanted (Dr. Lovena Le) after he had CVA with unclear etiology.  He followed up with Dr. Percival Spanish 12/28/2021.  He continued to have chest discomfort however, it appeared to be in a stable exertional pattern.  He reported taking 3 bottles of nitroglycerin the previous year.  At the time of the evaluation he had not had any nitroglycerin in the last 2 or 3 weeks.  He noted his chest discomfort when he would move quickly with his walker and his prosthesis.  He denied chest discomfort at rest.  He denied orthopnea and PND.  He denied palpitations presyncope and syncope.  He felt that his symptoms were unchanged since his catheterization in 2021.  There was some confusion noted surrounding his medications.  Follow-up with pharmacy to review his medications was planned.  His Imdur was increased to 120 mg daily.  He presents to the clinic today for follow-up evaluation and states***  *** denies chest pain, shortness of breath, lower extremity edema, fatigue, palpitations, melena, hematuria, hemoptysis, diaphoresis, weakness, presyncope, syncope, orthopnea, and PND.   Home Medications    Prior to Admission medications   Medication Sig Start Date End Date Taking? Authorizing Provider  aspirin 81 MG EC tablet 1 tablet Orally Once a day    [provider]  atorvastatin (LIPITOR) 40 MG tablet Take 40 mg by mouth at bedtime.    [provider]  carvedilol (COREG) 6.25 MG tablet Take 1 tablet (6.25 mg total) by mouth 2 (two) times daily with a meal. 12/28/21   Minus Breeding, MD  cephALEXin (KEFLEX) 500 MG capsule Take 500 mg by mouth 4 (four) times daily.    [provider]  cilostazol (PLETAL) 100 MG tablet Take 100 mg by mouth 2 (two) times daily.    [provider]  fluticasone (FLONASE) 50 MCG/ACT nasal spray 1 spray in each nostril 11/08/21    [provider]  hydroxyurea (HYDREA) 500 MG capsule Take 2 capsules in morning and 1 capsule in evening Patient taking differently: Take 2 capsules in morning. 06/26/21   Truitt Merle, MD  insulin glargine (LANTUS) 100 UNIT/ML injection Inject 10 Units into the skin at bedtime.    [provider]  insulin lispro (HUMALOG) 100 UNIT/ML injection Inject 4 Units into the skin daily as needed for high blood sugar.    [provider]  isosorbide mononitrate (IMDUR) 60 MG 24 hr tablet Take 2 tablets (120 mg total) by mouth daily. 01/16/22   Minus Breeding, MD  ketoconazole (NIZORAL) 2 % cream Apply 1 application topically daily. 11/21/21   McDonald, Stephan Minister, DPM  Lancets (ONETOUCH DELICA PLUS [id number]) MISC Apply topically 2 (two) times daily. 12/28/20   [provider]  levocetirizine (XYZAL) 5 MG tablet Take 5 mg by mouth every evening.    [provider]  losartan (COZAAR) 25 MG tablet Take 25 mg by mouth  at bedtime.    [provider]  mirtazapine (REMERON) 30 MG tablet 1 tablet at bedtime Orally Once a day    [provider]  nitroGLYCERIN (NITROSTAT) 0.4 MG SL tablet Place 1 tablet (0.4 mg total) under the tongue every 5 (five) minutes as needed for chest pain. 12/28/21   Minus Breeding, MD  [facility] VERIO test strip 1 each 2 (two) times daily. 01/25/21   [provider]  PROAIR HFA 108 (90 BASE) MCG/ACT inhaler Inhale 1 puff into the lungs every 4 (four) hours as needed for wheezing or shortness of breath.  12/03/13   [provider]  sitaGLIPtin (JANUVIA) 100 MG tablet 1 tablet Orally Once a day for 90 day(s) 06/26/18   [provider]  isosorbide mononitrate (IMDUR) 60 MG 24 hr tablet Take 1-2 tablets (60-120 mg total) by mouth 2 (two) times daily. 2 in the morning and 1 in the evening 09/22/20   Minus Breeding, MD    Family History    Family History  Problem Relation Age of Onset   Asthma Father    Heart  disease Father    Hyperlipidemia Maternal Grandmother    CAD Other        Multiple siblings   Cancer Other        niece had lung cancer   Cancer Paternal Uncle        brain cancer    He indicated that his mother is deceased. He indicated that his father is deceased. He indicated that the status of his maternal grandmother is unknown. He indicated that the status of his paternal uncle is unknown. He indicated that the status of his other is unknown.  Social History    Social History   Socioeconomic History   Marital status: Divorced    Spouse name: Not on file   Number of children: Not on file   Years of education: Not on file   Highest education level: Not on file  Occupational History   Occupation: Limo driver  Tobacco Use   Smoking status: Never   Smokeless tobacco: Never  Substance and Sexual Activity   Alcohol use: Yes    Alcohol/week: 0.0 standard drinks of alcohol    Comment: sometimes    Drug use: No   Sexual activity: Not on file  Other Topics Concern   Not on file  Social History Narrative   Lives with wife.    Social Determinants of Health   Financial Resource Strain: Not on file  Food Insecurity: Not on file  Transportation Needs: Unmet Transportation Needs (12/28/2021)   PRAPARE - Hydrologist (Medical): Yes    Lack of Transportation (Non-Medical): Yes  Physical Activity: Not on file  Stress: Not on file  Social Connections: Not on file  Intimate Partner Violence: Not on file     Review of Systems    General:  No chills, fever, night sweats or weight changes.  Cardiovascular:  No chest pain, dyspnea on exertion, edema, orthopnea, palpitations, paroxysmal nocturnal dyspnea. Dermatological: No rash, lesions/masses Respiratory: No cough, dyspnea Urologic: No hematuria, dysuria Abdominal:   No nausea, vomiting, diarrhea, bright red blood per rectum, melena, or hematemesis Neurologic:  No visual changes, wkns, changes in  mental status. All other systems reviewed and are otherwise negative except as noted above.  Physical Exam    VS:  There were no vitals taken for this visit. , BMI There is no height or weight on file  to calculate BMI. GEN: Well nourished, well developed, in no acute distress. HEENT: normal. Neck: Supple, no JVD, carotid bruits, or masses. Cardiac: RRR, no murmurs, rubs, or gallops. No clubbing, cyanosis, edema.  Radials/DP/PT 2+ and equal bilaterally.  Respiratory:  Respirations regular and unlabored, clear to auscultation bilaterally. GI: Soft, nontender, nondistended, BS + x 4. MS: no deformity or atrophy. Skin: warm and dry, no rash. Neuro:  Strength and sensation are intact. Psych: Normal affect.  Accessory Clinical Findings    Recent Labs: 02/01/2022: ALT 9; BUN 18; Creatinine 1.16; Hemoglobin 11.6; Platelet Count 467; Potassium 4.5; Sodium 141   Recent Lipid Panel    Component Value Date/Time   CHOL 94 07/11/2014 0430   TRIG 101 07/11/2014 0430   HDL 34 (L) 07/11/2014 0430   CHOLHDL 2.8 07/11/2014 0430   VLDL 20 07/11/2014 0430   LDLCALC 40 07/11/2014 0430    ECG personally reviewed by me today- *** - No acute changes  Echocardiogram 07/12/2014 Indications:      CVA 436.                   Diagnostic  transesophageal echocardiography.  2D and color Doppler.  Birthdate:  Patient birthdate: 01-Oct-1941.  Age:  Patient is 81 yr  old.  Sex:  Gender: male.    BMI: 30 kg/m^2.  Blood pressure:  139/80  Patient status:  Inpatient.  Study date:  Study date:  07/12/2014. Study time: 11:26 AM.  Location:  Endoscopy.   -------------------------------------------------------------------   -------------------------------------------------------------------  Left ventricle:  LVEF is normal.   -------------------------------------------------------------------  Aortic valve:  AV is minimally thickend. No AI.   -------------------------------------------------------------------   Aorta:  MIld fixed plaque in thoracic aorta.   -------------------------------------------------------------------  Mitral valve:  MV is normal MIld MR.   -------------------------------------------------------------------  Left atrium:   No evidence of thrombus in the atrial cavity or  appendage.  No evidence of thrombus in the atrial cavity or  appendage.   -------------------------------------------------------------------  Atrial septum:  No PFO as teste with injection of agitated saline  or with color doppler.   -------------------------------------------------------------------  Pulmonic valve:   PV is normal.   -------------------------------------------------------------------  Tricuspid valve:  TV is normal No TR>     -------------------------------------------------------------------  Post procedure conclusions  Ascending Aorta:   - MIld fixed plaque in thoracic aorta.   -------------------------------------------------------------------  Prepared and Electronically Authenticated by   [name], M.D.     Assessment & Plan   1.  Coronary artery disease-continues to have occasional episodes of chest discomfort with increased physical activity.  Chronic stable.  Status post CABG x3 2/15.catheterization 05/11/2020 in [place] showed 100% left main and mid RCA in-stent lesions.  He was noted to have LIMA-LAD patent collaterals to RCA.  Patent SVG-OM1, occluded vein graft to RCA. Continue Imdur, carvedilol, atorvastatin, aspirin, nitroglycerin Heart healthy low-sodium diet-salty 6 given Increase physical activity as tolerated  Hyperlipidemia-LDL*** Continue aspirin, atorvastatin Heart healthy low-sodium high-fiber diet Increase physical activity as tolerated  Essential hypertension-BP today***.  Well-controlled at home. Continue carvedilol, losartan Heart healthy low-sodium diet-salty 6 given Increase physical activity as tolerated  Peripheral vascular  disease-status post right above-knee amputation (09/08/2021). Follows with [facility]  Diabetes mellitus-glucose 183 on 02/01/2022 Heart healthy low-sodium carb modified diet Continue insulin, Januvia  Disposition: Follow-up with Dr. Percival Spanish in 4-6 months.  Jossie Ng. Antavius Sperbeck NP-C    03/28/2022, 7:00 AM [address] Office [phone] Fax [phone]  Notice:  This dictation was prepared with Dragon dictation along with smaller phrase technology. Any transcriptional errors that result from this process are unintentional and may not be corrected upon review.  I spent***minutes examining this patient, reviewing medications, and using patient centered shared decision making involving her cardiac care.  Prior to her visit I spent greater than 20 minutes reviewing her past medical history,  medications, and prior cardiac tests.

## 2022-03-30 ENCOUNTER — Ambulatory Visit: Payer: No Typology Code available for payment source | Admitting: General Practice

## 2022-03-30 ENCOUNTER — Encounter: Payer: Self-pay | Admitting: General Practice

## 2022-03-30 ENCOUNTER — Ambulatory Visit (INDEPENDENT_AMBULATORY_CARE_PROVIDER_SITE_OTHER): Payer: No Typology Code available for payment source | Admitting: General Practice

## 2022-03-30 VITALS — BP 122/50 | HR 70 | Ht 67.0 in | Wt 161.0 lb

## 2022-03-30 DIAGNOSIS — I25118 Atherosclerotic heart disease of native coronary artery with other forms of angina pectoris: Secondary | ICD-10-CM

## 2022-03-30 DIAGNOSIS — E118 Type 2 diabetes mellitus with unspecified complications: Secondary | ICD-10-CM

## 2022-03-30 DIAGNOSIS — E785 Hyperlipidemia, unspecified: Secondary | ICD-10-CM | POA: Diagnosis not present

## 2022-03-30 DIAGNOSIS — I10 Essential (primary) hypertension: Secondary | ICD-10-CM

## 2022-04-06 DIAGNOSIS — R69 Illness, unspecified: Secondary | ICD-10-CM | POA: Diagnosis not present

## 2022-04-06 DIAGNOSIS — Z1211 Encounter for screening for malignant neoplasm of colon: Secondary | ICD-10-CM | POA: Diagnosis not present

## 2022-04-11 DIAGNOSIS — I25709 Atherosclerosis of coronary artery bypass graft(s), unspecified, with unspecified angina pectoris: Secondary | ICD-10-CM | POA: Diagnosis not present

## 2022-04-11 DIAGNOSIS — D471 Chronic myeloproliferative disease: Secondary | ICD-10-CM | POA: Diagnosis not present

## 2022-04-11 DIAGNOSIS — Z89619 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: Secondary | ICD-10-CM | POA: Diagnosis not present

## 2022-04-11 DIAGNOSIS — Z8673 Personal history of transient ischemic attack (TIA), and cerebral infarction without residual deficits: Secondary | ICD-10-CM | POA: Diagnosis not present

## 2022-04-11 DIAGNOSIS — I739 Peripheral vascular disease, unspecified: Secondary | ICD-10-CM | POA: Diagnosis not present

## 2022-04-11 DIAGNOSIS — E1140 Type 2 diabetes mellitus with diabetic neuropathy, unspecified: Secondary | ICD-10-CM | POA: Diagnosis not present

## 2022-04-11 DIAGNOSIS — Z951 Presence of aortocoronary bypass graft: Secondary | ICD-10-CM | POA: Diagnosis not present

## 2022-04-11 DIAGNOSIS — G8190 Hemiplegia, unspecified affecting unspecified side: Secondary | ICD-10-CM | POA: Diagnosis not present

## 2022-04-11 DIAGNOSIS — R269 Unspecified abnormalities of gait and mobility: Secondary | ICD-10-CM | POA: Diagnosis not present

## 2022-04-11 DIAGNOSIS — H539 Unspecified visual disturbance: Secondary | ICD-10-CM | POA: Diagnosis not present

## 2022-04-11 DIAGNOSIS — R69 Illness, unspecified: Secondary | ICD-10-CM | POA: Diagnosis not present

## 2022-05-02 ENCOUNTER — Inpatient Hospital Stay (HOSPITAL_BASED_OUTPATIENT_CLINIC_OR_DEPARTMENT_OTHER): Payer: No Typology Code available for payment source | Admitting: Hematology

## 2022-05-02 ENCOUNTER — Inpatient Hospital Stay: Payer: No Typology Code available for payment source | Attending: Hematology

## 2022-05-02 ENCOUNTER — Other Ambulatory Visit: Payer: Self-pay

## 2022-05-02 VITALS — BP 134/62 | HR 87 | Temp 97.8°F | Wt 167.4 lb

## 2022-05-02 DIAGNOSIS — Z7964 Long term (current) use of myelosuppressive agent: Secondary | ICD-10-CM | POA: Diagnosis not present

## 2022-05-02 DIAGNOSIS — I739 Peripheral vascular disease, unspecified: Secondary | ICD-10-CM

## 2022-05-02 DIAGNOSIS — D7581 Myelofibrosis: Secondary | ICD-10-CM

## 2022-05-02 DIAGNOSIS — I10 Essential (primary) hypertension: Secondary | ICD-10-CM | POA: Diagnosis not present

## 2022-05-02 DIAGNOSIS — Z79899 Other long term (current) drug therapy: Secondary | ICD-10-CM | POA: Diagnosis not present

## 2022-05-02 DIAGNOSIS — E785 Hyperlipidemia, unspecified: Secondary | ICD-10-CM

## 2022-05-02 DIAGNOSIS — I2510 Atherosclerotic heart disease of native coronary artery without angina pectoris: Secondary | ICD-10-CM

## 2022-05-02 DIAGNOSIS — E119 Type 2 diabetes mellitus without complications: Secondary | ICD-10-CM

## 2022-05-02 DIAGNOSIS — D471 Chronic myeloproliferative disease: Secondary | ICD-10-CM

## 2022-05-02 DIAGNOSIS — Z8673 Personal history of transient ischemic attack (TIA), and cerebral infarction without residual deficits: Secondary | ICD-10-CM | POA: Diagnosis not present

## 2022-05-02 LAB — CBC WITH DIFFERENTIAL (CANCER CENTER ONLY)
Abs Immature Granulocytes: 0.12 10*3/uL — ABNORMAL HIGH (ref 0.00–0.07)
Basophils Absolute: 0.2 10*3/uL — ABNORMAL HIGH (ref 0.0–0.1)
Basophils Relative: 1 %
Eosinophils Absolute: 0.4 10*3/uL (ref 0.0–0.5)
Eosinophils Relative: 3 %
HCT: 35.4 % — ABNORMAL LOW (ref 39.0–52.0)
Hemoglobin: 11.7 g/dL — ABNORMAL LOW (ref 13.0–17.0)
Immature Granulocytes: 1 %
Lymphocytes Relative: 5 %
Lymphs Abs: 0.8 10*3/uL (ref 0.7–4.0)
MCH: 34.2 pg — ABNORMAL HIGH (ref 26.0–34.0)
MCHC: 33.1 g/dL (ref 30.0–36.0)
MCV: 103.5 fL — ABNORMAL HIGH (ref 80.0–100.0)
Monocytes Absolute: 0.6 10*3/uL (ref 0.1–1.0)
Monocytes Relative: 3 %
Neutro Abs: 14.4 10*3/uL — ABNORMAL HIGH (ref 1.7–7.7)
Neutrophils Relative %: 87 %
Platelet Count: 459 10*3/uL — ABNORMAL HIGH (ref 150–400)
RBC: 3.42 MIL/uL — ABNORMAL LOW (ref 4.22–5.81)
RDW: 19.2 % — ABNORMAL HIGH (ref 11.5–15.5)
WBC Count: 16.4 10*3/uL — ABNORMAL HIGH (ref 4.0–10.5)
nRBC: 0.1 % (ref 0.0–0.2)

## 2022-05-02 LAB — CMP (CANCER CENTER ONLY)
ALT: 15 U/L (ref 0–44)
AST: 13 U/L — ABNORMAL LOW (ref 15–41)
Albumin: 3.6 g/dL (ref 3.5–5.0)
Alkaline Phosphatase: 80 U/L (ref 38–126)
Anion gap: 3 — ABNORMAL LOW (ref 5–15)
BUN: 22 mg/dL (ref 8–23)
CO2: 28 mmol/L (ref 22–32)
Calcium: 8.5 mg/dL — ABNORMAL LOW (ref 8.9–10.3)
Chloride: 107 mmol/L (ref 98–111)
Creatinine: 1.32 mg/dL — ABNORMAL HIGH (ref 0.61–1.24)
GFR, Estimated: 55 mL/min — ABNORMAL LOW (ref >60)
Glucose, Bld: 296 mg/dL — ABNORMAL HIGH (ref 70–99)
Potassium: 4.4 mmol/L (ref 3.5–5.1)
Sodium: 138 mmol/L (ref 135–145)
Total Bilirubin: 0.4 mg/dL (ref 0.3–1.2)
Total Protein: 5.9 g/dL — ABNORMAL LOW (ref 6.5–8.1)

## 2022-05-02 MED ORDER — HYDROXYUREA 500 MG PO CAPS: ORAL_CAPSULE | ORAL | 5 refills | Status: DC

## 2022-05-02 NOTE — Progress Notes (Signed)
[name]   [phone] [phone]   Clinic Follow up Note   Patient Care Team: Tyler Low, MD as PCP - General (Internal Medicine) Tyler Pickett Nadean Corwin, MD as Consulting Physician (Cardiology) Tyler Breeding, MD as Consulting Physician (Cardiology) Tyler Sprang, MD as Consulting Physician (Neurology)  Date of Service:  05/02/2022  CHIEF COMPLAINT: f/u of myelofibrosis  CURRENT THERAPY:  Hydrea 1030m daily  ASSESSMENT & PLAN:  Tyler MCGLINNis a 81y.o. male with   1. Primary Myeloproliferative neoplasm (Myelofibrosis), JAK2 mutation (+), DIPSS 2, 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 Previously discussed this is not curable but treatable, risk of developing acute leukemia or aplastic anemia later on. -he is not a good candidate for stem cell transplant due to his advanced age and multiple medical comorbidities. -he tried [place] 4 months in 2014, could not tolerate due to his anemia and fatigue. -He is aware of the importance of continued Hydrea and normalized his blood counts to prevent further stroke or thrombosis.  -he continues hydrea, tolerating well. He is supposed to be taking Hydrea to 1000 mg AM/500 mg PM daily but notes he is not taking the evening dose. -labs reviewed, WBC 16.4, hgb 11.7, plt 459k. Since his counts are stable, he can continue just 10067mAM.   2. Comorbidities: DM, Dyslipidemia, HTN, CAD -he is s/p right AKA, uses a walker, able to live independently -f/u with PCP and cardiologist   3. PVD  -h/o multiple failed bypass grafts -angiogram in 03/2021 showed occlusion of superficial femoral artery and popliteal artery occlusion -on aspirin and surveillance by Dr. BrTrula Whitehead 4. Stroke and seizure in 07/2014 -follow up with his neurologist      Plan: -continue Hydrea, 100033maily, I refilled for him today.  -Labs in 3 months  -labs and f/u in 6 months   No problem-specific  Assessment & Plan notes Whitehead for this encounter.   SUMMARY OF ONCOLOGIC HISTORY: Oncology History  [place] ([place]2014 Bone Marrow Biopsy   Showed hypercellularity with reticulum fibrosis, JAK-2 mutation positive, BCR/ABL negative, 46 47 in 20 metaphase, and Intermediate-1 ,DIPSS of 2. (WBC < 25; Hbg > 10;    01/02/2013 Imaging   US [place] complete revealed Mild splenogmealy (13.3 cm) and a 2.2 x 1.7 x 1.8 cm left renal cyst   01/15/2013 Pathology Results   Integrated hematopathology report.  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    03/17/2013 Imaging   CT Chest: Crowded peribronchial markings noted, associated with linear densities involving the left base, probably representing areas of platelike atelectasis/scarring.  No mass or infiltrate seen.    03/19/2013 - 07/20/2013 Chemotherapy   Started Jakafi 20 mg bid. Dose reduced due to anemia.  Stopped due to increasing fatigue while on jakafi.    04/28/2013 - 05/01/2013 Hospital Admission   [facility] Jam[facility]ia Gre[facility] cardiologist, due to the fact that he had midsternal chest pain.  He had a coronary stent placed this admission.    05/28/2013 - 05/29/2013 Hospital Admission   [facility] (Tyler White[facility]    06/10/2013 Surgery   Cololonoscopy due to GIB.  C/w diverticular disease and hemorrhoids but no active bleeding.    10/05/2013 -  Chemotherapy   Started Hydrea 500 mg daily.  03/19/2014 -  Chemotherapy   Hydrea increased to 500 mg bid wbc 17, hb 17, hct 54, plt 437   06/29/2014 Procedure   cbc shows wbc 18.8 hb 18.7 hct 58.9 plt 456. ?compliance w hydrea as MCV not elevated. symptomatic start TP weekly x 4   07/28/2014 Procedure   TP every 2 weeks if hct > 45, changed to monthly in April 2016       INTERVAL HISTORY:  Tyler Whitehead is here for a follow up of myelofibrosis. He was last seen by me on 11/02/21. He presents to the clinic alone. He reports he is doing well overall but feels bored. He reports his energy level is "50/50." He notes he has a Whitehead appetite but continues to eat well.   All other systems were reviewed with the patient and are negative.  MEDICAL HISTORY:  Past Medical History:  Diagnosis Date   CAD (coronary artery disease)    a. S/p 3V CABG 2000 with LIMA-LAD, left radial-PDA, SVG-OM1. b. 2014: stent to SVG-OM1; then Whitehead to have obstruction distal to LAD anastamosis of LIMA s/p DES.    CHF (congestive heart failure) ([place])    Diverticular disease    DM (diabetes mellitus) ([place])    Hemorrhoid    HTN (hypertension)    Hyperlipidemia    Peripheral vascular disease ([place])    a. R femoropopliteal bypass ~2012 in [place], reportedly shown to be occluded. Now followed by Dr. Gwenlyn Whitehead.   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[place])    RBBB     SURGICAL HISTORY: Past Surgical History:  Procedure Laterality Date   ABDOMINAL ANGIOGRAM N/A 01/18/2015   Procedure: ABDOMINAL ANGIOGRAM;  Surgeon: Tyler Mitchell, MD;  Location: [facility] CATH LAB;  Service: Cardiovascular;  Laterality: N/A;   ABDOMINAL AORTAGRAM N/A 09/21/2014   Procedure: ABDOMINAL Tyler Whitehead;  Surgeon: Tyler Dutch, MD;  Location: [facility] CATH LAB;  Service: Cardiovascular;  Laterality: N/A;   ABDOMINAL AORTOGRAM W/LOWER EXTREMITY N/A 03/28/2021   Procedure: ABDOMINAL AORTOGRAM W/LOWER EXTREMITY;  Surgeon: Tyler Mitchell, MD;  Location: [place] CV LAB;  Service: Cardiovascular;  Laterality: N/A;   CARDIAC CATHETERIZATION  May 2014   CORONARY ARTERY BYPASS GRAFT  2000   LM occluded, LIMA to LAD patent but distal native LAD disease, saphenous vein bypass graft to OM with 95% stenosis in the midportion, right coronary artery occluded, radial artery graft to right coronary artery patent but was diffusely small. Marginal graft  stented. 7 2014. Of note he had multiple previous stents in the right coronary artery. The distal LAD was treated with angioplasty. I   LOOP RECORDER IMPLANT N/A 07/12/2014   Procedure: LOOP RECORDER IMPLANT;  Surgeon: Evans Lance, MD;  Location[facility] CATH LAB;  Service: Cardiovascular;  Laterality: N/A;   LOWER EXTREMITY ANGIOGRAM  09/21/2014   Procedure: LOWER EXTREMITY ANGIOGRAM;  Surgeon: Tyler Dutch, MD;  Location: [facility] CATH LAB;  Service: Cardiovascular;;   PERCUTANEOUS CORONARY STENT INTERVENTION (PCI-S)  April 27 2013   right leg amputation  2017   TEE WITHOUT CARDIOVERSION N/A 07/12/2014   Procedure: TRANSESOPHAGEAL ECHOCARDIOGRAM (TEE);  Surgeon: Fay Records, MD;  Location: [facility] ENDOSCOPY;  Service: Cardiovascular;  Laterality: N/A;   VEIN SURGERY      I have reviewed the social history and family history with the patient and they are unchanged from previous note.  ALLERGIES:  is allergic to other.  MEDICATIONS:  Current Outpatient Medications  Medication Sig Dispense Refill   aspirin 81 MG  EC tablet 1 tablet Orally Once a day     atorvastatin (LIPITOR) 40 MG tablet Take 40 mg by mouth at bedtime.     carvedilol (COREG) 6.25 MG tablet Take 1 tablet (6.25 mg total) by mouth 2 (two) times daily with a meal. 180 tablet 3   cilostazol (PLETAL) 100 MG tablet Take 100 mg by mouth 2 (two) times daily.     fluticasone (FLONASE) 50 MCG/ACT nasal spray 1 spray in each nostril     hydroxyurea (HYDREA) 500 MG capsule Take 2 capsules in morning. 90 capsule 5   insulin glargine (LANTUS) 100 UNIT/ML injection Inject 10 Units into the skin at bedtime.     insulin lispro (HUMALOG) 100 UNIT/ML injection Inject 4 Units into the skin daily as needed for high blood sugar.     isosorbide mononitrate (IMDUR) 60 MG 24 hr tablet Take 2 tablets (120 mg total) by mouth daily. 180 tablet 2   Lancets (ONETOUCH DELICA PLUS [id number]) MISC Apply topically 2 (two) times daily.     levocetirizine (XYZAL) 5 MG  tablet Take 5 mg by mouth every evening.     losartan (COZAAR) 25 MG tablet Take 25 mg by mouth at bedtime.     nitroGLYCERIN (NITROSTAT) 0.4 MG SL tablet Place 1 tablet (0.4 mg total) under the tongue every 5 (five) minutes as needed for chest pain. 25 tablet 3   ONETOUCH VERIO test strip 1 each 2 (two) times daily.     PROAIR HFA 108 (90 BASE) MCG/ACT inhaler Inhale 1 puff into the lungs every 4 (four) hours as needed for wheezing or shortness of breath.      No current facility-administered medications for this visit.    PHYSICAL EXAMINATION: ECOG PERFORMANCE STATUS: 2 - Symptomatic, <50% confined to bed  Vitals:   05/02/22 1204  BP: 134/62  Pulse: 87  Temp: 97.8 F (36.6 C)  SpO2: 100%   Wt Readings from Last 3 Encounters:  05/02/22 167 lb 6.4 oz (75.9 kg)  03/30/22 161 lb (73 kg)  02/26/22 165 lb (74.8 kg)     GENERAL:alert, no distress and comfortable SKIN: skin color normal, no rashes or significant lesions EYES: normal, Conjunctiva are pink and non-injected, sclera clear  NEURO: alert & oriented x 3 with fluent speech  LABORATORY DATA:  I have reviewed the data as listed    Latest Ref Rng & Units 05/02/2022   11:10 AM 02/01/2022    1:09 PM 12/14/2021    1:04 PM  CBC  WBC 4.0 - 10.5 K/uL 16.4  10.1  14.4   Hemoglobin 13.0 - 17.0 g/dL 11.7  11.6  11.0   Hematocrit 39.0 - 52.0 % 35.4  36.0  32.6   Platelets 150 - 400 K/uL 459  467  540         Latest Ref Rng & Units 05/02/2022   11:10 AM 02/01/2022    1:09 PM 12/14/2021    1:05 PM  CMP  Glucose 70 - 99 mg/dL 296  183  281   BUN 8 - 23 mg/dL _0 Creatinine 0.61 - 1.24 mg/dL 1.32  1.16  1.05   Sodium 135 - 145 mmol/L 138  141  136   Potassium 3.5 - 5.1 mmol/L 4.4  4.5  4.7   Chloride 98 - 111 mmol/L 107  106  104   CO2 22 - 32 mmol/L _1 Calcium 8.9 - 10.3  mg/dL 8.5  9.0  8.9   Total Protein 6.5 - 8.1 g/dL 5.9  6.1  6.2   Total Bilirubin 0.3 - 1.2 mg/dL 0.4  0.5  0.5   Alkaline Phos 38 - 126  U/L 80  70  85   AST 15 - 41 U/L _0 ALT 0 - 44 U/L _1 RADIOGRAPHIC STUDIES: I have personally reviewed the radiological images as listed and agreed with the findings in the report. No results Whitehead.    No orders of the defined types were placed in this encounter.  All questions were answered. The patient knows to call the clinic with any problems, questions or concerns. No barriers to learning was detected. The total time spent in the appointment was 20 minutes.     Truitt Merle, MD 05/02/2022   I, Wilburn Mylar, am acting as scribe for Truitt Merle, MD.   I have reviewed the above documentation for accuracy and completeness, and I agree with the above.

## 2022-06-05 ENCOUNTER — Ambulatory Visit: Payer: No Typology Code available for payment source | Admitting: Podiatry

## 2022-06-08 ENCOUNTER — Encounter: Payer: Self-pay | Admitting: Podiatry

## 2022-06-08 ENCOUNTER — Ambulatory Visit (INDEPENDENT_AMBULATORY_CARE_PROVIDER_SITE_OTHER): Payer: No Typology Code available for payment source | Admitting: Podiatry

## 2022-06-08 DIAGNOSIS — I739 Peripheral vascular disease, unspecified: Secondary | ICD-10-CM | POA: Diagnosis not present

## 2022-06-08 DIAGNOSIS — D6869 Other thrombophilia: Secondary | ICD-10-CM | POA: Diagnosis not present

## 2022-06-08 DIAGNOSIS — I70212 Atherosclerosis of native arteries of extremities with intermittent claudication, left leg: Secondary | ICD-10-CM | POA: Diagnosis not present

## 2022-06-08 DIAGNOSIS — Z6825 Body mass index (BMI) 25.0-25.9, adult: Secondary | ICD-10-CM | POA: Diagnosis not present

## 2022-06-08 DIAGNOSIS — I509 Heart failure, unspecified: Secondary | ICD-10-CM | POA: Diagnosis not present

## 2022-06-08 DIAGNOSIS — E1169 Type 2 diabetes mellitus with other specified complication: Secondary | ICD-10-CM | POA: Diagnosis not present

## 2022-06-08 DIAGNOSIS — E1159 Type 2 diabetes mellitus with other circulatory complications: Secondary | ICD-10-CM | POA: Diagnosis not present

## 2022-06-08 DIAGNOSIS — I4891 Unspecified atrial fibrillation: Secondary | ICD-10-CM | POA: Diagnosis not present

## 2022-06-08 DIAGNOSIS — Z794 Long term (current) use of insulin: Secondary | ICD-10-CM | POA: Diagnosis not present

## 2022-06-08 DIAGNOSIS — Z008 Encounter for other general examination: Secondary | ICD-10-CM | POA: Diagnosis not present

## 2022-06-08 DIAGNOSIS — I69351 Hemiplegia and hemiparesis following cerebral infarction affecting right dominant side: Secondary | ICD-10-CM | POA: Diagnosis not present

## 2022-06-08 DIAGNOSIS — E1142 Type 2 diabetes mellitus with diabetic polyneuropathy: Secondary | ICD-10-CM

## 2022-06-08 DIAGNOSIS — Z89611 Acquired absence of right leg above knee: Secondary | ICD-10-CM | POA: Diagnosis not present

## 2022-06-08 DIAGNOSIS — N1831 Chronic kidney disease, stage 3a: Secondary | ICD-10-CM | POA: Diagnosis not present

## 2022-06-08 DIAGNOSIS — E663 Overweight: Secondary | ICD-10-CM | POA: Diagnosis not present

## 2022-06-08 DIAGNOSIS — M7752 Other enthesopathy of left foot: Secondary | ICD-10-CM

## 2022-06-08 DIAGNOSIS — I25119 Atherosclerotic heart disease of native coronary artery with unspecified angina pectoris: Secondary | ICD-10-CM | POA: Diagnosis not present

## 2022-06-08 DIAGNOSIS — R2681 Unsteadiness on feet: Secondary | ICD-10-CM | POA: Diagnosis not present

## 2022-06-08 DIAGNOSIS — E1122 Type 2 diabetes mellitus with diabetic chronic kidney disease: Secondary | ICD-10-CM | POA: Diagnosis not present

## 2022-06-08 DIAGNOSIS — E785 Hyperlipidemia, unspecified: Secondary | ICD-10-CM | POA: Diagnosis not present

## 2022-06-08 DIAGNOSIS — E1151 Type 2 diabetes mellitus with diabetic peripheral angiopathy without gangrene: Secondary | ICD-10-CM | POA: Diagnosis not present

## 2022-06-08 DIAGNOSIS — I130 Hypertensive heart and chronic kidney disease with heart failure and stage 1 through stage 4 chronic kidney disease, or unspecified chronic kidney disease: Secondary | ICD-10-CM | POA: Diagnosis not present

## 2022-06-08 DIAGNOSIS — E261 Secondary hyperaldosteronism: Secondary | ICD-10-CM | POA: Diagnosis not present

## 2022-06-08 NOTE — Progress Notes (Signed)
  Subjective:  Patient ID: Tyler Whitehead, male    DOB: [birth year],   MRN: [id number]  Chief Complaint  Patient presents with   Nail Problem    Left foot swollen , great toe swelling. Ongoing patient states he has diabeties no known A1C    81 y.o. male presents for conern of left foot and great toe swelling that has been ongoing for several months. He is diabetic and history of PVD with right AKA. States he does have a family history of gout. Relates the toe will flare up and calm down. It will get warm and swollen and very painful. Concnerned for possible infection. Relates he has had anitbioitcs for it in the past.  . Denies any other pedal complaints. Denies n/v/f/c.   Past Medical History:  Diagnosis Date   CAD (coronary artery disease)    a. S/p 3V CABG 2000 with LIMA-LAD, left radial-PDA, SVG-OM1. b. 2014: stent to SVG-OM1; then found to have obstruction distal to LAD anastamosis of LIMA s/p DES.    CHF (congestive heart failure) ([place])    Diverticular disease    DM (diabetes mellitus) ([place])    Hemorrhoid    HTN (hypertension)    Hyperlipidemia    Peripheral vascular disease ([place])    a. R femoropopliteal bypass ~2012 in [place], reportedly shown to be occluded. Now followed by Dr. [name].   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CC)    RBBB     Objective:  Physical Exam: Vascular: DP/PT pulses 0/4 bilateral. CFT <3 seconds. Absent hair growth on digits. Edema noted to left lower extremities. Xerosis noted bilaterally.  Skin. No lacerations or abrasions bilateral feet. Nails 1-5 bilateral  are thickened discolored and elongated with subungual debris.  Musculoskeletal: MMT 5/5 bilateral lower extremities in DF, PF, Inversion and Eversion. Deceased ROM in DF of ankle joint. Edema noted around first MPJ and tenderness around MPJ and some into the IPJ of the left hallux. There is some darkening of the toe.  Neurological: Sensation intact to light touch. Protective sensation diminished bilateral.     Assessment:   1. Capsulitis of toe of left foot   2. Diabetic peripheral neuropathy associated with type 2 diabetes mellitus ([place])   3. Status post above-knee amputation of right lower extremity ([place])   4. PAD (peripheral artery disease) ([place])      Plan:  Patient was evaluated and treated and all questions answered. -Discussed possible causes of foot pain. Likely this is most related to significant PAD in this foot for which no current intervention. He also has neuropathy and family history of gout. Cannot rule out infection.  -Discussed treatement options for gouty arthritis and gout education provided. -Discussed diet and modifications.  -Medrol dose pack provided to see if this aids with soe inflammation.  -Ordered arthritic lab panel; will call patient with results if abnormal -Advised patient to call if symptoms are not improved within 1 week -Patient to return in 3 weeks for re-check/further discussion for long term management of gout or sooner if condition worsens.   Lorenda Peck, DPM

## 2022-06-21 DIAGNOSIS — R69 Illness, unspecified: Secondary | ICD-10-CM | POA: Diagnosis not present

## 2022-06-23 ENCOUNTER — Ambulatory Visit: Payer: No Typology Code available for payment source | Admitting: Podiatry

## 2022-07-02 ENCOUNTER — Encounter: Payer: Self-pay | Admitting: Podiatry

## 2022-07-02 ENCOUNTER — Ambulatory Visit (INDEPENDENT_AMBULATORY_CARE_PROVIDER_SITE_OTHER): Payer: No Typology Code available for payment source | Admitting: Podiatry

## 2022-07-02 DIAGNOSIS — E1142 Type 2 diabetes mellitus with diabetic polyneuropathy: Secondary | ICD-10-CM

## 2022-07-02 DIAGNOSIS — Z89611 Acquired absence of right leg above knee: Secondary | ICD-10-CM

## 2022-07-02 DIAGNOSIS — M7752 Other enthesopathy of left foot: Secondary | ICD-10-CM

## 2022-07-02 DIAGNOSIS — I739 Peripheral vascular disease, unspecified: Secondary | ICD-10-CM | POA: Diagnosis not present

## 2022-07-02 MED ORDER — METHYLPREDNISOLONE 4 MG PO TBPK: ORAL_TABLET | ORAL | 0 refills | Status: DC

## 2022-07-02 NOTE — Progress Notes (Signed)
  Subjective:  Patient ID: Tyler Whitehead, male    DOB: [birth year],   MRN: [id number]  Chief Complaint  Patient presents with   Gout    Patient states his gout in his left foot is getting betting , still some swelling , patient states he saw some discharge a couple days ago. Little to no pain. Patient states if he does have some pain it like a shooting pain   Nail Problem     Patient states left great toenail came off some , patient is concerned about left great toenail    81 y.o. male presents for follow-up of left foot and great toe swelling. Relates it is getting better but does still have some pain around the toe. Relates he never picked up the medrol dose pack from prior.  Never did get labs draw. Also relates concern for left great toenail coming off and concerned about his.  He is diabetic and history of PVD with right AKA. States he does have a family history of gout.  . Denies any other pedal complaints. Denies n/v/f/c.   Past Medical History:  Diagnosis Date   CAD (coronary artery disease)    a. S/p 3V CABG 2000 with LIMA-LAD, left radial-PDA, SVG-OM1. b. 2014: stent to SVG-OM1; then found to have obstruction distal to LAD anastamosis of LIMA s/p DES.    CHF (congestive heart failure) ([place])    Diverticular disease    DM (diabetes mellitus) ([employer])    Hemorrhoid    HTN (hypertension)    Hyperlipidemia    Peripheral vascular disease ([place])    a. R femoropopliteal bypass ~2012 in [place], reportedly shown to be occluded. Now followed by Dr. [name].   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CC)    RBBB     Objective:  Physical Exam: Vascular: DP/PT pulses 0/4 bilateral. CFT <3 seconds. Absent hair growth on digits. Edema noted to left lower extremities. Xerosis noted bilaterally.  Skin. No lacerations or abrasions bilateral feet. Nails 1-5 bilateral  are thickened discolored and elongated with subungual debris. Left hallux nail avulsed underlying nail bed healthy with no signs of infection or open  wounds noted.  Musculoskeletal: MMT 5/5 bilateral lower extremities in DF, PF, Inversion and Eversion. Deceased ROM in DF of ankle joint. Edema noted around first MPJ and tenderness around MPJ and some into the IPJ of the left hallux. There is some darkening of the toe.  Neurological: Sensation intact to light touch. Protective sensation diminished bilateral.    Assessment:   1. Capsulitis of toe of left foot   2. Diabetic peripheral neuropathy associated with type 2 diabetes mellitus ([place])   3. PAD (peripheral artery disease) ([place])   4. Status post above-knee amputation of right lower extremity ([place])       Plan:  Patient was evaluated and treated and all questions answered. -Discussed possible causes of foot pain. Likely this is most related to significant PAD in this foot for which no current intervention. He also has neuropathy and family history of gout. Cannot rule out infection.  -Discussed treatement options for gouty arthritis and gout education provided. -Discussed diet and modifications.  -Medrol dose pack sent in again for him.  -Dispensed surgical shoe to offload area as shoes have irritated his toe.  -Patient to return in 5 weeks for re-check gout and PAD or sooner if condition worsens.   Lorenda Peck, DPM

## 2022-07-05 DIAGNOSIS — R69 Illness, unspecified: Secondary | ICD-10-CM | POA: Diagnosis not present

## 2022-08-02 ENCOUNTER — Inpatient Hospital Stay: Payer: No Typology Code available for payment source

## 2022-08-02 ENCOUNTER — Inpatient Hospital Stay: Payer: No Typology Code available for payment source | Attending: Hematology

## 2022-08-02 DIAGNOSIS — D471 Chronic myeloproliferative disease: Secondary | ICD-10-CM | POA: Diagnosis not present

## 2022-08-02 DIAGNOSIS — D7581 Myelofibrosis: Secondary | ICD-10-CM

## 2022-08-02 LAB — CMP (CANCER CENTER ONLY)
ALT: 11 U/L (ref 0–44)
AST: 14 U/L — ABNORMAL LOW (ref 15–41)
Albumin: 3.7 g/dL (ref 3.5–5.0)
Alkaline Phosphatase: 77 U/L (ref 38–126)
Anion gap: 3 — ABNORMAL LOW (ref 5–15)
BUN: 17 mg/dL (ref 8–23)
CO2: 26 mmol/L (ref 22–32)
Calcium: 8.6 mg/dL — ABNORMAL LOW (ref 8.9–10.3)
Chloride: 108 mmol/L (ref 98–111)
Creatinine: 1.3 mg/dL — ABNORMAL HIGH (ref 0.61–1.24)
GFR, Estimated: 56 mL/min — ABNORMAL LOW (ref >60)
Glucose, Bld: 148 mg/dL — ABNORMAL HIGH (ref 70–99)
Potassium: 4.6 mmol/L (ref 3.5–5.1)
Sodium: 137 mmol/L (ref 135–145)
Total Bilirubin: 0.4 mg/dL (ref 0.3–1.2)
Total Protein: 5.8 g/dL — ABNORMAL LOW (ref 6.5–8.1)

## 2022-08-02 LAB — CBC WITH DIFFERENTIAL (CANCER CENTER ONLY)
Abs Immature Granulocytes: 0.19 10*3/uL — ABNORMAL HIGH (ref 0.00–0.07)
Basophils Absolute: 0.1 10*3/uL (ref 0.0–0.1)
Basophils Relative: 1 %
Eosinophils Absolute: 0.3 10*3/uL (ref 0.0–0.5)
Eosinophils Relative: 2 %
HCT: 34.2 % — ABNORMAL LOW (ref 39.0–52.0)
Hemoglobin: 11.6 g/dL — ABNORMAL LOW (ref 13.0–17.0)
Immature Granulocytes: 1 %
Lymphocytes Relative: 6 %
Lymphs Abs: 1 10*3/uL (ref 0.7–4.0)
MCH: 36.8 pg — ABNORMAL HIGH (ref 26.0–34.0)
MCHC: 33.9 g/dL (ref 30.0–36.0)
MCV: 108.6 fL — ABNORMAL HIGH (ref 80.0–100.0)
Monocytes Absolute: 0.7 10*3/uL (ref 0.1–1.0)
Monocytes Relative: 5 %
Neutro Abs: 12.6 10*3/uL — ABNORMAL HIGH (ref 1.7–7.7)
Neutrophils Relative %: 85 %
Platelet Count: 594 10*3/uL — ABNORMAL HIGH (ref 150–400)
RBC: 3.15 MIL/uL — ABNORMAL LOW (ref 4.22–5.81)
RDW: 18.6 % — ABNORMAL HIGH (ref 11.5–15.5)
WBC Count: 14.9 10*3/uL — ABNORMAL HIGH (ref 4.0–10.5)
nRBC: 0 % (ref 0.0–0.2)

## 2022-08-07 ENCOUNTER — Ambulatory Visit: Payer: No Typology Code available for payment source | Admitting: Podiatry

## 2022-08-08 NOTE — Telephone Encounter (Signed)
Patient called clinic and confirmed that he is taking his Hydrea. He understands that Dr. Burr Medico will recheck labs in 3 months.

## 2022-08-10 DIAGNOSIS — Z23 Encounter for immunization: Secondary | ICD-10-CM | POA: Diagnosis not present

## 2022-08-13 ENCOUNTER — Ambulatory Visit: Payer: No Typology Code available for payment source | Admitting: Podiatry

## 2022-08-13 DIAGNOSIS — L608 Other nail disorders: Secondary | ICD-10-CM

## 2022-08-14 DIAGNOSIS — E1140 Type 2 diabetes mellitus with diabetic neuropathy, unspecified: Secondary | ICD-10-CM | POA: Diagnosis not present

## 2022-08-14 DIAGNOSIS — R69 Illness, unspecified: Secondary | ICD-10-CM | POA: Diagnosis not present

## 2022-08-14 DIAGNOSIS — I10 Essential (primary) hypertension: Secondary | ICD-10-CM | POA: Diagnosis not present

## 2022-08-14 DIAGNOSIS — Z89619 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: Secondary | ICD-10-CM | POA: Diagnosis not present

## 2022-08-14 DIAGNOSIS — I739 Peripheral vascular disease, unspecified: Secondary | ICD-10-CM | POA: Diagnosis not present

## 2022-08-14 DIAGNOSIS — G8190 Hemiplegia, unspecified affecting unspecified side: Secondary | ICD-10-CM | POA: Diagnosis not present

## 2022-08-14 DIAGNOSIS — Z951 Presence of aortocoronary bypass graft: Secondary | ICD-10-CM | POA: Diagnosis not present

## 2022-08-14 DIAGNOSIS — D471 Chronic myeloproliferative disease: Secondary | ICD-10-CM | POA: Diagnosis not present

## 2022-08-14 DIAGNOSIS — Z8673 Personal history of transient ischemic attack (TIA), and cerebral infarction without residual deficits: Secondary | ICD-10-CM | POA: Diagnosis not present

## 2022-08-14 DIAGNOSIS — N182 Chronic kidney disease, stage 2 (mild): Secondary | ICD-10-CM | POA: Diagnosis not present

## 2022-08-14 DIAGNOSIS — I25709 Atherosclerosis of coronary artery bypass graft(s), unspecified, with unspecified angina pectoris: Secondary | ICD-10-CM | POA: Diagnosis not present

## 2022-08-14 DIAGNOSIS — Z89611 Acquired absence of right leg above knee: Secondary | ICD-10-CM | POA: Diagnosis not present

## 2022-08-14 DIAGNOSIS — E785 Hyperlipidemia, unspecified: Secondary | ICD-10-CM | POA: Diagnosis not present

## 2022-08-14 NOTE — Progress Notes (Signed)
  Subjective:  Patient ID: Tyler Whitehead, male    DOB: 1941/05/08,  MRN: [id number]  Chief Complaint  Patient presents with   Tinea Pedis   Foot Pain    Follow up left foot pain    81 y.o. male presents with the above complaint. History confirmed with patient.  He says the problem he was having with his toe is doing better he took care of himself and the nail eventually came off but is not giving him issues  Objective:  Physical Exam: Foot is warm, unable to palpate pulses, he does have previous removal of the left hallux nail at some point with a well-healed nailbed, no signs of infection Assessment:   1. Nontraumatic avulsion of toenail      Plan:  Patient was evaluated and treated and all questions answered.  He initially seemed quite frustrated with what he has had here so far.  He says that no one has helped him except Dr. Sherryle Lis, when I told him that was me he seemed less frustrated with it.  He says what ever was happening with his toe is no longer bothering him.  He will follow with [place] as needed.  Return if symptoms worsen or fail to improve.

## 2022-08-17 DIAGNOSIS — Z23 Encounter for immunization: Secondary | ICD-10-CM | POA: Diagnosis not present

## 2022-09-23 NOTE — Progress Notes (Unsigned)
Cardiology Clinic Note   Patient Name: Tyler Whitehead Date of Encounter: 09/25/2022  Primary Care Provider:  Wenda Low, MD Primary Cardiologist:  None  Patient Profile    Tyler Whitehead 81 year old male presents to the clinic today for follow-up evaluation of his coronary artery disease and hypertension.  Past Medical History    Past Medical History:  Diagnosis Date   CAD (coronary artery disease)    a. S/p 3V CABG 2000 with LIMA-LAD, left radial-PDA, SVG-OM1. b. 2014: stent to SVG-OM1; then found to have obstruction distal to LAD anastamosis of LIMA s/p DES.    CHF (congestive heart failure) ([place])    Diverticular disease    DM (diabetes mellitus) ([place])    Hemorrhoid    HTN (hypertension)    Hyperlipidemia    Peripheral vascular disease ([place])    a. R femoropopliteal bypass ~2012 in [place], reportedly shown to be occluded. Now followed by Dr. [name].   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[place])    RBBB    Past Surgical History:  Procedure Laterality Date   ABDOMINAL ANGIOGRAM N/A 01/18/2015   Procedure: ABDOMINAL ANGIOGRAM;  Surgeon: Serafina Mitchell, MD;  Location: [facility] CATH LAB;  Service: Cardiovascular;  Laterality: N/A;   ABDOMINAL AORTAGRAM N/A 09/21/2014   Procedure: ABDOMINAL Maxcine Ham;  Surgeon: Elam Dutch, MD;  Location: [facility] CATH LAB;  Service: Cardiovascular;  Laterality: N/A;   ABDOMINAL AORTOGRAM W/LOWER EXTREMITY N/A 03/28/2021   Procedure: ABDOMINAL AORTOGRAM W/LOWER EXTREMITY;  Surgeon: Serafina Mitchell, MD;  Location: [place] CV LAB;  Service: Cardiovascular;  Laterality: N/A;   CARDIAC CATHETERIZATION  May 2014   CORONARY ARTERY BYPASS GRAFT  2000   LM occluded, LIMA to LAD patent but distal native LAD disease, saphenous vein bypass graft to OM with 95% stenosis in the midportion, right coronary artery occluded, radial artery graft to right coronary artery patent but was diffusely small. Marginal graft stented. 7 2014. Of note he had multiple previous stents in the  right coronary artery. The distal LAD was treated with angioplasty. I   LOOP RECORDER IMPLANT N/A 07/12/2014   Procedure: LOOP RECORDER IMPLANT;  Surgeon: Evans Lance, MD;  Location: [facility] CATH LAB;  Service: Cardiovascular;  Laterality: N/A;   LOWER EXTREMITY ANGIOGRAM  09/21/2014   Procedure: LOWER EXTREMITY ANGIOGRAM;  Surgeon: Elam Dutch, MD;  Location: [facility] CATH LAB;  Service: Cardiovascular;;   PERCUTANEOUS CORONARY STENT INTERVENTION (PCI-S)  April 27 2013   right leg amputation  2017   TEE WITHOUT CARDIOVERSION N/A 07/12/2014   Procedure: TRANSESOPHAGEAL ECHOCARDIOGRAM (TEE);  Surgeon: Fay Records, MD;  Location: [facility] ENDOSCOPY;  Service: Cardiovascular;  Laterality: N/A;   VEIN SURGERY      Allergies  Allergies  Allergen Reactions   Other     Other reaction(s): Unknown    History of Present Illness    Tyler Whitehead has a PMH of coronary artery disease status post CABG x3, PVD, hypertension, CVA, diverticular disease, type 2 diabetes, dyslipidemia, seizure, vitamin D deficiency, memory deficit, right above-the-knee amputation, and gait abnormality.  Cardiac catheterization 05/11/2020 in [place] showed 100% left main and mid RCA in-stent lesions.  He was noted to have LIMA-LAD patent collaterals to RCA.  Patent SVG-OM1, occluded vein graft to RCA.  He was noted to have a 100% left SFA occlusion.  Medical management was recommended.  He underwent retrograde cannulation of his left tibial peroneal trunk with angioplasty.  His carotid Dopplers showed 40-50% stenosis in the left.  His echocardiogram showed an EF of 60%.  He was seen by Dr. Percival Spanish in 2017.  He had moved back to [place] and was relocating to the area.  His catheterization in [place] showed that medical therapy may enhance ongoing exertional chest discomfort.  He was noted to have ongoing exertional chest discomfort despite maximal medical management.  It was felt that he may benefit from PCI of his RCA.  It was felt that  due to his PMH is RCA may not remain open if PCI was not attempted.  His PVD was managed by Dr. Trula Slade.  He had a loop recorder implanted (Dr. Lovena Le) after he had CVA with unclear etiology.  He followed up with Dr. Percival Spanish 12/28/2021.  He continued to have chest discomfort however, it appeared to be in a stable exertional pattern.  He reported taking 3 bottles of nitroglycerin the previous year.  At the time of the evaluation he had not had any nitroglycerin in the last 2 or 3 weeks.  He noted his chest discomfort when he would move quickly with his walker and his prosthesis.  He denied chest discomfort at rest.  He denied orthopnea and PND.  He denied palpitations presyncope and syncope.  He felt that his symptoms were unchanged since his catheterization in 2021.  There was some confusion noted surrounding his medications.  Follow-up with pharmacy to review his medications was planned.  His Imdur was increased to 120 mg daily.  He presented to the clinic 03/30/22 for follow-up evaluation and stated he had been doing fairly well.  He reported that he had occasional falls due to weakness in his right upper extremity and his mobility.  His blood pressure was well controlled ,122/50.  He had been tolerating his medications well.  He also had some issues with his apartment and had been using a wheelchair occasionally to get around as needed.  He did well with physical therapy but needed assistance in doing follow-up exercises.  I  continue his  medication regimen, renewed physical therapy for another 6 weeks, had him continue his diet and planned follow-up in 6 months.  He presents to the clinic today for follow-up evaluation states he had been having intermittent episodes of sharp right sided chest pain. He takes nirtoglycerin three times per week at times but may not need to take nitroglycerin for a month at at time. He reports the chest pain with increased physical activity, "rushing". I reviewed options for  management and shared decision making was used to decide to increse his Imdur with 60 mg in the AM and 120 mg in the PM. He continues to be somewhat physically active and is working on a heaert health carb modified diet. He will have labs drawn at his PCP in the near future which we will request. I planned follow up in 3 months.  Today he denies chest pain, shortness of breath, lower extremity edema, fatigue, palpitations, melena, hematuria, hemoptysis, diaphoresis, weakness, presyncope, syncope, orthopnea, and PND.   Home Medications    Prior to Admission medications   Medication Sig Start Date End Date Taking? Authorizing Provider  aspirin 81 MG EC tablet 1 tablet Orally Once a day    [provider]  atorvastatin (LIPITOR) 40 MG tablet Take 40 mg by mouth at bedtime.    [provider]  carvedilol (COREG) 6.25 MG tablet Take 1 tablet (6.25 mg total) by mouth 2 (two) times daily with a meal. 12/28/21   Minus Breeding, MD  cephALEXin (KEFLEX) 500 MG capsule Take 500 mg by mouth 4 (four) times daily.    [provider]  cilostazol (PLETAL) 100 MG tablet Take 100 mg by mouth 2 (two) times daily.    [provider]  fluticasone (FLONASE) 50 MCG/ACT nasal spray 1 spray in each nostril 11/08/21   [provider]  hydroxyurea (HYDREA) 500 MG capsule Take 2 capsules in morning and 1 capsule in evening Patient taking differently: Take 2 capsules in morning. 06/26/21   Truitt Merle, MD  insulin glargine (LANTUS) 100 UNIT/ML injection Inject 10 Units into the skin at bedtime.    [provider]  insulin lispro (HUMALOG) 100 UNIT/ML injection Inject 4 Units into the skin daily as needed for high blood sugar.    [provider]  isosorbide mononitrate (IMDUR) 60 MG 24 hr tablet Take 2 tablets (120 mg total) by mouth daily. 01/16/22   Minus Breeding, MD  ketoconazole (NIZORAL) 2 % cream Apply 1 application topically daily. 11/21/21   McDonald, Stephan Minister,  DPM  Lancets (ONETOUCH DELICA PLUS [id number]) MISC Apply topically 2 (two) times daily. 12/28/20   [provider]  levocetirizine (XYZAL) 5 MG tablet Take 5 mg by mouth every evening.    [provider]  losartan (COZAAR) 25 MG tablet Take 25 mg by mouth at bedtime.    [provider]  mirtazapine (REMERON) 30 MG tablet 1 tablet at bedtime Orally Once a day    [provider]  nitroGLYCERIN (NITROSTAT) 0.4 MG SL tablet Place 1 tablet (0.4 mg total) under the tongue every 5 (five) minutes as needed for chest pain. 12/28/21   Minus Breeding, MD  [facility] VERIO test strip 1 each 2 (two) times daily. 01/25/21   [provider]  PROAIR HFA 108 (90 BASE) MCG/ACT inhaler Inhale 1 puff into the lungs every 4 (four) hours as needed for wheezing or shortness of breath.  12/03/13   [provider]  sitaGLIPtin (JANUVIA) 100 MG tablet 1 tablet Orally Once a day for 90 day(s) 06/26/18   [provider]  isosorbide mononitrate (IMDUR) 60 MG 24 hr tablet Take 1-2 tablets (60-120 mg total) by mouth 2 (two) times daily. 2 in the morning and 1 in the evening 09/22/20   Minus Breeding, MD    Family History    Family History  Problem Relation Age of Onset   Asthma Father    Heart disease Father    Hyperlipidemia Maternal Grandmother    CAD Other        Multiple siblings   Cancer Other        niece had lung cancer   Cancer Paternal Uncle        brain cancer    He indicated that his mother is deceased. He indicated that his father is deceased. He indicated that the status of his maternal grandmother is unknown. He indicated that the status of his paternal uncle is unknown. He indicated that the status of his other is unknown.  Social History    Social History   Socioeconomic History   Marital status: Divorced    Spouse name: Not on file   Number of children: Not on file   Years of education: Not on file   Highest education level: Not on file   Occupational History   Occupation: Limo driver  Tobacco Use   Smoking status: Never   Smokeless tobacco: Never  Substance and Sexual Activity   Alcohol use: Yes  Alcohol/week: 0.0 standard drinks of alcohol    Comment: sometimes    Drug use: No   Sexual activity: Not on file  Other Topics Concern   Not on file  Social History Narrative   Lives with wife.    Social Determinants of Health   Financial Resource Strain: Not on file  Food Insecurity: Not on file  Transportation Needs: Unmet Transportation Needs (12/28/2021)   PRAPARE - Hydrologist (Medical): Yes    Lack of Transportation (Non-Medical): Yes  Physical Activity: Not on file  Stress: Not on file  Social Connections: Not on file  Intimate Partner Violence: Not on file     Review of Systems    General:  No chills, fever, night sweats or weight changes.  Cardiovascular:  No chest pain, dyspnea on exertion, edema, orthopnea, palpitations, paroxysmal nocturnal dyspnea. Dermatological: No rash, lesions/masses Respiratory: No cough, dyspnea Urologic: No hematuria, dysuria Abdominal:   No nausea, vomiting, diarrhea, bright red blood per rectum, melena, or hematemesis Neurologic:  No visual changes, wkns, changes in mental status. All other systems reviewed and are otherwise negative except as noted above.  Physical Exam    VS:  BP 136/66 (BP Location: Right Arm, Patient Position: Sitting, Cuff Size: Normal)   Pulse 98   Ht '5\' 7"'$  (1.702 m)   Wt 164 lb 9.6 oz (74.7 kg)   BMI 25.78 kg/m  , BMI Body mass index is 25.78 kg/m. GEN: Well nourished, well developed, in no acute distress. HEENT: normal. Neck: Supple, no JVD, carotid bruits, or masses. Cardiac: RRR, no murmurs, rubs, or gallops. No clubbing, cyanosis, edema.  Radials/DP/PT 2+ and equal bilaterally.  Respiratory:  Respirations regular and unlabored, clear to auscultation bilaterally. GI: Soft, nontender, nondistended, BS + x  4. MS: no deformity or atrophy. Skin: warm and dry, no rash. Neuro:  Strength and sensation are intact. Psych: Normal affect.  Accessory Clinical Findings    Recent Labs: 08/02/2022: ALT 11; BUN 17; Creatinine 1.30; Hemoglobin 11.6; Platelet Count 594; Potassium 4.6; Sodium 137   Recent Lipid Panel    Component Value Date/Time   CHOL 94 07/11/2014 0430   TRIG 101 07/11/2014 0430   HDL 34 (L) 07/11/2014 0430   CHOLHDL 2.8 07/11/2014 0430   VLDL 20 07/11/2014 0430   LDLCALC 40 07/11/2014 0430    ECG personally reviewed by me today-normal sinus rhythm left axis deviation right bundle branch block left ventricular hypertrophy 98 bpm no acute changes.  Echocardiogram 07/12/2014 Indications:      CVA 436.                   Diagnostic  transesophageal echocardiography.  2D and color Doppler.  Birthdate:  Patient birthdate: [birth year].  Age:  Patient is 81 yr  old.  Sex:  Gender: male.    BMI: 30 kg/m^2.  Blood pressure:  139/80  Patient status:  Inpatient.  Study date:  Study date:  07/12/2014. Study time: 11:26 AM.  Location:  Endoscopy.   -------------------------------------------------------------------   -------------------------------------------------------------------  Left ventricle:  LVEF is normal.   -------------------------------------------------------------------  Aortic valve:  AV is minimally thickend. No AI.   -------------------------------------------------------------------  Aorta:  MIld fixed plaque in thoracic aorta.   -------------------------------------------------------------------  Mitral valve:  MV is normal MIld MR.   -------------------------------------------------------------------  Left atrium:   No evidence of thrombus in the atrial cavity or  appendage.  No evidence of thrombus in the atrial cavity or  appendage.   -------------------------------------------------------------------  Atrial septum:  No PFO as teste with injection of  agitated saline  or with color doppler.   -------------------------------------------------------------------  Pulmonic valve:   PV is normal.   -------------------------------------------------------------------  Tricuspid valve:  TV is normal No TR>     -------------------------------------------------------------------  Post procedure conclusions  Ascending Aorta:   - MIld fixed plaque in thoracic aorta.   -------------------------------------------------------------------  Prepared and Electronically Authenticated by   [name], M.D.     Assessment & Plan   1.  Coronary artery disease-denies chest pain today.  Continues with occasional episodes of chest discomfort with increased physical activity.  He is status post CABG x3 2000. Catheterization 05/11/2020 in [place] showed 100% left main and mid RCA in-stent lesions.  He was noted to have LIMA-LAD patent collaterals to RCA.  Patent SVG-OM1, occluded vein graft to RCA. Continue Imdur, carvedilol, atorvastatin, aspirin, nitroglycerin Take 60 mg of Imdur in the morning and 120 mg in the evening Heart healthy low-sodium diet reviewed Increase physical activity as tolerated-encouraged  Essential hypertension-BP today 136/66.  Well-controlled at home. Continue carvedilol, losartan Heart healthy low-sodium diet-salty 6 given Increase physical activity as tolerated  Hyperlipidemia-LDL 51 on 10/10/21 Continue aspirin, atorvastatin Heart healthy low-sodium high-fiber diet Increase physical activity as tolerated Plan for repeat lipids and LFTs 1/24  Peripheral vascular disease-status post right above-knee amputation (09/08/2021). Follows with [facility]  Falls-no recent episodes.  Continue increased physical activity.   Follows with PCP  Diabetes mellitus-glucose 148 on 08/02/22 Heart healthy low-sodium carb modified diet Continue insulin, Januvia  Disposition: Follow-up with Dr. Percival Spanish in 3-4 months.  Jossie Ng. Aleks Nawrot  NP-C    09/25/2022, 2:06 PM [place] [address] Office [phone] Fax [phone]  Notice: This dictation was prepared with Dragon dictation along with smaller phrase technology. Any transcriptional errors that result from this process are unintentional and may not be corrected upon review.  I spent 14 minutes examining this patient, reviewing medications, and using patient centered shared decision making involving her cardiac care.  Prior to her visit I spent greater than 20 minutes reviewing her past medical history,  medications, and prior cardiac tests.

## 2022-09-25 ENCOUNTER — Encounter: Payer: Self-pay | Admitting: General Practice

## 2022-09-25 ENCOUNTER — Ambulatory Visit: Payer: No Typology Code available for payment source | Attending: General Practice | Admitting: General Practice

## 2022-09-25 VITALS — BP 136/66 | HR 98 | Ht 67.0 in | Wt 164.6 lb

## 2022-09-25 DIAGNOSIS — E785 Hyperlipidemia, unspecified: Secondary | ICD-10-CM

## 2022-09-25 DIAGNOSIS — I25118 Atherosclerotic heart disease of native coronary artery with other forms of angina pectoris: Secondary | ICD-10-CM

## 2022-09-25 DIAGNOSIS — E118 Type 2 diabetes mellitus with unspecified complications: Secondary | ICD-10-CM

## 2022-09-25 DIAGNOSIS — I10 Essential (primary) hypertension: Secondary | ICD-10-CM

## 2022-09-25 DIAGNOSIS — I739 Peripheral vascular disease, unspecified: Secondary | ICD-10-CM | POA: Diagnosis not present

## 2022-09-25 MED ORDER — ISOSORBIDE MONONITRATE ER 60 MG PO TB24: ORAL_TABLET | ORAL | 2 refills | Status: DC

## 2022-09-25 NOTE — Patient Instructions (Signed)
Medication Instructions:  TAKE ISOSORBIDE '60MG'$  IN THE AM AND '120MG'$  IN THE PM *If you need a refill on your cardiac medications before your next appointment, please call your pharmacy*  Lab Work: NONE If you have labs (blood work) drawn today and your tests are completely normal, you will receive your results only by:  [place] (if you have MyChart) OR A paper copy in the mail If you have any lab test that is abnormal or we need to change your treatment, we will call you to review the results.  Testing/Procedures: NONE  Follow-Up: At [facility], you and your health needs are our priority.  As part of our continuing mission to provide you with exceptional heart care, we have created designated Provider Care Teams.  These Care Teams include your primary Cardiologist (physician) and Advanced Practice Providers (APPs -  Physician Assistants and Nurse Practitioners) who all work together to provide you with the care you need, when you need it.  Your next appointment:   3-4 month(s)  The format for your next appointment:   In Person  Provider:   Minus Breeding, MD    Other Instructions PLEASE READ AND FOLLOW ATTACHED  SALTY 6  INCREASE PHYSICAL ACTIVITY-AS TOLERATED  Important Information About Sugar

## 2022-09-26 DIAGNOSIS — Z008 Encounter for other general examination: Secondary | ICD-10-CM | POA: Diagnosis not present

## 2022-09-26 DIAGNOSIS — E663 Overweight: Secondary | ICD-10-CM | POA: Diagnosis not present

## 2022-09-26 DIAGNOSIS — Z6825 Body mass index (BMI) 25.0-25.9, adult: Secondary | ICD-10-CM | POA: Diagnosis not present

## 2022-10-04 NOTE — Addendum Note (Signed)
Addended by: Caprice Beaver T on: 10/04/2022 09:42 AM   Modules accepted: Orders

## 2022-10-14 ENCOUNTER — Other Ambulatory Visit: Payer: Self-pay | Admitting: Cardiology

## 2022-11-04 NOTE — Assessment & Plan Note (Signed)
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 Previously discussed this is not curable but treatable, risk of developing acute leukemia or aplastic anemia later on. -he is not a good candidate for stem cell transplant due to his advanced age and multiple medical comorbidities. -he tried [place] for 4 months in 2014, could not tolerate due to his anemia and fatigue. -He is aware of the importance of continued Hydrea and normalized his blood counts to prevent further stroke or thrombosis.  -he continues hydrea, tolerating well. He is supposed to be taking Hydrea to 1000 mg AM/500 mg PM daily but notes he is not taking the evening dose.

## 2022-11-05 ENCOUNTER — Encounter: Payer: Self-pay | Admitting: Hematology

## 2022-11-05 ENCOUNTER — Inpatient Hospital Stay: Payer: No Typology Code available for payment source

## 2022-11-05 ENCOUNTER — Other Ambulatory Visit: Payer: Self-pay

## 2022-11-05 ENCOUNTER — Inpatient Hospital Stay: Payer: No Typology Code available for payment source | Attending: Hematology | Admitting: Hematology

## 2022-11-05 VITALS — BP 143/68 | HR 81 | Resp 18 | Ht 67.0 in | Wt 162.6 lb

## 2022-11-05 DIAGNOSIS — D7581 Myelofibrosis: Secondary | ICD-10-CM

## 2022-11-05 DIAGNOSIS — Z79899 Other long term (current) drug therapy: Secondary | ICD-10-CM | POA: Insufficient documentation

## 2022-11-05 DIAGNOSIS — D473 Essential (hemorrhagic) thrombocythemia: Secondary | ICD-10-CM | POA: Diagnosis not present

## 2022-11-05 LAB — CMP (CANCER CENTER ONLY)
ALT: 12 U/L (ref 0–44)
AST: 13 U/L — ABNORMAL LOW (ref 15–41)
Albumin: 3.8 g/dL (ref 3.5–5.0)
Alkaline Phosphatase: 80 U/L (ref 38–126)
Anion gap: 3 — ABNORMAL LOW (ref 5–15)
BUN: 14 mg/dL (ref 8–23)
CO2: 32 mmol/L (ref 22–32)
Calcium: 9 mg/dL (ref 8.9–10.3)
Chloride: 104 mmol/L (ref 98–111)
Creatinine: 1.16 mg/dL (ref 0.61–1.24)
GFR, Estimated: >60 mL/min (ref >60)
Glucose, Bld: 229 mg/dL — ABNORMAL HIGH (ref 70–99)
Potassium: 4.2 mmol/L (ref 3.5–5.1)
Sodium: 139 mmol/L (ref 135–145)
Total Bilirubin: 0.8 mg/dL (ref 0.3–1.2)
Total Protein: 6.3 g/dL — ABNORMAL LOW (ref 6.5–8.1)

## 2022-11-05 LAB — CBC WITH DIFFERENTIAL (CANCER CENTER ONLY)
Abs Immature Granulocytes: 0.12 10*3/uL — ABNORMAL HIGH (ref 0.00–0.07)
Basophils Absolute: 0.2 10*3/uL — ABNORMAL HIGH (ref 0.0–0.1)
Basophils Relative: 1 %
Eosinophils Absolute: 0.3 10*3/uL (ref 0.0–0.5)
Eosinophils Relative: 2 %
HCT: 35 % — ABNORMAL LOW (ref 39.0–52.0)
Hemoglobin: 12.1 g/dL — ABNORMAL LOW (ref 13.0–17.0)
Immature Granulocytes: 1 %
Lymphocytes Relative: 6 %
Lymphs Abs: 0.9 10*3/uL (ref 0.7–4.0)
MCH: 37.9 pg — ABNORMAL HIGH (ref 26.0–34.0)
MCHC: 34.6 g/dL (ref 30.0–36.0)
MCV: 109.7 fL — ABNORMAL HIGH (ref 80.0–100.0)
Monocytes Absolute: 0.8 10*3/uL (ref 0.1–1.0)
Monocytes Relative: 5 %
Neutro Abs: 12.8 10*3/uL — ABNORMAL HIGH (ref 1.7–7.7)
Neutrophils Relative %: 85 %
Platelet Count: 400 10*3/uL (ref 150–400)
RBC: 3.19 MIL/uL — ABNORMAL LOW (ref 4.22–5.81)
RDW: 16.9 % — ABNORMAL HIGH (ref 11.5–15.5)
WBC Count: 15 10*3/uL — ABNORMAL HIGH (ref 4.0–10.5)
nRBC: 0 % (ref 0.0–0.2)

## 2022-11-05 MED ORDER — HYDROXYUREA 500 MG PO CAPS: ORAL_CAPSULE | ORAL | 5 refills | Status: DC

## 2022-11-05 NOTE — Progress Notes (Signed)
[place]   Telephone:(336) [phone] Fax:(336) [phone]   Clinic Follow up Note   Patient Care Team: Wenda Low, MD as PCP - General (Internal Medicine) Minus Breeding, MD as PCP - Cardiology (Cardiology) Debara Pickett Nadean Corwin, MD as Consulting Physician (Cardiology) Minus Breeding, MD as Consulting Physician (Cardiology) Cameron Sprang, MD as Consulting Physician (Neurology) Truitt Merle, MD as Attending Physician (Hematology and Oncology)  Date of Service:  11/05/2022  CHIEF COMPLAINT: f/u of  myelofibrosis   CURRENT THERAPY: Hydrea '1000mg'$  daily     ASSESSMENT:  Tyler Whitehead is a 82 y.o. 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 Previously discussed this is not curable but treatable, risk of developing acute leukemia or aplastic anemia later on. -he is not a good candidate for stem cell transplant due to his advanced age and multiple medical comorbidities. -he tried [place] for 4 months in 2014, could not tolerate due to his anemia and fatigue. -He is aware of the importance of continued Hydrea and normalized his blood counts to prevent further stroke or thrombosis.  -he continues hydrea, tolerating well. He is on Hydrea to 1000 mg daily and tolerates well  -Lab reviewed, platelet count normal, will continue Hydrea at the same dose    PLAN: -Lab in 3 months -Lab and follow-up in 6 months   SUMMARY OF ONCOLOGIC HISTORY: Oncology History  Myelofibrosis ([place])  12/26/2012 Bone Marrow Biopsy   Showed hypercellularity with reticulum fibrosis, JAK-2 mutation positive, BCR/ABL negative, 41 XY in 20 metaphase, and Intermediate-1 ,DIPSS of 2. (WBC < 25; Hbg > 10;    01/02/2013 Imaging   US abdomen complete revealed Mild splenogmealy (13.3 cm) and a 2.2 x 1.7 x 1.8 cm left renal cyst   01/15/2013 Pathology Results   Integrated hematopathology report.   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    03/17/2013 Imaging   CT Chest: Crowded peribronchial markings noted, associated with linear densities involving the left base, probably representing areas of platelike atelectasis/scarring.  No mass or infiltrate seen.    03/19/2013 - 07/20/2013 Chemotherapy   Started Jakafi 20 mg bid. Dose reduced due to anemia.  Stopped due to increasing fatigue while on jakafi.    04/28/2013 - 05/01/2013 Hospital Admission   Admitted to [facility] by Dr. Francia Greaves, his cardiologist, due to the fact that he had midsternal chest pain.  He had a coronary stent placed this admission.    05/28/2013 - 05/29/2013 Hospital Admission   Admitted to [facility] and received 3 units of blood (Dr. Lendon Colonel note).    06/10/2013 Surgery   Cololonoscopy due to GIB.  C/w diverticular disease and hemorrhoids but no active bleeding.    10/05/2013 -  Chemotherapy   Started Hydrea 500 mg daily.   03/19/2014 -  Chemotherapy   Hydrea increased to 500 mg bid wbc 17, hb 17, hct 54, plt 437   06/29/2014 Procedure   cbc shows wbc 18.8 hb 18.7 hct 58.9 plt 456. ?compliance w hydrea as MCV not elevated. symptomatic start TP weekly x 4   07/28/2014 Procedure   TP every 2 weeks if hct > 45, changed to monthly in April 2016      INTERVAL HISTORY:  Tyler Whitehead is here for a follow up of  myelofibrosis. He was last seen  by Laveda Norman on 05/02/2022 He presents to the clinic alone.  He is clinically stable, no new complaints.  He has been compliant with Hydrea, and has not missed any doses.  He denies any abdominal pain, bloating, nausea, or other GI symptoms.   All other systems were reviewed with the patient and are negative.  MEDICAL HISTORY:  Past Medical History:  Diagnosis Date   CAD (coronary artery disease)    a.  S/p 3V CABG 2000 with LIMA-LAD, left radial-PDA, SVG-OM1. b. 2014: stent to SVG-OM1; then found to have obstruction distal to LAD anastamosis of LIMA s/p DES.    CHF (congestive heart failure) ([place])    Diverticular disease    DM (diabetes mellitus) ([place])    Hemorrhoid    HTN (hypertension)    Hyperlipidemia    Peripheral vascular disease ([place])    a. R femoropopliteal bypass ~2012 in [place], reportedly shown to be occluded. Now followed by Dr. [name].   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[place])    RBBB     SURGICAL HISTORY: Past Surgical History:  Procedure Laterality Date   ABDOMINAL ANGIOGRAM N/A 01/18/2015   Procedure: ABDOMINAL ANGIOGRAM;  Surgeon: Serafina Mitchell, [place];  Location: [facility] CATH LAB;  Service: Cardiovascular;  Laterality: N/A;   ABDOMINAL AORTAGRAM N/A 09/21/2014   Procedure: ABDOMINAL Maxcine Ham;  Surgeon: Elam Dutch, [place];  Location: [facility] CATH LAB;  Service: Cardiovascular;  Laterality: N/A;   ABDOMINAL AORTOGRAM W/LOWER EXTREMITY N/A 03/28/2021   Procedure: ABDOMINAL AORTOGRAM W/LOWER EXTREMITY;  Surgeon: Serafina Mitchell, MD;  Location: [place] CV LAB;  Service: Cardiovascular;  Laterality: N/A;   CARDIAC CATHETERIZATION  May 2014   CORONARY ARTERY BYPASS GRAFT  2000   LM occluded, LIMA to LAD patent but distal native LAD disease, saphenous vein bypass graft to OM with 95% stenosis in the midportion, right coronary artery occluded, radial artery graft to right coronary artery patent but was diffusely small. Marginal graft stented. 7 2014. Of note he had multiple previous stents in the right coronary artery. The distal LAD was treated with angioplasty. I   LOOP RECORDER IMPLANT N/A 07/12/2014   Procedure: LOOP RECORDER IMPLANT;  Surgeon: Evans Lance, [place];  Location: [facility] CATH LAB;  Service: Cardiovascular;  Laterality: N/A;   LOWER EXTREMITY ANGIOGRAM  09/21/2014   Procedure: LOWER EXTREMITY ANGIOGRAM;  Surgeon: Elam Dutch, MD;  Location: [facility] CATH LAB;  Service: Cardiovascular;;    PERCUTANEOUS CORONARY STENT INTERVENTION (PCI-S)  April 27 2013   right leg amputation  2017   TEE WITHOUT CARDIOVERSION N/A 07/12/2014   Procedure: TRANSESOPHAGEAL ECHOCARDIOGRAM (TEE);  Surgeon: Fay Records, MD;  Location: [facility] ENDOSCOPY;  Service: Cardiovascular;  Laterality: N/A;   VEIN SURGERY      I have reviewed the social history and family history with the patient and they are unchanged from previous note.  ALLERGIES:  is allergic to other.  MEDICATIONS:  Current Outpatient Medications  Medication Sig Dispense Refill   aspirin 81 MG EC tablet 1 tablet Orally Once a day     atorvastatin (LIPITOR) 40 MG tablet Take 40 mg by mouth at bedtime.     carvedilol (COREG) 6.25 MG tablet Take 1 tablet (6.25 mg total) by mouth 2 (two) times daily with a meal. 180 tablet 3   cilostazol (PLETAL) 100 MG tablet Take 100 mg by mouth 2 (two) times daily.     fluticasone (FLONASE) 50 MCG/ACT nasal spray 1 spray in each nostril  hydroxyurea (HYDREA) 500 MG capsule Take 2 capsules in morning. 90 capsule 5   insulin glargine (LANTUS) 100 UNIT/ML injection Inject 10 Units into the skin at bedtime.     insulin lispro (HUMALOG) 100 UNIT/ML injection Inject 4 Units into the skin daily as needed for high blood sugar.     isosorbide mononitrate (IMDUR) 60 MG 24 hr tablet TAKE 2 TABLETS DAILY 180 tablet 2   Lancets (ONETOUCH DELICA PLUS [id number]) MISC Apply topically 2 (two) times daily.     levocetirizine (XYZAL) 5 MG tablet Take 5 mg by mouth every evening.     losartan (COZAAR) 25 MG tablet Take 25 mg by mouth at bedtime.     methylPREDNISolone (MEDROL DOSEPAK) 4 MG TBPK tablet Take as directed 21 tablet 0   nitroGLYCERIN (NITROSTAT) 0.4 MG SL tablet Place 1 tablet (0.4 mg total) under the tongue every 5 (five) minutes as needed for chest pain. 25 tablet 3   ONETOUCH VERIO test strip 1 each 2 (two) times daily.     PROAIR HFA 108 (90 BASE) MCG/ACT inhaler Inhale 1 puff into the lungs every 4 (four)  hours as needed for wheezing or shortness of breath.      No current facility-administered medications for this visit.    PHYSICAL EXAMINATION: ECOG PERFORMANCE STATUS: 2 - Symptomatic, <50% confined to bed  Vitals:   11/05/22 1240  BP: (!) 143/68  Pulse: 81  Resp: 18  SpO2: 100%   Wt Readings from Last 3 Encounters:  11/05/22 162 lb 9.6 oz (73.8 kg)  09/25/22 164 lb 9.6 oz (74.7 kg)  05/02/22 167 lb 6.4 oz (75.9 kg)    LABORATORY DATA:  I have reviewed the data as listed    Latest Ref Rng & Units 11/05/2022   11:19 AM 08/02/2022    1:45 PM 05/02/2022   11:10 AM  CBC  WBC 4.0 - 10.5 K/uL 15.0  14.9  16.4   Hemoglobin 13.0 - 17.0 g/dL 12.1  11.6  11.7   Hematocrit 39.0 - 52.0 % 35.0  34.2  35.4   Platelets 150 - 400 K/uL 400  594  459         Latest Ref Rng & Units 11/05/2022   11:19 AM 08/02/2022    1:45 PM 05/02/2022   11:10 AM  CMP  Glucose 70 - 99 mg/dL 229  148  296   BUN 8 - 23 mg/dL '14  17  22   '$ Creatinine 0.61 - 1.24 mg/dL 1.16  1.30  1.32   Sodium 135 - 145 mmol/L 139  137  138   Potassium 3.5 - 5.1 mmol/L 4.2  4.6  4.4   Chloride 98 - 111 mmol/L 104  108  107   CO2 22 - 32 mmol/L 32  26  28   Calcium 8.9 - 10.3 mg/dL 9.0  8.6  8.5   Total Protein 6.5 - 8.1 g/dL 6.3  5.8  5.9   Total Bilirubin 0.3 - 1.2 mg/dL 0.8  0.4  0.4   Alkaline Phos 38 - 126 U/L 80  77  80   AST 15 - 41 U/L '13  14  13   '$ ALT 0 - 44 U/L '12  11  15       '$ RADIOGRAPHIC STUDIES: I have personally reviewed the radiological images as listed and agreed with the findings in the report. No results found.    No orders of the defined types were placed in this encounter.  All questions were answered. The patient knows to call the clinic with any problems, questions or concerns. No barriers to learning was detected. The total time spent in the appointment was 15 minutes.     Truitt Merle, MD 11/05/2022   I, Maurine Simmering, CMA, am acting as scribe for Truitt Merle, MD.   I have reviewed the  above documentation for accuracy and completeness, and I agree with the above.

## 2022-11-20 DIAGNOSIS — Z9989 Dependence on other enabling machines and devices: Secondary | ICD-10-CM | POA: Diagnosis not present

## 2022-11-20 DIAGNOSIS — D471 Chronic myeloproliferative disease: Secondary | ICD-10-CM | POA: Diagnosis not present

## 2022-11-20 DIAGNOSIS — E1140 Type 2 diabetes mellitus with diabetic neuropathy, unspecified: Secondary | ICD-10-CM | POA: Diagnosis not present

## 2022-11-20 DIAGNOSIS — Z0000 Encounter for general adult medical examination without abnormal findings: Secondary | ICD-10-CM | POA: Diagnosis not present

## 2022-11-20 DIAGNOSIS — Z89619 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: Secondary | ICD-10-CM | POA: Diagnosis not present

## 2022-11-20 DIAGNOSIS — E1151 Type 2 diabetes mellitus with diabetic peripheral angiopathy without gangrene: Secondary | ICD-10-CM | POA: Diagnosis not present

## 2022-11-20 DIAGNOSIS — I739 Peripheral vascular disease, unspecified: Secondary | ICD-10-CM | POA: Diagnosis not present

## 2022-11-20 DIAGNOSIS — Z951 Presence of aortocoronary bypass graft: Secondary | ICD-10-CM | POA: Diagnosis not present

## 2022-11-20 DIAGNOSIS — Z9714 Presence of artificial left leg (complete) (partial): Secondary | ICD-10-CM | POA: Diagnosis not present

## 2022-11-20 DIAGNOSIS — I25709 Atherosclerosis of coronary artery bypass graft(s), unspecified, with unspecified angina pectoris: Secondary | ICD-10-CM | POA: Diagnosis not present

## 2022-11-20 DIAGNOSIS — Z8673 Personal history of transient ischemic attack (TIA), and cerebral infarction without residual deficits: Secondary | ICD-10-CM | POA: Diagnosis not present

## 2022-11-20 DIAGNOSIS — G8190 Hemiplegia, unspecified affecting unspecified side: Secondary | ICD-10-CM | POA: Diagnosis not present

## 2022-12-10 ENCOUNTER — Other Ambulatory Visit: Payer: Self-pay | Admitting: Cardiology

## 2022-12-21 ENCOUNTER — Ambulatory Visit: Payer: No Typology Code available for payment source | Admitting: Podiatry

## 2022-12-27 NOTE — Progress Notes (Signed)
Cardiology Office Note   Date:  12/28/2022   ID:  [name], Tyler Whitehead, MRN [id number]  PCP:  Wenda Low, MD  Cardiologist:   Minus Breeding, MD Referring:  Wenda Low, MD  Chief Complaint  Patient presents with   Chest Pain      History of Present Illness: Tyler Whitehead is a 82 y.o. male who presents for follow up of CAD/CABG.  I saw him last in 2017.  He moved to [place] and was moving back.   He has CAD he has had CABG and PVD. His last cath [place] suggested that "Medical therapy should be enhanced if the patient has ongoing exertional chest discomfort despite maximal medical management and if he has evidence of inferior ischemia on a perfusion study then repeat PCI of the native right coronary artery could be considered. However, given the patient's track record the right coronary artery is not likely to remain open even if PCI could be accomplished."  He is also managed for lower extremity disease as seen by Dr. Trula Slade.  He has had a CVA of unclear etiology had an implantable loop by Dr. Lovena Le.  Cardiac catheterization on 05/11/2020 in [place] demonstrated 100% left main and mid RCA in-stent lesions.  LIMA to the LAD apparently was patent collaterals to the right coronary artery.  He had a patent vein graft to an OM 1 with a patent stent in the vein graft.  He had an occluded vein graft to the RCA.  He had 100% left SFA occlusion.  He was managed medically.  He did have retrograde cannulation of his left tibial peroneal trunk and had this angioplasty.  Carotid Dopplers demonstrated 40 to 50% stenosis on the left.  Echo demonstrated an EF of 60%.  At the last visit he was having some chest discomfort consistent with a stable anginal pattern.  I titrated his beta-blocker.  Since I last saw him he has had continued chest discomfort.  This is severe and despite going up on his isosorbide.  He is not taking 180 mg although he has been taking 120 in the morning and 60 at night  and could have tachyphylaxis.  He describes chest pain similar to his previous angina.  It is a right upper sternal pain particular with exertion like recently carrying a very small piece of luggage.  He says the pain can get 10 out of 10.  He will have to take couple nitroglycerin occasionally.  It is not improved with medical management.  He gets associated shortness of breath.  Is not having any new nausea vomiting or diaphoresis.  He gets around with a walker and his prosthesis.  He had a fungus on his left foot.    Past Medical History:  Diagnosis Date   CAD (coronary artery disease)    a. S/p 3V CABG 2000 with LIMA-LAD, left radial-PDA, SVG-OM1. b. 2014: stent to SVG-OM1; then found to have obstruction distal to LAD anastamosis of LIMA s/p DES.    CHF (congestive heart failure) ([place])    Diverticular disease    DM (diabetes mellitus) ([place])    Hemorrhoid    HTN (hypertension)    Hyperlipidemia    Peripheral vascular disease ([place])    a. R femoropopliteal bypass ~2012 in [place], reportedly shown to be occluded. Now followed by Dr. [name].   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)    RBBB     Past Surgical History:  Procedure Laterality Date   ABDOMINAL ANGIOGRAM N/A  01/18/2015   Procedure: ABDOMINAL ANGIOGRAM;  Surgeon: Serafina Mitchell, [place];  Location: [facility] CATH LAB;  Service: Cardiovascular;  Laterality: N/A;   ABDOMINAL AORTAGRAM N/A 09/21/2014   Procedure: ABDOMINAL Maxcine Ham;  Surgeon: Elam Dutch, [place];  Location: [facility] CATH LAB;  Service: Cardiovascular;  Laterality: N/A;   ABDOMINAL AORTOGRAM W/LOWER EXTREMITY N/A 03/28/2021   Procedure: ABDOMINAL AORTOGRAM W/LOWER EXTREMITY;  Surgeon: Serafina Mitchell, MD;  Location: [place] CV LAB;  Service: Cardiovascular;  Laterality: N/A;   CARDIAC CATHETERIZATION  May 2014   CORONARY ARTERY BYPASS GRAFT  2000   LM occluded, LIMA to LAD patent but distal native LAD disease, saphenous vein bypass graft to OM with 95% stenosis in the midportion, right  coronary artery occluded, radial artery graft to right coronary artery patent but was diffusely small. Marginal graft stented. 7 2014. Of note he had multiple previous stents in the right coronary artery. The distal LAD was treated with angioplasty. I   LOOP RECORDER IMPLANT N/A 07/12/2014   Procedure: LOOP RECORDER IMPLANT;  Surgeon: Evans Lance, MD;  Location[facility] CATH LAB;  Service: Cardiovascular;  Laterality: N/A;   LOWER EXTREMITY ANGIOGRAM  09/21/2014   Procedure: LOWER EXTREMITY ANGIOGRAM;  Surgeon: Elam Dutch, MD;  Location: [facility] CATH LAB;  Service: Cardiovascular;;   PERCUTANEOUS CORONARY STENT INTERVENTION (PCI-S)  April 27 2013   right leg amputation  2017   TEE WITHOUT CARDIOVERSION N/A 07/12/2014   Procedure: TRANSESOPHAGEAL ECHOCARDIOGRAM (TEE);  Surgeon: Fay Records, MD;  Location: [facility] ENDOSCOPY;  Service: Cardiovascular;  Laterality: N/A;   VEIN SURGERY       Current Outpatient Medications  Medication Sig Dispense Refill   aspirin 81 MG EC tablet 1 tablet Orally Once a day     atorvastatin (LIPITOR) 40 MG tablet Take 40 mg by mouth at bedtime.     carvedilol (COREG) 6.25 MG tablet TAKE 1 TABLET TWICE DAILY  WITH MEALS (DOSE INCREASE) 180 tablet 3   cilostazol (PLETAL) 100 MG tablet Take 100 mg by mouth 2 (two) times daily.     fluticasone (FLONASE) 50 MCG/ACT nasal spray 1 spray in each nostril     hydroxyurea (HYDREA) 500 MG capsule Take 2 capsules in morning. 90 capsule 5   insulin glargine (LANTUS) 100 UNIT/ML injection Inject 10 Units into the skin at bedtime.     insulin lispro (HUMALOG) 100 UNIT/ML injection Inject 4 Units into the skin daily as needed for high blood sugar.     isosorbide mononitrate (IMDUR) 60 MG 24 hr tablet TAKE 2 TABLETS DAILY 180 tablet 2   Lancets (ONETOUCH DELICA PLUS [id number]) MISC Apply topically 2 (two) times daily.     levocetirizine (XYZAL) 5 MG tablet Take 5 mg by mouth every evening.     losartan (COZAAR) 25 MG tablet Take 25 mg by  mouth at bedtime.     methylPREDNISolone (MEDROL DOSEPAK) 4 MG TBPK tablet Take as directed 21 tablet 0   nitroGLYCERIN (NITROSTAT) 0.4 MG SL tablet Place 1 tablet (0.4 mg total) under the tongue every 5 (five) minutes as needed for chest pain. 25 tablet 3   ONETOUCH VERIO test strip 1 each 2 (two) times daily.     PROAIR HFA 108 (90 BASE) MCG/ACT inhaler Inhale 1 puff into the lungs every 4 (four) hours as needed for wheezing or shortness of breath.      No current facility-administered medications for this visit.    Allergies:   Other  ROS:  Please see the history of present illness.   Otherwise, review of systems are positive for none .   All other systems are reviewed and negative.    PHYSICAL EXAM: VS:  BP (!) 110/52   Pulse 85   Ht 5' 7" (1.702 m)   Wt 160 lb (72.6 kg)   SpO2 98%   BMI 25.06 kg/m  , BMI Body mass index is 25.06 kg/m. GENERAL:  Well appearing NECK:  No jugular venous distention, waveform within normal limits, carotid upstroke brisk and symmetric, no bruits, no thyromegaly LUNGS:  Clear to auscultation bilaterally CHEST:  Well healed sternotomy scar.  HEART:  PMI not displaced or sustained,S1 and S2 within normal limits, no S3, no S4, no clicks, no rubs, no murmurs ABD:  Flat, positive bowel sounds normal in frequency in pitch, no bruits, no rebound, no guarding, no midline pulsatile mass, no hepatomegaly, no splenomegaly EXT:  2 plus pulses upper with absent DP/PT on the left (difficult to access with bandage), no edema, no cyanosis no clubbing status post right AKA   EKG:  EKG is  ordered today. Sinus rhythm, rate 85, premature atrial contractions, low voltage in the limb lead, right bundle branch block, no acute ST-T wave changes.  Recent Labs: 11/05/2022: ALT 12; BUN 14; Creatinine 1.16; Hemoglobin 12.1; Platelet Count 400; Potassium 4.2; Sodium 139    Lipid Panel    Component Value Date/Time   CHOL 94 07/11/2014 0430   TRIG 101 07/11/2014 0430    HDL 34 (L) 07/11/2014 0430   CHOLHDL 2.8 07/11/2014 0430   VLDL 20 07/11/2014 0430   LDLCALC 40 07/11/2014 0430      Wt Readings from Last 3 Encounters:  12/28/22 160 lb (72.6 kg)  11/05/22 162 lb 9.6 oz (73.8 kg)  09/25/22 164 lb 9.6 oz (74.7 kg)      Other studies Reviewed: Additional studies/ records that were reviewed today include:  Labs Review of the above records demonstrates:  Please see elsewhere in the note.     ASSESSMENT AND PLAN:  CAD/CABG:    The patient is having what amounts to unstable angina with severe progressive exertional chest pain requiring more nitroglycerin despite medical management.   Cardiac cath is indicated.  The patient understands that risks included but are not limited to stroke (1 in 1000), death (1 in 1000), kidney failure [usually temporary] (1 in 500), bleeding (1 in 200), allergic reaction [possibly serious] (1 in 200).  The patient understands and agrees to proceed.  Of note he was previously right femoral access.  If necessary he could be right radial only if he cannot have femoral access knowing that we would not inject the LIMA.  HTN:  The blood pressure is at target.  No change in therapy.   DM:    A1c is 7.8 up from 7.0  PVD:    He has seen [facility] and is actively followed by podiatry.  HYPERLIPIDEMIA:  His LDL was 47 with an HDL of 27.    Current medicines are reviewed at length with the patient today.  The patient does not have concerns regarding medicines.  The following changes have been made: As above  Labs/ tests ordered today include:     Orders Placed This Encounter  Procedures   Basic metabolic panel   CBC   EKG 12-Lead    Disposition:   FU with APP in 3 months.     Signed, Deirdra Heumann, MD  12/28/2022 2:16   PM    [facility]

## 2022-12-27 NOTE — H&P (View-Only) (Signed)
Cardiology Office Note   Date:  12/28/2022   ID:  Tyler Whitehead, Tyler Whitehead [birth year], MRN [id number]  PCP:  Wenda Low, MD  Cardiologist:   Minus Breeding, MD Referring:  Wenda Low, MD  Chief Complaint  Patient presents with   Chest Pain      History of Present Illness: Tyler Whitehead is a 82 y.o. male who presents for follow up of CAD/CABG.  I saw him last in 2017.  He moved to [place] and was moving back.   He has CAD he has had CABG and PVD. His last cath [place] suggested that "Medical therapy should be enhanced if the patient has ongoing exertional chest discomfort despite maximal medical management and if he has evidence of inferior ischemia on a perfusion study then repeat PCI of the native right coronary artery could be considered. However, given the patient's track record the right coronary artery is not likely to remain open even if PCI could be accomplished."  He is also managed for lower extremity disease as seen by Dr. Trula Slade.  He has had a CVA of unclear etiology had an implantable loop by Dr. Lovena Le.  Cardiac catheterization on 05/11/2020 in [place] demonstrated 100% left main and mid RCA in-stent lesions.  LIMA to the LAD apparently was patent collaterals to the right coronary artery.  He had a patent vein graft to an OM 1 with a patent stent in the vein graft.  He had an occluded vein graft to the RCA.  He had 100% left SFA occlusion.  He was managed medically.  He did have retrograde cannulation of his left tibial peroneal trunk and had this angioplasty.  Carotid Dopplers demonstrated 40 to 50% stenosis on the left.  Echo demonstrated an EF of 60%.  At the last visit he was having some chest discomfort consistent with a stable anginal pattern.  I titrated his beta-blocker.  Since I last saw him he has had continued chest discomfort.  This is severe and despite going up on his isosorbide.  He is not taking 180 mg although he has been taking 120 in the morning and 60 at night  and could have tachyphylaxis.  He describes chest pain similar to his previous angina.  It is a right upper sternal pain particular with exertion like recently carrying a very small piece of luggage.  He says the pain can get 10 out of 10.  He will have to take couple nitroglycerin occasionally.  It is not improved with medical management.  He gets associated shortness of breath.  Is not having any new nausea vomiting or diaphoresis.  He gets around with a walker and his prosthesis.  He had a fungus on his left foot.    Past Medical History:  Diagnosis Date   CAD (coronary artery disease)    a. S/p 3V CABG 2000 with LIMA-LAD, left radial-PDA, SVG-OM1. b. 2014: stent to SVG-OM1; then found to have obstruction distal to LAD anastamosis of LIMA s/p DES.    CHF (congestive heart failure) ([place])    Diverticular disease    DM (diabetes mellitus) ([place])    Hemorrhoid    HTN (hypertension)    Hyperlipidemia    Peripheral vascular disease ([place])    a. R femoropopliteal bypass ~2012 in [place], reportedly shown to be occluded. Now followed by Dr. [name].   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[facility])    RBBB     Past Surgical History:  Procedure Laterality Date   ABDOMINAL ANGIOGRAM N/A  01/18/2015   Procedure: ABDOMINAL ANGIOGRAM;  Surgeon: Serafina Mitchell, [place];  Location: [facility] CATH LAB;  Service: Cardiovascular;  Laterality: N/A;   ABDOMINAL AORTAGRAM N/A 09/21/2014   Procedure: ABDOMINAL Maxcine Ham;  Surgeon: Elam Dutch, [place];  Location: [facility] CATH LAB;  Service: Cardiovascular;  Laterality: N/A;   ABDOMINAL AORTOGRAM W/LOWER EXTREMITY N/A 03/28/2021   Procedure: ABDOMINAL AORTOGRAM W/LOWER EXTREMITY;  Surgeon: Serafina Mitchell, MD;  Location: [place] CV LAB;  Service: Cardiovascular;  Laterality: N/A;   CARDIAC CATHETERIZATION  May 2014   CORONARY ARTERY BYPASS GRAFT  2000   LM occluded, LIMA to LAD patent but distal native LAD disease, saphenous vein bypass graft to OM with 95% stenosis in the midportion, right  coronary artery occluded, radial artery graft to right coronary artery patent but was diffusely small. Marginal graft stented. 7 2014. Of note he had multiple previous stents in the right coronary artery. The distal LAD was treated with angioplasty. I   LOOP RECORDER IMPLANT N/A 07/12/2014   Procedure: LOOP RECORDER IMPLANT;  Surgeon: Evans Lance, [place];  Location: [facility] CATH LAB;  Service: Cardiovascular;  Laterality: N/A;   LOWER EXTREMITY ANGIOGRAM  09/21/2014   Procedure: LOWER EXTREMITY ANGIOGRAM;  Surgeon: Elam Dutch, [place];  Location: [facility] CATH LAB;  Service: Cardiovascular;;   PERCUTANEOUS CORONARY STENT INTERVENTION (PCI-S)  April 27 2013   right leg amputation  2017   TEE WITHOUT CARDIOVERSION N/A 07/12/2014   Procedure: TRANSESOPHAGEAL ECHOCARDIOGRAM (TEE);  Surgeon: Fay Records, [place];  Location: [facility] ENDOSCOPY;  Service: Cardiovascular;  Laterality: N/A;   VEIN SURGERY       Current Outpatient Medications  Medication Sig Dispense Refill   aspirin 81 MG EC tablet 1 tablet Orally Once a day     atorvastatin (LIPITOR) 40 MG tablet Take 40 mg by mouth at bedtime.     carvedilol (COREG) 6.25 MG tablet TAKE 1 TABLET TWICE DAILY  WITH MEALS (DOSE INCREASE) 180 tablet 3   cilostazol (PLETAL) 100 MG tablet Take 100 mg by mouth 2 (two) times daily.     fluticasone (FLONASE) 50 MCG/ACT nasal spray 1 spray in each nostril     hydroxyurea (HYDREA) 500 MG capsule Take 2 capsules in morning. 90 capsule 5   insulin glargine (LANTUS) 100 UNIT/ML injection Inject 10 Units into the skin at bedtime.     insulin lispro (HUMALOG) 100 UNIT/ML injection Inject 4 Units into the skin daily as needed for high blood sugar.     isosorbide mononitrate (IMDUR) 60 MG 24 hr tablet TAKE 2 TABLETS DAILY 180 tablet 2   Lancets (ONETOUCH DELICA PLUS 123XX123) MISC Apply topically 2 (two) times daily.     levocetirizine (XYZAL) 5 MG tablet Take 5 mg by mouth every evening.     losartan (COZAAR) 25 MG tablet Take 25 mg by  mouth at bedtime.     methylPREDNISolone (MEDROL DOSEPAK) 4 MG TBPK tablet Take as directed 21 tablet 0   nitroGLYCERIN (NITROSTAT) 0.4 MG SL tablet Place 1 tablet (0.4 mg total) under the tongue every 5 (five) minutes as needed for chest pain. 25 tablet 3   ONETOUCH VERIO test strip 1 each 2 (two) times daily.     PROAIR HFA 108 (90 BASE) MCG/ACT inhaler Inhale 1 puff into the lungs every 4 (four) hours as needed for wheezing or shortness of breath.      No current facility-administered medications for this visit.    Allergies:   Other  ROS:  Please see the history of present illness.   Otherwise, review of systems are positive for none .   All other systems are reviewed and negative.    PHYSICAL EXAM: VS:  BP (!) 110/52   Pulse 85   Ht 5\' 7"  (1.702 m)   Wt 160 lb (72.6 kg)   SpO2 98%   BMI 25.06 kg/m  , BMI Body mass index is 25.06 kg/m. GENERAL:  Well appearing NECK:  No jugular venous distention, waveform within normal limits, carotid upstroke brisk and symmetric, no bruits, no thyromegaly LUNGS:  Clear to auscultation bilaterally CHEST:  Well healed sternotomy scar.  HEART:  PMI not displaced or sustained,S1 and S2 within normal limits, no S3, no S4, no clicks, no rubs, no murmurs ABD:  Flat, positive bowel sounds normal in frequency in pitch, no bruits, no rebound, no guarding, no midline pulsatile mass, no hepatomegaly, no splenomegaly EXT:  2 plus pulses upper with absent DP/PT on the left (difficult to access with bandage), no edema, no cyanosis no clubbing status post right AKA   EKG:  EKG is  ordered today. Sinus rhythm, rate 85, premature atrial contractions, low voltage in the limb lead, right bundle branch block, no acute ST-T wave changes.  Recent Labs: 11/05/2022: ALT 12; BUN 14; Creatinine 1.16; Hemoglobin 12.1; Platelet Count 400; Potassium 4.2; Sodium 139    Lipid Panel    Component Value Date/Time   CHOL 94 07/11/2014 0430   TRIG 101 07/11/2014 0430    HDL 34 (L) 07/11/2014 0430   CHOLHDL 2.8 07/11/2014 0430   VLDL 20 07/11/2014 0430   LDLCALC 40 07/11/2014 0430      Wt Readings from Last 3 Encounters:  12/28/22 160 lb (72.6 kg)  11/05/22 162 lb 9.6 oz (73.8 kg)  09/25/22 164 lb 9.6 oz (74.7 kg)      Other studies Reviewed: Additional studies/ records that were reviewed today include:  Labs Review of the above records demonstrates:  Please see elsewhere in the note.     ASSESSMENT AND PLAN:  CAD/CABG:    The patient is having what amounts to unstable angina with severe progressive exertional chest pain requiring more nitroglycerin despite medical management.   Cardiac cath is indicated.  The patient understands that risks included but are not limited to stroke (1 in 1000), death (1 in 50), kidney failure [usually temporary] (1 in 500), bleeding (1 in 200), allergic reaction [possibly serious] (1 in 200).  The patient understands and agrees to proceed.  Of note he was previously right femoral access.  If necessary he could be right radial only if he cannot have femoral access knowing that we would not inject the LIMA.  HTN:  The blood pressure is at target.  No change in therapy.   DM:    A1c is 7.8 up from 7.0  PVD:    He has seen [facility] and is actively followed by podiatry.  HYPERLIPIDEMIA:  His LDL was 47 with an HDL of 27.    Current medicines are reviewed at length with the patient today.  The patient does not have concerns regarding medicines.  The following changes have been made: As above  Labs/ tests ordered today include:     Orders Placed This Encounter  Procedures   Basic metabolic panel   CBC   EKG 12-Lead    Disposition:   FU with APP in 3 months.     Signed, Minus Breeding, MD  12/28/2022 2:16  PM    [facility]

## 2022-12-28 ENCOUNTER — Ambulatory Visit: Payer: No Typology Code available for payment source | Attending: Cardiology | Admitting: Cardiology

## 2022-12-28 ENCOUNTER — Encounter: Payer: Self-pay | Admitting: Cardiology

## 2022-12-28 VITALS — BP 110/52 | HR 85 | Ht 67.0 in | Wt 160.0 lb

## 2022-12-28 DIAGNOSIS — E785 Hyperlipidemia, unspecified: Secondary | ICD-10-CM | POA: Diagnosis not present

## 2022-12-28 DIAGNOSIS — I10 Essential (primary) hypertension: Secondary | ICD-10-CM | POA: Diagnosis not present

## 2022-12-28 DIAGNOSIS — E118 Type 2 diabetes mellitus with unspecified complications: Secondary | ICD-10-CM | POA: Diagnosis not present

## 2022-12-28 DIAGNOSIS — I25118 Atherosclerotic heart disease of native coronary artery with other forms of angina pectoris: Secondary | ICD-10-CM

## 2022-12-28 NOTE — Patient Instructions (Addendum)
Medication Instructions:  Your physician recommends that you continue on your current medications as directed. Please refer to the Current Medication list given to you today.  *If you need a refill on your cardiac medications before your next appointment, please call your pharmacy*   Lab Work: Your physician recommends that you have labs drawn today: BMET & CBC  If you have labs (blood work) drawn today and your tests are completely normal, you will receive your results only by: [place] (if you have MyChart) OR A paper copy in the mail If you have any lab test that is abnormal or we need to change your treatment, we will call you to review the results.   Testing/Procedures: See below   Follow-Up: At [facility], you and your health needs are our priority.  As part of our continuing mission to provide you with exceptional heart care, we have created designated Provider Care Teams.  These Care Teams include your primary Cardiologist (physician) and Advanced Practice Providers (APPs -  Physician Assistants and Nurse Practitioners) who all work together to provide you with the care you need, when you need it.  We recommend signing up for the patient portal called "MyChart".  Sign up information is provided on this After Visit Summary.  MyChart is used to connect with patients for Virtual Visits (Telemedicine).  Patients are able to view lab/test results, encounter notes, upcoming appointments, etc.  Non-urgent messages can be sent to your provider as well.   To learn more about what you can do with MyChart, go to [email or web address].    Your next appointment:   2 week(s) post cath (3/28)  Provider:   Minus Breeding, MD     Other Instructions       Cardiac/Peripheral Catheterization   You are scheduled for a Cardiac Catheterization on Thursday, March 28 with Dr. Peter Martinique.  1. Please arrive at the Main Entrance A at [facility] [place]: [place],  [address] on March 28 at 5:30 AM (This time is two hours before your procedure to ensure your preparation). Free valet parking service is available. You will check in at ADMITTING. The support person will be asked to wait in the waiting room.  It is OK to have someone drop you off and come back when you are ready to be discharged.        Special note: Every effort is made to have your procedure done on time. Please understand that emergencies sometimes delay scheduled procedures.   . 2. Diet: Do not eat solid foods after midnight.  You may have clear liquids until 5 AM the day of the procedure.  3. Labs: You will need to have blood drawn today (3/22)  4. Medication instructions in preparation for your procedure:  Take only 5 units of insulin the night before your procedure. Do not take any insulin on the day of the procedure.   On the morning of your procedure, take Aspirin 81 mg and any morning medicines NOT listed above.  You may use sips of water.  5. Plan to go home the same day, you will only stay overnight if medically necessary. 6. You MUST have a responsible adult to drive you home. 7. An adult MUST be with you the first 24 hours after you arrive home. 8. Bring a current list of your medications, and the last time and date medication taken. 9. Bring ID and current insurance cards. 10.Please wear clothes that are easy  to get on and off and wear slip-on shoes.  Thank you for allowing [place] to care for you!   -- [facility]

## 2022-12-29 LAB — CBC
Hematocrit: 31.2 % — ABNORMAL LOW (ref 37.5–51.0)
Hemoglobin: 11.1 g/dL — ABNORMAL LOW (ref 13.0–17.7)
MCH: 35.8 pg — ABNORMAL HIGH (ref 26.6–33.0)
MCHC: 35.6 g/dL (ref 31.5–35.7)
MCV: 101 fL — ABNORMAL HIGH (ref 79–97)
Platelets: 444 10*3/uL (ref 150–450)
RBC: 3.1 x10E6/uL — ABNORMAL LOW (ref 4.14–5.80)
RDW: 15.7 % — ABNORMAL HIGH (ref 11.6–15.4)
WBC: 19.7 10*3/uL — ABNORMAL HIGH (ref 3.4–10.8)

## 2022-12-29 LAB — BASIC METABOLIC PANEL
BUN/Creatinine Ratio: 20 (ref 10–24)
BUN: 23 mg/dL (ref 8–27)
CO2: 24 mmol/L (ref 20–29)
Calcium: 8.8 mg/dL (ref 8.6–10.2)
Chloride: 104 mmol/L (ref 96–106)
Creatinine, Ser: 1.17 mg/dL (ref 0.76–1.27)
Glucose: 220 mg/dL — ABNORMAL HIGH (ref 70–99)
Potassium: 4.8 mmol/L (ref 3.5–5.2)
Sodium: 141 mmol/L (ref 134–144)
eGFR: 63 mL/min/{1.73_m2} (ref >59)

## 2023-01-02 ENCOUNTER — Telehealth: Payer: Self-pay | Admitting: *Deleted

## 2023-01-02 NOTE — Telephone Encounter (Signed)
Cardiac Catheterization scheduled at [facility] for:  Thursday January 03, 2023 7:30 AM Arrival time [place] Entrance A at: 5:30 AM  Nothing to eat after midnight prior to procedure, clear liquids until 5 AM day of procedure.  Medication instructions: -Hold:  Insulin-AM of procedure/1/2 usual dose HS prior to procedure -Other usual morning medications can be taken with sips of water including aspirin 81 mg.  Confirmed patient has responsible adult to drive home post procedure and be with patient first 24 hours after arriving home.  Plan to go home the same day, you will only stay overnight if medically necessary.  Reviewed procedure instructions with patient.

## 2023-01-03 ENCOUNTER — Ambulatory Visit (HOSPITAL_COMMUNITY)
Admission: RE | Admit: 2023-01-03 | Discharge: 2023-01-04 | Disposition: A | Discharge status: Home / Self Care | Payer: No Typology Code available for payment source | Attending: Cardiology | Admitting: Cardiology

## 2023-01-03 ENCOUNTER — Other Ambulatory Visit: Payer: Self-pay

## 2023-01-03 ENCOUNTER — Ambulatory Visit (HOSPITAL_COMMUNITY)
Admission: RE | Disposition: A | Discharge status: Home / Self Care | Payer: Self-pay | Source: Home / Self Care | Attending: Cardiology

## 2023-01-03 DIAGNOSIS — Z7902 Long term (current) use of antithrombotics/antiplatelets: Secondary | ICD-10-CM | POA: Insufficient documentation

## 2023-01-03 DIAGNOSIS — Z79899 Other long term (current) drug therapy: Secondary | ICD-10-CM | POA: Insufficient documentation

## 2023-01-03 DIAGNOSIS — Z955 Presence of coronary angioplasty implant and graft: Principal | ICD-10-CM

## 2023-01-03 DIAGNOSIS — I25719 Atherosclerosis of autologous vein coronary artery bypass graft(s) with unspecified angina pectoris: Secondary | ICD-10-CM

## 2023-01-03 DIAGNOSIS — I10 Essential (primary) hypertension: Secondary | ICD-10-CM | POA: Diagnosis not present

## 2023-01-03 DIAGNOSIS — I25119 Atherosclerotic heart disease of native coronary artery with unspecified angina pectoris: Secondary | ICD-10-CM

## 2023-01-03 DIAGNOSIS — Z794 Long term (current) use of insulin: Secondary | ICD-10-CM | POA: Diagnosis not present

## 2023-01-03 DIAGNOSIS — I209 Angina pectoris, unspecified: Secondary | ICD-10-CM | POA: Diagnosis present

## 2023-01-03 DIAGNOSIS — Z7982 Long term (current) use of aspirin: Secondary | ICD-10-CM | POA: Diagnosis not present

## 2023-01-03 DIAGNOSIS — Z8673 Personal history of transient ischemic attack (TIA), and cerebral infarction without residual deficits: Secondary | ICD-10-CM | POA: Diagnosis not present

## 2023-01-03 DIAGNOSIS — E1151 Type 2 diabetes mellitus with diabetic peripheral angiopathy without gangrene: Secondary | ICD-10-CM | POA: Diagnosis not present

## 2023-01-03 DIAGNOSIS — I25708 Atherosclerosis of coronary artery bypass graft(s), unspecified, with other forms of angina pectoris: Secondary | ICD-10-CM | POA: Insufficient documentation

## 2023-01-03 DIAGNOSIS — D471 Chronic myeloproliferative disease: Secondary | ICD-10-CM | POA: Diagnosis not present

## 2023-01-03 DIAGNOSIS — E785 Hyperlipidemia, unspecified: Secondary | ICD-10-CM | POA: Diagnosis not present

## 2023-01-03 DIAGNOSIS — I2584 Coronary atherosclerosis due to calcified coronary lesion: Secondary | ICD-10-CM | POA: Insufficient documentation

## 2023-01-03 DIAGNOSIS — I2582 Chronic total occlusion of coronary artery: Secondary | ICD-10-CM | POA: Diagnosis not present

## 2023-01-03 HISTORY — PX: CORONARY STENT INTERVENTION: CATH118234

## 2023-01-03 HISTORY — PX: LEFT HEART CATH AND CORS/GRAFTS ANGIOGRAPHY: CATH118250

## 2023-01-03 LAB — CBC
HCT: 37.3 % — ABNORMAL LOW (ref 39.0–52.0)
Hemoglobin: 12.4 g/dL — ABNORMAL LOW (ref 13.0–17.0)
MCH: 35.7 pg — ABNORMAL HIGH (ref 26.0–34.0)
MCHC: 33.2 g/dL (ref 30.0–36.0)
MCV: 107.5 fL — ABNORMAL HIGH (ref 80.0–100.0)
Platelets: 470 10*3/uL — ABNORMAL HIGH (ref 150–400)
RBC: 3.47 MIL/uL — ABNORMAL LOW (ref 4.22–5.81)
RDW: 16.9 % — ABNORMAL HIGH (ref 11.5–15.5)
WBC: 12.1 10*3/uL — ABNORMAL HIGH (ref 4.0–10.5)
nRBC: 0.3 % — ABNORMAL HIGH (ref 0.0–0.2)

## 2023-01-03 LAB — POCT ACTIVATED CLOTTING TIME
Activated Clotting Time: 353 seconds
Activated Clotting Time: 212 seconds
Activated Clotting Time: 228 seconds
Activated Clotting Time: 179 seconds

## 2023-01-03 LAB — GLUCOSE, CAPILLARY
Glucose-Capillary: 129 mg/dL — ABNORMAL HIGH (ref 70–99)
Glucose-Capillary: 119 mg/dL — ABNORMAL HIGH (ref 70–99)
Glucose-Capillary: 95 mg/dL (ref 70–99)
Glucose-Capillary: 154 mg/dL — ABNORMAL HIGH (ref 70–99)

## 2023-01-03 LAB — CREATININE, SERUM
Creatinine, Ser: 1.17 mg/dL (ref 0.61–1.24)
GFR, Estimated: >60 mL/min (ref >60)

## 2023-01-03 SURGERY — LEFT HEART CATH AND CORS/GRAFTS ANGIOGRAPHY
Anesthesia: LOCAL

## 2023-01-03 MED ORDER — SODIUM CHLORIDE 0.9 % IV SOLN: 250.0000 mL | INTRAVENOUS | Status: DC | PRN

## 2023-01-03 MED ORDER — LORATADINE 10 MG PO TABS
10.0000 mg | ORAL_TABLET | Freq: Every evening | ORAL | Status: DC
  Administered 2023-01-03: 10 mg via ORAL
  Filled 2023-01-03: qty 1

## 2023-01-03 MED ORDER — LIDOCAINE HCL (PF) 1 % IJ SOLN
INTRAMUSCULAR | Status: DC | PRN
  Administered 2023-01-03: 12 mL

## 2023-01-03 MED ORDER — SODIUM CHLORIDE 0.9 % WEIGHT BASED INFUSION: 1.0000 mL/kg/h | INTRAVENOUS | Status: DC

## 2023-01-03 MED ORDER — HEPARIN (PORCINE) IN NACL 1000-0.9 UT/500ML-% IV SOLN
INTRAVENOUS | Status: DC | PRN
  Administered 2023-01-03 (×3): 500 mL

## 2023-01-03 MED ORDER — CILOSTAZOL 100 MG PO TABS
200.0000 mg | ORAL_TABLET | Freq: Two times a day (BID) | ORAL | Status: DC
  Administered 2023-01-03: 200 mg via ORAL
  Filled 2023-01-03 (×2): qty 2

## 2023-01-03 MED ORDER — NITROGLYCERIN 0.4 MG SL SUBL: 0.4000 mg | SUBLINGUAL_TABLET | SUBLINGUAL | Status: DC | PRN

## 2023-01-03 MED ORDER — SODIUM CHLORIDE 0.9 % WEIGHT BASED INFUSION
3.0000 mL/kg/h | INTRAVENOUS | Status: DC
  Administered 2023-01-03: 3 mL/kg/h via INTRAVENOUS

## 2023-01-03 MED ORDER — CLOPIDOGREL BISULFATE 75 MG PO TABS
75.0000 mg | ORAL_TABLET | Freq: Every day | ORAL | Status: DC
  Administered 2023-01-04: 75 mg via ORAL
  Filled 2023-01-03: qty 1

## 2023-01-03 MED ORDER — MIDAZOLAM HCL 2 MG/2ML IJ SOLN
INTRAMUSCULAR | Status: DC | PRN
  Administered 2023-01-03 (×2): 1 mg via INTRAVENOUS

## 2023-01-03 MED ORDER — HEPARIN SODIUM (PORCINE) 1000 UNIT/ML IJ SOLN
INTRAMUSCULAR | Status: DC | PRN
  Administered 2023-01-03: 7000 [IU] via INTRAVENOUS

## 2023-01-03 MED ORDER — ALBUTEROL SULFATE (2.5 MG/3ML) 0.083% IN NEBU: 2.5000 mg | INHALATION_SOLUTION | RESPIRATORY_TRACT | Status: DC | PRN

## 2023-01-03 MED ORDER — LIDOCAINE HCL (PF) 1 % IJ SOLN
INTRAMUSCULAR | Status: AC
  Filled 2023-01-03: qty 30

## 2023-01-03 MED ORDER — ONDANSETRON HCL 4 MG/2ML IJ SOLN: 4.0000 mg | Freq: Four times a day (QID) | INTRAMUSCULAR | Status: DC | PRN

## 2023-01-03 MED ORDER — SODIUM CHLORIDE 0.9% FLUSH: 3.0000 mL | INTRAVENOUS | Status: DC | PRN

## 2023-01-03 MED ORDER — INSULIN ASPART 100 UNIT/ML IJ SOLN: 0.0000 [IU] | Freq: Three times a day (TID) | INTRAMUSCULAR | Status: DC

## 2023-01-03 MED ORDER — ISOSORBIDE MONONITRATE ER 60 MG PO TB24
120.0000 mg | ORAL_TABLET | Freq: Every day | ORAL | Status: DC
  Administered 2023-01-03 – 2023-01-04 (×2): 120 mg via ORAL
  Filled 2023-01-03 (×2): qty 2

## 2023-01-03 MED ORDER — NITROGLYCERIN 1 MG/10 ML FOR IR/CATH LAB
INTRA_ARTERIAL | Status: AC
  Filled 2023-01-03: qty 10

## 2023-01-03 MED ORDER — ACETAMINOPHEN 325 MG PO TABS: 650.0000 mg | ORAL_TABLET | ORAL | Status: DC | PRN

## 2023-01-03 MED ORDER — MIDAZOLAM HCL 2 MG/2ML IJ SOLN
INTRAMUSCULAR | Status: AC
  Filled 2023-01-03: qty 2

## 2023-01-03 MED ORDER — SODIUM CHLORIDE 0.9 % WEIGHT BASED INFUSION
1.0000 mL/kg/h | INTRAVENOUS | Status: AC
  Administered 2023-01-03: 1 mL/kg/h via INTRAVENOUS

## 2023-01-03 MED ORDER — LOSARTAN POTASSIUM 25 MG PO TABS
25.0000 mg | ORAL_TABLET | Freq: Every day | ORAL | Status: DC
  Administered 2023-01-03: 25 mg via ORAL
  Filled 2023-01-03: qty 1

## 2023-01-03 MED ORDER — FENTANYL CITRATE (PF) 100 MCG/2ML IJ SOLN
INTRAMUSCULAR | Status: AC
  Filled 2023-01-03: qty 2

## 2023-01-03 MED ORDER — FENTANYL CITRATE (PF) 100 MCG/2ML IJ SOLN
INTRAMUSCULAR | Status: DC | PRN
  Administered 2023-01-03: 50 ug via INTRAVENOUS
  Administered 2023-01-03: 25 ug via INTRAVENOUS

## 2023-01-03 MED ORDER — CLOPIDOGREL BISULFATE 300 MG PO TABS
ORAL_TABLET | ORAL | Status: DC | PRN
  Administered 2023-01-03: 600 mg via ORAL

## 2023-01-03 MED ORDER — ENOXAPARIN SODIUM 40 MG/0.4ML IJ SOSY
40.0000 mg | PREFILLED_SYRINGE | INTRAMUSCULAR | Status: DC
  Administered 2023-01-04: 40 mg via SUBCUTANEOUS
  Filled 2023-01-03: qty 0.4

## 2023-01-03 MED ORDER — VERAPAMIL HCL 2.5 MG/ML IV SOLN
INTRAVENOUS | Status: AC
  Filled 2023-01-03: qty 2

## 2023-01-03 MED ORDER — CARVEDILOL 6.25 MG PO TABS
6.2500 mg | ORAL_TABLET | Freq: Two times a day (BID) | ORAL | Status: DC
  Administered 2023-01-03 – 2023-01-04 (×2): 6.25 mg via ORAL
  Filled 2023-01-03 (×2): qty 1

## 2023-01-03 MED ORDER — SODIUM CHLORIDE 0.9% FLUSH
3.0000 mL | Freq: Two times a day (BID) | INTRAVENOUS | Status: DC
  Administered 2023-01-03 – 2023-01-04 (×2): 3 mL via INTRAVENOUS

## 2023-01-03 MED ORDER — ASPIRIN 81 MG PO TBEC
81.0000 mg | DELAYED_RELEASE_TABLET | Freq: Every day | ORAL | Status: DC
  Administered 2023-01-04: 81 mg via ORAL
  Filled 2023-01-03: qty 1

## 2023-01-03 MED ORDER — IOHEXOL 350 MG/ML SOLN
INTRAVENOUS | Status: DC | PRN
  Administered 2023-01-03: 110 mL

## 2023-01-03 MED ORDER — ATORVASTATIN CALCIUM 40 MG PO TABS
40.0000 mg | ORAL_TABLET | Freq: Every day | ORAL | Status: DC
  Administered 2023-01-03: 40 mg via ORAL
  Filled 2023-01-03: qty 1

## 2023-01-03 MED ORDER — FAMOTIDINE IN NACL 20-0.9 MG/50ML-% IV SOLN
INTRAVENOUS | Status: AC | PRN
  Administered 2023-01-03: 20 mg via INTRAVENOUS

## 2023-01-03 MED ORDER — FAMOTIDINE IN NACL 20-0.9 MG/50ML-% IV SOLN
INTRAVENOUS | Status: AC
  Filled 2023-01-03: qty 50

## 2023-01-03 MED ORDER — HEPARIN SODIUM (PORCINE) 1000 UNIT/ML IJ SOLN
INTRAMUSCULAR | Status: AC
  Filled 2023-01-03: qty 10

## 2023-01-03 MED ORDER — HYDROXYUREA 500 MG PO CAPS: 500.0000 mg | ORAL_CAPSULE | Freq: Every day | ORAL | Status: DC

## 2023-01-03 MED ORDER — FLUTICASONE PROPIONATE 50 MCG/ACT NA SUSP: 1.0000 | Freq: Every day | NASAL | Status: DC | PRN

## 2023-01-03 MED ORDER — ASPIRIN 81 MG PO CHEW: 81.0000 mg | CHEWABLE_TABLET | ORAL | Status: DC

## 2023-01-03 MED ORDER — INSULIN GLARGINE-YFGN 100 UNIT/ML ~~LOC~~ SOLN
10.0000 [IU] | Freq: Every day | SUBCUTANEOUS | Status: DC
  Administered 2023-01-03: 10 [IU] via SUBCUTANEOUS
  Filled 2023-01-03 (×2): qty 0.1

## 2023-01-03 SURGICAL SUPPLY — 20 items
BALLN EMERGE MR 2.5X12 (BALLOONS) ×1
BALLN ~~LOC~~ EMERGE MR 5.0X8 (BALLOONS) ×1
BALLOON EMERGE MR 2.5X12 (BALLOONS) IMPLANT
BALLOON ~~LOC~~ EMERGE MR 5.0X8 (BALLOONS) IMPLANT
CATH INFINITI 5FR MULTPACK ANG (CATHETERS) IMPLANT
CATH LAUNCHER 6FR AL1 (CATHETERS) IMPLANT
CATHETER LAUNCHER 6FR AL1 (CATHETERS) ×1
DEVICE SPIDERFX EMB PROT 4MM (WIRE) IMPLANT
KIT ENCORE 26 ADVANTAGE (KITS) IMPLANT
KIT HEART LEFT (KITS) ×1 IMPLANT
PACK CARDIAC CATHETERIZATION (CUSTOM PROCEDURE TRAY) ×1 IMPLANT
SHEATH PINNACLE 5F 10CM (SHEATH) IMPLANT
SHEATH PINNACLE 6F 10CM (SHEATH) IMPLANT
SHEATH PROBE COVER 6X72 (BAG) IMPLANT
STENT MEGATRON 4.5X16 (Permanent Stent) IMPLANT
TRANSDUCER W/STOPCOCK (MISCELLANEOUS) ×1 IMPLANT
TUBING CIL FLEX 10 FLL-RA (TUBING) ×1 IMPLANT
WIRE ASAHI PROWATER 180CM (WIRE) IMPLANT
WIRE EMERALD 3MM-J .035X150CM (WIRE) IMPLANT
WIRE MICRO SET SILHO 5FR 7 (SHEATH) IMPLANT

## 2023-01-03 NOTE — Progress Notes (Signed)
Roosevelt, RN rounded on pt's femoral site, found to be bleeding. This RN started holding pressure at 1647, and Roosevelt took over holding at 1650. Page sent to Melina Copa, PA. Stopped holding pressure at 1654. Observed for another 5 minutes to ensure the bleeding did not resume. Dressing applied at 1700.

## 2023-01-03 NOTE — Plan of Care (Signed)

## 2023-01-03 NOTE — Progress Notes (Signed)
Prior-To-Admission Oral Chemotherapy for Treatment of Oncologic Disease   Order noted from Dr. Martinique to continue prior-to-admission oral chemotherapy regimen of hydroxyurea for Myelofibrosis/Myeloproliferative disorder. JAK2+.  Procedure Per Pharmacy & Therapeutics Committee Policy: Orders for continuation of home oral chemotherapy for treatment of an oncologic disease will be held unless approved by an oncologist during current admission.    For patients receiving oncology care at [facility], inpatient pharmacist contacts patient's oncologist during regular office hours to review. If earlier review is medically necessary, attending physician consults [facility] on-call oncologist   For patients receiving oncology care outside of [facility], attending physician consults patient's oncologist to review. If this oncologist or their coverage cannot be reached, attending physician consults [facility] on-call oncologist   Oral chemotherapy continuation order is on hold pending oncologist review, [facility] oncologist Dr. Burr Medico will be notified by inpatient pharmacy during office hours - call attempted 3/28 PM, Pharmacist to call again 3/29 AM   Arturo Morton, PharmD, BCPS Please check AMION for all [place] contact numbers Clinical Pharmacist 01/03/2023 5:24 PM

## 2023-01-03 NOTE — Progress Notes (Signed)
Site area: right femoral artery Site Prior to Removal:  Level 0 Pressure Applied For: 26 minutes Manual:   manual pressure applied  Patient Status During Pull:  stable, patient tolerated well Post Pull Site:  Level 0 Post Pull Instructions Given:  yes, patient instructions given Post Pull Pulses Present: right aka, right femoral pulse present post sheath pull Dressing Applied:  sterile gauze and tegaderm Bedrest begins @ 14:45 Comments:

## 2023-01-03 NOTE — Interval H&P Note (Signed)
History and Physical Interval Note:  01/03/2023 7:31 AM  Tyler Whitehead  has presented today for surgery, with the diagnosis of CAD.  The various methods of treatment have been discussed with the patient and family. After consideration of risks, benefits and other options for treatment, the patient has consented to  Procedure(s): LEFT HEART CATH AND CORS/GRAFTS ANGIOGRAPHY (N/A) as a surgical intervention.  The patient's history has been reviewed, patient examined, no change in status, stable for surgery.  I have reviewed the patient's chart and labs.  Questions were answered to the patient's satisfaction.   Cath Lab Visit (complete for each Cath Lab visit)  Clinical Evaluation Leading to the Procedure:   ACS: No.  Non-ACS:    Anginal Classification: CCS III  Anti-ischemic medical therapy: Maximal Therapy (2 or more classes of medications)  Non-Invasive Test Results: No non-invasive testing performed  Prior CABG: Previous CABG        Collier [facility] 01/03/2023 7:31 AM

## 2023-01-04 ENCOUNTER — Other Ambulatory Visit (HOSPITAL_COMMUNITY): Payer: Self-pay

## 2023-01-04 ENCOUNTER — Encounter: Payer: Self-pay | Admitting: Hematology

## 2023-01-04 ENCOUNTER — Encounter (HOSPITAL_COMMUNITY): Payer: Self-pay | Admitting: Cardiology

## 2023-01-04 DIAGNOSIS — I25708 Atherosclerosis of coronary artery bypass graft(s), unspecified, with other forms of angina pectoris: Secondary | ICD-10-CM | POA: Diagnosis not present

## 2023-01-04 DIAGNOSIS — I209 Angina pectoris, unspecified: Secondary | ICD-10-CM

## 2023-01-04 LAB — BASIC METABOLIC PANEL
Anion gap: 7 (ref 5–15)
BUN: 17 mg/dL (ref 8–23)
CO2: 27 mmol/L (ref 22–32)
Calcium: 8.7 mg/dL — ABNORMAL LOW (ref 8.9–10.3)
Chloride: 107 mmol/L (ref 98–111)
Creatinine, Ser: 1.16 mg/dL (ref 0.61–1.24)
GFR, Estimated: >60 mL/min (ref >60)
Glucose, Bld: 98 mg/dL (ref 70–99)
Potassium: 4.2 mmol/L (ref 3.5–5.1)
Sodium: 141 mmol/L (ref 135–145)

## 2023-01-04 LAB — LIPID PANEL
Cholesterol: 108 mg/dL (ref 0–200)
HDL: 30 mg/dL — ABNORMAL LOW (ref >40)
LDL Cholesterol: 56 mg/dL (ref 0–99)
Total CHOL/HDL Ratio: 3.6 RATIO
Triglycerides: 109 mg/dL (ref <150)
VLDL: 22 mg/dL (ref 0–40)

## 2023-01-04 LAB — CBC
HCT: 34.9 % — ABNORMAL LOW (ref 39.0–52.0)
Hemoglobin: 11.8 g/dL — ABNORMAL LOW (ref 13.0–17.0)
MCH: 36.4 pg — ABNORMAL HIGH (ref 26.0–34.0)
MCHC: 33.8 g/dL (ref 30.0–36.0)
MCV: 107.7 fL — ABNORMAL HIGH (ref 80.0–100.0)
Platelets: 483 10*3/uL — ABNORMAL HIGH (ref 150–400)
RBC: 3.24 MIL/uL — ABNORMAL LOW (ref 4.22–5.81)
RDW: 16.6 % — ABNORMAL HIGH (ref 11.5–15.5)
WBC: 13.4 10*3/uL — ABNORMAL HIGH (ref 4.0–10.5)
nRBC: 0.4 % — ABNORMAL HIGH (ref 0.0–0.2)

## 2023-01-04 LAB — HEMOGLOBIN A1C
Hgb A1c MFr Bld: 6.9 % — ABNORMAL HIGH (ref 4.8–5.6)
Mean Plasma Glucose: 151 mg/dL

## 2023-01-04 LAB — GLUCOSE, CAPILLARY: Glucose-Capillary: 73 mg/dL (ref 70–99)

## 2023-01-04 MED ORDER — CLOPIDOGREL BISULFATE 75 MG PO TABS
75.0000 mg | ORAL_TABLET | Freq: Every day | ORAL | 3 refills | Status: AC
  Filled 2023-01-04 – 2023-02-24 (×2): qty 30, 30d supply, fill #0

## 2023-01-04 MED FILL — Verapamil HCl IV Soln 2.5 MG/ML: INTRAVENOUS | Qty: 2 | Status: AC

## 2023-01-04 NOTE — Discharge Summary (Addendum)
Discharge Summary    Patient ID: Tyler Whitehead MRN: [id number]; DOB: [birth year]  Admit date: 01/03/2023 Discharge date: 01/04/2023  PCP:  Wenda Low, [place] Providers Cardiologist:  Minus Breeding, [place]   {    Discharge Diagnoses    Principal Problem:   Angina pectoris [facility])    Diagnostic Studies/Procedures    Left heart cath 01/03/23:  Fluoro time: 14.5 (min) DAP: 20.3 (Gycm2) Cumulative Air Kerma: 306.2 (mGy)   Diagnostic Dominance: Right  Intervention     Ost Cx to Dist Cx lesion is 90% stenosed.   Ost LM to Ost LAD lesion is 100% stenosed.   Ost RCA to Mid RCA lesion is 70% stenosed.   Mid RCA to Dist RCA lesion is 100% stenosed.   Origin to Prox Graft lesion is 100% stenosed.   Origin lesion is 80% stenosed.   Previously placed Mid Graft stent of unknown type is  widely patent.   A drug-eluting stent was successfully placed using a STENT MEGATRON 4.5X16.   Post intervention, there is a 0% residual stenosis.   Left radial artery graft was visualized by angiography and is small.   LIMA graft was visualized by angiography and is normal in caliber.   SVG graft was visualized by angiography and is large.   The graft exhibits no disease.   The left ventricular systolic function is normal.   LV end diastolic pressure is normal.   The left ventricular ejection fraction is 55-65% by visual estimate.   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  80% stenosis in proximal SVG. Prior stent  in mid SVG is patent Occluded free radial graft to the PDA Normal LV function Normal LVEDP Successful PCI of the SVG to OM3 using distal protection and DES x 1   Plan: will observe overnight tonight. Anticipate DC in am. DAPT for one year. The RCA is supplied by collaterals and is not suitable for CTO PCI - will treat medically.  _____________   History of Present Illness     Per admission H&P on  01/02/23:  Tyler Whitehead is a 82 y.o. male who presented for follow up of CAD/CABG.  Last seen in 2017.  He moved to [place] and was moving back.  He has CAD he has had CABG and PVD. His last cath [place] suggested that "Medical therapy should be enhanced if the patient has ongoing exertional chest discomfort despite maximal medical management and if he has evidence of inferior ischemia on a perfusion study then repeat PCI of the native right coronary artery could be considered. However, given the patient's track record the right coronary artery is not likely to remain open even if PCI could be accomplished."  He is also managed for lower extremity disease as seen by Dr. Trula Slade.  He has had a CVA of unclear etiology had an implantable loop by Dr. Lovena Le.  Cardiac catheterization on 05/11/2020 in [place] demonstrated 100% left main and mid RCA in-stent lesions.  LIMA to the LAD apparently was patent collaterals to the right coronary artery.  He had a patent vein graft to an OM 1 with a patent stent in the vein graft.  He had an occluded vein graft to the RCA.  He had 100% left SFA occlusion.  He was managed medically.  He did have retrograde cannulation of his left tibial peroneal trunk and had this angioplasty.  Carotid Dopplers  demonstrated 40 to 50% stenosis on the left.  Echo demonstrated an EF of 60%.  At the last visit he was having some chest discomfort consistent with a stable anginal pattern.  His beta blocker was titrated.    He had continued chest discomfort.  This was severe despite going up on his isosorbide.  He was not taking 180 mg although he has been taking 120 in the morning and 60 at night and could have tachyphylaxis.  He described chest pain similar to his previous angina.  It was a right upper sternal pain particular with exertion like recently carrying a very small piece of luggage.  He said the pain can get 10 out of 10.  He had to take couple nitroglycerin occasionally.  It was not improved  with medical management.  He had associated shortness of breath.  It was not having any new nausea vomiting or diaphoresis.  He gets around with a walker and his prosthesis.  He had a fungus on his left foot.   Hospital Course     Consultants: N/A    Unstable angina  CAD with hx of CABG - presented to elective left heart cath 01/02/23 for unstable angina, which showed left main and 3 vessel occlusive CAD, patent LIMA to LAD, patent SVG to large OM3, 80% stenosis in proximal SVG. Prior stent in mid SVG is patent. Occluded free radial graft to the PDA. Successful PCI of the SVG to OM3 using distal protection and DES x 1. The RCA is supplied by collaterals and is not suitable for CTO PCI. Normal LV function and LVEDP.  - medical therapy: start DAPT with ASA 81mg  and Plavix 75mg  daily for 1 year, will stop PTA cilostazol; continue Lipitor 40mg  daily; coreg 6.25mg  BID; imdur 120mg  daily; losartan 25mg  daily; and PRN nitro (new script sent to [place] today) - will check lipid panel and A1C today, result can be followed up at office  - post cath care discussed in detail at bedside - follow up arranged on 01/17/23 with cardiology   HTN - BP was high but now improved post cath, continue coreg, imdur, losartan as above  DM2 - will update A1C today - continue home regimen with lantus 10 unit QHS and Humalog AC  - follow up with PCP  HLD - will update lipid panel today - continue PTA lipitor 40mg  daily  PVD with hx of right leg amputation and multiple PCIs  - stop PTA cilostazol - continue ASA+Plavix and lipitor  - follow up with vascular surgery as scheduled   Primary Myeloproliferative neoplasm (Myelofibrosis) - follows hematology outpatient, last seen 11/05/22 and was stable, felt not good candidate for stem cell transplant due to his advanced age and multiple medical comorbidities. He is at high risk for thrombosis, he was advised taking ASA+Plavix at the time, was not able to tolerate  Jakafi due to anemia and fatigue - CBC with WBC 13400, Hgb 11.8, PLT 483k; overall near baseline, no significant evidence of acute leukemia, will continue PTA hydrea - follow up with hematology as scheduled       Did the patient have an acute coronary syndrome (MI, NSTEMI, STEMI, etc) this admission?:  No                               Did the patient have a percutaneous coronary intervention (stent / angioplasty)?:  Yes.     Cath/PCI Registry Performance &  Quality Measures: Aspirin prescribed? - Yes ADP Receptor Inhibitor (Plavix/Clopidogrel, Brilinta/Ticagrelor or Effient/Prasugrel) prescribed (includes medically managed patients)? - Yes High Intensity Statin (Lipitor 40-80mg  or Crestor 20-40mg ) prescribed? - Yes For EF <40%, was ACEI/ARB prescribed? - Yes For EF <40%, Aldosterone Antagonist (Spironolactone or Eplerenone) prescribed? - Not Applicable (EF >/= [id number]) Cardiac Rehab Phase II ordered? - Yes       _____________  Discharge Vitals Blood pressure (!) 100/49, pulse 85, temperature 98 F (36.7 C), temperature source Oral, resp. rate 17, height 5\' 7"  (1.702 m), weight 72.6 kg, SpO2 97 %.  Filed Weights   01/03/23 0550  Weight: 72.6 kg   Vitals:  Vitals:   01/04/23 0359 01/04/23 0730  BP: 127/63 (!) 100/49  Pulse: 89 85  Resp: 18 17  Temp: 98.2 F (36.8 C) 98 F (36.7 C)  SpO2: 98% 97%   General Appearance: In no apparent distress, laying in bed HEENT: Normocephalic, atraumatic.  Neck: Supple, trachea midline, no JVDs Cardiovascular: Regular rate and rhythm, normal S1-S2,  no murmur Respiratory: Resting breathing unlabored, lungs sounds clear to auscultation bilaterally, no use of accessory muscles. On room air.  No wheezes, rales or rhonchi.   Gastrointestinal: Bowel sounds positive, abdomen soft and non-tender Extremities: s/p remote right AKA, right groin with dressing in place, post cath site soft/non-tender/no hemorrhage or hematoma, right stump skin warm to  touch, no neurovascular deficit noted Musculoskeletal: Normal muscle bulk and tone Skin: Intact, warm, dry. No rashes or petechiae noted in exposed areas.  Neurologic: Alert, oriented to person, place and time. Fluent speech, no cognitive deficit, no gross focal neuro deficit Psychiatric: Normal affect. Mood is appropriate.     Labs & Radiologic Studies    CBC Recent Labs    01/03/23 1725 01/04/23 0206  WBC 12.1* 13.4*  HGB 12.4* 11.8*  HCT 37.3* 34.9*  MCV 107.5* 107.7*  PLT 470* 123XX123*   Basic Metabolic Panel Recent Labs    01/03/23 1725 01/04/23 0206  NA  --  141  K  --  4.2  CL  --  107  CO2  --  27  GLUCOSE  --  98  BUN  --  17  CREATININE 1.17 1.16  CALCIUM  --  8.7*   Liver Function Tests No results for input(s): "AST", "ALT", "ALKPHOS", "BILITOT", "PROT", "ALBUMIN" in the last 72 hours. No results for input(s): "LIPASE", "AMYLASE" in the last 72 hours. High Sensitivity Troponin:   No results for input(s): "TROPONINIHS" in the last 720 hours.  BNP Invalid input(s): "POCBNP" D-Dimer No results for input(s): "DDIMER" in the last 72 hours. Hemoglobin A1C No results for input(s): "HGBA1C" in the last 72 hours. Fasting Lipid Panel No results for input(s): "CHOL", "HDL", "LDLCALC", "TRIG", "CHOLHDL", "LDLDIRECT" in the last 72 hours. Thyroid Function Tests No results for input(s): "TSH", "T4TOTAL", "T3FREE", "THYROIDAB" in the last 72 hours.  Invalid input(s): "FREET3" _____________  CARDIAC CATHETERIZATION  Result Date: 01/03/2023   Colon Flattery Cx to Dist Cx lesion is 90% stenosed.   Ost LM to Ost LAD lesion is 100% stenosed.   Ost RCA to Mid RCA lesion is 70% stenosed.   Mid RCA to Dist RCA lesion is 100% stenosed.   Origin to Prox Graft lesion is 100% stenosed.   Origin lesion is 80% stenosed.   Previously placed Mid Graft stent of unknown type is  widely patent.   A drug-eluting stent was successfully placed using a STENT MEGATRON 4.5X16.   Post intervention, there  is a 0% residual stenosis.   Left radial artery graft was visualized by angiography and is small.   LIMA graft was visualized by angiography and is normal in caliber.   SVG graft was visualized by angiography and is large.   The graft exhibits no disease.   The left ventricular systolic function is normal.   LV end diastolic pressure is normal.   The left ventricular ejection fraction is 55-65% by visual estimate. 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  80% stenosis in proximal SVG. Prior stent  in mid SVG is patent Occluded free radial graft to the PDA Normal LV function Normal LVEDP Successful PCI of the SVG to OM3 using distal protection and DES x 1 Plan: will observe overnight tonight. Anticipate DC in am. DAPT for one year. The RCA is supplied by collaterals and is not suitable for CTO PCI - will treat medically.   Disposition   Patient is seen by Dr Percival Spanish today and deemed stable for discharge. Follow up plan, medication change, post cath care discussed in detail at bedside and patient is agreeable.  Pt is being discharged home today in good condition.  Follow-up Plans & Appointments     Follow-up Information     Lenna Sciara, NP Follow up on 01/17/2023.   Specialties: Cardiology, Family Medicine Why: at 8:25am for your cardiology follow up appointment Contact information: [address] [place] [address] [phone]                Discharge Instructions     AMB referral to Phase II Cardiac Rehabilitation   Complete by: As directed    Diagnosis: Coronary Stents   After initial evaluation and assessments completed: Virtual Based Care may be provided alone or in conjunction with Phase 2 Cardiac Rehab based on patient barriers.: Yes   Intensive Cardiac Rehabilitation (ICR) [place] location only OR Traditional Cardiac Rehabilitation (TCR) *If criteria for ICR are not met will enroll in TCR [facility] only): Yes   Diet - low  sodium heart healthy   Complete by: As directed    Discharge instructions   Complete by: As directed    PLEASE REMEMBER TO BRING ALL OF [place].  PLEASE ATTEND ALL SCHEDULED FOLLOW-UP APPOINTMENTS.   Activity: Increase activity slowly as tolerated. You may shower, but no soaking baths (or swimming) for 1 week. No driving for 24 hours. No lifting over 5 lbs for 1 week. No sexual activity for 1 week.   Wound Care: You may wash cath site gently with soap and water. Keep cath site clean and dry. If you notice pain, swelling, bleeding or pus at your cath site, please call [phone].  Groin Site Care  Refer to this sheet in the next few weeks. These instructions provide you with information on caring for yourself after your procedure. Your caregiver may also give you more specific instructions. Your treatment has been planned according to current medical practices, but problems sometimes occur. Call your caregiver if you have any problems or questions after your procedure.  HOME CARE INSTRUCTIONS You may shower 24 hours after the procedure. Remove the bandage (dressing) and gently wash the site with plain soap and water. Gently pat the site dry.  Do not apply powder or lotion to the site.  Do not sit in a bathtub, swimming pool, or whirlpool for 5 to 7 days.  No bending, squatting, or lifting anything over 10 pounds (4.5 kg) as directed by your caregiver.  Inspect the site at least twice daily.  Do not drive home if you are discharged the same day of the procedure. Have someone else drive you.  You may drive 24 hours after the procedure unless otherwise instructed by your caregiver.   What to expect: Any bruising will usually fade within 1 to 2 weeks.  Blood that collects in the tissue (hematoma) may be painful to the touch. It should usually decrease in size and tenderness within 1 to 2 weeks.   SEEK IMMEDIATE MEDICAL CARE IF: You have  unusual pain at the groin site or down the affected leg.  You have redness, warmth, swelling, or pain at the groin site.  You have drainage (other than a small amount of blood on the dressing).  You have chills.  You have a fever or persistent symptoms for more than 72 hours.  You have a fever and your symptoms suddenly get worse.  Your leg becomes pale, cool, tingly, or numb.  You have heavy bleeding from the site. Hold pressure on the site. Marland Kitchen    PLEASE DO NOT MISS ANY DOSES OF YOUR ASPIRIN/PLAVIX !!!!!   Also keep a log of you blood pressures and bring back to your follow up appt. Please call the office with any questions.   Patients taking blood thinners should generally stay away from medicines like ibuprofen, Advil, Motrin, naproxen, and Aleve due to risk of stomach bleeding. You may take Tylenol as directed or talk to your primary doctor about alternatives.  PLEASE ENSURE THAT YOU DO NOT RUN OUT OF YOUR ASPIRIN/PLAVIX. This medication is very important to remain on for at least one year. IF you have issues obtaining this medication due to cost please CALL the office 3-5 business days prior to running out in order to prevent missing doses of this medication.   Increase activity slowly   Complete by: As directed         Discharge Medications   Allergies as of 01/04/2023       Reactions   Other    Other reaction(s): Unknown        Medication List     STOP taking these medications    cilostazol 100 MG tablet Commonly known as: PLETAL       TAKE these medications    aspirin EC 81 MG tablet Take 81 mg by mouth daily.   atorvastatin 40 MG tablet Commonly known as: LIPITOR Take 40 mg by mouth at bedtime.   carvedilol 6.25 MG tablet Commonly known as: COREG TAKE 1 TABLET TWICE DAILY  WITH MEALS (DOSE INCREASE)   clopidogrel 75 MG tablet Commonly known as: PLAVIX Take 1 tablet (75 mg total) by mouth daily.   fluticasone 50 MCG/ACT nasal spray Commonly known  as: FLONASE Place 1 spray into both nostrils daily as needed for allergies.   hydroxyurea 500 MG capsule Commonly known as: HYDREA Take 2 capsules in morning.   insulin glargine 100 UNIT/ML injection Commonly known as: LANTUS Inject 10 Units into the skin at bedtime.   insulin lispro 100 UNIT/ML injection Commonly known as: HUMALOG Inject 10 Units into the skin 2 (two) times daily as needed for high blood sugar.   isosorbide mononitrate 60 MG 24 hr tablet Commonly known as: IMDUR TAKE 2 TABLETS DAILY   levocetirizine 5 MG tablet Commonly known as: XYZAL Take 5 mg by mouth every evening.  losartan 25 MG tablet Commonly known as: COZAAR Take 25 mg by mouth at bedtime.   nitroGLYCERIN 0.4 MG SL tablet Commonly known as: Nitrostat Place 1 tablet (0.4 mg total) under the tongue every 5 (five) minutes as needed for chest pain.   OneTouch Delica Plus 0000000 Misc Apply topically 2 (two) times daily.   OneTouch Verio test strip Generic drug: glucose blood 1 each 2 (two) times daily.   ProAir HFA 108 (90 Base) MCG/ACT inhaler Generic drug: albuterol Inhale 1 puff into the lungs every 4 (four) hours as needed for wheezing or shortness of breath.           Outstanding Labs/Studies     Duration of Discharge Encounter   Greater than 30 minutes including physician time.  Signed, Margie Billet, NP 01/04/2023, 9:17 AM  History and all data above reviewed.  Patient examined.  I agree with the findings as above.  The patient exam reveals COR:RRR  ,  Lungs: Clear  ,  Abd: Positive bowel sounds, no rebound no guarding, Ext Right groin without bleeding or bruising  .  All available labs, radiology testing, previous records reviewed. Agree with documented assessment and plan. Unstable angina:  OK to go home.   Stop Pletal.  Other meds as on MAR.  Has follow up with me on 4/4. We can push this back a couple of weeks.    Margie Billet  9:17 AM  01/04/2023

## 2023-01-04 NOTE — Plan of Care (Signed)

## 2023-01-04 NOTE — Progress Notes (Signed)
CARDIAC REHAB PHASE I   PRE:  Rate/Rhythm: 83 SR    BP: sitting 141/68    SpO2: 96 RA  MODE:  Ambulation: 400 ft   POST:  Rate/Rhythm: 109 ST    BP: sitting 146/61     SpO2:   Pt donned prosthesis independently (I handed him what he needed). Groin site looks good. Walked hall with his RW with standby assist. No angina.   Discussed with pt stent, Plavix importance, restrictions, diet, exercise as tolerated, NTG and CRPII. Pt receptive. He would like to do CRPII if he can find transportation. Will refer to Byron BS, ACSM-CEP 01/04/2023 9:26 AM

## 2023-01-05 LAB — LIPOPROTEIN A (LPA): Lipoprotein (a): 60.8 nmol/L — ABNORMAL HIGH

## 2023-01-07 ENCOUNTER — Ambulatory Visit: Payer: No Typology Code available for payment source | Admitting: Podiatry

## 2023-01-07 DIAGNOSIS — Z89611 Acquired absence of right leg above knee: Secondary | ICD-10-CM

## 2023-01-07 DIAGNOSIS — E1151 Type 2 diabetes mellitus with diabetic peripheral angiopathy without gangrene: Secondary | ICD-10-CM

## 2023-01-07 DIAGNOSIS — M79675 Pain in left toe(s): Secondary | ICD-10-CM

## 2023-01-07 DIAGNOSIS — B351 Tinea unguium: Secondary | ICD-10-CM

## 2023-01-07 DIAGNOSIS — M79674 Pain in right toe(s): Secondary | ICD-10-CM

## 2023-01-07 NOTE — Progress Notes (Signed)
This patient returns to my office for at risk foot care.  This patient requires this care by a professional since this patient will be at risk due to having AK amputation right leg, diabetic neuropathy and pvd. This patient is unable to cut nails himself since the patient cannot reach his nails left foot.  .These nails are painful walking and wearing shoes.  He says he has had multiple doctors evaluate and treat his painful left big toe. 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  Finally he has history of vascilar pathology in addition to right leg amputation. This patient presents for at risk foot care today.  General Appearance  Alert, conversant and in no acute stress.  Vascular  Dorsalis pedis and posterior tibial  pulses are absent  left foot..  Capillary return is within normal limits  left foot.  .Cold feet. Left foot.  Neurologic  Senn-Weinstein monofilament wire test within normal limits  left  Muscle power within normal limits left .  Nails Thick disfigured discolored nails with subungual debris  from hallux to fifth toes left foot.. No evidence of bacterial infection or drainage bilaterally.  Orthopedic  No limitations of motion  feet .  No crepitus or effusions noted.  No bony pathology or digital deformities noted.AK Amputation right leg.  Skin  normotropic skin with no porokeratosis noted bilaterally.  No signs of infections or ulcers noted.     Onychomycosis   Pain in left toes  Vascular pathology.  Consent was obtained for treatment procedures.   Mechanical debridement of nails 1-5  left performed with a nail nipper.  Filed with dremel without incident. He said he wanted permanent correction of his left big toenail but nobody would explain his vascular pathology to this patient. Told him to return for preventative foot care services.   Return office visit    3 months                  Told patient to return for periodic  foot care and evaluation due to potential at risk complications.   Gardiner Barefoot DPM

## 2023-01-10 ENCOUNTER — Ambulatory Visit: Payer: No Typology Code available for payment source | Admitting: Cardiology

## 2023-01-17 ENCOUNTER — Encounter: Payer: Self-pay | Admitting: Nurse Practitioner

## 2023-01-17 ENCOUNTER — Ambulatory Visit: Payer: No Typology Code available for payment source | Attending: Nurse Practitioner | Admitting: Nurse Practitioner

## 2023-01-17 VITALS — BP 122/56 | HR 84 | Ht 67.0 in | Wt 160.2 lb

## 2023-01-17 DIAGNOSIS — E118 Type 2 diabetes mellitus with unspecified complications: Secondary | ICD-10-CM

## 2023-01-17 DIAGNOSIS — I2510 Atherosclerotic heart disease of native coronary artery without angina pectoris: Secondary | ICD-10-CM | POA: Diagnosis not present

## 2023-01-17 DIAGNOSIS — I10 Essential (primary) hypertension: Secondary | ICD-10-CM

## 2023-01-17 DIAGNOSIS — Z8673 Personal history of transient ischemic attack (TIA), and cerebral infarction without residual deficits: Secondary | ICD-10-CM

## 2023-01-17 DIAGNOSIS — I739 Peripheral vascular disease, unspecified: Secondary | ICD-10-CM | POA: Diagnosis not present

## 2023-01-17 DIAGNOSIS — I25118 Atherosclerotic heart disease of native coronary artery with other forms of angina pectoris: Secondary | ICD-10-CM | POA: Diagnosis not present

## 2023-01-17 DIAGNOSIS — E785 Hyperlipidemia, unspecified: Secondary | ICD-10-CM

## 2023-01-17 NOTE — Progress Notes (Signed)
Office Visit    Patient Name: Tyler Whitehead Date of Encounter: 01/17/2023  Primary Care Provider:  Georgann Housekeeper, MD Primary Cardiologist:  Rollene Rotunda, MD  Chief Complaint    82 year old male with a history of CAD s/p prior stenting to the RCA CABG x 3 (LIMA-LAD, left radial-PDA, SVG-OM) in 2000, DES-SVG-OM in 2014, DES-SVG-OM in 2023  carotid artery stenosis, hypertension, hyperlipidemia, PVD s/p right femoropopliteal bypass in 2012, cryptogenic stroke s/p ILR, and type 2 diabetes who presents for follow-up related to CAD s/p DES-SVG-OM.  Past Medical History    Past Medical History:  Diagnosis Date   CAD (coronary artery disease)    a. S/p 3V CABG 2000 with LIMA-LAD, left radial-PDA, SVG-OM1. b. 2014: stent to SVG-OM1; then found to have obstruction distal to LAD anastamosis of LIMA s/p DES.    CHF (congestive heart failure)    Diverticular disease    DM (diabetes mellitus)    Hemorrhoid    HTN (hypertension)    Hyperlipidemia    Peripheral vascular disease    a. R femoropopliteal bypass ~2012 in [place], reportedly shown to be occluded. Now followed by Dr. Allyson Sabal.   Primary myelofibrosis    RBBB    Past Surgical History:  Procedure Laterality Date   ABDOMINAL ANGIOGRAM N/A 01/18/2015   Procedure: ABDOMINAL ANGIOGRAM;  Surgeon: Nada Libman, MD;  Location: [facility] CATH LAB;  Service: Cardiovascular;  Laterality: N/A;   ABDOMINAL AORTAGRAM N/A 09/21/2014   Procedure: ABDOMINAL Ronny Flurry;  Surgeon: Sherren Kerns, MD;  Location: [facility] CATH LAB;  Service: Cardiovascular;  Laterality: N/A;   ABDOMINAL AORTOGRAM W/LOWER EXTREMITY N/A 03/28/2021   Procedure: ABDOMINAL AORTOGRAM W/LOWER EXTREMITY;  Surgeon: Nada Libman, MD;  Location: [facility] INVASIVE CV LAB;  Service: Cardiovascular;  Laterality: N/A;   CARDIAC CATHETERIZATION  May 2014   CORONARY ARTERY BYPASS GRAFT  2000   LM occluded, LIMA to LAD patent but distal native LAD disease, saphenous vein bypass graft to OM with 95%  stenosis in the midportion, right coronary artery occluded, radial artery graft to right coronary artery patent but was diffusely small. Marginal graft stented. 7 2014. Of note he had multiple previous stents in the right coronary artery. The distal LAD was treated with angioplasty. I   CORONARY STENT INTERVENTION N/A 01/03/2023   Procedure: CORONARY STENT INTERVENTION;  Surgeon: Swaziland, Peter M, MD;  Location: [facility] INVASIVE CV LAB;  Service: Cardiovascular;  Laterality: N/A;   LEFT HEART CATH AND CORS/GRAFTS ANGIOGRAPHY N/A 01/03/2023   Procedure: LEFT HEART CATH AND CORS/GRAFTS ANGIOGRAPHY;  Surgeon: Swaziland, Peter M, MD;  Location: [facility] INVASIVE CV LAB;  Service: Cardiovascular;  Laterality: N/A;   LOOP RECORDER IMPLANT N/A 07/12/2014   Procedure: LOOP RECORDER IMPLANT;  Surgeon: Marinus Maw, MD;  Location: [facility] CATH LAB;  Service: Cardiovascular;  Laterality: N/A;   LOWER EXTREMITY ANGIOGRAM  09/21/2014   Procedure: LOWER EXTREMITY ANGIOGRAM;  Surgeon: Sherren Kerns, MD;  Location: [facility] CATH LAB;  Service: Cardiovascular;;   PERCUTANEOUS CORONARY STENT INTERVENTION (PCI-S)  April 27 2013   right leg amputation  2017   TEE WITHOUT CARDIOVERSION N/A 07/12/2014   Procedure: TRANSESOPHAGEAL ECHOCARDIOGRAM (TEE);  Surgeon: Pricilla Riffle, MD;  Location: [facility] ENDOSCOPY;  Service: Cardiovascular;  Laterality: N/A;   VEIN SURGERY      Allergies  Allergies  Allergen Reactions   Other     Other reaction(s): Unknown     Labs/Other Studies Reviewed    The following studies were  reviewed today: [facility] 01/03/2023:    Ost Cx to Dist Cx lesion is 90% stenosed.   Ost LM to Ost LAD lesion is 100% stenosed.   Ost RCA to Mid RCA lesion is 70% stenosed.   Mid RCA to Dist RCA lesion is 100% stenosed.   Origin to Prox Graft lesion is 100% stenosed.   Origin lesion is 80% stenosed.   Previously placed Mid Graft stent of unknown type is  widely patent.   A drug-eluting stent was successfully placed using a STENT  MEGATRON 4.5X16.   Post intervention, there is a 0% residual stenosis.   Left radial artery graft was visualized by angiography and is small.   LIMA graft was visualized by angiography and is normal in caliber.   SVG graft was visualized by angiography and is large.   The graft exhibits no disease.   The left ventricular systolic function is normal.   LV end diastolic pressure is normal.   The left ventricular ejection fraction is 55-65% by visual estimate.   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  80% stenosis in proximal SVG. Prior stent  in mid SVG is patent Occluded free radial graft to the PDA Normal LV function Normal LVEDP Successful PCI of the SVG to OM3 using distal protection and DES x 1   Plan: will observe overnight tonight. Anticipate DC in am. DAPT for one year. The RCA is supplied by collaterals and is not suitable for CTO PCI - will treat medically.     Recent Labs: 11/05/2022: ALT 12 01/04/2023: BUN 17; Creatinine, Ser 1.16; Hemoglobin 11.8; Platelets 483; Potassium 4.2; Sodium 141  Recent Lipid Panel    Component Value Date/Time   CHOL 108 01/04/2023 0251   TRIG 109 01/04/2023 0251   HDL 30 (L) 01/04/2023 0251   CHOLHDL 3.6 01/04/2023 0251   VLDL 22 01/04/2023 0251   LDLCALC 56 01/04/2023 0251    History of Present Illness    82 year old male with the above past medical history including CAD s/p prior stenting to the RCA CABG x 3 (LIMA-LAD, left radial-PDA, SVG-OM) in 2000, DES-SVG-OM in 2014, DES-SVG-OM in 2023, carotid artery stenosis, hypertension, hyperlipidemia, PVD s/p right femoropopliteal bypass in 2012, cryptogenic stroke s/p ILR, and type 2 diabetes.  Cardiac catheterization 05/2020 regularly demonstrated 100% left main and mid RCA in-stent lesions patent LIMA-LAD with collaterals to the RCA, patent SVG-OM with patent stent in the vein graft, occluded SVG-RCA, imaged medically.  Echocardiogram at the time of  EF 60%.  He has a history of PAD follows with vascular surgery.  History of prior stroke s/p ILR which revealed no evidence of atrial fibrillation.  Carotid Dopplers in [age] year old 40 to 50% LICA stenosis.  He was last seen in the office on 12/28/2022 and noted intermittent chest discomfort despite increased isosorbide, was reported to prior anginal.  He underwent cardiac catheterization which revealed patent LIMA-LAD, patent SVG-to OM 3 with 80% stenosis in the proximal SVG s/p DES, patent prior stent to mid SVG-OM, and occluded free radial graft to PDA, RCA, not suitable CTO, medical management was recommended.   He presents today for follow-up.  Since his procedure he has been stable from a cardiac standpoint.  He notes significant improvement in his dyspnea on exertion.  He has mild intermittent chest discomfort, overall improved.  He has left lower extremity edema, unchanged from prior visits.  He denies any PND, orthopnea, weight  gain.  Overall, he reports feeling well.    Home Medications    Current Outpatient Medications  Medication Sig Dispense Refill   aspirin 81 MG EC tablet Take 81 mg by mouth daily.     atorvastatin (LIPITOR) 40 MG tablet Take 40 mg by mouth at bedtime.     carvedilol (COREG) 6.25 MG tablet TAKE 1 TABLET TWICE DAILY  WITH MEALS (DOSE INCREASE) 180 tablet 3   clopidogrel (PLAVIX) 75 MG tablet Take 1 tablet (75 mg total) by mouth daily. 30 tablet 3   fluticasone (FLONASE) 50 MCG/ACT nasal spray Place 1 spray into both nostrils daily as needed for allergies.     hydroxyurea (HYDREA) 500 MG capsule Take 2 capsules in morning. 90 capsule 5   insulin glargine (LANTUS) 100 UNIT/ML injection Inject 10 Units into the skin at bedtime.     insulin lispro (HUMALOG) 100 UNIT/ML injection Inject 10 Units into the skin 2 (two) times daily as needed for high blood sugar.     isosorbide mononitrate (IMDUR) 60 MG 24 hr tablet TAKE 2 TABLETS DAILY 180 tablet 2   Lancets (ONETOUCH DELICA  PLUS LANCET33G) MISC Apply topically 2 (two) times daily.     levocetirizine (XYZAL) 5 MG tablet Take 5 mg by mouth every evening.     losartan (COZAAR) 25 MG tablet Take 25 mg by mouth at bedtime.     nitroGLYCERIN (NITROSTAT) 0.4 MG SL tablet Place 1 tablet (0.4 mg total) under the tongue every 5 (five) minutes as needed for chest pain. 25 tablet 3   ONETOUCH VERIO test strip 1 each 2 (two) times daily.     PROAIR HFA 108 (90 BASE) MCG/ACT inhaler Inhale 1 puff into the lungs every 4 (four) hours as needed for wheezing or shortness of breath.      No current facility-administered medications for this visit.     Review of Systems   He denies chest pain, palpitations, pnd, orthopnea, n, v, dizziness, syncope, edema, weight gain, or early satiety. All other systems reviewed and are otherwise negative except as noted above.    Cardiac Rehabilitation Eligibility Assessment  The patient has declined or is not appropriate for cardiac rehabilitation.   Physical Exam    VS:  BP (!) 122/56 (BP Location: Left Arm, Patient Position: Sitting, Cuff Size: Normal)   Pulse 84   Ht 5\' 7"  (1.702 m)   Wt 160 lb 3.2 oz (72.7 kg)   SpO2 98%   BMI 25.09 kg/m   GEN: Well nourished, well developed, in no acute distress. HEENT: normal. Neck: Supple, no JVD, carotid bruits, or masses. Cardiac: RRR, no murmurs, rubs, or gallops. No clubbing, cyanosis, edema.  Radials 2+ and equal bilaterally. R femoral cath site without bruising, bleeding or hematoma.  Respiratory:  Respirations regular and unlabored, clear to auscultation bilaterally. GI: Soft, nontender, nondistended, BS + x 4. MS: R AKA, otherwise, no deformity or atrophy. Skin: warm and dry, no rash. Neuro:  Strength and sensation are intact. Psych: Normal affect.  Accessory Clinical Findings    ECG personally reviewed by me today -NSR, 84 bpm, sinus arrhythmia, RBBB- no acute changes.   Lab Results  Component Value Date   WBC 13.4 (H)  01/04/2023   HGB 11.8 (L) 01/04/2023   HCT 34.9 (L) 01/04/2023   MCV 107.7 (H) 01/04/2023   PLT 483 (H) 01/04/2023   Lab Results  Component Value Date   CREATININE 1.16 01/04/2023   BUN 17 01/04/2023  NA 141 01/04/2023   K 4.2 01/04/2023   CL 107 01/04/2023   CO2 27 01/04/2023   Lab Results  Component Value Date   ALT 12 11/05/2022   AST 13 (L) 11/05/2022   ALKPHOS 80 11/05/2022   BILITOT 0.8 11/05/2022   Lab Results  Component Value Date   CHOL 108 01/04/2023   HDL 30 (L) 01/04/2023   LDLCALC 56 01/04/2023   TRIG 109 01/04/2023   CHOLHDL 3.6 01/04/2023    Lab Results  Component Value Date   HGBA1C 6.9 (H) 01/04/2023    Assessment & Plan   1. CAD: S/p prior stenting to the RCA CABG x 3 (LIMA-LAD, left radial-PDA, SVG-OM) in 2000, DES-SVG-OM in 2014, DES-SVG-OM in 12/2021.  He has occluded free radial graft to PDA, RCA, not suitable to CTO.  He has stable mild intermittent chest discomfort, his dyspnea on exertion has significantly improved. If he notes progressive symptoms, could consider possible addition of Ranexa versus low-dose amlodipine. Continue aspirin, Plavix, carvedilol, losartan, Imdur, Lipitor.  2. Hypertension: BP well controlled. Continue current antihypertensive regimen.   3. Hyperlipidemia: LDL was 56 in 12/2022.  Continue Lipitor.  4. Carotid artery stenosis/PAD: Follows with vascular surgery.   5. Type 2 diabetes: A1c was 6.9 in 12/2022.  Monitored and managed per PCP.  6. History of CVA: No recurrence. Continue Aspirin, Plavix, Lipitor.   7. Disposition: Follow-up in 3 to 4 months with Dr. Antoine Poche.      Joylene Grapes, NP 01/17/2023, 12:04 PM

## 2023-01-17 NOTE — Patient Instructions (Signed)
Medication Instructions:  No Changes *If you need a refill on your cardiac medications before your next appointment, please call your pharmacy*   Lab Work: None ordered   Testing/Procedures: None ordered   Follow-Up: At [facility], you and your health needs are our priority.  As part of our continuing mission to provide you with exceptional heart care, we have created designated Provider Care Teams.  These Care Teams include your primary Cardiologist (physician) and Advanced Practice Providers (APPs -  Physician Assistants and Nurse Practitioners) who all work together to provide you with the care you need, when you need it.  We recommend signing up for the patient portal called "MyChart".  Sign up information is provided on this After Visit Summary.  MyChart is used to connect with patients for Virtual Visits (Telemedicine).  Patients are able to view lab/test results, encounter notes, upcoming appointments, etc.  Non-urgent messages can be sent to your provider as well.   To learn more about what you can do with MyChart, go to [email or web address].    Your next appointment:   3-4 month(s)  Provider:   Rollene Rotunda, MD

## 2023-01-25 ENCOUNTER — Inpatient Hospital Stay: Payer: No Typology Code available for payment source | Attending: Hematology

## 2023-01-25 DIAGNOSIS — H52223 Regular astigmatism, bilateral: Secondary | ICD-10-CM | POA: Diagnosis not present

## 2023-01-25 DIAGNOSIS — D7581 Myelofibrosis: Secondary | ICD-10-CM | POA: Diagnosis not present

## 2023-01-25 DIAGNOSIS — D473 Essential (hemorrhagic) thrombocythemia: Secondary | ICD-10-CM | POA: Diagnosis not present

## 2023-01-25 LAB — CBC WITH DIFFERENTIAL (CANCER CENTER ONLY)
Abs Immature Granulocytes: 0.17 10*3/uL — ABNORMAL HIGH (ref 0.00–0.07)
Basophils Absolute: 0.2 10*3/uL — ABNORMAL HIGH (ref 0.0–0.1)
Basophils Relative: 1 %
Eosinophils Absolute: 0.3 10*3/uL (ref 0.0–0.5)
Eosinophils Relative: 2 %
HCT: 34.3 % — ABNORMAL LOW (ref 39.0–52.0)
Hemoglobin: 11.3 g/dL — ABNORMAL LOW (ref 13.0–17.0)
Immature Granulocytes: 1 %
Lymphocytes Relative: 6 %
Lymphs Abs: 0.9 10*3/uL (ref 0.7–4.0)
MCH: 36.5 pg — ABNORMAL HIGH (ref 26.0–34.0)
MCHC: 32.9 g/dL (ref 30.0–36.0)
MCV: 110.6 fL — ABNORMAL HIGH (ref 80.0–100.0)
Monocytes Absolute: 0.6 10*3/uL (ref 0.1–1.0)
Monocytes Relative: 4 %
Neutro Abs: 11.5 10*3/uL — ABNORMAL HIGH (ref 1.7–7.7)
Neutrophils Relative %: 86 %
Platelet Count: 477 10*3/uL — ABNORMAL HIGH (ref 150–400)
RBC: 3.1 MIL/uL — ABNORMAL LOW (ref 4.22–5.81)
RDW: 17.5 % — ABNORMAL HIGH (ref 11.5–15.5)
WBC Count: 13.6 10*3/uL — ABNORMAL HIGH (ref 4.0–10.5)
nRBC: 0.2 % (ref 0.0–0.2)

## 2023-01-25 LAB — CMP (CANCER CENTER ONLY)
ALT: 8 U/L (ref 0–44)
AST: 12 U/L — ABNORMAL LOW (ref 15–41)
Albumin: 3.9 g/dL (ref 3.5–5.0)
Alkaline Phosphatase: 87 U/L (ref 38–126)
Anion gap: 1 — ABNORMAL LOW (ref 5–15)
BUN: 13 mg/dL (ref 8–23)
CO2: 31 mmol/L (ref 22–32)
Calcium: 9.2 mg/dL (ref 8.9–10.3)
Chloride: 108 mmol/L (ref 98–111)
Creatinine: 1.08 mg/dL (ref 0.61–1.24)
GFR, Estimated: >60 mL/min (ref >60)
Glucose, Bld: 201 mg/dL — ABNORMAL HIGH (ref 70–99)
Potassium: 4.9 mmol/L (ref 3.5–5.1)
Sodium: 140 mmol/L (ref 135–145)
Total Bilirubin: 0.5 mg/dL (ref 0.3–1.2)
Total Protein: 6.2 g/dL — ABNORMAL LOW (ref 6.5–8.1)

## 2023-02-04 ENCOUNTER — Inpatient Hospital Stay: Payer: No Typology Code available for payment source

## 2023-02-07 ENCOUNTER — Ambulatory Visit (INDEPENDENT_AMBULATORY_CARE_PROVIDER_SITE_OTHER): Payer: No Typology Code available for payment source | Admitting: Podiatry

## 2023-02-07 ENCOUNTER — Encounter: Payer: Self-pay | Admitting: Podiatry

## 2023-02-07 ENCOUNTER — Ambulatory Visit (INDEPENDENT_AMBULATORY_CARE_PROVIDER_SITE_OTHER): Payer: No Typology Code available for payment source

## 2023-02-07 DIAGNOSIS — M7752 Other enthesopathy of left foot: Secondary | ICD-10-CM

## 2023-02-07 DIAGNOSIS — I739 Peripheral vascular disease, unspecified: Secondary | ICD-10-CM

## 2023-02-07 NOTE — Progress Notes (Signed)
Subjective:   Patient ID: Tyler Whitehead, male   DOB: 82 y.o.   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 leg and has frustration about the discomfort stating Neosporin Epsom salts help   ROS      Objective:  Physical Exam  Vascular status indicates no palpable pulses left but toes are warm with good cap fill time and does have trauma to the left hallux nail bed but no active drainage noted with mild to moderate inflammation     Assessment:  Strong probability that this is vascular but he knows that it has been to numerous doctors concerning that condition     Plan:  H&P reviewed I did apply cushion to the toe advised on shoes with wide toe box do not recommend other treatments currently no indication currently of infection but if any changes were to occur patient is to let us know right away may require antibiotics or other treatment for possible careful nail removal but we will have to be very careful with this due to the circulatory issues

## 2023-02-08 ENCOUNTER — Other Ambulatory Visit: Payer: Self-pay | Admitting: Podiatry

## 2023-02-08 DIAGNOSIS — I739 Peripheral vascular disease, unspecified: Secondary | ICD-10-CM

## 2023-02-08 DIAGNOSIS — M7752 Other enthesopathy of left foot: Secondary | ICD-10-CM

## 2023-02-26 ENCOUNTER — Other Ambulatory Visit: Payer: Self-pay

## 2023-02-28 ENCOUNTER — Ambulatory Visit (INDEPENDENT_AMBULATORY_CARE_PROVIDER_SITE_OTHER): Payer: No Typology Code available for payment source

## 2023-02-28 DIAGNOSIS — I739 Peripheral vascular disease, unspecified: Secondary | ICD-10-CM

## 2023-02-28 DIAGNOSIS — Z89611 Acquired absence of right leg above knee: Secondary | ICD-10-CM

## 2023-02-28 DIAGNOSIS — E1142 Type 2 diabetes mellitus with diabetic polyneuropathy: Secondary | ICD-10-CM

## 2023-02-28 NOTE — Progress Notes (Signed)
Patient presents to the office today for diabetic shoe and insole measuring.  ABN signed.   Documentation of medical necessity will be sent to patient's treating diabetic doctor to verify and sign.   Patient's diabetic provider: Georgann Housekeeper, MD   Shoes and insoles will be ordered at that time and patient will be notified for an appointment for fitting when they arrive.   Patient shoe selection-   1st   Shoe choice:   [phone]  2nd  Shoe choice:   [phone]  Shoe size ordered: 11.5

## 2023-03-01 ENCOUNTER — Other Ambulatory Visit: Payer: Self-pay

## 2023-03-01 ENCOUNTER — Other Ambulatory Visit (HOSPITAL_COMMUNITY): Payer: Self-pay

## 2023-03-05 DIAGNOSIS — Z89611 Acquired absence of right leg above knee: Secondary | ICD-10-CM | POA: Diagnosis not present

## 2023-03-20 DIAGNOSIS — I69351 Hemiplegia and hemiparesis following cerebral infarction affecting right dominant side: Secondary | ICD-10-CM | POA: Diagnosis not present

## 2023-03-20 DIAGNOSIS — D471 Chronic myeloproliferative disease: Secondary | ICD-10-CM | POA: Diagnosis not present

## 2023-03-20 DIAGNOSIS — N182 Chronic kidney disease, stage 2 (mild): Secondary | ICD-10-CM | POA: Diagnosis not present

## 2023-03-20 DIAGNOSIS — E1151 Type 2 diabetes mellitus with diabetic peripheral angiopathy without gangrene: Secondary | ICD-10-CM | POA: Diagnosis not present

## 2023-03-20 DIAGNOSIS — R269 Unspecified abnormalities of gait and mobility: Secondary | ICD-10-CM | POA: Diagnosis not present

## 2023-03-20 DIAGNOSIS — Z89619 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: Secondary | ICD-10-CM | POA: Diagnosis not present

## 2023-03-20 DIAGNOSIS — Z951 Presence of aortocoronary bypass graft: Secondary | ICD-10-CM | POA: Diagnosis not present

## 2023-03-20 DIAGNOSIS — Z8673 Personal history of transient ischemic attack (TIA), and cerebral infarction without residual deficits: Secondary | ICD-10-CM | POA: Diagnosis not present

## 2023-03-20 DIAGNOSIS — E1140 Type 2 diabetes mellitus with diabetic neuropathy, unspecified: Secondary | ICD-10-CM | POA: Diagnosis not present

## 2023-03-20 DIAGNOSIS — E785 Hyperlipidemia, unspecified: Secondary | ICD-10-CM | POA: Diagnosis not present

## 2023-03-20 DIAGNOSIS — I25709 Atherosclerosis of coronary artery bypass graft(s), unspecified, with unspecified angina pectoris: Secondary | ICD-10-CM | POA: Diagnosis not present

## 2023-03-20 DIAGNOSIS — I10 Essential (primary) hypertension: Secondary | ICD-10-CM | POA: Diagnosis not present

## 2023-03-25 ENCOUNTER — Other Ambulatory Visit: Payer: Self-pay | Admitting: Podiatry

## 2023-04-08 ENCOUNTER — Ambulatory Visit: Payer: No Typology Code available for payment source | Admitting: Podiatry

## 2023-04-08 ENCOUNTER — Encounter: Payer: Self-pay | Admitting: Podiatry

## 2023-04-08 DIAGNOSIS — E1151 Type 2 diabetes mellitus with diabetic peripheral angiopathy without gangrene: Secondary | ICD-10-CM

## 2023-04-08 NOTE — Progress Notes (Signed)
Patient returned to pick up his shoes.  The doctor never signed his paperwork so his shoes were never ordered.  Patient was given the paperwork for the doctor to sign.  Helane Gunther DPM

## 2023-05-05 NOTE — Assessment & Plan Note (Signed)
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 Previously discussed this is not curable but treatable, risk of developing acute leukemia or aplastic anemia later on. -he is not a good candidate for stem cell transplant due to his advanced age and multiple medical comorbidities. -he tried [place] for 4 months in 2014, could not tolerate due to his anemia and fatigue. -He is aware of the importance of continued Hydrea and normalized his blood counts to prevent further stroke or thrombosis.  -he continues hydrea he is taking 1500mg  daily nowl, blood counts stable, will continue at same dose

## 2023-05-06 ENCOUNTER — Inpatient Hospital Stay: Payer: No Typology Code available for payment source | Attending: Hematology

## 2023-05-06 ENCOUNTER — Other Ambulatory Visit: Payer: Self-pay

## 2023-05-06 ENCOUNTER — Inpatient Hospital Stay (HOSPITAL_BASED_OUTPATIENT_CLINIC_OR_DEPARTMENT_OTHER): Payer: No Typology Code available for payment source | Admitting: Hematology

## 2023-05-06 ENCOUNTER — Encounter: Payer: Self-pay | Admitting: Hematology

## 2023-05-06 VITALS — BP 118/64 | HR 78 | Temp 97.5°F | Resp 18 | Ht 67.0 in | Wt 158.6 lb

## 2023-05-06 DIAGNOSIS — D7581 Myelofibrosis: Secondary | ICD-10-CM | POA: Diagnosis not present

## 2023-05-06 DIAGNOSIS — I25118 Atherosclerotic heart disease of native coronary artery with other forms of angina pectoris: Secondary | ICD-10-CM

## 2023-05-06 DIAGNOSIS — D471 Chronic myeloproliferative disease: Secondary | ICD-10-CM | POA: Diagnosis not present

## 2023-05-06 LAB — CBC WITH DIFFERENTIAL (CANCER CENTER ONLY)
Abs Immature Granulocytes: 0.15 10*3/uL — ABNORMAL HIGH (ref 0.00–0.07)
Basophils Absolute: 0.2 10*3/uL — ABNORMAL HIGH (ref 0.0–0.1)
Basophils Relative: 1 %
Eosinophils Absolute: 0.3 10*3/uL (ref 0.0–0.5)
Eosinophils Relative: 2 %
HCT: 35 % — ABNORMAL LOW (ref 39.0–52.0)
Hemoglobin: 11.3 g/dL — ABNORMAL LOW (ref 13.0–17.0)
Immature Granulocytes: 1 %
Lymphocytes Relative: 5 %
Lymphs Abs: 0.9 10*3/uL (ref 0.7–4.0)
MCH: 31.8 pg (ref 26.0–34.0)
MCHC: 32.3 g/dL (ref 30.0–36.0)
MCV: 98.6 fL (ref 80.0–100.0)
Monocytes Absolute: 0.6 10*3/uL (ref 0.1–1.0)
Monocytes Relative: 4 %
Neutro Abs: 14.9 10*3/uL — ABNORMAL HIGH (ref 1.7–7.7)
Neutrophils Relative %: 87 %
Platelet Count: 441 10*3/uL — ABNORMAL HIGH (ref 150–400)
RBC: 3.55 MIL/uL — ABNORMAL LOW (ref 4.22–5.81)
RDW: 20.1 % — ABNORMAL HIGH (ref 11.5–15.5)
WBC Count: 17 10*3/uL — ABNORMAL HIGH (ref 4.0–10.5)
nRBC: 0 % (ref 0.0–0.2)

## 2023-05-06 LAB — CMP (CANCER CENTER ONLY)
ALT: 8 U/L (ref 0–44)
AST: 12 U/L — ABNORMAL LOW (ref 15–41)
Albumin: 3.6 g/dL (ref 3.5–5.0)
Alkaline Phosphatase: 99 U/L (ref 38–126)
Anion gap: 5 (ref 5–15)
BUN: 21 mg/dL (ref 8–23)
CO2: 28 mmol/L (ref 22–32)
Calcium: 8.8 mg/dL — ABNORMAL LOW (ref 8.9–10.3)
Chloride: 106 mmol/L (ref 98–111)
Creatinine: 1.08 mg/dL (ref 0.61–1.24)
GFR, Estimated: >60 mL/min (ref >60)
Glucose, Bld: 177 mg/dL — ABNORMAL HIGH (ref 70–99)
Potassium: 4.4 mmol/L (ref 3.5–5.1)
Sodium: 139 mmol/L (ref 135–145)
Total Bilirubin: 0.4 mg/dL (ref 0.3–1.2)
Total Protein: 5.6 g/dL — ABNORMAL LOW (ref 6.5–8.1)

## 2023-05-06 MED ORDER — HYDROXYUREA 500 MG PO CAPS: ORAL_CAPSULE | ORAL | 2 refills | Status: DC

## 2023-05-06 NOTE — Progress Notes (Signed)
[facility]   Telephone:([phone]) [phone] [phone]   Clinic Follow up Note   Patient Care Team: Default, Provider, MD as PCP - General Rollene Rotunda, MD as PCP - Cardiology (Cardiology) Rennis Golden Lisette Abu, MD as Consulting Physician (Cardiology) Rollene Rotunda, MD as Consulting Physician (Cardiology) Van Clines, MD as Consulting Physician (Neurology) Malachy Mood, MD as Attending Physician (Hematology and Oncology)  Date of Service:  05/06/2023  CHIEF COMPLAINT: f/u of myelofibrosis     CURRENT THERAPY: Hydrea 1000mg  daily     ASSESSMENT:  Tyler Whitehead is a 82 y.o. 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 Previously discussed this is not curable but treatable, risk of developing acute leukemia or aplastic anemia later on. -he is not a good candidate for stem cell transplant due to his advanced age and multiple medical comorbidities. -he tried [place] for 4 months in 2014, could not tolerate due to his anemia and fatigue. -He is aware of the importance of continued Hydrea and normalized his blood counts to prevent further stroke or thrombosis.  -he continues hydrea he is taking 1500mg  daily nowl, blood counts stable, will continue at same dose     PLAN: -lab reviewed, will continue Hydrea 1500 milligram daily -lab in 3 months  -Lab and follow-up in 6 months.     SUMMARY OF ONCOLOGIC HISTORY: Oncology History  Myelofibrosis (HCC)  12/26/2012 Bone Marrow Biopsy   Showed hypercellularity with reticulum fibrosis, JAK-2 mutation positive, BCR/ABL negative, 46 XY in 20 metaphase, and Intermediate-1 ,DIPSS of 2. (WBC < 25; Hbg > 10;    01/02/2013 Imaging   US abdomen complete revealed Mild splenogmealy (13.3 cm) and a 2.2 x 1.7 x 1.8 cm left renal cyst   01/15/2013 Pathology Results   Integrated hematopathology report.  Myeloproliferative neoplasm (MPN) w JAK2 [id number]  mutation, panhyperplasia, increased aytypical megakaryocytes and diffuse mild reticulin fibrosis c/w primary myelofibrosis.    03/17/2013 Imaging   CT Chest: Crowded peribronchial markings noted, associated with linear densities involving the left base, probably representing areas of platelike atelectasis/scarring.  No mass or infiltrate seen.    03/19/2013 - 07/20/2013 Chemotherapy   Started Jakafi 20 mg bid. Dose reduced due to anemia.  Stopped due to increasing fatigue while on jakafi.    04/28/2013 - 05/01/2013 Hospital Admission   Admitted to [facility] by Dr. Narda Amber, his cardiologist, due to the fact that he had midsternal chest pain.  He had a coronary stent placed this admission.    05/28/2013 - 05/29/2013 Hospital Admission   Admitted to [facility] and received 3 units of blood (Dr. Oren Bracket note).    06/10/2013 Surgery   Cololonoscopy due to GIB.  C/w diverticular disease and hemorrhoids but no active bleeding.    10/05/2013 -  Chemotherapy   Started Hydrea 500 mg daily.   03/19/2014 -  Chemotherapy   Hydrea increased to 500 mg bid wbc 17, hb 17, hct 54, plt 437   06/29/2014 Procedure   cbc shows wbc 18.8 hb 18.7 hct 58.9 plt 456. ?compliance w hydrea as MCV not elevated. symptomatic start TP weekly x 4   07/28/2014 Procedure   TP every 2 weeks if hct > 45, changed to monthly in April 2016      INTERVAL HISTORY:  JAICION Whitehead is here for a follow up of myelofibrosis . He was last seen by me on  11/05/2022. He presents to the clinic alone in a automobile wheelchair.  He is clinically doing well, has moderate fatigue, still able to live independently.  He has some heartburn, no other new complaints.     All other systems were reviewed with the patient and are negative.  MEDICAL HISTORY:  Past Medical History:  Diagnosis Date   CAD (coronary artery disease)    a. S/p 3V CABG 2000 with LIMA-LAD, left radial-PDA, SVG-OM1. b. 2014: stent to  SVG-OM1; then found to have obstruction distal to LAD anastamosis of LIMA s/p DES.    CHF (congestive heart failure) (HCC)    Diverticular disease    DM (diabetes mellitus) (HCC)    Hemorrhoid    HTN (hypertension)    Hyperlipidemia    Peripheral vascular disease (HCC)    a. R femoropopliteal bypass ~2012 in [place], reportedly shown to be occluded. Now followed by Dr. Allyson Sabal.   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CC)    RBBB     SURGICAL HISTORY: Past Surgical History:  Procedure Laterality Date   ABDOMINAL ANGIOGRAM N/A 01/18/2015   Procedure: ABDOMINAL ANGIOGRAM;  Surgeon: Nada Libman, MD;  Location: [facility] CATH LAB;  Service: Cardiovascular;  Laterality: N/A;   ABDOMINAL AORTAGRAM N/A 09/21/2014   Procedure: ABDOMINAL Ronny Flurry;  Surgeon: Sherren Kerns, MD;  Location: [facility] CATH LAB;  Service: Cardiovascular;  Laterality: N/A;   ABDOMINAL AORTOGRAM W/LOWER EXTREMITY N/A 03/28/2021   Procedure: ABDOMINAL AORTOGRAM W/LOWER EXTREMITY;  Surgeon: Nada Libman, MD;  Location: [facility] INVASIVE CV LAB;  Service: Cardiovascular;  Laterality: N/A;   CARDIAC CATHETERIZATION  May 2014   CORONARY ARTERY BYPASS GRAFT  2000   LM occluded, LIMA to LAD patent but distal native LAD disease, saphenous vein bypass graft to OM with 95% stenosis in the midportion, right coronary artery occluded, radial artery graft to right coronary artery patent but was diffusely small. Marginal graft stented. 7 2014. Of note he had multiple previous stents in the right coronary artery. The distal LAD was treated with angioplasty. I   CORONARY STENT INTERVENTION N/A 01/03/2023   Procedure: CORONARY STENT INTERVENTION;  Surgeon: Swaziland, Peter M, MD;  Location: [facility] INVASIVE CV LAB;  Service: Cardiovascular;  Laterality: N/A;   LEFT HEART CATH AND CORS/GRAFTS ANGIOGRAPHY N/A 01/03/2023   Procedure: LEFT HEART CATH AND CORS/GRAFTS ANGIOGRAPHY;  Surgeon: Swaziland, Peter M, MD;  Location: [facility] INVASIVE CV LAB;  Service: Cardiovascular;  Laterality: N/A;    LOOP RECORDER IMPLANT N/A 07/12/2014   Procedure: LOOP RECORDER IMPLANT;  Surgeon: Marinus Maw, MD;  Location: [facility] CATH LAB;  Service: Cardiovascular;  Laterality: N/A;   LOWER EXTREMITY ANGIOGRAM  09/21/2014   Procedure: LOWER EXTREMITY ANGIOGRAM;  Surgeon: Sherren Kerns, MD;  Location: [facility] CATH LAB;  Service: Cardiovascular;;   PERCUTANEOUS CORONARY STENT INTERVENTION (PCI-S)  April 27 2013   right leg amputation  2017   TEE WITHOUT CARDIOVERSION N/A 07/12/2014   Procedure: TRANSESOPHAGEAL ECHOCARDIOGRAM (TEE);  Surgeon: Pricilla Riffle, MD;  Location: [facility] ENDOSCOPY;  Service: Cardiovascular;  Laterality: N/A;   VEIN SURGERY      I have reviewed the social history and family history with the patient and they are unchanged from previous note.  ALLERGIES:  is allergic to other.  MEDICATIONS:  Current Outpatient Medications  Medication Sig Dispense Refill   aspirin 81 MG EC tablet Take 81 mg by mouth daily.     atorvastatin (LIPITOR) 40 MG tablet Take 40 mg by mouth at bedtime.  carvedilol (COREG) 6.25 MG tablet TAKE 1 TABLET TWICE DAILY  WITH MEALS (DOSE INCREASE) 180 tablet 3   clopidogrel (PLAVIX) 75 MG tablet Take 1 tablet (75 mg total) by mouth daily. 30 tablet 3   fluticasone (FLONASE) 50 MCG/ACT nasal spray Place 1 spray into both nostrils daily as needed for allergies.     hydroxyurea (HYDREA) 500 MG capsule Take 3 capsules in morning. 90 capsule 2   insulin glargine (LANTUS) 100 UNIT/ML injection Inject 10 Units into the skin at bedtime.     insulin lispro (HUMALOG) 100 UNIT/ML injection Inject 10 Units into the skin 2 (two) times daily as needed for high blood sugar.     isosorbide mononitrate (IMDUR) 60 MG 24 hr tablet TAKE 2 TABLETS DAILY 180 tablet 2   Lancets (ONETOUCH DELICA PLUS LANCET33G) MISC Apply topically 2 (two) times daily.     levocetirizine (XYZAL) 5 MG tablet Take 5 mg by mouth every evening.     losartan (COZAAR) 25 MG tablet Take 25 mg by mouth at bedtime.      nitroGLYCERIN (NITROSTAT) 0.4 MG SL tablet Place 1 tablet (0.4 mg total) under the tongue every 5 (five) minutes as needed for chest pain. 25 tablet 3   ONETOUCH VERIO test strip 1 each 2 (two) times daily.     PROAIR HFA 108 (90 BASE) MCG/ACT inhaler Inhale 1 puff into the lungs every 4 (four) hours as needed for wheezing or shortness of breath.      No current facility-administered medications for this visit.    PHYSICAL EXAMINATION: ECOG PERFORMANCE STATUS: 1 - Symptomatic but completely ambulatory  Vitals:   05/06/23 1221  BP: 118/64  Pulse: 78  Resp: 18  Temp: (!) 97.5 F (36.4 C)  SpO2: 99%   Wt Readings from Last 3 Encounters:  05/06/23 158 lb 9.6 oz (71.9 kg)  01/17/23 160 lb 3.2 oz (72.7 kg)  01/03/23 160 lb (72.6 kg)     GENERAL:alert, no distress and comfortable SKIN: skin color, texture, turgor are normal, no rashes or significant lesions EYES: normal, Conjunctiva are pink and non-injected, sclera clear NECK: supple, thyroid normal size, non-tender, without nodularity LYMPH:  no palpable lymphadenopathy in the cervical, axillary  LUNGS: clear to auscultation and percussion with normal breathing effort HEART: regular rate & rhythm and no murmurs and no lower extremity edema ABDOMEN:abdomen soft, non-tender and normal bowel sounds Musculoskeletal:no cyanosis of digits and no clubbing  NEURO: alert & oriented x 3 with fluent speech, no focal motor/sensory deficits  LABORATORY DATA:  I have reviewed the data as listed    Latest Ref Rng & Units 05/06/2023   12:07 PM 01/25/2023   12:16 PM 01/04/2023    2:06 AM  CBC  WBC 4.0 - 10.5 K/uL 17.0  13.6  13.4   Hemoglobin 13.0 - 17.0 g/dL 09.8  11.9  14.7   Hematocrit 39.0 - 52.0 % 35.0  34.3  34.9   Platelets 150 - 400 K/uL 441  477  483         Latest Ref Rng & Units 05/06/2023   12:07 PM 01/25/2023   12:16 PM 01/04/2023    2:06 AM  CMP  Glucose 70 - 99 mg/dL 829  562  98   BUN 8 - 23 mg/dL 21  13  17     Creatinine 0.61 - 1.24 mg/dL 1.30  8.65  7.84   Sodium 135 - 145 mmol/L 139  140  141   Potassium 3.5 -  5.1 mmol/L 4.4  4.9  4.2   Chloride 98 - 111 mmol/L 106  108  107   CO2 22 - 32 mmol/L 28  31  27    Calcium 8.9 - 10.3 mg/dL 8.8  9.2  8.7   Total Protein 6.5 - 8.1 g/dL 5.6  6.2    Total Bilirubin 0.3 - 1.2 mg/dL 0.4  0.5    Alkaline Phos 38 - 126 U/L 99  87    AST 15 - 41 U/L 12  12    ALT 0 - 44 U/L 8  8        RADIOGRAPHIC STUDIES: I have personally reviewed the radiological images as listed and agreed with the findings in the report. No results found.    No orders of the defined types were placed in this encounter.  All questions were answered. The patient knows to call the clinic with any problems, questions or concerns. No barriers to learning was detected. The total time spent in the appointment was 20 minutes.     Malachy Mood, MD 05/06/2023   Carolin Coy, CMA, am acting as scribe for Malachy Mood, MD.   I have reviewed the above documentation for accuracy and completeness, and I agree with the above.

## 2023-05-08 DIAGNOSIS — I6529 Occlusion and stenosis of unspecified carotid artery: Secondary | ICD-10-CM | POA: Insufficient documentation

## 2023-05-08 NOTE — Progress Notes (Unsigned)
Cardiology Office Note:   Date:  05/09/2023  ID:  Tyler Whitehead, DOB December 05, 1940, MRN [id number] PCP: Default, Provider, MD  [facility] Providers Cardiologist:  Rollene Rotunda, MD {  History of Present Illness:   Tyler Whitehead is a 82 y.o. male who presents for follow up of CAD/CABG.  I saw him last in 2017.  He moved to [place] and was moving back.   He has CAD he has had CABG and PVD. His last cath [place] suggested that "Medical therapy should be enhanced if the patient has ongoing exertional chest discomfort despite maximal medical management and if he has evidence of inferior ischemia on a perfusion study then repeat PCI of the native right coronary artery could be considered. However, given the patient's track record the right coronary artery is not likely to remain open even if PCI could be accomplished."  He is also managed for lower extremity disease as seen by Dr. Myra Gianotti.  He has had a CVA of unclear etiology had an implantable loop by Dr. Ladona Ridgel.  Cardiac catheterization on 05/11/2020 in [place] demonstrated 100% left main and mid RCA in-stent lesions.  LIMA to the LAD apparently was patent collaterals to the right coronary artery.  He had a patent vein graft to an OM 1 with a patent stent in the vein graft.  He had an occluded vein graft to the RCA.  He had 100% left SFA occlusion.  He did have retrograde cannulation of his left tibial peroneal trunk and had this angioplasty.  Carotid Dopplers demonstrated 40 to 50% stenosis on the left.  Echo demonstrated an EF of 60%.    He was continuing to have chest pain and was found on cath early 2024 to have high grade stenosis of a SVG to the OM and had this stented.  Since then he has continued to have some chest discomfort.  I suspect stable anginal pattern where he has still occasionally taken nitroglycerin.  But he thinks that this is only when he is emotionally stressed.  He has a prosthesis and he gets up and walks around with this and he  does not necessarily have discomfort with this.  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  Long distances he gets around in a wheelchair.    ROS: As stated in the HPI and negative for all other systems.  Studies Reviewed:    EKG:  NA   Risk Assessment/Calculations:              Physical Exam:   VS:  BP (!) 118/58 (BP Location: Left Arm, Patient Position: Sitting, Cuff Size: Normal)   Pulse 69   Ht 5\' 7"  (1.702 m)   Wt 149 lb 3.2 oz (67.7 kg)   SpO2 97%   BMI 23.37 kg/m    Wt Readings from Last 3 Encounters:  05/09/23 149 lb 3.2 oz (67.7 kg)  05/06/23 158 lb 9.6 oz (71.9 kg)  01/17/23 160 lb 3.2 oz (72.7 kg)     GEN: Well nourished, well developed in no acute distress NECK: No JVD; No carotid bruits CARDIAC: RRR, no murmurs, rubs, gallops RESPIRATORY:  Clear to auscultation without rales, wheezing or rhonchi  ABDOMEN: Soft, non-tender, non-distended EXTREMITIES:  No edema; No deformity   ASSESSMENT AND PLAN:   CAD: S/p prior stenting to the RCA CABG x 3 (LIMA-LAD, left radial-PDA, SVG-OM) in 2000, DES-SVG-OM in 2014, DES-SVG-OM in 12/2021.  He has occluded free radial graft to PDA, RCA, not  suitable to CTO: He is not status post PCI to an SVG to the OM although he has not had much relief in a stable anginal pattern from this.  He is tolerating the meds as listed.  He thinks he is doing relatively well.   Hypertension: The blood pressure is at target. No change in medications is indicated. We will continue with therapeutic lifestyle changes (TLC).  Hyperlipidemia: LDL was 56 with an HDL of 30.  No change in therapy.   Carotid artery stenosis/PAD: He is being followed by [facility]  Type 2 diabetes: A1c was 6.9.  Of note he has not been taking his nighttime insulin because he says his sugars been okay.  Have asked him to talk this over with his primary provider.    History of CVA:   He remains on aspirin and Plavix.           Follow up APP in six months.    Signed, Rollene Rotunda, MD

## 2023-05-09 ENCOUNTER — Ambulatory Visit: Payer: No Typology Code available for payment source | Attending: Cardiology | Admitting: Cardiology

## 2023-05-09 ENCOUNTER — Encounter: Payer: Self-pay | Admitting: Cardiology

## 2023-05-09 VITALS — BP 118/58 | HR 69 | Ht 67.0 in | Wt 149.2 lb

## 2023-05-09 DIAGNOSIS — I10 Essential (primary) hypertension: Secondary | ICD-10-CM | POA: Diagnosis not present

## 2023-05-09 DIAGNOSIS — I25118 Atherosclerotic heart disease of native coronary artery with other forms of angina pectoris: Secondary | ICD-10-CM | POA: Diagnosis not present

## 2023-05-09 DIAGNOSIS — I6529 Occlusion and stenosis of unspecified carotid artery: Secondary | ICD-10-CM

## 2023-05-09 DIAGNOSIS — E785 Hyperlipidemia, unspecified: Secondary | ICD-10-CM

## 2023-05-09 NOTE — Patient Instructions (Signed)
  Follow-Up: At [facility], you and your health needs are our priority.  As part of our continuing mission to provide you with exceptional heart care, we have created designated Provider Care Teams.  These Care Teams include your primary Cardiologist (physician) and Advanced Practice Providers (APPs -  Physician Assistants and Nurse Practitioners) who all work together to provide you with the care you need, when you need it.  We recommend signing up for the patient portal called "MyChart".  Sign up information is provided on this After Visit Summary.  MyChart is used to connect with patients for Virtual Visits (Telemedicine).  Patients are able to view lab/test results, encounter notes, upcoming appointments, etc.  Non-urgent messages can be sent to your provider as well.   To learn more about what you can do with MyChart, go to [email or web address].    Your next appointment:   6 month(s)  Provider:   James Hochrein, MD      

## 2023-05-15 ENCOUNTER — Encounter: Payer: Self-pay | Admitting: Podiatry

## 2023-05-15 ENCOUNTER — Ambulatory Visit: Payer: No Typology Code available for payment source | Admitting: Podiatry

## 2023-05-15 DIAGNOSIS — M79675 Pain in left toe(s): Secondary | ICD-10-CM

## 2023-05-15 DIAGNOSIS — B351 Tinea unguium: Secondary | ICD-10-CM

## 2023-05-17 NOTE — Progress Notes (Signed)
Subjective:   Patient ID: Tyler Whitehead, male   DOB: 82 y.o.   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

## 2023-06-26 NOTE — Progress Notes (Unsigned)
Cardiology Clinic Note   Patient Name: Tyler Whitehead Date of Encounter: 07/01/2023  Primary Care Provider:  Default, Provider, MD Primary Cardiologist:  Rollene Rotunda, MD  Patient Profile    Tyler Whitehead 82 year old male presents to the clinic today for follow-up evaluation of his coronary artery disease and hypertension.  Past Medical History    Past Medical History:  Diagnosis Date   CAD (coronary artery disease)    a. S/p 3V CABG 2000 with LIMA-LAD, left radial-PDA, SVG-OM1. b. 2014: stent to SVG-OM1; then found to have obstruction distal to LAD anastamosis of LIMA s/p DES.    CHF (congestive heart failure) (HCC)    Diverticular disease    DM (diabetes mellitus) (HCC)    Hemorrhoid    HTN (hypertension)    Hyperlipidemia    Peripheral vascular disease (HCC)    a. R femoropopliteal bypass ~2012 in [place], reportedly shown to be occluded. Now followed by Dr. Allyson Sabal.   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CC)    RBBB    Past Surgical History:  Procedure Laterality Date   ABDOMINAL ANGIOGRAM N/A 01/18/2015   Procedure: ABDOMINAL ANGIOGRAM;  Surgeon: Nada Libman, MD;  Location: [facility] CATH LAB;  Service: Cardiovascular;  Laterality: N/A;   ABDOMINAL AORTAGRAM N/A 09/21/2014   Procedure: ABDOMINAL Ronny Flurry;  Surgeon: Sherren Kerns, MD;  Location: [facility] CATH LAB;  Service: Cardiovascular;  Laterality: N/A;   ABDOMINAL AORTOGRAM W/LOWER EXTREMITY N/A 03/28/2021   Procedure: ABDOMINAL AORTOGRAM W/LOWER EXTREMITY;  Surgeon: Nada Libman, MD;  Location: [facility] INVASIVE CV LAB;  Service: Cardiovascular;  Laterality: N/A;   CARDIAC CATHETERIZATION  May 2014   CORONARY ARTERY BYPASS GRAFT  2000   LM occluded, LIMA to LAD patent but distal native LAD disease, saphenous vein bypass graft to OM with 95% stenosis in the midportion, right coronary artery occluded, radial artery graft to right coronary artery patent but was diffusely small. Marginal graft stented. 7 2014. Of note he had multiple previous  stents in the right coronary artery. The distal LAD was treated with angioplasty. I   CORONARY STENT INTERVENTION N/A 01/03/2023   Procedure: CORONARY STENT INTERVENTION;  Surgeon: Swaziland, Peter M, MD;  Location: [facility] INVASIVE CV LAB;  Service: Cardiovascular;  Laterality: N/A;   LEFT HEART CATH AND CORS/GRAFTS ANGIOGRAPHY N/A 01/03/2023   Procedure: LEFT HEART CATH AND CORS/GRAFTS ANGIOGRAPHY;  Surgeon: Swaziland, Peter M, MD;  Location: [facility] INVASIVE CV LAB;  Service: Cardiovascular;  Laterality: N/A;   LOOP RECORDER IMPLANT N/A 07/12/2014   Procedure: LOOP RECORDER IMPLANT;  Surgeon: Marinus Maw, MD;  Location: [facility] CATH LAB;  Service: Cardiovascular;  Laterality: N/A;   LOWER EXTREMITY ANGIOGRAM  09/21/2014   Procedure: LOWER EXTREMITY ANGIOGRAM;  Surgeon: Sherren Kerns, MD;  Location: [facility] CATH LAB;  Service: Cardiovascular;;   PERCUTANEOUS CORONARY STENT INTERVENTION (PCI-S)  April 27 2013   right leg amputation  2017   TEE WITHOUT CARDIOVERSION N/A 07/12/2014   Procedure: TRANSESOPHAGEAL ECHOCARDIOGRAM (TEE);  Surgeon: Pricilla Riffle, MD;  Location: [facility] ENDOSCOPY;  Service: Cardiovascular;  Laterality: N/A;   VEIN SURGERY      Allergies  Allergies  Allergen Reactions   Other     Other reaction(s): Unknown    History of Present Illness    Tyler Whitehead has a PMH of coronary artery disease status post CABG x3, PVD, hypertension, CVA, diverticular disease, type 2 diabetes, dyslipidemia, seizure, vitamin D deficiency, memory deficit, right above-the-knee amputation, and gait abnormality.  Cardiac catheterization  05/11/2020 in [place] showed 100% left main and mid RCA in-stent lesions.  He was noted to have LIMA-LAD patent collaterals to RCA.  Patent SVG-OM1, occluded vein graft to RCA.  He was noted to have a 100% left SFA occlusion.  Medical management was recommended.  He underwent retrograde cannulation of his left tibial peroneal trunk with angioplasty.  His carotid Dopplers showed 40-50%  stenosis in the left.  His echocardiogram showed an EF of 60%.  He was seen by Dr. Antoine Poche in 2017.  He had moved back to [place] and was relocating to the area.  His catheterization in [place] showed that medical therapy may enhance ongoing exertional chest discomfort.  He was noted to have ongoing exertional chest discomfort despite maximal medical management.  It was felt that he may benefit from PCI of his RCA.  It was felt that due to his PMH is RCA may not remain open if PCI was not attempted.  His PVD was managed by Dr. Myra Gianotti.  He had a loop recorder implanted (Dr. Ladona Ridgel) after he had CVA with unclear etiology.  He followed up with Dr. Antoine Poche 12/28/2021.  He continued to have chest discomfort however, it appeared to be in a stable exertional pattern.  He reported taking 3 bottles of nitroglycerin the previous year.  At the time of the evaluation he had not had any nitroglycerin in the last 2 or 3 weeks.  He noted his chest discomfort when he would move quickly with his walker and his prosthesis.  He denied chest discomfort at rest.  He denied orthopnea and PND.  He denied palpitations presyncope and syncope.  He felt that his symptoms were unchanged since his catheterization in 2021.  There was some confusion noted surrounding his medications.  Follow-up with pharmacy to review his medications was planned.  His Imdur was increased to 120 mg daily.  He presented to the clinic 03/30/22 for follow-up evaluation and stated he had been doing fairly well.  He reported that he had occasional falls due to weakness in his right upper extremity and his mobility.  His blood pressure was well controlled ,122/50.  He had been tolerating his medications well.  He also had some issues with his apartment and had been using a wheelchair occasionally to get around as needed.  He did well with physical therapy but needed assistance in doing follow-up exercises.  I  continue his  medication regimen, renewed physical  therapy for another 6 weeks, had him continue his diet and planned follow-up in 6 months.  He presented to the clinic 09/25/22 for follow-up evaluation stated he had been having intermittent episodes of sharp right sided chest pain. He took nirtoglycerin three times per week at times but, may not need to take nitroglycerin for a month at at time. He reported the chest pain with increased physical activity, "rushing". I reviewed options for management and shared decision making was used to decide to increse his Imdur with 60 mg in the AM and 120 mg in the PM. He continued to be somewhat physically active and was working on a heart healthy carb modified diet. He would have labs drawn at his PCP in the near future which we requested. I planned follow up in 3 months.  He was seen in follow-up by Dr. Antoine Poche on 05/09/2023.  He underwent cardiac catheterization early 2024 which showed high-grade stenosis of his SVG-OM which was stented.  Since that time he continued to have some chest discomfort.  It  was felt he was having stable angina.  He continued to occasionally take nitroglycerin.  This was in a pattern of emotional stress.  With ambulation he denied chest discomfort.  For long distances he was using a wheelchair.  For short distances he was using his prosthesis and ambulating.  He presents to the clinic today for follow-up evaluation and states he feels well today.  He continues to take nitroglycerin intermittently for chest discomfort.  His chest discomfort is chronic.  He does not feel that his nitroglycerin is working as well.  He reports the last bottle he has not noticed that the sublingual nitroglycerin causes his tongue to tingle like it previously did.  I will refill his nitroglycerin.  His symptoms are atypical.  I reviewed his catheterization and stenting.  He continues to ambulate with his prosthesis but is having trouble with his right lower extremity prosthesis malfunctioning and plans to have it  fixed in the near future.  He uses his motorized wheelchair for longer distances.  He is planning to travel for the holidays.  His blood pressure today is 106/50.  His pulse is noted to be 68.  We will plan follow-up in 4 to 6 months.   Today he denies chest pain, shortness of breath, lower extremity edema, fatigue, palpitations, melena, hematuria, hemoptysis, diaphoresis, weakness, presyncope, syncope, orthopnea, and PND.   Home Medications    Prior to Admission medications   Medication Sig Start Date End Date Taking? Authorizing Provider  aspirin 81 MG EC tablet 1 tablet Orally Once a day    [provider]  atorvastatin (LIPITOR) 40 MG tablet Take 40 mg by mouth at bedtime.    [provider]  carvedilol (COREG) 6.25 MG tablet Take 1 tablet (6.25 mg total) by mouth 2 (two) times daily with a meal. 12/28/21   Rollene Rotunda, MD  cephALEXin (KEFLEX) 500 MG capsule Take 500 mg by mouth 4 (four) times daily.    [provider]  cilostazol (PLETAL) 100 MG tablet Take 100 mg by mouth 2 (two) times daily.    [provider]  fluticasone (FLONASE) 50 MCG/ACT nasal spray 1 spray in each nostril 11/08/21   [provider]  hydroxyurea (HYDREA) 500 MG capsule Take 2 capsules in morning and 1 capsule in evening Patient taking differently: Take 2 capsules in morning. 06/26/21   Malachy Mood, MD  insulin glargine (LANTUS) 100 UNIT/ML injection Inject 10 Units into the skin at bedtime.    [provider]  insulin lispro (HUMALOG) 100 UNIT/ML injection Inject 4 Units into the skin daily as needed for high blood sugar.    [provider]  isosorbide mononitrate (IMDUR) 60 MG 24 hr tablet Take 2 tablets (120 mg total) by mouth daily. 01/16/22   Rollene Rotunda, MD  ketoconazole (NIZORAL) 2 % cream Apply 1 application topically daily. 11/21/21   McDonald, Rachelle Hora, DPM  Lancets (ONETOUCH DELICA PLUS [place]) MISC Apply topically 2 (two) times daily. 12/28/20    [provider]  levocetirizine (XYZAL) 5 MG tablet Take 5 mg by mouth every evening.    [provider]  losartan (COZAAR) 25 MG tablet Take 25 mg by mouth at bedtime.    [provider]  mirtazapine (REMERON) 30 MG tablet 1 tablet at bedtime Orally Once a day    [provider]  nitroGLYCERIN (NITROSTAT) 0.4 MG SL tablet Place 1 tablet (0.4 mg total) under the tongue every 5 (five) minutes as needed for chest  pain. 12/28/21   Rollene Rotunda, MD  [facility] VERIO test strip 1 each 2 (two) times daily. 01/25/21   [provider]  PROAIR HFA 108 (90 BASE) MCG/ACT inhaler Inhale 1 puff into the lungs every 4 (four) hours as needed for wheezing or shortness of breath.  12/03/13   [provider]  sitaGLIPtin (JANUVIA) 100 MG tablet 1 tablet Orally Once a day for 90 day(s) 06/26/18   [provider]  isosorbide mononitrate (IMDUR) 60 MG 24 hr tablet Take 1-2 tablets (60-120 mg total) by mouth 2 (two) times daily. 2 in the morning and 1 in the evening 09/22/20   Rollene Rotunda, MD    Family History    Family History  Problem Relation Age of Onset   Asthma Father    Heart disease Father    Hyperlipidemia Maternal Grandmother    CAD Other        Multiple siblings   Cancer Other        niece had lung cancer   Cancer Paternal Uncle        brain cancer    He indicated that his mother is deceased. He indicated that his father is deceased. He indicated that the status of his maternal grandmother is unknown. He indicated that the status of his paternal uncle is unknown. He indicated that the status of his other is unknown.  Social History    Social History   Socioeconomic History   Marital status: Divorced    Spouse name: Not on file   Number of children: Not on file   Years of education: Not on file   Highest education level: Not on file  Occupational History   Occupation: Limo driver  Tobacco Use   Smoking status: Never    Smokeless tobacco: Never  Substance and Sexual Activity   Alcohol use: Yes    Alcohol/week: 0.0 standard drinks of alcohol    Comment: sometimes    Drug use: No   Sexual activity: Not on file  Other Topics Concern   Not on file  Social History Narrative   Lives with wife.    Social Determinants of Health   Financial Resource Strain: Not on file  Food Insecurity: Not on file  Transportation Needs: Unmet Transportation Needs (12/28/2021)   PRAPARE - Administrator, Civil Service (Medical): Yes    Lack of Transportation (Non-Medical): Yes  Physical Activity: Not on file  Stress: Not on file  Social Connections: Not on file  Intimate Partner Violence: Not on file     Review of Systems    General:  No chills, fever, night sweats or weight changes.  Cardiovascular:  No chest pain, dyspnea on exertion, edema, orthopnea, palpitations, paroxysmal nocturnal dyspnea. Dermatological: No rash, lesions/masses Respiratory: No cough, dyspnea Urologic: No hematuria, dysuria Abdominal:   No nausea, vomiting, diarrhea, bright red blood per rectum, melena, or hematemesis Neurologic:  No visual changes, wkns, changes in mental status. All other systems reviewed and are otherwise negative except as noted above.  Physical Exam    VS:  BP (!) 106/50   Pulse 68   Ht 5\' 7"  (1.702 m)   Wt 159 lb (72.1 kg)   SpO2 98%   BMI 24.90 kg/m  , BMI Body mass index is 24.9 kg/m. GEN: Well nourished, well developed, in no acute distress. HEENT: normal. Neck: Supple, no JVD, carotid bruits, or masses. Cardiac: RRR, no murmurs, rubs, or gallops. No clubbing, cyanosis, edema.  Radials/DP/PT  2+ and equal bilaterally.  Respiratory:  Respirations regular and unlabored, clear to auscultation bilaterally. GI: Soft, nontender, nondistended, BS + x 4. MS: no deformity or atrophy. Skin: warm and dry, no rash. Neuro:  Strength and sensation are intact. Psych: Normal affect.  Accessory Clinical  Findings    Recent Labs: 05/06/2023: ALT 8; BUN 21; Creatinine 1.08; Hemoglobin 11.3; Platelet Count 441; Potassium 4.4; Sodium 139   Recent Lipid Panel    Component Value Date/Time   CHOL 108 01/04/2023 0251   TRIG 109 01/04/2023 0251   HDL 30 (L) 01/04/2023 0251   CHOLHDL 3.6 01/04/2023 0251   VLDL 22 01/04/2023 0251   LDLCALC 56 01/04/2023 0251    ECG personally reviewed by me today- none today.  EKG 09/25/2022 normal sinus rhythm left axis deviation right bundle branch block left ventricular hypertrophy 98 bpm no acute changes.  Echocardiogram 07/12/2014 Indications:      CVA 436.                   Diagnostic  transesophageal echocardiography.  2D and color Doppler.  Birthdate:  Patient birthdate: 1941/02/12.  Age:  Patient is 82 yr  old.  Sex:  Gender: male.    BMI: 30 kg/m^2.  Blood pressure:  139/80  Patient status:  Inpatient.  Study date:  Study date:  07/12/2014. Study time: 11:26 AM.  Location:  Endoscopy.   -------------------------------------------------------------------   -------------------------------------------------------------------  Left ventricle:  LVEF is normal.   -------------------------------------------------------------------  Aortic valve:  AV is minimally thickend. No AI.   -------------------------------------------------------------------  Aorta:  MIld fixed plaque in thoracic aorta.   -------------------------------------------------------------------  Mitral valve:  MV is normal MIld MR.   -------------------------------------------------------------------  Left atrium:   No evidence of thrombus in the atrial cavity or  appendage.  No evidence of thrombus in the atrial cavity or  appendage.   -------------------------------------------------------------------  Atrial septum:  No PFO as teste with injection of agitated saline  or with color doppler.   -------------------------------------------------------------------  Pulmonic  valve:   PV is normal.   -------------------------------------------------------------------  Tricuspid valve:  TV is normal No TR>     -------------------------------------------------------------------  Post procedure conclusions  Ascending Aorta:   - MIld fixed plaque in thoracic aorta.   -------------------------------------------------------------------  Prepared and Electronically Authenticated by   Dietrich Pates, M.D.     Assessment & Plan   1.  Coronary artery disease-continues to have chronic chest discomfort.  Stable.  No exertional chest pain.  Underwent cardiac catheterization and received PCI with DES to his SVG-OM early 2024.   He is status post CABG x3 2000. Catheterization 05/11/2020 in [place] showed 100% left main and mid RCA in-stent lesions.  He was noted to have LIMA-LAD patent collaterals to RCA.  Patent SVG-OM1, occluded vein graft to RCA. Continue Imdur, carvedilol, atorvastatin, aspirin, Plavix, nitroglycerin Heart healthy low-sodium diet  Maintain physical activity  Essential hypertension-BP today 106/50.   Maintain blood pressure log Continue carvedilol, losartan Heart healthy low-sodium diet-salty 6 diet sheet reviewed  Hyperlipidemia-LDL 56 on 01/04/23 Continue aspirin, atorvastatin Heart healthy low-sodium high-fiber diet Increase physical activity as tolerated Plan for repeat lipids and LFTs 1/25  Peripheral vascular disease-continues to use prosthesis with ambulation.  Wheelchair for long distances.  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 Follows with [facility]  Diabetes mellitus-A1c 6.9 on 01/04/23 Heart healthy low-sodium carb modified diet Continue insulin  Disposition: Follow-up with Dr. Antoine Poche or me in 4-6 months.  Thomasene Ripple. Elic Vencill NP-C  07/01/2023, 4:21 PM [facility] [address] Office [phone] Fax [phone]  Notice: This dictation was prepared with Dragon dictation along  with smaller phrase technology. Any transcriptional errors that result from this process are unintentional and may not be corrected upon review.  I spent 14 minutes examining this patient, reviewing medications, and using patient centered shared decision making involving her cardiac care.  Prior to her visit I spent greater than 20 minutes reviewing her past medical history,  medications, and prior cardiac tests.

## 2023-06-29 ENCOUNTER — Other Ambulatory Visit: Payer: Self-pay | Admitting: General Practice

## 2023-07-01 ENCOUNTER — Encounter: Payer: Self-pay | Admitting: General Practice

## 2023-07-01 ENCOUNTER — Ambulatory Visit: Payer: No Typology Code available for payment source | Attending: General Practice | Admitting: General Practice

## 2023-07-01 VITALS — BP 106/50 | HR 68 | Ht 67.0 in | Wt 159.0 lb

## 2023-07-01 DIAGNOSIS — I25118 Atherosclerotic heart disease of native coronary artery with other forms of angina pectoris: Secondary | ICD-10-CM | POA: Diagnosis not present

## 2023-07-01 DIAGNOSIS — I739 Peripheral vascular disease, unspecified: Secondary | ICD-10-CM

## 2023-07-01 DIAGNOSIS — E118 Type 2 diabetes mellitus with unspecified complications: Secondary | ICD-10-CM | POA: Diagnosis not present

## 2023-07-01 DIAGNOSIS — E785 Hyperlipidemia, unspecified: Secondary | ICD-10-CM | POA: Diagnosis not present

## 2023-07-01 DIAGNOSIS — I10 Essential (primary) hypertension: Secondary | ICD-10-CM | POA: Diagnosis not present

## 2023-07-01 MED ORDER — NITROGLYCERIN 0.4 MG SL SUBL: 0.4000 mg | SUBLINGUAL_TABLET | SUBLINGUAL | 3 refills | Status: DC | PRN

## 2023-07-01 MED ORDER — NITROGLYCERIN 0.4 MG SL SUBL: 0.4000 mg | SUBLINGUAL_TABLET | SUBLINGUAL | 1 refills | Status: DC | PRN

## 2023-07-01 NOTE — Patient Instructions (Signed)
Medication Instructions:  The current medical regimen is effective;  continue present plan and medications as directed. Please refer to the Current Medication list given to you today.  *If you need a refill on your cardiac medications before your next appointment, please call your pharmacy*  Lab Work: NONE If you have labs (blood work) drawn today and your tests are completely normal, you will receive your results only by: MyChart Message (if you have MyChart) OR A paper copy in the mail If you have any lab test that is abnormal or we need to change your treatment, we will call you to review the results.  Testing/Procedures: NONE  Follow-Up: At [facility], you and your health needs are our priority.  As part of our continuing mission to provide you with exceptional heart care, we have created designated Provider Care Teams.  These Care Teams include your primary Cardiologist (physician) and Advanced Practice Providers (APPs -  Physician Assistants and Nurse Practitioners) who all work together to provide you with the care you need, when you need it.  Your next appointment:   4-6 month(s)  Provider:   Rollene Rotunda, MD     Other Instructions

## 2023-07-24 DIAGNOSIS — R269 Unspecified abnormalities of gait and mobility: Secondary | ICD-10-CM | POA: Diagnosis not present

## 2023-07-24 DIAGNOSIS — E1122 Type 2 diabetes mellitus with diabetic chronic kidney disease: Secondary | ICD-10-CM | POA: Diagnosis not present

## 2023-07-24 DIAGNOSIS — Z951 Presence of aortocoronary bypass graft: Secondary | ICD-10-CM | POA: Diagnosis not present

## 2023-07-24 DIAGNOSIS — Z23 Encounter for immunization: Secondary | ICD-10-CM | POA: Diagnosis not present

## 2023-07-24 DIAGNOSIS — Z8673 Personal history of transient ischemic attack (TIA), and cerebral infarction without residual deficits: Secondary | ICD-10-CM | POA: Diagnosis not present

## 2023-07-24 DIAGNOSIS — E1140 Type 2 diabetes mellitus with diabetic neuropathy, unspecified: Secondary | ICD-10-CM | POA: Diagnosis not present

## 2023-07-24 DIAGNOSIS — Z794 Long term (current) use of insulin: Secondary | ICD-10-CM | POA: Diagnosis not present

## 2023-07-24 DIAGNOSIS — Z89611 Acquired absence of right leg above knee: Secondary | ICD-10-CM | POA: Diagnosis not present

## 2023-07-24 DIAGNOSIS — D471 Chronic myeloproliferative disease: Secondary | ICD-10-CM | POA: Diagnosis not present

## 2023-07-24 DIAGNOSIS — I69351 Hemiplegia and hemiparesis following cerebral infarction affecting right dominant side: Secondary | ICD-10-CM | POA: Diagnosis not present

## 2023-07-24 DIAGNOSIS — E785 Hyperlipidemia, unspecified: Secondary | ICD-10-CM | POA: Diagnosis not present

## 2023-07-24 DIAGNOSIS — E1151 Type 2 diabetes mellitus with diabetic peripheral angiopathy without gangrene: Secondary | ICD-10-CM | POA: Diagnosis not present

## 2023-08-02 ENCOUNTER — Other Ambulatory Visit: Payer: Self-pay | Admitting: *Deleted

## 2023-08-02 ENCOUNTER — Inpatient Hospital Stay: Payer: No Typology Code available for payment source | Attending: Hematology

## 2023-08-02 DIAGNOSIS — D7581 Myelofibrosis: Secondary | ICD-10-CM | POA: Diagnosis not present

## 2023-08-02 DIAGNOSIS — I739 Peripheral vascular disease, unspecified: Secondary | ICD-10-CM

## 2023-08-02 LAB — CMP (CANCER CENTER ONLY)
ALT: 5 U/L (ref 0–44)
AST: 10 U/L — ABNORMAL LOW (ref 15–41)
Albumin: 3.5 g/dL (ref 3.5–5.0)
Alkaline Phosphatase: 103 U/L (ref 38–126)
Anion gap: 3 — ABNORMAL LOW (ref 5–15)
BUN: 23 mg/dL (ref 8–23)
CO2: 30 mmol/L (ref 22–32)
Calcium: 8.5 mg/dL — ABNORMAL LOW (ref 8.9–10.3)
Chloride: 105 mmol/L (ref 98–111)
Creatinine: 1.16 mg/dL (ref 0.61–1.24)
GFR, Estimated: >60 mL/min (ref >60)
Glucose, Bld: 175 mg/dL — ABNORMAL HIGH (ref 70–99)
Potassium: 4.2 mmol/L (ref 3.5–5.1)
Sodium: 138 mmol/L (ref 135–145)
Total Bilirubin: 0.5 mg/dL (ref 0.3–1.2)
Total Protein: 5.7 g/dL — ABNORMAL LOW (ref 6.5–8.1)

## 2023-08-02 LAB — CBC WITH DIFFERENTIAL (CANCER CENTER ONLY)
Abs Immature Granulocytes: 0.32 10*3/uL — ABNORMAL HIGH (ref 0.00–0.07)
Basophils Absolute: 0.1 10*3/uL (ref 0.0–0.1)
Basophils Relative: 1 %
Eosinophils Absolute: 0.6 10*3/uL — ABNORMAL HIGH (ref 0.0–0.5)
Eosinophils Relative: 3 %
HCT: 32.7 % — ABNORMAL LOW (ref 39.0–52.0)
Hemoglobin: 10.3 g/dL — ABNORMAL LOW (ref 13.0–17.0)
Immature Granulocytes: 2 %
Lymphocytes Relative: 7 %
Lymphs Abs: 1.2 10*3/uL (ref 0.7–4.0)
MCH: 30.4 pg (ref 26.0–34.0)
MCHC: 31.5 g/dL (ref 30.0–36.0)
MCV: 96.5 fL (ref 80.0–100.0)
Monocytes Absolute: 0.7 10*3/uL (ref 0.1–1.0)
Monocytes Relative: 4 %
Neutro Abs: 14.4 10*3/uL — ABNORMAL HIGH (ref 1.7–7.7)
Neutrophils Relative %: 83 %
Platelet Count: 481 10*3/uL — ABNORMAL HIGH (ref 150–400)
RBC: 3.39 MIL/uL — ABNORMAL LOW (ref 4.22–5.81)
RDW: 20.1 % — ABNORMAL HIGH (ref 11.5–15.5)
WBC Count: 17.4 10*3/uL — ABNORMAL HIGH (ref 4.0–10.5)
nRBC: 0.3 % — ABNORMAL HIGH (ref 0.0–0.2)

## 2023-08-06 ENCOUNTER — Encounter: Payer: Self-pay | Admitting: Hematology

## 2023-08-06 ENCOUNTER — Telehealth: Payer: Self-pay | Admitting: Podiatry

## 2023-08-06 NOTE — Telephone Encounter (Signed)
Pt called checking on status of diabetic shoes. Please call pt with update

## 2023-08-08 NOTE — Telephone Encounter (Signed)
I have emailed [facility] on this, This is the one I just sent to you I have not heard back from them and will email them again

## 2023-08-12 ENCOUNTER — Ambulatory Visit (HOSPITAL_COMMUNITY)
Admission: RE | Admit: 2023-08-12 | Discharge: 2023-08-12 | Disposition: A | Discharge status: Home / Self Care | Payer: No Typology Code available for payment source | Source: Ambulatory Visit | Attending: Surgery | Admitting: Surgery

## 2023-08-12 ENCOUNTER — Encounter: Payer: Self-pay | Admitting: Surgery

## 2023-08-12 ENCOUNTER — Ambulatory Visit (INDEPENDENT_AMBULATORY_CARE_PROVIDER_SITE_OTHER): Payer: No Typology Code available for payment source | Admitting: Surgery

## 2023-08-12 VITALS — BP 118/67 | HR 76 | Temp 97.8°F | Resp 20 | Ht 67.0 in | Wt 159.0 lb

## 2023-08-12 DIAGNOSIS — I739 Peripheral vascular disease, unspecified: Secondary | ICD-10-CM | POA: Insufficient documentation

## 2023-08-12 LAB — VAS US ABI WITH/WO TBI: Left ABI: 0.23

## 2023-08-12 NOTE — Progress Notes (Signed)
[facility] [place]  Patient name: Tyler Whitehead MRN: [id number] DOB: October 28, 1940 Sex: male   REASON FOR VISIT:    Follow-up  HISOTRY OF PRESENT ILLNESS:    ESCHOL AUXIER is a 82 y.o. male with a history of a right above-knee femoral-popliteal bypass graft with Gore-Tex, performed in [place].  This lasted approximately 2 to 3 years.  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  He has also undergone left leg bypass graft in [place].  This is known to be occluded.  There was also the possibility of a femoral-femoral graft however this was not visualized with ultrasound.  His saphenous veins were harvested during CABG.  When I saw him in March 2022, he could tolerate his claudication symptoms and so no intervention was performed.  He then returned in June 2022 with a wound and so he was taken for diagnostic angiography.  Fortunately his wound healed without the need for revascularization.  This would have been a femoral to below-knee popliteal versus tibial bypass graft.  He is back today without any wounds.  He does not have rest pain.   The patient suffers from coronary artery disease, status post CABG.  He is a diabetic.  He is medically managed for hypertension.  He takes a statin for hypercholesterolemia.  He is a non-smoker.   PAST MEDICAL HISTORY:   Past Medical History:  Diagnosis Date   CAD (coronary artery disease)    a. S/p 3V CABG 2000 with LIMA-LAD, left radial-PDA, SVG-OM1. b. 2014: stent to SVG-OM1; then found to have obstruction distal to LAD anastamosis of LIMA s/p DES.    CHF (congestive heart failure) (HCC)    Diverticular disease    DM (diabetes mellitus) (HCC)    Hemorrhoid    HTN (hypertension)    Hyperlipidemia    Peripheral vascular disease (HCC)    a. R femoropopliteal bypass ~2012 in [place], reportedly shown to be occluded. Now followed by Dr. Allyson Sabal.   Primary myelofibrosis  (HCC)    RBBB      FAMILY HISTORY:   Family History  Problem Relation Age of Onset   Asthma Father    Heart disease Father    Hyperlipidemia Maternal Grandmother    CAD Other        Multiple siblings   Cancer Other        niece had lung cancer   Cancer Paternal Uncle        brain cancer     SOCIAL HISTORY:   Social History   Tobacco Use   Smoking status: Never   Smokeless tobacco: Never  Substance Use Topics   Alcohol use: Yes    Alcohol/week: 0.0 standard drinks of alcohol    Comment: sometimes      ALLERGIES:   Allergies  Allergen Reactions   Other     Other reaction(s): Unknown     CURRENT MEDICATIONS:   Current Outpatient Medications  Medication Sig Dispense Refill   aspirin 81 MG EC tablet Take 81 mg by mouth daily.     atorvastatin (LIPITOR) 40 MG tablet Take 40 mg by mouth at bedtime.     carvedilol (COREG) 6.25 MG tablet TAKE 1 TABLET TWICE DAILY  WITH MEALS (DOSE INCREASE) 180 tablet 3   cilostazol (PLETAL) 100 MG tablet Take 100 mg by mouth 2 (two) times daily. (Patient not taking: Reported on 07/01/2023)     clopidogrel (PLAVIX) 75 MG tablet Take  1 tablet (75 mg total) by mouth daily. 30 tablet 3   Continuous Glucose Sensor (FREESTYLE LIBRE 3 SENSOR) MISC SMARTSIG:Topical Every 10 Days     fluticasone (FLONASE) 50 MCG/ACT nasal spray Place 1 spray into both nostrils daily as needed for allergies.     gabapentin (NEURONTIN) 300 MG capsule Take 300 mg by mouth daily.     hydroxyurea (HYDREA) 500 MG capsule Take 3 capsules in morning. 90 capsule 2   insulin glargine (LANTUS) 100 UNIT/ML injection Inject 10 Units into the skin at bedtime.     insulin lispro (HUMALOG) 100 UNIT/ML injection Inject 10 Units into the skin 2 (two) times daily as needed for high blood sugar.     isosorbide mononitrate (IMDUR) 60 MG 24 hr tablet TAKE 2 TABLETS DAILY 180 tablet 2   Lancets (ONETOUCH DELICA PLUS LANCET33G) MISC Apply topically 2 (two) times daily.      levocetirizine (XYZAL) 5 MG tablet Take 5 mg by mouth every evening.     losartan (COZAAR) 25 MG tablet Take 25 mg by mouth at bedtime.     nitroGLYCERIN (NITROSTAT) 0.4 MG SL tablet Place 1 tablet (0.4 mg total) under the tongue every 5 (five) minutes as needed for chest pain. 75 tablet 1   ONETOUCH VERIO test strip 1 each 2 (two) times daily.     PROAIR HFA 108 (90 BASE) MCG/ACT inhaler Inhale 1 puff into the lungs every 4 (four) hours as needed for wheezing or shortness of breath.      No current facility-administered medications for this visit.    REVIEW OF SYSTEMS:   [X]  denotes positive finding, [ ]  denotes negative finding Cardiac  Comments:  Chest pain or chest pressure:    Shortness of breath upon exertion:    Short of breath when lying flat:    Irregular heart rhythm:        Vascular    Pain in calf, thigh, or hip brought on by ambulation:    Pain in feet at night that wakes you up from your sleep:     Blood clot in your veins:    Leg swelling:         Pulmonary    Oxygen at home:    Productive cough:     Wheezing:         Neurologic    Sudden weakness in arms or legs:     Sudden numbness in arms or legs:     Sudden onset of difficulty speaking or slurred speech:    Temporary loss of vision in one eye:     Problems with dizziness:         Gastrointestinal    Blood in stool:     Vomited blood:         Genitourinary    Burning when urinating:     Blood in urine:        Psychiatric    Major depression:         Hematologic    Bleeding problems:    Problems with blood clotting too easily:        Skin    Rashes or ulcers:        Constitutional    Fever or chills:      PHYSICAL EXAM:   There were no vitals filed for this visit.  GENERAL: The patient is a well-nourished male, in no acute distress. The vital signs are documented above. CARDIAC: There is a regular rate and  rhythm.  VASCULAR: Nonpalpable left pedal pulse, right above-knee  amputation PULMONARY: Non-labored respirations ABDOMEN: Soft and non-tender with normal pitched bowel sounds.  MUSCULOSKELETAL: There are no major deformities or cyanosis. NEUROLOGIC: No focal weakness or paresthesias are detected. SKIN: There are no ulcers or rashes noted. PSYCHIATRIC: The patient has a normal affect.  STUDIES:   I have reviewed the following: ABI/TBIToday's ABIToday's TBIPrevious ABIPrevious TBI  +-------+-----------+-----------+------------+------------+  Right AKA        AKA        AKA         AKA           +-------+-----------+-----------+------------+------------+  Left  0.23       0.0        0.37        0.0           +-------+-----------+-----------+------------+------------+  Absent toe pressure  MEDICAL ISSUES:   PAD: The patient does not have any left leg symptoms.  He does complain of some edema for which I am getting him 15-20 compression socks.  He will follow-up in 1 year with repeat ABIs.  He knows to contact me sooner if he develops a nonhealing wound.    Charlena Cross, MD, FACS [facility] [phone] Pager [phone]

## 2023-08-29 ENCOUNTER — Other Ambulatory Visit: Payer: Self-pay

## 2023-08-29 DIAGNOSIS — I739 Peripheral vascular disease, unspecified: Secondary | ICD-10-CM

## 2023-09-07 ENCOUNTER — Other Ambulatory Visit (HOSPITAL_COMMUNITY): Payer: Self-pay

## 2023-09-24 ENCOUNTER — Telehealth: Payer: Self-pay | Admitting: Podiatry

## 2023-09-24 NOTE — Telephone Encounter (Signed)
Pts insurance company called checking on where the pts diabetic shoes were ordered at and upon checking they are on the ( in route) to our office.  Pt has appt 12/19 with Dr [name] Acres and if possible maybe dispense shoes/inserts. Safestep shows they are arriv9ing tomorrow 12/18

## 2023-09-26 ENCOUNTER — Ambulatory Visit (INDEPENDENT_AMBULATORY_CARE_PROVIDER_SITE_OTHER): Payer: No Typology Code available for payment source | Admitting: Podiatry

## 2023-09-26 DIAGNOSIS — Z89611 Acquired absence of right leg above knee: Secondary | ICD-10-CM

## 2023-09-26 DIAGNOSIS — E1142 Type 2 diabetes mellitus with diabetic polyneuropathy: Secondary | ICD-10-CM

## 2023-09-26 DIAGNOSIS — E1151 Type 2 diabetes mellitus with diabetic peripheral angiopathy without gangrene: Secondary | ICD-10-CM

## 2023-09-26 DIAGNOSIS — L03032 Cellulitis of left toe: Secondary | ICD-10-CM | POA: Diagnosis not present

## 2023-09-26 DIAGNOSIS — I739 Peripheral vascular disease, unspecified: Secondary | ICD-10-CM

## 2023-09-26 DIAGNOSIS — M2142 Flat foot [pes planus] (acquired), left foot: Secondary | ICD-10-CM

## 2023-09-26 DIAGNOSIS — M2141 Flat foot [pes planus] (acquired), right foot: Secondary | ICD-10-CM | POA: Diagnosis not present

## 2023-09-26 DIAGNOSIS — M79675 Pain in left toe(s): Secondary | ICD-10-CM

## 2023-09-26 DIAGNOSIS — L02612 Cutaneous abscess of left foot: Secondary | ICD-10-CM

## 2023-09-26 DIAGNOSIS — B351 Tinea unguium: Secondary | ICD-10-CM

## 2023-09-26 DIAGNOSIS — L02619 Cutaneous abscess of unspecified foot: Secondary | ICD-10-CM | POA: Insufficient documentation

## 2023-09-26 MED ORDER — DOXYCYCLINE HYCLATE 100 MG PO TABS: 100.0000 mg | ORAL_TABLET | Freq: Two times a day (BID) | ORAL | 0 refills | Status: AC

## 2023-09-26 NOTE — Progress Notes (Signed)
   Patient presents today post visit with Dr [name] Acres to pick up diabetic shoes and insoles.  Patient was dispensed 1 pair of diabetic shoes and 3 pairs of foam casted diabetic insoles. Fit was satisfactory. Instructions for break-in and wear was reviewed and a copy was given to the patient.   Re-appointment for regularly scheduled diabetic foot care visits or if they should experience any trouble with the shoes or insoles.     Addison Bailey Cped, CFo, CFm

## 2023-09-26 NOTE — Progress Notes (Signed)
This patient returns to my office for at risk foot care.  This patient requires this care by a professional since this patient will be at risk due to having AK amputation right leg, diabetic neuropathy and pvd. This patient is unable to cut nails himself since the patient cannot reach his nails left foot.  .These nails are painful walking and wearing shoes.  He says he has had multiple doctors evaluate and treat his painful left big toe. 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  Finally he has history of vascilar pathology in addition to right leg amputation.   He says there is still drainage in his big toe left foot for months.This patient presents for at risk foot care today.  General Appearance  Alert, conversant and in no acute stress.  Vascular  Dorsalis pedis and posterior tibial  pulses are absent  left foot..  Capillary return is within normal limits  left foot.  .Cold feet. Left foot.  Neurologic  Senn-Weinstein monofilament wire test within normal limits  left  Muscle power within normal limits left .  Nails Thick disfigured discolored nails with subungual debris  from hallux to fifth toes left foot.. No evidence of bacterial infection or drainage bilaterally.  Orthopedic  No limitations of motion  feet .  No crepitus or effusions noted.  No bony pathology or digital deformities noted.AK Amputation right leg.  Skin  normotropic skin with no porokeratosis noted bilaterally.  No signs of infections or ulcers noted.   Sinus tract medial aspect nail bed left foot.  Onychomycosis   Pain in left toes  Vascular pathology.  Consent was obtained for treatment procedures.   Mechanical debridement of nails 1-5  left performed with a nail nipper.  Filed with dremel without incident.  Examination of left great toe reveals a sinus tract with scant drainage.  C & S performed.  Sinus tract was bandaged with neosporin/DSD.  Prescribe  doxycycline.  RTC prn for absecess and 10 weeks for nail care.   Peroxide toe at home.   Return office visit    3 months                  Told patient to return for periodic foot care and evaluation due to potential at risk complications.   Helane Gunther DPM

## 2023-09-29 LAB — WOUND CULTURE
MICRO NUMBER:: 15872053
SPECIMEN QUALITY:: ADEQUATE

## 2023-10-17 ENCOUNTER — Telehealth: Payer: Self-pay | Admitting: Hematology

## 2023-10-17 NOTE — Telephone Encounter (Signed)
 Patient is aware of rescheduled appointment times/dates for follow up

## 2023-10-31 NOTE — Progress Notes (Signed)
Cardiology Office Note:   Date:  11/01/2023  ID:  Tyler Whitehead, Tyler Whitehead 08-15-1941, MRN [id number] PCP: Default, Provider, [place]   [facility] Providers Cardiologist:  Rollene Rotunda, MD {  History of Present Illness:   Tyler Whitehead is a 83 y.o. male who presents for follow up of CAD/CABG.  I saw him last in 2017.  He moved to [place] and was moving back.   He has CAD he has had CABG and PVD. His last cath [place] suggested that "Medical therapy should be enhanced if the patient has ongoing exertional chest discomfort despite maximal medical management and if he has evidence of inferior ischemia on a perfusion study then repeat PCI of the native right coronary artery could be considered. However, given the patient's track record the right coronary artery is not likely to remain open even if PCI could be accomplished."  He is also managed for lower extremity disease as seen by Dr. Myra Gianotti.  He has had a CVA of unclear etiology had an implantable loop by Dr. Ladona Ridgel.  Cardiac catheterization on 05/11/2020 in [place] demonstrated 100% left main and mid RCA in-stent lesions.  LIMA to the LAD apparently was patent collaterals to the right coronary artery.  He had a patent vein graft to an OM 1 with a patent stent in the vein graft.  He had an occluded vein graft to the RCA.  He had 100% left SFA occlusion.  He did have retrograde cannulation of his left tibial peroneal trunk and had this angioplasty.  Carotid Dopplers demonstrated 40 to 50% stenosis on the left.  Echo demonstrated an EF of 60%.     He was continuing to have chest pain and was found on cath early 2024 to have high grade stenosis of a SVG to the OM and had this stented.  He said that since then he has had less chest pain.  Never really went away and he has continued to use nitroglycerin occasionally.  He thinks this is a stable pattern.  He gets around in his house with a walker.  He uses a motorized vehicle when he travels any distance.  He is  not having any new PND or orthopnea.  He is not having any new palpitations, presyncope or syncope.  ROS: As stated in the HPI and negative for all other systems.  Studies Reviewed:    EKG:   EKG Interpretation Date/Time:  Friday November 01 2023 14:04:08 EST Ventricular Rate:  80 PR Interval:  162 QRS Duration:  120 QT Interval:  380 QTC Calculation: 438 R Axis:   33  Text Interpretation: Sinus rhythm with Premature atrial complexes Right bundle branch block When compared with ECG of 03-Jan-2023 10:42, No significant change since last tracing Confirmed by Rollene Rotunda ([id number]) on 11/01/2023 2:25:27 PM     Risk Assessment/Calculations:              Physical Exam:   VS:  BP 136/64   Pulse 80   Ht 5\' 7"  (1.702 m)   Wt 150 lb (68 kg)   SpO2 97%   BMI 23.49 kg/m    Wt Readings from Last 3 Encounters:  11/01/23 150 lb (68 kg)  08/12/23 159 lb (72.1 kg)  07/01/23 159 lb (72.1 kg)     GEN: Well nourished, well developed in no acute distress NECK: No JVD; No carotid bruits CARDIAC: RRR, no murmurs, rubs, gallops RESPIRATORY:  Clear to auscultation without rales, wheezing or rhonchi  ABDOMEN: Soft,  non-tender, non-distended EXTREMITIES:  No edema; No deformity, right lower extremity amputation  ASSESSMENT AND PLAN:   CAD: The patient has no new sypmtoms.  No further cardiovascular testing is indicated.  We will continue with aggressive risk reduction and meds as listed.  Hypertension: The blood pressure is at target.  No change in therapy.    Hyperlipidemia: LDL was 64.  He will continue the meds as listed.   Carotid artery stenosis/PAD: He is followed by [facility].   Type 2 diabetes: A1c was 6.6 which is lower than previous.   History of CVA:   He remains on aspirin and Plavix because of previous CVA.   Follow up I will see him in 1 year.  Signed, Rollene Rotunda, MD

## 2023-11-01 ENCOUNTER — Ambulatory Visit: Payer: No Typology Code available for payment source | Attending: Cardiology | Admitting: Cardiology

## 2023-11-01 ENCOUNTER — Encounter: Payer: Self-pay | Admitting: Cardiology

## 2023-11-01 VITALS — BP 136/64 | HR 80 | Ht 67.0 in | Wt 150.0 lb

## 2023-11-01 DIAGNOSIS — I25118 Atherosclerotic heart disease of native coronary artery with other forms of angina pectoris: Secondary | ICD-10-CM

## 2023-11-01 DIAGNOSIS — E785 Hyperlipidemia, unspecified: Secondary | ICD-10-CM

## 2023-11-01 DIAGNOSIS — I10 Essential (primary) hypertension: Secondary | ICD-10-CM | POA: Diagnosis not present

## 2023-11-01 NOTE — Patient Instructions (Signed)
,  Medication Instructions:  No changes. *If you need a refill on your cardiac medications before your next appointment, please call your pharmacy*   Follow-Up: At [facility], you and your health needs are our priority.  As part of our continuing mission to provide you with exceptional heart care, we have created designated Provider Care Teams.  These Care Teams include your primary Cardiologist (physician) and Advanced Practice Providers (APPs -  Physician Assistants and Nurse Practitioners) who all work together to provide you with the care you need, when you need it.  We recommend signing up for the patient portal called "MyChart".  Sign up information is provided on this After Visit Summary.  MyChart is used to connect with patients for Virtual Visits (Telemedicine).  Patients are able to view lab/test results, encounter notes, upcoming appointments, etc.  Non-urgent messages can be sent to your provider as well.   To learn more about what you can do with MyChart, go to [email or web address].    Your next appointment:   1 year(s)  Provider:   Rollene Rotunda, MD

## 2023-11-07 ENCOUNTER — Other Ambulatory Visit: Payer: No Typology Code available for payment source

## 2023-11-07 ENCOUNTER — Ambulatory Visit: Payer: No Typology Code available for payment source | Admitting: Hematology

## 2023-11-15 ENCOUNTER — Other Ambulatory Visit: Payer: Self-pay

## 2023-11-15 DIAGNOSIS — D7581 Myelofibrosis: Secondary | ICD-10-CM

## 2023-11-17 NOTE — Assessment & Plan Note (Signed)
 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 Previously discussed this is not curable but treatable, risk of developing acute leukemia or aplastic anemia later on. -he is not a good candidate for stem cell transplant due to his advanced age and multiple medical comorbidities. -he tried Jakafi for 4 months in 2014, could not tolerate due to his anemia and fatigue. -He is aware of the importance of continued Hydrea  and normalized his blood counts to prevent further stroke or thrombosis.  -he continues hydrea , he is taking 1500mg  daily nowl, blood counts stable, will continue at same dose

## 2023-11-18 ENCOUNTER — Other Ambulatory Visit: Payer: Self-pay

## 2023-11-18 ENCOUNTER — Inpatient Hospital Stay (HOSPITAL_BASED_OUTPATIENT_CLINIC_OR_DEPARTMENT_OTHER): Payer: No Typology Code available for payment source | Admitting: Hematology

## 2023-11-18 ENCOUNTER — Inpatient Hospital Stay: Payer: No Typology Code available for payment source | Attending: Hematology

## 2023-11-18 ENCOUNTER — Other Ambulatory Visit: Payer: Self-pay | Admitting: Cardiology

## 2023-11-18 VITALS — BP 128/64 | HR 82 | Temp 97.9°F | Resp 16 | Ht 67.0 in | Wt 156.4 lb

## 2023-11-18 DIAGNOSIS — D7581 Myelofibrosis: Secondary | ICD-10-CM

## 2023-11-18 DIAGNOSIS — D471 Chronic myeloproliferative disease: Secondary | ICD-10-CM | POA: Insufficient documentation

## 2023-11-18 DIAGNOSIS — Z7964 Long term (current) use of myelosuppressive agent: Secondary | ICD-10-CM | POA: Diagnosis not present

## 2023-11-18 LAB — CBC WITH DIFFERENTIAL (CANCER CENTER ONLY)
Abs Immature Granulocytes: 0.69 10*3/uL — ABNORMAL HIGH (ref 0.00–0.07)
Basophils Absolute: 0.2 10*3/uL — ABNORMAL HIGH (ref 0.0–0.1)
Basophils Relative: 1 %
Eosinophils Absolute: 0.4 10*3/uL (ref 0.0–0.5)
Eosinophils Relative: 2 %
HCT: 35.8 % — ABNORMAL LOW (ref 39.0–52.0)
Hemoglobin: 11.1 g/dL — ABNORMAL LOW (ref 13.0–17.0)
Immature Granulocytes: 3 %
Lymphocytes Relative: 5 %
Lymphs Abs: 1.1 10*3/uL (ref 0.7–4.0)
MCH: 27.7 pg (ref 26.0–34.0)
MCHC: 31 g/dL (ref 30.0–36.0)
MCV: 89.3 fL (ref 80.0–100.0)
Monocytes Absolute: 1.1 10*3/uL — ABNORMAL HIGH (ref 0.1–1.0)
Monocytes Relative: 4 %
Neutro Abs: 20.2 10*3/uL — ABNORMAL HIGH (ref 1.7–7.7)
Neutrophils Relative %: 85 %
Platelet Count: 345 10*3/uL (ref 150–400)
RBC: 4.01 MIL/uL — ABNORMAL LOW (ref 4.22–5.81)
RDW: 21.1 % — ABNORMAL HIGH (ref 11.5–15.5)
WBC Count: 23.6 10*3/uL — ABNORMAL HIGH (ref 4.0–10.5)
nRBC: 0.7 % — ABNORMAL HIGH (ref 0.0–0.2)

## 2023-11-18 LAB — CMP (CANCER CENTER ONLY)
ALT: 5 U/L (ref 0–44)
AST: 11 U/L — ABNORMAL LOW (ref 15–41)
Albumin: 3.7 g/dL (ref 3.5–5.0)
Alkaline Phosphatase: 116 U/L (ref 38–126)
Anion gap: 3 — ABNORMAL LOW (ref 5–15)
BUN: 15 mg/dL (ref 8–23)
CO2: 31 mmol/L (ref 22–32)
Calcium: 8.7 mg/dL — ABNORMAL LOW (ref 8.9–10.3)
Chloride: 105 mmol/L (ref 98–111)
Creatinine: 1.12 mg/dL (ref 0.61–1.24)
GFR, Estimated: >60 mL/min (ref >60)
Glucose, Bld: 231 mg/dL — ABNORMAL HIGH (ref 70–99)
Potassium: 4.1 mmol/L (ref 3.5–5.1)
Sodium: 139 mmol/L (ref 135–145)
Total Bilirubin: 0.6 mg/dL (ref 0.0–1.2)
Total Protein: 5.8 g/dL — ABNORMAL LOW (ref 6.5–8.1)

## 2023-11-18 NOTE — Progress Notes (Signed)
 [facility]   Telephone:(336) [phone] Fax:(336) [phone]   Clinic Follow up Note   Patient Care Team: Default, Provider, MD as PCP - General Eilleen Grates, MD as PCP - Cardiology (Cardiology) Maximo Spar Aviva Lemmings, MD as Consulting Physician (Cardiology) Eilleen Grates, MD as Consulting Physician (Cardiology) Jhonny Moss, MD as Consulting Physician (Neurology) Sonja [place], MD as Attending Physician (Hematology and Oncology)  Date of Service:  11/18/2023  CHIEF COMPLAINT: f/u of MPN  CURRENT THERAPY:  Hydrea  1500 mg daily  Oncology History   Myelofibrosis (HCC) JAK2 mutation (+), DIPSS 2, 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 Previously discussed this is not curable but treatable, risk of developing acute leukemia or aplastic anemia later on. -he is not a good candidate for stem cell transplant due to his advanced age and multiple medical comorbidities. -he tried Jakafi for 4 months in 2014, could not tolerate due to his anemia and fatigue. -He is aware of the importance of continued Hydrea  and normalized his blood counts to prevent further stroke or thrombosis.  -he continues hydrea , he is taking 1500mg  daily nowl, blood counts stable, will continue at same dose    Assessment and Plan    Myeloproliferative Neoplasm (MPN) Follow-up for MPN. Blood counts are well-managed with a slightly elevated white count. Hemoglobin is 11.1, and platelet count is 345. He is compliant with hydroxyurea  (Hydrea ), taking three pills at night for ease of remembering. Discussed the importance of adherence to medication regimen to prevent disease progression and potential complications. - Continue hydroxyurea  as prescribed - Monitor labs every three months - Schedule follow-up in six months  Skin Lesions and Hives Reports recurrent hives and skin lesions resembling boils, primarily on arms, back, shoulders, and chest. Lesions are pruritic and sometimes  become infected. Uses alcohol and warm compresses for management. Discussed the importance of maintaining hygiene and using topical antibiotics to prevent infection. - Use warm compresses for boils - Apply topical antibiotics to affected areas - Maintain hygiene with soap and water - Continue using alcohol for cleaning lesions  Stroke Stroke in 2015. No new neurological symptoms reported. Engages in cognitive exercises such as word search puzzles to aid memory. - Continue current cognitive exercises  General Health Maintenance No new systemic symptoms reported. Previous toe wound has healed. - Report any new symptoms or concerns  Plan -Lab reviewed, overall stable, platelet normal today.  Will continue Hydrea  at the same dose - Schedule follow-up appointment in six months - Monitor labs every three months.         SUMMARY OF ONCOLOGIC HISTORY: Oncology History  Myelofibrosis (HCC)  12/26/2012 Bone Marrow Biopsy   Showed hypercellularity with reticulum fibrosis, JAK-2 mutation positive, BCR/ABL negative, 46 XY in 20 metaphase, and Intermediate-1 ,DIPSS of 2. (WBC < 25; Hbg > 10;    01/02/2013 Imaging   US  abdomen complete revealed Mild splenogmealy (13.3 cm) and a 2.2 x 1.7 x 1.8 cm left renal cyst   01/15/2013 Pathology Results   Integrated hematopathology report.  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    03/17/2013 Imaging   CT Chest: Crowded peribronchial markings noted, associated with linear densities involving the left base, probably representing areas of platelike atelectasis/scarring.  No mass or infiltrate seen.    03/19/2013 - 07/20/2013 Chemotherapy   Started Jakafi 20 mg bid. Dose reduced due to anemia.  Stopped due to increasing fatigue while  on jakafi.    04/28/2013 - 05/01/2013 Hospital Admission   Admitted to [facility] by Dr. Sharren Decree, his cardiologist, due to the fact that he had midsternal chest pain.  He had a coronary stent placed this admission.    05/28/2013 - 05/29/2013 Hospital Admission   Admitted to [facility] and received 3 units of blood (Dr. Marylin So note).    06/10/2013 Surgery   Cololonoscopy due to GIB.  C/w diverticular disease and hemorrhoids but no active bleeding.    10/05/2013 -  Chemotherapy   Started Hydrea  500 mg daily.   03/19/2014 -  Chemotherapy   Hydrea  increased to 500 mg bid wbc 17, hb 17, hct 54, plt 437   06/29/2014 Procedure   cbc shows wbc 18.8 hb 18.7 hct 58.9 plt 456. ?compliance w hydrea  as MCV not elevated. symptomatic start TP weekly x 4   07/28/2014 Procedure   TP every 2 weeks if hct > 45, changed to monthly in April 2016      Discussed the use of AI scribe software for clinical note transcription with the patient, who gave verbal consent to proceed.  History of Present Illness   The patient, an 83 year old with a history of myeloproliferative neoplasm (MPN) and a stroke 10 years ago, presents for a routine follow-up. He reports no new symptoms or concerns related to his MPN. He continues to take three pills of hydrea  at night, which he finds easier to remember than splitting the dose. He has a sufficient supply of medication at home.  The patient also mentions a past wound on his big toe, which has since healed. He describes a recurrent skin issue, presenting as hives that sometimes develop into boils when scratched. He manages this with alcohol and showers, which provide temporary relief.  The patient also mentions a past stroke, which has left him with some memory issues. He uses word search puzzles as a form of cognitive exercise.         All other systems were reviewed with the patient and are negative.  MEDICAL HISTORY:  Past Medical History:  Diagnosis Date   CAD (coronary artery disease)    a. S/p 3V CABG 2000 with LIMA-LAD,  left radial-PDA, SVG-OM1. b. 2014: stent to SVG-OM1; then found to have obstruction distal to LAD anastamosis of LIMA s/p DES.    CHF (congestive heart failure) (HCC)    Diverticular disease    DM (diabetes mellitus) (HCC)    Hemorrhoid    HTN (hypertension)    Hyperlipidemia    Peripheral vascular disease (HCC)    a. R femoropopliteal bypass ~2012 in [place], reportedly shown to be occluded. Now followed by Dr. Katheryne Pane.   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CC)    RBBB     SURGICAL HISTORY: Past Surgical History:  Procedure Laterality Date   ABDOMINAL ANGIOGRAM N/A 01/18/2015   Procedure: ABDOMINAL ANGIOGRAM;  Surgeon: Margherita Shell, MD;  Location: [facility] CATH LAB;  Service: Cardiovascular;  Laterality: N/A;   ABDOMINAL AORTAGRAM N/A 09/21/2014   Procedure: ABDOMINAL Tommi Fraise;  Surgeon: Richrd Char, MD;  Location: [facility] CATH LAB;  Service: Cardiovascular;  Laterality: N/A;   ABDOMINAL AORTOGRAM W/LOWER EXTREMITY N/A 03/28/2021   Procedure: ABDOMINAL AORTOGRAM W/LOWER EXTREMITY;  Surgeon: Margherita Shell, MD;  Location: [facility] INVASIVE CV LAB;  Service: Cardiovascular;  Laterality: N/A;   CARDIAC CATHETERIZATION  May 2014   CORONARY ARTERY BYPASS GRAFT  2000   LM occluded, LIMA to LAD patent but distal  native LAD disease, saphenous vein bypass graft to OM with 95% stenosis in the midportion, right coronary artery occluded, radial artery graft to right coronary artery patent but was diffusely small. Marginal graft stented. 7 2014. Of note he had multiple previous stents in the right coronary artery. The distal LAD was treated with angioplasty. I   CORONARY STENT INTERVENTION N/A 01/03/2023   Procedure: CORONARY STENT INTERVENTION;  Surgeon: Swaziland, Peter M, MD;  Location: [facility] INVASIVE CV LAB;  Service: Cardiovascular;  Laterality: N/A;   LEFT HEART CATH AND CORS/GRAFTS ANGIOGRAPHY N/A 01/03/2023   Procedure: LEFT HEART CATH AND CORS/GRAFTS ANGIOGRAPHY;  Surgeon: Swaziland, Peter M, MD;  Location: [facility] INVASIVE CV LAB;   Service: Cardiovascular;  Laterality: N/A;   LOOP RECORDER IMPLANT N/A 07/12/2014   Procedure: LOOP RECORDER IMPLANT;  Surgeon: Tammie Fall, MD;  Location: [facility] CATH LAB;  Service: Cardiovascular;  Laterality: N/A;   LOWER EXTREMITY ANGIOGRAM  09/21/2014   Procedure: LOWER EXTREMITY ANGIOGRAM;  Surgeon: Richrd Char, MD;  Location: [facility] CATH LAB;  Service: Cardiovascular;;   PERCUTANEOUS CORONARY STENT INTERVENTION (PCI-S)  April 27 2013   right leg amputation  2017   TEE WITHOUT CARDIOVERSION N/A 07/12/2014   Procedure: TRANSESOPHAGEAL ECHOCARDIOGRAM (TEE);  Surgeon: Elmyra Haggard, MD;  Location: [facility] ENDOSCOPY;  Service: Cardiovascular;  Laterality: N/A;   VEIN SURGERY      I have reviewed the social history and family history with the patient and they are unchanged from previous note.  ALLERGIES:  is allergic to other.  MEDICATIONS:  Current Outpatient Medications  Medication Sig Dispense Refill   aspirin  81 MG EC tablet Take 81 mg by mouth daily.     atorvastatin  (LIPITOR) 40 MG tablet Take 40 mg by mouth at bedtime.     carvedilol  (COREG ) 6.25 MG tablet TAKE 1 TABLET TWICE DAILY  WITH MEALS (DOSE INCREASE) 180 tablet 3   cilostazol  (PLETAL ) 100 MG tablet Take 100 mg by mouth 2 (two) times daily.     clopidogrel  (PLAVIX ) 75 MG tablet Take 1 tablet (75 mg total) by mouth daily. 30 tablet 3   Continuous Glucose Sensor (FREESTYLE LIBRE 3 SENSOR) MISC SMARTSIG:Topical Every 10 Days     doxycycline  (VIBRA -TABS) 100 MG tablet Take 1 tablet (100 mg total) by mouth 2 (two) times daily. 20 tablet 0   fluticasone  (FLONASE ) 50 MCG/ACT nasal spray Place 1 spray into both nostrils daily as needed for allergies.     gabapentin (NEURONTIN) 300 MG capsule Take 300 mg by mouth daily.     hydroxyurea  (HYDREA ) 500 MG capsule Take 3 capsules in morning. 90 capsule 2   insulin  glargine (LANTUS ) 100 UNIT/ML injection Inject 10 Units into the skin at bedtime.     insulin  lispro (HUMALOG) 100 UNIT/ML  injection Inject 10 Units into the skin 2 (two) times daily as needed for high blood sugar.     isosorbide  mononitrate (IMDUR ) 60 MG 24 hr tablet TAKE 2 TABLETS DAILY 180 tablet 2   Lancets (ONETOUCH DELICA PLUS LANCET33G) MISC Apply topically 2 (two) times daily.     levocetirizine (XYZAL) 5 MG tablet Take 5 mg by mouth every evening.     losartan  (COZAAR ) 25 MG tablet Take 25 mg by mouth at bedtime.     nitroGLYCERIN  (NITROSTAT ) 0.4 MG SL tablet Place 1 tablet (0.4 mg total) under the tongue every 5 (five) minutes as needed for chest pain. 75 tablet 1   ONETOUCH VERIO test strip 1 each 2 (  two) times daily.     PROAIR  HFA 108 (90 BASE) MCG/ACT inhaler Inhale 1 puff into the lungs every 4 (four) hours as needed for wheezing or shortness of breath.      No current facility-administered medications for this visit.    PHYSICAL EXAMINATION: ECOG PERFORMANCE STATUS: 2 - Symptomatic, <50% confined to bed  Vitals:   11/18/23 1322  BP: 128/64  Pulse: 82  Resp: 16  Temp: 97.9 F (36.6 C)  SpO2: 100%   Wt Readings from Last 3 Encounters:  11/18/23 70.9 kg  11/01/23 68 kg  08/12/23 72.1 kg     GENERAL:alert, no distress and comfortable SKIN: skin color, texture, turgor are normal, scattered healed rash with hyperpigmentation EYES: normal, Conjunctiva are pink and non-injected, sclera clear Musculoskeletal:no cyanosis of digits and no clubbing  NEURO: alert & oriented x 3 with fluent speech, no focal motor/sensory deficits   LABORATORY DATA:  I have reviewed the data as listed    Latest Ref Rng & Units 11/18/2023    1:04 PM 08/02/2023   11:38 AM 05/06/2023   12:07 PM  CBC  WBC 4.0 - 10.5 K/uL 23.6  17.4  17.0   Hemoglobin 13.0 - 17.0 g/dL 16.1  09.6  04.5   Hematocrit 39.0 - 52.0 % 35.8  32.7  35.0   Platelets 150 - 400 K/uL 345  481  441         Latest Ref Rng & Units 11/18/2023    1:04 PM 08/02/2023   11:38 AM 05/06/2023   12:07 PM  CMP  Glucose 70 - 99 mg/dL 409  811  914    BUN 8 - 23 mg/dL 15  23  21    Creatinine 0.61 - 1.24 mg/dL 7.82  9.56  2.13   Sodium 135 - 145 mmol/L 139  138  139   Potassium 3.5 - 5.1 mmol/L 4.1  4.2  4.4   Chloride 98 - 111 mmol/L 105  105  106   CO2 22 - 32 mmol/L 31  30  28    Calcium  8.9 - 10.3 mg/dL 8.7  8.5  8.8   Total Protein 6.5 - 8.1 g/dL 5.8  5.7  5.6   Total Bilirubin 0.0 - 1.2 mg/dL 0.6  0.5  0.4   Alkaline Phos 38 - 126 U/L 116  103  99   AST 15 - 41 U/L 11  10  12    ALT 0 - 44 U/L 5  5  8        RADIOGRAPHIC STUDIES: I have personally reviewed the radiological images as listed and agreed with the findings in the report. No results found.    No orders of the defined types were placed in this encounter.  All questions were answered. The patient knows to call the clinic with any problems, questions or concerns. No barriers to learning was detected. The total time spent in the appointment was 15 minutes.     Sonja [place], MD 11/18/2023

## 2023-12-05 DIAGNOSIS — E1140 Type 2 diabetes mellitus with diabetic neuropathy, unspecified: Secondary | ICD-10-CM | POA: Diagnosis not present

## 2023-12-05 DIAGNOSIS — F341 Dysthymic disorder: Secondary | ICD-10-CM | POA: Diagnosis not present

## 2023-12-05 DIAGNOSIS — D471 Chronic myeloproliferative disease: Secondary | ICD-10-CM | POA: Diagnosis not present

## 2023-12-05 DIAGNOSIS — I739 Peripheral vascular disease, unspecified: Secondary | ICD-10-CM | POA: Diagnosis not present

## 2023-12-05 DIAGNOSIS — E785 Hyperlipidemia, unspecified: Secondary | ICD-10-CM | POA: Diagnosis not present

## 2023-12-05 DIAGNOSIS — I25709 Atherosclerosis of coronary artery bypass graft(s), unspecified, with unspecified angina pectoris: Secondary | ICD-10-CM | POA: Diagnosis not present

## 2023-12-05 DIAGNOSIS — R569 Unspecified convulsions: Secondary | ICD-10-CM | POA: Diagnosis not present

## 2023-12-05 DIAGNOSIS — Z951 Presence of aortocoronary bypass graft: Secondary | ICD-10-CM | POA: Diagnosis not present

## 2023-12-05 DIAGNOSIS — Z89611 Acquired absence of right leg above knee: Secondary | ICD-10-CM | POA: Diagnosis not present

## 2023-12-05 DIAGNOSIS — G8190 Hemiplegia, unspecified affecting unspecified side: Secondary | ICD-10-CM | POA: Diagnosis not present

## 2023-12-05 DIAGNOSIS — N182 Chronic kidney disease, stage 2 (mild): Secondary | ICD-10-CM | POA: Diagnosis not present

## 2023-12-05 DIAGNOSIS — I10 Essential (primary) hypertension: Secondary | ICD-10-CM | POA: Diagnosis not present

## 2023-12-05 DIAGNOSIS — Z0000 Encounter for general adult medical examination without abnormal findings: Secondary | ICD-10-CM | POA: Diagnosis not present

## 2023-12-05 DIAGNOSIS — Z1331 Encounter for screening for depression: Secondary | ICD-10-CM | POA: Diagnosis not present

## 2023-12-06 DIAGNOSIS — E119 Type 2 diabetes mellitus without complications: Secondary | ICD-10-CM | POA: Diagnosis not present

## 2023-12-25 ENCOUNTER — Encounter: Payer: Self-pay | Admitting: Physical Therapy

## 2023-12-25 ENCOUNTER — Telehealth: Payer: Self-pay | Admitting: Cardiology

## 2023-12-25 ENCOUNTER — Ambulatory Visit: Attending: Internal Medicine | Admitting: Physical Therapy

## 2023-12-25 VITALS — BP 118/38 | HR 67

## 2023-12-25 DIAGNOSIS — R2681 Unsteadiness on feet: Secondary | ICD-10-CM | POA: Insufficient documentation

## 2023-12-25 DIAGNOSIS — M6281 Muscle weakness (generalized): Secondary | ICD-10-CM | POA: Insufficient documentation

## 2023-12-25 DIAGNOSIS — R2689 Other abnormalities of gait and mobility: Secondary | ICD-10-CM | POA: Insufficient documentation

## 2023-12-25 NOTE — Telephone Encounter (Signed)
 Returned call to [place] PT with [facility] Dr.Hochrein's advice given.Spoke to patient Dr.Hochrein's advice given.Stated he rechecked B/P 1 hour ago 106/62 pulse 73.Advised continue all medications.

## 2023-12-25 NOTE — Telephone Encounter (Signed)
 Pt c/o BP issue: STAT if pt c/o blurred vision, one-sided weakness or slurred speech.  STAT if BP is GREATER than 180/120 TODAY.  STAT if BP is LESS than 90/60 and SYMPTOMATIC TODAY  1. What is your BP concern? Concerned that BP is too low  2. Have you taken any BP medication today?No - takes at night  3. What are your last 5 BP readings?118/38  4. Are you having any other symptoms (ex. Dizziness, headache, blurred vision, passed out)? No   Pt currently at PT and therapist would like a c/b regarding Hypotension

## 2023-12-25 NOTE — Telephone Encounter (Signed)
 Spoke to [place] PT at [facility] she was concerned patient's B/P this afternoon 118/38 pulse 67.Stated patient feels good no complaints.Advised he needs to stay well hydrated.I will send message to Dr.Hochrein for advice.

## 2023-12-25 NOTE — Therapy (Addendum)
 OUTPATIENT PHYSICAL THERAPY NEURO EVALUATION- ARRIVED NO CHARGE   Patient Name: Tyler Whitehead MRN: [id number] DOB:[birth year], 83 y.o., male Today's Date: 12/25/2023   PCP: Georgann Housekeeper, MD REFERRING PROVIDER: Georgann Housekeeper, MD  END OF SESSION:  PT End of Session - 12/25/23 1237     Visit Number 1    Authorization Type [employer]    PT Start Time 1233    PT Stop Time 1330    PT Time Calculation (min) 57 min    Activity Tolerance Treatment limited secondary to medical complications (Comment)   Diastolic hypotension   Behavior During Therapy [facility] for tasks assessed/performed             Past Medical History:  Diagnosis Date   CAD (coronary artery disease)    a. S/p 3V CABG 2000 with LIMA-LAD, left radial-PDA, SVG-OM1. b. 2014: stent to SVG-OM1; then found to have obstruction distal to LAD anastamosis of LIMA s/p DES.    CHF (congestive heart failure) (HCC)    Diverticular disease    DM (diabetes mellitus) (HCC)    Hemorrhoid    HTN (hypertension)    Hyperlipidemia    Peripheral vascular disease (HCC)    a. R femoropopliteal bypass ~2012 in [place], reportedly shown to be occluded. Now followed by Dr. Allyson Sabal.   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CC)    RBBB    Past Surgical History:  Procedure Laterality Date   ABDOMINAL ANGIOGRAM N/A 01/18/2015   Procedure: ABDOMINAL ANGIOGRAM;  Surgeon: Nada Libman, MD;  Location: [facility] CATH LAB;  Service: Cardiovascular;  Laterality: N/A;   ABDOMINAL AORTAGRAM N/A 09/21/2014   Procedure: ABDOMINAL Ronny Flurry;  Surgeon: Sherren Kerns, MD;  Location: [facility] CATH LAB;  Service: Cardiovascular;  Laterality: N/A;   ABDOMINAL AORTOGRAM W/LOWER EXTREMITY N/A 03/28/2021   Procedure: ABDOMINAL AORTOGRAM W/LOWER EXTREMITY;  Surgeon: Nada Libman, MD;  Location: [facility] INVASIVE CV LAB;  Service: Cardiovascular;  Laterality: N/A;   CARDIAC CATHETERIZATION  May 2014   CORONARY ARTERY BYPASS GRAFT  2000   LM occluded, LIMA to LAD patent but distal native LAD  disease, saphenous vein bypass graft to OM with 95% stenosis in the midportion, right coronary artery occluded, radial artery graft to right coronary artery patent but was diffusely small. Marginal graft stented. 7 2014. Of note he had multiple previous stents in the right coronary artery. The distal LAD was treated with angioplasty. I   CORONARY STENT INTERVENTION N/A 01/03/2023   Procedure: CORONARY STENT INTERVENTION;  Surgeon: Swaziland, Peter M, MD;  Location: [facility] INVASIVE CV LAB;  Service: Cardiovascular;  Laterality: N/A;   LEFT HEART CATH AND CORS/GRAFTS ANGIOGRAPHY N/A 01/03/2023   Procedure: LEFT HEART CATH AND CORS/GRAFTS ANGIOGRAPHY;  Surgeon: Swaziland, Peter M, MD;  Location: [facility] INVASIVE CV LAB;  Service: Cardiovascular;  Laterality: N/A;   LOOP RECORDER IMPLANT N/A 07/12/2014   Procedure: LOOP RECORDER IMPLANT;  Surgeon: Marinus Maw, MD;  Location: [facility] CATH LAB;  Service: Cardiovascular;  Laterality: N/A;   LOWER EXTREMITY ANGIOGRAM  09/21/2014   Procedure: LOWER EXTREMITY ANGIOGRAM;  Surgeon: Sherren Kerns, MD;  Location: [facility] CATH LAB;  Service: Cardiovascular;;   PERCUTANEOUS CORONARY STENT INTERVENTION (PCI-S)  April 27 2013   right leg amputation  2017   TEE WITHOUT CARDIOVERSION N/A 07/12/2014   Procedure: TRANSESOPHAGEAL ECHOCARDIOGRAM (TEE);  Surgeon: Pricilla Riffle, MD;  Location: [facility] ENDOSCOPY;  Service: Cardiovascular;  Laterality: N/A;   VEIN SURGERY     Patient Active Problem List  Diagnosis Date Noted   Cellulitis and abscess of toe 09/26/2023   Stenosis of carotid artery 05/08/2023   Angina pectoris (HCC) 01/03/2023   Gait abnormality 02/20/2022   Memory deficit 02/20/2022   Right hemiparesis (HCC) 02/20/2022   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) 09/08/2021   Atherosclerosis of native artery of left lower extremity with rest pain (HCC) 09/08/2021   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 (HCC) 11/14/2020   Allergic rhinitis 11/14/2020    Chronic constipation 11/14/2020   Depression 11/14/2020   Diabetic peripheral neuropathy associated with type 2 diabetes mellitus (HCC) 11/14/2020   Dysphagia 11/14/2020   Hyperlipidemia 11/14/2020   Leukocytosis 11/14/2020   Nausea and vomiting 11/14/2020   Presence of aortocoronary bypass graft 11/14/2020   Pure hypercholesterolemia 11/14/2020   Vitamin D deficiency 11/14/2020   Cryptogenic stroke (HCC) 07/20/2015   History of stroke 07/06/2015   Transient vision disturbance, right 07/06/2015   Atherosclerosis of native arteries of extremity with intermittent claudication (HCC) 09/27/2014   Localization-related symptomatic epilepsy and epileptic syndromes with complex partial seizures, not intractable, without status epilepticus (HCC) 08/04/2014   Occipital stroke (HCC) 08/04/2014   Cerebral thrombosis with cerebral infarction (HCC) 07/10/2014   New onset seizure (HCC) 07/09/2014   Diabetic hyperosmolar non-ketotic state (HCC) 07/09/2014   Essential hypertension 07/09/2014   Polycythemia vera (HCC) 07/09/2014   Seizure (HCC) 07/09/2014   Visual field defect 07/09/2014   Headache 06/30/2014   HTN (hypertension) 11/23/2013   Dyslipidemia 11/23/2013   Myelofibrosis (HCC) 11/22/2013   Diverticular disease 11/22/2013   Hemorrhoids 11/22/2013   S/P CABG x 3 11/22/2013   CAD (coronary artery disease) 11/22/2013   Peripheral vascular disease (HCC) 11/22/2013   DM2 (diabetes mellitus, type 2) (HCC) 11/22/2013   Renal cyst, left 11/22/2013   Splenomegaly 11/22/2013    ONSET DATE: 12/05/2023 (referral)   REFERRING DIAG: R26.9 (ICD-10-CM) - Unspecified abnormalities of gait and mobility (referral)   THERAPY DIAG:  Muscle weakness (generalized)  Unsteadiness on feet  Other abnormalities of gait and mobility  Rationale for Evaluation and Treatment: Rehabilitation  SUBJECTIVE:                                                                                                                                                                                              SUBJECTIVE STATEMENT: Pt presents in [place]. Reports he has had issues with his mobility since his stroke 10 year ago. States his hand will slip off the RW at home and place him at a risk of falls. Is a R AKA, had his leg amputated in 2017. Has  memory issues. Had a fall about a week ago in the kitchen. Has pain in his L foot, states he had an infection (?) but "cured it" with peroxide. States he is "losing his retinas" due to diabetes.    Pt accompanied by: self  PERTINENT HISTORY: CAD, CHF, DM, HTN, PVD, myelofibrosis, previous CVA, R AKA (2017)   PAIN:  Are you having pain? No  PRECAUTIONS: Fall and impaired vision   RED FLAGS: None   WEIGHT BEARING RESTRICTIONS: No  FALLS: Has patient fallen in last 6 months? Yes. Number of falls 5-6  LIVING ENVIRONMENT: Lives with: lives alone Lives in: House/apartment Stairs: No Has following equipment at home: Single point cane, Environmental consultant - 2 wheeled, shower chair, Grab bars, and [place]  PLOF: Requires assistive device for independence  PATIENT GOALS: "To get better standing and walking"   OBJECTIVE:  Note: Objective measures were completed at Evaluation unless otherwise noted.  DIAGNOSTIC FINDINGS: CT from 11/2015   IMPRESSION: No acute intracranial findings. Normal for age cerebral volume. Remote LEFT occipital CVA.  COGNITION: Overall cognitive status: Difficulty to assess due to: no family present and pt reports he has cognitive issues   SENSATION: Pt w/peripheral neuropathy    EDEMA: Chronic swelling in LLE    POSTURE: increased thoracic kyphosis, flexed trunk , and weight shift right  LOWER EXTREMITY ROM:     Active  Right Eval Left Eval  Hip flexion    Hip extension    Hip abduction    Hip adduction    Hip internal rotation    Hip external rotation    Knee flexion    Knee extension    Ankle dorsiflexion    Ankle  plantarflexion    Ankle inversion    Ankle eversion     (Blank rows = not tested)  LOWER EXTREMITY MMT:    MMT Right Eval Left Eval  Hip flexion    Hip extension    Hip abduction    Hip adduction    Hip internal rotation    Hip external rotation    Knee flexion    Knee extension    Ankle dorsiflexion    Ankle plantarflexion    Ankle inversion    Ankle eversion    (Blank rows = not tested)  BED MOBILITY:  Independent per pt    VITALS  Vitals:   12/25/23 1252 12/25/23 1255  BP: (!) 120/36 (!) 118/38  Pulse: 67                                                                                                                                  TREATMENT:   Assessed vitals (See above) and diastolic BP very low (taken w/machine and manually). Pt asymptomatic but concerned over low reading. Pt reports he did not take his meds this morning, as he typically takes them at night. Pt has drank coffee today but nothing else and did  not eat. Contacted Dr. Jenene Slicker office to inform them of BP readings and inquire what to do as pt not symptomatic. Pt's cardiology office encouraged pt to drink fluids and eat well. Pt's cardiologist out of office but nurse to relay message to him and pt can expect a call back. Pt left on transportation van.  Rescheduled eval due to low BP.     Jill Alexanders Robi Mitter, PT, DPT 12/25/2023, 2:10 PM

## 2024-01-01 ENCOUNTER — Encounter: Payer: Self-pay | Admitting: Podiatry

## 2024-01-01 ENCOUNTER — Ambulatory Visit: Payer: No Typology Code available for payment source | Admitting: Podiatry

## 2024-01-01 DIAGNOSIS — M79675 Pain in left toe(s): Secondary | ICD-10-CM | POA: Diagnosis not present

## 2024-01-01 DIAGNOSIS — I739 Peripheral vascular disease, unspecified: Secondary | ICD-10-CM

## 2024-01-01 DIAGNOSIS — B351 Tinea unguium: Secondary | ICD-10-CM

## 2024-01-01 DIAGNOSIS — Z89611 Acquired absence of right leg above knee: Secondary | ICD-10-CM | POA: Diagnosis not present

## 2024-01-01 DIAGNOSIS — E1151 Type 2 diabetes mellitus with diabetic peripheral angiopathy without gangrene: Secondary | ICD-10-CM | POA: Diagnosis not present

## 2024-01-01 NOTE — Progress Notes (Signed)
 This patient returns to my office for at risk foot care.  This patient requires this care by a professional since this patient will be at risk due to having AK amputation right leg, diabetic neuropathy and pvd.This patient is unable to cut nails himself since the patient cannot reach his nails left foot.  .These nails are painful walking and wearing shoes.This patient presents for at risk foot care today.  General Appearance  Alert, conversant and in no acute stress.  Vascular  Dorsalis pedis and posterior tibial  pulses are absent  left foot..  Capillary return is within normal limits  left foot.  .Cold feet. Left foot.  Neurologic  Senn-Weinstein monofilament wire test within normal limits  left  Muscle power within normal limits left .  Nails Thick disfigured discolored nails with subungual debris  from hallux to fifth toes left foot.. No evidence of bacterial infection or drainage bilaterally.  Orthopedic  No limitations of motion  feet .  No crepitus or effusions noted.  No bony pathology or digital deformities noted.AK Amputation right leg.  Skin  normotropic skin with no porokeratosis noted bilaterally.  No signs of infections or ulcers noted.     Onychomycosis   Pain in left toes  Vascular pathology.  Consent was obtained for treatment procedures.   Mechanical debridement of nails 1-5  left performed with a nail nipper.  Filed with dremel without incident.   To receive diabetic shoes next visit.   Return office visit    3 months                  Told patient to return for periodic foot care and evaluation due to potential at risk complications.   Helane Gunther DPM

## 2024-01-01 NOTE — Addendum Note (Signed)
 Addended by: Helane Gunther on: 01/01/2024 09:53 AM   Modules accepted: Orders

## 2024-01-02 DIAGNOSIS — Z89611 Acquired absence of right leg above knee: Secondary | ICD-10-CM | POA: Diagnosis not present

## 2024-01-04 ENCOUNTER — Other Ambulatory Visit: Payer: Self-pay | Admitting: Hematology

## 2024-01-08 ENCOUNTER — Encounter: Payer: Self-pay | Admitting: Physical Therapy

## 2024-01-08 ENCOUNTER — Ambulatory Visit: Payer: Self-pay | Attending: Internal Medicine | Admitting: Physical Therapy

## 2024-01-08 ENCOUNTER — Telehealth: Payer: Self-pay | Admitting: Physical Therapy

## 2024-01-08 VITALS — BP 138/56 | HR 85

## 2024-01-08 DIAGNOSIS — R2681 Unsteadiness on feet: Secondary | ICD-10-CM

## 2024-01-08 DIAGNOSIS — M542 Cervicalgia: Secondary | ICD-10-CM | POA: Diagnosis not present

## 2024-01-08 DIAGNOSIS — M6281 Muscle weakness (generalized): Secondary | ICD-10-CM

## 2024-01-08 DIAGNOSIS — M79672 Pain in left foot: Secondary | ICD-10-CM | POA: Diagnosis not present

## 2024-01-08 DIAGNOSIS — R2689 Other abnormalities of gait and mobility: Secondary | ICD-10-CM | POA: Diagnosis not present

## 2024-01-08 NOTE — Telephone Encounter (Signed)
 Dr. Donette Larry,   Alan Mulder was evaluated by PT today and would benefit from an OT evaluation for LUE weakness secondary to CVA.    If you agree, please place an order in [facility] workque in [facility] or fax the order to [phone].   Thank you, Josephine Igo, PT, DPT [facility] [address] [place], [place]  [address] Phone:  [phone] Fax:  [phone]

## 2024-01-08 NOTE — Therapy (Signed)
 OUTPATIENT PHYSICAL THERAPY NEURO EVALUATION   Patient Name: Tyler Whitehead MRN: [id number] DOB:05-02-1941, 83 y.o., male Today's Date: 01/08/2024   PCP: Georgann Housekeeper, MD REFERRING PROVIDER: Georgann Housekeeper, MD  END OF SESSION:  PT End of Session - 01/08/24 1317     Visit Number 1    Number of Visits 13    Date for PT Re-Evaluation 02/26/24    Authorization Type [employer]    PT Start Time 1315    PT Stop Time 1400    PT Time Calculation (min) 45 min    Equipment Utilized During Treatment Gait belt;Other (comment)   R AKA prosthetic   Activity Tolerance Patient tolerated treatment well    Behavior During Therapy WFL for tasks assessed/performed              Past Medical History:  Diagnosis Date   CAD (coronary artery disease)    a. S/p 3V CABG 2000 with LIMA-LAD, left radial-PDA, SVG-OM1. b. 2014: stent to SVG-OM1; then found to have obstruction distal to LAD anastamosis of LIMA s/p DES.    CHF (congestive heart failure) (HCC)    Diverticular disease    DM (diabetes mellitus) (HCC)    Hemorrhoid    HTN (hypertension)    Hyperlipidemia    Peripheral vascular disease (HCC)    a. R femoropopliteal bypass ~2012 in [place], reportedly shown to be occluded. Now followed by Dr. Allyson Sabal.   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CC)    RBBB    Past Surgical History:  Procedure Laterality Date   ABDOMINAL ANGIOGRAM N/A 01/18/2015   Procedure: ABDOMINAL ANGIOGRAM;  Surgeon: Nada Libman, MD;  Location[facility] CATH LAB;  Service: Cardiovascular;  Laterality: N/A;   ABDOMINAL AORTAGRAM N/A 09/21/2014   Procedure: ABDOMINAL Ronny Flurry;  Surgeon: Sherren Kerns, MD;  Location: [facility] CATH LAB;  Service: Cardiovascular;  Laterality: N/A;   ABDOMINAL AORTOGRAM W/LOWER EXTREMITY N/A 03/28/2021   Procedure: ABDOMINAL AORTOGRAM W/LOWER EXTREMITY;  Surgeon: Nada Libman, MD;  Location: [facility] INVASIVE CV LAB;  Service: Cardiovascular;  Laterality: N/A;   CARDIAC CATHETERIZATION  May 2014   CORONARY ARTERY  BYPASS GRAFT  2000   LM occluded, LIMA to LAD patent but distal native LAD disease, saphenous vein bypass graft to OM with 95% stenosis in the midportion, right coronary artery occluded, radial artery graft to right coronary artery patent but was diffusely small. Marginal graft stented. 7 2014. Of note he had multiple previous stents in the right coronary artery. The distal LAD was treated with angioplasty. I   CORONARY STENT INTERVENTION N/A 01/03/2023   Procedure: CORONARY STENT INTERVENTION;  Surgeon: Swaziland, Peter M, MD;  Location: [facility] INVASIVE CV LAB;  Service: Cardiovascular;  Laterality: N/A;   LEFT HEART CATH AND CORS/GRAFTS ANGIOGRAPHY N/A 01/03/2023   Procedure: LEFT HEART CATH AND CORS/GRAFTS ANGIOGRAPHY;  Surgeon: Swaziland, Peter M, MD;  Location: [facility] INVASIVE CV LAB;  Service: Cardiovascular;  Laterality: N/A;   LOOP RECORDER IMPLANT N/A 07/12/2014   Procedure: LOOP RECORDER IMPLANT;  Surgeon: Marinus Maw, MD;  Location: [facility] CATH LAB;  Service: Cardiovascular;  Laterality: N/A;   LOWER EXTREMITY ANGIOGRAM  09/21/2014   Procedure: LOWER EXTREMITY ANGIOGRAM;  Surgeon: Sherren Kerns, MD;  Location: [facility] CATH LAB;  Service: Cardiovascular;;   PERCUTANEOUS CORONARY STENT INTERVENTION (PCI-S)  April 27 2013   right leg amputation  2017   TEE WITHOUT CARDIOVERSION N/A 07/12/2014   Procedure: TRANSESOPHAGEAL ECHOCARDIOGRAM (TEE);  Surgeon: Pricilla Riffle, MD;  Location: [facility]  ENDOSCOPY;  Service: Cardiovascular;  Laterality: N/A;   VEIN SURGERY     Patient Active Problem List   Diagnosis Date Noted   Cellulitis and abscess of toe 09/26/2023   Stenosis of carotid artery 05/08/2023   Angina pectoris (HCC) 01/03/2023   Gait abnormality 02/20/2022   Memory deficit 02/20/2022   Right hemiparesis (HCC) 02/20/2022   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) 09/08/2021   Atherosclerosis of native artery of left lower extremity with rest pain (HCC) 09/08/2021   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) 11/14/2020   Allergic rhinitis 11/14/2020   Chronic constipation 11/14/2020   Depression 11/14/2020   Diabetic peripheral neuropathy associated with type 2 diabetes mellitus (HCC) 11/14/2020   Dysphagia 11/14/2020   Hyperlipidemia 11/14/2020   Leukocytosis 11/14/2020   Nausea and vomiting 11/14/2020   Presence of aortocoronary bypass graft 11/14/2020   Pure hypercholesterolemia 11/14/2020   Vitamin D deficiency 11/14/2020   Cryptogenic stroke (HCC) 07/20/2015   History of stroke 07/06/2015   Transient vision disturbance, right 07/06/2015   Atherosclerosis of native arteries of extremity with intermittent claudication (HCC) 09/27/2014   Localization-related symptomatic epilepsy and epileptic syndromes with complex partial seizures, not intractable, without status epilepticus (HCC) 08/04/2014   Occipital stroke (HCC) 08/04/2014   Cerebral thrombosis with cerebral infarction (HCC) 07/10/2014   New onset seizure (HCC) 07/09/2014   Diabetic hyperosmolar non-ketotic state (HCC) 07/09/2014   Essential hypertension 07/09/2014   Polycythemia vera (HCC) 07/09/2014   Seizure (HCC) 07/09/2014   Visual field defect 07/09/2014   Headache 06/30/2014   HTN (hypertension) 11/23/2013   Dyslipidemia 11/23/2013   Myelofibrosis (HCC) 11/22/2013   Diverticular disease 11/22/2013   Hemorrhoids 11/22/2013   S/P CABG x 3 11/22/2013   CAD (coronary artery disease) 11/22/2013   Peripheral vascular disease (HCC) 11/22/2013   DM2 (diabetes mellitus, type 2) (HCC) 11/22/2013   Renal cyst, left 11/22/2013   Splenomegaly 11/22/2013    ONSET DATE: 12/05/2023 (referral)   REFERRING DIAG: R26.9 (ICD-10-CM) - Unspecified abnormalities of gait and mobility (referral)   THERAPY DIAG:  Muscle weakness (generalized)  Unsteadiness on feet  Other abnormalities of gait and mobility  Pain in left foot  Cervicalgia  Rationale for Evaluation and Treatment:  Rehabilitation  SUBJECTIVE:                                                                                                                                                                                             SUBJECTIVE STATEMENT: Pt returns to complete evaluation following low BP incident a few weeks ago. Pt presents in [place], not wearing  shoe on L foot due to toe discomfort. States the nail of his big toe fell off. Denies falls since he was last here. States his cardiologist did not change his BP meds and he has been checking his BP daily, has been okay.    Pt accompanied by: self  PERTINENT HISTORY: CAD, CHF, DM, HTN, PVD, myelofibrosis, previous CVA (2015), R AKA (2017)   PAIN:  Are you having pain? No Pt reports discomfort in L big toe but denies pain   PRECAUTIONS: Fall and impaired vision   RED FLAGS: None   WEIGHT BEARING RESTRICTIONS: No  FALLS: Has patient fallen in last 6 months? Yes. Number of falls 5-6  LIVING ENVIRONMENT: Lives with: lives alone Lives in: House/apartment Stairs: No Has following equipment at home: Single point cane, Environmental consultant - 2 wheeled, shower chair, Grab bars, and [place]  PLOF: Requires assistive device for independence  PATIENT GOALS: "To get better standing and walking"   OBJECTIVE:  Note: Objective measures were completed at Evaluation unless otherwise noted.  DIAGNOSTIC FINDINGS: CT from 11/2015   IMPRESSION: No acute intracranial findings. Normal for age cerebral volume. Remote LEFT occipital CVA.  COGNITION: Overall cognitive status: Difficulty to assess due to: no family present and pt reports he has cognitive issues   SENSATION: Pt w/peripheral neuropathy    EDEMA: Chronic swelling in LLE    POSTURE: increased thoracic kyphosis, flexed trunk , and weight shift right  LOWER EXTREMITY ROM:     Active  Right Eval Left Eval  Hip flexion    Hip extension    Hip abduction    Hip adduction    Hip internal  rotation    Hip external rotation    Knee flexion    Knee extension    Ankle dorsiflexion    Ankle plantarflexion    Ankle inversion    Ankle eversion     (Blank rows = not tested)  LOWER EXTREMITY MMT:  Tested in seated position   MMT Right Eval Left Eval  Hip flexion 4 5  Hip extension    Hip abduction 4 5  Hip adduction 4 5  Hip internal rotation    Hip external rotation    Knee flexion  5  Knee extension  4+  Ankle dorsiflexion  5  Ankle plantarflexion    Ankle inversion    Ankle eversion    (Blank rows = not tested)  BED MOBILITY:  Independent per pt   TRANSFERS: Assistive device utilized: Environmental consultant - 2 wheeled  Sit to stand: SBA Stand to sit: SBA Chair to chair: SBA   GAIT: Gait pattern: step to pattern, decreased step length- Left, decreased stance time- Right, decreased stride length, lateral hip instability, and trunk flexed Distance walked: Various short clinic distances  Assistive device utilized: Walker - 2 wheeled Level of assistance: SBA Comments: Pt demonstrates step-to pattern due to dysfunctional knee and SACH foot.   FUNCTIONAL TESTS:   [facility] PT Assessment - 01/08/24 1327       Transfers   Five time sit to stand comments  18.15s w/BUE support   Did not obtain full hip extension on first two reps     Balance   Balance Assessed Yes      Standardized Balance Assessment   Standardized Balance Assessment Berg Balance Test;10 meter walk test    10 Meter Walk 0.49 m/s w/RW   7m over 20.6s, SBA     Berg Balance Test   Sit to Stand Able  to stand  independently using hands    Standing Unsupported Able to stand 2 minutes with supervision    Sitting with Back Unsupported but Feet Supported on Floor or Stool Able to sit 2 minutes under supervision    Stand to Sit Controls descent by using hands    Standing Unsupported with Eyes Closed Able to stand 10 seconds with supervision    Standing Unsupported with Feet Together Needs help to attain position  but able to stand for 30 seconds with feet together              VITALS  Vitals:   01/08/24 1321  BP: (!) 138/56  Pulse: 85                                                                                                                                  TREATMENT:   Self-care/home management  Assessed vitals (see above) and WNL this date  Assessed pt's R AKA prosthetic and noted exoskeletal prosthetic w/fixed ankle and polycentric friction control knee, placing him at a K1 level. At this time, do not feel as though this level is appropriate for pt, as he is fully ambulatory at home and requires a locking knee for stability w/ADLs. Pt to contact prosthetist and inquire about obtaining a new prosthesis.    PATIENT EDUCATION: Education details: POC, eval findings, see self-care section  Person educated: Patient Education method: Explanation and Demonstration Education comprehension: verbalized understanding and needs further education  HOME EXERCISE PROGRAM: To be established   GOALS: Goals reviewed with patient? Yes  SHORT TERM GOALS: Target date: 02/05/2024   AMPPRO to be assessed and LTG updated  Baseline: Goal status: INITIAL  2.  Pt will be independent with initial HEP for improved strength, balance, transfers and gait.  Baseline: not established on eval  Goal status: INITIAL  3.  to be assessed and STG/LTG updated  Baseline:  Goal status: INITIAL    LONG TERM GOALS: Target date: 02/19/2024   Pt will improve gait velocity to at least 0.55 m/s w/LRAD for improved gait efficiency and reduced fall risk  Baseline: 0.49 m/s w/RW Goal status: INITIAL  2.  Pt will improve 5 x STS to less than or equal to 16 seconds w/full hip extension to demonstrate improved functional strength and transfer efficiency.   Baseline: 18.15s  Goal status: INITIAL  3.  AMMPRO goal  Baseline:  Goal status: INITIAL  4.  goal  Baseline:  Goal status:  INITIAL   ASSESSMENT:  CLINICAL IMPRESSION: Patient is a 83 year old male referred to Neuro OPPT for gait abnormality.   Pt's PMH is significant for: CAD, CHF, DM, HTN, PVD, myelofibrosis, previous CVA. The following deficits were present during the exam: decreased endurance, improper gait kinematics 2/2 faulty prosthetic, and impaired balance. Based on falls history and gait speed, pt is an incr risk for falls. Pt would benefit from  skilled PT to address these impairments and functional limitations to maximize functional mobility independence.    OBJECTIVE IMPAIRMENTS: Abnormal gait, cardiopulmonary status limiting activity, decreased activity tolerance, decreased balance, decreased endurance, decreased mobility, difficulty walking, decreased strength, impaired sensation, impaired UE functional use, prosthetic dependency , and pain  ACTIVITY LIMITATIONS: carrying, lifting, transfers, reach over head, and locomotion level  PARTICIPATION LIMITATIONS: meal prep, cleaning, driving, shopping, community activity, and yard work  PERSONAL FACTORS: Age, Fitness, Past/current experiences, and 1-2 comorbidities: previous CVA and R AKA  are also affecting patient's functional outcome.   REHAB POTENTIAL: Good  CLINICAL DECISION MAKING: Evolving/moderate complexity  EVALUATION COMPLEXITY: Moderate  PLAN:  PT FREQUENCY: 2x/week  PT DURATION: 6 weeks  PLANNED INTERVENTIONS: 97164- PT Re-evaluation, 97110-Therapeutic exercises, 97530- Therapeutic activity, 97112- Neuromuscular re-education, 97535- Self Care, 16109- Manual therapy, [phone]- Gait training, [phone]- Prosthetic training, [phone]- Electrical stimulation (manual), Patient/Family education, Balance training, Stair training, Dry Needling, Joint mobilization, Spinal mobilization, and DME instructions  PLAN FOR NEXT SESSION: AMPPRO and and update goals. Did we contact [facility]? Establish HEP for improved functional strength and stability when  reaching out of BOS.     Jill Alexanders Zakari Couchman, PT, DPT 01/08/2024, 3:14 PM

## 2024-01-14 ENCOUNTER — Telehealth: Payer: Self-pay | Admitting: Physical Therapy

## 2024-01-14 ENCOUNTER — Ambulatory Visit: Admitting: Physical Therapy

## 2024-01-14 NOTE — Telephone Encounter (Signed)
 Per pt request, called pt and spoke to him regarding lack of transportation today, causing him to miss his appointment. Pt frustrated that his transportation did not pick him up on time but reports he will be at his next appointment on 4/16 at 12:30.   Jill Alexanders Achille Xiang, PT, DPT

## 2024-01-20 ENCOUNTER — Ambulatory Visit: Admitting: Physical Therapy

## 2024-01-22 ENCOUNTER — Ambulatory Visit: Admitting: Physical Therapy

## 2024-01-22 VITALS — BP 129/61 | HR 78

## 2024-01-22 DIAGNOSIS — M6281 Muscle weakness (generalized): Secondary | ICD-10-CM

## 2024-01-22 DIAGNOSIS — R2681 Unsteadiness on feet: Secondary | ICD-10-CM

## 2024-01-22 DIAGNOSIS — R2689 Other abnormalities of gait and mobility: Secondary | ICD-10-CM

## 2024-01-22 NOTE — Therapy (Signed)
 OUTPATIENT PHYSICAL THERAPY NEURO TREATMENT   Patient Name: Tyler Whitehead MRN: [id number] DOB:Feb 14, 1941, 83 y.o., male Today's Date: 01/22/2024   PCP: Georgann Housekeeper, MD REFERRING PROVIDER: Georgann Housekeeper, MD  END OF SESSION:  PT End of Session - 01/22/24 1234     Visit Number 2    Number of Visits 13    Date for PT Re-Evaluation 02/26/24    Authorization Type [employer]    PT Start Time 1230    PT Stop Time 1301   Pt had to leave early due to transportation   PT Time Calculation (min) 31 min    Equipment Utilized During Treatment Gait belt;Other (comment)   R AKA prosthetic   Activity Tolerance Patient tolerated treatment well    Behavior During Therapy WFL for tasks assessed/performed               Past Medical History:  Diagnosis Date   CAD (coronary artery disease)    a. S/p 3V CABG 2000 with LIMA-LAD, left radial-PDA, SVG-OM1. b. 2014: stent to SVG-OM1; then found to have obstruction distal to LAD anastamosis of LIMA s/p DES.    CHF (congestive heart failure) (HCC)    Diverticular disease    DM (diabetes mellitus) (HCC)    Hemorrhoid    HTN (hypertension)    Hyperlipidemia    Peripheral vascular disease (HCC)    a. R femoropopliteal bypass ~2012 in [place], reportedly shown to be occluded. Now followed by Dr. Allyson Sabal.   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CC)    RBBB    Past Surgical History:  Procedure Laterality Date   ABDOMINAL ANGIOGRAM N/A 01/18/2015   Procedure: ABDOMINAL ANGIOGRAM;  Surgeon: Nada Libman, MD;  Location: [facility] CATH LAB;  Service: Cardiovascular;  Laterality: N/A;   ABDOMINAL AORTAGRAM N/A 09/21/2014   Procedure: ABDOMINAL Ronny Flurry;  Surgeon: Sherren Kerns, MD;  Location: [facility] CATH LAB;  Service: Cardiovascular;  Laterality: N/A;   ABDOMINAL AORTOGRAM W/LOWER EXTREMITY N/A 03/28/2021   Procedure: ABDOMINAL AORTOGRAM W/LOWER EXTREMITY;  Surgeon: Nada Libman, MD;  Location: [facility] INVASIVE CV LAB;  Service: Cardiovascular;  Laterality: N/A;    CARDIAC CATHETERIZATION  May 2014   CORONARY ARTERY BYPASS GRAFT  2000   LM occluded, LIMA to LAD patent but distal native LAD disease, saphenous vein bypass graft to OM with 95% stenosis in the midportion, right coronary artery occluded, radial artery graft to right coronary artery patent but was diffusely small. Marginal graft stented. 7 2014. Of note he had multiple previous stents in the right coronary artery. The distal LAD was treated with angioplasty. I   CORONARY STENT INTERVENTION N/A 01/03/2023   Procedure: CORONARY STENT INTERVENTION;  Surgeon: Swaziland, Peter M, MD;  Location: [facility] INVASIVE CV LAB;  Service: Cardiovascular;  Laterality: N/A;   LEFT HEART CATH AND CORS/GRAFTS ANGIOGRAPHY N/A 01/03/2023   Procedure: LEFT HEART CATH AND CORS/GRAFTS ANGIOGRAPHY;  Surgeon: Swaziland, Peter M, MD;  Location: [facility] INVASIVE CV LAB;  Service: Cardiovascular;  Laterality: N/A;   LOOP RECORDER IMPLANT N/A 07/12/2014   Procedure: LOOP RECORDER IMPLANT;  Surgeon: Marinus Maw, MD;  Location: [facility] CATH LAB;  Service: Cardiovascular;  Laterality: N/A;   LOWER EXTREMITY ANGIOGRAM  09/21/2014   Procedure: LOWER EXTREMITY ANGIOGRAM;  Surgeon: Sherren Kerns, MD;  Location: [facility] CATH LAB;  Service: Cardiovascular;;   PERCUTANEOUS CORONARY STENT INTERVENTION (PCI-S)  April 27 2013   right leg amputation  2017   TEE WITHOUT CARDIOVERSION N/A 07/12/2014   Procedure: TRANSESOPHAGEAL ECHOCARDIOGRAM (  TEE);  Surgeon: Elmyra Haggard, MD;  Location: [facility] ENDOSCOPY;  Service: Cardiovascular;  Laterality: N/A;   VEIN SURGERY     Patient Active Problem List   Diagnosis Date Noted   Cellulitis and abscess of toe 09/26/2023   Stenosis of carotid artery 05/08/2023   Angina pectoris (HCC) 01/03/2023   Gait abnormality 02/20/2022   Memory deficit 02/20/2022   Right hemiparesis (HCC) 02/20/2022   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) 09/08/2021   Atherosclerosis of native artery of left lower extremity  with rest pain (HCC) 09/08/2021   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 (HCC) 11/14/2020   Allergic rhinitis 11/14/2020   Chronic constipation 11/14/2020   Depression 11/14/2020   Diabetic peripheral neuropathy associated with type 2 diabetes mellitus (HCC) 11/14/2020   Dysphagia 11/14/2020   Hyperlipidemia 11/14/2020   Leukocytosis 11/14/2020   Nausea and vomiting 11/14/2020   Presence of aortocoronary bypass graft 11/14/2020   Pure hypercholesterolemia 11/14/2020   Vitamin D deficiency 11/14/2020   Cryptogenic stroke (HCC) 07/20/2015   History of stroke 07/06/2015   Transient vision disturbance, right 07/06/2015   Atherosclerosis of native arteries of extremity with intermittent claudication (HCC) 09/27/2014   Localization-related symptomatic epilepsy and epileptic syndromes with complex partial seizures, not intractable, without status epilepticus (HCC) 08/04/2014   Occipital stroke (HCC) 08/04/2014   Cerebral thrombosis with cerebral infarction (HCC) 07/10/2014   New onset seizure (HCC) 07/09/2014   Diabetic hyperosmolar non-ketotic state (HCC) 07/09/2014   Essential hypertension 07/09/2014   Polycythemia vera (HCC) 07/09/2014   Seizure (HCC) 07/09/2014   Visual field defect 07/09/2014   Headache 06/30/2014   HTN (hypertension) 11/23/2013   Dyslipidemia 11/23/2013   Myelofibrosis (HCC) 11/22/2013   Diverticular disease 11/22/2013   Hemorrhoids 11/22/2013   S/P CABG x 3 11/22/2013   CAD (coronary artery disease) 11/22/2013   Peripheral vascular disease (HCC) 11/22/2013   DM2 (diabetes mellitus, type 2) (HCC) 11/22/2013   Renal cyst, left 11/22/2013   Splenomegaly 11/22/2013    ONSET DATE: 12/05/2023 (referral)   REFERRING DIAG: R26.9 (ICD-10-CM) - Unspecified abnormalities of gait and mobility (referral)   THERAPY DIAG:  Muscle weakness (generalized)  Unsteadiness on feet  Other abnormalities of gait and mobility  Rationale for Evaluation and Treatment:  Rehabilitation  SUBJECTIVE:                                                                                                                                                                                             SUBJECTIVE STATEMENT: Pt presents in [place]. Denies pain, states he is very fatigued. Did not sleep well last  night. L foot is still bothersome but tolerable.     Pt accompanied by: self  PERTINENT HISTORY: CAD, CHF, DM, HTN, PVD, myelofibrosis, previous CVA (2015), R AKA (2017)   PAIN:  Are you having pain? No Pt reports discomfort in L big toe but denies pain   PRECAUTIONS: Fall and impaired vision   RED FLAGS: None   WEIGHT BEARING RESTRICTIONS: No  FALLS: Has patient fallen in last 6 months? Yes. Number of falls 5-6  LIVING ENVIRONMENT: Lives with: lives alone Lives in: House/apartment Stairs: No Has following equipment at home: Single point cane, Environmental consultant - 2 wheeled, shower chair, Grab bars, and Hoveround  PLOF: Requires assistive device for independence  PATIENT GOALS: "To get better standing and walking"   OBJECTIVE:  Note: Objective measures were completed at Evaluation unless otherwise noted.  DIAGNOSTIC FINDINGS: CT from 11/2015   IMPRESSION: No acute intracranial findings. Normal for age cerebral volume. Remote LEFT occipital CVA.  COGNITION: Overall cognitive status: Difficulty to assess due to: no family present and pt reports he has cognitive issues   SENSATION: Pt w/peripheral neuropathy    EDEMA: Chronic swelling in LLE    POSTURE: increased thoracic kyphosis, flexed trunk , and weight shift right  LOWER EXTREMITY ROM:     Active  Right Eval Left Eval  Hip flexion    Hip extension    Hip abduction    Hip adduction    Hip internal rotation    Hip external rotation    Knee flexion    Knee extension    Ankle dorsiflexion    Ankle plantarflexion    Ankle inversion    Ankle eversion     (Blank rows = not tested)  LOWER  EXTREMITY MMT:  Tested in seated position   MMT Right Eval Left Eval  Hip flexion 4 5  Hip extension    Hip abduction 4 5  Hip adduction 4 5  Hip internal rotation    Hip external rotation    Knee flexion  5  Knee extension  4+  Ankle dorsiflexion  5  Ankle plantarflexion    Ankle inversion    Ankle eversion    (Blank rows = not tested)  BED MOBILITY:  Independent per pt   TRANSFERS: Assistive device utilized: Environmental consultant - 2 wheeled  Sit to stand: SBA Stand to sit: SBA Chair to chair: SBA   GAIT: Gait pattern: step to pattern, decreased step length- Left, decreased stance time- Right, decreased stride length, lateral hip instability, and trunk flexed Distance walked: Various short clinic distances  Assistive device utilized: Walker - 2 wheeled Level of assistance: SBA Comments: Pt demonstrates step-to pattern due to dysfunctional knee and SACH foot.    VITALS  Vitals:   01/22/24 1239  BP: 129/61  Pulse: 78  TREATMENT:   Self-care/home management  Assessed vitals (see above) and WNL this date   Physical Performance   AMPUTEE MOBILITY PREDICTOR ASSESSMENT TOOL Initial instructions: Client is seated in a hard chair with arms. The following manoeuvres are tested with or without the use of the prosthesis.  Advise the person of each task or group of tasks prior to performance.  Please avoid unnecessary chatter throughout the test.  Safety First, no task should be performed if either the tester or client is uncertain of a safe outcome.  1. Sitting Balance: Sit forward in a chair with arms folded across chest for 60s. Cannot sit upright independently for 60s Can sit upright independently for 60s = 0 = 1  1  2. Sitting reach:  Reach forwards and grasp the ruler.  (Tester holds ruler 12in beyond extended arms midline to the sternum) Does  not attempt Cannot grasp or requires arm support Reaches forward and successfully grasps item.  = 0 = 1  = 2    2  3. Chair to chair transfer: 2 chairs at 90. Pt. may choose direction and use their upper limbs. Cannot do or requires physical assistance Performs independently, but appears unsteady Performs independently, appears to be steady and safe = 0  = 1 = 2  2  4. Arises from a chair: Ask pt. to fold arms across chest and stand. If unable, use arms or assistive device. Unable without help (physical assistance) Able, uses arms/assist device to help Able, without using arms = 0  = 1 = 2   1  5. Attempts to arise from a chair: (stopwatch ready) If attempt in no. 4. was without arms then ignore and allow another attempt without penalty. Unable without help (physical assistance) Able requires >1 attempt Able to rise one attempt = 0  = 1 = 2   2  6. Immediate Standing Balance: (first 5s) Begin timing immediately. Unsteady (staggers, moves foot, sways ) Steady using walking aid or other support Steady without walker or other support = 0 = 1  = 2  1  7. Standing Balance (30s): (stopwatch ready) For item no.'s 7 & 8, first attempt is without assistive device.  If support is required allow after first attempt Unsteady Steady but uses walking aid or other support Standing without support = 0  = 1 = 2    2  8. Single limb standing balance: (stopwatch ready) Time the duration of single limb standing on both the sound and prosthetic limb up to 30s.   Grade the quality, not the time.  *Eliminate item 8 for AMPnoPRO*  Sound side  30 seconds  Prosthetic side 30 seconds Non-prosthetic side Unsteady Steady but uses walking aid or other support for 30s Single-limb standing without support for 30s  Prosthetic Side Unsteady Steady but uses walking aid or other support for 30s Single-limb standing without support for 30s  = 0 = 1  = 2    = 0  = 1  = 2     1     1   9. Standing reach: Reach forward and grasp the ruler.  (Tester holds ruler 12in beyond extended arm(s) midline to the sternum) Does not attempt Cannot grasp or requires arm support on assistive device Reaches forward and successfully grasps item no support = 0  = 1  = 2   1  10. Nudge test: With feet as close together as possible, examiner pushes lightly on pt.'s sternum with palm of  hand 3 times (toes should rise) Begins to fall Staggers, grabs, catches self ore uses assistive device Steady = 0  = 1 = 2   2  11. Eyes Closed: (at maximum position #7) If support is required grade as unsteady. Unsteady or grips assistive device Steady without any use of assistive device = 0  = 1  1    12. Pick up objects off the floor: Pick up a pencil off the floor placed midline 12in in front of foot. Unable to pick up object and return to standing Performs with some help (table, chair, walking aid etc) Performs independently (without help) = 0  = 1   = 2  1  13. Sitting down:  Ask pt. to fold arms across chest and sit. If unable, use arm or assistive device. Unsafe (misjudged distance, falls into chair ) Uses arms, assistive device or not a smooth motion Safe, smooth motion = 0  = 1 = 2   1  14. Initiation of gait: (immediately after told to "go") Any hesitancy or multiple attempts to start No hesitancy = 0 = 1    15. Step length and height: Walk a measured distance of 66ft twice (up and back). Four scores are required or two scores (a. & b.) for each leg. "Marked deviation" is defined as extreme substitute movements to avoid clearing the floor. a. Swing Foot Does not advance a minimum of 12in Advances a minimum of 12in  b. Foot Clearance Foot does not completely clear floor without deviation Foot completely clears floor without marked deviation  = 0  = 1   = 0 = 1 Prosthesis      Sound       16. Step Continuity Stopping or  discontinuity between steps (stop & go gait) Steps appear continuous = 0  = 1    17. Turning:  180 degree turn when returning to chair. Unable to turn, requires intervention to prevent falling Greater than three steps but completes task without intervention No more than three continuous steps with or without assistive aid = 0  = 1  = 2      18. Variable cadence:  Walk a distance of 80ft fast as possible safely 4 times.  (Speeds may vary from slow to fast and fast to slow varying cadence) Unable to vary cadence in a controlled manner Asymmetrical increase in cadence controlled manner Symmetrical increase in speed in a controlled manner  = 0 = 1 = 2        19. Stepping over an obstacle: Place a movable box of 4in in height in the walking path. Cannot step over the box Catches foot, interrupts stride Steps over without interrupting stride = 0 = 1 = 2     20. Stairs (must have at least 2 steps):  Try to go up and down these stairs without holding on to the railing.  Don't hesitate to permit pt. to hold on to rail.  Safety First, if examiner feels that any risk in involved omit and score as 0.  Ascending Unsteady, cannot do One step at a time, or must hold on to railing or device Step over step, does not hold onto the railing or device  Descending Unsteady, cannot do One step at a time, or must hold on to railing or device Step over step, does not hold onto the railing or device  = 0  = 1 = 2    = 0  = 1 =  2          21. Assistive device selection:  Add points for the use of an assistive device if used for two or more items.  If testing without prosthesis use of appropriate assistive device is mandatory.   Bed bound Wheelchair / Parallel Bars Walker Crutches (axillary or forearm) Cane (straight or quad) None = 0 = 1 = 2 = 3 = 4 = 5    2    Total Score      AMPnoPRO      /43                          AMPPRO       /47     K LEVEL (converted  from AMP score)  AMPnoPRO   K0 = (0-8)    K1= (9-20)    K2 = (21-28)    K3 = (29-36)    K4 = (37-43)  AMPPRO    K1 = (15-26)      K2 = (27-36)     K3 = (37-42)     K4 = (43-47)  Unable to finish test due to time constraints, so will complete next session   PATIENT EDUCATION: Education details: Results of Amppro so far, plan for next session  Person educated: Patient Education method: Explanation and Demonstration Education comprehension: verbalized understanding and needs further education  HOME EXERCISE PROGRAM: To be established   GOALS: Goals reviewed with patient? Yes  SHORT TERM GOALS: Target date: 02/05/2024   AMPPRO to be assessed and LTG updated  Baseline: Goal status: INITIAL  2.  Pt will be independent with initial HEP for improved strength, balance, transfers and gait.  Baseline: not established on eval  Goal status: INITIAL  3.  to be assessed and STG/LTG updated  Baseline:  Goal status: INITIAL    LONG TERM GOALS: Target date: 02/19/2024   Pt will improve gait velocity to at least 0.55 m/s w/LRAD for improved gait efficiency and reduced fall risk  Baseline: 0.49 m/s w/RW Goal status: INITIAL  2.  Pt will improve 5 x STS to less than or equal to 16 seconds w/full hip extension to demonstrate improved functional strength and transfer efficiency.   Baseline: 18.15s  Goal status: INITIAL  3.  AMMPRO goal  Baseline:  Goal status: INITIAL  4.  goal  Baseline:  Goal status: INITIAL   ASSESSMENT:  CLINICAL IMPRESSION: Session limited as pt's transportation picked pt up too early, so unable to complete full visit. Emphasis of skilled PT session on assessing K level via Amppro to determine if pt can obtain a safer prosthetic knee. Pt very fatigued this date, reporting he has not been sleeping well, but vitals WNL. Will finish Amppro next session and follow-up w/[facility]. Continue POC.    OBJECTIVE IMPAIRMENTS: Abnormal gait, cardiopulmonary  status limiting activity, decreased activity tolerance, decreased balance, decreased endurance, decreased mobility, difficulty walking, decreased strength, impaired sensation, impaired UE functional use, prosthetic dependency , and pain  ACTIVITY LIMITATIONS: carrying, lifting, transfers, reach over head, and locomotion level  PARTICIPATION LIMITATIONS: meal prep, cleaning, driving, shopping, community activity, and yard work  PERSONAL FACTORS: Age, Fitness, Past/current experiences, and 1-2 comorbidities: previous CVA and R AKA  are also affecting patient's functional outcome.   REHAB POTENTIAL: Good  CLINICAL DECISION MAKING: Evolving/moderate complexity  EVALUATION COMPLEXITY: Moderate  PLAN:  PT FREQUENCY: 2x/week  PT DURATION: 6 weeks  PLANNED INTERVENTIONS: 16109-  PT Re-evaluation, 97110-Therapeutic exercises, 97530- Therapeutic activity, [id number]- Neuromuscular re-education, [phone]- Self Care, 60454- Manual therapy, [phone]- Gait training, [phone]- Prosthetic training, [phone]- Electrical stimulation (manual), Patient/Family education, Balance training, Stair training, Dry Needling, Joint mobilization, Spinal mobilization, and DME instructions  PLAN FOR NEXT SESSION: AMPPRO and and update goals. Did we contact [facility]? Establish HEP for improved functional strength and stability when reaching out of BOS.     Sherline Eberwein E Glorene Leitzke, PT, DPT 01/22/2024, 1:06 PM

## 2024-01-27 ENCOUNTER — Other Ambulatory Visit: Payer: Self-pay | Admitting: Podiatry

## 2024-01-27 ENCOUNTER — Telehealth: Payer: Self-pay

## 2024-01-27 ENCOUNTER — Encounter: Payer: Self-pay | Admitting: Podiatry

## 2024-01-27 ENCOUNTER — Ambulatory Visit (INDEPENDENT_AMBULATORY_CARE_PROVIDER_SITE_OTHER): Admitting: Podiatry

## 2024-01-27 ENCOUNTER — Ambulatory Visit: Admitting: Physical Therapy

## 2024-01-27 ENCOUNTER — Ambulatory Visit (INDEPENDENT_AMBULATORY_CARE_PROVIDER_SITE_OTHER)

## 2024-01-27 DIAGNOSIS — I739 Peripheral vascular disease, unspecified: Secondary | ICD-10-CM | POA: Diagnosis not present

## 2024-01-27 DIAGNOSIS — M6281 Muscle weakness (generalized): Secondary | ICD-10-CM | POA: Diagnosis not present

## 2024-01-27 DIAGNOSIS — E1151 Type 2 diabetes mellitus with diabetic peripheral angiopathy without gangrene: Secondary | ICD-10-CM | POA: Diagnosis not present

## 2024-01-27 DIAGNOSIS — M79672 Pain in left foot: Secondary | ICD-10-CM

## 2024-01-27 DIAGNOSIS — L84 Corns and callosities: Secondary | ICD-10-CM | POA: Diagnosis not present

## 2024-01-27 DIAGNOSIS — R2689 Other abnormalities of gait and mobility: Secondary | ICD-10-CM

## 2024-01-27 DIAGNOSIS — R2681 Unsteadiness on feet: Secondary | ICD-10-CM

## 2024-01-27 NOTE — Telephone Encounter (Signed)
 Burleigh Carp with [facility] , called stating that patient is having some swelling to left foot and necrotic tissue on left toe. She would like patient seen this week before continuing PT.   Please call Burleigh Carp at [phone] or patient at [phone] to schedule and appointment.

## 2024-01-27 NOTE — Progress Notes (Signed)
  Subjective:  Patient ID: Tyler Whitehead, male    DOB: 29-May-1941,   MRN: [id number]  No chief complaint on file.   83 y.o. male presents for concern of discoloration and changes in left third toe. Patient relates he is getting pain in the great toe around the nails.Relates burning and tingling in their feet. Patient has history of right AKA and PVD. Patient is diabetic and last A1c was  Lab Results  Component Value Date   HGBA1C 6.9 (H) 01/04/2023   .   PCP:  Husain, Karrar, MD    . Denies any other pedal complaints. Denies n/v/f/c.   Past Medical History:  Diagnosis Date   CAD (coronary artery disease)    a. S/p 3V CABG 2000 with LIMA-LAD, left radial-PDA, SVG-OM1. b. 2014: stent to SVG-OM1; then found to have obstruction distal to LAD anastamosis of LIMA s/p DES.    CHF (congestive heart failure) (HCC)    Diverticular disease    DM (diabetes mellitus) (HCC)    Hemorrhoid    HTN (hypertension)    Hyperlipidemia    Peripheral vascular disease (HCC)    a. R femoropopliteal bypass ~2012 in [place], reportedly shown to be occluded. Now followed by Dr. Katheryne Pane.   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CC)    RBBB     Objective:  Physical Exam: Vascular: DP/PT pulses 0/4 left CFT <3 seconds. Absent hair growth on digits. Edema noted to bilateral lower extremities. Xerosis noted bilaterally.  Skin. No lacerations or abrasions bilateral feet. Nails 1-5 bilateral  are thickened discolored and elongated with subungual debris. Dorsal left third digit with pre-ulcerative lesion and some surrounding darkening. No open ulceration noted currently. Mild darkening surrounding area likely from pressure to toe.  Musculoskeletal: MMT 5/5 bilateral lower extremities in DF, PF, Inversion and Eversion. Deceased ROM in DF of ankle joint.  Neurological: Sensation intact to light touch. Protective sensation diminished bilateral.     Left ABIs appear decreased compared to prior study on 02/26/22.    Summary:  Left:  Resting left ankle-brachial index indicates critical left limb  ischemia. The left toe-brachial index is abnormal.   Assessment:   1. Type II diabetes mellitus with peripheral circulatory disorder (HCC)   2. Peripheral vascular disease (HCC)   3. Pre-ulcerative calluses      Plan:  Patient was evaluated and treated and all questions answered. -Discussed and educated patient on diabetic foot care, especially with  regards to the vascular, neurological and musculoskeletal systems.  -Stressed the importance of good glycemic control and the detriment of not  controlling glucose levels in relation to the foot. -Discussed supportive shoes at all times and checking feet regularly.  -Mechanically debrided hyperkeratotic lesion as courtesy and no undelrying ulceration. Do have concern for pre-ulceration and advised on cushioning area and using betadine to keep from getting infected.  -Did discuss ingrown nails and pain in great toe and advised patient given poor circulation would not be a good candidate for ingrown nail procedure and would present risk for loss of toe or limb.  -May continue with PT.  -Discussed if any worsening of foot return for recheck.  -Answered all patient questions -Patient to return  as scheduled for rfc -Patient advised to call the office if any problems or questions arise in the meantime.   Jennefer Moats, DPM

## 2024-01-27 NOTE — Therapy (Signed)
 OUTPATIENT PHYSICAL THERAPY NEURO TREATMENT   Patient Name: Tyler Whitehead MRN: [id number] DOB:10-08-1941, 83 y.o., male Today's Date: 01/27/2024   PCP: Jearldine Mina, MD REFERRING PROVIDER: Jearldine Mina, MD  END OF SESSION:  PT End of Session - 01/27/24 1233     Visit Number 3    Number of Visits 13    Date for PT Re-Evaluation 02/26/24    Authorization Type [employer]    PT Start Time 1231    PT Stop Time 1309    PT Time Calculation (min) 38 min    Equipment Utilized During Treatment Other (comment)   R AKA prosthetic   Activity Tolerance Other (comment)   Infection in L foot   Behavior During Therapy WFL for tasks assessed/performed                Past Medical History:  Diagnosis Date   CAD (coronary artery disease)    a. S/p 3V CABG 2000 with LIMA-LAD, left radial-PDA, SVG-OM1. b. 2014: stent to SVG-OM1; then found to have obstruction distal to LAD anastamosis of LIMA s/p DES.    CHF (congestive heart failure) (HCC)    Diverticular disease    DM (diabetes mellitus) (HCC)    Hemorrhoid    HTN (hypertension)    Hyperlipidemia    Peripheral vascular disease (HCC)    a. R femoropopliteal bypass ~2012 in [place], reportedly shown to be occluded. Now followed by Dr. Katheryne Pane.   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CC)    RBBB    Past Surgical History:  Procedure Laterality Date   ABDOMINAL ANGIOGRAM N/A 01/18/2015   Procedure: ABDOMINAL ANGIOGRAM;  Surgeon: Margherita Shell, MD;  Location: [facility] CATH LAB;  Service: Cardiovascular;  Laterality: N/A;   ABDOMINAL AORTAGRAM N/A 09/21/2014   Procedure: ABDOMINAL Tommi Fraise;  Surgeon: Richrd Char, MD;  Location: [facility] CATH LAB;  Service: Cardiovascular;  Laterality: N/A;   ABDOMINAL AORTOGRAM W/LOWER EXTREMITY N/A 03/28/2021   Procedure: ABDOMINAL AORTOGRAM W/LOWER EXTREMITY;  Surgeon: Margherita Shell, MD;  Location: [facility] INVASIVE CV LAB;  Service: Cardiovascular;  Laterality: N/A;   CARDIAC CATHETERIZATION  May 2014   CORONARY ARTERY  BYPASS GRAFT  2000   LM occluded, LIMA to LAD patent but distal native LAD disease, saphenous vein bypass graft to OM with 95% stenosis in the midportion, right coronary artery occluded, radial artery graft to right coronary artery patent but was diffusely small. Marginal graft stented. 7 2014. Of note he had multiple previous stents in the right coronary artery. The distal LAD was treated with angioplasty. I   CORONARY STENT INTERVENTION N/A 01/03/2023   Procedure: CORONARY STENT INTERVENTION;  Surgeon: Swaziland, Peter M, MD;  Location: [facility] INVASIVE CV LAB;  Service: Cardiovascular;  Laterality: N/A;   LEFT HEART CATH AND CORS/GRAFTS ANGIOGRAPHY N/A 01/03/2023   Procedure: LEFT HEART CATH AND CORS/GRAFTS ANGIOGRAPHY;  Surgeon: Swaziland, Peter M, MD;  Location: [facility] INVASIVE CV LAB;  Service: Cardiovascular;  Laterality: N/A;   LOOP RECORDER IMPLANT N/A 07/12/2014   Procedure: LOOP RECORDER IMPLANT;  Surgeon: Tammie Fall, MD;  Location: [facility] CATH LAB;  Service: Cardiovascular;  Laterality: N/A;   LOWER EXTREMITY ANGIOGRAM  09/21/2014   Procedure: LOWER EXTREMITY ANGIOGRAM;  Surgeon: Richrd Char, MD;  Location: [facility] CATH LAB;  Service: Cardiovascular;;   PERCUTANEOUS CORONARY STENT INTERVENTION (PCI-S)  April 27 2013   right leg amputation  2017   TEE WITHOUT CARDIOVERSION N/A 07/12/2014   Procedure: TRANSESOPHAGEAL ECHOCARDIOGRAM (TEE);  Surgeon: Elmyra Haggard,  MD;  Location: [facility] ENDOSCOPY;  Service: Cardiovascular;  Laterality: N/A;   VEIN SURGERY     Patient Active Problem List   Diagnosis Date Noted   Cellulitis and abscess of toe 09/26/2023   Stenosis of carotid artery 05/08/2023   Angina pectoris (HCC) 01/03/2023   Gait abnormality 02/20/2022   Memory deficit 02/20/2022   Right hemiparesis (HCC) 02/20/2022   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) 09/08/2021   Atherosclerosis of native artery of left lower extremity with rest pain (HCC) 09/08/2021   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) 11/14/2020   Allergic rhinitis 11/14/2020   Chronic constipation 11/14/2020   Depression 11/14/2020   Diabetic peripheral neuropathy associated with type 2 diabetes mellitus (HCC) 11/14/2020   Dysphagia 11/14/2020   Hyperlipidemia 11/14/2020   Leukocytosis 11/14/2020   Nausea and vomiting 11/14/2020   Presence of aortocoronary bypass graft 11/14/2020   Pure hypercholesterolemia 11/14/2020   Vitamin D  deficiency 11/14/2020   Cryptogenic stroke (HCC) 07/20/2015   History of stroke 07/06/2015   Transient vision disturbance, right 07/06/2015   Atherosclerosis of native arteries of extremity with intermittent claudication (HCC) 09/27/2014   Localization-related symptomatic epilepsy and epileptic syndromes with complex partial seizures, not intractable, without status epilepticus (HCC) 08/04/2014   Occipital stroke (HCC) 08/04/2014   Cerebral thrombosis with cerebral infarction (HCC) 07/10/2014   New onset seizure (HCC) 07/09/2014   Diabetic hyperosmolar non-ketotic state (HCC) 07/09/2014   Essential hypertension 07/09/2014   Polycythemia vera (HCC) 07/09/2014   Seizure (HCC) 07/09/2014   Visual field defect 07/09/2014   Headache 06/30/2014   HTN (hypertension) 11/23/2013   Dyslipidemia 11/23/2013   Myelofibrosis (HCC) 11/22/2013   Diverticular disease 11/22/2013   Hemorrhoids 11/22/2013   S/P CABG x 3 11/22/2013   CAD (coronary artery disease) 11/22/2013   Peripheral vascular disease (HCC) 11/22/2013   DM2 (diabetes mellitus, type 2) (HCC) 11/22/2013   Renal cyst, left 11/22/2013   Splenomegaly 11/22/2013    ONSET DATE: 12/05/2023 (referral)   REFERRING DIAG: R26.9 (ICD-10-CM) - Unspecified abnormalities of gait and mobility (referral)   THERAPY DIAG:  Muscle weakness (generalized)  Unsteadiness on feet  Other abnormalities of gait and mobility  Pain in left foot  Rationale for Evaluation and Treatment: Rehabilitation  SUBJECTIVE:                                                                                                                                                                                              SUBJECTIVE STATEMENT: Pt presents in [place]. Denies pain, states he is very fatigued. Did not sleep well last night.  L foot is still bothersome but tolerable.     Pt accompanied by: self  PERTINENT HISTORY: CAD, CHF, DM, HTN, PVD, myelofibrosis, previous CVA (2015), R AKA (2017)   PAIN:  Are you having pain? No Pt reports discomfort in L big toe but denies pain   PRECAUTIONS: Fall and impaired vision   RED FLAGS: None   WEIGHT BEARING RESTRICTIONS: No  FALLS: Has patient fallen in last 6 months? Yes. Number of falls 5-6  LIVING ENVIRONMENT: Lives with: lives alone Lives in: House/apartment Stairs: No Has following equipment at home: Single point cane, Environmental consultant - 2 wheeled, shower chair, Grab bars, and Hoveround  PLOF: Requires assistive device for independence  PATIENT GOALS: "To get better standing and walking"   OBJECTIVE:  Note: Objective measures were completed at Evaluation unless otherwise noted.  DIAGNOSTIC FINDINGS: CT from 11/2015   IMPRESSION: No acute intracranial findings. Normal for age cerebral volume. Remote LEFT occipital CVA.  COGNITION: Overall cognitive status: Difficulty to assess due to: no family present and pt reports he has cognitive issues   SENSATION: Pt w/peripheral neuropathy    EDEMA: Chronic swelling in LLE    POSTURE: increased thoracic kyphosis, flexed trunk , and weight shift right  LOWER EXTREMITY ROM:     Active  Right Eval Left Eval  Hip flexion    Hip extension    Hip abduction    Hip adduction    Hip internal rotation    Hip external rotation    Knee flexion    Knee extension    Ankle dorsiflexion    Ankle plantarflexion    Ankle inversion    Ankle eversion     (Blank rows = not tested)  LOWER EXTREMITY MMT:  Tested in seated  position   MMT Right Eval Left Eval  Hip flexion 4 5  Hip extension    Hip abduction 4 5  Hip adduction 4 5  Hip internal rotation    Hip external rotation    Knee flexion  5  Knee extension  4+  Ankle dorsiflexion  5  Ankle plantarflexion    Ankle inversion    Ankle eversion    (Blank rows = not tested)  BED MOBILITY:  Independent per pt   TRANSFERS: Assistive device utilized: Environmental consultant - 2 wheeled  Sit to stand: SBA Stand to sit: SBA Chair to chair: SBA   GAIT: Gait pattern: step to pattern, decreased step length- Left, decreased stance time- Right, decreased stride length, lateral hip instability, and trunk flexed Distance walked: Various short clinic distances  Assistive device utilized: Walker - 2 wheeled Level of assistance: SBA Comments: Pt demonstrates step-to pattern due to dysfunctional knee and SACH foot.    VITALS  There were no vitals filed for this visit.  TREATMENT:   Self-care/home management  Pt reports his L big toe is now leaking fluid and continues to be painful. Assessed pt's foot and noted small amount of necrotic tissue on lateral aspect of foot w/significant swelling of toe noted. Pt reports toe started leaking about a week ago. Encouraged pt to see Dr. Zettie Hillock prior to returning to PT as pt's toe has been bothering him for several weeks and seems to be getting worse. Pt requested therapist call and make appointment on his behalf, so attempted to call Dr. Jerilee Montane office but left on hold for >20 minutes so informed pt therapist will try later.  Informed pt we will pause PT until he can get in with podiatrist and therapist will call [facility] regarding issues with R knee. Pt in agreement with plan.    PATIENT EDUCATION: Education details: See above  Person educated: Patient Education method: Explanation Education  comprehension: verbalized understanding and needs further education  HOME EXERCISE PROGRAM: To be established   GOALS: Goals reviewed with patient? Yes  SHORT TERM GOALS: Target date: 02/05/2024   AMPPRO to be assessed and LTG updated  Baseline: Goal status: INITIAL  2.  Pt will be independent with initial HEP for improved strength, balance, transfers and gait.  Baseline: not established on eval  Goal status: INITIAL  3.  to be assessed and STG/LTG updated  Baseline:  Goal status: INITIAL    LONG TERM GOALS: Target date: 02/19/2024   Pt will improve gait velocity to at least 0.55 m/s w/LRAD for improved gait efficiency and reduced fall risk  Baseline: 0.49 m/s w/RW Goal status: INITIAL  2.  Pt will improve 5 x STS to less than or equal to 16 seconds w/full hip extension to demonstrate improved functional strength and transfer efficiency.   Baseline: 18.15s  Goal status: INITIAL  3.  AMMPRO goal  Baseline:  Goal status: INITIAL  4.  goal  Baseline:  Goal status: INITIAL   ASSESSMENT:  CLINICAL IMPRESSION: Session limited as pt's L foot continues to be bothersome and is now leaking fluid. Assessed pt's L big toe and noted small area of necrosis along medial cuticle and significant swelling of distal toe. At this time, plan to pause PT until pt sees podiatrist to ensure L toe is okay and in the meantime, therapist to contact [facility] to determine options for R knee as pt unsafe w/current knee. Pt in agreement with plan. Continue POC.    OBJECTIVE IMPAIRMENTS: Abnormal gait, cardiopulmonary status limiting activity, decreased activity tolerance, decreased balance, decreased endurance, decreased mobility, difficulty walking, decreased strength, impaired sensation, impaired UE functional use, prosthetic dependency , and pain  ACTIVITY LIMITATIONS: carrying, lifting, transfers, reach over head, and locomotion level  PARTICIPATION LIMITATIONS: meal prep,  cleaning, driving, shopping, community activity, and yard work  PERSONAL FACTORS: Age, Fitness, Past/current experiences, and 1-2 comorbidities: previous CVA and R AKA  are also affecting patient's functional outcome.   REHAB POTENTIAL: Good  CLINICAL DECISION MAKING: Evolving/moderate complexity  EVALUATION COMPLEXITY: Moderate  PLAN:  PT FREQUENCY: 2x/week  PT DURATION: 6 weeks  PLANNED INTERVENTIONS: 97164- PT Re-evaluation, 97110-Therapeutic exercises, 97530- Therapeutic activity, 97112- Neuromuscular re-education, 97535- Self Care, 16109- Manual therapy, [phone]- Gait training, [phone]- Prosthetic training, [phone]- Electrical stimulation (manual), Patient/Family education, Balance training, Stair training, Dry Needling, Joint mobilization, Spinal mobilization, and DME instructions  PLAN FOR NEXT SESSION: AMPPRO and and update goals. Did we contact [facility]? Establish HEP for improved functional strength and stability when reaching  out of BOS.    Jossiah Smoak E Aida Lemaire, PT, DPT 01/27/2024, 1:10 PM

## 2024-01-28 ENCOUNTER — Telehealth: Payer: Self-pay | Admitting: Physical Therapy

## 2024-01-28 NOTE — Telephone Encounter (Signed)
 Called and spoke to pt informing him that therapist spoke to prosthetist and pt will need to call [facility] and set up appointment this week w/Mike to assess R knee. Pt reports he will call today.   Marjorie Lussier E Dulcie Gammon, PT, DPT

## 2024-01-29 ENCOUNTER — Ambulatory Visit: Admitting: Physical Therapy

## 2024-02-03 ENCOUNTER — Ambulatory Visit: Admitting: Physical Therapy

## 2024-02-03 VITALS — BP 122/54 | HR 92

## 2024-02-03 DIAGNOSIS — M6281 Muscle weakness (generalized): Secondary | ICD-10-CM

## 2024-02-03 DIAGNOSIS — R2681 Unsteadiness on feet: Secondary | ICD-10-CM

## 2024-02-03 DIAGNOSIS — R2689 Other abnormalities of gait and mobility: Secondary | ICD-10-CM

## 2024-02-03 DIAGNOSIS — M79672 Pain in left foot: Secondary | ICD-10-CM

## 2024-02-03 NOTE — Therapy (Signed)
 OUTPATIENT PHYSICAL THERAPY NEURO TREATMENT   Patient Name: Tyler Whitehead MRN: [id number] DOB:05/27/1941, 83 y.o., male Today's Date: 02/03/2024   PCP: Jearldine Mina, MD REFERRING PROVIDER: Jearldine Mina, MD  END OF SESSION:  PT End of Session - 02/03/24 1234     Visit Number 4    Number of Visits 13    Date for PT Re-Evaluation 02/26/24    Authorization Type [employer]    PT Start Time 1231    PT Stop Time 1314    PT Time Calculation (min) 43 min    Equipment Utilized During Treatment Other (comment);Gait belt   R AKA prosthetic   Activity Tolerance Patient tolerated treatment well    Behavior During Therapy WFL for tasks assessed/performed                Past Medical History:  Diagnosis Date   CAD (coronary artery disease)    a. S/p 3V CABG 2000 with LIMA-LAD, left radial-PDA, SVG-OM1. b. 2014: stent to SVG-OM1; then found to have obstruction distal to LAD anastamosis of LIMA s/p DES.    CHF (congestive heart failure) (HCC)    Diverticular disease    DM (diabetes mellitus) (HCC)    Hemorrhoid    HTN (hypertension)    Hyperlipidemia    Peripheral vascular disease (HCC)    a. R femoropopliteal bypass ~2012 in [place], reportedly shown to be occluded. Now followed by Dr. Katheryne Pane.   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CC)    RBBB    Past Surgical History:  Procedure Laterality Date   ABDOMINAL ANGIOGRAM N/A 01/18/2015   Procedure: ABDOMINAL ANGIOGRAM;  Surgeon: Margherita Shell, MD;  Location: [facility] CATH LAB;  Service: Cardiovascular;  Laterality: N/A;   ABDOMINAL AORTAGRAM N/A 09/21/2014   Procedure: ABDOMINAL Tommi Fraise;  Surgeon: Richrd Char, MD;  Location: [facility] CATH LAB;  Service: Cardiovascular;  Laterality: N/A;   ABDOMINAL AORTOGRAM W/LOWER EXTREMITY N/A 03/28/2021   Procedure: ABDOMINAL AORTOGRAM W/LOWER EXTREMITY;  Surgeon: Margherita Shell, MD;  Location: [facility] INVASIVE CV LAB;  Service: Cardiovascular;  Laterality: N/A;   CARDIAC CATHETERIZATION  May 2014   CORONARY  ARTERY BYPASS GRAFT  2000   LM occluded, LIMA to LAD patent but distal native LAD disease, saphenous vein bypass graft to OM with 95% stenosis in the midportion, right coronary artery occluded, radial artery graft to right coronary artery patent but was diffusely small. Marginal graft stented. 7 2014. Of note he had multiple previous stents in the right coronary artery. The distal LAD was treated with angioplasty. I   CORONARY STENT INTERVENTION N/A 01/03/2023   Procedure: CORONARY STENT INTERVENTION;  Surgeon: Swaziland, Peter M, MD;  Location: [facility] INVASIVE CV LAB;  Service: Cardiovascular;  Laterality: N/A;   LEFT HEART CATH AND CORS/GRAFTS ANGIOGRAPHY N/A 01/03/2023   Procedure: LEFT HEART CATH AND CORS/GRAFTS ANGIOGRAPHY;  Surgeon: Swaziland, Peter M, MD;  Location: [facility] INVASIVE CV LAB;  Service: Cardiovascular;  Laterality: N/A;   LOOP RECORDER IMPLANT N/A 07/12/2014   Procedure: LOOP RECORDER IMPLANT;  Surgeon: Tammie Fall, MD;  Location: [facility] CATH LAB;  Service: Cardiovascular;  Laterality: N/A;   LOWER EXTREMITY ANGIOGRAM  09/21/2014   Procedure: LOWER EXTREMITY ANGIOGRAM;  Surgeon: Richrd Char, MD;  Location: [facility] CATH LAB;  Service: Cardiovascular;;   PERCUTANEOUS CORONARY STENT INTERVENTION (PCI-S)  April 27 2013   right leg amputation  2017   TEE WITHOUT CARDIOVERSION N/A 07/12/2014   Procedure: TRANSESOPHAGEAL ECHOCARDIOGRAM (TEE);  Surgeon: Elmyra Haggard, MD;  Location: [facility] ENDOSCOPY;  Service: Cardiovascular;  Laterality: N/A;   VEIN SURGERY     Patient Active Problem List   Diagnosis Date Noted   Cellulitis and abscess of toe 09/26/2023   Stenosis of carotid artery 05/08/2023   Angina pectoris (HCC) 01/03/2023   Gait abnormality 02/20/2022   Memory deficit 02/20/2022   Right hemiparesis (HCC) 02/20/2022   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) 09/08/2021   Atherosclerosis of native artery of left lower extremity with rest pain (HCC) 09/08/2021   Acquired  absence of unspecified leg above knee (HCC) 11/14/2020   Allergic rhinitis 11/14/2020   Chronic constipation 11/14/2020   Depression 11/14/2020   Diabetic peripheral neuropathy associated with type 2 diabetes mellitus (HCC) 11/14/2020   Dysphagia 11/14/2020   Hyperlipidemia 11/14/2020   Leukocytosis 11/14/2020   Nausea and vomiting 11/14/2020   Presence of aortocoronary bypass graft 11/14/2020   Pure hypercholesterolemia 11/14/2020   Vitamin D  deficiency 11/14/2020   Cryptogenic stroke (HCC) 07/20/2015   History of stroke 07/06/2015   Transient vision disturbance, right 07/06/2015   Atherosclerosis of native arteries of extremity with intermittent claudication (HCC) 09/27/2014   Localization-related symptomatic epilepsy and epileptic syndromes with complex partial seizures, not intractable, without status epilepticus (HCC) 08/04/2014   Occipital stroke (HCC) 08/04/2014   Cerebral thrombosis with cerebral infarction (HCC) 07/10/2014   New onset seizure (HCC) 07/09/2014   Diabetic hyperosmolar non-ketotic state (HCC) 07/09/2014   Essential hypertension 07/09/2014   Polycythemia vera (HCC) 07/09/2014   Seizure (HCC) 07/09/2014   Visual field defect 07/09/2014   Headache 06/30/2014   HTN (hypertension) 11/23/2013   Dyslipidemia 11/23/2013   Myelofibrosis (HCC) 11/22/2013   Diverticular disease 11/22/2013   Hemorrhoids 11/22/2013   S/P CABG x 3 11/22/2013   CAD (coronary artery disease) 11/22/2013   Peripheral vascular disease (HCC) 11/22/2013   DM2 (diabetes mellitus, type 2) (HCC) 11/22/2013   Renal cyst, left 11/22/2013   Splenomegaly 11/22/2013    ONSET DATE: 12/05/2023 (referral)   REFERRING DIAG: R26.9 (ICD-10-CM) - Unspecified abnormalities of gait and mobility (referral)   THERAPY DIAG:  Muscle weakness (generalized)  Unsteadiness on feet  Other abnormalities of gait and mobility  Pain in left foot  Rationale for Evaluation and Treatment:  Rehabilitation  SUBJECTIVE:                                                                                                                                                                                             SUBJECTIVE STATEMENT: Pt presents in [place]. Denies pain. Saw Dr. Alvah Auerbach and toe is okay, was told to use neosporin on  his toe. No falls. Sees [facility] on Thursday.   Pt accompanied by: self  PERTINENT HISTORY: CAD, CHF, DM, HTN, PVD, myelofibrosis, previous CVA (2015), R AKA (2017)   PAIN:  Are you having pain? No Pt reports discomfort in L big toe but denies pain   PRECAUTIONS: Fall and impaired vision   RED FLAGS: None   WEIGHT BEARING RESTRICTIONS: No  FALLS: Has patient fallen in last 6 months? Yes. Number of falls 5-6  LIVING ENVIRONMENT: Lives with: lives alone Lives in: House/apartment Stairs: No Has following equipment at home: Single point cane, Environmental consultant - 2 wheeled, shower chair, Grab bars, and Hoveround  PLOF: Requires assistive device for independence  PATIENT GOALS: "To get better standing and walking"   OBJECTIVE:  Note: Objective measures were completed at Evaluation unless otherwise noted.  DIAGNOSTIC FINDINGS: CT from 11/2015   IMPRESSION: No acute intracranial findings. Normal for age cerebral volume. Remote LEFT occipital CVA.  COGNITION: Overall cognitive status: Difficulty to assess due to: no family present and pt reports he has cognitive issues   SENSATION: Pt w/peripheral neuropathy    EDEMA: Chronic swelling in LLE    POSTURE: increased thoracic kyphosis, flexed trunk , and weight shift right  LOWER EXTREMITY ROM:     Active  Right Eval Left Eval  Hip flexion    Hip extension    Hip abduction    Hip adduction    Hip internal rotation    Hip external rotation    Knee flexion    Knee extension    Ankle dorsiflexion    Ankle plantarflexion    Ankle inversion    Ankle eversion     (Blank rows = not tested)  LOWER  EXTREMITY MMT:  Tested in seated position   MMT Right Eval Left Eval  Hip flexion 4 5  Hip extension    Hip abduction 4 5  Hip adduction 4 5  Hip internal rotation    Hip external rotation    Knee flexion  5  Knee extension  4+  Ankle dorsiflexion  5  Ankle plantarflexion    Ankle inversion    Ankle eversion    (Blank rows = not tested)  BED MOBILITY:  Independent per pt   TRANSFERS: Assistive device utilized: Environmental consultant - 2 wheeled  Sit to stand: SBA Stand to sit: SBA Chair to chair: SBA   GAIT: Gait pattern: step to pattern, decreased step length- Left, decreased stance time- Right, decreased stride length, lateral hip instability, and trunk flexed Distance walked: Various short clinic distances  Assistive device utilized: Walker - 2 wheeled Level of assistance: SBA Comments: Noted increased IR of RLE this date    VITALS  Vitals:   02/03/24 1239 02/03/24 1245 02/03/24 1249 02/03/24 1250  BP: (!) 94/46 (!) 110/53 (!) 117/50 (!) 122/54  Pulse: 88 89 91 92  TREATMENT:   Self-care/home management  Assessed vitals (see above) and BP low. Pt asymptomatic, but provided cup of water and reassessed in seated and standing position. BP stable in standing, so continued with session but informed pt to make therapist aware if he becomes symptomatic.   Physical Performance   AMPUTEE MOBILITY PREDICTOR ASSESSMENT TOOL Initial instructions: Client is seated in a hard chair with arms. The following manoeuvres are tested with or without the use of the prosthesis.  Advise the person of each task or group of tasks prior to performance.  Please avoid unnecessary chatter throughout the test.  Safety First, no task should be performed if either the tester or client is uncertain of a safe outcome.  1. Sitting Balance: Sit forward in a chair with arms folded  across chest for 60s. Cannot sit upright independently for 60s Can sit upright independently for 60s = 0 = 1  1  2. Sitting reach:  Reach forwards and grasp the ruler.  (Tester holds ruler 12in beyond extended arms midline to the sternum) Does not attempt Cannot grasp or requires arm support Reaches forward and successfully grasps item.  = 0 = 1  = 2    2  3. Chair to chair transfer: 2 chairs at 90. Pt. may choose direction and use their upper limbs. Cannot do or requires physical assistance Performs independently, but appears unsteady Performs independently, appears to be steady and safe = 0  = 1 = 2  2  4. Arises from a chair: Ask pt. to fold arms across chest and stand. If unable, use arms or assistive device. Unable without help (physical assistance) Able, uses arms/assist device to help Able, without using arms = 0  = 1 = 2   1  5. Attempts to arise from a chair: (stopwatch ready) If attempt in no. 4. was without arms then ignore and allow another attempt without penalty. Unable without help (physical assistance) Able requires >1 attempt Able to rise one attempt = 0  = 1 = 2   2  6. Immediate Standing Balance: (first 5s) Begin timing immediately. Unsteady (staggers, moves foot, sways ) Steady using walking aid or other support Steady without walker or other support = 0 = 1  = 2  1  7. Standing Balance (30s): (stopwatch ready) For item no.'s 7 & 8, first attempt is without assistive device.  If support is required allow after first attempt Unsteady Steady but uses walking aid or other support Standing without support = 0  = 1 = 2    2  8. Single limb standing balance: (stopwatch ready) Time the duration of single limb standing on both the sound and prosthetic limb up to 30s.   Grade the quality, not the time.  *Eliminate item 8 for AMPnoPRO*  Sound side  30 seconds  Prosthetic side 30 seconds Non-prosthetic side Unsteady Steady but uses  walking aid or other support for 30s Single-limb standing without support for 30s  Prosthetic Side Unsteady Steady but uses walking aid or other support for 30s Single-limb standing without support for 30s  = 0 = 1  = 2    = 0  = 1  = 2    1     1   9. Standing reach: Reach forward and grasp the ruler.  (Tester holds ruler 12in beyond extended arm(s) midline to the sternum) Does not attempt Cannot grasp or requires arm support on assistive device Reaches forward and successfully grasps item no  support = 0  = 1  = 2   1  10. Nudge test: With feet as close together as possible, examiner pushes lightly on pt.'s sternum with palm of hand 3 times (toes should rise) Begins to fall Staggers, grabs, catches self ore uses assistive device Steady = 0  = 1 = 2   2  11. Eyes Closed: (at maximum position #7) If support is required grade as unsteady. Unsteady or grips assistive device Steady without any use of assistive device = 0  = 1  1    12. Pick up objects off the floor: Pick up a pencil off the floor placed midline 12in in front of foot. Unable to pick up object and return to standing Performs with some help (table, chair, walking aid etc) Performs independently (without help) = 0  = 1   = 2  1  13. Sitting down:  Ask pt. to fold arms across chest and sit. If unable, use arm or assistive device. Unsafe (misjudged distance, falls into chair ) Uses arms, assistive device or not a smooth motion Safe, smooth motion = 0  = 1 = 2   1  14. Initiation of gait: (immediately after told to "go") Any hesitancy or multiple attempts to start No hesitancy = 0 = 1  1  15. Step length and height: Walk a measured distance of 61ft twice (up and back). Four scores are required or two scores (a. & b.) for each leg. "Marked deviation" is defined as extreme substitute movements to avoid clearing the floor. a. Swing Foot Does not advance a minimum of 12in Advances a  minimum of 12in  b. Foot Clearance Foot does not completely clear floor without deviation Foot completely clears floor without marked deviation  = 0  = 1   = 0 = 1 Prosthesis 1    1 Sound  1   1  16. Step Continuity Stopping or discontinuity between steps (stop & go gait) Steps appear continuous = 0  = 1 1   17. Turning:  180 degree turn when returning to chair. Unable to turn, requires intervention to prevent falling Greater than three steps but completes task without intervention No more than three continuous steps with or without assistive aid = 0  = 1  = 2   1   18. Variable cadence:  Walk a distance of 87ft fast as possible safely 4 times.  (Speeds may vary from slow to fast and fast to slow varying cadence) Unable to vary cadence in a controlled manner Asymmetrical increase in cadence controlled manner Symmetrical increase in speed in a controlled manner  = 0 = 1 = 2    2    19. Stepping over an obstacle: Place a movable box of 4in in height in the walking path. Cannot step over the box Catches foot, interrupts stride Steps over without interrupting stride = 0 = 1 = 2  1   20. Stairs (must have at least 2 steps):  Try to go up and down these stairs without holding on to the railing.  Don't hesitate to permit pt. to hold on to rail.  Safety First, if examiner feels that any risk in involved omit and score as 0.  Ascending Unsteady, cannot do One step at a time, or must hold on to railing or device Step over step, does not hold onto the railing or device  Descending Unsteady, cannot do One step at a time, or must hold  on to railing or device Step over step, does not hold onto the railing or device  = 0  = 1 = 2    = 0  = 1 = 2 1       1   21. Assistive device selection:  Add points for the use of an assistive device if used for two or more items.  If testing without prosthesis use of appropriate assistive device is mandatory.    Bed bound Wheelchair / Parallel Bars Walker Crutches (axillary or forearm) Cane (straight or quad) None = 0 = 1 = 2 = 3 = 4 = 5    2    Total Score:                             AMPPRO     33  /47     K LEVEL (converted from AMP score)  AMPPRO    K1 = (15-26)      K2 = (27-36)     K3 = (37-42)     K4 = (43-47)   Ther Act  Discussed K Level and plan to fax Amppro to Athena Bland at [place] prior to pt's appointment on Thursday. Pt verbalized understanding.  Pt reports he is using washcloths to cushion his ilium as socket is rubbing against him. Also noted RLE internally rotating on pt during gait. Pt reports he is wearing one sock, unsure which ply. Informed pt to start wearing more socks to reduce pressure on ilium and reduce rotation of socket. Pt verbalized understanding and stated he would wear more socks next session.    PATIENT EDUCATION: Education details: Results of Amppro, wearing more socks  Person educated: Patient Education method: Medical illustrator Education comprehension: verbalized understanding and needs further education  HOME EXERCISE PROGRAM: To be established   GOALS: Goals reviewed with patient? Yes  SHORT TERM GOALS: Target date: 02/05/2024   AMPPRO to be assessed and LTG updated  Baseline: Goal status: MET  2.  Pt will be independent with initial HEP for improved strength, balance, transfers and gait.  Baseline: not established on eval  Goal status: INITIAL  3.  to be assessed and STG/LTG updated  Baseline:  Goal status: INITIAL    LONG TERM GOALS: Target date: 02/19/2024   Pt will improve gait velocity to at least 0.55 m/s w/LRAD for improved gait efficiency and reduced fall risk  Baseline: 0.49 m/s w/RW Goal status: INITIAL  2.  Pt will improve 5 x STS to less than or equal to 16 seconds w/full hip extension to demonstrate improved functional strength and transfer efficiency.   Baseline: 18.15s  Goal status:  INITIAL  3.  AMMPRO goal  Baseline: 33/47 Goal status: DC- pt at max score   4.  goal  Baseline:  Goal status: INITIAL   ASSESSMENT:  CLINICAL IMPRESSION: Session limited as pt's BP low at beginning of session, but pt asymptomatic and did slightly improve following cup of water. Emphasis of skilled PT session on finishing AmpPro and pt education on sock management. Pt scored a 33/47 on AmpPro, indicative of K2 ambulator. Therapist to fax this to prosthetist prior to appointment on Thursday. Pt reports he has been using washcloths to cushion his ilium, as the posterior brim of his prosthetic is rubbing him too much. Informed pt he likely needs to wear more socks, as RLE was internally rotating on him today. Pt reports he will  wear more next session but could benefit from continued education. Continue POC.    OBJECTIVE IMPAIRMENTS: Abnormal gait, cardiopulmonary status limiting activity, decreased activity tolerance, decreased balance, decreased endurance, decreased mobility, difficulty walking, decreased strength, impaired sensation, impaired UE functional use, prosthetic dependency , and pain  ACTIVITY LIMITATIONS: carrying, lifting, transfers, reach over head, and locomotion level  PARTICIPATION LIMITATIONS: meal prep, cleaning, driving, shopping, community activity, and yard work  PERSONAL FACTORS: Age, Fitness, Past/current experiences, and 1-2 comorbidities: previous CVA and R AKA  are also affecting patient's functional outcome.   REHAB POTENTIAL: Good  CLINICAL DECISION MAKING: Evolving/moderate complexity  EVALUATION COMPLEXITY: Moderate  PLAN:  PT FREQUENCY: 2x/week  PT DURATION: 6 weeks  PLANNED INTERVENTIONS: 97164- PT Re-evaluation, 97110-Therapeutic exercises, 97530- Therapeutic activity, 97112- Neuromuscular re-education, 97535- Self Care, 16109- Manual therapy, [id number]- Gait training, [id number]- Prosthetic training, [phone]- Electrical stimulation (manual),  Patient/Family education, Balance training, Stair training, Dry Needling, Joint mobilization, Spinal mobilization, and DME instructions  PLAN FOR NEXT SESSION: Did he wear multiple socks on RLE? and update goals. Establish HEP for improved functional strength and stability when reaching out of BOS. Functional hip stability, single leg stance, endurance     Parrie Rasco E [place], PT, DPT 02/03/2024, 1:21 PM

## 2024-02-05 ENCOUNTER — Ambulatory Visit: Admitting: Physical Therapy

## 2024-02-05 ENCOUNTER — Encounter: Payer: Self-pay | Admitting: Physical Therapy

## 2024-02-05 VITALS — BP 112/45 | HR 81

## 2024-02-05 DIAGNOSIS — M6281 Muscle weakness (generalized): Secondary | ICD-10-CM | POA: Diagnosis not present

## 2024-02-05 DIAGNOSIS — M79672 Pain in left foot: Secondary | ICD-10-CM

## 2024-02-05 DIAGNOSIS — R2689 Other abnormalities of gait and mobility: Secondary | ICD-10-CM

## 2024-02-05 DIAGNOSIS — M542 Cervicalgia: Secondary | ICD-10-CM

## 2024-02-05 DIAGNOSIS — R2681 Unsteadiness on feet: Secondary | ICD-10-CM

## 2024-02-05 NOTE — Therapy (Signed)
 OUTPATIENT PHYSICAL THERAPY NEURO TREATMENT   Patient Name: Tyler Whitehead MRN: [id number] DOB:[birth year], 83 y.o., male Today's Date: 02/05/2024   PCP: Jearldine Mina, MD REFERRING PROVIDER: Jearldine Mina, MD  END OF SESSION:  PT End of Session - 02/05/24 1412     Visit Number 5    Number of Visits 13    Date for PT Re-Evaluation 02/26/24    Authorization Type [employer]    PT Start Time 1405    PT Stop Time 1452    PT Time Calculation (min) 47 min    Equipment Utilized During Treatment Other (comment);Gait belt   R AKA prosthetic   Activity Tolerance Patient tolerated treatment well    Behavior During Therapy WFL for tasks assessed/performed                Past Medical History:  Diagnosis Date   CAD (coronary artery disease)    a. S/p 3V CABG 2000 with LIMA-LAD, left radial-PDA, SVG-OM1. b. 2014: stent to SVG-OM1; then found to have obstruction distal to LAD anastamosis of LIMA s/p DES.    CHF (congestive heart failure) (HCC)    Diverticular disease    DM (diabetes mellitus) (HCC)    Hemorrhoid    HTN (hypertension)    Hyperlipidemia    Peripheral vascular disease (HCC)    a. R femoropopliteal bypass ~2012 in [place], reportedly shown to be occluded. Now followed by Dr. Katheryne Pane.   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CC)    RBBB    Past Surgical History:  Procedure Laterality Date   ABDOMINAL ANGIOGRAM N/A 01/18/2015   Procedure: ABDOMINAL ANGIOGRAM;  Surgeon: Margherita Shell, MD;  Location: [facility] CATH LAB;  Service: Cardiovascular;  Laterality: N/A;   ABDOMINAL AORTAGRAM N/A 09/21/2014   Procedure: ABDOMINAL Tommi Fraise;  Surgeon: Richrd Char, MD;  Location: [facility] CATH LAB;  Service: Cardiovascular;  Laterality: N/A;   ABDOMINAL AORTOGRAM W/LOWER EXTREMITY N/A 03/28/2021   Procedure: ABDOMINAL AORTOGRAM W/LOWER EXTREMITY;  Surgeon: Margherita Shell, MD;  Location: [facility] INVASIVE CV LAB;  Service: Cardiovascular;  Laterality: N/A;   CARDIAC CATHETERIZATION  May 2014   CORONARY  ARTERY BYPASS GRAFT  2000   LM occluded, LIMA to LAD patent but distal native LAD disease, saphenous vein bypass graft to OM with 95% stenosis in the midportion, right coronary artery occluded, radial artery graft to right coronary artery patent but was diffusely small. Marginal graft stented. 7 2014. Of note he had multiple previous stents in the right coronary artery. The distal LAD was treated with angioplasty. I   CORONARY STENT INTERVENTION N/A 01/03/2023   Procedure: CORONARY STENT INTERVENTION;  Surgeon: Swaziland, Peter M, MD;  Location: [facility] INVASIVE CV LAB;  Service: Cardiovascular;  Laterality: N/A;   LEFT HEART CATH AND CORS/GRAFTS ANGIOGRAPHY N/A 01/03/2023   Procedure: LEFT HEART CATH AND CORS/GRAFTS ANGIOGRAPHY;  Surgeon: Swaziland, Peter M, MD;  Location: [facility] INVASIVE CV LAB;  Service: Cardiovascular;  Laterality: N/A;   LOOP RECORDER IMPLANT N/A 07/12/2014   Procedure: LOOP RECORDER IMPLANT;  Surgeon: Tammie Fall, MD;  Location: [facility] CATH LAB;  Service: Cardiovascular;  Laterality: N/A;   LOWER EXTREMITY ANGIOGRAM  09/21/2014   Procedure: LOWER EXTREMITY ANGIOGRAM;  Surgeon: Richrd Char, MD;  Location: [facility] CATH LAB;  Service: Cardiovascular;;   PERCUTANEOUS CORONARY STENT INTERVENTION (PCI-S)  April 27 2013   right leg amputation  2017   TEE WITHOUT CARDIOVERSION N/A 07/12/2014   Procedure: TRANSESOPHAGEAL ECHOCARDIOGRAM (TEE);  Surgeon: Elmyra Haggard, MD;  Location: [facility] ENDOSCOPY;  Service: Cardiovascular;  Laterality: N/A;   VEIN SURGERY     Patient Active Problem List   Diagnosis Date Noted   Cellulitis and abscess of toe 09/26/2023   Stenosis of carotid artery 05/08/2023   Angina pectoris (HCC) 01/03/2023   Gait abnormality 02/20/2022   Memory deficit 02/20/2022   Right hemiparesis (HCC) 02/20/2022   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) 09/08/2021   Atherosclerosis of native artery of left lower extremity with rest pain (HCC) 09/08/2021   Acquired  absence of unspecified leg above knee (HCC) 11/14/2020   Allergic rhinitis 11/14/2020   Chronic constipation 11/14/2020   Depression 11/14/2020   Diabetic peripheral neuropathy associated with type 2 diabetes mellitus (HCC) 11/14/2020   Dysphagia 11/14/2020   Hyperlipidemia 11/14/2020   Leukocytosis 11/14/2020   Nausea and vomiting 11/14/2020   Presence of aortocoronary bypass graft 11/14/2020   Pure hypercholesterolemia 11/14/2020   Vitamin D  deficiency 11/14/2020   Cryptogenic stroke (HCC) 07/20/2015   History of stroke 07/06/2015   Transient vision disturbance, right 07/06/2015   Atherosclerosis of native arteries of extremity with intermittent claudication (HCC) 09/27/2014   Localization-related symptomatic epilepsy and epileptic syndromes with complex partial seizures, not intractable, without status epilepticus (HCC) 08/04/2014   Occipital stroke (HCC) 08/04/2014   Cerebral thrombosis with cerebral infarction (HCC) 07/10/2014   New onset seizure (HCC) 07/09/2014   Diabetic hyperosmolar non-ketotic state (HCC) 07/09/2014   Essential hypertension 07/09/2014   Polycythemia vera (HCC) 07/09/2014   Seizure (HCC) 07/09/2014   Visual field defect 07/09/2014   Headache 06/30/2014   HTN (hypertension) 11/23/2013   Dyslipidemia 11/23/2013   Myelofibrosis (HCC) 11/22/2013   Diverticular disease 11/22/2013   Hemorrhoids 11/22/2013   S/P CABG x 3 11/22/2013   CAD (coronary artery disease) 11/22/2013   Peripheral vascular disease (HCC) 11/22/2013   DM2 (diabetes mellitus, type 2) (HCC) 11/22/2013   Renal cyst, left 11/22/2013   Splenomegaly 11/22/2013    ONSET DATE: 12/05/2023 (referral)   REFERRING DIAG: R26.9 (ICD-10-CM) - Unspecified abnormalities of gait and mobility (referral)   THERAPY DIAG:  Muscle weakness (generalized)  Other abnormalities of gait and mobility  Pain in left foot  Unsteadiness on feet  Cervicalgia  Rationale for Evaluation and Treatment:  Rehabilitation  SUBJECTIVE:                                                                                                                                                                                             SUBJECTIVE STATEMENT: Pt presents in [place]. Denies pain.  No falls. Sees [facility] on Thursday May 1.  Pt accompanied by: self  PERTINENT HISTORY: CAD, CHF, DM, HTN, PVD, myelofibrosis, previous CVA (2015), R AKA (2017)   PAIN:  Are you having pain? No Pt reports discomfort in L big toe but denies pain at current.  PRECAUTIONS: Fall and impaired vision   RED FLAGS: None   WEIGHT BEARING RESTRICTIONS: No  FALLS: Has patient fallen in last 6 months? Yes. Number of falls 5-6  LIVING ENVIRONMENT: Lives with: lives alone Lives in: House/apartment Stairs: No Has following equipment at home: Single point cane, Environmental consultant - 2 wheeled, shower chair, Grab bars, and Hoveround  PLOF: Requires assistive device for independence  PATIENT GOALS: "To get better standing and walking"   OBJECTIVE:  Note: Objective measures were completed at Evaluation unless otherwise noted.  DIAGNOSTIC FINDINGS: CT from 11/2015   IMPRESSION: No acute intracranial findings. Normal for age cerebral volume. Remote LEFT occipital CVA.  COGNITION: Overall cognitive status: Difficulty to assess due to: no family present and pt reports he has cognitive issues   SENSATION: Pt w/peripheral neuropathy    EDEMA: Chronic swelling in LLE    POSTURE: increased thoracic kyphosis, flexed trunk , and weight shift right  LOWER EXTREMITY ROM:     Active  Right Eval Left Eval  Hip flexion    Hip extension    Hip abduction    Hip adduction    Hip internal rotation    Hip external rotation    Knee flexion    Knee extension    Ankle dorsiflexion    Ankle plantarflexion    Ankle inversion    Ankle eversion     (Blank rows = not tested)  LOWER EXTREMITY MMT:  Tested in seated position   MMT  Right Eval Left Eval  Hip flexion 4 5  Hip extension    Hip abduction 4 5  Hip adduction 4 5  Hip internal rotation    Hip external rotation    Knee flexion  5  Knee extension  4+  Ankle dorsiflexion  5  Ankle plantarflexion    Ankle inversion    Ankle eversion    (Blank rows = not tested)  BED MOBILITY:  Independent per pt   TRANSFERS: Assistive device utilized: Environmental consultant - 2 wheeled  Sit to stand: SBA Stand to sit: SBA Chair to chair: SBA   GAIT: Gait pattern: step to pattern, decreased step length- Left, decreased stance time- Right, decreased stride length, lateral hip instability, and trunk flexed Distance walked: Various short clinic distances  Assistive device utilized: Walker - 2 wheeled Level of assistance: SBA Comments: Noted increased IR of RLE this date    VITALS  Vitals:   02/05/24 1416  BP: (!) 112/45  Pulse: 81  SpO2: 97%                                                                                                                                  TREATMENT:  Self-care/home management  Assessed vitals (see above) and BP low. Pt feels very tired today and requests cup of water and reassessed in seated and standing position. BP stable in standing, so continued with session but informed pt to make therapist aware if he becomes symptomatic.  Discussed socks he has donned today - he is unsure of the ply as he usually puts 2 socks on and leaves it this way.  PT provided edu on locating ply marking on socks - he was not able to doff prosthetic due to bottoms to allow PT to locate in clinic. Discussed his knee component (single axis mechanical knee) in order for him to discuss with prosthetist tomorrow.  He states this component buckles and he would like more stability and balance.  Discussed K level and relevance to prosthetic componentry and possible changes. Reassessed BP in sitting 108/47, 77 bpm; standing 133/63  TA: Transferred stand step to right CGA  w/ single buckle on RLE in SLS resulting in uncontrolled sit to mat table, pt states this happens often at home.  Supine bridges 2x15 Side-lying clamshells 2x20 each side, pt has great form, using prosthetic donned for resistance, mild pain in right hip when doing right clamshells resolved w/ brief rest Prone hip flexor stretch, time spent adjusting into position to improve comfort and deepen stretch, x4 minutes  PATIENT EDUCATION: Education details: K level, note to [facility], wearing more socks, safety using AD when walking at home Person educated: Patient Education method: Medical illustrator Education comprehension: verbalized understanding and needs further education  HOME EXERCISE PROGRAM: To be established   GOALS: Goals reviewed with patient? Yes  SHORT TERM GOALS: Target date: 02/05/2024   AMPPRO to be assessed and LTG updated  Baseline: Goal status: MET  2.  Pt will be independent with initial HEP for improved strength, balance, transfers and gait.  Baseline: not established on eval  Goal status: INITIAL  3.  to be assessed and STG/LTG updated  Baseline:  Goal status: INITIAL    LONG TERM GOALS: Target date: 02/19/2024   Pt will improve gait velocity to at least 0.55 m/s w/LRAD for improved gait efficiency and reduced fall risk  Baseline: 0.49 m/s w/RW Goal status: INITIAL  2.  Pt will improve 5 x STS to less than or equal to 16 seconds w/full hip extension to demonstrate improved functional strength and transfer efficiency.   Baseline: 18.15s  Goal status: INITIAL  3.  AMMPRO goal  Baseline: 33/47 Goal status: DC- pt at max score   4.  goal  Baseline:  Goal status: INITIAL   ASSESSMENT:  CLINICAL IMPRESSION: Patient presents for subsequent treatment session without change in status.  He maintains his [facility] appt for 5/1 and PT provided a note to assist with input for prosthetic knee component to better support transfers and other  functional tasks in standing.  He had an instance of prosthetic knee buckle without PT noting significant trunk instability, poor foot or hip alignment in standing.  He does well with prone positioning today noting mild stretch in hip flexors.  Will continue to work on posterior chain strengthening to maintain positioning of shortened residual limb.  Continue per POC.   OBJECTIVE IMPAIRMENTS: Abnormal gait, cardiopulmonary status limiting activity, decreased activity tolerance, decreased balance, decreased endurance, decreased mobility, difficulty walking, decreased strength, impaired sensation, impaired UE functional use, prosthetic dependency , and pain  ACTIVITY LIMITATIONS: carrying, lifting, transfers, reach over head, and locomotion level  PARTICIPATION LIMITATIONS: meal  prep, cleaning, driving, shopping, community activity, and yard work  PERSONAL FACTORS: Age, Fitness, Past/current experiences, and 1-2 comorbidities: previous CVA and R AKA  are also affecting patient's functional outcome.   REHAB POTENTIAL: Good  CLINICAL DECISION MAKING: Evolving/moderate complexity  EVALUATION COMPLEXITY: Moderate  PLAN:  PT FREQUENCY: 2x/week  PT DURATION: 6 weeks  PLANNED INTERVENTIONS: 97164- PT Re-evaluation, 97110-Therapeutic exercises, 97530- Therapeutic activity, 97112- Neuromuscular re-education, 97535- Self Care, [address]- Manual therapy, [phone]- Gait training, [phone]- Prosthetic training, [phone]- Electrical stimulation (manual), Patient/Family education, Balance training, Stair training, Dry Needling, Joint mobilization, Spinal mobilization, and DME instructions  PLAN FOR NEXT SESSION: Did he wear multiple socks on RLE -does he know ply? and update goals. Establish HEP for improved functional strength and stability when reaching out of BOS. Functional hip stability, single leg stance, endurance, continued posterior chain strengthening.  Changes to Prosthetic Wava Hagedorn)?    Earlean Glaze, PT, DPT 02/05/2024, 3:21 PM

## 2024-02-05 NOTE — Patient Instructions (Signed)
 NOTE TO [facility] PER PATIENT REQUEST Tyler, Whitehead has been seen for PT regarding management of his standing and balance with right transfemoral prosthesis.  He performs at a high K2 level based on AMPPRO scoring and would likely benefit from a knee component offering more stance control as he reports instability in current component.  If you would, please assess and offer alternative options for improved prosthetic componentry to meet current function.    Thank you, Marilou Showman, PT, DPT Burleigh Carp Plaster, PT, DPT

## 2024-02-07 ENCOUNTER — Other Ambulatory Visit: Payer: Self-pay

## 2024-02-07 DIAGNOSIS — D7581 Myelofibrosis: Secondary | ICD-10-CM

## 2024-02-10 ENCOUNTER — Inpatient Hospital Stay: Payer: No Typology Code available for payment source | Attending: Hematology

## 2024-02-10 DIAGNOSIS — D471 Chronic myeloproliferative disease: Secondary | ICD-10-CM | POA: Diagnosis not present

## 2024-02-10 DIAGNOSIS — D7581 Myelofibrosis: Secondary | ICD-10-CM | POA: Insufficient documentation

## 2024-02-10 LAB — CBC WITH DIFFERENTIAL (CANCER CENTER ONLY)
Abs Immature Granulocytes: 0.09 10*3/uL — ABNORMAL HIGH (ref 0.00–0.07)
Basophils Absolute: 0.2 10*3/uL — ABNORMAL HIGH (ref 0.0–0.1)
Basophils Relative: 1 %
Eosinophils Absolute: 0.4 10*3/uL (ref 0.0–0.5)
Eosinophils Relative: 3 %
HCT: 31.8 % — ABNORMAL LOW (ref 39.0–52.0)
Hemoglobin: 10.5 g/dL — ABNORMAL LOW (ref 13.0–17.0)
Immature Granulocytes: 1 %
Lymphocytes Relative: 5 %
Lymphs Abs: 0.8 10*3/uL (ref 0.7–4.0)
MCH: 27.5 pg (ref 26.0–34.0)
MCHC: 33 g/dL (ref 30.0–36.0)
MCV: 83.2 fL (ref 80.0–100.0)
Monocytes Absolute: 0.2 10*3/uL (ref 0.1–1.0)
Monocytes Relative: 2 %
Neutro Abs: 13.2 10*3/uL — ABNORMAL HIGH (ref 1.7–7.7)
Neutrophils Relative %: 88 %
Platelet Count: 518 10*3/uL — ABNORMAL HIGH (ref 150–400)
RBC: 3.82 MIL/uL — ABNORMAL LOW (ref 4.22–5.81)
RDW: 21.6 % — ABNORMAL HIGH (ref 11.5–15.5)
WBC Count: 14.8 10*3/uL — ABNORMAL HIGH (ref 4.0–10.5)
nRBC: 0.3 % — ABNORMAL HIGH (ref 0.0–0.2)

## 2024-02-10 LAB — CMP (CANCER CENTER ONLY)
ALT: 6 U/L (ref 0–44)
AST: 12 U/L — ABNORMAL LOW (ref 15–41)
Albumin: 3.6 g/dL (ref 3.5–5.0)
Alkaline Phosphatase: 93 U/L (ref 38–126)
Anion gap: 3 — ABNORMAL LOW (ref 5–15)
BUN: 19 mg/dL (ref 8–23)
CO2: 28 mmol/L (ref 22–32)
Calcium: 8.2 mg/dL — ABNORMAL LOW (ref 8.9–10.3)
Chloride: 103 mmol/L (ref 98–111)
Creatinine: 1.14 mg/dL (ref 0.61–1.24)
GFR, Estimated: >60 mL/min (ref >60)
Glucose, Bld: 211 mg/dL — ABNORMAL HIGH (ref 70–99)
Potassium: 4.5 mmol/L (ref 3.5–5.1)
Sodium: 134 mmol/L — ABNORMAL LOW (ref 135–145)
Total Bilirubin: 0.6 mg/dL (ref 0.0–1.2)
Total Protein: 5.6 g/dL — ABNORMAL LOW (ref 6.5–8.1)

## 2024-02-11 ENCOUNTER — Telehealth: Payer: Self-pay

## 2024-02-11 ENCOUNTER — Ambulatory Visit: Attending: Internal Medicine | Admitting: Physical Therapy

## 2024-02-11 ENCOUNTER — Encounter: Payer: Self-pay | Admitting: Physical Therapy

## 2024-02-11 ENCOUNTER — Telehealth: Payer: Self-pay | Admitting: Physical Therapy

## 2024-02-11 VITALS — BP 124/57 | HR 93

## 2024-02-11 DIAGNOSIS — R2681 Unsteadiness on feet: Secondary | ICD-10-CM | POA: Diagnosis not present

## 2024-02-11 DIAGNOSIS — R2689 Other abnormalities of gait and mobility: Secondary | ICD-10-CM | POA: Insufficient documentation

## 2024-02-11 DIAGNOSIS — M6281 Muscle weakness (generalized): Secondary | ICD-10-CM | POA: Diagnosis not present

## 2024-02-11 DIAGNOSIS — M79672 Pain in left foot: Secondary | ICD-10-CM | POA: Insufficient documentation

## 2024-02-11 NOTE — Telephone Encounter (Signed)
 Dr. Husain,  Neldon Baltimore  was treated by PT on 02/11/2024.  The patient would benefit from home health PT evaluation for debility and deficits related to R AKA.   If you agree, please place an order.  Thank you,  Marilou Showman, PT, DPT  [facility] [address] [place], [place]  [address] Phone:  [phone] Fax:  [phone]

## 2024-02-11 NOTE — Telephone Encounter (Addendum)
 Called patient to relay message below as per Dr. Maryalice Smaller, Patient is taking hydrea  as ordered, patient voiced full understanding and had no further questions at this time.   ----- Message from Sonja Franklin sent at 02/11/2024  7:24 AM EDT ----- Please let pt know his lab result, plt slightly high, make sure he is compliant with hydrea , and schedule lab in 6 weeks for monitoring, thx   Sonja [place]

## 2024-02-11 NOTE — Therapy (Signed)
 OUTPATIENT PHYSICAL THERAPY NEURO TREATMENT - DISCHARGE SUMMARY   Patient Name: Tyler Whitehead MRN: [id number] DOB:Jun 11, 1941, 83 y.o., male Today's Date: 02/11/2024   PCP: Jearldine Mina, MD REFERRING PROVIDER: Jearldine Mina, MD  PHYSICAL THERAPY DISCHARGE SUMMARY  Visits from Start of Care: 6  Current functional level related to goals / functional outcomes: See clinical impression statement.   Remaining deficits: Needs updates to R AKA prosthetic, fall risk due to current prosthetic, BP fluctuation, low vision impairing balance   Education / Equipment: Extensive education on process for transitioning to HHPT, follow-up with [facility] and medical follow-ups for visual issues, use of AD until prosthetic upgrades made if able to be made, discussing a caregiver through insurance with PCP, other therapeutic services to maintain independence.  Discharge from OPPT today.   Patient agrees to discharge. Patient goals were not met. Patient is being discharged due to the patient's request.   END OF SESSION:  PT End of Session - 02/11/24 1206     Visit Number 6    Number of Visits 13    Date for PT Re-Evaluation 02/26/24    Authorization Type [employer]    PT Start Time 1154    PT Stop Time 1234    PT Time Calculation (min) 40 min    Equipment Utilized During Treatment Other (comment);Gait belt   R AKA prosthetic   Activity Tolerance Patient tolerated treatment well    Behavior During Therapy WFL for tasks assessed/performed                Past Medical History:  Diagnosis Date   CAD (coronary artery disease)    a. S/p 3V CABG 2000 with LIMA-LAD, left radial-PDA, SVG-OM1. b. 2014: stent to SVG-OM1; then found to have obstruction distal to LAD anastamosis of LIMA s/p DES.    CHF (congestive heart failure) (HCC)    Diverticular disease    DM (diabetes mellitus) (HCC)    Hemorrhoid    HTN (hypertension)    Hyperlipidemia    Peripheral vascular disease (HCC)    a. R  femoropopliteal bypass ~2012 in [place], reportedly shown to be occluded. Now followed by Dr. Katheryne Pane.   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CC)    RBBB    Past Surgical History:  Procedure Laterality Date   ABDOMINAL ANGIOGRAM N/A 01/18/2015   Procedure: ABDOMINAL ANGIOGRAM;  Surgeon: Margherita Shell, MD;  Location: [facility] CATH LAB;  Service: Cardiovascular;  Laterality: N/A;   ABDOMINAL AORTAGRAM N/A 09/21/2014   Procedure: ABDOMINAL Tommi Fraise;  Surgeon: Richrd Char, MD;  Location: [facility] CATH LAB;  Service: Cardiovascular;  Laterality: N/A;   ABDOMINAL AORTOGRAM W/LOWER EXTREMITY N/A 03/28/2021   Procedure: ABDOMINAL AORTOGRAM W/LOWER EXTREMITY;  Surgeon: Margherita Shell, MD;  Location: [facility] INVASIVE CV LAB;  Service: Cardiovascular;  Laterality: N/A;   CARDIAC CATHETERIZATION  May 2014   CORONARY ARTERY BYPASS GRAFT  2000   LM occluded, LIMA to LAD patent but distal native LAD disease, saphenous vein bypass graft to OM with 95% stenosis in the midportion, right coronary artery occluded, radial artery graft to right coronary artery patent but was diffusely small. Marginal graft stented. 7 2014. Of note he had multiple previous stents in the right coronary artery. The distal LAD was treated with angioplasty. I   CORONARY STENT INTERVENTION N/A 01/03/2023   Procedure: CORONARY STENT INTERVENTION;  Surgeon: Swaziland, Peter M, MD;  Location[facility] INVASIVE CV LAB;  Service: Cardiovascular;  Laterality: N/A;   LEFT HEART CATH AND CORS/GRAFTS ANGIOGRAPHY  N/A 01/03/2023   Procedure: LEFT HEART CATH AND CORS/GRAFTS ANGIOGRAPHY;  Surgeon: Swaziland, Peter M, MD;  Location: [facility] INVASIVE CV LAB;  Service: Cardiovascular;  Laterality: N/A;   LOOP RECORDER IMPLANT N/A 07/12/2014   Procedure: LOOP RECORDER IMPLANT;  Surgeon: Tammie Fall, MD;  Location: [facility] CATH LAB;  Service: Cardiovascular;  Laterality: N/A;   LOWER EXTREMITY ANGIOGRAM  09/21/2014   Procedure: LOWER EXTREMITY ANGIOGRAM;  Surgeon: Richrd Char, MD;  Location: [facility] CATH  LAB;  Service: Cardiovascular;;   PERCUTANEOUS CORONARY STENT INTERVENTION (PCI-S)  April 27 2013   right leg amputation  2017   TEE WITHOUT CARDIOVERSION N/A 07/12/2014   Procedure: TRANSESOPHAGEAL ECHOCARDIOGRAM (TEE);  Surgeon: Elmyra Haggard, MD;  Location: [facility] ENDOSCOPY;  Service: Cardiovascular;  Laterality: N/A;   VEIN SURGERY     Patient Active Problem List   Diagnosis Date Noted   Cellulitis and abscess of toe 09/26/2023   Stenosis of carotid artery 05/08/2023   Angina pectoris (HCC) 01/03/2023   Gait abnormality 02/20/2022   Memory deficit 02/20/2022   Right hemiparesis (HCC) 02/20/2022   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) 09/08/2021   Atherosclerosis of native artery of left lower extremity with rest pain (HCC) 09/08/2021   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 (HCC) 11/14/2020   Allergic rhinitis 11/14/2020   Chronic constipation 11/14/2020   Depression 11/14/2020   Diabetic peripheral neuropathy associated with type 2 diabetes mellitus (HCC) 11/14/2020   Dysphagia 11/14/2020   Hyperlipidemia 11/14/2020   Leukocytosis 11/14/2020   Nausea and vomiting 11/14/2020   Presence of aortocoronary bypass graft 11/14/2020   Pure hypercholesterolemia 11/14/2020   Vitamin D  deficiency 11/14/2020   Cryptogenic stroke (HCC) 07/20/2015   History of stroke 07/06/2015   Transient vision disturbance, right 07/06/2015   Atherosclerosis of native arteries of extremity with intermittent claudication (HCC) 09/27/2014   Localization-related symptomatic epilepsy and epileptic syndromes with complex partial seizures, not intractable, without status epilepticus (HCC) 08/04/2014   Occipital stroke (HCC) 08/04/2014   Cerebral thrombosis with cerebral infarction (HCC) 07/10/2014   New onset seizure (HCC) 07/09/2014   Diabetic hyperosmolar non-ketotic state (HCC) 07/09/2014   Essential hypertension 07/09/2014   Polycythemia vera (HCC) 07/09/2014   Seizure (HCC)  07/09/2014   Visual field defect 07/09/2014   Headache 06/30/2014   HTN (hypertension) 11/23/2013   Dyslipidemia 11/23/2013   Myelofibrosis (HCC) 11/22/2013   Diverticular disease 11/22/2013   Hemorrhoids 11/22/2013   S/P CABG x 3 11/22/2013   CAD (coronary artery disease) 11/22/2013   Peripheral vascular disease (HCC) 11/22/2013   DM2 (diabetes mellitus, type 2) (HCC) 11/22/2013   Renal cyst, left 11/22/2013   Splenomegaly 11/22/2013    ONSET DATE: 12/05/2023 (referral)   REFERRING DIAG: R26.9 (ICD-10-CM) - Unspecified abnormalities of gait and mobility (referral)   THERAPY DIAG:  Muscle weakness (generalized)  Other abnormalities of gait and mobility  Pain in left foot  Unsteadiness on feet  Rationale for Evaluation and Treatment: Rehabilitation  SUBJECTIVE:  SUBJECTIVE STATEMENT: Pt presents in [place]. Denies pain, he just feels very tired.  His BP was low this morning.  No falls.  Was supposed to see [facility] on Thursday May 1, but missed it due to the bus.  He feels he is overwhelmed with managing transportation for all of his appts and inquires about transitioning back to HHPT.   Pt accompanied by: self  PERTINENT HISTORY: CAD, CHF, DM, HTN, PVD, myelofibrosis, previous CVA (2015), R AKA (2017)   PAIN:  Are you having pain? No Pt reports discomfort in L big toe but denies pain at current.  PRECAUTIONS: Fall and impaired vision   RED FLAGS: None   WEIGHT BEARING RESTRICTIONS: No  FALLS: Has patient fallen in last 6 months? Yes. Number of falls 5-6  LIVING ENVIRONMENT: Lives with: lives alone Lives in: House/apartment Stairs: No Has following equipment at home: Single point cane, Environmental consultant - 2 wheeled, shower chair, Grab bars, and [place]  PLOF: Requires assistive device  for independence  PATIENT GOALS: "To get better standing and walking"   OBJECTIVE:  Note: Objective measures were completed at Evaluation unless otherwise noted.  DIAGNOSTIC FINDINGS: CT from 11/2015   IMPRESSION: No acute intracranial findings. Normal for age cerebral volume. Remote LEFT occipital CVA.  COGNITION: Overall cognitive status: Difficulty to assess due to: no family present and pt reports he has cognitive issues   SENSATION: Pt w/peripheral neuropathy    EDEMA: Chronic swelling in LLE    POSTURE: increased thoracic kyphosis, flexed trunk , and weight shift right  LOWER EXTREMITY ROM:     Active  Right Eval Left Eval  Hip flexion    Hip extension    Hip abduction    Hip adduction    Hip internal rotation    Hip external rotation    Knee flexion    Knee extension    Ankle dorsiflexion    Ankle plantarflexion    Ankle inversion    Ankle eversion     (Blank rows = not tested)  LOWER EXTREMITY MMT:  Tested in seated position   MMT Right Eval Left Eval  Hip flexion 4 5  Hip extension    Hip abduction 4 5  Hip adduction 4 5  Hip internal rotation    Hip external rotation    Knee flexion  5  Knee extension  4+  Ankle dorsiflexion  5  Ankle plantarflexion    Ankle inversion    Ankle eversion    (Blank rows = not tested)  BED MOBILITY:  Independent per pt   TRANSFERS: Assistive device utilized: Environmental consultant - 2 wheeled  Sit to stand: SBA Stand to sit: SBA Chair to chair: SBA   GAIT: Gait pattern: step to pattern, decreased step length- Left, decreased stance time- Right, decreased stride length, lateral hip instability, and trunk flexed Distance walked: Various short clinic distances  Assistive device utilized: Walker - 2 wheeled Level of assistance: SBA Comments: Noted increased IR of RLE this date    VITALS  Vitals:   02/11/24 1203  BP: (!) 124/57  Pulse: 93  TREATMENT:   Self-care/home management  Assessed vitals (see above) and BP good for PT.  Discussed socks he has donned today - he is unsure of the ply as he usually puts 2 socks on and leaves it this way as he did today.  He did not bring sock example and does not wish to doff prosthetic today. Re-printed his note (also one large font for pt to keep for reference) to take to [facility] as he is unsure if he still has.  Re-iterated knee component issues and things to discuss to see if [facility] can improve his prosthesis.   He verbalized need to work on [employer], but does not wish to pursue SLP until PT completed.  Discussed he might want to try [facility] then return to PT so that he has a chance to address bothersome loss of train of thought. Discussed process for transitioning to HHPT services and likely timeline - he is agreeable to discharge from OPPT today. Provided therapeutic listening and encouragement due to pt concern for "what's coming" with his vision.  He reports concern over seeing "red ants" that are not really there when he is tired and at other random times.  Discussed monitoring his BP during these episodes and logging times and symptoms when he experiences this.  He says he knows they are not really there due to him moving to touch them and nothing happens.  Encouraged follow-up with PCP and ophthalmology.  Discussed visual status change and OT low vision evaluation in future.  TA: Goal assessment, see LTGs below. = 21.09 sec = 0.47 m/sec OR 1.56 ft/sec w/ 2WW SBA  PATIENT EDUCATION: Education details:  Plan for discharge and all other edu in self-care section above. Person educated: Patient Education method: Medical illustrator Education comprehension: verbalized understanding and needs further education  HOME EXERCISE PROGRAM: To be established   GOALS: Goals reviewed with patient? Yes  SHORT TERM  GOALS: Target date: 02/05/2024   AMPPRO to be assessed and LTG updated  Baseline: Goal status: MET  2.  Pt will be independent with initial HEP for improved strength, balance, transfers and gait.  Baseline: not established on eval  Goal status: INITIAL  3.  to be assessed and STG/LTG updated  Baseline:  Goal status: INITIAL    LONG TERM GOALS: Target date: 02/19/2024   Pt will improve gait velocity to at least 0.55 m/s w/LRAD for improved gait efficiency and reduced fall risk  Baseline: 0.49 m/s w/RW; 0.47 m/sec w/RW (5/6) Goal status: NOT MET  2.  Pt will improve 5 x STS to less than or equal to 16 seconds w/full hip extension to demonstrate improved functional strength and transfer efficiency.   Baseline: 18.15s; 17.59 seconds (5/6) Goal status: PARTIALLY MET  3.  AMMPRO goal  Baseline: 33/47 Goal status: DC- pt at max score   4.  goal  Baseline: Never assessed. Goal status: DC   ASSESSMENT:  CLINICAL IMPRESSION: Patient presents for treatment today having not had [facility] follow-up due to transportation issues.  He would like to cancel last appt here so he can reschedule [facility] visit and discharge in favor of HHPT services as transportation is contributing to his stress managing his medical appts.  He requires therapeutic listening, encouragement, and extensive education on management of ongoing deficits at home.  PT to discharge w/ goals unmet.    OBJECTIVE IMPAIRMENTS: Abnormal gait, cardiopulmonary status limiting activity, decreased activity tolerance, decreased balance, decreased endurance, decreased mobility,  difficulty walking, decreased strength, impaired sensation, impaired UE functional use, prosthetic dependency , and pain  ACTIVITY LIMITATIONS: carrying, lifting, transfers, reach over head, and locomotion level  PARTICIPATION LIMITATIONS: meal prep, cleaning, driving, shopping, community activity, and yard work  PERSONAL FACTORS: Age, Fitness,  Past/current experiences, and 1-2 comorbidities: previous CVA and R AKA  are also affecting patient's functional outcome.   REHAB POTENTIAL: Good  CLINICAL DECISION MAKING: Evolving/moderate complexity  EVALUATION COMPLEXITY: Moderate  PLAN:  PT FREQUENCY: 2x/week  PT DURATION: 6 weeks  PLANNED INTERVENTIONS: 97164- PT Re-evaluation, 97110-Therapeutic exercises, 97530- Therapeutic activity, 97112- Neuromuscular re-education, 97535- Self Care, 21308- Manual therapy, [phone]- Gait training, [phone]- Prosthetic training, [phone]- Electrical stimulation (manual), Patient/Family education, Balance training, Stair training, Dry Needling, Joint mobilization, Spinal mobilization, and DME instructions  PLAN FOR NEXT SESSION: N/A    Earlean Glaze, PT, DPT 02/11/2024, 12:49 PM

## 2024-02-13 ENCOUNTER — Ambulatory Visit: Admitting: Physical Therapy

## 2024-03-07 DIAGNOSIS — N182 Chronic kidney disease, stage 2 (mild): Secondary | ICD-10-CM | POA: Diagnosis not present

## 2024-03-07 DIAGNOSIS — I10 Essential (primary) hypertension: Secondary | ICD-10-CM | POA: Diagnosis not present

## 2024-03-07 DIAGNOSIS — E1140 Type 2 diabetes mellitus with diabetic neuropathy, unspecified: Secondary | ICD-10-CM | POA: Diagnosis not present

## 2024-03-07 DIAGNOSIS — E785 Hyperlipidemia, unspecified: Secondary | ICD-10-CM | POA: Diagnosis not present

## 2024-03-13 ENCOUNTER — Other Ambulatory Visit: Payer: Self-pay

## 2024-03-13 MED ORDER — ISOSORBIDE MONONITRATE ER 60 MG PO TB24
120.0000 mg | ORAL_TABLET | Freq: Every day | ORAL | 2 refills | Status: AC
Start: 2024-03-13 — End: ?

## 2024-03-16 ENCOUNTER — Other Ambulatory Visit: Payer: Self-pay | Admitting: Hematology

## 2024-03-19 ENCOUNTER — Other Ambulatory Visit: Payer: Self-pay

## 2024-03-20 ENCOUNTER — Other Ambulatory Visit: Payer: Self-pay

## 2024-03-20 DIAGNOSIS — D7581 Myelofibrosis: Secondary | ICD-10-CM

## 2024-03-20 DIAGNOSIS — I25118 Atherosclerotic heart disease of native coronary artery with other forms of angina pectoris: Secondary | ICD-10-CM

## 2024-03-23 ENCOUNTER — Inpatient Hospital Stay: Attending: Hematology

## 2024-03-23 DIAGNOSIS — D471 Chronic myeloproliferative disease: Secondary | ICD-10-CM | POA: Insufficient documentation

## 2024-03-23 DIAGNOSIS — D7581 Myelofibrosis: Secondary | ICD-10-CM

## 2024-03-23 DIAGNOSIS — I25118 Atherosclerotic heart disease of native coronary artery with other forms of angina pectoris: Secondary | ICD-10-CM

## 2024-03-23 LAB — CBC WITH DIFFERENTIAL (CANCER CENTER ONLY)
Abs Immature Granulocytes: 0.03 10*3/uL (ref 0.00–0.07)
Basophils Absolute: 0.1 10*3/uL (ref 0.0–0.1)
Basophils Relative: 2 %
Eosinophils Absolute: 0.1 10*3/uL (ref 0.0–0.5)
Eosinophils Relative: 2 %
HCT: 29.9 % — ABNORMAL LOW (ref 39.0–52.0)
Hemoglobin: 10 g/dL — ABNORMAL LOW (ref 13.0–17.0)
Immature Granulocytes: 1 %
Lymphocytes Relative: 13 %
Lymphs Abs: 0.7 10*3/uL (ref 0.7–4.0)
MCH: 28.2 pg (ref 26.0–34.0)
MCHC: 33.4 g/dL (ref 30.0–36.0)
MCV: 84.2 fL (ref 80.0–100.0)
Monocytes Absolute: 0.1 10*3/uL (ref 0.1–1.0)
Monocytes Relative: 2 %
Neutro Abs: 4.2 10*3/uL (ref 1.7–7.7)
Neutrophils Relative %: 80 %
Platelet Count: 185 10*3/uL (ref 150–400)
RBC: 3.55 MIL/uL — ABNORMAL LOW (ref 4.22–5.81)
RDW: 22.8 % — ABNORMAL HIGH (ref 11.5–15.5)
WBC Count: 5.2 10*3/uL (ref 4.0–10.5)
nRBC: 0 % (ref 0.0–0.2)

## 2024-03-23 LAB — CMP (CANCER CENTER ONLY)
ALT: 6 U/L (ref 0–44)
AST: 11 U/L — ABNORMAL LOW (ref 15–41)
Albumin: 4 g/dL (ref 3.5–5.0)
Alkaline Phosphatase: 75 U/L (ref 38–126)
Anion gap: 6 (ref 5–15)
BUN: 18 mg/dL (ref 8–23)
CO2: 27 mmol/L (ref 22–32)
Calcium: 8.8 mg/dL — ABNORMAL LOW (ref 8.9–10.3)
Chloride: 103 mmol/L (ref 98–111)
Creatinine: 0.98 mg/dL (ref 0.61–1.24)
GFR, Estimated: >60 mL/min (ref >60)
Glucose, Bld: 114 mg/dL — ABNORMAL HIGH (ref 70–99)
Potassium: 4.4 mmol/L (ref 3.5–5.1)
Sodium: 136 mmol/L (ref 135–145)
Total Bilirubin: 0.6 mg/dL (ref 0.0–1.2)
Total Protein: 6.3 g/dL — ABNORMAL LOW (ref 6.5–8.1)

## 2024-03-31 DIAGNOSIS — E1140 Type 2 diabetes mellitus with diabetic neuropathy, unspecified: Secondary | ICD-10-CM | POA: Diagnosis not present

## 2024-03-31 DIAGNOSIS — N182 Chronic kidney disease, stage 2 (mild): Secondary | ICD-10-CM | POA: Diagnosis not present

## 2024-03-31 DIAGNOSIS — Z8673 Personal history of transient ischemic attack (TIA), and cerebral infarction without residual deficits: Secondary | ICD-10-CM | POA: Diagnosis not present

## 2024-03-31 DIAGNOSIS — G8190 Hemiplegia, unspecified affecting unspecified side: Secondary | ICD-10-CM | POA: Diagnosis not present

## 2024-03-31 DIAGNOSIS — I25709 Atherosclerosis of coronary artery bypass graft(s), unspecified, with unspecified angina pectoris: Secondary | ICD-10-CM | POA: Diagnosis not present

## 2024-03-31 DIAGNOSIS — Z89619 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: Secondary | ICD-10-CM | POA: Diagnosis not present

## 2024-03-31 DIAGNOSIS — E785 Hyperlipidemia, unspecified: Secondary | ICD-10-CM | POA: Diagnosis not present

## 2024-03-31 DIAGNOSIS — I739 Peripheral vascular disease, unspecified: Secondary | ICD-10-CM | POA: Diagnosis not present

## 2024-03-31 DIAGNOSIS — D471 Chronic myeloproliferative disease: Secondary | ICD-10-CM | POA: Diagnosis not present

## 2024-03-31 DIAGNOSIS — I10 Essential (primary) hypertension: Secondary | ICD-10-CM | POA: Diagnosis not present

## 2024-03-31 DIAGNOSIS — Z89611 Acquired absence of right leg above knee: Secondary | ICD-10-CM | POA: Diagnosis not present

## 2024-03-31 DIAGNOSIS — Z951 Presence of aortocoronary bypass graft: Secondary | ICD-10-CM | POA: Diagnosis not present

## 2024-04-02 ENCOUNTER — Other Ambulatory Visit

## 2024-04-02 ENCOUNTER — Ambulatory Visit: Admitting: Podiatry

## 2024-04-02 ENCOUNTER — Encounter: Payer: Self-pay | Admitting: Podiatry

## 2024-04-02 DIAGNOSIS — Z89611 Acquired absence of right leg above knee: Secondary | ICD-10-CM | POA: Diagnosis not present

## 2024-04-02 DIAGNOSIS — I739 Peripheral vascular disease, unspecified: Secondary | ICD-10-CM

## 2024-04-02 DIAGNOSIS — M79675 Pain in left toe(s): Secondary | ICD-10-CM

## 2024-04-02 DIAGNOSIS — E1151 Type 2 diabetes mellitus with diabetic peripheral angiopathy without gangrene: Secondary | ICD-10-CM

## 2024-04-02 DIAGNOSIS — B351 Tinea unguium: Secondary | ICD-10-CM | POA: Diagnosis not present

## 2024-04-02 NOTE — Progress Notes (Signed)
 This patient returns to my office for at risk foot care.  This patient requires this care by a professional since this patient will be at risk due to having AK amputation right leg, diabetic neuropathy and pvd.This patient is unable to cut nails himself since the patient cannot reach his nails left foot.  .These nails are painful walking and wearing shoes.This patient presents for at risk foot care today.  General Appearance  Alert, conversant and in no acute stress.  Vascular  Dorsalis pedis and posterior tibial  pulses are absent  left foot..  Capillary return is within normal limits  left foot.  .Cold feet. Left foot.  Neurologic  Senn-Weinstein monofilament wire test within normal limits  left  Muscle power within normal limits left .  Nails Thick disfigured discolored nails with subungual debris  from hallux to fifth toes left foot.. No evidence of bacterial infection or drainage bilaterally.  Orthopedic  No limitations of motion  feet .  No crepitus or effusions noted.  No bony pathology or digital deformities noted.AK Amputation right leg.  Skin  normotropic skin with no porokeratosis noted bilaterally.  No signs of infections or ulcers noted.     Onychomycosis   Pain in left toes  Vascular pathology.  Consent was obtained for treatment procedures.   Mechanical debridement of nails 1-5  left performed with a nail nipper.  Filed with dremel without incident.   To receive diabetic shoes next visit.   Return office visit    3 months                  Told patient to return for periodic foot care and evaluation due to potential at risk complications.   Helane Gunther DPM

## 2024-04-06 DIAGNOSIS — N182 Chronic kidney disease, stage 2 (mild): Secondary | ICD-10-CM | POA: Diagnosis not present

## 2024-04-06 DIAGNOSIS — I10 Essential (primary) hypertension: Secondary | ICD-10-CM | POA: Diagnosis not present

## 2024-04-06 DIAGNOSIS — E1140 Type 2 diabetes mellitus with diabetic neuropathy, unspecified: Secondary | ICD-10-CM | POA: Diagnosis not present

## 2024-04-06 DIAGNOSIS — E785 Hyperlipidemia, unspecified: Secondary | ICD-10-CM | POA: Diagnosis not present

## 2024-04-14 ENCOUNTER — Telehealth: Payer: Self-pay | Admitting: Podiatry

## 2024-04-14 NOTE — Telephone Encounter (Signed)
 Patient would like a referral to be sent to [facility] for diabetic shoes.

## 2024-04-17 NOTE — Telephone Encounter (Signed)
 Referral faxed 04/17/2024 [email or web address]

## 2024-05-07 ENCOUNTER — Other Ambulatory Visit: Payer: Self-pay | Admitting: Hematology

## 2024-05-07 DIAGNOSIS — I10 Essential (primary) hypertension: Secondary | ICD-10-CM | POA: Diagnosis not present

## 2024-05-07 DIAGNOSIS — E785 Hyperlipidemia, unspecified: Secondary | ICD-10-CM | POA: Diagnosis not present

## 2024-05-07 DIAGNOSIS — N182 Chronic kidney disease, stage 2 (mild): Secondary | ICD-10-CM | POA: Diagnosis not present

## 2024-05-07 DIAGNOSIS — E1140 Type 2 diabetes mellitus with diabetic neuropathy, unspecified: Secondary | ICD-10-CM | POA: Diagnosis not present

## 2024-05-12 DIAGNOSIS — L853 Xerosis cutis: Secondary | ICD-10-CM | POA: Diagnosis not present

## 2024-05-18 ENCOUNTER — Inpatient Hospital Stay: Payer: No Typology Code available for payment source | Attending: Hematology

## 2024-05-18 ENCOUNTER — Inpatient Hospital Stay (HOSPITAL_BASED_OUTPATIENT_CLINIC_OR_DEPARTMENT_OTHER): Payer: No Typology Code available for payment source | Admitting: Hematology

## 2024-05-18 VITALS — BP 124/70 | HR 75 | Temp 98.4°F | Resp 16 | Wt 145.0 lb

## 2024-05-18 DIAGNOSIS — D649 Anemia, unspecified: Secondary | ICD-10-CM | POA: Insufficient documentation

## 2024-05-18 DIAGNOSIS — D7581 Myelofibrosis: Secondary | ICD-10-CM

## 2024-05-18 DIAGNOSIS — Z7964 Long term (current) use of myelosuppressive agent: Secondary | ICD-10-CM | POA: Insufficient documentation

## 2024-05-18 DIAGNOSIS — I25118 Atherosclerotic heart disease of native coronary artery with other forms of angina pectoris: Secondary | ICD-10-CM

## 2024-05-18 LAB — CBC WITH DIFFERENTIAL (CANCER CENTER ONLY)
Abs Immature Granulocytes: 0.47 K/uL — ABNORMAL HIGH (ref 0.00–0.07)
Basophils Absolute: 0.2 K/uL — ABNORMAL HIGH (ref 0.0–0.1)
Basophils Relative: 1 %
Eosinophils Absolute: 0.3 K/uL (ref 0.0–0.5)
Eosinophils Relative: 1 %
HCT: 29.2 % — ABNORMAL LOW (ref 39.0–52.0)
Hemoglobin: 9.4 g/dL — ABNORMAL LOW (ref 13.0–17.0)
Immature Granulocytes: 2 %
Lymphocytes Relative: 5 %
Lymphs Abs: 1.2 K/uL (ref 0.7–4.0)
MCH: 31.5 pg (ref 26.0–34.0)
MCHC: 32.2 g/dL (ref 30.0–36.0)
MCV: 98 fL (ref 80.0–100.0)
Monocytes Absolute: 1 K/uL (ref 0.1–1.0)
Monocytes Relative: 4 %
Neutro Abs: 21.5 K/uL — ABNORMAL HIGH (ref 1.7–7.7)
Neutrophils Relative %: 87 %
Platelet Count: 426 K/uL — ABNORMAL HIGH (ref 150–400)
RBC: 2.98 MIL/uL — ABNORMAL LOW (ref 4.22–5.81)
RDW: 25.4 % — ABNORMAL HIGH (ref 11.5–15.5)
WBC Count: 24.6 K/uL — ABNORMAL HIGH (ref 4.0–10.5)
nRBC: 0.2 % (ref 0.0–0.2)

## 2024-05-18 LAB — CMP (CANCER CENTER ONLY)
ALT: 5 U/L (ref 0–44)
AST: 12 U/L — ABNORMAL LOW (ref 15–41)
Albumin: 3.8 g/dL (ref 3.5–5.0)
Alkaline Phosphatase: 92 U/L (ref 38–126)
Anion gap: 3 — ABNORMAL LOW (ref 5–15)
BUN: 21 mg/dL (ref 8–23)
CO2: 29 mmol/L (ref 22–32)
Calcium: 8.7 mg/dL — ABNORMAL LOW (ref 8.9–10.3)
Chloride: 107 mmol/L (ref 98–111)
Creatinine: 1.13 mg/dL (ref 0.61–1.24)
GFR, Estimated: >60 mL/min (ref >60)
Glucose, Bld: 110 mg/dL — ABNORMAL HIGH (ref 70–99)
Potassium: 4.4 mmol/L (ref 3.5–5.1)
Sodium: 139 mmol/L (ref 135–145)
Total Bilirubin: 0.5 mg/dL (ref 0.0–1.2)
Total Protein: 5.9 g/dL — ABNORMAL LOW (ref 6.5–8.1)

## 2024-05-18 NOTE — Progress Notes (Signed)
 [facility]   [phone] Fax:([phone]   Clinic Follow up Note   Patient Care Team: Ransom Other, MD as PCP - General (Internal Medicine) Lavona Agent, MD as PCP - Cardiology (Cardiology) Mona Vinie BROCKS, MD as Consulting Physician (Cardiology) Lavona Agent, MD as Consulting Physician (Cardiology) Georjean Darice HERO, MD as Consulting Physician (Neurology) Lanny Callander, MD as Attending Physician (Hematology and Oncology)  Date of Service:  05/18/2024  CHIEF COMPLAINT: f/u of myelofibrosis  CURRENT THERAPY:  Hydrea  1500 mg daily  Oncology History   Myelofibrosis (HCC) JAK2 mutation (+), DIPSS 2, 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 Previously discussed this is not curable but treatable, risk of developing acute leukemia or aplastic anemia later on. -he is not a good candidate for stem cell transplant due to his advanced age and multiple medical comorbidities. -he tried Jakafi for 4 months in 2014, could not tolerate due to his anemia and fatigue. -He is aware of the importance of continued Hydrea  and normalized his blood counts to prevent further stroke or thrombosis.  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 Hemoglobin level is 9.4, slightly decreased from previous level of 10.0, but not at the threshold for requiring a blood transfusion. Continues Hydroxyurea  (Hydrea ) as prescribed, taking all doses at night due to difficulty remembering multiple doses throughout the day. No new symptoms or complications reported. - Continue Hydroxyurea  (Hydrea ) as currently taking. - Perform laboratory tests in three months. - Schedule follow-up appointment in six months.     SUMMARY OF ONCOLOGIC HISTORY: Oncology History  Myelofibrosis (HCC)  12/26/2012 Bone Marrow  Biopsy   Showed hypercellularity with reticulum fibrosis, JAK-2 mutation positive, BCR/ABL negative, 46 XY in 20 metaphase, and Intermediate-1 ,DIPSS of 2. (WBC < 25; Hbg > 10;    01/02/2013 Imaging   US  abdomen complete revealed Mild splenogmealy (13.3 cm) and a 2.2 x 1.7 x 1.8 cm left renal cyst   01/15/2013 Pathology Results   Integrated hematopathology report.  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    03/17/2013 Imaging   CT Chest: Crowded peribronchial markings noted, associated with linear densities involving the left base, probably representing areas of platelike atelectasis/scarring.  No mass or infiltrate seen.    03/19/2013 - 07/20/2013 Chemotherapy   Started Jakafi 20 mg bid. Dose reduced due to anemia.  Stopped due to increasing fatigue while on jakafi.    04/28/2013 - 05/01/2013 Hospital Admission   Admitted to [facility] by Dr. Christal, his cardiologist, due to the fact that he had midsternal chest pain.  He had a coronary stent placed this admission.    05/28/2013 - 05/29/2013 Hospital Admission   Admitted to [facility] and received 3 units of blood (Dr. Hartwell note).    06/10/2013 Surgery   Cololonoscopy due to GIB.  C/w diverticular disease and hemorrhoids but no active bleeding.    10/05/2013 -  Chemotherapy   Started Hydrea  500 mg daily.   03/19/2014 -  Chemotherapy   Hydrea  increased to 500 mg bid wbc 17, hb 17, hct 54, plt 437   06/29/2014 Procedure   cbc shows wbc 18.8 hb 18.7 hct 58.9 plt 456. ?compliance w hydrea  as MCV not elevated. symptomatic start TP weekly x 4  07/28/2014 Procedure   TP every 2 weeks if hct > 45, changed to monthly in April 2016      Discussed the use of AI scribe software for clinical note transcription with the patient, who gave verbal consent to proceed.  History of  Present Illness Tyler Whitehead is an 83 year old male with myelofibrosis who presents for follow-up.  He feels tired this morning, and his hemoglobin level has decreased to 9.4 from a previous level of 10. He is currently taking Hydrea , three pills at night, due to difficulty remembering to take them multiple times a day. He has a sufficient supply of medication and does not require a refill at this time.  He experiences soreness in his leg and foot but denies any open wounds or skin problems. He is still able to take care of himself.     All other systems were reviewed with the patient and are negative.  MEDICAL HISTORY:  Past Medical History:  Diagnosis Date   CAD (coronary artery disease)    a. S/p 3V CABG 2000 with LIMA-LAD, left radial-PDA, SVG-OM1. b. 2014: stent to SVG-OM1; then found to have obstruction distal to LAD anastamosis of LIMA s/p DES.    CHF (congestive heart failure) (HCC)    Diverticular disease    DM (diabetes mellitus) (HCC)    Hemorrhoid    HTN (hypertension)    Hyperlipidemia    Peripheral vascular disease (HCC)    a. R femoropopliteal bypass ~2012 in [place], reportedly shown to be occluded. Now followed by Dr. Court.   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CC)    RBBB     SURGICAL HISTORY: Past Surgical History:  Procedure Laterality Date   ABDOMINAL ANGIOGRAM N/A 01/18/2015   Procedure: ABDOMINAL ANGIOGRAM;  Surgeon: Gaile LELON New, MD;  Location: [facility] CATH LAB;  Service: Cardiovascular;  Laterality: N/A;   ABDOMINAL AORTAGRAM N/A 09/21/2014   Procedure: ABDOMINAL EZELLA;  Surgeon: Carlin FORBES Haddock, MD;  Location: [facility] CATH LAB;  Service: Cardiovascular;  Laterality: N/A;   ABDOMINAL AORTOGRAM W/LOWER EXTREMITY N/A 03/28/2021   Procedure: ABDOMINAL AORTOGRAM W/LOWER EXTREMITY;  Surgeon: New Gaile LELON, MD;  Location: [facility] INVASIVE CV LAB;  Service: Cardiovascular;  Laterality: N/A;   CARDIAC CATHETERIZATION  May 2014   CORONARY ARTERY BYPASS GRAFT  2000   LM occluded,  LIMA to LAD patent but distal native LAD disease, saphenous vein bypass graft to OM with 95% stenosis in the midportion, right coronary artery occluded, radial artery graft to right coronary artery patent but was diffusely small. Marginal graft stented. 7 2014. Of note he had multiple previous stents in the right coronary artery. The distal LAD was treated with angioplasty. I   CORONARY STENT INTERVENTION N/A 01/03/2023   Procedure: CORONARY STENT INTERVENTION;  Surgeon: Swaziland, Peter M, MD;  Location: [facility] INVASIVE CV LAB;  Service: Cardiovascular;  Laterality: N/A;   LEFT HEART CATH AND CORS/GRAFTS ANGIOGRAPHY N/A 01/03/2023   Procedure: LEFT HEART CATH AND CORS/GRAFTS ANGIOGRAPHY;  Surgeon: Swaziland, Peter M, MD;  Location: [facility] INVASIVE CV LAB;  Service: Cardiovascular;  Laterality: N/A;   LOOP RECORDER IMPLANT N/A 07/12/2014   Procedure: LOOP RECORDER IMPLANT;  Surgeon: Danelle LELON Birmingham, MD;  Location: [facility] CATH LAB;  Service: Cardiovascular;  Laterality: N/A;   LOWER EXTREMITY ANGIOGRAM  09/21/2014   Procedure: LOWER EXTREMITY ANGIOGRAM;  Surgeon: Carlin FORBES Haddock, MD;  Location: [facility] CATH LAB;  Service: Cardiovascular;;   PERCUTANEOUS CORONARY STENT INTERVENTION (PCI-S)  April 27 2013   right  leg amputation  2017   TEE WITHOUT CARDIOVERSION N/A 07/12/2014   Procedure: TRANSESOPHAGEAL ECHOCARDIOGRAM (TEE);  Surgeon: Vina Okey GAILS, MD;  Location: [facility] ENDOSCOPY;  Service: Cardiovascular;  Laterality: N/A;   VEIN SURGERY      I have reviewed the social history and family history with the patient and they are unchanged from previous note.  ALLERGIES:  is allergic to other.  MEDICATIONS:  Current Outpatient Medications  Medication Sig Dispense Refill   aspirin  81 MG EC tablet Take 81 mg by mouth daily.     atorvastatin  (LIPITOR) 40 MG tablet Take 40 mg by mouth at bedtime.     carvedilol  (COREG ) 6.25 MG tablet TAKE 1 TABLET TWICE DAILY  WITH MEALS (DOSE INCREASE) 180 tablet 3   cilostazol  (PLETAL ) 100 MG  tablet Take 100 mg by mouth 2 (two) times daily.     clopidogrel  (PLAVIX ) 75 MG tablet Take 1 tablet (75 mg total) by mouth daily. 30 tablet 3   Continuous Glucose Sensor (FREESTYLE LIBRE 3 SENSOR) MISC SMARTSIG:Topical Every 10 Days     doxycycline  (VIBRA -TABS) 100 MG tablet Take 1 tablet (100 mg total) by mouth 2 (two) times daily. 20 tablet 0   fluticasone  (FLONASE ) 50 MCG/ACT nasal spray Place 1 spray into both nostrils daily as needed for allergies.     gabapentin (NEURONTIN) 300 MG capsule Take 300 mg by mouth daily.     hydroxyurea  (HYDREA ) 500 MG capsule TAKE 2 CAPSULES EVERY      MORNING 90 capsule 5   insulin  glargine (LANTUS ) 100 UNIT/ML injection Inject 10 Units into the skin at bedtime.     insulin  lispro (HUMALOG) 100 UNIT/ML injection Inject 10 Units into the skin 2 (two) times daily as needed for high blood sugar.     isosorbide  mononitrate (IMDUR ) 60 MG 24 hr tablet Take 2 tablets (120 mg total) by mouth daily. 180 tablet 2   Lancets (ONETOUCH DELICA PLUS LANCET33G) MISC Apply topically 2 (two) times daily.     levocetirizine (XYZAL) 5 MG tablet Take 5 mg by mouth every evening.     losartan  (COZAAR ) 25 MG tablet Take 25 mg by mouth at bedtime.     nitroGLYCERIN  (NITROSTAT ) 0.4 MG SL tablet Place 1 tablet (0.4 mg total) under the tongue every 5 (five) minutes as needed for chest pain. 75 tablet 1   ONETOUCH VERIO test strip 1 each 2 (two) times daily.     PROAIR  HFA 108 (90 BASE) MCG/ACT inhaler Inhale 1 puff into the lungs every 4 (four) hours as needed for wheezing or shortness of breath.      No current facility-administered medications for this visit.    PHYSICAL EXAMINATION: ECOG PERFORMANCE STATUS: 1 - Symptomatic but completely ambulatory  Vitals:   05/18/24 1402  BP: 124/70  Pulse: 75  Resp: 16  Temp: 98.4 F (36.9 C)  SpO2: 100%   Wt Readings from Last 3 Encounters:  05/18/24 145 lb (65.8 kg)  11/18/23 156 lb 6.4 oz (70.9 kg)  11/01/23 150 lb (68 kg)      GENERAL:alert, no distress and comfortable SKIN: skin color, texture, turgor are normal, no rashes or significant lesions EYES: normal, Conjunctiva are pink and non-injected, sclera clear Musculoskeletal:no cyanosis of digits and no clubbing  NEURO: alert & oriented x 3 with fluent speech, no focal motor/sensory deficits  Physical Exam    LABORATORY DATA:  I have reviewed the data as listed    Latest Ref Rng & Units  05/18/2024    1:49 PM 03/23/2024    1:26 PM 02/10/2024   12:35 PM  CBC  WBC 4.0 - 10.5 K/uL 24.6  5.2  14.8   Hemoglobin 13.0 - 17.0 g/dL 9.4  89.9  89.4   Hematocrit 39.0 - 52.0 % 29.2  29.9  31.8   Platelets 150 - 400 K/uL 426  185  518         Latest Ref Rng & Units 03/23/2024    1:26 PM 02/10/2024   12:35 PM 11/18/2023    1:04 PM  CMP  Glucose 70 - 99 mg/dL 885  788  768   BUN 8 - 23 mg/dL 18  19  15    Creatinine 0.61 - 1.24 mg/dL 9.01  8.85  8.87   Sodium 135 - 145 mmol/L 136  134  139   Potassium 3.5 - 5.1 mmol/L 4.4  4.5  4.1   Chloride 98 - 111 mmol/L 103  103  105   CO2 22 - 32 mmol/L 27  28  31    Calcium 8.9 - 10.3 mg/dL 8.8  8.2  8.7   Total Protein 6.5 - 8.1 g/dL 6.3  5.6  5.8   Total Bilirubin 0.0 - 1.2 mg/dL 0.6  0.6  0.6   Alkaline Phos 38 - 126 U/L 75  93  116   AST 15 - 41 U/L 11  12  11    ALT 0 - 44 U/L 6  6  5        RADIOGRAPHIC STUDIES: I have personally reviewed the radiological images as listed and agreed with the findings in the report. No results found.    No orders of the defined types were placed in this encounter.  All questions were answered. The patient knows to call the clinic with any problems, questions or concerns. No barriers to learning was detected. The total time spent in the appointment was 15 minutes, including review of chart and various tests results, discussions about plan of care and coordination of care plan     Onita Mattock, MD 05/18/2024

## 2024-05-18 NOTE — Assessment & Plan Note (Signed)
 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 Previously discussed this is not curable but treatable, risk of developing acute leukemia or aplastic anemia later on. -he is not a good candidate for stem cell transplant due to his advanced age and multiple medical comorbidities. -he tried Jakafi for 4 months in 2014, could not tolerate due to his anemia and fatigue. -He is aware of the importance of continued Hydrea  and normalized his blood counts to prevent further stroke or thrombosis.  -he continues hydrea , he is taking 1500mg  daily nowl, blood counts stable, will continue at same dose

## 2024-05-24 ENCOUNTER — Other Ambulatory Visit: Payer: Self-pay | Admitting: Hematology

## 2024-06-07 DIAGNOSIS — E785 Hyperlipidemia, unspecified: Secondary | ICD-10-CM | POA: Diagnosis not present

## 2024-06-07 DIAGNOSIS — I10 Essential (primary) hypertension: Secondary | ICD-10-CM | POA: Diagnosis not present

## 2024-06-07 DIAGNOSIS — E1140 Type 2 diabetes mellitus with diabetic neuropathy, unspecified: Secondary | ICD-10-CM | POA: Diagnosis not present

## 2024-06-07 DIAGNOSIS — N182 Chronic kidney disease, stage 2 (mild): Secondary | ICD-10-CM | POA: Diagnosis not present

## 2024-06-30 DIAGNOSIS — I509 Heart failure, unspecified: Secondary | ICD-10-CM | POA: Diagnosis not present

## 2024-06-30 DIAGNOSIS — J441 Chronic obstructive pulmonary disease with (acute) exacerbation: Secondary | ICD-10-CM | POA: Diagnosis not present

## 2024-06-30 DIAGNOSIS — R269 Unspecified abnormalities of gait and mobility: Secondary | ICD-10-CM | POA: Diagnosis not present

## 2024-06-30 DIAGNOSIS — I739 Peripheral vascular disease, unspecified: Secondary | ICD-10-CM | POA: Diagnosis not present

## 2024-06-30 DIAGNOSIS — Z9181 History of falling: Secondary | ICD-10-CM | POA: Diagnosis not present

## 2024-06-30 DIAGNOSIS — Z008 Encounter for other general examination: Secondary | ICD-10-CM | POA: Diagnosis not present

## 2024-07-07 DIAGNOSIS — E1140 Type 2 diabetes mellitus with diabetic neuropathy, unspecified: Secondary | ICD-10-CM | POA: Diagnosis not present

## 2024-07-07 DIAGNOSIS — N182 Chronic kidney disease, stage 2 (mild): Secondary | ICD-10-CM | POA: Diagnosis not present

## 2024-07-07 DIAGNOSIS — I10 Essential (primary) hypertension: Secondary | ICD-10-CM | POA: Diagnosis not present

## 2024-07-07 DIAGNOSIS — E785 Hyperlipidemia, unspecified: Secondary | ICD-10-CM | POA: Diagnosis not present

## 2024-07-09 ENCOUNTER — Ambulatory Visit: Admitting: Podiatry

## 2024-07-13 DIAGNOSIS — E1140 Type 2 diabetes mellitus with diabetic neuropathy, unspecified: Secondary | ICD-10-CM | POA: Diagnosis not present

## 2024-07-13 DIAGNOSIS — D471 Chronic myeloproliferative disease: Secondary | ICD-10-CM | POA: Diagnosis not present

## 2024-07-13 DIAGNOSIS — I10 Essential (primary) hypertension: Secondary | ICD-10-CM | POA: Diagnosis not present

## 2024-07-13 DIAGNOSIS — Z23 Encounter for immunization: Secondary | ICD-10-CM | POA: Diagnosis not present

## 2024-07-13 DIAGNOSIS — G8190 Hemiplegia, unspecified affecting unspecified side: Secondary | ICD-10-CM | POA: Diagnosis not present

## 2024-07-13 DIAGNOSIS — E785 Hyperlipidemia, unspecified: Secondary | ICD-10-CM | POA: Diagnosis not present

## 2024-07-13 DIAGNOSIS — Z8673 Personal history of transient ischemic attack (TIA), and cerebral infarction without residual deficits: Secondary | ICD-10-CM | POA: Diagnosis not present

## 2024-07-13 DIAGNOSIS — I739 Peripheral vascular disease, unspecified: Secondary | ICD-10-CM | POA: Diagnosis not present

## 2024-07-13 DIAGNOSIS — Z89619 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: Secondary | ICD-10-CM | POA: Diagnosis not present

## 2024-07-13 DIAGNOSIS — I25709 Atherosclerosis of coronary artery bypass graft(s), unspecified, with unspecified angina pectoris: Secondary | ICD-10-CM | POA: Diagnosis not present

## 2024-07-13 DIAGNOSIS — R5383 Other fatigue: Secondary | ICD-10-CM | POA: Diagnosis not present

## 2024-08-12 ENCOUNTER — Encounter: Payer: Self-pay | Admitting: Podiatry

## 2024-08-12 ENCOUNTER — Ambulatory Visit (INDEPENDENT_AMBULATORY_CARE_PROVIDER_SITE_OTHER): Admitting: Podiatry

## 2024-08-12 DIAGNOSIS — E1151 Type 2 diabetes mellitus with diabetic peripheral angiopathy without gangrene: Secondary | ICD-10-CM

## 2024-08-12 DIAGNOSIS — Z89611 Acquired absence of right leg above knee: Secondary | ICD-10-CM

## 2024-08-12 DIAGNOSIS — I739 Peripheral vascular disease, unspecified: Secondary | ICD-10-CM

## 2024-08-12 NOTE — Progress Notes (Signed)
 This patient presents to the office for diabetic foot exam. This patient says there is no pain or discomfort in his  feet.  No history of infection or drainage.  This patient presents to the office for foot exam due to having a history of diabetes and amputation.  Vascular  Dorsalis pedis and posterior tibial pulses are absent   B/L.  Capillary return  WNL.  Temperature gradient is  WNL.  Skin turgor  WNL  Sensorium  Senn Weinstein monofilament wire  diminished . Normal tactile sensation.  Nail Exam  Patient has normal nails with no evidence of bacterial or fungal infection.  Orthopedic  Exam  Muscle tone and muscle strength  WNL.  No limitations of motion feet  B/L.  No crepitus or joint effusion noted.  Foot type is unremarkable and digits show no abnormalities.  Bony prominences are unremarkable.  Skin  No open lesions.  Normal skin texture and turgor.   Diabetes with vascular pathology  Diabetic foot exam was performed.  There is evidence of vascular or neurologic pathology.  RTC   Cordella Bold DPM

## 2024-08-17 ENCOUNTER — Encounter: Payer: Self-pay | Admitting: Cardiology

## 2024-08-19 ENCOUNTER — Inpatient Hospital Stay: Attending: Hematology

## 2024-08-19 DIAGNOSIS — D7581 Myelofibrosis: Secondary | ICD-10-CM | POA: Insufficient documentation

## 2024-08-19 DIAGNOSIS — I25118 Atherosclerotic heart disease of native coronary artery with other forms of angina pectoris: Secondary | ICD-10-CM

## 2024-08-19 DIAGNOSIS — D649 Anemia, unspecified: Secondary | ICD-10-CM | POA: Insufficient documentation

## 2024-08-19 LAB — CBC WITH DIFFERENTIAL (CANCER CENTER ONLY)
Abs Immature Granulocytes: 0.06 K/uL (ref 0.00–0.07)
Basophils Absolute: 0.1 K/uL (ref 0.0–0.1)
Basophils Relative: 1 %
Eosinophils Absolute: 0.2 K/uL (ref 0.0–0.5)
Eosinophils Relative: 2 %
HCT: 35 % — ABNORMAL LOW (ref 39.0–52.0)
Hemoglobin: 11.2 g/dL — ABNORMAL LOW (ref 13.0–17.0)
Immature Granulocytes: 1 %
Lymphocytes Relative: 9 %
Lymphs Abs: 0.7 K/uL (ref 0.7–4.0)
MCH: 28 pg (ref 26.0–34.0)
MCHC: 32 g/dL (ref 30.0–36.0)
MCV: 87.5 fL (ref 80.0–100.0)
Monocytes Absolute: 0.1 K/uL (ref 0.1–1.0)
Monocytes Relative: 1 %
Neutro Abs: 6.7 K/uL (ref 1.7–7.7)
Neutrophils Relative %: 86 %
Platelet Count: 331 K/uL (ref 150–400)
RBC: 4 MIL/uL — ABNORMAL LOW (ref 4.22–5.81)
RDW: 19.7 % — ABNORMAL HIGH (ref 11.5–15.5)
Smear Review: NORMAL
WBC Count: 7.8 K/uL (ref 4.0–10.5)
nRBC: 0.3 % — ABNORMAL HIGH (ref 0.0–0.2)

## 2024-08-19 LAB — CMP (CANCER CENTER ONLY)
ALT: 8 U/L (ref 0–44)
AST: 12 U/L — ABNORMAL LOW (ref 15–41)
Albumin: 3.8 g/dL (ref 3.5–5.0)
Alkaline Phosphatase: 89 U/L (ref 38–126)
Anion gap: 5 (ref 5–15)
BUN: 26 mg/dL — ABNORMAL HIGH (ref 8–23)
CO2: 28 mmol/L (ref 22–32)
Calcium: 8.8 mg/dL — ABNORMAL LOW (ref 8.9–10.3)
Chloride: 104 mmol/L (ref 98–111)
Creatinine: 1.09 mg/dL (ref 0.61–1.24)
GFR, Estimated: >60 mL/min (ref >60)
Glucose, Bld: 161 mg/dL — ABNORMAL HIGH (ref 70–99)
Potassium: 4.4 mmol/L (ref 3.5–5.1)
Sodium: 137 mmol/L (ref 135–145)
Total Bilirubin: 0.5 mg/dL (ref 0.0–1.2)
Total Protein: 6.1 g/dL — ABNORMAL LOW (ref 6.5–8.1)

## 2024-08-24 ENCOUNTER — Ambulatory Visit: Payer: Self-pay | Admitting: Hematology

## 2024-10-07 ENCOUNTER — Other Ambulatory Visit: Payer: Self-pay | Admitting: Hematology

## 2024-10-13 NOTE — Progress Notes (Unsigned)
 " Cardiology Office Note:   Date:  10/14/2024  ID:  Tyler Whitehead, DOB 1940-11-08, MRN [id number] PCP: Ransom Other, MD  [facility] Providers Cardiologist:  Lynwood Schilling, MD {  History of Present Illness:   Tyler Whitehead is a 84 y.o. male who presents for follow up of CAD/CABG.  He moved to [place]  and was moving back.   He has CAD he has had CABG and PVD. His last cath [place]  suggested that Medical therapy should be enhanced if the patient has ongoing exertional chest discomfort despite maximal medical management and if he has evidence of inferior ischemia on a perfusion study then repeat PCI of the native right coronary artery could be considered. However, given the patient's track record the right coronary artery is not likely to remain open even if PCI could be accomplished.  He is also managed for lower extremity disease as seen by Dr. Serene.  He has had a CVA of unclear etiology had an implantable loop by Dr. Waddell.  Cardiac catheterization on 05/11/2020 in [place] demonstrated 100% left main and mid RCA in-stent lesions.  LIMA to the LAD apparently was patent collaterals to the right coronary artery.  He had a patent vein graft to an OM 1 with a patent stent in the vein graft.  He had an occluded vein graft to the RCA.  He had 100% left SFA occlusion.  He did have retrograde cannulation of his left tibial peroneal trunk and had this angioplasty.  Carotid Dopplers demonstrated 40 to 50% stenosis on the left.  Echo demonstrated an EF of 60%.  He was continuing to have chest pain and was found on cath early 2024 to have high grade stenosis of a SVG to the OM and had this stented.    He presents for yearly follow up.  He has done quite well since I last saw him.  He gets around if he goes a long distance in his rolling walker but with his lower extremity prosthesis he can get up and maneuver pretty well.  He does take a nitroglycerin  most days if he gets a little chest pain but he says  this has been a very stable pattern.  It happens when he pushes up on his chair.  1 nitroglycerin  takes care of the discomfort.  He is not having any resting symptoms and denies any shortness of breath, PND or orthopnea.  He has no palpitations, presyncope or syncope.  He had no weight gain or edema.    ROS: As stated in the HPI and negative for all other systems.  Studies Reviewed:    EKG:   EKG Interpretation Date/Time:  Wednesday October 14 2024 13:35:13 EST Ventricular Rate:  72 PR Interval:  154 QRS Duration:  124 QT Interval:  390 QTC Calculation: 427 R Axis:   -10  Text Interpretation: Sinus rhythm with Premature atrial complexes Right bundle branch block When compared with ECG of 01-Nov-2023 14:04, No significant change was found Confirmed by Schilling Rattan ([id number]) on 10/14/2024 2:06:12 PM    Risk Assessment/Calculations:              Physical Exam:   VS:  BP (!) 121/55 (BP Location: Right Arm, Patient Position: Sitting, Cuff Size: Normal)   Pulse 72   Ht 5' 7 (1.702 m)   Wt 146 lb (66.2 kg)   SpO2 99%   BMI 22.87 kg/m    Wt Readings from Last 3 Encounters:  10/14/24 146 lb (66.2  kg)  05/18/24 145 lb (65.8 kg)  11/18/23 156 lb 6.4 oz (70.9 kg)     GEN: Well nourished, well developed in no acute distress NECK: No JVD; No carotid bruits CARDIAC: RRR, no murmurs, rubs, gallops RESPIRATORY:  Clear to auscultation without rales, wheezing or rhonchi  ABDOMEN: Soft, non-tender, non-distended EXTREMITIES:  No edema; No deformity   ASSESSMENT AND PLAN:   CAD:   The patient has no new sypmtoms.  No further cardiovascular testing is indicated.  We will continue with aggressive risk reduction and meds as listed.   Hypertension: The blood pressure is at no change in therapy. target.     Hyperlipidemia: LDL was 78 with an HDL of 26.  No change in therapy.  He is followed routinely by [facility].  No change in therapy.   Carotid artery stenosis/PAD:   Type 2 diabetes: A1c was  5.7.  He will continue with the meds as listed.   History of CVA:   He remains on Plavix  and aspirin  with his previous stroke and extensive vascular disease.  I am going to continue both of these as he is have no bleeding issues.  He and I talked about this decision and strategy.  He agrees.   With me in 1 year or sooner if needed.   Follow up with me in one year.   Signed, Lynwood Schilling, MD   "

## 2024-10-14 ENCOUNTER — Ambulatory Visit: Admitting: Cardiology

## 2024-10-14 ENCOUNTER — Encounter: Payer: Self-pay | Admitting: Cardiology

## 2024-10-14 VITALS — BP 121/55 | HR 72 | Ht 67.0 in | Wt 146.0 lb

## 2024-10-14 DIAGNOSIS — I25118 Atherosclerotic heart disease of native coronary artery with other forms of angina pectoris: Secondary | ICD-10-CM | POA: Diagnosis not present

## 2024-10-14 DIAGNOSIS — E118 Type 2 diabetes mellitus with unspecified complications: Secondary | ICD-10-CM | POA: Diagnosis not present

## 2024-10-14 DIAGNOSIS — I10 Essential (primary) hypertension: Secondary | ICD-10-CM

## 2024-10-14 DIAGNOSIS — E785 Hyperlipidemia, unspecified: Secondary | ICD-10-CM | POA: Diagnosis not present

## 2024-10-14 DIAGNOSIS — I739 Peripheral vascular disease, unspecified: Secondary | ICD-10-CM | POA: Diagnosis not present

## 2024-10-14 MED ORDER — NITROGLYCERIN 0.4 MG SL SUBL: 0.4000 mg | SUBLINGUAL_TABLET | SUBLINGUAL | 10 refills | Status: AC | PRN

## 2024-10-14 NOTE — Patient Instructions (Signed)
 Medication Instructions:  Refilled Nitroglycerin  *If you need a refill on your cardiac medications before your next appointment, please call your pharmacy*  Lab Work: NONE If you have labs (blood work) drawn today and your tests are completely normal, you will receive your results only by: MyChart Message (if you have MyChart) OR A paper copy in the mail If you have any lab test that is abnormal or we need to change your treatment, we will call you to review the results.  Testing/Procedures: NONE  Follow-Up: At [facility], you and your health needs are our priority.  As part of our continuing mission to provide you with exceptional heart care, our providers are all part of one team.  This team includes your primary Cardiologist (physician) and Advanced Practice Providers or APPs (Physician Assistants and Nurse Practitioners) who all work together to provide you with the care you need, when you need it.  Your next appointment:   1 year(s)  Provider:   Eilleen Grates, MD    We recommend signing up for the patient portal called "MyChart".  Sign up information is provided on this After Visit Summary.  MyChart is used to connect with patients for Virtual Visits (Telemedicine).  Patients are able to view lab/test results, encounter notes, upcoming appointments, etc.  Non-urgent messages can be sent to your provider as well.   To learn more about what you can do with MyChart, go to [email or web address].

## 2024-10-21 ENCOUNTER — Encounter: Payer: Self-pay | Admitting: Podiatry

## 2024-10-21 ENCOUNTER — Ambulatory Visit: Admitting: Podiatry

## 2024-10-21 DIAGNOSIS — I739 Peripheral vascular disease, unspecified: Secondary | ICD-10-CM

## 2024-10-21 DIAGNOSIS — Z89611 Acquired absence of right leg above knee: Secondary | ICD-10-CM | POA: Diagnosis not present

## 2024-10-21 DIAGNOSIS — E1151 Type 2 diabetes mellitus with diabetic peripheral angiopathy without gangrene: Secondary | ICD-10-CM

## 2024-10-21 NOTE — Progress Notes (Signed)
 This patient presents to the office for diabetic foot exam. This patient says there is no pain or discomfort in his  feet.  No history of infection or drainage.  This patient presents to the office for foot exam due to having a history of diabetes and amputation.  Vascular  Dorsalis pedis and posterior tibial pulses are absent   B/L.  Capillary return  WNL.  Temperature gradient is  WNL.  Skin turgor  WNL  Sensorium  Senn Weinstein monofilament wire  diminished . Normal tactile sensation.  Nail Exam  Patient has normal nails with no evidence of bacterial or fungal infection.  Orthopedic  Exam  Muscle tone and muscle strength  WNL.  No limitations of motion feet  B/L.  No crepitus or joint effusion noted.  Foot type is unremarkable and digits show no abnormalities.  Bony prominences are unremarkable.  Skin  No open lesions.  Normal skin texture and turgor.   Diabetes with vascular pathology  Diabetic foot exam was performed.  There is evidence of vascular or neurologic pathology.  RTC 3 months    Cordella Bold DPM

## 2024-10-30 ENCOUNTER — Other Ambulatory Visit: Payer: Self-pay | Admitting: Cardiology

## 2024-11-11 ENCOUNTER — Other Ambulatory Visit: Payer: Self-pay

## 2024-11-19 ENCOUNTER — Other Ambulatory Visit

## 2024-11-19 ENCOUNTER — Ambulatory Visit: Admitting: Hematology

## 2024-11-26 ENCOUNTER — Inpatient Hospital Stay: Admitting: Hematology

## 2024-11-26 ENCOUNTER — Inpatient Hospital Stay: Attending: Hematology

## 2025-01-19 ENCOUNTER — Ambulatory Visit: Admitting: Podiatry
